# Patient Record
Sex: Male | Born: 1935 | Race: White | Hispanic: No | Marital: Married | Smoking: Never smoker
Health system: Southern US, Community
[De-identification: ages and names within clinical notes are randomized; demographics above are authoritative.]

## PROBLEM LIST (undated history)

## (undated) DIAGNOSIS — E785 Hyperlipidemia, unspecified: Secondary | ICD-10-CM

## (undated) DIAGNOSIS — G459 Transient cerebral ischemic attack, unspecified: Secondary | ICD-10-CM

## (undated) DIAGNOSIS — Z9081 Acquired absence of spleen: Secondary | ICD-10-CM

## (undated) DIAGNOSIS — F419 Anxiety disorder, unspecified: Secondary | ICD-10-CM

## (undated) DIAGNOSIS — I10 Essential (primary) hypertension: Secondary | ICD-10-CM

## (undated) DIAGNOSIS — D693 Immune thrombocytopenic purpura: Secondary | ICD-10-CM

## (undated) DIAGNOSIS — R1310 Dysphagia, unspecified: Secondary | ICD-10-CM

## (undated) DIAGNOSIS — I4821 Permanent atrial fibrillation: Secondary | ICD-10-CM

## (undated) DIAGNOSIS — C679 Malignant neoplasm of bladder, unspecified: Secondary | ICD-10-CM

## (undated) DIAGNOSIS — R310 Gross hematuria: Secondary | ICD-10-CM

## (undated) DIAGNOSIS — D649 Anemia, unspecified: Secondary | ICD-10-CM

## (undated) DIAGNOSIS — J4489 Other specified chronic obstructive pulmonary disease: Secondary | ICD-10-CM

## (undated) DIAGNOSIS — I639 Cerebral infarction, unspecified: Secondary | ICD-10-CM

## (undated) DIAGNOSIS — J90 Pleural effusion, not elsewhere classified: Secondary | ICD-10-CM

## (undated) DIAGNOSIS — J449 Chronic obstructive pulmonary disease, unspecified: Secondary | ICD-10-CM

## (undated) HISTORY — DX: Other specified chronic obstructive pulmonary disease: J44.89

## (undated) HISTORY — DX: Immune thrombocytopenic purpura: D69.3

## (undated) HISTORY — PX: BILATERAL VATS ABLATION: SHX1224

## (undated) HISTORY — DX: Malignant neoplasm of bladder, unspecified: C67.9

## (undated) HISTORY — PX: SPLENECTOMY: SUR1306

## (undated) HISTORY — DX: Pleural effusion, not elsewhere classified: J90

## (undated) HISTORY — DX: Acquired absence of spleen: Z90.81

## (undated) HISTORY — DX: Cerebral infarction, unspecified: I63.9

## (undated) HISTORY — DX: Chronic obstructive pulmonary disease, unspecified: J44.9

## (undated) HISTORY — PX: OTHER SURGICAL HISTORY: SHX169

---

## 1998-06-18 ENCOUNTER — Ambulatory Visit (HOSPITAL_BASED_OUTPATIENT_CLINIC_OR_DEPARTMENT_OTHER): Admission: RE | Admit: 1998-06-18 | Discharge: 1998-06-18 | Payer: Self-pay | Admitting: Plastic Surgery

## 1998-11-14 ENCOUNTER — Ambulatory Visit (HOSPITAL_BASED_OUTPATIENT_CLINIC_OR_DEPARTMENT_OTHER): Admission: RE | Admit: 1998-11-14 | Discharge: 1998-11-14 | Payer: Self-pay | Admitting: Urology

## 1998-12-23 ENCOUNTER — Ambulatory Visit (HOSPITAL_BASED_OUTPATIENT_CLINIC_OR_DEPARTMENT_OTHER): Admission: RE | Admit: 1998-12-23 | Discharge: 1998-12-23 | Payer: Self-pay | Admitting: Plastic Surgery

## 2000-08-04 ENCOUNTER — Encounter: Admission: RE | Admit: 2000-08-04 | Discharge: 2000-08-04 | Payer: Self-pay | Admitting: Urology

## 2000-08-04 ENCOUNTER — Encounter: Payer: Self-pay | Admitting: Urology

## 2000-08-05 ENCOUNTER — Ambulatory Visit (HOSPITAL_BASED_OUTPATIENT_CLINIC_OR_DEPARTMENT_OTHER): Admission: RE | Admit: 2000-08-05 | Discharge: 2000-08-05 | Payer: Self-pay | Admitting: Urology

## 2001-02-06 ENCOUNTER — Inpatient Hospital Stay (HOSPITAL_COMMUNITY): Admission: EM | Admit: 2001-02-06 | Discharge: 2001-02-08 | Payer: Self-pay | Admitting: Emergency Medicine

## 2001-02-13 ENCOUNTER — Ambulatory Visit (HOSPITAL_COMMUNITY): Admission: RE | Admit: 2001-02-13 | Discharge: 2001-02-13 | Payer: Self-pay | Admitting: Cardiology

## 2001-02-27 ENCOUNTER — Inpatient Hospital Stay (HOSPITAL_COMMUNITY): Admission: AD | Admit: 2001-02-27 | Discharge: 2001-03-02 | Payer: Self-pay | Admitting: Internal Medicine

## 2001-02-28 ENCOUNTER — Encounter: Payer: Self-pay | Admitting: Internal Medicine

## 2001-03-06 ENCOUNTER — Encounter (INDEPENDENT_AMBULATORY_CARE_PROVIDER_SITE_OTHER): Payer: Self-pay | Admitting: *Deleted

## 2001-03-06 ENCOUNTER — Encounter: Payer: Self-pay | Admitting: Pulmonary Disease

## 2001-03-06 ENCOUNTER — Inpatient Hospital Stay (HOSPITAL_COMMUNITY): Admission: AD | Admit: 2001-03-06 | Discharge: 2001-03-17 | Payer: Self-pay | Admitting: Critical Care Medicine

## 2001-03-07 ENCOUNTER — Encounter: Payer: Self-pay | Admitting: Pulmonary Disease

## 2001-03-08 ENCOUNTER — Encounter: Payer: Self-pay | Admitting: Pulmonary Disease

## 2001-03-09 ENCOUNTER — Encounter: Payer: Self-pay | Admitting: Pulmonary Disease

## 2001-03-10 ENCOUNTER — Encounter: Payer: Self-pay | Admitting: Critical Care Medicine

## 2001-03-10 ENCOUNTER — Encounter: Payer: Self-pay | Admitting: Thoracic Surgery

## 2001-03-11 ENCOUNTER — Encounter: Payer: Self-pay | Admitting: Thoracic Surgery

## 2001-03-12 ENCOUNTER — Encounter: Payer: Self-pay | Admitting: Pulmonary Disease

## 2001-03-13 ENCOUNTER — Encounter: Payer: Self-pay | Admitting: Pulmonary Disease

## 2001-03-14 ENCOUNTER — Encounter: Payer: Self-pay | Admitting: Thoracic Surgery

## 2001-03-15 ENCOUNTER — Encounter: Payer: Self-pay | Admitting: Critical Care Medicine

## 2001-03-16 ENCOUNTER — Encounter: Payer: Self-pay | Admitting: Critical Care Medicine

## 2001-03-16 ENCOUNTER — Encounter: Payer: Self-pay | Admitting: Pulmonary Disease

## 2001-03-17 ENCOUNTER — Encounter: Payer: Self-pay | Admitting: Thoracic Surgery

## 2001-03-22 ENCOUNTER — Encounter: Admission: RE | Admit: 2001-03-22 | Discharge: 2001-03-22 | Payer: Self-pay | Admitting: Thoracic Surgery

## 2001-03-22 ENCOUNTER — Encounter: Payer: Self-pay | Admitting: Thoracic Surgery

## 2001-04-05 ENCOUNTER — Encounter: Admission: RE | Admit: 2001-04-05 | Discharge: 2001-04-05 | Payer: Self-pay | Admitting: Thoracic Surgery

## 2001-04-05 ENCOUNTER — Encounter: Payer: Self-pay | Admitting: Thoracic Surgery

## 2001-05-03 ENCOUNTER — Encounter: Payer: Self-pay | Admitting: Thoracic Surgery

## 2001-05-03 ENCOUNTER — Encounter: Admission: RE | Admit: 2001-05-03 | Discharge: 2001-05-03 | Payer: Self-pay | Admitting: Thoracic Surgery

## 2001-08-01 ENCOUNTER — Encounter: Admission: RE | Admit: 2001-08-01 | Discharge: 2001-08-01 | Payer: Self-pay | Admitting: Thoracic Surgery

## 2001-08-01 ENCOUNTER — Encounter: Payer: Self-pay | Admitting: Thoracic Surgery

## 2001-08-04 ENCOUNTER — Ambulatory Visit (HOSPITAL_COMMUNITY): Admission: RE | Admit: 2001-08-04 | Discharge: 2001-08-04 | Payer: Self-pay | Admitting: Urology

## 2001-08-04 ENCOUNTER — Encounter (INDEPENDENT_AMBULATORY_CARE_PROVIDER_SITE_OTHER): Payer: Self-pay | Admitting: Specialist

## 2002-08-01 ENCOUNTER — Encounter: Admission: RE | Admit: 2002-08-01 | Discharge: 2002-08-01 | Payer: Self-pay | Admitting: Thoracic Surgery

## 2002-08-01 ENCOUNTER — Encounter: Payer: Self-pay | Admitting: Thoracic Surgery

## 2003-07-16 ENCOUNTER — Ambulatory Visit (HOSPITAL_BASED_OUTPATIENT_CLINIC_OR_DEPARTMENT_OTHER): Admission: RE | Admit: 2003-07-16 | Discharge: 2003-07-16 | Payer: Self-pay | Admitting: Urology

## 2003-07-16 ENCOUNTER — Encounter (INDEPENDENT_AMBULATORY_CARE_PROVIDER_SITE_OTHER): Payer: Self-pay

## 2004-10-27 ENCOUNTER — Ambulatory Visit: Payer: Self-pay

## 2004-11-25 ENCOUNTER — Ambulatory Visit: Payer: Self-pay | Admitting: Cardiology

## 2004-12-22 ENCOUNTER — Ambulatory Visit: Payer: Self-pay | Admitting: Cardiology

## 2005-01-19 ENCOUNTER — Ambulatory Visit: Payer: Self-pay | Admitting: Cardiology

## 2005-02-16 ENCOUNTER — Ambulatory Visit: Payer: Self-pay | Admitting: *Deleted

## 2005-03-16 ENCOUNTER — Ambulatory Visit: Payer: Self-pay | Admitting: Internal Medicine

## 2005-04-13 ENCOUNTER — Ambulatory Visit: Payer: Self-pay | Admitting: Cardiology

## 2005-05-10 ENCOUNTER — Ambulatory Visit: Payer: Self-pay | Admitting: Cardiology

## 2005-06-08 ENCOUNTER — Ambulatory Visit: Payer: Self-pay | Admitting: Cardiovascular Disease

## 2005-07-06 ENCOUNTER — Ambulatory Visit: Payer: Self-pay | Admitting: *Deleted

## 2005-08-03 ENCOUNTER — Ambulatory Visit: Payer: Self-pay | Admitting: Cardiology

## 2005-08-11 ENCOUNTER — Ambulatory Visit: Payer: Self-pay | Admitting: Cardiology

## 2005-08-11 ENCOUNTER — Ambulatory Visit: Payer: Self-pay | Admitting: Internal Medicine

## 2005-08-26 ENCOUNTER — Ambulatory Visit: Payer: Self-pay

## 2005-09-07 ENCOUNTER — Ambulatory Visit: Payer: Self-pay | Admitting: *Deleted

## 2005-09-13 ENCOUNTER — Ambulatory Visit: Payer: Self-pay | Admitting: Internal Medicine

## 2005-10-07 ENCOUNTER — Ambulatory Visit: Payer: Self-pay | Admitting: Cardiology

## 2005-11-02 ENCOUNTER — Ambulatory Visit: Payer: Self-pay | Admitting: Cardiology

## 2005-11-30 ENCOUNTER — Ambulatory Visit: Payer: Self-pay | Admitting: Cardiology

## 2005-12-28 ENCOUNTER — Ambulatory Visit: Payer: Self-pay | Admitting: *Deleted

## 2006-01-25 ENCOUNTER — Ambulatory Visit: Payer: Self-pay | Admitting: Cardiology

## 2006-02-22 ENCOUNTER — Ambulatory Visit: Payer: Self-pay | Admitting: Cardiovascular Disease

## 2006-03-15 ENCOUNTER — Ambulatory Visit: Payer: Self-pay | Admitting: Internal Medicine

## 2006-04-12 ENCOUNTER — Ambulatory Visit: Payer: Self-pay | Admitting: Cardiology

## 2006-05-06 ENCOUNTER — Encounter (INDEPENDENT_AMBULATORY_CARE_PROVIDER_SITE_OTHER): Payer: Self-pay | Admitting: Specialist

## 2006-05-06 ENCOUNTER — Ambulatory Visit (HOSPITAL_BASED_OUTPATIENT_CLINIC_OR_DEPARTMENT_OTHER): Admission: RE | Admit: 2006-05-06 | Discharge: 2006-05-06 | Payer: Self-pay | Admitting: Urology

## 2006-05-10 ENCOUNTER — Ambulatory Visit: Payer: Self-pay | Admitting: Internal Medicine

## 2006-05-10 ENCOUNTER — Inpatient Hospital Stay (HOSPITAL_COMMUNITY): Admission: EM | Admit: 2006-05-10 | Discharge: 2006-05-14 | Payer: Self-pay | Admitting: Emergency Medicine

## 2006-05-17 ENCOUNTER — Ambulatory Visit: Payer: Self-pay | Admitting: *Deleted

## 2006-05-31 ENCOUNTER — Ambulatory Visit: Payer: Self-pay | Admitting: Internal Medicine

## 2006-06-14 ENCOUNTER — Ambulatory Visit: Payer: Self-pay | Admitting: Cardiology

## 2006-06-18 ENCOUNTER — Emergency Department (HOSPITAL_COMMUNITY): Admission: EM | Admit: 2006-06-18 | Discharge: 2006-06-18 | Payer: Self-pay | Admitting: Family Medicine

## 2006-06-20 ENCOUNTER — Ambulatory Visit: Payer: Self-pay | Admitting: Internal Medicine

## 2006-06-22 ENCOUNTER — Encounter (HOSPITAL_COMMUNITY): Admission: RE | Admit: 2006-06-22 | Discharge: 2006-09-20 | Payer: Self-pay | Admitting: Urology

## 2006-07-12 ENCOUNTER — Ambulatory Visit: Payer: Self-pay | Admitting: Cardiovascular Disease

## 2006-08-02 ENCOUNTER — Ambulatory Visit: Payer: Self-pay | Admitting: Cardiology

## 2006-08-25 ENCOUNTER — Ambulatory Visit: Payer: Self-pay | Admitting: Cardiology

## 2006-09-20 ENCOUNTER — Ambulatory Visit: Payer: Self-pay | Admitting: Cardiovascular Disease

## 2006-10-18 ENCOUNTER — Ambulatory Visit: Payer: Self-pay | Admitting: Cardiology

## 2006-11-15 ENCOUNTER — Ambulatory Visit: Payer: Self-pay | Admitting: Internal Medicine

## 2006-12-13 ENCOUNTER — Ambulatory Visit: Payer: Self-pay | Admitting: Cardiology

## 2007-01-10 ENCOUNTER — Ambulatory Visit: Payer: Self-pay | Admitting: Cardiology

## 2007-02-07 ENCOUNTER — Ambulatory Visit: Payer: Self-pay | Admitting: Cardiology

## 2007-02-28 ENCOUNTER — Ambulatory Visit: Payer: Self-pay | Admitting: Cardiology

## 2007-03-28 ENCOUNTER — Ambulatory Visit: Payer: Self-pay | Admitting: Internal Medicine

## 2007-04-25 ENCOUNTER — Ambulatory Visit: Payer: Self-pay | Admitting: Cardiology

## 2007-05-16 ENCOUNTER — Ambulatory Visit: Payer: Self-pay | Admitting: Cardiology

## 2007-06-13 ENCOUNTER — Ambulatory Visit: Payer: Self-pay | Admitting: Internal Medicine

## 2007-07-11 ENCOUNTER — Ambulatory Visit: Payer: Self-pay | Admitting: Cardiology

## 2007-08-03 ENCOUNTER — Ambulatory Visit: Payer: Self-pay | Admitting: Internal Medicine

## 2007-08-29 ENCOUNTER — Ambulatory Visit: Payer: Self-pay | Admitting: Internal Medicine

## 2007-09-20 ENCOUNTER — Emergency Department (HOSPITAL_COMMUNITY): Admission: EM | Admit: 2007-09-20 | Discharge: 2007-09-20 | Payer: Self-pay | Admitting: Family Medicine

## 2007-09-26 ENCOUNTER — Ambulatory Visit: Payer: Self-pay | Admitting: Cardiology

## 2007-10-24 ENCOUNTER — Ambulatory Visit: Payer: Self-pay | Admitting: Cardiology

## 2007-11-21 ENCOUNTER — Ambulatory Visit: Payer: Self-pay | Admitting: Internal Medicine

## 2007-12-19 ENCOUNTER — Ambulatory Visit: Payer: Self-pay | Admitting: Cardiology

## 2008-01-18 ENCOUNTER — Ambulatory Visit: Payer: Self-pay | Admitting: Cardiology

## 2008-02-13 ENCOUNTER — Ambulatory Visit: Payer: Self-pay | Admitting: Cardiology

## 2008-02-23 ENCOUNTER — Encounter (INDEPENDENT_AMBULATORY_CARE_PROVIDER_SITE_OTHER): Payer: Self-pay | Admitting: Urology

## 2008-02-23 ENCOUNTER — Ambulatory Visit (HOSPITAL_BASED_OUTPATIENT_CLINIC_OR_DEPARTMENT_OTHER): Admission: RE | Admit: 2008-02-23 | Discharge: 2008-02-23 | Payer: Self-pay | Admitting: Urology

## 2008-03-06 ENCOUNTER — Ambulatory Visit: Payer: Self-pay | Admitting: Cardiology

## 2008-04-02 ENCOUNTER — Ambulatory Visit: Payer: Self-pay | Admitting: Cardiology

## 2008-04-30 ENCOUNTER — Ambulatory Visit: Payer: Self-pay | Admitting: Cardiovascular Disease

## 2008-05-28 ENCOUNTER — Ambulatory Visit: Payer: Self-pay | Admitting: Cardiovascular Disease

## 2008-06-13 ENCOUNTER — Ambulatory Visit: Payer: Self-pay | Admitting: Cardiology

## 2008-06-23 ENCOUNTER — Inpatient Hospital Stay (HOSPITAL_COMMUNITY): Admission: EM | Admit: 2008-06-23 | Discharge: 2008-06-24 | Payer: Self-pay | Admitting: Family Medicine

## 2008-06-23 ENCOUNTER — Ambulatory Visit: Payer: Self-pay | Admitting: Cardiology

## 2008-06-24 ENCOUNTER — Encounter: Payer: Self-pay | Admitting: Cardiology

## 2008-07-02 ENCOUNTER — Ambulatory Visit: Payer: Self-pay | Admitting: Cardiology

## 2008-07-25 ENCOUNTER — Ambulatory Visit: Payer: Self-pay | Admitting: Internal Medicine

## 2008-08-20 ENCOUNTER — Ambulatory Visit: Payer: Self-pay | Admitting: Cardiology

## 2009-02-24 ENCOUNTER — Encounter: Payer: Self-pay | Admitting: Internal Medicine

## 2009-03-31 ENCOUNTER — Ambulatory Visit: Payer: Self-pay | Admitting: Cardiovascular Disease

## 2009-03-31 ENCOUNTER — Inpatient Hospital Stay (HOSPITAL_COMMUNITY): Admission: EM | Admit: 2009-03-31 | Discharge: 2009-04-04 | Payer: Self-pay | Admitting: Emergency Medicine

## 2009-03-31 ENCOUNTER — Ambulatory Visit: Payer: Self-pay | Admitting: Internal Medicine

## 2009-04-01 ENCOUNTER — Encounter (INDEPENDENT_AMBULATORY_CARE_PROVIDER_SITE_OTHER): Payer: Self-pay | Admitting: Internal Medicine

## 2009-04-04 ENCOUNTER — Encounter: Payer: Self-pay | Admitting: Internal Medicine

## 2009-04-04 ENCOUNTER — Telehealth (INDEPENDENT_AMBULATORY_CARE_PROVIDER_SITE_OTHER): Payer: Self-pay | Admitting: *Deleted

## 2009-04-10 ENCOUNTER — Ambulatory Visit: Payer: Self-pay | Admitting: Internal Medicine

## 2009-04-10 DIAGNOSIS — C679 Malignant neoplasm of bladder, unspecified: Secondary | ICD-10-CM | POA: Insufficient documentation

## 2009-04-10 DIAGNOSIS — D693 Immune thrombocytopenic purpura: Secondary | ICD-10-CM | POA: Insufficient documentation

## 2009-04-10 DIAGNOSIS — J449 Chronic obstructive pulmonary disease, unspecified: Secondary | ICD-10-CM

## 2009-04-10 DIAGNOSIS — D649 Anemia, unspecified: Secondary | ICD-10-CM

## 2009-04-10 DIAGNOSIS — Z8679 Personal history of other diseases of the circulatory system: Secondary | ICD-10-CM | POA: Insufficient documentation

## 2009-04-10 DIAGNOSIS — I10 Essential (primary) hypertension: Secondary | ICD-10-CM | POA: Insufficient documentation

## 2009-04-10 DIAGNOSIS — F411 Generalized anxiety disorder: Secondary | ICD-10-CM | POA: Insufficient documentation

## 2009-04-10 DIAGNOSIS — J4489 Other specified chronic obstructive pulmonary disease: Secondary | ICD-10-CM | POA: Insufficient documentation

## 2009-04-10 DIAGNOSIS — E785 Hyperlipidemia, unspecified: Secondary | ICD-10-CM | POA: Insufficient documentation

## 2009-04-10 HISTORY — DX: Anemia, unspecified: D64.9

## 2009-11-10 ENCOUNTER — Encounter: Payer: Self-pay | Admitting: Internal Medicine

## 2011-02-09 ENCOUNTER — Ambulatory Visit (HOSPITAL_BASED_OUTPATIENT_CLINIC_OR_DEPARTMENT_OTHER)
Admission: RE | Admit: 2011-02-09 | Discharge: 2011-02-09 | Disposition: A | Discharge status: Home / Self Care | Payer: MEDICARE | Source: Ambulatory Visit | Attending: Orthopedic Surgery | Admitting: Orthopedic Surgery

## 2011-02-09 DIAGNOSIS — S61209A Unspecified open wound of unspecified finger without damage to nail, initial encounter: Secondary | ICD-10-CM | POA: Insufficient documentation

## 2011-02-09 DIAGNOSIS — Z1833 Retained wood fragments: Secondary | ICD-10-CM | POA: Insufficient documentation

## 2011-02-16 NOTE — Op Note (Signed)
Leonard, [name] NO.:  [id number]  MEDICAL RECORD NO.:  [id number]          PATIENT TYPE:  LOCATION:                                 FACILITY:  PHYSICIAN:  [name]. [name], M.D.      DATE OF BIRTH:  DATE OF PROCEDURE:  02/09/2011 DATE OF DISCHARGE:                              OPERATIVE REPORT   PREOPERATIVE DIAGNOSIS:  Abscess, left index finger pulp due to wood splinter 1 week prior.  POSTOPERATIVE DIAGNOSIS:  Abscess, left index finger pulp due to wood splinter 1 week prior.  OPERATION: 1. Incision and drainage of pulp abscess, left index finger. 2. Removal of treated wood splinter and placement of a Xeroflo drain,     left index finger pulp.  OPERATING SURGEON:  [name]. Leonard Magnussen, MD.  ASSISTANT:  [name], PAC.  ANESTHESIA:  Lidocaine 2% metacarpal head level block, left index finger with 5 mL of plain 2% lidocaine.  This is a minor operating room procedure.  INDICATIONS:  The patient is a well-known gentleman, who was building a bridge with pressure treated wood at home.  While passing his hand over a rough cut of lumber, he got a large splinter into his left index finger pulp.  He tried to remove this, but was unsuccessful.  He subsequently developed significant swelling and pain and saw Dr. Venancio [name] of [facility].  Dr. Nicholas [name] numbed the finger, tried to remove the splinter.  Unfortunately, the patient continued to have significant pain, swelling, and signs of a probable abscess.  Therefore, Dr. Nicholas [name] contacted orthopedic and hand specialist and requested a consult.  The patient was seen on February 08, 2011 and noted to have the pulp abscess.  He had been treated with Keflex 500 mg p.o. q.8 h; however, he was not tolerating generic Keflex due to GI upset.  We advised him to hold the Keflex, begin soaks, and present for incision and drainage at this time with anticipated removal of the  splinter.  After informed consent, he was brought to the operating room.  PROCEDURE IN DETAIL:  The patient was brought to room 8 of the [facility] and placed in supine position on the operating table.  Following informed consent and alcohol Betadine prep, 2% lidocaine was infiltrated at the metacarpal head level to obtain a digital block.  After 5 minutes, excellent anesthesia was achieved.  The patient's left arm and hand were prepped with Betadine soap and solution, sterilely draped.  The finger was elevated and a gauze wrap was used for exsanguination.  A 1/2-inch Penrose drain was placed to the proximal phalangeal segment as a digital tourniquet.  Once anesthesia was confirmed to be satisfactorily, we performed a 2-cm midline incision in the pulp.  Immediately, pus under pressure was encountered.  We gently spread the pulp, relieving a sizable abscess extending down to the flexor tendon insertion.  I then identified a 12 mm x 2 mm x 1.5 mm wood splinter that appear to be pressure treated. This was removed.  The patient identified this as the that wood he was using.  I asked him if he thought he had more than one splinter.  He was not certain.  I diligently searched the pulp and could not find a second one.  I palpated with a [name] and also could not feel a foreign body.  Experienced surgeons understand that  foreign body identification particularly in the setting of an abscess can be very challenging.  To the best of our abilities, we have explored the fingertip.  The abscess cavity was then packed with Xeroflo, followed by [name] dressing, sterile gauze, and a Coban finger dressing and secured with a wrist wrap.  For aftercare, the patient is provided prescriptions for tramadol 50 mg 1-2 tablets p.o. q.4-6 hours p.r.[name]. pain, 20 tablets with one refill. Also he has doxycycline 100 mg p.o. b.i.d. x7 days.     [name] [name], M.D.     RVS/MEDQ  D:   02/09/2011  T:  02/10/2011  Job:  [id number]  cc:   Leonard Poisson, MD  Electronically Signed by [name] M.D. on 02/16/2011 08:23:03 AM

## 2011-03-15 ENCOUNTER — Other Ambulatory Visit: Payer: Self-pay | Admitting: Dermatology

## 2011-03-30 ENCOUNTER — Inpatient Hospital Stay (HOSPITAL_COMMUNITY)
Admission: EM | Admit: 2011-03-30 | Discharge: 2011-03-31 | DRG: 309 | Disposition: A | Discharge status: Home / Self Care | Payer: Medicare Other | Attending: Family Medicine | Admitting: Family Medicine

## 2011-03-30 ENCOUNTER — Inpatient Hospital Stay (INDEPENDENT_AMBULATORY_CARE_PROVIDER_SITE_OTHER)
Admission: RE | Admit: 2011-03-30 | Discharge: 2011-03-30 | Disposition: A | Discharge status: Home / Self Care | Payer: MEDICARE | Source: Ambulatory Visit

## 2011-03-30 ENCOUNTER — Emergency Department (HOSPITAL_COMMUNITY): Payer: Medicare Other

## 2011-03-30 DIAGNOSIS — I4891 Unspecified atrial fibrillation: Secondary | ICD-10-CM

## 2011-03-30 DIAGNOSIS — G459 Transient cerebral ischemic attack, unspecified: Secondary | ICD-10-CM | POA: Diagnosis present

## 2011-03-30 DIAGNOSIS — Z7901 Long term (current) use of anticoagulants: Secondary | ICD-10-CM

## 2011-03-30 DIAGNOSIS — R42 Dizziness and giddiness: Secondary | ICD-10-CM | POA: Diagnosis present

## 2011-03-30 DIAGNOSIS — R279 Unspecified lack of coordination: Secondary | ICD-10-CM | POA: Diagnosis present

## 2011-03-30 DIAGNOSIS — R4789 Other speech disturbances: Secondary | ICD-10-CM | POA: Diagnosis present

## 2011-03-30 DIAGNOSIS — C679 Malignant neoplasm of bladder, unspecified: Secondary | ICD-10-CM | POA: Diagnosis present

## 2011-03-30 DIAGNOSIS — Z8673 Personal history of transient ischemic attack (TIA), and cerebral infarction without residual deficits: Secondary | ICD-10-CM

## 2011-03-30 LAB — CBC
Hemoglobin: 13.3 g/dL (ref 13.0–17.0)
MCH: 34.3 pg — ABNORMAL HIGH (ref 26.0–34.0)
MCV: 98.5 fL (ref 78.0–100.0)
RBC: 3.88 MIL/uL — ABNORMAL LOW (ref 4.22–5.81)
RDW: 13.9 % (ref 11.5–15.5)

## 2011-03-30 LAB — DIFFERENTIAL
Basophils Absolute: 0 10*3/uL (ref 0.0–0.1)
Eosinophils Relative: 1 % (ref 0–5)
Lymphs Abs: 1.4 10*3/uL (ref 0.7–4.0)
Neutrophils Relative %: 68 % (ref 43–77)

## 2011-03-30 LAB — BASIC METABOLIC PANEL
GFR calc Af Amer: >60 mL/min (ref >60)
GFR calc non Af Amer: >60 mL/min (ref >60)
Sodium: 137 mEq/L (ref 135–145)

## 2011-03-30 LAB — APTT: aPTT: 33 seconds (ref 24–37)

## 2011-03-30 LAB — POCT CARDIAC MARKERS
CKMB, poc: 1.5 ng/mL (ref 1.0–8.0)
Troponin i, poc: <0.05 ng/mL (ref 0.00–0.09)

## 2011-03-30 LAB — CARDIAC PANEL(CRET KIN+CKTOT+MB+TROPI)
Relative Index: INVALID (ref 0.0–2.5)
Total CK: 52 U/L (ref 7–232)

## 2011-03-30 LAB — PROTIME-INR: Prothrombin Time: 22.7 seconds — ABNORMAL HIGH (ref 11.6–15.2)

## 2011-03-31 ENCOUNTER — Inpatient Hospital Stay (HOSPITAL_COMMUNITY): Payer: Medicare Other

## 2011-03-31 ENCOUNTER — Other Ambulatory Visit (HOSPITAL_COMMUNITY): Payer: MEDICARE

## 2011-03-31 DIAGNOSIS — I059 Rheumatic mitral valve disease, unspecified: Secondary | ICD-10-CM

## 2011-03-31 LAB — CARDIAC PANEL(CRET KIN+CKTOT+MB+TROPI)
Relative Index: INVALID (ref 0.0–2.5)
Relative Index: INVALID (ref 0.0–2.5)
Total CK: 47 U/L (ref 7–232)
Total CK: 40 U/L (ref 7–232)
Troponin I: 0.01 ng/mL (ref 0.00–0.06)

## 2011-03-31 LAB — BASIC METABOLIC PANEL
BUN: 18 mg/dL (ref 6–23)
CO2: 27 mEq/L (ref 19–32)
Chloride: 102 mEq/L (ref 96–112)
Creatinine, Ser: 1.09 mg/dL (ref 0.4–1.5)
Glucose, Bld: 87 mg/dL (ref 70–99)
Potassium: 4.1 mEq/L (ref 3.5–5.1)
Sodium: 137 mEq/L (ref 135–145)

## 2011-04-04 NOTE — Discharge Summary (Signed)
[name], [name] NO.:  [id number]  MEDICAL RECORD NO.:  [id number]           PATIENT TYPE:  I  LOCATION:  2032                         FACILITY:  [facility]  PHYSICIAN:  Lonia Blood, M.D.       DATE OF BIRTH:  03-02-1927  DATE OF ADMISSION:  03/30/2011 DATE OF DISCHARGE:  03/31/2011                              DISCHARGE SUMMARY   PRIMARY CARE PHYSICIAN:  Corwin Levins, MD  DISCHARGE DIAGNOSES: 1. Vertigo/transient ischemic attack. 2. History of remote lacunar strokes at least 3 of them. 3. Atrial fibrillation with rapid ventricular response. 4. Hypertension. 5. History of pleural effusion status post video-assisted     thoracoscopic surgery in 2002. 6. History of idiopathic thrombocytopenic purpura status post     splenectomy. 7. Bladder cancer on ongoing local therapy by [name] at [facility].  DISCHARGE MEDICATIONS: 1. Diltiazem 240 mg daily. 2. Coumadin as before. 3. Vitamin C 1 tablet daily. 4. Vitamin D 1000 units twice a day.  CONDITION ON DISCHARGE:  [name] was discharged in good condition. At the time of discharge, temperature 97.6, pulse 88, respirations 18, blood pressure 124/72, saturation 99% on room air.  He was alert, oriented, and able to ambulate around the room without assistance.  He was instructed to follow up with his primary care physician, [name].  I have recommended to the patient that he also follows up with Cardiology and [name] is going to help facilitate that visit.  PROCEDURE DURING THIS ADMISSION: 1. The patient underwent an MRI of the brain without contrast which     was negative for acute strokes, global atrophy and scattered small     vessel disease type changes, remote bilateral caudate, head and     superior left ventricular infarcts. 2. MRA of the brain showing the major intracranial vascular structures     are patent. 3. Carotid ultrasounds which  were negative for significant     extracranial carotid artery stenosis. 4. March 31, 2011, echocardiogram ejection fraction found to be 45-50%,     systolic artery pressures 39 mmHg.  HISTORY AND PHYSICAL:  Refer to dictated H and P done by Dr. Lonia Blood.  HOSPITAL COURSE:  [name] is an 77 year old gentleman with known hypertension, old strokes and atrial fibrillation presented to the emergency room with complaints of ataxia and vertigo.  He was also found to be in atrial fibrillation with rapid ventricular response to 140s. The patient was admitted to hospital on the telemetry unit and started on intravenous Cardizem drip.  He had frequent neurological checks and had an MRI of the brain which demonstrated that he has not suffered a new stroke.  The patient's vertigo improved after his atrial fibrillation and ventricular transmission was controlled.  On a Cardizem drip, the patient's heart rate got into the 80s.  He was  transitioned to oral 240 mg of diltiazem which he tolerated well with good blood pressure and good heart rate.  He was recommended to continue this dose of Cardizem and continue  taking Coumadin.  I have also recommended the patient consider switching to Xarelto and he also follows up with Cardiology.  Otherwise, the patient should follow up with his primary care physician for the other risk factor modification including a check of a hemoglobin A1c and fasting lipid panel.  Reviewing the records, it looks like he is overdue on both of those.     Lonia Blood, M.D.     SL/MEDQ  D:  04/01/2011  T:  04/01/2011  Job:  [id number]  cc:   Corwin Levins, MD  Electronically Signed by Lonia Blood M.D. on 04/04/2011 10:25:29 AM

## 2011-04-07 ENCOUNTER — Ambulatory Visit (INDEPENDENT_AMBULATORY_CARE_PROVIDER_SITE_OTHER): Payer: MEDICARE | Admitting: Internal Medicine

## 2011-04-07 ENCOUNTER — Encounter: Payer: Self-pay | Admitting: Internal Medicine

## 2011-04-07 DIAGNOSIS — J90 Pleural effusion, not elsewhere classified: Secondary | ICD-10-CM | POA: Insufficient documentation

## 2011-04-07 DIAGNOSIS — Z8679 Personal history of other diseases of the circulatory system: Secondary | ICD-10-CM

## 2011-04-07 DIAGNOSIS — Z9081 Acquired absence of spleen: Secondary | ICD-10-CM | POA: Insufficient documentation

## 2011-04-07 DIAGNOSIS — I6381 Other cerebral infarction due to occlusion or stenosis of small artery: Secondary | ICD-10-CM | POA: Insufficient documentation

## 2011-04-07 DIAGNOSIS — C679 Malignant neoplasm of bladder, unspecified: Secondary | ICD-10-CM

## 2011-04-07 DIAGNOSIS — I10 Essential (primary) hypertension: Secondary | ICD-10-CM

## 2011-04-07 DIAGNOSIS — I4891 Unspecified atrial fibrillation: Secondary | ICD-10-CM

## 2011-04-07 DIAGNOSIS — Z7901 Long term (current) use of anticoagulants: Secondary | ICD-10-CM | POA: Insufficient documentation

## 2011-04-07 HISTORY — DX: Acquired absence of spleen: Z90.81

## 2011-04-07 LAB — SODIUM, URINE, RANDOM: Sodium, Ur: 28 mEq/L

## 2011-04-07 LAB — COMPREHENSIVE METABOLIC PANEL
AST: 22 U/L (ref 0–37)
CO2: 25 mEq/L (ref 19–32)
Calcium: 7.7 mg/dL — ABNORMAL LOW (ref 8.4–10.5)
Creatinine, Ser: 1.13 mg/dL (ref 0.4–1.5)
GFR calc Af Amer: >60 mL/min (ref >60)
GFR calc non Af Amer: >60 mL/min (ref >60)
Total Protein: 5.5 g/dL — ABNORMAL LOW (ref 6.0–8.3)

## 2011-04-07 LAB — BASIC METABOLIC PANEL
BUN: 19 mg/dL (ref 6–23)
Chloride: 103 mEq/L (ref 96–112)
Glucose, Bld: 96 mg/dL (ref 70–99)
Potassium: 4.4 mEq/L (ref 3.5–5.1)

## 2011-04-07 LAB — PROTIME-INR
INR: 2.3 — ABNORMAL HIGH (ref 0.00–1.49)
INR: 2.5 — ABNORMAL HIGH (ref 0.00–1.49)
INR: 2.6 — ABNORMAL HIGH (ref 0.00–1.49)
INR: 3.2 — ABNORMAL HIGH (ref 0.00–1.49)
INR: 3.5 — ABNORMAL HIGH (ref 0.00–1.49)
Prothrombin Time: 27.1 s — ABNORMAL HIGH (ref 11.6–15.2)
Prothrombin Time: 28.4 s — ABNORMAL HIGH (ref 11.6–15.2)
Prothrombin Time: 29.9 s — ABNORMAL HIGH (ref 11.6–15.2)
Prothrombin Time: 38.1 s — ABNORMAL HIGH (ref 11.6–15.2)

## 2011-04-07 LAB — CBC
HCT: 32.1 % — ABNORMAL LOW (ref 39.0–52.0)
HCT: 34 % — ABNORMAL LOW (ref 39.0–52.0)
Hemoglobin: 10.9 g/dL — ABNORMAL LOW (ref 13.0–17.0)
MCHC: 33.6 g/dL (ref 30.0–36.0)
MCV: 101.7 fL — ABNORMAL HIGH (ref 78.0–100.0)
MCV: 102.9 fL — ABNORMAL HIGH (ref 78.0–100.0)
Platelets: 194 10*3/uL (ref 150–400)
Platelets: 206 10*3/uL (ref 150–400)
RBC: 3.16 MIL/uL — ABNORMAL LOW (ref 4.22–5.81)
RBC: 4.17 MIL/uL — ABNORMAL LOW (ref 4.22–5.81)
RDW: 13.9 % (ref 11.5–15.5)
WBC: 13.8 10*3/uL — ABNORMAL HIGH (ref 4.0–10.5)

## 2011-04-07 LAB — DIFFERENTIAL
Basophils Absolute: 0 10*3/uL (ref 0.0–0.1)
Basophils Relative: 0 % (ref 0–1)
Eosinophils Absolute: 0 10*3/uL (ref 0.0–0.7)
Eosinophils Absolute: 0 10*3/uL (ref 0.0–0.7)
Lymphs Abs: 0.5 10*3/uL — ABNORMAL LOW (ref 0.7–4.0)
Lymphs Abs: 0.6 10*3/uL — ABNORMAL LOW (ref 0.7–4.0)
Neutro Abs: 12.5 10*3/uL — ABNORMAL HIGH (ref 1.7–7.7)
Neutro Abs: 9 10*3/uL — ABNORMAL HIGH (ref 1.7–7.7)
Neutrophils Relative %: 90 % — ABNORMAL HIGH (ref 43–77)

## 2011-04-07 LAB — POCT I-STAT, CHEM 8
Calcium, Ion: 0.87 mmol/L — ABNORMAL LOW (ref 1.12–1.32)
Creatinine, Ser: 1.4 mg/dL (ref 0.4–1.5)
Glucose, Bld: 103 mg/dL — ABNORMAL HIGH (ref 70–99)
Hemoglobin: 16 g/dL (ref 13.0–17.0)
Potassium: 4.9 mEq/L (ref 3.5–5.1)
TCO2: 26 mmol/L (ref 0–100)

## 2011-04-07 LAB — EXPECTORATED SPUTUM ASSESSMENT W GRAM STAIN, RFLX TO RESP C

## 2011-04-07 LAB — CULTURE, BLOOD (ROUTINE X 2): Culture: NO GROWTH

## 2011-04-07 LAB — TROPONIN I: Troponin I: <0.01 ng/mL (ref 0.00–0.06)

## 2011-04-07 LAB — LIPID PANEL
LDL Cholesterol: 52 mg/dL (ref 0–99)
Triglycerides: 34 mg/dL (ref <150)
VLDL: 7 mg/dL (ref 0–40)

## 2011-04-07 LAB — URINALYSIS, ROUTINE W REFLEX MICROSCOPIC
Leukocytes, UA: NEGATIVE
Nitrite: NEGATIVE
Protein, ur: 30 mg/dL — AB
Specific Gravity, Urine: 1.02 (ref 1.005–1.030)
Urobilinogen, UA: 1 mg/dL (ref 0.0–1.0)

## 2011-04-07 LAB — CULTURE, RESPIRATORY W GRAM STAIN: Culture: NORMAL

## 2011-04-07 LAB — CARDIAC PANEL(CRET KIN+CKTOT+MB+TROPI)
CK, MB: 0.8 ng/mL (ref 0.3–4.0)
CK, MB: 1 ng/mL (ref 0.3–4.0)
Relative Index: INVALID (ref 0.0–2.5)
Relative Index: INVALID (ref 0.0–2.5)
Total CK: 30 U/L (ref 7–232)
Total CK: 35 U/L (ref 7–232)

## 2011-04-07 LAB — CK TOTAL AND CKMB (NOT AT ARMC)
Relative Index: INVALID (ref 0.0–2.5)
Total CK: 34 U/L (ref 7–232)

## 2011-04-07 LAB — POCT CARDIAC MARKERS: CKMB, poc: 1.3 ng/mL (ref 1.0–8.0)

## 2011-04-07 LAB — LEGIONELLA ANTIGEN, URINE

## 2011-04-07 LAB — URINE MICROSCOPIC-ADD ON

## 2011-04-07 LAB — CREATININE, URINE, RANDOM: Creatinine, Urine: 113.5 mg/dL

## 2011-04-07 NOTE — Assessment & Plan Note (Signed)
Also for referral to coumadin clinic  [place]

## 2011-04-07 NOTE — Assessment & Plan Note (Signed)
stable overall by hx and exam, most recent lab reviewed with pt, and pt to continue medical treatment as before, though I think he may benefit from anti-plt in addition to coumadin, Continue all other medications as before for now

## 2011-04-07 NOTE — Patient Instructions (Signed)
Continue all other medications as before You will be contacted regarding the referral for: coumadin clinic, and Cardiology (Dr Mariah Milling) at [facility]

## 2011-04-07 NOTE — Assessment & Plan Note (Signed)
Currently stable HR and BP, tolerating med well, volume status stable, and will refer to card as reqeusted, Continue all other medications as before

## 2011-04-07 NOTE — Assessment & Plan Note (Signed)
stable overall by hx and exam, most recent lab reviewed with pt, and pt to continue medical treatment as before  BP Readings from Last 3 Encounters:  04/07/11 100/70  04/10/09 124/62

## 2011-04-07 NOTE — Progress Notes (Signed)
Subjective:    Patient ID: [name], male    DOB: August 19, 1927, 77 y.o.   MRN: [id number]  HPI 77 yo WM last seen approx 18 mo ago, here with wife to f/u post hospn for afib/rvr with diltiazem increased from 120 to 240 mg;  Vertigo/TIA and MIR c/w several remote lacunar infarcts, overall doing well today;  He is very wary of continued use of the dilt at 240 mg, b/c at one point he had 180 reduced per Dr Graciela Husbands to 120 mg for ? Side effect, but seems to be tolerating the 240 mg well, in fact HR and BP at home daily per wife have been normal;  Pt without further dizziness/vertigo and back to baseline activity level and speaks of building a wooden bridge across a stream on his property;  Pt denies chest pain, increased sob or doe, wheezing, orthopnea, PND, increased LE swelling, palpitations, dizziness or syncope.  Pt denies new neurological symptoms such as new headache, or facial or extremity weakness or numbness   Pt denies polydipsia, polyuria.  Denies worsening depressive symptoms, suicidal ideation, or panic.  Overall good compliance with treatment, and good medicine tolerability.  Pt denies fever, wt loss, night sweats, loss of appetite, or other constitutional symptoms  Wife mentions he needs assist with referral to new cardiologist and coumadin clinic, preferably in [place] as they live in [place].  Also mentions he plans to cont to f/u with Dr Darvin Neighbours [facility] urology (has seen for over 20 yrs);  Had been planned for recurrent bladder cancer surgury, but wife called to put this off for now, until can be cleared by cardiology.  He has several bladder cysto/surgury and has always come off coumadin 3 days ahead, then resumed usual coumadin post-procedure the same day.  Has had no clinical cva symptoms to account for the lacunar infarcts found on MRI, timing of infarcts unknown if occurred while off the coumadin. Past Medical History  Diagnosis Date  . NEOPLASM, MALIGNANT, BLADDER 04/10/2009  .  HYPERLIPIDEMIA 04/10/2009  . ANEMIA-NOS 04/10/2009  . 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 ANXIETY 04/10/2009  . HYPERTENSION 04/10/2009  . Atrial fibrillation 04/10/2009  . COPD 04/10/2009  . TRANSIENT ISCHEMIC ATTACK, HX OF 04/10/2009  . Bladder cancer     Dr. Darvin Neighbours  . Lacunar stroke 04/07/2011  . Pleural effusion 04/07/2011  . 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 Splenectomy   . Bilateral vats ablation   . Facial cancer     facial skin cancer    reports that he has never smoked. He does not have any smokeless tobacco history on file. He reports that he does not drink alcohol. His drug history not on file. family history includes Arthritis in his father and Heart disease in his mother. Allergies  Allergen Reactions  . Diazepam     REACTION: agitation  . Ezetimibe-Simvastatin   . Morphine   . Sulfonamide Derivatives     REACTION: rash   Current Outpatient Prescriptions on File Prior to Visit  Medication Sig Dispense Refill  . Alum & Mag Hydroxide-Simeth (MAGIC MOUTHWASH) SOLN Take by mouth. Swish and spit 5 cc by mouth four times daily       . warfarin (COUMADIN) 2.5 MG tablet Take 2.5 mg by mouth. Use as directed       . DISCONTD: diltiazem (DILACOR XR) 120 MG 24 hr capsule Take 120 mg by mouth daily.  Review of Systems Review of Systems  Constitutional: Negative for diaphoresis and unexpected weight change.  HENT: Negative for drooling and tinnitus.   Eyes: Negative for photophobia and visual disturbance.  Respiratory: Negative for choking and stridor.   Gastrointestinal: Negative for vomiting and blood in stool.  Genitourinary: Negative for hematuria and decreased urine volume.  Musculoskeletal: Negative for gait problem.  Skin: Negative for color change and wound.  Neurological: Negative for tremors and numbness.  Psychiatric/Behavioral: Negative for decreased concentration. The patient is not hyperactive.         Objective:   Physical ExamBP 100/70  Pulse 75  Temp(Src) 97.5 F (36.4 C) (Oral)  Ht 5\' 8"  (1.727 m)  Wt 134 lb 4 oz (60.895 kg)  BMI 20.41 kg/m2  SpO2 98% Physical Exam  VS noted Constitutional: Pt appears well-developed and well-nourished.  HENT: Head: Normocephalic.  Right Ear: External ear normal.  Left Ear: External ear normal.  Eyes: Conjunctivae and EOM are normal. Pupils are equal, round, and reactive to light.  Neck: Normal range of motion. Neck supple.  Cardiovascular: Normal rate and regular rhythm.   Pulmonary/Chest: Effort normal and breath sounds normal.  Abd:  Soft, NT, non-distended, + BS Neurological: Pt is alert. No cranial nerve deficit.  Skin: Skin is warm. No erythema.  Psychiatric: Pt behavior is normal. Thought content normal.  1+ nervous         Assessment & Plan:

## 2011-04-07 NOTE — Assessment & Plan Note (Signed)
Agree pt should hold for a few wks anyway further attempt at bladder surgury, and with next procedure I suggested he consider bridge therapy with lovenox, though he is not wanting to consider this for now

## 2011-04-12 NOTE — H&P (Addendum)
NAMEIGNATZ, DEIS NO.:  [id number]  MEDICAL RECORD NO.:  [id number]           PATIENT TYPE:  LOCATION:                                 FACILITY:  PHYSICIAN:  Lonia Blood, M.D.       DATE OF BIRTH:  [birth year]  DATE OF ADMISSION:  03/30/2011 DATE OF DISCHARGE:                             HISTORY & PHYSICAL   PRIMARY CARE PROVIDER:  Dr. Oliver Barre of [place].  CARDIOLOGIST:  Duke Salvia, MD, FACC  NEUROLOGIST:  Pramod P. Pearlean Brownie, MD  UROLOGIST:  Lucrezia Starch. Earlene Plater, MD  The patient is being admitted to [facility] Team #5.  CHIEF COMPLAINT:  Vertigo.  HISTORY OF PRESENT ILLNESS:  [name] is a very pleasant 77 year old male with a history of AFib, hypertension, bladder cancer, TIA, who presents to the [facility] ED with chief complaint of vertigo.  Information is obtained from the patient.  He states that last 7 days or so, he developed intermittent vertigo.  He reports that typically he would awaken and feel fine and as the day progressed he developed intermittent vertigo that was worse at night.  He states that he would lie down and the room would be spinning.  He would lie still until it stopped.  He did not have trouble sleeping and when he awakened he felt fine.  This has been going on for the last week or so until yesterday, the vertigo worsened to the point that he was unable to ambulate.  He states that he saw his cardiologist yesterday for a routine followup at which point assessment of his ears was done and the ear exam was benign.  He denies headache, visual disturbances, numbness, tingling of extremities.  He denies chest pain, palpitation, change in mentation, difficulty swallowing, or slurred speech.  He denies weakness in his lower extremities.  He indicates that he was just unable to ambulate due to the vertigo.  This morning, he called his PCP who recommended that he come to the emergency room.  Workup in the emergency room  yields the patient in AFib with rapid ventricular response at a rate of 148.  A Cardizem drip was started in the emergency room.  Symptoms came on gradually, have persisted and worsened.  Movement makes symptoms worse. Lying still makes them better.  Symptoms are characterized as moderate. We are asked to admit for further evaluation and treatment.  ALLERGIES:  The patient indicates an allergy to: 1. SULFA, causes hives. 2. VALIUM causes the patient to climb the walls. 3. MORPHINE causes hallucinations. 4. VYTORIN intolerance in the past.  PAST MEDICAL HISTORY: 1. History of chronic AFib/flutter status post TEE with cardioversion     at least 4 times in the past. 2. History of pleural effusion status post thoracentesis in 2002     complicated by pneumothorax, resolved with the chest tube. 3. 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 Edwyna Shell. 4. History of TIA in 2007. 5. Hypertension. 6. History of idiopathic thrombocytopenic purpura status post     splenectomy. 7. History of bladder cancer with multiple procedures.  The patient     also reports being treated with interferon.  Has an appointment     with Dr. Earlene Plater next week.  MEDICATIONS: 1. Vitamin C over-the-counter p.o. 1 tablet daily. 2. Vitamin D3 2000 units 0.5 tablets p.o. b.i.d. 3. Coumadin 5 mg p.o. 0.5-1 tablet daily, 1 tablet on Monday and 0.5     tablets all the other days. 4. Diltiazem CD/XT 120 mg p.o. daily.  FAMILY HISTORY:  Reviewed with the patient.  Noncontributory to the admission of this elderly gentleman.  SOCIAL HISTORY:  The patient is married and lives with his wife and has done so for the last 52 years.  He is retired from [employer]. He denies tobacco use.  Denies EtOH.  Denies drug use.  REVIEW OF SYSTEMS:  GENERAL:  Negative for fever, chills, anorexia, unintentional weight loss.  ENT:  Negative for ear pain, nasal congestion, sore throat.  CV:  Negative for chest pain,  palpitation, lower extremity edema.  RESPIRATORY:  Negative for increased work of breathing or cough.  MUSCULOSKELETAL:  Negative for joint pain, muscle weakness.  NEURO:  See HPI.  GI:  Negative for nausea, vomiting, abdominal pain, constipation, diarrhea, melena.  GU:  Positive for frequency.  Negative for dysuria, hematuria.  PSYCH:  Negative for depression, anxiety.  HEME:  Negative for unusual bruising or bleeding.  LABORATORY DATA:  WBC is 6.5, hemoglobin 13.3, hematocrit 38.2, platelets 139.  Sodium 137, potassium 4.2, chloride 100, CO2 of 28, BUN 25, creatinine 1.07, glucose 92.  PTT 33, PT 22.7, INR 1.98.  Total CK 53, CK-MB 2.0, troponin-I 0.01, second set is CK-MB 1.5, troponin-I less than 0.05, myoglobin 59.3. Radiology, chest x-ray yields COPD.  No active lung disease. CT of the head, no acute abnormality.  Atrophy, chronic small vessel white matter ischemic changes and old lacunar infarcts. EKG yields AFib.  PHYSICAL EXAMINATION:  VITAL SIGNS:  Temperature is 98.0, blood pressure 124/77, heart rate 90, respiration 18, sats 95% on room air. GENERAL:  Awake, alert, well-nourished, well-hydrated, no acute distress. HEENT:  Head normocephalic, atraumatic.  Pupils equal, round, reactive to light.  EOMI.  Mucous membranes of his mouth are moist and pink.  No obvious lesion or exudate in his nose or ears. NECK:  Supple.  No JVD.  Full range of motion.  No lymphadenopathy. CV:  Irregularly irregular rhythm.  No murmur, gallop, or rub.  No lower extremity edema.  Pedal pulses present and palpable. RESPIRATORY:  No increased work of breathing.  Breath sounds clear to auscultation bilaterally.  No rhonchi, wheezes, or rales. ABDOMEN:  Flat, soft, positive bowel sounds throughout, nontender to palpation.  No mass or organomegaly noted. NEURO:  Alert and oriented x3.  Speech clear.  Facial symmetry.  Cranial nerves II-XII grossly intact. MUSCULOSKELETAL:  Moves all extremities.  No  joint swelling/erythema. EXTREMITIES:  Without clubbing or cyanosis.  ASSESSMENT AND PLAN: 1. Vertigo, etiology unclear.  Nonspecific peripheral versus central.     We will admit to tele.  We will get MRI of brain.  We will request     PT consult.  If above ruled out, may need to consider vestibular     neuritis and to give prednisone. 2. Atrial fibrillation with rapid ventricular response, likely     exacerbated by vertigo.  Has been on Cardizem for 10 years and     chronic Coumadin.  We will continue Cardizem drip started in the ED     once rate  controlled.  We will convert to p.o.  We will add a beta-     blocker and gently fluid resuscitate.  We will ask pharmacy to dose     Coumadin. 3. Hypertension.  Blood pressure is currently 142/94.  We will add a     beta-blocker and monitor closely. 4. History of transient ischemic attack.  We will get an MRI of the     brain and 2-D echo and give aspirin. 5. History of bladder cancer.  The patient has a followup appointment     with Dr. Earlene Plater in 2 weeks. 6. History of idiopathic thrombocytic purpura.  Currently at baseline. 7. Deep vein thrombosis prophylaxis.  The patient is on Coumadin. 8. Code status.  The patient is a full code.  This assessment and plan was discussed with Dr. Lavera Guise.  It was truly a pleasure taking care of [name].     Gwenyth Bender, NP   ______________________________ Lonia Blood, M.D.    KMB/MEDQ  D:  03/30/2011  T:  03/30/2011  Job:  [id number]  Electronically Signed by Lonia Blood M.D. on 04/11/2011 09:51:59 AM Electronically Signed by Toya Smothers  on 04/13/2011 10:26:51 AM

## 2011-04-21 ENCOUNTER — Encounter: Payer: MEDICARE | Admitting: Emergency Medicine

## 2011-05-11 NOTE — Discharge Summary (Signed)
NAMERIGGIN, CUTTINO NO.:  [id number]   MEDICAL RECORD NO.:  [id number]          PATIENT TYPE:  INP   LOCATION:  2029                         FACILITY:  [facility]   PHYSICIAN:  Bruce Rexene Edison. Swords, MD    DATE OF BIRTH:  Oct 06, 1927   DATE OF ADMISSION:  03/31/2009  DATE OF DISCHARGE:  04/04/2009                               DISCHARGE SUMMARY   DISCHARGE DIAGNOSES:  1. Right lower lobe pneumonia (positive strep urinary antigen),      improving.  2. Atrial fibrillation with rapid ventricular rate (increased      diltiazem).  3. History of transient ischemic attack.  4. History of idiopathic thrombocytopenic purpura.  5. History of bladder cancer followed by Dr. Earlene Plater.  6. Dyslipidemia with intolerance to STATINS.  7. History of anemia.   DISCHARGE MEDICATIONS:  See medication reconciliation sheet.   HOSPITAL PROCEDURES:  Chest x-ray on March 31, 2009, demonstrated COPD  with suspicion of right lower lobe pneumonia.   HOSPITAL LABORATORIES:  Protime on February 04, 2009, demonstrated INR  2.5.  Respiratory culture unremarkable.  Blood cultures negative to  date.   CBC on April 02, 2009, with hemoglobin of 10.9, white count 7.7, and  platelet count 227,000.  Cardiac panels unremarkable.  Legionella  antigen negative.  TSH 1.317.  Lipid panel with cholesterol 94 and HDL  of 35.  Strep pneumo urinary antigen was positive.   CONDITION ON DISCHARGE:  Improved, ambulating in the halls without  difficulty, tolerating a diet.   FOLLOWUP PLANS:  Dr. Shelva Majestic next week.  He is to have his protime  checked on Monday, April 07, 2009, and tell Dr. Shelva Majestic that he has been  taking Avelox.   Follow up with Dr. [name] 1 week.   HOSPITAL COURSE:  Pneumonia.  The patient presented with heart disease,  generalized weakness, shortness of breath, and fever.  He was treated as  an outpatient by his cardiologist with a Z-Pak.  The patient had really  no significant improvement.  The  patient came to the hospital.   Right lower lobe pneumonia.  Positive urinary strep antigen.  The  patient treated aggressively with antibiotics and is tolerating Avelox.  He will complete another 3 days.   Atrial fibrillation with rapid ventricular response.  Likely exacerbated  by current symptoms.  Cardizem will be increased.  The patient will  follow up with Dr. Shelva Majestic.   Other medical problems include history of ITP.  Platelets are stable.   History of bladder cancer followed by Dr. Earlene Plater.   Anemia has been mild.   Leukocytosis, chronic likely exacerbated by recent prednisone use.   All the patient's questions were answered at the time of discharge.  Greater than 30 minutes spent in discharge planning.      Bruce Rexene Edison Swords, MD  Electronically Signed     BHS/MEDQ  D:  04/04/2009  T:  04/04/2009  Job:  [id number]

## 2011-05-11 NOTE — Discharge Summary (Signed)
NAMEJAMERE, [name] NO.:  [id number]   MEDICAL RECORD NO.:  [id number]          PATIENT TYPE:  INP   LOCATION:  3703                         FACILITY:  [facility]   PHYSICIAN:  Duke Salvia, MD, FACCDATE OF BIRTH:  1927/05/08   DATE OF ADMISSION:  06/23/2008  DATE OF DISCHARGE:  06/24/2008                               DISCHARGE SUMMARY   PRIMARY FINAL DISCHARGE DIAGNOSIS:  Atrial fibrillation with rapid  ventricular response.   SECONDARY DIAGNOSES:  1. History of transient ischemic attack in 2007.  2. Chronic anticoagulation with Coumadin.  3. History of atrial flutter.  4. History of pleural effusions, treated with thoracentesis, resulted      in pneumothorax and chest tube.  5. Hypertension.  6. History of idiopathic thrombocytopenic purpura, status post      omentectomy.  7. Bladder cancer with multiple procedures.   ALLERGY OR INTOLERANCE:  VYTORIN, SULFA, PREDNISONE, VALIUM, and  MORPHINE.   TIME AT DISCHARGE:  41 minutes.   HOSPITAL COURSE:  [name] is a 77 year old male with a history of  atrial fibrillation.  He had some dizziness and came to the hospital  where his blood pressure was 140/90 and his heart rate was greater than  110.  He was admitted for further evaluation.   [name] had a chest x-ray that showed new air space disease on the  right lower lobe suggestive of early pneumonia.  However, he was  afebrile and his white count was within normal limit.  He was not  coughing.  He did not feel that he needed antibiotics.   Cardiac enzymes were negative for MI.  His INR was slightly  subtherapeutic at 1.8 and he is encouraged to increase his Coumadin  slightly, but he doubts he will do so.  Dr. Graciela Husbands discussed the  situation with him at length on June, 29, 2009.  He has a reported  history of mild cardiomyopathy, as his EF was reported to be about 40%.  [name] prefers to have further evaluation done as an outpatient.  Dr.  Graciela Husbands  therefore considered him stable for discharge, as his  symptoms had resolved and he agrees to follow up with Dr. Shelva Majestic with  an echocardiogram.  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 says  he would like to make no med changes at this time.  He feels he is  getting significant side effects with both the Cardizem and the Coumadin  and is therefore reluctant to change the doses.   DISCHARGE INSTRUCTIONS:  1. His activity level is to be as tolerated.  2. He is encouraged to stick to a low fat and salt diet.  3. He is to follow up with Dr. Shelva Majestic and get an echocardiogram on      July 23, 2008, at 10:00.  4. He is to get a Coumadin checked [facility] on July 02, 2008, at 09:15.  5. He is to follow up with Dr. [name], Dr. Graciela Husbands,  Dr. Pearlean Brownie, and Dr.      Earlene Plater as needed.   DISCHARGE MEDICATIONS:  1. Cardizem CD 120 mg daily.  2. Coumadin 2.5 mg daily, except for 5 mg is recommended on 2 days a      week instead of the 1 day a week, he was taking prior to admission.   LABORATORY DATA:  Hemoglobin 13.6, hematocrit 40.1, and WBCs 3.9.      Theodore Demark, PA-C      Duke Salvia, MD, [facility]  Electronically Signed    RB/MEDQ  D:  06/24/2008  T:  06/24/2008  Job:  [id number]

## 2011-05-11 NOTE — Assessment & Plan Note (Signed)
[facility]                            CARDIOLOGY OFFICE NOTE   NAME:[name], [name]                         MRN:          [id number]  DATE:03/06/2008                            DOB:          [birth year]    PRIMARY CARDIOLOGIST:  Duke Salvia, MD, F.A.C.C.   This is a 77 year old white male patient of Dr. Graciela Husbands, who has a history  of chronic atrial fibrillation with rapid ventricular rate.  He has been  reluctant to increase his medications in the past and accepts that his  heart rate runs between 80 and 150.  He has not been seen here since  2006, although he was seen in the hospital in 2007 with a questionable  TIA and had his Coumadin adjusted.   He is here today only because he was told he needed to come in for  followup.  He says he feels better than he has in a long time.  He  denies any chest pain, palpitations, dyspnea, dyspnea on exertion,  dizziness or presyncope.  His last 2D echo he had here in 2006 showed an  ejection fraction of 40%, but he says he had a friend perform one last  year in [facility] and his ejection fraction was between 45 and 50%.  He  also sought someone in [place] who does alternative medicine.  He  talks about taking CoQ10 to stimulate his heart and prevent any  cardiomyopathy, as well as wanting to take creatine to increase his  muscle mass.  He is reluctant to adjust his Diltiazem or take a beta  blocker for rate control.  He says, when he is at complete rest, his  heart rate goes down to 80, but generally it stays in the 110-120 range  and gets up as high as 150.   CURRENT MEDICATIONS:  1. Diltiazem 120 mg daily.  2. Coumadin as directed.  3. D3 1000 units a half a day.   PHYSICAL EXAM:  This is a thin, 77 year old white male, in no acute  distress.  Blood pressure 138/74, pulse 128, weight 136.  NECK:  Without JVD, HJR, bruit or thyroid enlargement.  LUNGS:  Clear, anterior, posterior and lateral.  HEART:   Irregularly irregular at 130 beats per minute.  Normal S1 and S2  with a 1/6 systolic ejection murmur at the left sternal border.  ABDOMEN:  Soft without organomegaly, masses, lesions or abnormal  tenderness.  EXTREMITIES:  Without cyanosis, clubbing or edema.  He has good distal  pulses.   EKG:  Atrial fibrillation with rapid ventricular rate at 128 beats per  minute.  Nonspecific STT-wave changes.   IMPRESSION:  1. Chronic atrial fibrillation with rapid ventricular rate.  2. Left ventricular dysfunction.  Ejection fraction 40% on echo in      2006.  He claims an echo by a friend was done last year, EF 45-50%.  3. Hypertension.  4. History of idiopathic thrombocytopenia, status post splenectomy.  5. Questionable TIA in 2007.  6. History of hydrothorax, status post pleurodesis.  7. History of bladder tumors.  8. Coumadin therapy.   PLAN AT THIS TIME:  I have asked the patient to double up on his  Diltiazem for better rate control.  I do not believe he will do this.  I  explained the risk of cardiomyopathy with permanent tachycardia, but he  seems willing to take this risk and thinks he can prevent it with  alternative medicine.  He has an appointment with the Coumadin clinic  today and will see Dr. Graciela Husbands back in six months.      Jacolyn Reedy, PA-C  Electronically Signed      Arturo Morton. Riley Kill, MD, [facility]  Electronically Signed   ML/MedQ  DD: 03/06/2008  DT: 03/06/2008  Job #: [id number]

## 2011-05-11 NOTE — H&P (Signed)
NAMENORMA, IGNASIAK NO.:  [id number]   MEDICAL RECORD NO.:  [id number]          PATIENT TYPE:  INP   LOCATION:  3703                         FACILITY:  [facility]   PHYSICIAN:  Luis Abed, MD, FACCDATE OF BIRTH:  08-Apr-1927   DATE OF ADMISSION:  06/23/2008  DATE OF DISCHARGE:                              HISTORY & PHYSICAL   PRIMARY CARDIOLOGIST:  Duke Salvia, MD, [facility]   PRIMARY CARE PHYSICIAN:  Corwin Levins, MD   NEUROLOGIST:  Pramod P. Pearlean Brownie, MD   UROLOGIST:  Lucrezia Starch. Earlene Plater, MD   The patient is a friend of Dr. Tia Masker.   HISTORY OF PRESENT ILLNESS:  Mr. Edmondson is a 77 year old Caucasian  gentleman who presented to [facility] today at the  assistance of his family after he experienced a dizzy episode.  Mr.  Chilton Si has had atrial fib for many years, status post multiple attempted  cardioversions with temporary return to normal sinus rhythm.  Also, he  has a history of reluctance to adjust his diltiazem or to take a beta-  blocker for rate control.  This apparently has been a big issue.  Please  refer to previous office notes for further information on this.  Mr.  Chilton Si was in his usual state of health this morning, in the car with his  wife on the way to visit family when he became suddenly dizzy.  He  denied any associated symptoms.  His wife was driving, she pulled off  the road for about 5 minutes, he said there the dizziness went away, but  he did not feel like traveling at that point, so they returned home.  At  home, he checked his blood pressure, 140/90.  His heart rate anywhere  from 114-140.  Note, this patient states his heart rate usually runs  above 100 and that he is comfortable with that.  Today, however, because  of the dizziness, his wife prompted him to go to urgent care and get  evaluated.  There, he had a 12-lead EKG done that showed atrial fib in  the 140s.  The patient was sent to [facility] for further evaluation.  Here in the ER, he was given 20 mg IV Cardizem bolus and a drip was  started at 5.  Mr. Thone is still in atrial fib, rate around 100-110.  He states he feels fine.  He is not sure why his blood pressure is  elevated.  He states his blood pressure at home runs 130/80s.  His heart  rate, he states, is generally less than 115.  He states he very actively  plays golf 3 times a week.  He works in his garden.  He states he has  never had a cardiac catheterization.  He states he will never have one.  He states that his friend, Dr. Tia Masker, did an echo on him in 2006 and  his EF was 40% and that he had a stress test about 2 years ago per  patient's report and he was told it was okay.  Note, this was not  done  through our office either.   PAST MEDICAL HISTORY:  1. Chronic atrial fib, status post TEE with cardioversions at least 4      times with temporary return to normal sinus rhythm with the patient      reluctant to adjust medications for improved heart rate control.  2. Pleural effusions, status post thoracentesis in 2002, complicated      by a pneumothorax resolved with resultant chest tube.  3. History of TIA in 2007.  4. Coumadin therapy.  5. History of atrial flutter.  6. Hypertension.  7. History of idiopathic thrombocytopenic purpura, status post      splenectomy.  8. History of bladder cancer with multiple procedures.  The patient      states he has had interferon treatments.   SOCIAL HISTORY:  [place]  wife.  He is retired from [employer].  He has 1 adult son.  He  quit using tobacco years ago.  For exercise, he does golf and gardening.  He denies any EtOH use, drugs, or herbal medication.   FAMILY HISTORY:  Noncontributory for CAD.   REVIEW OF SYSTEMS:  Positive for shortness of breath, dizziness, and  anxiety associated with today's episode.   ALLERGIES:  VYTORIN, SULFA, and PREDNISONE.   MEDICATIONS:  1. Cardizem  120 mg.  2. Coumadin.  The patient states he takes 5 mg on Monday and 2.5 on      Tuesday through Sunday.   PHYSICAL EXAMINATIONS:  VITAL SIGNS:  Temp 97.9, heart rate 132,  respirations 18, blood pressure 136/104 initially, and sat 98% on 2  liters.  GENERAL:  In no acute distress.  Thin elderly male.  HEENT:  Unremarkable.  NECK:  Supple without lymphadenopathy, bruits, or JVD.  CARDIOVASCULAR:  S1 and S2.  Irregular, tachycardic.  LUNGS:  Clear to auscultation bilaterally.  SKIN: Warm and dry.  ABDOMEN:  Soft, nontender, and positive bowel sounds.  LOWER EXTREMITIES:  Without clubbing, cyanosis, or edema.  NEUROLOGIC:  Alert and oriented x3.   Chest x-ray showed new air space, right lower lobe questionable early  pneumonia, and COPD.  EKG, atrial fib flutter, rate of 144.   Lab work, H and H 15 and 44, WBCs 3.9, and platelets 124,000.  Sodium  138, potassium 4.3, BUN 30, creatinine 1.1, and glucose 93.  Point-of-  cares, first set troponin 0.11.  Second and third sets of point-of-cares  were negative with a first set of cardiac enzymes negative.  PT 21.8 and  INR subtherapeutic at 1.8.   IMPRESSION:  1. Atrial fibrillation with rapid ventricular response.  2. Questionable medical compliance with the patient reluctant to      adjust or change medicines.  3. Subtherapeutic INR.  4. Hypertension.  5. Mild bump in initial troponin with three followup enzymes negative.   PLAN:  To admit the patient for observation.  He initially was reluctant  to do this, but since that time he has agreed to be admitted and is  willing to try Cardizem 60 mg p.o. q.6 h.  Coumadin will have pharmacy  adjust dose and check a TSH on the patient.  Hopefully, he could be  discharged tomorrow morning.  Dr. Willa Rough has been into examine and  assess.  The patient agrees with plan of care.      Dorian Pod, ACNP      Luis Abed, MD, [facility]  Electronically Signed  MB/MEDQ  D:   06/23/2008  T:  06/24/2008  Job:  [id number]

## 2011-05-11 NOTE — Op Note (Signed)
[name], BIFULCO NO.:  [id number]   MEDICAL RECORD NO.:  [id number]          PATIENT TYPE:  AMB   LOCATION:  NESC                         [facility]   PHYSICIAN:  Ronald L. Earlene Plater, M.D.  DATE OF BIRTH:  [birth year]   DATE OF PROCEDURE:  02/23/2008  DATE OF DISCHARGE:                               OPERATIVE REPORT   PREOPERATIVE DIAGNOSES:  1. 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  2. Bladder lesion.   POSTOPERATIVE DIAGNOSES:  1. 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  2. Bladder lesion.   OPERATIVE PROCEDURE:  Cystourethroscopy, bladder biopsy.   SURGEON:  Gaynelle Arabian, MD.   ANESTHESIA:  LMA.   ESTIMATED BLOOD LOSS:  Negligible.   TUBES:  None.   COMPLICATIONS:  None.   SPECIMEN:  To pathology.   INDICATIONS FOR PROCEDURE:  [name] is a very nice 77 year old white  male whose had chronic atrial fibrillation and had some problems with  that. 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 On surveillance cystourethroscopy, he was found to have a  raised, reddish lesion in the right lateral bladder wall towards the  bladder neck and after understanding the risks, benefits and  alternatives, being cleared by Dr. Shelva Majestic and stopping his Coumadin, he  as elected to proceed with cysto bladder biopsy.   DESCRIPTION OF PROCEDURE:  The patient was placed in the supine position  after proper LMA anesthesia, was placed in the dorsal lithotomy position  and prepped and draped with Betadine in a sterile fashion.  Cystourethroscopy was performed with a 22.5 [place] Olympus panendoscope.  Utilizing the 12 and 70 degrees, the bladder was carefully inspected and  efflux of clear urine was noted from the normally placed ureteral  orifices bilaterally.  There was mild trilobar hypertrophy and grade  1  trabeculation of the bladder. Approximately a centimeter lateral and  just distal to the right ureteral orifice was a raised, reddish lesion  approximately 4 mm in diameter.  It was removed with a cold cup biopsy  forceps and the area was cauterized with Bugbee coagulation  cautery.  The specimen was submitted to pathology.  No other lesions  were noted be present.  Good hemostasis was noted to be present.  The  bladder was drained.  The panendoscope was removed.  The patient was  taken to the recovery room stable.      Ronald L. Earlene Plater, M.D.  Electronically Signed     RLD/MEDQ  D:  02/23/2008  T:  02/24/2008  Job:  [id number]

## 2011-05-11 NOTE — H&P (Signed)
[name], [name] NO.:  [id number]   MEDICAL RECORD NO.:  [id number]          PATIENT TYPE:  EMS   LOCATION:  MAJO                         FACILITY:  [facility]   PHYSICIAN:  Ramiro Harvest, MD    DATE OF BIRTH:  Oct 11, 1927   DATE OF ADMISSION:  03/31/2009  DATE OF DISCHARGE:                              HISTORY & PHYSICAL   PRIMARY CARE PHYSICIAN:  Dr. Oliver Barre of [facility].   CARDIOLOGIST:  Dr. Shelva Majestic of [facility] who is no longer in practice.  The patient has also been seen by cardiologist, Dr. Graciela Husbands of [facility]  cardiology.   NEUROLOGIST:  Dr. Pearlean Brownie.   UROLOGIST:  Dr. Earlene Plater.   HISTORY OF PRESENT ILLNESS:  [name] is a 77 year old white  gentleman with history of atrial fibrillation status post multiple  cardioversions on chronic Coumadin therapy, history of LV dysfunction,  EF of 40% per echo of 2006.  However, the patient states that had a  recent echo done a year ago with an EF in the 2s.  Also, a history of  ITP status post splenectomy, history of hypertension, history of bladder  cancer status post multiple procedures, presented to the ED with 11-day  history of worsening productive cough, pleuritic chest pain, shortness  of breath, fever, chills, headache and generalized weakness.  The  patient presented to his cardiologist's office one week prior to  admission, was placed on a Z-Pak and prednisone.  Five days prior to  admission secondary to no improvement in symptoms, the patient presented  back to the cardiologist's office.  A chest x-ray was done at that time,  and blood work was also done.  The patient and his wife called the  cardiologist's office on the day of admission after the patient had  finished his course of antibiotics with no improvement in his symptoms.  He was told he had a pneumonia and a white count of 58,000 and sent to  the ED.  The patient denies any chest pain.  No nausea or vomiting.  No  abdominal pain.   No melena or hematemesis.  No hematochezia.  No  palpitations.  No focal neurological symptoms.  No change in chronic  lower extremity edema.  No other associated symptoms.  The patient was  seen in the ED.  Chest x-ray done was consistent with a right lower lobe  pneumonia.  The patient was also found in atrial fibrillation with RVR  and a heart rate up to 160s.  Was given a total of 35 mg of Cardizem IV  push and placed on a Cardizem drip.  CBC I-stat-8 done in the ED at a  sodium of 130, a glucose of 103, BUN of 29, otherwise was within normal  limits.  Point of care cardiac markers were negative.  CBC had a white  count of 13.8 and an ANC of 12.5.  Otherwise, was within normal limits.  A urinalysis done was bland.  PT with a 35.5 and INR of 3.2.  We are  called to admit the patient for further evaluation and management.  ALLERGIES:  SULFA CAUSES HIVES, VALIUM CAUSES THE PATIENT TO CLIMB THE  WALLS, MORPHINE CAUSES HALLUCINATIONS.  VYTORIN:  THE PATIENT IS NOT  SURE WHETHER HE IS ALLERGIC TO IT, BUT HAS BEEN ON PREVIOUS H&P.   PAST MEDICAL HISTORY:  1. History of chronic atrial fibrillation/atrial flutter status post      TEE with cardioversion at least four times, will attempt to return      to normal sinus rhythm.  2. History of pleural effusion status post thoracentesis in 2002      complicated by pneumothorax, resolved with resultant chest tube.  3. 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 Edwyna Shell.  4. History of TIA in 2007.  5. Hypertension.  6. History of idiopathic thrombocytopenic purpura status post      splenectomy.  7. History of bladder cancer with multiple procedures.  The patient      also states has been treated with interferon treatments, was      supposed to see Dr. Earlene Plater 5 days prior to admission.   HOME MEDICATIONS:  1. Diltiazem 120 mg p.o. daily.  2. Coumadin 5 mg on Mondays, 2.5 mg Tuesday's through Sunday's.  3. Vitamin D three 2000 I units  daily.   SOCIAL HISTORY:  The patient is married, retired from [employer].  Has one adult son.  No tobacco use.  No alcohol use.  No IV  drug use.   FAMILY HISTORY:  Noncontributory.   REVIEW OF SYSTEMS:  As per HPI, otherwise negative.   PHYSICAL EXAMINATION:  VITAL SIGNS:  Temperature 99.3, blood pressure  117/76, heart rate 160s-120s, respiratory rate 16, satting 94% on room  air.  GENERAL:  Patient lying on bed in no apparent distress.  HEENT:  Normocephalic, atraumatic.  Pupils equal, round and reactive to  light and accommodation.  Extraocular movements intact.  Oropharynx is  clear.  No lesions, no exudates.  NECK:  Supple.  No lymphadenopathy.  RESPIRATORY:  Coarse breath sounds in the bases, left greater than  right.  No wheezes.  CARDIOVASCULAR:  Irregularly irregular.  ABDOMEN:  Soft, nontender, nondistended.  Positive bowel sounds.  EXTREMITIES:  No clubbing, cyanosis or edema.  NEUROLOGICAL:  The patient is alert and oriented x3.  Cranial nerves II-  XII are grossly intact.  No focal deficits.   ADMISSION LABORATORY DATA:  CBC white count 13.8, hemoglobin 14.3,  hematocrit 42.4, platelet count 206, ANC of 12.5.  I-state-8:  Sodium of  130, potassium 4.9, chloride 100, glucose 103, BUN 29, creatinine 1.4.  Point of care cardiac markers:  CK-MB 1.3, troponin I less than 0.05,  myoglobin of 139.  UA was yellow, clear, specific gravity 1.020, pH of  6.5, glucose negative, bilirubin negative, ketones 15, blood negative,  protein 30, urobilinogen 1.0, nitrite negative, leukocytes negative.  Urine microscopy:  WBC 0-2, bacteria rare and amorphous urates.  PT of  35.5, INR of 3.2.  Chest x-ray shows COPD with suspicion of right lower  lobe pneumonia.  EKG is atrial fibrillation with RVR and the right axis  deviation.   ASSESSMENT AND PLAN:  [name] is a 77 year old gentleman history of  chronic atrial fibrillation/atrial flutter on chronic Coumadin  therapy,  history of TIAs, hypertension, ITP presenting to the ED with worsening  symptoms of cough, fever, chills, headache and generalized weakness  found to have a pneumonia and atrial fibrillation with RVR.   PROBLEMS:  1. Right lower lobe pneumonia.  Likely a  community-acquired pneumonia.      Will check a sputum Gram stain and culture.  Blood cultures are      pending.  Check a urine Legionella and pneumococcus antigen.  Check      a comprehensive metabolic profile.  Will place on empiric IV      antibiotics of Avelox and monitor.  2. Atrial fibrillation with rapid ventricular response, likely      exacerbated by problem #1 versus secondary to acute coronary      syndrome versus secondary to endocrine as well.  Check cardiac      enzymes q.8 h x3.  Check a TSH.  Check a comprehensive metabolic      profile.  Check a 2-D echo to rule out LV dysfunction.  The patient      status post 35 mg of IV Cardizem pushes, will continue the Cardizem      drip for now for rate control.  The patient on Coumadin for      anticoagulation and heart rate is still uncontrolled, may consider      the addition of a beta blocker versus starting amiodarone.      Cardiology has been called by the ED and will consult on the      patient for further evaluation and management.  3. Hyponatremia, likely secondary to hypovolemic hyponatremia versus      euvolemic hyponatremia versus hypervolemic hyponatremia.  Will      check orthostatics.  Check a fractional excretion of sodium.  Check      a TSH.  Gentle IV fluid hydration secondary to the patient's      history of LV dysfunction.  Check a comprehensive metabolic      profile.  Will monitor and follow.  History of TIA on Coumadin      therapy.  Supratherapeutic INR, will hold Coumadin for now and      monitor closely as the patient will be placed on IV Avelox.  4. History of ITP, stable.  5. History of bladder cancer status post multiple procedures.  6.  Prophylaxis.  Protonix for GI prophylaxis.  Coumadin for DVT      prophylaxis.   It has been a pleasure taking care of [name].      Ramiro Harvest, MD  Electronically Signed     DT/MEDQ  D:  03/31/2009  T:  04/01/2009  Job:  [id number]   cc:   Corwin Levins, MD  Duke Salvia, MD, [facility]  Pramod P. Pearlean Brownie, MD  Lucrezia Starch Earlene Plater, M.D.

## 2011-05-14 NOTE — H&P (Signed)
[place]. [facility]  Patient:    [name], [name]                         MRN: [id number] Adm. Date:  [id number] Attending:  Caleb Popp CC:         Charlcie Cradle. Delford Field, M.D. [facility]  Nathen May, M.D., [facility]   History and Physical  DATE OF BIRTH: 1927-04-06  ADMISSION DIAGNOSIS: Left pneumothorax, status post thoracentesis.  HISTORY OF PRESENT ILLNESS: [name] is a 77 year old gentleman recently admitted to the hospital for recurrent atrial fibrillation.  He underwent TEE guided cardioversion.  He was also found to have a left pleural effusion.  He was anticoagulated.  He was seen by Dr. Delford Field in consultation, who recommended holding Coumadin and outpatient follow-up for thoracentesis. Thoracentesis was performed in the office today and this yielded approximately one liter of serosanguineous fluid.  Post procedure x-ray demonstrated pneumothorax.  The patient was kept in the office for a follow-up film two hours later and this demonstrated increased size in pneumothorax, and consequently he required admission for chest tube placement.  At the present time he denies chest pain or shortness of breath.  He does report a sense of palpitations.  PAST MEDICAL/SURGICAL HISTORY:  1. 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  2. Previous History and Physicals documented that he has had bladder cancer     with removal.  3. Cystoscopy procedure performed in August 2001 where a bladder lesion was     removed but proved to be nonmalignant.  FAMILY HISTORY: As previously documented.  REVIEW OF SYSTEMS: Otherwise noncontributory.  CURRENT MEDICATIONS:  1. Cardizem CD 240 mg p.o. q.d.  2. Lasix 40 mg b.i.d.  3. K-Dur 20 mEq p.o. q.d.  4. Magnesium oxide q.d.  PHYSICAL EXAMINATION:  GENERAL: No acute cardiac or cardiorespiratory distress.  HEENT: Without acute abnormalities.  NECK: No jugular venous distention noted.  CHEST:  Hyperresonance noted on the left, with diminished breath sounds on the left compared to the right.  No other adventitious sounds noted.  CARDIAC: Tachycardia with irregular rhythm.  No murmurs.  ABDOMEN: Soft, with normal bowel sounds.  EXTREMITIES: Without clubbing, cyanosis, or edema.  NEUROLOGIC: Grossly intact.  LABORATORY DATA: Chest x-rays have been reviewed and demonstrate a 40-50% pneumothorax on the left.  IMPRESSION/PLAN:  1. Left pneumothorax, status post thoracentesis.  He will require chest tube     placement.  This will be done at the bedside.  The chest tube will remain     as long as there is an air leak.  If substantial fluid production is noted     he may warrant talc pleurodesis to prevent reaccumulation.  We need to     follow up on the pleural fluid studies sent from the office by Dr. Delford Field.  2. Rapid atrial fibrillation.  He will be continued on Cardizem and monitored     on telemetry.  Coumadin will be held for now until we know what the     management of the pleural effusion will entail.  Dr. Graciela Husbands will be     notified of the patients hospitalization the morning following this     admission. DD:  03/06/01 TD:  03/07/01 Job: [id number]

## 2011-05-14 NOTE — Consult Note (Signed)
[place]. [facility]  Patient:    [name], [name]                         MRN: [id number] Proc. Date: 03/15/01 Adm. Date:  [id number] Attending:  Caleb Popp CC:         Charlcie Cradle. Delford Field, M.D. [facility]  D. Karle Plumber, M.D.   Consultation Report  HISTORY:  I was asked to see this 77 year old gentleman who noted passing some air at the end of voiding on a couple of occasions.  This happened a couple days ago after his Foley catheter was removed.  He is having no particular dysuria or straining and no fecaluria.  He is having no signs or symptoms of diverticulitis or GI problems.  He has had normal cystoscopy in January of this year by Dr. Earlene Plater in the office.  Dr. Darvin Neighbours also has removed bladder carcinoma in the past and did a biopsy in August 2001 and showed a benign bladder mucosa.  He did have inflammatory lesions back then.  He is hospitalized now for therapy of a left pneumothorax and his other problems include rapid atrial fibrillation.  MEDICATIONS: Cardizem CD, Lasix, K-Dur, and magnesium oxide.  PHYSICAL EXAMINATION:  GENERAL:  He is alert and oriented.  He is in no acute distress.  SKIN:  Warm and dry.  ABDOMEN:  Soft and nontender.  No CVA or bladder tenderness.  GENITALIA:  Penis, urethra, meatus, scrotum, testicles, and epididymides without lesions.  IMPRESSION:  My suspicion is that the air that he passed per urethra was just air that had been introduced at the time of his Foley catheter placement.  The other obvious concerns with pneumaturia would be an infection or an intestinal fistula, but my suspicion for those is low.  I will obtain a urinalysis and make sure that is normal.  Also, he has not passed any of this air in the last day or two, so hopefully the problem has resolved but we will follow up on him after urinalysis is complete. DD:  03/15/01 TD:  03/15/01 Job: [id number]

## 2011-05-14 NOTE — Op Note (Signed)
[place]. [facility]  Patient:    [name], [name]                         MRN: [id number] Proc. Date: 08/05/00 Attending:  Lucrezia Starch. Ovidio Hanger, M.D.                           Operative Report  OPERATIVE PROCEDURE:  Cystourethroscopy, cold-cup removal of bladder lesion.  SURGEON:  Lucrezia Starch. Ovidio Hanger, M.D.  ANESTHESIA:  MAC.  ESTIMATED BLOOD LOSS:  Negligible.  TUBES:  None.  COMPLICATIONS:   None.  INDICATIONS:  Mr. Chilton Si is a very nice 77 year old white male, who has a history of transitional cell carcinoma of the bladder.  In addition, has ITT and chronic atrial fibrillation and has been maintained on Coumadin.  The Coumadin was stopped.  He has been cleared for surgery by Dr. Jens Som, cardiologist.  On office cystoscopy, he was found to have a raised lesion just lateral and posterior to the right ureteral orifice.  After understanding risks, benefits and alternatives he elected to proceed with cystoscopy and cold-cup removal of the bladder lesion.  PROCEDURE IN DETAIL:  Patient was placed in supine position.  Proper MAC anesthesia, was placed in the dorsal lithotomy position.  Prepped and draped with Betadine in a sterile fashion.  A cystourethroscopy was performed with a 22.5-French Olympus panendoscope utilizing the 12 and 70 degree lens.  The bladder was carefully inspected.  Efflux of clear urine was noted from the normally placed ureteral orifices bilaterally.  There was mild trilobar hypertrophy of the prostate and grade 1 trabeculation was noted.  Just proximal and lateral to the right ureteral orifice, there was a raised, patchy lesion, which was removed in its entirety with cold-cup biopsy forceps and the base was cauterized with Bugbee coagulation cautery.  No other lesions were noted.  The specimen was submitted to pathology.  Good hemostasis was noted to be present.  The bladder was drained, the panendoscope was visually removed and  the patient was taken to recovery room stable. DD:  08/05/00 TD:  08/05/00 Job: [id number]

## 2011-05-14 NOTE — Op Note (Signed)
[place]. [facility]  Patient:    [name], [name]                         MRN: [id number] Proc. Date: 03/10/01 Adm. Date:  [id number] Disc. Date: [id number] Attending:  Caleb Popp CC:         Charlcie Cradle Delford Field, M.D. [facility]   Operative Report  PREOPERATIVE DIAGNOSIS:  Possible left chest empyema and loculated left pleural effusion.  POSTOPERATIVE DIAGNOSIS:  Left fibrothorax with empyema.  OPERATION PERFORMED:  Left VATS, left thoracotomy with decortication, left upper lobe and left lower lobe pleurectomy.  INDICATIONS:  This patient had a loculated recurrent left effusion with no fever but it was thought that he may have an early empyema.  PROCEDURE:  He underwent general anesthesia, turned to the left lateral thoracotomy position, was prepped and draped in the usual sterile manner.  Two trocar sites were made in the anterior and posterior axillary line at the seventh intercostal space.  Two trocars were inserted.  There was evidence of loculations in the pleura and these lobes had to be taken down between the anterior and posterior trocar sites in order to get the scope in.  Multiple cultures were taken of the fluid but it primarily was fibrous tissue and scarring and no evidence of a lot of exudate - it was many more of a fibrothorax than an empyema.  A posterolateral thoracotomy was made over the fifth intercostal space and the latissimus dorsi was divided and the fifth intercostal space was entered.  The serratus anterior was reflected anteriorly.  The parietal pleura was taken down and there was a thick parietal pleura of 2-3 mm in size, then an excellent pleural resection of this was done of the parietal pleura reflecting it laterally up to the apex and then medially, and then laterally down to the diaphragm, then medially inferiorly to the aortic arch, and medially superiorly to the mediastinum.  This was removed.  The lung - which was stuck  to the parietal pleura - was taken off by sharp dissection until the parietal pleura was removed superiorly and then it was removed inferiorly off the diaphragm and then medially off the heart.  The lung was then taken off all the parietal pleura but there was still a thick visceral pleura; left lower lobe was almost completely trapped and there was trapping of the lingula and some of the anterior segment of the left upper lobe.  This thick pleura was then taken off by sharp and blunt dissection using Kitners until the left lingula and left upper lobe were freed up.  The fissure was then freed also.  Then attention was turned to the left lower lobe and this was also freed up by sharp and blunt dissection, freeing off this thick fibrous field off the left lower lobe.  This was very tedious and required a long time to do it.  A wedge biopsy of the left lower lobe was done as well as a wedge biopsy of the left upper lobe was done where it was very thickened.  After this had been removed, all had been removed, there were several air leaks in the left lower lobe and this was sutured with 3-0 Vicryl in interrupted fashion and one or two in the upper lobe were also sutured with 3-0 Vicryl.  Two chest tubes were placed through the trocar sites and a third chest tube was inserted.  It was  a right angle that went to the base when the two anterior and posterior trocar sites had straight 36 chest tubes.  Areas were irrigated copiously.  The chest was closed with three pericostals of 2 chromic, #1 Vicryl in the muscle layer, 2-0 Vicryl in the subcutaneous tissue, and Ethicon skin clips.  Patient returned to the recovery room in stable condition. DD:  04/17/01 TD:  04/18/01 Job: [id number]

## 2011-05-14 NOTE — Discharge Summary (Signed)
[place]. [facility]  Patient:    [name], [name]                         MRN: [id number] Adm. Date:  [id number] Disc. Date: 03/02/01 Attending:  Nathen May Dictator:   Chinita Pester, CRNP CC:         Charlcie Cradle. Delford Field, M.D. [facility]  Maricela Bo, M.D.   Discharge Summary  DISCHARGE DIAGNOSES: 1. Atrial flutter/atrial fibrillation. 2. Pleural effusion.  HISTORY OF PRESENT ILLNESS:  This is a 77 year old gentleman with recurrent atrial fibrillation who had a TEE cardioversion several weeks ago to sinus rhythm who presents back to follow up.  He had been in atrial fibrillation at a rate of 200 per minute.  He tends to deny symptoms.  He is not sure why he developed a recurrent atrial fibrillation, but last evening he does remember that he had more trouble with shortness of breath and weakness.  He has has no chest pain.  He was admitted for cardioversion and addition of antiarrhythmic agent.  MEDICATION:  Coumadin.  HOSPITAL COURSE:  The patient was admitted and placed on a Cardizem drip as well as continuing on his Coumadin therapy.  The patient had a consult with Dr. Delford Field for his effusions.  The patient underwent TEE cardioversion.  The patient was cardioverted with 200 joules.  He remained in atrial fibrillation with 300 joules x 2 and remained in atrial fibrillation.  The patient was to be placed on antiarrhythmic therapy, but needed a thoracentesis before hand. The patient was discharged to home on Cardizem CD 240 to keep his rhythm under control.  DISCHARGE MEDICATIONS: 1. Cardizem CD 240 mg a day. 2. Lasix 40 mg twice a day. 3. Potassium 20 mEq twice a day. 4. Maxzide 400 mg twice a day 1/2 tablet. 5. No Coumadin.  ACTIVITY:  No strenuous activity.  DIET:  Low fat, low cholesterol, low salt diet.  FOLLOWUP:  Follow up with Dr. Delford Field at 10 a.m. on Monday for a thoracentesis. He was also to have a 2D echocardiogram at 12:15 p.m.,  on the second floor, on Monday as well. DD:  03/02/01 TD:  03/02/01 Job: [id number]

## 2011-05-14 NOTE — Consult Note (Signed)
NAMETHERON, CUMBIE NO.:  [id number]   MEDICAL RECORD NO.:  [id number]          PATIENT TYPE:  INP   LOCATION:  2009                         FACILITY:  [facility]   PHYSICIAN:  Pramod P. Pearlean Brownie, MD    DATE OF BIRTH:  1927/05/23   DATE OF CONSULTATION:  05/10/2006  DATE OF DISCHARGE:                                   CONSULTATION   REFERRING PHYSICIAN:  Duke Salvia, M.D.   REASON FOR REFERRAL:  TIA.   HISTORY OF PRESENT ILLNESS:  Mr. Eveland is a 77 year old pleasant Caucasian  male who developed a transient episode of left eye visual loss yesterday  evening.  The patient was at home and got up to get a glass of water and he  was in the kitchen and noticed that his vision is cloudy in the left eye  only in the upper portion, he was able to see the lower portion quite well.  This lasted about 10 minutes and cleared completely.  He denies any headache  or any other focal neurological symptom accompanying this.  The patient  previously has had no known TIAs, strokes or focal neurological problems.  He has never seen a neurologist.   PAST MEDICAL HISTORY:  1.  Atrial flutter/atrial fibrillation, is on Coumadin for last 4 years.  2.  He recently underwent a prostate biopsy for which he had stopped the      Coumadin a week ago and had just restarted the Coumadin yesterday two      nights ago.  3.  The patient has been complaining of passing some clots through his      urethra, in fact he passed it twice today.  The client did speak with      his urologist, Dr. Darvin Neighbours, who advised to pass a Foley catheter and      irrigate it and the patient may be anticoagulated as necessary.  The      patient's INR today in Dr. Odessa Fleming office was 1.4.   MEDICATION ALLERGIES:  VYTORIN, SULFA and PREDNISONE.   HOME MEDICATIONS:  Coumadin and Cardizem.   SOCIAL HISTORY:  The patient is retired and married, lives with his wife.   PAST MEDICAL HISTORY:  1.  Bladder tumor.  2.   Idiopathic thrombocytopenia.  3.  Hypertension.  4.  Atrial fibrillation.   PAST SURGICAL HISTORY:  1.  Splenectomy.  2.  Pleurodesis for hydrothorax.   REVIEW OF SYSTEMS:  Not significant for any fever, cough, chest pain,  diarrhea or other illness.   PHYSICAL EXAMINATION:  GENERAL:  A pleasant elderly Caucasian male who is  not in distress.  VITAL SIGNS:  Afebrile, pulse rate is 110-130 per minute, atrial  fibrillation, irregular, respiratory rate is 16 per minute, blood pressure  150/90, respiratory rate 20 per minute.  Distal pulses well felt.  HEENT:  Head is nontraumatic.  ENT exam is unremarkable.  NECK:  Supple without bruit.  CARDIOVASCULAR:  No murmur or gallop.  Irregular heart sounds.  LUNGS:  Clear to auscultation.  ABDOMEN:  Soft, nontender.  NEUROLOGICAL:  The patient was pleasant, awake, alert, cooperative.  There  was no aphasia, apraxia or dysarthria.  Pupils 3 mm, equally, regular,  reactive.  Eye movements are full range without nystagmus.  Face is  symmetric.  Palatal movements are normal.  Tongue is midline.  Motor system  exam reveals symmetric upper and lower extremity strength, tone, reflexes,  coordination, sensation.  He walks with a slow steady gait.   LABORATORY DATA:  Today INR in Dr. Odessa Fleming office is 1.4.   Carotid ultrasound has been done, results are pending.  MRI scan of the  brain done today reveals no acute infarct.  Old small lacunar infarct is  noted in the left corona radiata.  A small incidental venous angioma is  noted in the left cerebellum.   IMPRESSION:  A 77 years gentleman with transient episode of partial left eye  visual loss, likely secondary to embolization to a branch of central retinal  artery from atrial fibrillation.   PLAN:  The patient clearly will benefit with anticoagulation with heparin  and Coumadin for stroke prevention.  However, he is having some urethral  bleeding.  I would recommend passing a Foley catheter and  irrigating, as  suggested by his neurologist and if he is not actively bleeding per urethra,  he may be started on IV heparin.  Resume Coumadin.  Check carotid ultrasound  results.  Check fasting lipid profile, hemoglobin A1c and homocysteine.  Thank you for the referral.  The patient may follow up in the future  electively in the office if necessary.           ______________________________  Sunny Schlein. Pearlean Brownie, MD     PPS/MEDQ  D:  05/10/2006  T:  05/11/2006  Job:  [id number]

## 2011-05-14 NOTE — Op Note (Signed)
   NAMESERGIO, ZAWISLAK NO.:  [id number]   MEDICAL RECORD NO.:  [id number]                   PATIENT TYPE:  AMB   LOCATION:  [facility]                                 FACILITY:  [facility]   PHYSICIAN:  Ronald L. Ovidio Hanger, M.D.           DATE OF BIRTH:  04/22/1927   DATE OF PROCEDURE:  07/16/2003  DATE OF DISCHARGE:                                 OPERATIVE REPORT   PREOPERATIVE DIAGNOSES:  Bladder lesion, history of transitional cell  carcinoma of the bladder.   POSTOPERATIVE DIAGNOSES:  Bladder lesion, history of transitional cell  carcinoma of the bladder.   OPERATION:  Cystourethroscopy, cold cup removal of bladder lesion.   SURGEON:  Lucrezia Starch. Earlene Plater, M.D.   ANESTHESIA:  General laryngeal airways.   ESTIMATED BLOOD LOSS:  Negligible.   TUBES:  None.   COMPLICATIONS:  None.   INDICATIONS FOR PROCEDURE:  Mr. Chilton Si is a very nice 77 year old white male  who has a history of transitional cell carcinoma of the bladder which has  been superficial. On cystourethroscopy in the office, he was found to have a  raised lesion to the right trigonal area and after understanding the risks,  benefits, and alternatives, and being cleared for surgery by Dr. Berton Mount, his cardiologist, it was elected to proceed.   DESCRIPTION OF PROCEDURE:  The patient was placed in supine position and  after proper general laryngeal airway anesthesia was placed in the dorsal  lithotomy position, prepped and draped with Betadine in a sterile fashion.  Cystourethroscopy was performed with a 22.5 French Olympus panendoscope  utilizing the 12 and 70 degree lenses. The bladder was carefully inspected,  efflux of clear urine was noted from the normally placed ureteral orifices  bilaterally. Just lateral and positive to the right ureteral orifice was a  raised lesion which was removed in its entirety with cold cup biopsy  forceps. There was some inflammation at reddened areas  extending to the  bladder neck and these were cauterized with Bugbee coagulation cautery along  with the biopsy site. No other lesions were noted, there was mild trilobar  hypertrophy. Efflux was noted from the normally placed left ureteral orifice  and there were no lesions around it so it was felt that no further biopsies  were needed. Good hemostasis was noted to be present, the bladder was  drained, the panendoscope was removed and the patient was taken to the  recovery room stable.                                               Ronald L. Ovidio Hanger, M.D.    RLD/MEDQ  D:  07/16/2003  T:  07/16/2003  Job:  [id number]

## 2011-05-14 NOTE — Discharge Summary (Signed)
NAMEFARAH, BENISH NO.:  [id number]   MEDICAL RECORD NO.:  [id number]          PATIENT TYPE:  INP   LOCATION:  2009                         FACILITY:  [facility]   PHYSICIAN:  Charlton Haws, M.D.     DATE OF BIRTH:  1927-10-01   DATE OF ADMISSION:  05/10/2006  DATE OF DISCHARGE:  05/14/2006                                 DISCHARGE SUMMARY   PRIMARY CARDIOLOGIST:  Dr. Sherryl Manges   DISCHARGING DIAGNOSIS:  Atrial fibrillation on chronic Coumadin therapy.   SECONDARY DIAGNOSES:  1.  Hypertension.  2.  IPP status post splenectomy.  3.  History of bladder carcinoma.  4.  Questionable transient ischemia attack.   ALLERGIES:  VALIUM, PREDNISONE AND SULFA MEDICATIONS.   HISTORY OF PRESENT ILLNESS AND HOSPITAL COURSE:  The patient is a 77-year-  old male who is a patient of Dr. Sherryl Manges who had been seen in the  anticoagulant clinic at [facility].  The patient had a prostate biopsy done by  Dr. Earlene Plater in which he had discontinued his Coumadin therapy on Sunday, May 01, 2006.  On the morning of Sunday, May 08, 2006, the patient had  experienced a 20-30 minute loss of vision in his right eye with a complete  resolution within an hour.  He was admitted for evaluation.  Dr. Graciela Husbands had  recommended to do some carotid Dopplers which had showed no ICA stenosis.  The patient remained in the hospital until he became therapeutic on his  Coumadin.  He was seen by Dr. Eden Emms on May 14, 2006, his atrial  fibrillation rate was okay, his vision had improved, he had some minor clots  in his urine at this point, he will be discharged home and follow his INR in  the Coumadin clinic on Tuesday at [place].  He will take 2.5 mg of Coumadin  daily.  We will also consider continuing his Cardizem 120 mg daily.  He also  has a followup appointment with Dr. Graciela Husbands in 2 months and the office will  call for that appointment.  His INR on May 13, 2006 was 2.0.  Total time was  35 minutes.     ______________________________  April Humphrey, NP    ______________________________  Charlton Haws, M.D.    AH/MEDQ  D:  05/14/2006  T:  05/14/2006  Job:  [id number]

## 2011-05-14 NOTE — Op Note (Signed)
[facility]  Patient:    [name], [name]                         MRN: [id number] Proc. Date: 08/04/01 Adm. Date:  [id number] Attending:  Nelma Rothman Iii                           Operative Report  PREOPERATIVE DIAGNOSES:  Bladder lesion, history of transitional cell carcinoma of the bladder.  POSTOPERATIVE DIAGNOSES:  Bladder lesion, history of transitional cell carcinoma of the bladder.  OPERATION PERFORMED:  Cystourethroscopy, cold cup removal and fulguration of bladder neck lesion.  SURGEON:  Lucrezia Starch. Ovidio Hanger, M.D.  ANESTHESIA:  MAC.  TUBES:  None.  COMPLICATIONS:  None.  INDICATIONS FOR PROCEDURE:  [name] is a very nice 77 year old white male who has a history of superficial transitional cell carcinoma of the bladder. He underwent surveillance cystourethroscopy and was found to have at the 7 oclock position a frondular lesion at the bladder neck suspicious for transitional cell carcinoma. The remainder of the bladder was free of tumor. After understanding the risks, benefits, and alternatives, he elected to proceed with the above procedure.  DESCRIPTION OF PROCEDURE:  The patient was placed in supine position after proper MAC anesthesia was placed in the dorsal lithotomy position and prepped and draped with Betadine in a sterile fashion. Cystourethroscopy was performed with a 22.5 [place] ACMI panendoscope utilizing 12 and 70 degree lenses. The bladder was carefully inspected. Efflux of clear urine was noted from the normally placed ureteral orifices bilaterally. Throughout the bladder, there were no lesions; however, at the bladder neck in the 7 oclock position there was a frondular area approximately 3-4 mm in diameter which was suspicious for transitional cell carcinoma. Utilizing the cold cut biopsy forceps, it was removed in its entirety and submitted for pathology. The base was cauterized thoroughly with Bugbee coagulation  cautery. No other lesions were noted at the present, the bladder was drained. The panendoscope was visually removed. The specimen was submitted to pathology and the patient was taken to the recovery room stable. DD:  08/04/01 TD:  08/04/01 Job: [id number]

## 2011-05-14 NOTE — Discharge Summary (Signed)
. [facility]  Patient:    BRACKEN, MOFFA                         MRN: [id number] Adm. Date:  [id number] Disc. Date: [id number] Attending:  Nathen May Dictator:   Chinita Pester, N.P. CC:          Health Care, Attn:  Samule Ohm. Mariel Sleet, M.D.   Discharge Summary  HISTORY OF PRESENT ILLNESS:  This is a 77 year old gentleman with recurrent atrial fibrillation.  Rate control has always been a problem.  He has always preferred to use as little medication as possible.  He has had problems tolerating Lopressor, Lanoxin, and Cardizem in the past, but he cannot remember the exact side effects.  Over the last couple of weeks he has been having spells of shortness of breath.  He feels his carotid pulse and notes a rapid rate.  He also has felt shortness of breath.  Some days are good, and he can play golf with no symptoms, but lately he has been having multiple bad days.  He came to the office on February 03, 2001, was in rapid atrial fibrillation with a rate of 188.  He declined hospitalization at that time and was given Toprol daily.  He came back to the office, and unfortunately, he was in atrial fibrillation of 180 per minute, and he only took 25 mg of the Toprol.  PAST MEDICAL HISTORY:  A cystourethroscopy and cold cup removal of the bladder, a splenectomy in 1987, history of ITP.  FAMILY HISTORY:  Noncontributory.  HOSPITAL COURSE:  The patient was admitted with rapid atrial fibrillation.  He was placed on a Cardizem drip with adequate rate control.  He was switched to p.o. Cardizem.  The patient was to have a TEE cardioversion, but this was unable to be scheduled during this hospitalization.  He was given an initial dose of 40 mg of Lasix in the emergency room without diuresis and again given Lasix 40 mg p.o. on February 13.  He was discharged to home to be readmitted for a TEE cardioversion.  DISCHARGE MEDICATIONS: 1. Coumadin  2.5 mg nightly. 2. Cardizem CD 180 mg b.i.d. 3. Lasix 20 mg q.d. x 1 week. 4. K-Dur 10 mEq q.d. x 1 week.  DISCHARGE INSTRUCTIONS:  Activity:  As tolerated.  Low-fat, low-cholesterol, no-added-salt diet.  DISCHARGE FOLLOWUP:  He is to have a PT/INR Friday at 2:15 p.m. at the [facility] in [place] office as well as a BMET.  He is to call Annice Pih at [phone] to let her know when it would be best for him to have his TEE cardioversion scheduled.  The patient needed to check his "sign" to see when it would be a good time to have this done.  CONDITION ON DISCHARGE:  The patient was discharged to home in stable condition. DD:  02/08/01 TD:  02/09/01 Job: [id number]

## 2011-05-14 NOTE — Consult Note (Signed)
[place]. [facility]  Patient:    [name], [name]                         MRN: [id number] Proc. Date: 03/01/01 Adm. Date:  [id number] Attending:  Nathen May CC:         Nathen May, M.D., [facility]   Consultation Report  CHIEF COMPLAINT:  Dyspnea and left pleural effusion.  HISTORY OF PRESENT ILLNESS:  This 77 year old, white male admitted for recurrent atrial fibrillation.  He was TEE cardioverted several weeks ago in February to sinus rhythm, now back in atrial fibrillation on February 27, 2001, thus readmitted to the hospital for a repeat DC cardioversion.  The patient now is found to have a left pleural effusion on chest x-ray here in the hospital.  We are asked to evaluate this.  The patient has dyspnea with some exertion.  Has a dry cough.  Denies any chest pain.  No previous known history of lung disease.  He is an ex-smoker.  PAST HISTORY:  Significant for a bladder cancer with removal.  Splenectomy in 1987 for history of ITP.  FAMILY HISTORY:  Noncontributory.  REVIEW OF SYSTEMS:  Noncontributory.  MEDICATIONS:  Coumadin only on admission.  Currently also on Cardizem.  PHYSICAL EXAMINATION:  VITAL SIGNS:  Temperature 96, blood pressure 130/80, pulse 80, respirations 20.  Saturation 96% room air.  CHEST:  Decreased breath sounds.  Dull to percussion one-quarter of the way up on the left, clear on the right.  CARDIAC:  Irregularly irregular rhythm.  Normal S1 and S2.  No S3 or S4.  ABDOMEN:  Soft and nontender.  Bowel sounds active.  EXTREMITIES:  No edema or clubbing.  LABORATORY DATA:  INR is 2.1, sodium 137, potassium 4.8, chloride 100, CO2 32, BUN 18, creatinine 1.2, calcium 9.1, magnesium 2.2.  White count 7.3, hemoglobin 14.1.  Chest x-ray shows a moderate left pleural effusion.  There is also mild cardiomegaly.  IMPRESSION:  Left pleural effusion to a moderate degree, likely transudative in nature due to  patients underlying cardiac disease, however, cannot rule this out.  RECOMMENDATIONS:  Will need to pursue thoracentesis at this time, however, thoracentesis is not possible because of the anticoagulation.  PLAN:  Plan is for the patient to hold Coumadin and begin Lovenox therapy. Once the INR drifts down to 1.5 or less, thoracentesis can be considered either as an inpatient or outpatient.  Will discuss with Dr. Graciela Husbands. DD:  03/01/01 TD:  03/02/01 Job: [id number]

## 2011-05-14 NOTE — Discharge Summary (Signed)
[place]. [facility]  Patient:    TYRI, ELMORE                         MRN: [id number] Adm. Date:  [id number] Disc. Date: 03/17/01 Attending:  Caleb Popp Dictator:   Earley Favor, RN, MSN, ACNP CC:         Norton Blizzard, M.D.  Nathen May, M.D., [facility]  Ronald L. Ovidio Hanger, M.D.  Maretta Bees. Vonita Moss, M.D.  Dr. Isaiah Blakes, [place]   Discharge Summary  DATE OF BIRTH:  02/11/1927  DISCHARGE DIAGNOSES: 1. Fibrothorax, status post video-assisted thoracic surgery. 2. Atrial flutter. 3. Pneumaturia.  HISTORY OF PRESENT ILLNESS:  Mr. Vanvranken is a 77 year old gentleman recently admitted to the hospital for recurrent atrial fibrillation.  At that time, he underwent a transesophageal cardioversion.  He was found to have a left pleural effusion.  He was anticoagulated at that time.  He was seen by Dr. Delford Field in consultation, who recommended holding Coumadin as an outpatient and performing a thoracentesis.  A thoracentesis was performed in the office on March 06, 2001, which yielded approximately 1 L of serosanguineous fluid. Postprocedure x-ray demonstrated a pneumothorax.  The patient was kept in the office for approximately two hours, at which time a chest x-ray revealed increased size in pneumothorax.  At that time, he was sent to [facility] for admission and insertion of a left chest tube.  PROCEDURES: 1. On March 06, 2001, a left 28-French chest tube insertion per Dr. Sung Amabile. 2. Video-assisted thoracoscopic surgery on March 10, 2001 performed by    Dr. Karle Plumber with a left thoracotomy, a left pleurectomy, and    decortication of left upper lobe and left lingula and a left lower lobe    biopsy.  LABORATORY DATA:  INR was 1.4.  Arterial blood gases on 2 L nasal cannula:  pH 7.40, PCO2 45, PO2 98.  WBC is 5.8, hemoglobin 11.4, hematocrit 35.0, platelets 254.  ESR is 62.  Sodium is 137, potassium 4.6,  chloride 101, CO2 27, glucose 90, BUN 19, creatinine 1.3, calcium 8.9.  ALT is 42, total bilirubin 0.6, ALP 111.  Pleural fluid demonstrates orange color.  Clear appearance.  WBC is 31, lymph is rare, monocytes are rare.  LDH is 442, amylase is 96.  Specific gravity 1.024.  No growth is demonstrated.  RADIOGRAPHIC DATA:  Chest x-ray March 16, 2001 shows stable left apical pneumothorax, stable left bibasilar atelectasis, stable changes of chronic obstructive pulmonary disease.  March 10, 2001 demonstrated a left-sided chest tube remains in place, left-sided pleural thickening and effusion, consolidation at the left base.  Chest x-ray on March 06, 2001 demonstrated approximately a 5% left apical pneumothorax.  Following left chest tube placement, persistent left lower atelectasis, COPD, and mild cardiomeglia. Surgical pathology demonstrates left pleural peel benign.  Pleura with mild chronic inflammatory and fibrosis with adherent fibrogenic blood in association with the hemosiderin deposits.  Cytopathology shows reactive mesothelial cells present, chronic inflammatory changes.  No definite malignant cells are identified.  A 12-lead EKG shows atrial flutter with a rapid ventricular response.  HOSPITAL COURSE: #1 - LEFT FIBROTHORAX OF QUESTIONABLE ETIOLOGY:  The patient was admitted to [facility] with a left chest tube inserted per Dr. Sung Amabile on March 06, 2001.  Chest tube remained in place until Dr. Edwyna Shell performed a left video-assisted thoracoscopic surgery with decortication.  The patient responded well to  treatment and the chest tube was removed.  One-half staples have been removed prior to discharge from the hospital.  Pneumothorax has been resolved.  He will be following up with Dr. Edwyna Shell on an outpatient basis for further evaluation and treatment.  He did not require any steroids for treatment.  No antimicrobial therapy was necessary.  He will follow up with the nurse  practitioner at the pulmonary critical care division in approximately two weeks and with Dr. Delford Field in approximately three weeks.  #2 - ATRIAL FLUTTER:  He is noted to have a history of atrial fibrillation/atrial flutter.  He had undergone a transesophageal cardioversion in the recent past.  He was placed back on Coumadin.  His INR was 1.4 on day of discharge.  He will be continued on his Coumadin on an outpatient basis and to follow up at the Coumadin clinic two days post discharge for reevaluation of his Coumadin dose and to monitor his INR.  He will be following up with Dr. Sherryl Manges at a later date to reevaluate his atrial fibrillation/flutter and for any interventions necessary at that time.  Of note, he had pneumohematuria, most likely secondary to a Foley insertion.  This was evaluated by Dr. Larey Dresser while in the hospital.  He does have a history of having bladder carcinoma by Dr. Darvin Neighbours removed in August 2001.  He will be followed up on an outpatient basis by Dr. Darvin Neighbours.  DISCHARGE MEDICATIONS: 1. Cardizem 180 mg 1 q.d. 2. Lasix 40 mg 1 q.d. 3. K-Dur 20 mEq 1 b.i.d. 4. Digoxin 0.25 mg 1 q.d. 5. Coumadin 2.5 mg 1 q.d. at 4 oclock. 6. Lopressor 50 mg 1/2 tablet b.i.d. 7. Magnesium oxide 1 q.d. 8. Percocet 1-2 tablets every 4-6 hours p.r.n. for pain.  DIET:  Low-salt diet.  WOUND CARE:  As described by CVTS, keep his incision clean and dry.  FOLLOW-UP:  He has a followup visit with Dr. Edwyna Shell, nurse practitioner [place], and with Dr. Delford Field in the near future.  He has an appointment with the Coumadin clinic on March 20, 2001 at 2:45 p.m.  DISPOSITION/CONDITION ON DISCHARGE:  His fibropneumothorax has resolved.  His  atrial fibrillation/flutter has been controlled with Cardizem, as far as rate. He will be required cardiology follow-up on an outpatient basis.  His Coumadin dose is being monitored and will be followed up by the Coumadin clinic within two days  post discharge.  All in all, he is being discharged home in improved condition.DD:  03/17/01 TD:  03/17/01 Job: [id number]

## 2011-05-14 NOTE — Op Note (Signed)
NAMEGRANTHAM, [name] NO.:  [id number]   MEDICAL RECORD NO.:  [id number]          PATIENT TYPE:  AMB   LOCATION:  [facility]                         FACILITY:  [facility]   PHYSICIAN:  Ronald L. Earlene Plater, M.D.  DATE OF BIRTH:  01-30-1927   DATE OF PROCEDURE:  05/06/2006  DATE OF DISCHARGE:                                 OPERATIVE REPORT   DIAGNOSIS:  History of bladder tumor, status post BCG in 2004 and bladder  lesion noted.   OPERATIVE PROCEDURE:  1.  Cystourethroscopy.  2.  Bladder biopsy.   SURGEON:  Gaynelle Arabian, MD   ANESTHESIA:  LMA.   ESTIMATED BLOOD LOSS:  Negligible.   TUBES:  None.   COMPLICATIONS:  None.   INDICATIONS FOR PROCEDURE:  [name] is a very nice 77 year old white  male, who has a history of transitional cell carcinoma of the bladder.  He  underwent BCG in 2004.  He is maintained on Coumadin for chronic atrial  fibrillation.  He, on cystourethroscopy, was found to have lesions in his  bladder, particularly in the right trigonal area.  There was a patchy,  slightly raised 3-4 mm, felt it could possibly be inflammation, but biopsy  was certainly indicated.  After understanding risks. benefits, and  alternatives, he has elected to proceed.  He has been seen by Dr. Shelva Majestic  and cleared for surgery.  He stopped his Coumadin, and his coagulations are  normal.   PROCEDURE IN DETAIL:  The patient was placed in supine position.  After  proper LMA anesthesia, was placed in the dorsal lithotomy position and  prepped and draped with Betadine in a sterile fashion.  Cystourethroscopy  was performed with a 22.5 [place] Olympus panendoscope.  He had mild trilobar  hypertrophy.  The bladder was smooth walled, and efflux of clear urine was  noted from the normally-placed ureteral orifices bilaterally.  He had an  approximately 3-4 mm patchy area just proximal and lateral to the right  ureteral orifice and another in the posterior midline.  These were  biopsied  with cold cup biopsy forceps and submitted separately to pathology.  The  base was cauterized with Bovie coagulation cautery.  Reinspection revealed  no further lesions.  The bladder was drained.  Good hemostasis was present;  the bladder was drained; the panendoscope was removed, and the patient was  taken to the recovery room stable.     Ronald L. Earlene Plater, M.D.  Electronically Signed    RLD/MEDQ  D:  05/06/2006  T:  05/06/2006  Job:  [id number]

## 2011-05-14 NOTE — H&P (Signed)
NAMEKYSHON, TOLLIVER NO.:  [id number]   MEDICAL RECORD NO.:  [id number]           PATIENT TYPE:   LOCATION:                                 FACILITY:   PHYSICIAN:  Duke Salvia, M.D.  DATE OF BIRTH:  01/30/1927   DATE OF ADMISSION:  05/10/2006  DATE OF DISCHARGE:                                HISTORY & PHYSICAL   PRIMARY CARDIOLOGIST:  Duke Salvia, M.D.   PRINCIPAL DIAGNOSIS:  Rule out transient ischemic attack versus amaurosis  fugax.   SECONDARY DIAGNOSIS:  Atrial fibrillation, 427.31.   ALLERGIES:  VALIUM, PREDNISONE and SULFA MEDICATIONS.   HISTORY OF PRESENT ILLNESS:  Mr. Zaccone is a pleasant 77 year old gentleman  patient of Dr. Clayborne Artist, who was seen in the anticoagulation clinic at  [facility] today.  He has had a recent prostate biopsy with Dr.  Gaynelle Arabian, for which he discontinued his warfarin anticoagulation therapy  on Sunday, May 6.  The patient underwent biopsy on Friday, May 11, and noted  no increase in hematuria until this morning.  This morning of admission the  patient has restarted his warfarin on the evening of Sunday, the 13th.  The  patient experienced a 20-30 minute episode of loss of vision in his right  eye last evening (May 09, 2006), with complete resolution within an hour of  occurrence.  The patient states that he has had no dizziness, presyncopal or  syncopal episodes, no palpitations or swelling.   PAST MEDICAL HISTORY:  1.  Atrial fibrillation/atrial flutter.      1.  Maintained on anticoagulation therapy with warfarin, followed at          [facility] anticoagulation clinic.  2.  History of bladder carcinoma.      1.  Followed by Dr. Gaynelle Arabian.  3.  Past history of splenectomy in 1987 for ITP.  4.  History of hydrothorax, status post pleurodesis.  5.  Hypertension.   CURRENT MEDICATIONS:  1.  Cardizem 120 mg p.o. daily.  2.  Warfarin as directed by the anticoagulation clinic at [facility].  The      patient's most recent noted dose includes 5 mg each Monday orally and      2.5 mg on the other days of the week.   REVIEW OF SYSTEMS:  The patient denies dizziness, presyncopal or syncopal  episodes.  He cannot tell if he has had any palpitations, though he states  that when he monitors his heart rate at the monitors, he finds fast heart  rates periodically.  The patient denies lower extremity swelling.   PHYSICAL EXAMINATION:  VITAL SIGNS:  Blood pressure today in the office is  130/76 in the right arm, heart rate is variable in the 90s, respirations are  16.  CONSTITUTIONAL:  The patient appears comfortable.  HEENT:  Pupils are round, reactive.  They react to light.  Oral and nasal  mucosa are moist without demarcation.  CHEST:  Unremarkable.  Lungs are clear to auscultation without wheeze in all  lung fields bilaterally.  CARDIOVASCULAR:  S1, S2  within normal limits.  Rate is variably variable.  ABDOMEN:  Soft, nontender, without rebound or guarding.  EXTREMITIES:  No edema, no cyanosis, 2+ pulses.  SKIN:  No rashes, no nodules.  NEUROLOGIC:  The patient is alert and oriented to person, place and time.  Demonstrates no focal deficits.  BACK:  No CVA tenderness, no kyphosis or scoliosis.  NECK:  No JVD at 90 degrees.  Wave form is within normal limits.  No  thyromegaly is appreciated.  Trachea is midline.   ASSESSMENT:  The patient has had symptomatology in the past 12-24 hours  concerning for amaurosis fugax versus transient ischemic attack.  The  patient's INR is subtherapeutic today status post recent discontinuation of  therapy for bladder biopsy.  PT is 14.1 seconds, INR is 1.4.  Additionally,  the patient continues to have bleeding associated with urination, which  complicates the patient's need for anticoagulation and appropriate  coagulation.   PLAN:  1.  After discussion, from the office the patient will be admitted for      unfractionated heparin after he has an  MRI to rule out bleed.  2.  The patient will for his hematuria have a 22 French Foley catheter      placed and will begin bladder irrigations with saline under the      direction of Dr. Gaynelle Arabian.  3.  The patient will have carotid Dopplers completed as well as an MRI to      rule out active process.  Dr. Pearlean Brownie has been consulted and will see the      patient on arrival for evaluation and further direction.  The patient      and his wife have been apprised of plans and are amenable to proceed.           ______________________________  Duke Salvia, M.D.     SCK/MEDQ  D:  05/10/2006  T:  05/10/2006  Job:  [id number]

## 2011-05-17 ENCOUNTER — Telehealth: Payer: Self-pay | Admitting: Cardiovascular Disease

## 2011-05-17 NOTE — Telephone Encounter (Signed)
Called pt so schedule Coumadin appt and new patient appt per office note from Dr. [name].  Pt was d/c from the hospital yesterday for bladder surgery.  Pt is uncertain as to whether they want to transfer care to [facility] and will call back.

## 2011-05-19 NOTE — Telephone Encounter (Signed)
Dahlia to call pt - we need to get him referred to coumadin clinic either [place] or [place];  Please ask pt to decide and let the PCC's know

## 2011-05-19 NOTE — Telephone Encounter (Signed)
FYI for Dr [name] attempted to contact pt to schedule Coumadin Clinic appt, pt is unsure if he is going to transfer care to [place]/ Coumadin Clinic. Pt states he will call back to schedule appt. At this time we are not monitoring or managing pt's Coumadin.

## 2011-05-19 NOTE — Telephone Encounter (Signed)
Left message on machine for pt to return my call  

## 2011-05-20 NOTE — Telephone Encounter (Signed)
Left message on machine for pt to return my call  

## 2011-05-21 NOTE — Telephone Encounter (Signed)
Left message on machine for pt to return my call  

## 2011-05-21 NOTE — Telephone Encounter (Signed)
Pt advised and states that he is having his coumadin check at [facility] and is satisfied there. Pt will call back if he decides to go to a different coumadin clinic.

## 2011-05-25 ENCOUNTER — Other Ambulatory Visit: Payer: Self-pay | Admitting: Dermatology

## 2011-07-15 ENCOUNTER — Other Ambulatory Visit: Payer: Self-pay | Admitting: Dermatology

## 2011-09-17 LAB — COMPREHENSIVE METABOLIC PANEL
AST: 24
CO2: 29
Calcium: 8.9
Creatinine, Ser: 1.17
GFR calc Af Amer: >60
GFR calc non Af Amer: >60
Total Protein: 7

## 2011-09-17 LAB — PROTIME-INR: Prothrombin Time: 15.4 — ABNORMAL HIGH

## 2011-09-17 LAB — CBC
MCHC: 33.3
MCV: 101 — ABNORMAL HIGH
RBC: 3.8 — ABNORMAL LOW
RDW: 14

## 2011-09-17 LAB — URINALYSIS, ROUTINE W REFLEX MICROSCOPIC
Ketones, ur: NEGATIVE
Leukocytes, UA: NEGATIVE
Nitrite: NEGATIVE
Protein, ur: NEGATIVE
pH: 6

## 2011-09-17 LAB — APTT: aPTT: 28

## 2011-09-23 LAB — POCT I-STAT, CHEM 8
BUN: 30 — ABNORMAL HIGH
Chloride: 104
Sodium: 138
TCO2: 28

## 2011-09-23 LAB — DIFFERENTIAL
Basophils Relative: 1
Eosinophils Absolute: 0
Eosinophils Relative: 1
Lymphocytes Relative: 18
Neutrophils Relative %: 68

## 2011-09-23 LAB — TROPONIN I: Troponin I: 0.01

## 2011-09-23 LAB — POCT CARDIAC MARKERS
Myoglobin, poc: 68.1
Operator id: 257131
Operator id: 257131
Operator id: 272551
Troponin i, poc: <0.05

## 2011-09-23 LAB — CBC
Hemoglobin: 13.6
RBC: 3.89 — ABNORMAL LOW

## 2011-09-23 LAB — CARDIAC PANEL(CRET KIN+CKTOT+MB+TROPI): CK, MB: 1.8

## 2011-09-23 LAB — CK TOTAL AND CKMB (NOT AT ARMC): Relative Index: INVALID

## 2011-09-23 LAB — PROTIME-INR: INR: 1.8 — ABNORMAL HIGH

## 2011-10-21 ENCOUNTER — Ambulatory Visit: Payer: Self-pay | Admitting: Pharmacist

## 2011-10-21 DIAGNOSIS — I4891 Unspecified atrial fibrillation: Secondary | ICD-10-CM

## 2011-12-07 ENCOUNTER — Other Ambulatory Visit: Payer: Self-pay | Admitting: Dermatology

## 2012-04-06 ENCOUNTER — Other Ambulatory Visit: Payer: Self-pay | Admitting: Dermatology

## 2012-06-22 ENCOUNTER — Other Ambulatory Visit: Payer: Self-pay | Admitting: Dermatology

## 2012-08-17 ENCOUNTER — Other Ambulatory Visit: Payer: Self-pay

## 2012-08-17 ENCOUNTER — Inpatient Hospital Stay (HOSPITAL_COMMUNITY): Payer: Medicare Other

## 2012-08-17 ENCOUNTER — Inpatient Hospital Stay (HOSPITAL_COMMUNITY)
Admission: EM | Admit: 2012-08-17 | Discharge: 2012-08-25 | DRG: 064 | Disposition: A | Discharge status: Rehabilitation Facility | Payer: Medicare Other | Attending: Internal Medicine | Admitting: Internal Medicine

## 2012-08-17 ENCOUNTER — Emergency Department (HOSPITAL_COMMUNITY): Payer: Medicare Other

## 2012-08-17 ENCOUNTER — Encounter (HOSPITAL_COMMUNITY): Payer: Self-pay | Admitting: Emergency Medicine

## 2012-08-17 DIAGNOSIS — D649 Anemia, unspecified: Secondary | ICD-10-CM | POA: Diagnosis present

## 2012-08-17 DIAGNOSIS — Z8673 Personal history of transient ischemic attack (TIA), and cerebral infarction without residual deficits: Secondary | ICD-10-CM

## 2012-08-17 DIAGNOSIS — Z8551 Personal history of malignant neoplasm of bladder: Secondary | ICD-10-CM

## 2012-08-17 DIAGNOSIS — I6340 Cerebral infarction due to embolism of unspecified cerebral artery: Principal | ICD-10-CM | POA: Diagnosis present

## 2012-08-17 DIAGNOSIS — M25562 Pain in left knee: Secondary | ICD-10-CM

## 2012-08-17 DIAGNOSIS — Z9081 Acquired absence of spleen: Secondary | ICD-10-CM

## 2012-08-17 DIAGNOSIS — I6381 Other cerebral infarction due to occlusion or stenosis of small artery: Secondary | ICD-10-CM

## 2012-08-17 DIAGNOSIS — J90 Pleural effusion, not elsewhere classified: Secondary | ICD-10-CM

## 2012-08-17 DIAGNOSIS — Z79899 Other long term (current) drug therapy: Secondary | ICD-10-CM

## 2012-08-17 DIAGNOSIS — I639 Cerebral infarction, unspecified: Secondary | ICD-10-CM | POA: Diagnosis present

## 2012-08-17 DIAGNOSIS — G9340 Encephalopathy, unspecified: Secondary | ICD-10-CM

## 2012-08-17 DIAGNOSIS — J4489 Other specified chronic obstructive pulmonary disease: Secondary | ICD-10-CM | POA: Diagnosis present

## 2012-08-17 DIAGNOSIS — R7989 Other specified abnormal findings of blood chemistry: Secondary | ICD-10-CM

## 2012-08-17 DIAGNOSIS — D693 Immune thrombocytopenic purpura: Secondary | ICD-10-CM

## 2012-08-17 DIAGNOSIS — E785 Hyperlipidemia, unspecified: Secondary | ICD-10-CM

## 2012-08-17 DIAGNOSIS — D72829 Elevated white blood cell count, unspecified: Secondary | ICD-10-CM | POA: Diagnosis present

## 2012-08-17 DIAGNOSIS — M1120 Other chondrocalcinosis, unspecified site: Secondary | ICD-10-CM | POA: Diagnosis present

## 2012-08-17 DIAGNOSIS — F411 Generalized anxiety disorder: Secondary | ICD-10-CM

## 2012-08-17 DIAGNOSIS — C679 Malignant neoplasm of bladder, unspecified: Secondary | ICD-10-CM

## 2012-08-17 DIAGNOSIS — N19 Unspecified kidney failure: Secondary | ICD-10-CM

## 2012-08-17 DIAGNOSIS — Z8679 Personal history of other diseases of the circulatory system: Secondary | ICD-10-CM

## 2012-08-17 DIAGNOSIS — Z7901 Long term (current) use of anticoagulants: Secondary | ICD-10-CM

## 2012-08-17 DIAGNOSIS — Z8249 Family history of ischemic heart disease and other diseases of the circulatory system: Secondary | ICD-10-CM

## 2012-08-17 DIAGNOSIS — Z888 Allergy status to other drugs, medicaments and biological substances status: Secondary | ICD-10-CM

## 2012-08-17 DIAGNOSIS — I10 Essential (primary) hypertension: Secondary | ICD-10-CM | POA: Diagnosis present

## 2012-08-17 DIAGNOSIS — R4701 Aphasia: Secondary | ICD-10-CM | POA: Diagnosis present

## 2012-08-17 DIAGNOSIS — K5900 Constipation, unspecified: Secondary | ICD-10-CM | POA: Diagnosis present

## 2012-08-17 DIAGNOSIS — J449 Chronic obstructive pulmonary disease, unspecified: Secondary | ICD-10-CM | POA: Diagnosis present

## 2012-08-17 DIAGNOSIS — Z882 Allergy status to sulfonamides status: Secondary | ICD-10-CM

## 2012-08-17 DIAGNOSIS — Z8261 Family history of arthritis: Secondary | ICD-10-CM

## 2012-08-17 DIAGNOSIS — R1310 Dysphagia, unspecified: Secondary | ICD-10-CM | POA: Diagnosis present

## 2012-08-17 DIAGNOSIS — N390 Urinary tract infection, site not specified: Secondary | ICD-10-CM | POA: Diagnosis present

## 2012-08-17 DIAGNOSIS — I6350 Cerebral infarction due to unspecified occlusion or stenosis of unspecified cerebral artery: Secondary | ICD-10-CM

## 2012-08-17 DIAGNOSIS — M109 Gout, unspecified: Secondary | ICD-10-CM | POA: Diagnosis present

## 2012-08-17 DIAGNOSIS — I4891 Unspecified atrial fibrillation: Secondary | ICD-10-CM

## 2012-08-17 DIAGNOSIS — G8190 Hemiplegia, unspecified affecting unspecified side: Secondary | ICD-10-CM | POA: Diagnosis present

## 2012-08-17 LAB — URINALYSIS, ROUTINE W REFLEX MICROSCOPIC
Bilirubin Urine: NEGATIVE
Glucose, UA: NEGATIVE mg/dL
Ketones, ur: NEGATIVE mg/dL
Protein, ur: NEGATIVE mg/dL
pH: 7 (ref 5.0–8.0)

## 2012-08-17 LAB — CBC WITH DIFFERENTIAL/PLATELET
Basophils Absolute: 0 K/uL (ref 0.0–0.1)
Basophils Relative: 1 % (ref 0–1)
Eosinophils Absolute: 0 K/uL (ref 0.0–0.7)
Eosinophils Relative: 0 % (ref 0–5)
HCT: 38.5 % — ABNORMAL LOW (ref 39.0–52.0)
Hemoglobin: 13 g/dL (ref 13.0–17.0)
Lymphocytes Relative: 15 % (ref 12–46)
Lymphs Abs: 0.8 10*3/uL (ref 0.7–4.0)
MCH: 33.7 pg (ref 26.0–34.0)
MCHC: 33.8 g/dL (ref 30.0–36.0)
MCV: 99.7 fL (ref 78.0–100.0)
Monocytes Absolute: 0.7 10*3/uL (ref 0.1–1.0)
Monocytes Relative: 12 % (ref 3–12)
Neutro Abs: 4 10*3/uL (ref 1.7–7.7)
Neutrophils Relative %: 72 % (ref 43–77)
Platelets: 149 K/uL — ABNORMAL LOW (ref 150–400)
RBC: 3.86 MIL/uL — ABNORMAL LOW (ref 4.22–5.81)
RDW: 14.2 % (ref 11.5–15.5)
WBC: 5.6 10*3/uL (ref 4.0–10.5)

## 2012-08-17 LAB — BASIC METABOLIC PANEL WITH GFR
Calcium: 9.6 mg/dL (ref 8.4–10.5)
GFR calc Af Amer: 72 mL/min — ABNORMAL LOW (ref >90)
GFR calc non Af Amer: 62 mL/min — ABNORMAL LOW (ref >90)
Potassium: 4.7 meq/L (ref 3.5–5.1)
Sodium: 139 meq/L (ref 135–145)

## 2012-08-17 LAB — PROTIME-INR
INR: 1.7 — ABNORMAL HIGH (ref 0.00–1.49)
Prothrombin Time: 20.3 s — ABNORMAL HIGH (ref 11.6–15.2)

## 2012-08-17 LAB — BASIC METABOLIC PANEL
BUN: 32 mg/dL — ABNORMAL HIGH (ref 6–23)
CO2: 32 mEq/L (ref 19–32)
Chloride: 100 mEq/L (ref 96–112)
Creatinine, Ser: 1.08 mg/dL (ref 0.50–1.35)
Glucose, Bld: 84 mg/dL (ref 70–99)

## 2012-08-17 LAB — URINE MICROSCOPIC-ADD ON

## 2012-08-17 LAB — APTT: aPTT: 31 seconds (ref 24–37)

## 2012-08-17 MED ORDER — DILTIAZEM HCL 25 MG/5ML IV SOLN
10.0000 mg | Freq: Once | INTRAVENOUS | Status: AC
  Administered 2012-08-17: 10 mg via INTRAVENOUS

## 2012-08-17 MED ORDER — WARFARIN SODIUM 7.5 MG PO TABS
7.5000 mg | ORAL_TABLET | Freq: Once | ORAL | Status: AC
  Administered 2012-08-17: 7.5 mg via ORAL
  Filled 2012-08-17: qty 1

## 2012-08-17 MED ORDER — DILTIAZEM HCL 100 MG IV SOLR
5.0000 mg/h | Freq: Once | INTRAVENOUS | Status: AC
  Administered 2012-08-17: 5 mg/h via INTRAVENOUS
  Filled 2012-08-17: qty 100

## 2012-08-17 MED ORDER — DILTIAZEM HCL 100 MG IV SOLR
5.0000 mg/h | INTRAVENOUS | Status: DC
  Administered 2012-08-18 (×2): 5 mg/h via INTRAVENOUS
  Filled 2012-08-17 (×2): qty 100

## 2012-08-17 MED ORDER — SODIUM CHLORIDE 0.9 % IV BOLUS (SEPSIS)
500.0000 mL | Freq: Once | INTRAVENOUS | Status: AC
  Administered 2012-08-17: 500 mL via INTRAVENOUS

## 2012-08-17 MED ORDER — WARFARIN - PHARMACIST DOSING INPATIENT
Freq: Every day | Status: DC
  Administered 2012-08-17: 19:00:00

## 2012-08-17 MED ORDER — DOCUSATE SODIUM 100 MG PO CAPS
100.0000 mg | ORAL_CAPSULE | Freq: Two times a day (BID) | ORAL | Status: DC
  Administered 2012-08-18 – 2012-08-25 (×14): 100 mg via ORAL
  Filled 2012-08-17 (×17): qty 1

## 2012-08-17 MED ORDER — SODIUM CHLORIDE 0.9 % IV SOLN: 250.0000 mL | INTRAVENOUS | Status: DC | PRN

## 2012-08-17 MED ORDER — SODIUM CHLORIDE 0.9 % IJ SOLN: 3.0000 mL | INTRAMUSCULAR | Status: DC | PRN

## 2012-08-17 MED ORDER — DILTIAZEM HCL 25 MG/5ML IV SOLN: 15.0000 mg | Freq: Once | INTRAVENOUS | Status: DC

## 2012-08-17 MED ORDER — SODIUM CHLORIDE 0.9 % IJ SOLN
3.0000 mL | Freq: Two times a day (BID) | INTRAMUSCULAR | Status: DC
  Administered 2012-08-19 – 2012-08-24 (×8): 3 mL via INTRAVENOUS

## 2012-08-17 MED ORDER — SENNOSIDES-DOCUSATE SODIUM 8.6-50 MG PO TABS
1.0000 | ORAL_TABLET | Freq: Every evening | ORAL | Status: DC | PRN
  Administered 2012-08-23: 1 via ORAL
  Filled 2012-08-17: qty 1

## 2012-08-17 NOTE — Consult Note (Signed)
Admission H&P    Chief Complaint: slurred speech HPI: [name] is an 78 y.o. male who developed slurred speech during breakfast with his wife around 7am today. He went to bed feeling "normal", then woke up to urinate "just after midnight". He says that he awoke fine, but developed above problem. Denies any weakness in extremities or with thought processes. Wife is present.  He does state that he has been in afib chronically and is on coumadin managed by the [facility] in [facility]. He see's Arnette Felts, PA there. The wife states that he had an echo last month, but he does not know results. In the ER he was not rate controlled and was placed on Cardizem.  Wife states that he has some bleeding around his penis. She thinks it is with urination. She notes blood on his underwear.  LSN: [id number] tPA Given: No: Out of window. INR 1.7   Past Medical History  Diagnosis Date  . NEOPLASM, MALIGNANT, BLADDER 04/10/2009  . HYPERLIPIDEMIA 04/10/2009  . ANEMIA-NOS 04/10/2009  . 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 ANXIETY 04/10/2009  . HYPERTENSION 04/10/2009  . Atrial fibrillation 04/10/2009  . COPD 04/10/2009  . TRANSIENT ISCHEMIC ATTACK, HX OF 04/10/2009  . Bladder cancer     Dr. Darvin Neighbours  . Lacunar stroke 04/07/2011  . Pleural effusion 04/07/2011  . 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 Splenectomy   . Bilateral vats ablation   . Facial cancer     facial skin cancer    Family History  Problem Relation Age of Onset  . Heart disease Mother   . Arthritis Father    Social History:  reports that he has never smoked. He does not have any smokeless tobacco history on file. He reports that he does not drink alcohol. His drug history not on file.  Allergies:  Allergies  Allergen Reactions  . Diazepam     REACTION: agitation  . Ezetimibe-Simvastatin   . Morphine Other (See Comments)    Hallucinations.  . Sulfonamide Derivatives Hives   Scheduled  Meds:   . diltiazem (CARDIZEM) infusion  5-15 mg/hr Intravenous Once  . diltiazem  10 mg Intravenous Once  . sodium chloride  500 mL Intravenous Once  . DISCONTD: diltiazem  15 mg Intravenous Once   Home Meds:  -Vitamin D 2000units daily  -Dilacor XR 120mg  daily  -Warfarin 5mg  daily except 2.5mg  Mondays only.  ROS: History obtained from chart review, the patient. Wife not in room currently.  General ROS: negative for - chills, fatigue, fever, night sweats, weight gain or weight loss Psychological ROS: negative for - behavioral disorder, hallucinations, memory difficulties, mood swings or suicidal ideation Ophthalmic ROS: negative for - blurry vision, double vision, eye pain or loss of vision ENT ROS: negative for - epistaxis, nasal discharge, oral lesions, sore throat, tinnitus or vertigo Allergy and Immunology ROS: negative for - hives or itchy/watery eyes Hematological and Lymphatic ROS: negative for - bleeding problems, bruising or swollen lymph nodes Endocrine ROS: negative for - galactorrhea, hair pattern changes, polydipsia/polyuria or temperature intolerance Respiratory ROS: negative for - cough, hemoptysis, shortness of breath or wheezing Cardiovascular ROS: negative for - chest pain, dyspnea on exertion, edema or irregular heartbeat Gastrointestinal ROS: negative for - abdominal pain, diarrhea, hematemesis, nausea/vomiting or stool incontinence Genito-Urinary ROS: negative for - dysuria, hematuria, incontinence or urinary frequency/urgency Musculoskeletal ROS: negative for - joint swelling or muscular weakness Neurological ROS: as noted in HPI  Dermatological ROS: negative for rash and skin lesion changes    Physical Examination: Blood pressure 112/84, pulse 95, temperature 98 F (36.7 C), temperature source Oral, resp. rate 25, SpO2 99.00%.  HEENT-  Normocephalic, no lesions, without obvious abnormality.  Normal external eye and conjunctiva.  Normal TM's bilaterally.   Normal auditory canals and external ears. Normal external nose, mucus membranes and septum.  Normal pharynx. Neck supple with no masses, nodes, nodules or enlargement. Cardiovascular - irregularly irregular rhythm Lungs - chest clear, no wheezing, rales, normal symmetric air entry, Heart exam - S1, S2 normal, no murmur, no gallop, rate regular Abdomen - soft, non-tender; bowel sounds normal; no masses,  no organomegaly Extremities - no cyanosis  Neurologic Examination: Mental Status: Alert, oriented, thought content appropriate. Evidence of mild aphasia.  Able to follow 3 step commands without difficulty. Cranial Nerves: II: visual fields grossly normal, pupils equal, round, reactive to light and accommodation III,IV, VI: ptosis not present, extra-ocular motions intact bilaterally V,VII: smile asymmetric VIII: hearing normal bilaterally IX,X: gag reflex present XI: trapezius strength/neck flexion strength normal bilaterally XII: tongue strength normal  Motor: Right : Upper extremity   5/5    Left:     Upper extremity   4+/5  Lower extremity   5/5     Lower extremity   4+/5 Tone and bulk:normal tone throughout; atrophy noted UE/LE bilaterally Sensory: Pinprick and light touch intact throughout, bilaterally Deep Tendon Reflexes: 2+ and symmetric throughout Plantars: Right: downgoing   Left: downgoing Cerebellar: normal finger-to-nose, normal rapid alternating movements and normal heel-to-shin test normal gait and station    Results for orders placed during the hospital encounter of 08/17/12 (from the past 48 hour(s))  CBC WITH DIFFERENTIAL     Status: Abnormal   Collection Time   08/17/12 11:21 AM      Component Value Range Comment   WBC 5.6  4.0 - 10.5 K/uL    RBC 3.86 (*) 4.22 - 5.81 MIL/uL    Hemoglobin 13.0  13.0 - 17.0 g/dL    HCT 16.1 (*) 09.6 - 52.0 %    MCV 99.7  78.0 - 100.0 fL    MCH 33.7  26.0 - 34.0 pg    MCHC 33.8  30.0 - 36.0 g/dL    RDW 04.5  40.9 - 81.1 %     Platelets 149 (*) 150 - 400 K/uL    Neutrophils Relative 72  43 - 77 %    Neutro Abs 4.0  1.7 - 7.7 K/uL    Lymphocytes Relative 15  12 - 46 %    Lymphs Abs 0.8  0.7 - 4.0 K/uL    Monocytes Relative 12  3 - 12 %    Monocytes Absolute 0.7  0.1 - 1.0 K/uL    Eosinophils Relative 0  0 - 5 %    Eosinophils Absolute 0.0  0.0 - 0.7 K/uL    Basophils Relative 1  0 - 1 %    Basophils Absolute 0.0  0.0 - 0.1 K/uL   BASIC METABOLIC PANEL     Status: Abnormal   Collection Time   08/17/12 11:21 AM      Component Value Range Comment   Sodium 139  135 - 145 mEq/L    Potassium 4.7  3.5 - 5.1 mEq/L    Chloride 100  96 - 112 mEq/L    CO2 32  19 - 32 mEq/L    Glucose, Bld 84  70 - 99 mg/dL  BUN 32 (*) 6 - 23 mg/dL    Creatinine, Ser 2.13  0.50 - 1.35 mg/dL    Calcium 9.6  8.4 - 08.6 mg/dL    GFR calc non Af Amer 62 (*) >90 mL/min    GFR calc Af Amer 72 (*) >90 mL/min   PROTIME-INR     Status: Abnormal   Collection Time   08/17/12 11:21 AM      Component Value Range Comment   Prothrombin Time 20.3 (*) 11.6 - 15.2 seconds    INR 1.70 (*) 0.00 - 1.49   APTT     Status: Normal   Collection Time   08/17/12 11:21 AM      Component Value Range Comment   aPTT 31  24 - 37 seconds    Ct Head Wo Contrast 08/17/2012   1. No acute intracranial abnormality. 2.  Chronic small vessel ischemia with progression since 2012.   Original Report Authenticated By: Harley Hallmark, M.D.    Mr [name] Wo Contrast 08/17/2012    Small non hemorrhagic acute infarct extends from the posterior superior right lenticular nucleus into the posterior right corona radiata.  Remote small left corona radiata infarct.  Remote tiny right thalamic infarct.  Remote small caudate head infarcts.  Remote small right globus pallidus infarct.  Small vessel disease type changes.  No intracranial hemorrhage.  Probable developmental venous anomaly left cerebellum.  Otherwise no evidence of intracranial mass lesion noted on this unenhanced exam.   Global atrophy without hydrocephalus.  IMPRESSION:  Small non hemorrhagic acute infarct extends from the posterior superior right lenticular nucleus into the posterior right corona radiata.  Remote infarcts and small vessel disease type changes as noted above.  Probable small developmental venous anomaly left cerebellum.  Global atrophy.  Minimal mucosal thickening paranasal sinuses and partial opacification right mastoid air cells.  MRA HEAD  Findings: Fusiform ectasia of the distal vertical segment of the left internal carotid artery.  The internal carotid arteries are ectatic bilaterally.  Prominence of the right posterior communicating artery without discrete saccular aneurysm.  Anterior circulation without medium or large size vessel significant stenosis or occlusion.  Middle cerebral artery moderate to marked branch vessel irregularity bilaterally.  Ectatic vertebral arteries and basilar artery.  Left vertebral artery is dominant in size.  Mild narrowing and irregularity of the vertebral arteries more prominent on the right.  Moderate narrowing and irregularity of the PICA bilaterally.  Ectatic basilar artery with mild irregularity and narrowing without high-grade stenosis.  Non visualized AICAs.  Prominent superior cerebellar arteries.  Posterior cerebral artery moderate to marked distal branch vessel irregularity.  IMPRESSION: Intracranial atherosclerotic type changes as detailed above.  This is most notable involving branch vessels.  Critical Value/emergent results were called by telephone at the time of interpretation on 08/17/2012 at 2:00 p.m. to Dr. Oletta Lamas, who verbally acknowledged these results.   Original Report Authenticated By: Fuller Canada, M.D.    Mr Brain Wo Contrast  08/17/2012  .  MRI HEAD  Findings:  Small non hemorrhagic acute infarct extends from the posterior superior right lenticular nucleus into the posterior right corona radiata.  Remote small left corona radiata infarct.  Remote  tiny right thalamic infarct.  Remote small caudate head infarcts.  Remote small right globus pallidus infarct.  Small vessel disease type changes.  No intracranial hemorrhage.  Probable developmental venous anomaly left cerebellum.  Otherwise no evidence of intracranial mass lesion noted on this unenhanced exam.  Global atrophy  without hydrocephalus.  IMPRESSION:  Small non hemorrhagic acute infarct extends from the posterior superior right lenticular nucleus into the posterior right corona radiata.  Remote infarcts and small vessel disease type changes as noted above.  Probable small developmental venous anomaly left cerebellum.  Global atrophy.  Minimal mucosal thickening paranasal sinuses and partial opacification right mastoid air cells.  MRA HEAD  Findings: Fusiform ectasia of the distal vertical segment of the left internal carotid artery.  The internal carotid arteries are ectatic bilaterally.  Prominence of the right posterior communicating artery without discrete saccular aneurysm.  Anterior circulation without medium or large size vessel significant stenosis or occlusion.  Middle cerebral artery moderate to marked branch vessel irregularity bilaterally.  Ectatic vertebral arteries and basilar artery.  Left vertebral artery is dominant in size.  Mild narrowing and irregularity of the vertebral arteries more prominent on the right.  Moderate narrowing and irregularity of the PICA bilaterally.  Ectatic basilar artery with mild irregularity and narrowing without high-grade stenosis.  Non visualized AICAs.  Prominent superior cerebellar arteries.  Posterior cerebral artery moderate to marked distal branch vessel irregularity.  IMPRESSION: Intracranial atherosclerotic type changes as detailed above.  This is most notable involving branch vessels.  Critical Value/emergent results were called by telephone at the time of interpretation on 08/17/2012 at 2:00 p.m. to Dr. Oletta Lamas, who verbally acknowledged these results.    Original Report Authenticated By: Fuller Canada, M.D.     Assessment: 78 y.o. male developed slurred speech at 0700. MRI confirms sll non hemorrhagic acute infarct extends from the posterior superior right lenticular nucleus into the posterior right corona radiata. The study does show that it appears that he has had a remote small left corona radiata infarct.  He is in afib with RVR and has been placed on Cardizem gtt.  His INR is sub-therapeutic on coumadin.    Stroke Risk Factors - atrial fibrillation  Plan: 1. HgbA1c, fasting lipid panel 2. MRI, MRA  of the brain without contrast 3. PT consult, OT consult, Speech consult 4. Echocardiogram-obtain from [place] 5. Carotid dopplers 6. Prophylactic therapy-Anticoagulation: Coumadin. Rec to continue and bridge with heparin. Would need to check u/a for RBC and workup for this. No signs of active bleeding in the groin.  7. Risk factor modification 7. Telemetry monitoring   Guy Franco, [facility],  MBA, [facility] Pager: [phone] 08/17/2012 4:18 PM

## 2012-08-17 NOTE — Progress Notes (Addendum)
Disposition Note  [name], is a 78 y.o. male,   MRN: [id number]  -  DOB - August 14, 1927  Outpatient Primary MD for the patient is Oliver Barre, MD   Blood pressure 112/84, pulse 95, temperature 97.7 F (36.5 C), temperature source Oral, resp. rate 25, SpO2 99.00%.  Active Problems:  HYPERLIPIDEMIA  ANEMIA-NOS  HYPERTENSION  Atrial fibrillation with RVR  COPD  TRANSIENT ISCHEMIC ATTACK, HX OF  Post-splenectomy  Long term current use of anticoagulant  CVA (cerebral vascular accident)-Acute right small nonhemorrhagic infarct seen on MRI   Elderly gentleman brought to the hospital today because of confusion dysphonia and aphasia. Last seen normal around 1 AM. Issues overnight with incontinence and garbled speech. Has a history of remote CVA. Patient is on chronic Coumadin anticoagulation for atrial fibrillation. Of note at presentation patient's INR was sub-therapeutic. In addition to the neurological symptoms patient also developed rapid ventricular response while here that prompted the emergency room physician to begin a low-dose Cardizem IV infusion. An MRI of the brain was completed that showed a small right nonhemorrhagic acute infarct. The emergency room physician Dr. Oletta Lamas has notified neurology and Dr. Roseanne Reno will be seeing this patient today.Because of acute stroke with nature fibrillation RVR requiring IV Cardizem I have asked emergency room physician to place the patient in a step down bed. I've also asked him to institute temporary admission orders. I have notified the flow manager of this admission.  I have interviewed and examined the patient and also spoken to his wife. She notices maybe slight improvement in the previously mentioned neurological symptoms. She endorses that he has apparently failed a bedside swallowing evaluation. In addition there appears to be some issues with effective maintenance of therapeutic INR and I did mention to the patient and his wife that there may be  other drug options but will defer this to the admitting physician and/or the neurologist. On clinical exam he is stable and in no acute distress.  Junious Silk, ANP

## 2012-08-17 NOTE — Progress Notes (Signed)
ANTICOAGULATION CONSULT NOTE - Initial Consult  Pharmacy Consult for Coumadin Indication: atrial fibrillation and stroke  Allergies  Allergen Reactions  . Diazepam     REACTION: agitation  . Ezetimibe-Simvastatin   . Morphine Other (See Comments)    Hallucinations.  . Sulfonamide Derivatives Hives    Vital Signs: Temp: 97.7 F (36.5 C) (08/22 1549) Temp src: Oral (08/22 0951) BP: 125/84 mmHg (08/22 1645) Pulse Rate: 78  (08/22 1630)  Labs:  Basename 08/17/12 1121  HGB 13.0  HCT 38.5*  PLT 149*  APTT 31  LABPROT 20.3*  INR 1.70*  HEPARINUNFRC --  CREATININE 1.08  CKTOTAL --  CKMB --  TROPONINI --    The CrCl is unknown because both a height and weight (above a minimum accepted value) are required for this calculation.   Medical History: Past Medical History  Diagnosis Date  . NEOPLASM, MALIGNANT, BLADDER 04/10/2009  . HYPERLIPIDEMIA 04/10/2009  . ANEMIA-NOS 04/10/2009  . 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 ANXIETY 04/10/2009  . HYPERTENSION 04/10/2009  . Atrial fibrillation 04/10/2009  . COPD 04/10/2009  . TRANSIENT ISCHEMIC ATTACK, HX OF 04/10/2009  . Bladder cancer     Dr. Darvin Neighbours  . Lacunar stroke 04/07/2011  . Pleural effusion 04/07/2011  . 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 Cholecalciferol (VITAMIN D) 2000 UNITS tablet Take 2,000-3,000 Units by mouth daily as needed. Patient takes 2000 units daily, except for taking 3000 units (1.5 tablets) on "winter days" and days when "not feeling well."      . diltiazem (DILACOR XR) 120 MG 24 hr capsule Take 120 mg by mouth daily.      Marland Kitchen warfarin (COUMADIN) 5 MG tablet Take 2.5-5 mg by mouth daily. Take 5mg  every day, except take 2.5mg  on Mondays.        Assessment: 78 y/o male patient admitted with acute non-hemorrhagic stroke, on chronic coumadin for h/o stroke/afib. INR subtherapeutic, has not taken dose today. Plan to continue  coumadin. Will give higher dose today to get therapeutic.  Goal of Therapy:  INR 2-3 Monitor platelets by anticoagulation protocol: Yes   Plan:  Coumadin 7.5mg  today and f/u daily protime.  Verlene Mayer, PharmD, BCPS Pager [phone] 08/17/2012,5:18 PM

## 2012-08-17 NOTE — H&P (Signed)
[facility] History and Physical  [name] ZOX:[id number] DOB: 1927-10-24 DOA: 08/17/2012   PCP: Oliver Barre, MD   Chief Complaint: slurred speech, drooling.   HPI:  78 year old with PMH significant for A fib on chronic coumadin, prior history of stroke, ho bladder cancer, who presents to ED accompanied by wife due to slurred speech, drooling and episode of incontinence. Last time patient was seen normal was day prior to admission around at 10 pm. Wife relates that he wake up at 12 am to go to bathroom , but he had episode of incontinence, his speech was slurred and he was some quite confused. He went to bed and wake up in the morning with drooling, and speech was still slurred. Patient speech is more clear now, he is oriented to place, person and time. He denies focal weakness, he does relates some blurry vision day prior to admission. He has occasional blurry vision he is suppose to follow with opthalmology. He has been having some problem with constipation over last week, but had BM yesterday. He also relates post nasal drip, no cough.   His cardiologist Dr Arnette Felts at [facility].   Review of Systems:  Constitutional:  No weight loss, night sweats, Fevers, chills, fatigue.  HEENT:  No headaches, Difficulty swallowing,Tooth/dental problems,Sore throat,  No sneezing, itching, ear ache,  Cardio-vascular:  No chest pain, Orthopnea, PND, swelling in lower extremities, anasarca, dizziness, palpitations  GI:  No heartburn, indigestion, abdominal pain, nausea, vomiting, diarrhea, change in bowel habits, loss of appetite  Resp:  No shortness of breath with exertion or at rest. No excess mucus, no productive cough, No non-productive cough, No coughing up of blood.No change in color of mucus.No wheezing.No chest wall deformity  Skin:  no rash or lesions.  GU:  no dysuria, change in color of urine, no urgency or frequency. No flank pain.  Musculoskeletal:  No joint pain or  swelling. No decreased range of motion. No back pain.  Psych:  No change in mood or affect. No depression or anxiety. No memory loss.    Past Medical History  Diagnosis Date  . NEOPLASM, MALIGNANT, BLADDER 04/10/2009  . HYPERLIPIDEMIA 04/10/2009  . ANEMIA-NOS 04/10/2009  . 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 ANXIETY 04/10/2009  . HYPERTENSION 04/10/2009  . Atrial fibrillation 04/10/2009  . COPD 04/10/2009  . TRANSIENT ISCHEMIC ATTACK, HX OF 04/10/2009  . Bladder cancer     Dr. Darvin Neighbours  . Lacunar stroke 04/07/2011  . Pleural effusion 04/07/2011  . 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 Splenectomy   . Bilateral vats ablation   . Facial cancer     facial skin cancer   Social History:  reports that he has never smoked. He does not have any smokeless tobacco history on file. He reports that he does not drink alcohol. His drug history not on file.  Allergies  Allergen Reactions  . Diazepam     REACTION: agitation  . Ezetimibe-Simvastatin   . Morphine Other (See Comments)    Hallucinations.  . Sulfonamide Derivatives Hives    Family History  Problem Relation Age of Onset  . Heart disease Mother   . Arthritis Father     Prior to Admission medications   Medication Sig Start Date End Date Taking? Authorizing Provider  Cholecalciferol (VITAMIN D) 2000 UNITS tablet Take 2,000-3,000 Units by mouth daily as needed. Patient takes 2000 units daily, except for taking 3000 units (1.5 tablets)  on "winter days" and days when "not feeling well."   Yes Historical Provider, MD  diltiazem (DILACOR XR) 120 MG 24 hr capsule Take 120 mg by mouth daily.   Yes Historical Provider, MD  warfarin (COUMADIN) 5 MG tablet Take 2.5-5 mg by mouth daily. Take 5mg  every day, except take 2.5mg  on Mondays.   Yes Historical Provider, MD   Physical Exam: Filed Vitals:   08/17/12 1600 08/17/12 1615 08/17/12 1630 08/17/12 1645  BP: 130/88 119/73 123/69 125/84  Pulse:   78    Temp:      TempSrc:      Resp: 15 20 14 19   SpO2:   97%    BP 125/84  Pulse 78  Temp 97.7 F (36.5 C) (Oral)  Resp 19  SpO2 97%  General Appearance:    Alert, cooperative, no distress, appears stated age  Head:    Normocephalic, without obvious abnormality, atraumatic  Eyes:    PERRL, conjunctiva/corneas clear, EOM's intact,       Ears:    Normal TM's and external ear canals, both ears  Nose:   Nares normal, septum midline, mucosa normal, no drainage    or sinus tenderness  Throat:   Lips, mucosa, and tongue normal; teeth and gums normal  Neck:   Supple, symmetrical, trachea midline, no adenopathy;       thyroid:  No enlargement/tenderness/nodules; no carotid   bruit or JVD  Back:     Symmetric, no curvature, ROM normal, no CVA tenderness  Lungs:     Clear to auscultation bilaterally, respirations unlabored  Chest wall:    No tenderness or deformity  Heart:    IRRegular rate and rhythm, S1 and S2 normal, no murmur, rub   or gallop  Abdomen:     Soft, non-tender, bowel sounds active all four quadrants,    no masses, no organomegaly        Extremities:   Extremities normal, atraumatic, no cyanosis or edema  Pulses:   2+ and symmetric all extremities  Skin:   Skin color, texture, turgor normal, no rashes or lesions  Lymph nodes:   Cervical, supraclavicular, and axillary nodes normal  Neurologic:   CNII-XII intact. Normal strength, sensation and reflexes      Throughout, speech more clear, mild flattening of left  naso-labial fold.     Labs on Admission:  Basic Metabolic Panel:  Lab 08/17/12 1610  NA 139  K 4.7  CL 100  CO2 32  GLUCOSE 84  BUN 32*  CREATININE 1.08  CALCIUM 9.6  MG --  PHOS --   CBC:  Lab 08/17/12 1121  WBC 5.6  NEUTROABS 4.0  HGB 13.0  HCT 38.5*  MCV 99.7  PLT 149*    Radiological Exams on Admission: Ct Head Wo Contrast  08/17/2012  *RADIOLOGY REPORT*  Clinical Data: 78 year old male with slurred speech.  CT HEAD WITHOUT CONTRAST   Technique:  Contiguous axial images were obtained from the base of the skull through the vertex without contrast.  Comparison: Brain MRI 03/31/2011 and earlier.  CT 03/30/2011.  Findings: Visualized paranasal sinuses and mastoids are clear. Visualized orbits and scalp soft tissues are within normal limits.  Stable calvarium with chronic venous vasculature greater on the right. No acute osseous abnormality identified.  Calcified atherosclerosis at the skull base.  Stable chronic linear left corona radiata lacunar infarct. Stable chronic bilateral caudate lacunar infarcts.  New hypodensity at the genu of the right internal capsule, appears chronic.  Patchy  additional bilateral cerebral white matter hypodensity. No evidence of cortically based acute infarction identified.  No acute intracranial hemorrhage identified.  No midline shift, mass effect, or evidence of mass lesion.  No ventriculomegaly. No suspicious intracranial vascular hyperdensity.  IMPRESSION: 1. No acute intracranial abnormality. 2.  Chronic small vessel ischemia with progression since 2012.   Original Report Authenticated By: Harley Hallmark, M.D.    Mr [name] Wo Contrast  08/17/2012  *RADIOLOGY REPORT*  Clinical Data:  Slurred speech.  MRI BRAIN WITHOUT CONTRAST MRA HEAD WITHOUT CONTRAST  Technique: Multiplanar, multiecho pulse sequences of the brain and surrounding structures were obtained according to standard protocol without intravenous contrast.  Angiographic images of the head were obtained using MRA technique without contrast.  Comparison: 08/17/2012 CT.  MRI HEAD  Findings:  Small non hemorrhagic acute infarct extends from the posterior superior right lenticular nucleus into the posterior right corona radiata.  Remote small left corona radiata infarct.  Remote tiny right thalamic infarct.  Remote small caudate head infarcts.  Remote small right globus pallidus infarct.  Small vessel disease type changes.  No intracranial hemorrhage.   Probable developmental venous anomaly left cerebellum.  Otherwise no evidence of intracranial mass lesion noted on this unenhanced exam.  Global atrophy without hydrocephalus.  IMPRESSION:  Small non hemorrhagic acute infarct extends from the posterior superior right lenticular nucleus into the posterior right corona radiata.  Remote infarcts and small vessel disease type changes as noted above.  Probable small developmental venous anomaly left cerebellum.  Global atrophy.  Minimal mucosal thickening paranasal sinuses and partial opacification right mastoid air cells.  MRA HEAD  Findings: Fusiform ectasia of the distal vertical segment of the left internal carotid artery.  The internal carotid arteries are ectatic bilaterally.  Prominence of the right posterior communicating artery without discrete saccular aneurysm.  Anterior circulation without medium or large size vessel significant stenosis or occlusion.  Middle cerebral artery moderate to marked branch vessel irregularity bilaterally.  Ectatic vertebral arteries and basilar artery.  Left vertebral artery is dominant in size.  Mild narrowing and irregularity of the vertebral arteries more prominent on the right.  Moderate narrowing and irregularity of the PICA bilaterally.  Ectatic basilar artery with mild irregularity and narrowing without high-grade stenosis.  Non visualized AICAs.  Prominent superior cerebellar arteries.  Posterior cerebral artery moderate to marked distal branch vessel irregularity.  IMPRESSION: Intracranial atherosclerotic type changes as detailed above.  This is most notable involving branch vessels.  Critical Value/emergent results were called by telephone at the time of interpretation on 08/17/2012 at 2:00 p.m. to Dr. Oletta Lamas, who verbally acknowledged these results.   Original Report Authenticated By: Fuller Canada, M.D.    Mr Brain Wo Contrast  08/17/2012  *RADIOLOGY REPORT*  Clinical Data:  Slurred speech.  MRI BRAIN WITHOUT  CONTRAST MRA HEAD WITHOUT CONTRAST  Technique: Multiplanar, multiecho pulse sequences of the brain and surrounding structures were obtained according to standard protocol without intravenous contrast.  Angiographic images of the head were obtained using MRA technique without contrast.  Comparison: 08/17/2012 CT.  MRI HEAD  Findings:  Small non hemorrhagic acute infarct extends from the posterior superior right lenticular nucleus into the posterior right corona radiata.  Remote small left corona radiata infarct.  Remote tiny right thalamic infarct.  Remote small caudate head infarcts.  Remote small right globus pallidus infarct.  Small vessel disease type changes.  No intracranial hemorrhage.  Probable developmental venous anomaly left cerebellum.  Otherwise no evidence  of intracranial mass lesion noted on this unenhanced exam.  Global atrophy without hydrocephalus.  IMPRESSION:  Small non hemorrhagic acute infarct extends from the posterior superior right lenticular nucleus into the posterior right corona radiata.  Remote infarcts and small vessel disease type changes as noted above.  Probable small developmental venous anomaly left cerebellum.  Global atrophy.  Minimal mucosal thickening paranasal sinuses and partial opacification right mastoid air cells.  MRA HEAD  Findings: Fusiform ectasia of the distal vertical segment of the left internal carotid artery.  The internal carotid arteries are ectatic bilaterally.  Prominence of the right posterior communicating artery without discrete saccular aneurysm.  Anterior circulation without medium or large size vessel significant stenosis or occlusion.  Middle cerebral artery moderate to marked branch vessel irregularity bilaterally.  Ectatic vertebral arteries and basilar artery.  Left vertebral artery is dominant in size.  Mild narrowing and irregularity of the vertebral arteries more prominent on the right.  Moderate narrowing and irregularity of the PICA bilaterally.   Ectatic basilar artery with mild irregularity and narrowing without high-grade stenosis.  Non visualized AICAs.  Prominent superior cerebellar arteries.  Posterior cerebral artery moderate to marked distal branch vessel irregularity.  IMPRESSION: Intracranial atherosclerotic type changes as detailed above.  This is most notable involving branch vessels.  Critical Value/emergent results were called by telephone at the time of interpretation on 08/17/2012 at 2:00 p.m. to Dr. Oletta Lamas, who verbally acknowledged these results.   Original Report Authenticated By: Fuller Canada, M.D.     EKG: A fib RVR.   Assessment/Plan:  1- CVA (cerebral vascular accident)-Acute right small nonhemorrhagic infarct seen on MRI:  Admit to step down unit due to A fib RVR. Speech therapy, PT, OT consult. Will order coumadin per pharmacy. I will defer neurology attending to start heparin. His recurrent stroke is likely related to A fib, I will defer to neuro ordering carotid doppler and ECHO. I will order lipid pane, and HB-A1c.    2-Atrial fibrillation with RVR:  I will continue with Cardizem at 5 mg, if BP decrease, will need to consider amiodarone or cardiology consult. Would try to avoid hypotension in the setting acute stroke. Holder parameter for SBP less than 100. Patient BP at this time in the 120 range.   3-HYPERLIPIDEMIA: fasting lipid panel.   ANEMIA-NOS  HYPERTENSION  COPD  Post-splenectomy  Long term current use of anticoagulant    Time spend: more than 60 minutes. Code Status: Full Code.  Family Communication: Wife at bedside, at this time patient will be full code, patient and wife want to talk about it.    Hartley Barefoot, MD  [facility] Pager [phone]  If 7PM-7AM, please contact night-coverage [email or web address] Password [facility] 08/17/2012, 5:07 PM

## 2012-08-17 NOTE — Progress Notes (Signed)
Pt refused chest x-ray

## 2012-08-17 NOTE — ED Notes (Signed)
Pt sts woke up from sleep last night with slurred speech and urinated on his self which is not per norm; pt denies SOB or CP at present; per wife pt with some dizziness; LSN normal last night before bed

## 2012-08-17 NOTE — ED Provider Notes (Signed)
History     CSN: [id number]  Arrival date & time 08/17/12  4132   First MD Initiated Contact with Patient 08/17/12 1007      Chief Complaint  Patient presents with  . Aphasia    (Consider location/radiation/quality/duration/timing/severity/associated sxs/prior treatment) HPI Comments: Patient with history of atrial fibrillation on Coumadin and diltiazem with possible history of remote stroke based on the patient and spouse is description had gone to bed as usual last night and had gotten up around midnight or 1 AM to try to go the bathroom. He seems slower than usual apparently did not make it all the way to the toilet. While on the toilet he probably urinated on himself on the floor which was unusual. The patient also was having garbled speech with possibly some slurring and was talking very quietly and the spouse could not understand his speech. By this morning, his speech is improved but not completely back to baseline. He denies a headache. The patient reports that he feels like he is thinking pretty clearly. Patient normally is ambulatory, will go and play golf and is still quite active. By old records, he had a brain MRI earlier this spring which showed a small remote infarct. Patient's spouse did take his blood pressure and heart rate earlier this morning and reports it was around 140/100 with a heart rate of 1:30. Patient denies any numbness or weakness to his arms or legs. No loss of balance occurred last night. Pt also with h/o bladder cancer, ITP s/p splenectomy.    The history is provided by the patient and the spouse.    Past Medical History  Diagnosis Date  . NEOPLASM, MALIGNANT, BLADDER 04/10/2009  . HYPERLIPIDEMIA 04/10/2009  . ANEMIA-NOS 04/10/2009  . 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 ANXIETY 04/10/2009  . HYPERTENSION 04/10/2009  . Atrial fibrillation 04/10/2009  . COPD 04/10/2009  . TRANSIENT ISCHEMIC ATTACK, HX OF 04/10/2009  . Bladder cancer     Dr. Darvin Neighbours    . Lacunar stroke 04/07/2011  . Pleural effusion 04/07/2011  . 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 Splenectomy   . Bilateral vats ablation   . Facial cancer     facial skin cancer    Family History  Problem Relation Age of Onset  . Heart disease Mother   . Arthritis Father     History  Substance Use Topics  . Smoking status: Never Smoker   . Smokeless tobacco: Not on file  . Alcohol Use: No      Review of Systems  Respiratory: Negative for shortness of breath.   Cardiovascular: Negative for chest pain.  Gastrointestinal: Negative for nausea and vomiting.  Genitourinary: Positive for urgency. Negative for frequency and flank pain.  Musculoskeletal: Negative for back pain.  Neurological: Positive for facial asymmetry and weakness. Negative for dizziness, numbness and headaches.  All other systems reviewed and are negative.    Allergies  Diazepam; Ezetimibe-simvastatin; Morphine; and Sulfonamide derivatives  Home Medications   Current Outpatient Rx  Name Route Sig Dispense Refill  . VITAMIN D 2000 UNITS PO TABS Oral Take 2,000-3,000 Units by mouth daily as needed. Patient takes 2000 units daily, except for taking 3000 units (1.5 tablets) on "winter days" and days when "not feeling well."    . DILTIAZEM HCL ER 120 MG PO CP24 Oral Take 120 mg by mouth daily.    . WARFARIN SODIUM 5 MG PO TABS Oral Take 2.5-5 mg by mouth  daily. Take 5mg  every day, except take 2.5mg  on Mondays.      BP 109/72  Pulse 138  Temp 98 F (36.7 C) (Oral)  Resp 16  SpO2 99%  Physical Exam  Nursing note and vitals reviewed. Constitutional: He is oriented to person, place, and time. He appears well-developed and well-nourished. No distress.  HENT:  Head: Normocephalic and atraumatic.  Eyes: Pupils are equal, round, and reactive to light. No scleral icterus.  Neck: Normal range of motion. Neck supple.  Pulmonary/Chest: Effort normal. No respiratory  distress. He has no wheezes.  Abdominal: Soft. He exhibits no distension. There is no tenderness.  Musculoskeletal: He exhibits no edema.  Neurological: He is alert and oriented to person, place, and time. He has normal strength.  Reflex Scores:      Patellar reflexes are 1+ on the right side and 1+ on the left side.      LUE slower on finger to nose than on right, gait is minimally ataxic, but able to ambulate well without assistance  Skin: Skin is warm and dry.    ED Course  Procedures (including critical care time)   CRITICAL CARE Performed by: Lear Ng.   Total critical care time: 30 min  Critical care time was exclusive of separately billable procedures and treating other patients.  Critical care was necessary to treat or prevent imminent or life-threatening deterioration.  Critical care was time spent personally by me on the following activities: development of treatment plan with patient and/or surrogate as well as nursing, discussions with consultants, evaluation of patient's response to treatment, examination of patient, obtaining history from patient or surrogate, ordering and performing treatments and interventions, ordering and review of laboratory studies, ordering and review of radiographic studies, pulse oximetry and re-evaluation of patient's condition.    Labs Reviewed  CBC WITH DIFFERENTIAL  BASIC METABOLIC PANEL  PROTIME-INR  APTT   No results found.   No diagnosis found.  RA sat is 99% and I interpret to be normal.  12:03 PM Head CT shows no acute abn, no hemorrhage, tumor, mass effect.  Will proceed with MRI of brain.  Will give diltiazem drip in the meantime for better rate control.     12:46 PM Pt's BUN is rather elevated with normal Cr, suggesting some dehydration which may be contributing to tachycardia and some of his symptoms.  Will give IVF bolus.       ECG at time 11:42 shows atrial fibrillation with RVR at rate 130, normal axis,  artifact noted, no overt ST or T wave abn's.  Similar in appearance to ECG from 03/30/11.       2:02 PM Discussed with radiologist who informed me that based on brain MRI if he has any acute right-sided stroke. My plan is to discuss with the neurologist as well as the hospitalist for admission.  MDM  Pt with symptoms concerning for mild CVA versus TIA that continues to resolve.  Pt last seen normal at bedtime yesterday.  Pt's speech is still slightly irregular with perception of slight facial droop per spouse.  I am unable to appreciate a signifcant abn.  Will get head CT and then MRI if unrevealing for a new stroke.  Basic labs, ECG, and PT also pending.  Likely admission.          Gavin Pound. Damarian Priola, MD 08/17/12 1402

## 2012-08-17 NOTE — ED Notes (Signed)
Pt transported to MRI 

## 2012-08-17 NOTE — Progress Notes (Signed)
Pt refusing chest xray. MD, Regalado aware. [name] L

## 2012-08-18 ENCOUNTER — Inpatient Hospital Stay (HOSPITAL_COMMUNITY): Payer: Medicare Other

## 2012-08-18 DIAGNOSIS — Z7901 Long term (current) use of anticoagulants: Secondary | ICD-10-CM

## 2012-08-18 DIAGNOSIS — I10 Essential (primary) hypertension: Secondary | ICD-10-CM

## 2012-08-18 LAB — LIPID PANEL
Cholesterol: 165 mg/dL (ref 0–200)
HDL: 58 mg/dL (ref >39)
Total CHOL/HDL Ratio: 2.8 RATIO
VLDL: 11 mg/dL (ref 0–40)

## 2012-08-18 LAB — PROTIME-INR: Prothrombin Time: 19.9 seconds — ABNORMAL HIGH (ref 11.6–15.2)

## 2012-08-18 LAB — HEMOGLOBIN A1C: Mean Plasma Glucose: 126 mg/dL — ABNORMAL HIGH (ref <117)

## 2012-08-18 MED ORDER — RESOURCE THICKENUP CLEAR PO POWD
ORAL | Status: DC | PRN
  Filled 2012-08-18: qty 125

## 2012-08-18 MED ORDER — WARFARIN SODIUM 7.5 MG PO TABS
7.5000 mg | ORAL_TABLET | Freq: Once | ORAL | Status: AC
  Administered 2012-08-18: 7.5 mg via ORAL
  Filled 2012-08-18: qty 1

## 2012-08-18 MED ORDER — ENOXAPARIN SODIUM 40 MG/0.4ML ~~LOC~~ SOLN
40.0000 mg | SUBCUTANEOUS | Status: DC
  Administered 2012-08-18: 40 mg via SUBCUTANEOUS
  Filled 2012-08-18 (×2): qty 0.4

## 2012-08-18 NOTE — Evaluation (Signed)
Physical Therapy Evaluation Patient Details Name: [name] MRN: [id number] DOB: 1927-02-09 Today's Date: 08/18/2012 Time: 4098-1191 PT Time Calculation (min): 41 min  PT Assessment / Plan / Recommendation Clinical Impression  Pt admitted with slurred speech. MRI show: Small non hemorrhagic acute infarct extends from the posterior superior right lenticular nucleus into the posterior right corona radiata. Pt with decreased balance, functional mobility and safety. Pt will benefit from skilled PT in the acute care setting in order to maximize functional mobility and safety prior to d/c with wife. Pt would make an excellent rehab candidate for extended therapy.    PT Assessment  Patient needs continued PT services    Follow Up Recommendations  Inpatient Rehab;Supervision for mobility/OOB    Barriers to Discharge        Equipment Recommendations  Defer to next venue    Recommendations for Other Services Rehab consult   Frequency Min 4X/week    Precautions / Restrictions Precautions Precautions: Fall Restrictions Weight Bearing Restrictions: No         Mobility  Bed Mobility Bed Mobility: Sit to Supine;Supine to Sit;Sitting - Scoot to Edge of Bed Supine to Sit: 3: Mod assist;HOB flat Sitting - Scoot to Edge of Bed: 3: Mod assist Sit to Supine: 3: Mod assist;HOB flat Details for Bed Mobility Assistance: assist to support trunk in/out of bed.  Pt required verbal cueing to sequence task. Transfers Transfers: Sit to Stand;Stand to Sit Sit to Stand: 3: Mod assist;With upper extremity assist;From bed;From toilet Stand to Sit: 4: Min assist;To bed;To toilet;With armrests;With upper extremity assist Details for Transfer Assistance: Assist to maintain upright posture and for steadying due to initial posterior lean. Ambulation/Gait Ambulation/Gait Assistance: 1: +2 Total assist Ambulation/Gait: Patient Percentage: 70% Ambulation Distance (Feet): 25 Feet Assistive device: 2 person  hand held assist Ambulation/Gait Assistance Details: Pt with slow shuffling gait although with increased ambulation(second trial), pt with bigger steps. Assist for stability as pt slightly unsteady during gait with narrow base of support. Pt with posterior lean as well in standing, cues for posterior pelvic tilt.  Gait Pattern: Step-to pattern;Shuffle;Narrow base of support;Trunk flexed;Decreased trunk rotation Gait velocity: decreased gait speed Modified Rankin (Stroke Patients Only) Pre-Morbid Rankin Score: No symptoms Modified Rankin: Moderately severe disability    Exercises     PT Diagnosis: Difficulty walking;Abnormality of gait  PT Problem List: Decreased strength;Decreased activity tolerance;Decreased balance;Decreased mobility;Decreased knowledge of use of DME;Decreased safety awareness;Decreased knowledge of precautions PT Treatment Interventions: DME instruction;Gait training;Stair training;Functional mobility training;Therapeutic activities;Balance training;Neuromuscular re-education;Patient/family education   PT Goals Acute Rehab PT Goals PT Goal Formulation: With patient/family Time For Goal Achievement: 08/25/12 Potential to Achieve Goals: Fair Pt will go Supine/Side to Sit: with modified independence PT Goal: Supine/Side to Sit - Progress: Goal set today Pt will go Sit to Supine/Side: with modified independence PT Goal: Sit to Supine/Side - Progress: Goal set today Pt will go Sit to Stand: with supervision PT Goal: Sit to Stand - Progress: Goal set today Pt will go Stand to Sit: with supervision PT Goal: Stand to Sit - Progress: Goal set today Pt will Transfer Bed to Chair/Chair to Bed: with supervision PT Transfer Goal: Bed to Chair/Chair to Bed - Progress: Goal set today Pt will Ambulate: >150 feet;with supervision;with least restrictive assistive device PT Goal: Ambulate - Progress: Goal set today Pt will Go Up / Down Stairs: 3-5 stairs;with supervision;with  rail(s) PT Goal: Up/Down Stairs - Progress: Goal set today  Visit Information  Last PT Received On:  08/18/12 Assistance Needed: +2    Subjective Data  Patient Stated Goal: to go home s   Prior Functioning  Home Living Lives With: Spouse Available Help at Discharge: Family;Available 24 hours/day Type of Home: House Home Access: Stairs to enter [employer] of Steps: 4 Entrance Stairs-Rails: None Home Layout: One level Bathroom Shower/Tub: Engineer, manufacturing systems: Standard Bathroom Accessibility: Yes How Accessible: Accessible via walker Home Adaptive Equipment: None Prior Function Level of Independence: Independent Able to Take Stairs?: Yes Driving: Yes Vocation: Retired Musician: No difficulties Dominant Hand: Right    Cognition  Overall Cognitive Status: Impaired Area of Impairment: Other (comment);Problem solving (increased time for processing information) Arousal/Alertness: Awake/alert Orientation Level: Appears intact for tasks assessed Behavior During Session: [facility] for tasks performed Problem Solving: Required significant verbal cueing to sequence bed mobility tasks. Cognition - Other Comments: Pt required significantly increased time for processing questions/commands, often requiring therapist to repeat or reword question    Extremity/Trunk Assessment Right Upper Extremity Assessment RUE ROM/Strength/Tone: Within functional levels Left Upper Extremity Assessment LUE ROM/Strength/Tone: Within functional levels Right Lower Extremity Assessment RLE ROM/Strength/Tone: Within functional levels RLE Sensation: WFL - Light Touch Left Lower Extremity Assessment LLE ROM/Strength/Tone: Within functional levels LLE Sensation: WFL - Light Touch   Balance Balance Balance Assessed: Yes Static Sitting Balance Static Sitting - Balance Support: No upper extremity supported;Feet supported Static Sitting - Level of Assistance: 5: Stand by  assistance Static Sitting - Comment/# of Minutes: ~ 5 min Dynamic Sitting Balance Dynamic Sitting - Balance Support: During functional activity;No upper extremity supported;Feet supported Dynamic Sitting - Level of Assistance: 5: Stand by assistance Dynamic Sitting Balance - Compensations: donned socks sitting EOB Static Standing Balance Static Standing - Balance Support: Bilateral upper extremity supported Static Standing - Level of Assistance: 3: Mod assist Static Standing - Comment/# of Minutes: 2 min  End of Session PT - End of Session Equipment Utilized During Treatment: Gait belt Activity Tolerance: Patient tolerated treatment well Patient left: in bed;with call bell/phone within reach;with family/visitor present Nurse Communication: Mobility status  GP     Milana Kidney 08/18/2012, 6:34 PM  08/18/2012 Milana Kidney DPT PAGER: [phone] OFFICE: [phone]

## 2012-08-18 NOTE — Progress Notes (Signed)
Stroke Team Progress Note  HISTORY [name] is an 78 y.o. male who developed slurred speech during breakfast with his wife around 7am 08/17/2012. He went to bed feeling "normal", then woke up to urinate "just after midnight". He says that he awoke fine, but developed above problem. Denies any weakness in extremities or with thought processes. Wife is present.  He does state that he has been in afib chronically and is on coumadin managed by the [facility] in [facility]. He sees Arnette Felts, [place] there. The wife states that he had an echo last month, but he does not know results. In the ER he was not rate controlled and was placed on Cardizem.  Wife states that he has some bleeding around his penis. She thinks it is with urination. She notes blood on his underwear.  Patient was not a TPA candidate secondary to unknown time of symptom onset. He was admitted to the step down unit for further evaluation and treatment.  SUBJECTIVE His wife is at the bedside.  Overall he feels his condition is unchanged. ST at bedside recommends MBSS. Patient with recurrent bladder cancer. Dr. Pearlean Brownie discussed alternative anticoagulants with wife; he defers decision to OP cardiologist.  OBJECTIVE Most recent Vital Signs: Filed Vitals:   08/17/12 1939 08/17/12 1956 08/18/12 0000 08/18/12 0354  BP:  131/76 106/65 118/91  Pulse:  112 95 102  Temp: 98.1 F (36.7 C) 98.1 F (36.7 C) 98 F (36.7 C) 97.8 F (36.6 C)  TempSrc: Axillary Axillary Oral Oral  Resp:  18 16 16   Height:      Weight:      SpO2:  99% 96% 96%   Intake/Output from previous day: 08/22 0701 - 08/23 0700 In: 205 [I.V.:205] Out: 950 [Urine:950]  IV Fluid Intake:     . diltiazem (CARDIZEM) infusion 5 mg/hr (08/18/12 0606)    MEDICATIONS    . diltiazem (CARDIZEM) infusion  5-15 mg/hr Intravenous Once  . diltiazem  10 mg Intravenous Once  . docusate sodium  100 mg Oral BID  . sodium chloride  500 mL Intravenous Once  . sodium chloride   3 mL Intravenous Q12H  . warfarin  7.5 mg Oral ONCE-1800  . warfarin  7.5 mg Oral ONCE-1800  . Warfarin - Pharmacist Dosing Inpatient   Does not apply q1800  . DISCONTD: diltiazem  15 mg Intravenous Once   PRN:  sodium chloride, senna-docusate, sodium chloride  Diet:  NPO  Activity:  Bedrest DVT Prophylaxis:  warfarin  CLINICALLY SIGNIFICANT STUDIES Basic Metabolic Panel:  Lab 08/17/12 1610  NA 139  K 4.7  CL 100  CO2 32  GLUCOSE 84  BUN 32*  CREATININE 1.08  CALCIUM 9.6  MG --  PHOS --   CBC:  Lab 08/17/12 1121  WBC 5.6  NEUTROABS 4.0  HGB 13.0  HCT 38.5*  MCV 99.7  PLT 149*   Coagulation:  Lab 08/18/12 0350 08/17/12 1121  LABPROT 19.9* 20.3*  INR 1.66* 1.70*   Urinalysis:  Lab 08/17/12 1630  COLORURINE YELLOW  LABSPEC 1.011  PHURINE 7.0  GLUCOSEU NEGATIVE  HGBUR TRACE*  BILIRUBINUR NEGATIVE  KETONESUR NEGATIVE  PROTEINUR NEGATIVE  UROBILINOGEN 0.2  NITRITE NEGATIVE  LEUKOCYTESUR TRACE*   Lipid Panel    Component Value Date/Time   CHOL 165 08/18/2012 0350   TRIG 57 08/18/2012 0350   HDL 58 08/18/2012 0350   CHOLHDL 2.8 08/18/2012 0350   VLDL 11 08/18/2012 0350   LDLCALC 96 08/18/2012 0350  HgbA1C  No results found for this basename: HGBA1C   Urine Drug Screen:   No results found for this basename: labopia, cocainscrnur, labbenz, amphetmu, thcu, labbarb    Alcohol Level: No results found for this basename: ETH:2 in the last 168 hours  CT of the brain  08/17/2012   1. No acute intracranial abnormality. 2.  Chronic small vessel ischemia with progression since 2012.     MRI of the brain  08/17/2012 Small non hemorrhagic acute infarct extends from the posterior superior right lenticular nucleus into the posterior right corona radiata. Remote infarcts and small vessel disease type changes  MRA of the brain  08/17/2012  Intracranial atherosclerotic type changes.  This is most notable involving branch vessels.    2D Echocardiogram    Carotid Doppler      EKG  atrial fibrillation, rate 130.   Therapy Recommendations PT - ; OT - ; ST - MBSS  Physical Exam   Frail cachectic elderly caucasian male not in distress.Awake alert. Afebrile. Head is nontraumatic. Neck is supple without bruit. Hearing is diminished bilaterally.l. Cardiac exam no murmur or gallop. Lungs are clear to auscultation. Distal pulses are well felt.  Neurological Exam ; Awake alert oriented x 3 normal speech and language.fundi not visualized. Visual fields appear normal. Vision is adequate. Mild left lower face asymmetry. Tongue midline. No drift. Mild diminished fine finger movements on left. Orbits right over left upper extremity. Mild left grip weak.. Normal sensation . Normal coordination. Gait not tested ASSESSMENT [name] is a 78 y.o. male presenting with slurred speech. Imaging confirms a small non hemorrhagic acute infarct extends from the posterior superior right lenticular nucleus into the posterior right corona radiata.  Infarct felt to be embolic secondary to atrial fibrillation.  On warfarin prior to admission. INR 1.70 on arrival. Now on warfarin for secondary stroke prevention. IV heparin bridging recommended by neuro consult yesterday. Patient with resultant slurred speech, aphasia, no notable weakness.  -recurrent bladder cancer -hyperlipidemia, LDL 96 -hypertension -atrial fibrillation    Hospital day # 1  TREATMENT/PLAN -Continue warfarin for secondary stroke prevention. No indication for IV heparin bridging. Do recommend lovenox for VTE prophylaxis until INR therapeutic.I did discuss alternatives to warfarin with patient and wife but they appear reluctant to change. -OOB -MBSS -PT OT consults -carotid Doppler not indicated as patient not a surgical candidate -2-D echocardiogram not indicated as patient already on warfarin   Dr. Pearlean Brownie discussed with Dr. Lemmie Evens, MSN, RN, ANVP-BC, ANP-BC, GNP-BC [facility] Pager:  [phone] 08/18/2012 8:33 AM  Scribe for Dr. Delia Heady, Stroke Center Medical Director, who has personally reviewed chart, pertinent data, examined the patient and developed the plan of care. Pager:  [phone]

## 2012-08-18 NOTE — Evaluation (Signed)
Speech Language Pathology Evaluation Patient Details Name: [name] MRN: [id number] DOB: 1927-09-07 Today's Date: 08/18/2012 Time: 1478-2956 SLP Time Calculation (min): 45 min  Problem List:  Patient Active Problem List  Diagnosis  . NEOPLASM, MALIGNANT, BLADDER  . HYPERLIPIDEMIA  . ANEMIA-NOS  . Immune thrombocytopenic purpura  . ANXIETY  . HYPERTENSION  . Atrial fibrillation with RVR  . COPD  . TRANSIENT ISCHEMIC ATTACK, HX OF  . Lacunar stroke  . Pleural effusion  . Post-splenectomy  . Long term current use of anticoagulant  . CVA (cerebral vascular accident)-Acute right small nonhemorrhagic infarct seen on MRI   Past Medical History:  Past Medical History  Diagnosis Date  . NEOPLASM, MALIGNANT, BLADDER 04/10/2009  . HYPERLIPIDEMIA 04/10/2009  . ANEMIA-NOS 04/10/2009  . 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 ANXIETY 04/10/2009  . HYPERTENSION 04/10/2009  . Atrial fibrillation 04/10/2009  . COPD 04/10/2009  . TRANSIENT ISCHEMIC ATTACK, HX OF 04/10/2009  . Bladder cancer     Dr. Darvin Neighbours  . Lacunar stroke 04/07/2011  . Pleural effusion 04/07/2011  . 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 Splenectomy   . Bilateral vats ablation   . Facial cancer     facial skin cancer   HPI:  78 year old with PMH significant for A fib on chronic coumadin, prior history of stroke, ho bladder cancer, who presents to ED accompanied by wife due to slurred speech, drooling and episode of incontinence. MRI shows small right CVA. Pt failed RN stroke swallow screen due to coughing while drinking water.    Assessment / Plan / Recommendation Clinical Impression  Pt presents with mild dysarthria and mild to moderte cognitive deficits resulting from right CVA. Pt with poor emergent and intellectual awareness of deficits impariing safety with basic functional tasks. Problem solving and reasoning deficits require Mod assist for basic  functional tasks. Wife acknowledges some baseline cognitive deficits requiring assist prior to admit but suspect increased confusion and decreased awareness since this new CVA. Pt will benefit from SLP f/u in acute setting for improved safety awareness and independence with ADLs for d/c to next level of care. Pt would beneit from CIR from SLP standpoint but physical defcits are unclear. Await PT/OT assessment.     SLP Assessment  Patient needs continued Speech Lanaguage Pathology Services    Follow Up Recommendations  Other (comment);Inpatient Rehab (TBD after MBS)    Frequency and Duration min 2x/week  2 weeks   Pertinent Vitals/Pain NA   SLP Goals  SLP Goals Potential to Achieve Goals: Good Progress/Goals/Alternative treatment plan discussed with pt/caregiver and they: Patient unable to parrticipate in goal setting SLP Goal #1: Pt will complete basic functional problem solving task with min contextual cues  SLP Goal #2: Pt will demonstrate emergent awareness of deficits with min contextual cues during functional ADL.  SLP Goal #3: Pt will utilize overarticulated, loud slow speech to increase intelligibility at conversation level for 1 minute with moderate verbal cues.   SLP Evaluation Prior Functioning  Cognitive/Linguistic Baseline: Baseline deficits Baseline deficit details: short term memory per pt and wife. Possible problem solving deficits. Wife provides assist.  Lives With: Spouse Available Help at Discharge: Family Vocation: Retired   IT consultant  Overall Cognitive Status: Impaired Arousal/Alertness: Awake/alert Orientation Level: Oriented X4 Attention: Focused;Sustained;Selective Focused Attention: Appears intact Sustained Attention: Appears intact Selective Attention: Impaired Selective Attention Impairment: Verbal complex;Functional complex Memory: Impaired Memory Impairment: Decreased short term memory Decreased Short  Term Memory: Verbal complex Awareness:  Impaired Awareness Impairment: Intellectual impairment;Emergent impairment;Anticipatory impairment Problem Solving: Impaired Problem Solving Impairment: Functional basic;Verbal basic Executive Function: Reasoning;Sequencing;Self Monitoring;Self Correcting Reasoning: Impaired Reasoning Impairment: Verbal complex;Functional basic Sequencing: Impaired Sequencing Impairment: Verbal complex Self Monitoring: Impaired Self Monitoring Impairment: Verbal complex;Functional basic Self Correcting: Impaired Self Correcting Impairment: Verbal complex;Functional basic Safety/Judgment: Impaired    Comprehension  Auditory Comprehension Overall Auditory Comprehension: Impaired Yes/No Questions: Within Functional Limits Commands: Impaired Multistep Basic Commands: 50-74% accurate Complex Commands: 50-74% accurate Conversation: Complex    Expression Verbal Expression Overall Verbal Expression: Appears within functional limits for tasks assessed   Oral / Motor Oral Motor/Sensory Function Overall Oral Motor/Sensory Function: Impaired Labial ROM: Reduced left Labial Symmetry: Abnormal symmetry left Labial Strength: Reduced Lingual ROM: Reduced left Lingual Symmetry: Abnormal symmetry left Lingual Strength: Reduced Facial Symmetry: Left droop   GO     [name], [name] 08/18/2012, 9:37 AM

## 2012-08-18 NOTE — Progress Notes (Signed)
Occupational Therapy Evaluation Patient Details Name: [name] MRN: [id number] DOB: Jul 17, 1927 Today's Date: 08/18/2012 Time: 4098-1191 OT Time Calculation (min): 43 min  OT Assessment / Plan / Recommendation Clinical Impression  Pt admitted with slurred speech. MRI show: Small non hemorrhagic acute infarct extends from the posterior superior right lenticular nucleus into the posterior right corona radiata. Will benefit from acute OT services to address below problem list in prep for d/c. Recommending CIR to further maximize independence and safety with ADLs before d/c home.    OT Assessment  Patient needs continued OT Services    Follow Up Recommendations  Inpatient Rehab    Barriers to Discharge      Equipment Recommendations  Defer to next venue    Recommendations for Other Services Rehab consult  Frequency  Min 3X/week    Precautions / Restrictions Precautions Precautions: Fall   Pertinent Vitals/Pain See vitals    ADL  Lower Body Dressing: Performed;Supervision/safety Where Assessed - Lower Body Dressing: Unsupported sitting Toilet Transfer: Performed;+2 Total assistance Toilet Transfer: Patient Percentage: 70% Toilet Transfer Method: Sit to stand Toilet Transfer Equipment: Comfort height toilet;Grab bars Toileting - Clothing Manipulation and Hygiene: Performed;Moderate assistance Where Assessed - Toileting Clothing Manipulation and Hygiene: Sit to stand from 3-in-1 or toilet Equipment Used: Gait belt Transfers/Ambulation Related to ADLs: +2 total pt 70% for ambulation to bathroom. Assist for steadying and balance.  Pt initially with slow shuffled steps but began taking larger steps toward end. ADL Comments: Pt donned bil socks sitting EOB.  Pt requesting to walk to bathroom to attempt having BM on commode.      OT Diagnosis: Generalized weakness;Cognitive deficits  OT Problem List: Decreased strength;Decreased activity tolerance;Impaired balance (sitting and/or  standing);Decreased cognition;Decreased knowledge of use of DME or AE OT Treatment Interventions: Self-care/ADL training;DME and/or AE instruction;Neuromuscular education;Therapeutic activities;Cognitive remediation/compensation;Patient/family education;Balance training   OT Goals Acute Rehab OT Goals OT Goal Formulation: With patient/family Time For Goal Achievement: 09/01/12 Potential to Achieve Goals: Good ADL Goals Pt Will Perform Grooming: with supervision;Standing at sink (3 tasks) ADL Goal: Grooming - Progress: Goal set today Pt Will Transfer to Toilet: with supervision;Ambulation;Comfort height toilet ADL Goal: Toilet Transfer - Progress: Goal set today Pt Will Perform Toileting - Clothing Manipulation: with supervision;Standing;Sitting on 3-in-1 or toilet ADL Goal: Toileting - Clothing Manipulation - Progress: Goal set today Pt Will Perform Toileting - Hygiene: with supervision;Standing at 3-in-1/toilet ADL Goal: Toileting - Hygiene - Progress: Goal set today Miscellaneous OT Goals Miscellaneous OT Goal #1: Pt will perform dynamic standing balance task >5 min with supervision in prep for ADLs. OT Goal: Miscellaneous Goal #1 - Progress: Goal set today  Visit Information  Last OT Received On: 08/18/12 Assistance Needed: +2 PT/OT Co-Evaluation/Treatment: Yes    Subjective Data      Prior Functioning  Vision/Perception         Cognition  Overall Cognitive Status: Impaired Area of Impairment: Other (comment);Problem solving (increased time for processing information) Arousal/Alertness: Awake/alert Orientation Level: Appears intact for tasks assessed Behavior During Session: [facility] for tasks performed Problem Solving: Required significant verbal cueing to sequence bed mobility tasks. Cognition - Other Comments: Pt required significantly increased time for processing questions/commands, often requiring therapist to repeat or reword question    Extremity/Trunk Assessment  Right Upper Extremity Assessment RUE ROM/Strength/Tone: Within functional levels Left Upper Extremity Assessment LUE ROM/Strength/Tone: Within functional levels   Mobility Bed Mobility Bed Mobility: Sit to Supine;Supine to Sit;Sitting - Scoot to Edge of Bed Supine to Sit:  3: Mod assist;HOB flat Sitting - Scoot to Edge of Bed: 3: Mod assist Sit to Supine: 3: Mod assist;HOB flat Details for Bed Mobility Assistance: assist to support trunk in/out of bed.  Pt required verbal cueing to sequence task. Transfers Transfers: Sit to Stand;Stand to Sit Sit to Stand: 3: Mod assist;With upper extremity assist;From bed;From toilet Stand to Sit: 4: Min assist;To bed;To toilet;With armrests;With upper extremity assist Details for Transfer Assistance: Assist to maintain upright posture and for steadying due to initial posterior lean.   Exercise    Balance Balance Balance Assessed: Yes Static Sitting Balance Static Sitting - Balance Support: No upper extremity supported;Feet supported Static Sitting - Level of Assistance: 5: Stand by assistance Static Sitting - Comment/# of Minutes: ~ 5 min Dynamic Sitting Balance Dynamic Sitting - Balance Support: During functional activity;No upper extremity supported;Feet supported Dynamic Sitting - Level of Assistance: 5: Stand by assistance Dynamic Sitting Balance - Compensations: donned socks sitting EOB Static Standing Balance Static Standing - Balance Support: Bilateral upper extremity supported Static Standing - Level of Assistance: 3: Mod assist Static Standing - Comment/# of Minutes: 2 min  End of Session OT - End of Session Equipment Utilized During Treatment: Gait belt Activity Tolerance: Patient tolerated treatment well Patient left: in bed;with call bell/phone within reach;with family/visitor present;with bed alarm set Nurse Communication: Mobility status;Other (comment) (condom cath about to fall off)  GO    08/18/2012 Cipriano Mile  OTR/L Pager [phone] Office [phone]  Cipriano Mile 08/18/2012, 6:00 PM

## 2012-08-18 NOTE — Progress Notes (Signed)
Utilization Review Completed.  Didier Brandenburg T  08/18/2012  

## 2012-08-18 NOTE — Evaluation (Signed)
Clinical/Bedside Swallow Evaluation Patient Details  Name: [name] MRN: [id number] Date of Birth: Jul 27, 1927  Today's Date: 08/18/2012 Time: 5284-1324 SLP Time Calculation (min): 15 min  Past Medical History:  Past Medical History  Diagnosis Date  . NEOPLASM, MALIGNANT, BLADDER 04/10/2009  . HYPERLIPIDEMIA 04/10/2009  . ANEMIA-NOS 04/10/2009  . 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 ANXIETY 04/10/2009  . HYPERTENSION 04/10/2009  . Atrial fibrillation 04/10/2009  . COPD 04/10/2009  . TRANSIENT ISCHEMIC ATTACK, HX OF 04/10/2009  . Bladder cancer     Dr. Darvin Neighbours  . Lacunar stroke 04/07/2011  . Pleural effusion 04/07/2011  . 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 Splenectomy   . Bilateral vats ablation   . Facial cancer     facial skin cancer   HPI:  78 year old with PMH significant for A fib on chronic coumadin, prior history of stroke, ho bladder cancer, who presents to ED accompanied by wife due to slurred speech, drooling and episode of incontinence. MRI shows small right CVA. Pt failed RN stroke swallow screen due to coughing while drinking water.    Assessment / Plan / Recommendation Clinical Impression  Pt presents with a mild oral dyspahgia with left lingual/labial weakness. Overt signs of aspiration present with thin liquids. MBS warranted to objectively evaluate swallow functiona nd determine if diet modification of compensatory strategies are necessary. WIll comlete today at 10 am. Pt may take meds whole in puree until that time.     Aspiration Risk  Moderate    Diet Recommendation NPO except meds   Medication Administration: Whole meds with puree    Other  Recommendations Recommended Consults: MBS   Follow Up Recommendations  Other (comment) (TBD after MBS)    Frequency and Duration        Pertinent Vitals/Pain NA    SLP Swallow Goals     Swallow Study Prior Functional Status         General HPI: 78 year old with PMH significant for A fib on chronic coumadin, prior history of stroke, ho bladder cancer, who presents to ED accompanied by wife due to slurred speech, drooling and episode of incontinence. MRI shows small right CVA. Pt failed RN stroke swallow screen due to coughing while drinking water.  Type of Study: Bedside swallow evaluation Diet Prior to this Study: NPO Behavior/Cognition: Alert;Cooperative;Pleasant mood Oral Cavity - Dentition: Adequate natural dentition Self-Feeding Abilities: Able to feed self Patient Positioning: Upright in bed Baseline Vocal Quality: Clear;Low vocal intensity Volitional Cough: Strong Volitional Swallow: Able to elicit    Oral/Motor/Sensory Function Overall Oral Motor/Sensory Function: Impaired Labial ROM: Reduced left Labial Symmetry: Abnormal symmetry left Labial Strength: Reduced Lingual ROM: Reduced left Lingual Symmetry: Abnormal symmetry left Lingual Strength: Reduced Facial Symmetry: Left droop   Ice Chips Ice chips: Not tested   Thin Liquid Thin Liquid: Impaired Presentation: Cup;Self Fed Oral Phase Functional Implications: Prolonged oral transit Pharyngeal  Phase Impairments: Suspected delayed Swallow;Cough - Immediate;Throat Clearing - Immediate    Nectar Thick Nectar Thick Liquid: Not tested   Honey Thick Honey Thick Liquid: Not tested   Puree Puree: Impaired Presentation: Self Fed;Spoon Oral Phase Impairments: Reduced lingual movement/coordination Oral Phase Functional Implications: Left anterior spillage;Prolonged oral transit   Solid   GO    Solid: Impaired Presentation: Self Fed Oral Phase Impairments: Reduced lingual movement/coordination Oral Phase Functional Implications: Left lateral sulci pocketing;Oral residue       [name], [name]  08/18/2012,9:21 AM

## 2012-08-18 NOTE — Progress Notes (Signed)
INITIAL ADULT NUTRITION ASSESSMENT Date: 08/18/2012   Time: 10:10 AM  Reason for Assessment: Health History  ASSESSMENT: Male 78 y.o.  Dx: CVA  Hx:  Past Medical History  Diagnosis Date  . NEOPLASM, MALIGNANT, BLADDER 04/10/2009  . HYPERLIPIDEMIA 04/10/2009  . ANEMIA-NOS 04/10/2009  . 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 ANXIETY 04/10/2009  . HYPERTENSION 04/10/2009  . Atrial fibrillation 04/10/2009  . COPD 04/10/2009  . TRANSIENT ISCHEMIC ATTACK, HX OF 04/10/2009  . Bladder cancer     Dr. Darvin Neighbours  . Lacunar stroke 04/07/2011  . Pleural effusion 04/07/2011  . 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 Splenectomy   . Bilateral vats ablation   . Facial cancer     facial skin cancer   Related Meds:     . diltiazem (CARDIZEM) infusion  5-15 mg/hr Intravenous Once  . diltiazem  10 mg Intravenous Once  . docusate sodium  100 mg Oral BID  . sodium chloride  500 mL Intravenous Once  . sodium chloride  3 mL Intravenous Q12H  . warfarin  7.5 mg Oral ONCE-1800  . warfarin  7.5 mg Oral ONCE-1800  . Warfarin - Pharmacist Dosing Inpatient   Does not apply q1800  . DISCONTD: diltiazem  15 mg Intravenous Once   Ht: 5\' 8"  (172.7 cm)  Wt: 115 lb 11.9 oz (52.5 kg)  Ideal Wt: 70 kg % Ideal Wt: 75%  Wt Readings from Last 15 Encounters:  08/17/12 115 lb 11.9 oz (52.5 kg)  04/07/11 134 lb 4 oz (60.895 kg)  04/10/09 132 lb (59.875 kg)  Usual Wt: 127 - 130 lb % Usual Wt: 90%  Body mass index is 17.60 kg/(m^2). Pt is underweight.  Food/Nutrition Related Hx: great appetite PTA; consumes healthy diet  Labs:  CMP     Component Value Date/Time   NA 139 08/17/2012 1121   K 4.7 08/17/2012 1121   CL 100 08/17/2012 1121   CO2 32 08/17/2012 1121   GLUCOSE 84 08/17/2012 1121   BUN 32* 08/17/2012 1121   CREATININE 1.08 08/17/2012 1121   CALCIUM 9.6 08/17/2012 1121   PROT 5.5* 03/31/2009 2230   ALBUMIN 2.2* 03/31/2009 2230   AST 22 03/31/2009 2230   ALT 18  03/31/2009 2230   ALKPHOS 68 03/31/2009 2230   BILITOT 0.8 03/31/2009 2230   GFRNONAA 62* 08/17/2012 1121   GFRAA 72* 08/17/2012 1121    Intake/Output Summary (Last 24 hours) at 08/18/12 1012 Last data filed at 08/18/12 1000  Gross per 24 hour  Intake    265 ml  Output   1150 ml  Net   -885 ml   Lipid Panel     Component Value Date/Time   CHOL 165 08/18/2012 0350   TRIG 57 08/18/2012 0350   HDL 58 08/18/2012 0350   CHOLHDL 2.8 08/18/2012 0350   VLDL 11 08/18/2012 0350   LDLCALC 96 08/18/2012 0350    Intake/Output Summary (Last 24 hours) at 08/18/12 1013 Last data filed at 08/18/12 1000  Gross per 24 hour  Intake    265 ml  Output   1150 ml  Net   -885 ml   Diet Order: NPO  Supplements/Tube Feeding: none  IVF:    diltiazem (CARDIZEM) infusion Last Rate: 5 mg/hr (08/18/12 0606)    Estimated Nutritional Needs:   Kcal: 1400 - 1600 kcal Protein:  64 - 75 grams Fluid:  at least 1.5 liters daily  Pt admitted s/p CVA. RD drawn  to chart 2/2 underweight BMI. Nursing obtained weight via bedscale of 115 lb.  RD re-weighed patient in bed - pt weighed 118 lb. Wife reports that this weight is likely not accurate as pt weighed 127 - 130 lb just 2 days ago. Wife states that patient has a great appetite, eats a lot of fresh vegetables and lean meats. Pt avoids sweets. Wife notes that patient's pants fit normally and doesn't suspect any weight loss.  She does note that patient has bladder cancer and recently received treatment for his cancer. She states he has completed his course of treatment.  Wife states pt is fairly active - plays golf, does yard work, Catering manager.  Pt is currently awaiting formal MBSS to assess swallow evaluation. Pt remains NPO 2/2 overt signs of aspiration present with thin liquids.  Wife is interested in high-calorie and high-protein information for patient. RD provided appropriate handouts - wife appreciative of information.  Pt is at nutrition risk given recent catabolic  illness and possible recent weight loss.  NUTRITION DIAGNOSIS: -Swallowing difficulty (NI-1.1).  Status: Ongoing  RELATED TO: recent CVA  AS EVIDENCE BY: dysphagia and need for ST  MONITORING/EVALUATION(Goals): Goal: Advance to appropriate diet per SLP/MD. Pt to meet >/= 90% of their estimated nutrition needs. Monitor: weights, labs, PO intake, I/O's  EDUCATION NEEDS: -Education needs addressed  INTERVENTION: 1. Provided "High Calorie, High Protein Nutrition Therapy" and "Nutrition Therapy Before/After Cancer Treatment" handouts from [employer]. 2. Advance diet as medically appropriate per MD/SLP. 3. RD to continue to follow   DOCUMENTATION CODES Per approved criteria  -Underweight   Jarold Motto MS, RD, LDN Pager: [phone] After-hours pager: [phone]

## 2012-08-18 NOTE — Procedures (Signed)
Objective Swallowing Evaluation: Modified Barium Swallowing Study  Patient Details  Name: [name] MRN: [id number] Date of Birth: 07/03/1927  Today's Date: 08/18/2012 Time: 1030-1054 SLP Time Calculation (min): 24 min  Past Medical History:  Past Medical History  Diagnosis Date  . NEOPLASM, MALIGNANT, BLADDER 04/10/2009  . HYPERLIPIDEMIA 04/10/2009  . ANEMIA-NOS 04/10/2009  . 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 ANXIETY 04/10/2009  . HYPERTENSION 04/10/2009  . Atrial fibrillation 04/10/2009  . COPD 04/10/2009  . TRANSIENT ISCHEMIC ATTACK, HX OF 04/10/2009  . Bladder cancer     Dr. Darvin Neighbours  . Lacunar stroke 04/07/2011  . Pleural effusion 04/07/2011  . 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 Splenectomy   . Bilateral vats ablation   . Facial cancer     facial skin cancer   HPI:  78 year old with PMH significant for A fib on chronic coumadin, prior history of stroke, ho bladder cancer, who presents to ED accompanied by wife due to slurred speech, drooling and episode of incontinence. MRI shows small right CVA. Overt signs of aspiration present during bedside swallow eval. MBS to determine best diet/strategy.      Assessment / Plan / Recommendation Clinical Impression  Dysphagia Diagnosis: Mild oral phase dysphagia;Moderate pharyngeal phase dysphagia;Mild cervical esophageal phase dysphagia Clinical impression: [name] presents with a mild oral dyspahgia with mild oral residual due to left labial and lingual weakness. More signficant oropharyngeal dysphagia with sensory/motor deficits leading to a delay in swallow initiaton and silent aspriaiton of thin liquids before the swallow. Furthermore weakness of base of tongue and hyolaryngeal complex results in mild to moderate residual that necesitates verbal cues for a second swallow to clear. Pt is a moderate aspiration risk, reduced with nectar thick liquids and a  mechanical soft diet with a second swallow and intermittent throat clear. Pt will need full supervision to follow given mild cognitive deficits.     Treatment Recommendation  Therapy as outlined in treatment plan below    Diet Recommendation Dysphagia 3 (Mechanical Soft);Nectar-thick liquid   Liquid Administration via: Cup Medication Administration: Whole meds with puree Supervision: Patient able to self feed;Full supervision/cueing for compensatory strategies Compensations: Slow rate;Small sips/bites;Multiple dry swallows after each bite/sip;Follow solids with liquid;Clear throat intermittently Postural Changes and/or Swallow Maneuvers: Seated upright 90 degrees    Other  Recommendations Recommended Consults: MBS Oral Care Recommendations: Oral care BID Other Recommendations: Order thickener from pharmacy   Follow Up Recommendations  Inpatient Rehab (if physical defictis warrant CIR as well. )    Frequency and Duration min 2x/week  2 weeks   Pertinent Vitals/Pain NA    SLP Swallow Goals Patient will consume recommended diet without observed clinical signs of aspiration with: Modified independent assistance Patient will utilize recommended strategies during swallow to increase swallowing safety with: Modified independent assistance   General HPI: 78 year old with PMH significant for A fib on chronic coumadin, prior history of stroke, ho bladder cancer, who presents to ED accompanied by wife due to slurred speech, drooling and episode of incontinence. MRI shows small right CVA. Overt signs of aspiration present during bedside swallow eval. MBS to determine best diet/strategy.  Type of Study: Modified Barium Swallowing Study Reason for Referral: Objectively evaluate swallowing function Diet Prior to this Study: NPO Respiratory Status: Room air History of Recent Intubation: No Behavior/Cognition: Alert;Cooperative;Pleasant mood Oral Cavity - Dentition: Adequate natural  dentition Oral Motor / Sensory Function: Impaired - see  Bedside swallow eval Self-Feeding Abilities: Able to feed self Patient Positioning: Upright in chair Baseline Vocal Quality: Clear;Low vocal intensity Volitional Cough: Strong Volitional Swallow: Able to elicit Anatomy: Within functional limits Pharyngeal Secretions: Not observed secondary MBS    Reason for Referral Objectively evaluate swallowing function   Oral Phase     Pharyngeal Phase Pharyngeal Phase: Impaired   Cervical Esophageal Phase    GO    Cervical Esophageal Phase: Impaired   Harlon Ditty, MA CCC-SLP [phone]   Claudine Mouton 08/18/2012, 11:10 AM

## 2012-08-18 NOTE — Progress Notes (Signed)
ANTICOAGULATION CONSULT NOTE - Initial Consult  Pharmacy Consult for Coumadin Indication: atrial fibrillation and stroke  Vital Signs: Temp: 97.8 F (36.6 C) (08/23 0354) Temp src: Oral (08/23 0354) BP: 118/91 mmHg (08/23 0354) Pulse Rate: 102  (08/23 0354)  Labs:  Basename 08/18/12 0350 08/17/12 1121  HGB -- 13.0  HCT -- 38.5*  PLT -- 149*  APTT -- 31  LABPROT 19.9* 20.3*  INR 1.66* 1.70*  HEPARINUNFRC -- --  CREATININE -- 1.08  CKTOTAL -- --  CKMB -- --  TROPONINI -- --    Assessment: 78 y/o male patient admitted with acute non-hemorrhagic stroke, on chronic coumadin for h/o stroke/afib. INR remains subtherapeutic at 1.66. Plan to continue coumadin.   Goal of Therapy:  INR 2-3 Monitor platelets by anticoagulation protocol: Yes   Plan:  Coumadin 7.5mg  today and f/u daily protime.  Celedonio Miyamoto, PharmD, BCPS Clinical Pharmacist Pager [phone]  08/18/2012,8:08 AM

## 2012-08-18 NOTE — Progress Notes (Signed)
Subjective: Feeling well speech clear, no complaints. Was not able to est very well last night.  Objective: Filed Vitals:   08/17/12 1939 08/17/12 1956 08/18/12 0000 08/18/12 0354  BP:  131/76 106/65 118/91  Pulse:  112 95 102  Temp: 98.1 F (36.7 C) 98.1 F (36.7 C) 98 F (36.7 C) 97.8 F (36.6 C)  TempSrc: Axillary Axillary Oral Oral  Resp:  18 16 16   Height:      Weight:      SpO2:  99% 96% 96%   Weight change:    General: Alert, awake, oriented x3, in no acute distress.  HEENT: No bruits, no goiter.  Heart: Regular rate and rhythm, without murmurs, rubs, gallops.  Lungs: CTA, bilateral air movement.  Abdomen: Soft, nontender, nondistended, positive bowel sounds.  Neuro: Grossly intact, nonfocal. Speech clear. mild flattening of left naso-labial fold.   Extremities; no edema.    Lab Results:  [facility] 08/17/12 1121  NA 139  K 4.7  CL 100  CO2 32  GLUCOSE 84  BUN 32*  CREATININE 1.08  CALCIUM 9.6  MG --  PHOS --   Basename 08/17/12 1121  WBC 5.6  NEUTROABS 4.0  HGB 13.0  HCT 38.5*  MCV 99.7  PLT 149*    Basename 08/18/12 0350  CHOL 165  HDL 58  LDLCALC 96  TRIG 57  CHOLHDL 2.8  LDLDIRECT --   Micro Results: Recent Results (from the past 240 hour(s))  MRSA PCR SCREENING     Status: Normal   Collection Time   08/17/12  5:30 PM      Component Value Range Status Comment   MRSA by PCR NEGATIVE  NEGATIVE Final     Studies/Results: Ct Head Wo Contrast  08/17/2012  *RADIOLOGY REPORT*  Clinical Data: 78 year old male with slurred speech.  CT HEAD WITHOUT CONTRAST  Technique:  Contiguous axial images were obtained from the base of the skull through the vertex without contrast.  Comparison: Brain MRI 03/31/2011 and earlier.  CT 03/30/2011.  Findings: Visualized paranasal sinuses and mastoids are clear. Visualized orbits and scalp soft tissues are within normal limits.  Stable calvarium with chronic venous vasculature greater on the right. No acute  osseous abnormality identified.  Calcified atherosclerosis at the skull base.  Stable chronic linear left corona radiata lacunar infarct. Stable chronic bilateral caudate lacunar infarcts.  New hypodensity at the genu of the right internal capsule, appears chronic.  Patchy additional bilateral cerebral white matter hypodensity. No evidence of cortically based acute infarction identified.  No acute intracranial hemorrhage identified.  No midline shift, mass effect, or evidence of mass lesion.  No ventriculomegaly. No suspicious intracranial vascular hyperdensity.  IMPRESSION: 1. No acute intracranial abnormality. 2.  Chronic small vessel ischemia with progression since 2012.   Original Report Authenticated By: Harley Hallmark, M.D.    Mr [name] Wo Contrast  08/17/2012  *RADIOLOGY REPORT*  Clinical Data:  Slurred speech.  MRI BRAIN WITHOUT CONTRAST MRA HEAD WITHOUT CONTRAST  Technique: Multiplanar, multiecho pulse sequences of the brain and surrounding structures were obtained according to standard protocol without intravenous contrast.  Angiographic images of the head were obtained using MRA technique without contrast.  Comparison: 08/17/2012 CT.  MRI HEAD  Findings:  Small non hemorrhagic acute infarct extends from the posterior superior right lenticular nucleus into the posterior right corona radiata.  Remote small left corona radiata infarct.  Remote tiny right thalamic infarct.  Remote small caudate head infarcts.  Remote small right globus  pallidus infarct.  Small vessel disease type changes.  No intracranial hemorrhage.  Probable developmental venous anomaly left cerebellum.  Otherwise no evidence of intracranial mass lesion noted on this unenhanced exam.  Global atrophy without hydrocephalus.  IMPRESSION:  Small non hemorrhagic acute infarct extends from the posterior superior right lenticular nucleus into the posterior right corona radiata.  Remote infarcts and small vessel disease type changes as noted  above.  Probable small developmental venous anomaly left cerebellum.  Global atrophy.  Minimal mucosal thickening paranasal sinuses and partial opacification right mastoid air cells.  MRA HEAD  Findings: Fusiform ectasia of the distal vertical segment of the left internal carotid artery.  The internal carotid arteries are ectatic bilaterally.  Prominence of the right posterior communicating artery without discrete saccular aneurysm.  Anterior circulation without medium or large size vessel significant stenosis or occlusion.  Middle cerebral artery moderate to marked branch vessel irregularity bilaterally.  Ectatic vertebral arteries and basilar artery.  Left vertebral artery is dominant in size.  Mild narrowing and irregularity of the vertebral arteries more prominent on the right.  Moderate narrowing and irregularity of the PICA bilaterally.  Ectatic basilar artery with mild irregularity and narrowing without high-grade stenosis.  Non visualized AICAs.  Prominent superior cerebellar arteries.  Posterior cerebral artery moderate to marked distal branch vessel irregularity.  IMPRESSION: Intracranial atherosclerotic type changes as detailed above.  This is most notable involving branch vessels.  Critical Value/emergent results were called by telephone at the time of interpretation on 08/17/2012 at 2:00 p.m. to Dr. Oletta Lamas, who verbally acknowledged these results.   Original Report Authenticated By: Fuller Canada, M.D.    Mr Brain Wo Contrast  08/17/2012  *RADIOLOGY REPORT*  Clinical Data:  Slurred speech.  MRI BRAIN WITHOUT CONTRAST MRA HEAD WITHOUT CONTRAST  Technique: Multiplanar, multiecho pulse sequences of the brain and surrounding structures were obtained according to standard protocol without intravenous contrast.  Angiographic images of the head were obtained using MRA technique without contrast.  Comparison: 08/17/2012 CT.  MRI HEAD  Findings:  Small non hemorrhagic acute infarct extends from the posterior  superior right lenticular nucleus into the posterior right corona radiata.  Remote small left corona radiata infarct.  Remote tiny right thalamic infarct.  Remote small caudate head infarcts.  Remote small right globus pallidus infarct.  Small vessel disease type changes.  No intracranial hemorrhage.  Probable developmental venous anomaly left cerebellum.  Otherwise no evidence of intracranial mass lesion noted on this unenhanced exam.  Global atrophy without hydrocephalus.  IMPRESSION:  Small non hemorrhagic acute infarct extends from the posterior superior right lenticular nucleus into the posterior right corona radiata.  Remote infarcts and small vessel disease type changes as noted above.  Probable small developmental venous anomaly left cerebellum.  Global atrophy.  Minimal mucosal thickening paranasal sinuses and partial opacification right mastoid air cells.  MRA HEAD  Findings: Fusiform ectasia of the distal vertical segment of the left internal carotid artery.  The internal carotid arteries are ectatic bilaterally.  Prominence of the right posterior communicating artery without discrete saccular aneurysm.  Anterior circulation without medium or large size vessel significant stenosis or occlusion.  Middle cerebral artery moderate to marked branch vessel irregularity bilaterally.  Ectatic vertebral arteries and basilar artery.  Left vertebral artery is dominant in size.  Mild narrowing and irregularity of the vertebral arteries more prominent on the right.  Moderate narrowing and irregularity of the PICA bilaterally.  Ectatic basilar artery with mild irregularity and narrowing  without high-grade stenosis.  Non visualized AICAs.  Prominent superior cerebellar arteries.  Posterior cerebral artery moderate to marked distal branch vessel irregularity.  IMPRESSION: Intracranial atherosclerotic type changes as detailed above.  This is most notable involving branch vessels.  Critical Value/emergent results were  called by telephone at the time of interpretation on 08/17/2012 at 2:00 p.m. to Dr. Oletta Lamas, who verbally acknowledged these results.   Original Report Authenticated By: Fuller Canada, M.D.     Medications: I have reviewed the patient's current medications.  1- CVA (cerebral vascular accident)-Acute right small nonhemorrhagic infarct seen on MRI:  -MRI show: Small non hemorrhagic acute infarct extends from the posterior superior right lenticular nucleus into the posterior right corona radiata. -Patient was admitted  to step down unit due to A fib RVR. Speech therapy, PT, OT consulted. -Continue with Coumadin per pharmacy.  -I will defer neurology attending to start heparin.  -His recurrent stroke is likely related to A fib, I will defer to neuro ordering carotid doppler and ECHO.  -LDL 96, and HB-A 1c pending.   2-Atrial fibrillation with RVR:  I will continue with Cardizem at 5 mg, if BP decrease, will need to consider amiodarone or cardiology consult. Would try to avoid hypotension in the setting acute stroke. Holder parameter for SBP less than 100. Continue with Coumadin.  INR at 1.6  3-HYPERLIPIDEMIA: LDL 96. ANEMIA-NOS  HYPERTENSION  COPD  Post-splenectomy  Long term current use of anticoagulant     LOS: 1 day   REGALADO,BELKYS M.D.  [facility] 08/18/2012, 8:04 AM

## 2012-08-18 NOTE — Progress Notes (Signed)
PT Cancellation Note  Treatment cancelled today due to patient receiving procedure or test.  Patient in X-ray.  [name],Leonard Winzer 08/18/2012, 11:55 AM  [facility] [phone] (pager)

## 2012-08-19 DIAGNOSIS — D649 Anemia, unspecified: Secondary | ICD-10-CM

## 2012-08-19 DIAGNOSIS — J449 Chronic obstructive pulmonary disease, unspecified: Secondary | ICD-10-CM

## 2012-08-19 DIAGNOSIS — F411 Generalized anxiety disorder: Secondary | ICD-10-CM

## 2012-08-19 LAB — PROTIME-INR
INR: 3.01 — ABNORMAL HIGH (ref 0.00–1.49)
INR: 3.31 — ABNORMAL HIGH (ref 0.00–1.49)
Prothrombin Time: 31.7 seconds — ABNORMAL HIGH (ref 11.6–15.2)
Prothrombin Time: 34.1 seconds — ABNORMAL HIGH (ref 11.6–15.2)

## 2012-08-19 LAB — BASIC METABOLIC PANEL
CO2: 29 mEq/L (ref 19–32)
Calcium: 9.7 mg/dL (ref 8.4–10.5)
GFR calc non Af Amer: 57 mL/min — ABNORMAL LOW (ref >90)
Potassium: 4.3 mEq/L (ref 3.5–5.1)
Sodium: 138 mEq/L (ref 135–145)

## 2012-08-19 LAB — CBC
Hemoglobin: 14.1 g/dL (ref 13.0–17.0)
MCH: 34.4 pg — ABNORMAL HIGH (ref 26.0–34.0)
Platelets: 150 10*3/uL (ref 150–400)
RBC: 4.1 MIL/uL — ABNORMAL LOW (ref 4.22–5.81)

## 2012-08-19 MED ORDER — METOPROLOL TARTRATE 1 MG/ML IV SOLN
5.0000 mg | Freq: Once | INTRAVENOUS | Status: AC
  Administered 2012-08-19: 5 mg via INTRAVENOUS
  Filled 2012-08-19: qty 5

## 2012-08-19 MED ORDER — DILTIAZEM HCL ER COATED BEADS 120 MG PO CP24
120.0000 mg | ORAL_CAPSULE | Freq: Every day | ORAL | Status: DC
  Administered 2012-08-19: 120 mg via ORAL
  Filled 2012-08-19 (×2): qty 1

## 2012-08-19 MED ORDER — CARVEDILOL 6.25 MG PO TABS
6.2500 mg | ORAL_TABLET | Freq: Two times a day (BID) | ORAL | Status: DC
  Administered 2012-08-19 – 2012-08-21 (×4): 6.25 mg via ORAL
  Filled 2012-08-19 (×6): qty 1

## 2012-08-19 NOTE — Progress Notes (Signed)
Subjective: Feeling well, eating breakfast, wife at bedside.  Swallowing better, has some pain after he took colace. Feels weak from been in bed.  Speech clear.  Objective: Filed Vitals:   08/18/12 2009 08/19/12 0000 08/19/12 0345 08/19/12 0737  BP: 110/66 121/82 113/85 121/75  Pulse: 135 103 89   Temp: 97.6 F (36.4 C) 97.8 F (36.6 C) 97.6 F (36.4 C) 98.2 F (36.8 C)  TempSrc: Oral Oral Axillary Oral  Resp: 15 18 12    Height:      Weight:   53.1 kg (117 lb 1 oz)   SpO2: 100% 97% 97%    Weight change: 0.6 kg (1 lb 5.2 oz)   General: Alert, awake, oriented x3, in no acute distress.  HEENT: No bruits, no goiter.  Heart: IRRegular rate and rhythm, without murmurs, rubs, gallops.  Lungs: CTA, bilateral air movement.  Abdomen: Soft, nontender, nondistended, positive bowel sounds.  Neuro: Grossly intact, nonfocal.    Lab Results:  Basename 08/19/12 0500 08/17/12 1121  NA 138 139  K 4.3 4.7  CL 100 100  CO2 29 32  GLUCOSE 94 84  BUN 26* 32*  CREATININE 1.15 1.08  CALCIUM 9.7 9.6  MG -- --  PHOS -- --   Basename 08/19/12 0500 08/17/12 1121  WBC 6.6 5.6  NEUTROABS -- 4.0  HGB 14.1 13.0  HCT 40.2 38.5*  MCV 98.0 99.7  PLT 150 149*   Basename 08/18/12 0350  HGBA1C 6.0*    Basename 08/18/12 0350  CHOL 165  HDL 58  LDLCALC 96  TRIG 57  CHOLHDL 2.8  LDLDIRECT --    Micro Results: Recent Results (from the past 240 hour(s))  MRSA PCR SCREENING     Status: Normal   Collection Time   08/17/12  5:30 PM      Component Value Range Status Comment   MRSA by PCR NEGATIVE  NEGATIVE Final     Studies/Results: Ct Head Wo Contrast  08/17/2012  *RADIOLOGY REPORT*  Clinical Data: 78 year old male with slurred speech.  CT HEAD WITHOUT CONTRAST  Technique:  Contiguous axial images were obtained from the base of the skull through the vertex without contrast.  Comparison: Brain MRI 03/31/2011 and earlier.  CT 03/30/2011.  Findings: Visualized paranasal sinuses and  mastoids are clear. Visualized orbits and scalp soft tissues are within normal limits.  Stable calvarium with chronic venous vasculature greater on the right. No acute osseous abnormality identified.  Calcified atherosclerosis at the skull base.  Stable chronic linear left corona radiata lacunar infarct. Stable chronic bilateral caudate lacunar infarcts.  New hypodensity at the genu of the right internal capsule, appears chronic.  Patchy additional bilateral cerebral white matter hypodensity. No evidence of cortically based acute infarction identified.  No acute intracranial hemorrhage identified.  No midline shift, mass effect, or evidence of mass lesion.  No ventriculomegaly. No suspicious intracranial vascular hyperdensity.  IMPRESSION: 1. No acute intracranial abnormality. 2.  Chronic small vessel ischemia with progression since 2012.   Original Report Authenticated By: Harley Hallmark, M.D.    Mr [name] Wo Contrast  08/17/2012  *RADIOLOGY REPORT*  Clinical Data:  Slurred speech.  MRI BRAIN WITHOUT CONTRAST MRA HEAD WITHOUT CONTRAST  Technique: Multiplanar, multiecho pulse sequences of the brain and surrounding structures were obtained according to standard protocol without intravenous contrast.  Angiographic images of the head were obtained using MRA technique without contrast.  Comparison: 08/17/2012 CT.  MRI HEAD  Findings:  Small non hemorrhagic acute infarct extends  from the posterior superior right lenticular nucleus into the posterior right corona radiata.  Remote small left corona radiata infarct.  Remote tiny right thalamic infarct.  Remote small caudate head infarcts.  Remote small right globus pallidus infarct.  Small vessel disease type changes.  No intracranial hemorrhage.  Probable developmental venous anomaly left cerebellum.  Otherwise no evidence of intracranial mass lesion noted on this unenhanced exam.  Global atrophy without hydrocephalus.  IMPRESSION:  Small non hemorrhagic acute infarct  extends from the posterior superior right lenticular nucleus into the posterior right corona radiata.  Remote infarcts and small vessel disease type changes as noted above.  Probable small developmental venous anomaly left cerebellum.  Global atrophy.  Minimal mucosal thickening paranasal sinuses and partial opacification right mastoid air cells.  MRA HEAD  Findings: Fusiform ectasia of the distal vertical segment of the left internal carotid artery.  The internal carotid arteries are ectatic bilaterally.  Prominence of the right posterior communicating artery without discrete saccular aneurysm.  Anterior circulation without medium or large size vessel significant stenosis or occlusion.  Middle cerebral artery moderate to marked branch vessel irregularity bilaterally.  Ectatic vertebral arteries and basilar artery.  Left vertebral artery is dominant in size.  Mild narrowing and irregularity of the vertebral arteries more prominent on the right.  Moderate narrowing and irregularity of the PICA bilaterally.  Ectatic basilar artery with mild irregularity and narrowing without high-grade stenosis.  Non visualized AICAs.  Prominent superior cerebellar arteries.  Posterior cerebral artery moderate to marked distal branch vessel irregularity.  IMPRESSION: Intracranial atherosclerotic type changes as detailed above.  This is most notable involving branch vessels.  Critical Value/emergent results were called by telephone at the time of interpretation on 08/17/2012 at 2:00 p.m. to Dr. Oletta Lamas, who verbally acknowledged these results.   Original Report Authenticated By: Fuller Canada, M.D.    Mr Brain Wo Contrast  08/17/2012  *RADIOLOGY REPORT*  Clinical Data:  Slurred speech.  MRI BRAIN WITHOUT CONTRAST MRA HEAD WITHOUT CONTRAST  Technique: Multiplanar, multiecho pulse sequences of the brain and surrounding structures were obtained according to standard protocol without intravenous contrast.  Angiographic images of the head  were obtained using MRA technique without contrast.  Comparison: 08/17/2012 CT.  MRI HEAD  Findings:  Small non hemorrhagic acute infarct extends from the posterior superior right lenticular nucleus into the posterior right corona radiata.  Remote small left corona radiata infarct.  Remote tiny right thalamic infarct.  Remote small caudate head infarcts.  Remote small right globus pallidus infarct.  Small vessel disease type changes.  No intracranial hemorrhage.  Probable developmental venous anomaly left cerebellum.  Otherwise no evidence of intracranial mass lesion noted on this unenhanced exam.  Global atrophy without hydrocephalus.  IMPRESSION:  Small non hemorrhagic acute infarct extends from the posterior superior right lenticular nucleus into the posterior right corona radiata.  Remote infarcts and small vessel disease type changes as noted above.  Probable small developmental venous anomaly left cerebellum.  Global atrophy.  Minimal mucosal thickening paranasal sinuses and partial opacification right mastoid air cells.  MRA HEAD  Findings: Fusiform ectasia of the distal vertical segment of the left internal carotid artery.  The internal carotid arteries are ectatic bilaterally.  Prominence of the right posterior communicating artery without discrete saccular aneurysm.  Anterior circulation without medium or large size vessel significant stenosis or occlusion.  Middle cerebral artery moderate to marked branch vessel irregularity bilaterally.  Ectatic vertebral arteries and basilar artery.  Left vertebral  artery is dominant in size.  Mild narrowing and irregularity of the vertebral arteries more prominent on the right.  Moderate narrowing and irregularity of the PICA bilaterally.  Ectatic basilar artery with mild irregularity and narrowing without high-grade stenosis.  Non visualized AICAs.  Prominent superior cerebellar arteries.  Posterior cerebral artery moderate to marked distal branch vessel irregularity.   IMPRESSION: Intracranial atherosclerotic type changes as detailed above.  This is most notable involving branch vessels.  Critical Value/emergent results were called by telephone at the time of interpretation on 08/17/2012 at 2:00 p.m. to Dr. Oletta Lamas, who verbally acknowledged these results.   Original Report Authenticated By: Fuller Canada, M.D.    Dg Swallowing Func-no Report  08/18/2012  CLINICAL DATA: stroke   FLUOROSCOPY FOR SWALLOWING FUNCTION STUDY:  Fluoroscopy was provided for swallowing function study, which was  administered by a speech pathologist.  Final results and recommendations  from this study are contained within the speech pathology report.      Medications: I have reviewed the patient's current medications.  1- CVA (cerebral vascular accident)-Acute right small nonhemorrhagic infarct seen on MRI:  -MRI show: Small non hemorrhagic acute infarct extends from the posterior superior right lenticular nucleus into the posterior right corona radiata.  -Patient was admitted to step down unit due to A fib RVR. Speech therapy, PT, OT consulted.  -Continue with Coumadin per pharmacy.  -His recurrent stroke is likely related to A fib, no need for ECHO or Carotid doppler.  -LDL 96, and HB-A 1c 6.0.   2-Atrial fibrillation with RVR:  Will transition Cardizem IV to oral. If HR still elevated will consider low dose coreg.  Continue with Coumadin.  INR at 3.0  3-HYPERLIPIDEMIA: LDL 96.  ANEMIA-NOS  HYPERTENSION  COPD  Post-splenectomy  Long term current use of anticoagulant   Disposititon: Continue step Down. If HR stable tomorrow will transfer to telemetry. Will consult inpatient rehab.      LOS: 2 days   Nashae Maudlin M.D.  [facility] 08/19/2012, 8:55 AM

## 2012-08-19 NOTE — Progress Notes (Signed)
Patient HR 130-140 afib. Notified M. Burnadette Peter, NP. New orders obtained and initiated.  Will continue to monitor pt and evaluate effectiveness of treatment

## 2012-08-19 NOTE — Progress Notes (Signed)
ANTICOAGULATION CONSULT NOTE - Initial Consult  Pharmacy Consult for Coumadin Indication: atrial fibrillation and stroke  Vital Signs: Temp: 97.2 F (36.2 C) (08/24 1146) Temp src: Axillary (08/24 1146) BP: 108/91 mmHg (08/24 1146) Pulse Rate: 115  (08/24 1401)  Labs:  Basename 08/19/12 0935 08/19/12 0500 08/18/12 0350 08/17/12 1121  HGB -- 14.1 -- 13.0  HCT -- 40.2 -- 38.5*  PLT -- 150 -- 149*  APTT -- -- -- 31  LABPROT 34.1* 31.7* 19.9* --  INR 3.31* 3.01* 1.66* --  HEPARINUNFRC -- -- -- --  CREATININE -- 1.15 -- 1.08  CKTOTAL -- -- -- --  CKMB -- -- -- --  TROPONINI -- -- -- --    Assessment: 78 yo male patient admitted with acute non-hemorrhagic stroke, on chronic coumadin for h/o stroke/afib. INR was subtherapeutic on admission but is now supratherapeutic after two doses of 7.5 mg. Initial INR this AM of 3.01. Due to large increase in INR (of ~1.4 increase), obtained a repeat INR which was still supratherapeutic at 3.31. Will hold Coumadin today and reassess in AM. Per pt takes 2.5 mg every day except 5 mg on Mondays (will update med rec). CBC is stable; no bleeding reported per RN.   Goal of Therapy:  INR 2-3 Monitor platelets by anticoagulation protocol: Yes   Plan:  No Coumadin today and f/u daily protime.   Nicolasa Ducking, PharmD Clinical Pharmacist Pgr [phone] 08/19/2012,2:50 PM

## 2012-08-19 NOTE — Progress Notes (Signed)
Stroke Team Progress Note  HISTORY [name] is an 78 y.o. male who developed slurred speech during breakfast with his wife around 7am 08/17/2012. He went to bed feeling "normal", then woke up to urinate "just after midnight". He says that he awoke fine, but developed above problem. Denies any weakness in extremities or with thought processes. Wife is present.  He does state that he has been in afib chronically and is on coumadin managed by the [facility] in [facility]. In the ER he was not rate controlled and was placed on Cardizem.  Patient was not a TPA candidate secondary to unknown time of symptom onset. He was admitted to the step down unit for further evaluation and treatment.  SUBJECTIVE I am ok.  Wants to get back to gardening.  Wife at bedside.  Indicates his speech is much improved and no confusion.   She is supportive of CIR before going home.  OBJECTIVE Most recent Vital Signs: Filed Vitals:   08/18/12 2009 08/19/12 0000 08/19/12 0345 08/19/12 0737  BP: 110/66 121/82 113/85 121/75  Pulse: 135 103 89   Temp: 97.6 F (36.4 C) 97.8 F (36.6 C) 97.6 F (36.4 C) 98.2 F (36.8 C)  TempSrc: Oral Oral Axillary Oral  Resp: 15 18 12    Height:      Weight:   53.1 kg (117 lb 1 oz)   SpO2: 100% 97% 97%    Telemetry:  Afib rate 110-120  Intake/Output from previous day: 08/23 0701 - 08/24 0700 In: 705 [P.O.:360; I.V.:345] Out: 1025 [Urine:1025]  IV Fluid Intake:      . diltiazem (CARDIZEM) infusion 5 mg/hr (08/18/12 2055)    MEDICATIONS     . diltiazem  120 mg Oral Daily  . docusate sodium  100 mg Oral BID  . sodium chloride  3 mL Intravenous Q12H  . warfarin  7.5 mg Oral ONCE-1800  . Warfarin - Pharmacist Dosing Inpatient   Does not apply q1800  . DISCONTD: enoxaparin (LOVENOX) injection  40 mg Subcutaneous Q24H   PRN:  sodium chloride, RESOURCE THICKENUP CLEAR, senna-docusate, sodium chloride  Diet:  Dysphagia  Activity:  Bedrest DVT Prophylaxis:   warfarin  CLINICALLY SIGNIFICANT STUDIES Basic Metabolic Panel:   Lab 08/19/12 0500 08/17/12 1121  NA 138 139  K 4.3 4.7  CL 100 100  CO2 29 32  GLUCOSE 94 84  BUN 26* 32*  CREATININE 1.15 1.08  CALCIUM 9.7 9.6  MG -- --  PHOS -- --   CBC:   Lab 08/19/12 0500 08/17/12 1121  WBC 6.6 5.6  NEUTROABS -- 4.0  HGB 14.1 13.0  HCT 40.2 38.5*  MCV 98.0 99.7  PLT 150 149*   Coagulation:   Lab 08/19/12 0500 08/18/12 0350 08/17/12 1121  LABPROT 31.7* 19.9* 20.3*  INR 3.01* 1.66* 1.70*   Urinalysis:   Lab 08/17/12 1630  COLORURINE YELLOW  LABSPEC 1.011  PHURINE 7.0  GLUCOSEU NEGATIVE  HGBUR TRACE*  BILIRUBINUR NEGATIVE  KETONESUR NEGATIVE  PROTEINUR NEGATIVE  UROBILINOGEN 0.2  NITRITE NEGATIVE  LEUKOCYTESUR TRACE*   Lipid Panel    Component Value Date/Time   CHOL 165 08/18/2012 0350   TRIG 57 08/18/2012 0350   HDL 58 08/18/2012 0350   CHOLHDL 2.8 08/18/2012 0350   VLDL 11 08/18/2012 0350   LDLCALC 96 08/18/2012 0350   HgbA1C  Lab Results  Component Value Date   HGBA1C 6.0* 08/18/2012    CT of the brain  08/17/2012   1. No acute  intracranial abnormality. 2.  Chronic small vessel ischemia with progression since 2012.     MRI of the brain  08/17/2012 Small non hemorrhagic acute infarct extends from the posterior superior right lenticular nucleus into the posterior right corona radiata. Remote infarcts and small vessel disease type changes  MRA of the brain  08/17/2012  Intracranial atherosclerotic type changes.  This is most notable involving branch vessels.    EKG  atrial fibrillation, rate 130.   Therapy Recommendations : PT: Pt with decreased balance, functional mobility and safety. Inpatient Rehab;Supervision for mobility/OOB  OT: Recommending CIR to further maximize independence and safety with ADLs before d/c home ST: mild oral dyspahgia with mild oral residual due to left labial and lingual weakness. More signficant oropharyngeal dysphagia with sensory/motor  deficits leading to a delay in swallow initiaton and silent aspriaiton of thin liquids before the swallow. Furthermore weakness of base of tongue and hyolaryngeal complex results in mild to moderate residual that necesitates verbal cues for a second swallow to clear. Pt is a moderate aspiration risk, reduced with nectar thick liquids and a mechanical soft diet with a second swallow and intermittent throat clear. Pt will need full supervision to follow given mild cognitive deficits.   Physical Exam   Sitting on commode.   NAD.  Kyphotic  Neurological Exam  Mental Status: Alert, oriented, thought content appropriate.  Speech fluent without evidence of aphasia. Able to follow 3 step commands without difficulty. Cranial Nerves: II-Visual fields grossly intact. III/IV/VI-Extraocular movements intact.  Pupils reactive bilaterally. V/VII-Smile symmetric VIII-grossly intact IX/X-not asssessed XI-bilateral shoulder shrug intact XII-midline tongue extension Motor:   Left grip slightly weaker than right. Fixation of left arm on rapid orbiting, mild left leg drift. Sensory: light touch intact throughout, bilaterally Deep Tendon Reflexes: 2+ and symmetric throughout Plantars: Downgoing bilaterally Cerebellar: Normal finger-to-nose.  Orbits Right around left hand. Gait: need assistance to get up from seated position, cautious, mild unsteady,  ASSESSMENT [name] is a 78 y.o. male presenting with small non hemorrhagic acute infarct extending from the posterior superior right lenticular nucleus into the posterior right corona radiata. Patient with resultant slurred speech,dysphagia, no notable weakness. Infarct felt to be embolic secondary to atrial fibrillation.  On warfarin prior to admission. INR 1.70 on arrival. Continue warfarin for secondary stroke prevention. IV heparin bridging recommended by neuro consult yesterday. INR now therapeutic.   -recurrent bladder cancer -hyperlipidemia, LDL  96 -hypertension -atrial fibrillation  A1C 6.0  Hospital day # 2  TREATMENT/PLAN  warfarin for secondary stroke prevention, per pharmacy D/C VTE prophylaxis now that INR therapeutic.   -OOB -MBSS -carotid Doppler not indicated as patient not a surgical candidate -2-D echocardiogram not indicated as patient already on warfarin -Recommend changing IV cardizem to PO.  Consider increasing dose to improve rate control. CIR consult- pt would be ideal candidate.  Great rehab potential, great home support.  Neurologic assessment is complete, INR is therapeutic.  Agree with ST/PT/OT recommendations.  No further neurologic intervention is recommended at this time.  If further questions arise, please call or page at that time. Patient will need 2 month OP follow up with Dr. Pearlean Brownie after discharge.  Thank you for allowing neurology to participate in the care of this patient.  Marya Fossa PA-C [facility] [phone] 08/19/2012 9:45 AM

## 2012-08-20 ENCOUNTER — Inpatient Hospital Stay (HOSPITAL_COMMUNITY): Payer: Medicare Other

## 2012-08-20 DIAGNOSIS — I4891 Unspecified atrial fibrillation: Secondary | ICD-10-CM

## 2012-08-20 LAB — PROTIME-INR
INR: 3.81 — ABNORMAL HIGH (ref 0.00–1.49)
Prothrombin Time: 38.1 seconds — ABNORMAL HIGH (ref 11.6–15.2)

## 2012-08-20 MED ORDER — DIGOXIN 250 MCG PO TABS
0.2500 mg | ORAL_TABLET | Freq: Every day | ORAL | Status: DC
  Administered 2012-08-20: 0.25 mg via ORAL
  Filled 2012-08-20 (×2): qty 1

## 2012-08-20 MED ORDER — DILTIAZEM HCL ER COATED BEADS 120 MG PO CP24
120.0000 mg | ORAL_CAPSULE | Freq: Every day | ORAL | Status: DC
  Administered 2012-08-20 – 2012-08-22 (×3): 120 mg via ORAL
  Filled 2012-08-20 (×4): qty 1

## 2012-08-20 MED ORDER — METOPROLOL TARTRATE 1 MG/ML IV SOLN
2.5000 mg | Freq: Once | INTRAVENOUS | Status: AC
  Administered 2012-08-20: 2.5 mg via INTRAVENOUS
  Filled 2012-08-20: qty 5

## 2012-08-20 NOTE — Progress Notes (Signed)
Came to evaluate patient, awake oriented, was able to tell me his son name. He was more sleepy earlier. Moving all 4 extremities. Pain right knee. Refuse pain medications. HR increase 130. Will check CT head rule out bleed, INR at 3. Will give 1 dose of IV metoprolol.

## 2012-08-20 NOTE — Progress Notes (Signed)
Patient alert and able to answer orientation questions appropriate x4. Neuro Assessment WNL in comparison to baseline. Grips and strength present, moderate and equal in all extremities. No facial droop noted. However, patient unable to identify visitors in room (Son, daughter-in-law); pt also appears drowsy in comparison to yesterday. He states that he is just sleepy and tired. Wife appears concerned about change. Dr. Sunnie Nielsen notified. Also notified Dr. Terrace Arabia, Neurologist rounding on pt.

## 2012-08-20 NOTE — Consult Note (Signed)
Reason for Consult: Atrial fibrillation Referring Physician: Dr. [name] is an 78 y.o. male.  HPI: This 78 year old gentleman was admitted on 08/17/12 with a small nonhemorrhagic acute cerebral infarct to be embolic secondary to atrial fibrillation.  [name] has a history of chronic atrial fibrillation and has been on long-term warfarin.  He is followed by Leonard Felts, PA at [facility] clinic in [place].  His family indicates that his prothrombin times are checked monthly and are normally within range.  At Leonard time of this admission however his INR was subtherapeutic at 1.7.  [name] also reports that approximately 2 years ago [name] had a small TIA and as they recall his INR may have been slightly subtherapeutic at that time.  Normally [name] is very active at home.  He still plays golf once a week.  He does not have any history of ischemic heart disease.  He does have a history of recurrent bladder cancer, hyperlipidemia, and essential hypertension.  At home his only regular prescription medications are Coumadin and diltiazem CD 120 mg daily.  Past Medical History  Diagnosis Date  . NEOPLASM, MALIGNANT, BLADDER 04/10/2009  . HYPERLIPIDEMIA 04/10/2009  . ANEMIA-NOS 04/10/2009  . 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 ANXIETY 04/10/2009  . HYPERTENSION 04/10/2009  . Atrial fibrillation 04/10/2009  . COPD 04/10/2009  . TRANSIENT ISCHEMIC ATTACK, HX OF 04/10/2009  . Bladder cancer     Dr. Darvin Neighbours  . Lacunar stroke 04/07/2011  . Pleural effusion 04/07/2011  . 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 Splenectomy   . Bilateral vats ablation   . Facial cancer     facial skin cancer    Family History  Problem Relation Age of Onset  . Heart disease Mother   . Arthritis Father     Social History:  reports that he has never smoked. He does not have any smokeless tobacco history on file. He reports that he does not drink alcohol.  His drug history not on file.  Allergies:  Allergies  Allergen Reactions  . Diazepam     REACTION: agitation  . Ezetimibe-Simvastatin   . Morphine Other (See Comments)    Hallucinations.  . Sulfonamide Derivatives Hives    Medications:  Scheduled:   . carvedilol  6.25 mg Oral BID WC  . diltiazem  120 mg Oral Daily  . docusate sodium  100 mg Oral BID  . metoprolol  2.5 mg Intravenous Once  . metoprolol  5 mg Intravenous Once  . sodium chloride  3 mL Intravenous Q12H  . Warfarin - Pharmacist Dosing Inpatient   Does not apply q1800  . DISCONTD: diltiazem  120 mg Oral Daily    Results for orders placed during Leonard hospital encounter of 08/17/12 (from Leonard past 48 hour(s))  PROTIME-INR     Status: Abnormal   Collection Time   08/19/12  5:00 AM      Component Value Range Comment   Prothrombin Time 31.7 (*) 11.6 - 15.2 seconds    INR 3.01 (*) 0.00 - 1.49   CBC     Status: Abnormal   Collection Time   08/19/12  5:00 AM      Component Value Range Comment   WBC 6.6  4.0 - 10.5 K/uL    RBC 4.10 (*) 4.22 - 5.81 MIL/uL    Hemoglobin 14.1  13.0 - 17.0 g/dL    HCT 16.1  09.6 - 04.5 %  MCV 98.0  78.0 - 100.0 fL    MCH 34.4 (*) 26.0 - 34.0 pg    MCHC 35.1  30.0 - 36.0 g/dL    RDW 45.4  09.8 - 11.9 %    Platelets 150  150 - 400 K/uL   BASIC METABOLIC PANEL     Status: Abnormal   Collection Time   08/19/12  5:00 AM      Component Value Range Comment   Sodium 138  135 - 145 mEq/L    Potassium 4.3  3.5 - 5.1 mEq/L    Chloride 100  96 - 112 mEq/L    CO2 29  19 - 32 mEq/L    Glucose, Bld 94  70 - 99 mg/dL    BUN 26 (*) 6 - 23 mg/dL    Creatinine, Ser 1.47  0.50 - 1.35 mg/dL    Calcium 9.7  8.4 - 82.9 mg/dL    GFR calc non Af Amer 57 (*) >90 mL/min    GFR calc Af Amer 66 (*) >90 mL/min   PROTIME-INR     Status: Abnormal   Collection Time   08/19/12  9:35 AM      Component Value Range Comment   Prothrombin Time 34.1 (*) 11.6 - 15.2 seconds    INR 3.31 (*) 0.00 - 1.49     PROTIME-INR     Status: Abnormal   Collection Time   08/20/12  8:40 AM      Component Value Range Comment   Prothrombin Time 38.1 (*) 11.6 - 15.2 seconds    INR 3.81 (*) 0.00 - 1.49   GLUCOSE, CAPILLARY     Status: Abnormal   Collection Time   08/20/12 12:30 PM      Component Value Range Comment   Glucose-Capillary 125 (*) 70 - 99 mg/dL     No results found.  Review of systems reveals that [name] reports a small amount of bleeding from Leonard tip of Leonard penis recently.  No history of any chest pain or shortness of breath Blood pressure 106/79, pulse 135, temperature 97.4 F (36.3 C), temperature source Oral, resp. rate 20, height 5\' 8"  (1.727 m), weight 115 lb 8.3 oz (52.4 kg), SpO2 95.00%. [name] appears to be in no distress.  Head and neck exam reveals that Leonard pupils are equal and reactive.  Leonard extraocular movements are full.  There is no scleral icterus.  Mouth and pharynx are benign.  No lymphadenopathy.  No carotid bruits.  Leonard jugular venous pressure is normal.  Thyroid is not enlarged or tender.  Chest is clear to percussion and auscultation.  No rales or rhonchi.  Expansion of Leonard chest is symmetrical.  Heart reveals no abnormal lift or heave.  First and second heart sounds are normal.  There is no murmur gallop rub or click.  Leonard pulse is rapid and irregular at 130 per minute as he is sitting up eating lunch.  Leonard abdomen is soft and nontender.  Bowel sounds are normoactive.  There is no hepatosplenomegaly or mass.  There are no abdominal bruits.  Extremities reveal no phlebitis or edema.  Pedal pulses are good.  There is no cyanosis or clubbing.  Neurologic exam reveals [name] is alert and cooperative.  Integument reveals no rash  EKG dated 08/17/12 atrial fibrillation with a rapid ventricular response.  No ischemic changes are seen.   Echocardiogram was last done on 04/01/09 and showed normal left ventricular systolic function with an ejection fraction  of  50-55%.  Assessment/Plan: 1. chronic established atrial fibrillation on long-term Coumadin, now with rapid ventricular response. 2. right brain embolic stroke secondary to atrial fibrillation 3. past history of hypertension but presently blood pressure has been soft.  Recommendation: Continue low-dose diltiazem CD 120 mg daily and continue low-dose carvedilol.  Because his blood pressure is soft and we do not want to induce hypotension in Leonard face of his recent stroke we will add low-dose digoxin for additional rate control. [name] had a recent echocardiogram at [facility] about 3 weeks ago but Leonard family has not heard from Leonard results yet.  Leonard family will bring a copy of Leonard report to Leonard hospital Monday to be entered into his shadow chart. Since [name] has had two neurologic events while on Coumadin he may be a candidate for one of Leonard newer alternatives to warfarin such as Xarelto.  Leonard family will be discussing this possibility with his primary cardiologist team after discharge. Will follow with you.  Cassell Clement 08/20/2012, 1:43 PM

## 2012-08-20 NOTE — Progress Notes (Signed)
Subjective: Had an episode of confusion last night. He is awake, able to tell me he is at [facility], recognized his wife.   Objective: Filed Vitals:   08/20/12 0025 08/20/12 0425 08/20/12 0634 08/20/12 0728  BP: 121/90 108/73 119/93 109/75  Pulse: 121 126 128   Temp: 97.9 F (36.6 C) 97.4 F (36.3 C)  97.9 F (36.6 C)  TempSrc: Axillary Oral  Oral  Resp: 18 12    Height:      Weight:  52.4 kg (115 lb 8.3 oz)    SpO2: 96% 97%     Weight change: -0.7 kg (-1 lb 8.7 oz)   General: Alert, awake, oriented x3, in no acute distress.  HEENT: No bruits, no goiter.  Heart: IRRegular rate and rhythm, without murmurs, rubs, gallops.  Lungs: CTA, bilateral air movement.  Abdomen: Soft, nontender, nondistended, positive bowel sounds.  Neuro: left grip weaker than right, strength left weaker than right. Painful to move left leg. Right leg good strength.    Lab Results:  Basename 08/19/12 0500 08/17/12 1121  NA 138 139  K 4.3 4.7  CL 100 100  CO2 29 32  GLUCOSE 94 84  BUN 26* 32*  CREATININE 1.15 1.08  CALCIUM 9.7 9.6  MG -- --  PHOS -- --    Basename 08/19/12 0500 08/17/12 1121  WBC 6.6 5.6  NEUTROABS -- 4.0  HGB 14.1 13.0  HCT 40.2 38.5*  MCV 98.0 99.7  PLT 150 149*    Basename 08/18/12 0350  HGBA1C 6.0*    Basename 08/18/12 0350  CHOL 165  HDL 58  LDLCALC 96  TRIG 57  CHOLHDL 2.8  LDLDIRECT --    Micro Results: Recent Results (from the past 240 hour(s))  MRSA PCR SCREENING     Status: Normal   Collection Time   08/17/12  5:30 PM      Component Value Range Status Comment   MRSA by PCR NEGATIVE  NEGATIVE Final     Studies/Results: Dg Swallowing Func-no Report  08/18/2012  CLINICAL DATA: stroke   FLUOROSCOPY FOR SWALLOWING FUNCTION STUDY:  Fluoroscopy was provided for swallowing function study, which was  administered by a speech pathologist.  Final results and recommendations  from this study are contained within the speech pathology report.       Medications: I have reviewed the patient's current medications.  1- CVA (cerebral vascular accident)-Acute right small nonhemorrhagic infarct seen on MRI:  -MRI show: Small non hemorrhagic acute infarct extends from the posterior superior right lenticular nucleus into the posterior right corona radiata.  -Patient was admitted to step down unit due to A fib RVR.  -Continue with Coumadin per pharmacy.  -His recurrent stroke is likely related to A fib, no need for ECHO or Carotid doppler.  -LDL 96, and HB-A 1c 6.0.  -Episode of confusion probably related to delirium, monitor neuro exam.   2-Atrial fibrillation with RVR:  Transition Cardizem IV to oral 8-24 . Patient started on Coreg 6.25 BID 8-24.  Continue with Coreg and oral Cardizem.  HR overnight increase to 130 to 140. Received dose metoprolol IV.  SBP 100 to 109, HR in the 120 range. I will consult cardio to help with rate control. I would like to avoid hypotension in setting of acute stroke.  Continue with Coumadin.  INR pending.   3-HYPERLIPIDEMIA: LDL 96.  ANEMIA-NOS  HYPERTENSION  COPD  Post-splenectomy  Long term current use of anticoagulant      LOS:  3 days   Hermilo Dutter M.D.  [facility] 08/20/2012, 8:15 AM

## 2012-08-20 NOTE — Progress Notes (Signed)
ANTICOAGULATION CONSULT NOTE - Initial Consult  Pharmacy Consult for Coumadin Indication: atrial fibrillation and stroke  Vital Signs: Temp: 97.4 F (36.3 C) (08/25 1137) Temp src: Oral (08/25 1137) BP: 106/79 mmHg (08/25 1137) Pulse Rate: 135  (08/25 1300)  Labs:  Basename 08/20/12 0840 08/19/12 0935 08/19/12 0500  HGB -- -- 14.1  HCT -- -- 40.2  PLT -- -- 150  APTT -- -- --  LABPROT 38.1* 34.1* 31.7*  INR 3.81* 3.31* 3.01*  HEPARINUNFRC -- -- --  CREATININE -- -- 1.15  CKTOTAL -- -- --  CKMB -- -- --  TROPONINI -- -- --    Assessment: 78 yo male patient admitted with acute non-hemorrhagic stroke, on chronic coumadin for h/o stroke/afib. INR was subtherapeutic on admission but now remains supratherapeutic after two doses of 7.5 mg. INR has increased to 3.81 from 3.31. Will hold Coumadin today and reassess in AM. Per pt takes 2.5 mg every day except 5 mg on Mondays (med rec updated). CBC has been stable; no bleeding reported per RN.   Goal of Therapy:  INR 2-3 Monitor platelets by anticoagulation protocol: Yes   Plan:  No Coumadin today and f/u daily INR in AM.   Nicolasa Ducking, PharmD Clinical Pharmacist Pgr [phone] 08/20/2012,2:02 PM

## 2012-08-21 ENCOUNTER — Inpatient Hospital Stay (HOSPITAL_COMMUNITY): Payer: Medicare Other

## 2012-08-21 DIAGNOSIS — M25562 Pain in left knee: Secondary | ICD-10-CM | POA: Diagnosis present

## 2012-08-21 DIAGNOSIS — I6330 Cerebral infarction due to thrombosis of unspecified cerebral artery: Secondary | ICD-10-CM

## 2012-08-21 DIAGNOSIS — R7989 Other specified abnormal findings of blood chemistry: Secondary | ICD-10-CM | POA: Diagnosis present

## 2012-08-21 DIAGNOSIS — G9340 Encephalopathy, unspecified: Secondary | ICD-10-CM

## 2012-08-21 DIAGNOSIS — N19 Unspecified kidney failure: Secondary | ICD-10-CM

## 2012-08-21 DIAGNOSIS — M25569 Pain in unspecified knee: Secondary | ICD-10-CM

## 2012-08-21 DIAGNOSIS — M109 Gout, unspecified: Secondary | ICD-10-CM

## 2012-08-21 LAB — URINALYSIS, ROUTINE W REFLEX MICROSCOPIC
Glucose, UA: NEGATIVE mg/dL
Ketones, ur: NEGATIVE mg/dL
pH: 6 (ref 5.0–8.0)

## 2012-08-21 LAB — URINE MICROSCOPIC-ADD ON

## 2012-08-21 LAB — URIC ACID: Uric Acid, Serum: 4.2 mg/dL (ref 4.0–7.8)

## 2012-08-21 LAB — BASIC METABOLIC PANEL
CO2: 30 mEq/L (ref 19–32)
Chloride: 98 mEq/L (ref 96–112)
Creatinine, Ser: 1.15 mg/dL (ref 0.50–1.35)

## 2012-08-21 LAB — PROTIME-INR: INR: 3.32 — ABNORMAL HIGH (ref 0.00–1.49)

## 2012-08-21 LAB — CBC
Hemoglobin: 12.6 g/dL — ABNORMAL LOW (ref 13.0–17.0)
RBC: 3.77 MIL/uL — ABNORMAL LOW (ref 4.22–5.81)

## 2012-08-21 MED ORDER — PREDNISONE 20 MG PO TABS
40.0000 mg | ORAL_TABLET | Freq: Every day | ORAL | Status: DC
  Administered 2012-08-21: 40 mg via ORAL
  Filled 2012-08-21 (×2): qty 2

## 2012-08-21 MED ORDER — CARVEDILOL 3.125 MG PO TABS
3.1250 mg | ORAL_TABLET | Freq: Two times a day (BID) | ORAL | Status: DC
  Administered 2012-08-21 – 2012-08-25 (×8): 3.125 mg via ORAL
  Filled 2012-08-21 (×10): qty 1

## 2012-08-21 MED ORDER — ACETAMINOPHEN 325 MG PO TABS
650.0000 mg | ORAL_TABLET | Freq: Four times a day (QID) | ORAL | Status: DC | PRN
  Administered 2012-08-21: 650 mg via ORAL
  Filled 2012-08-21: qty 2

## 2012-08-21 MED ORDER — DEXTROSE 5 % IV SOLN
1.0000 g | INTRAVENOUS | Status: DC
  Administered 2012-08-21 – 2012-08-24 (×4): 1 g via INTRAVENOUS
  Filled 2012-08-21 (×5): qty 10

## 2012-08-21 MED ORDER — WARFARIN SODIUM 2.5 MG PO TABS
2.5000 mg | ORAL_TABLET | Freq: Once | ORAL | Status: AC
  Administered 2012-08-21: 2.5 mg via ORAL
  Filled 2012-08-21: qty 1

## 2012-08-21 MED ORDER — POLYETHYLENE GLYCOL 3350 17 G PO PACK
17.0000 g | PACK | Freq: Two times a day (BID) | ORAL | Status: DC
  Administered 2012-08-21 – 2012-08-24 (×8): 17 g via ORAL
  Filled 2012-08-21 (×10): qty 1

## 2012-08-21 MED ORDER — DIGOXIN 250 MCG PO TABS
0.2500 mg | ORAL_TABLET | Freq: Two times a day (BID) | ORAL | Status: AC
  Administered 2012-08-21 (×2): 0.25 mg via ORAL
  Filled 2012-08-21 (×2): qty 1

## 2012-08-21 MED ORDER — SODIUM CHLORIDE 0.9 % IV SOLN
INTRAVENOUS | Status: DC
  Administered 2012-08-21 – 2012-08-22 (×2): via INTRAVENOUS

## 2012-08-21 MED ORDER — COLCHICINE 0.6 MG PO TABS
0.6000 mg | ORAL_TABLET | Freq: Every day | ORAL | Status: DC
  Administered 2012-08-21 – 2012-08-25 (×5): 0.6 mg via ORAL
  Filled 2012-08-21 (×6): qty 1

## 2012-08-21 MED ORDER — SODIUM CHLORIDE 0.9 % IV BOLUS (SEPSIS)
500.0000 mL | Freq: Once | INTRAVENOUS | Status: AC
  Administered 2012-08-21: 500 mL via INTRAVENOUS

## 2012-08-21 NOTE — Progress Notes (Signed)
Speech Language Pathology Dysphagia Treatment Patient Details Name: [name] MRN: [id number] DOB: [birth year] Today's Date: 08/21/2012 Time: 4098-1191 SLP Time Calculation (min): 15 min  Assessment / Plan / Recommendation Clinical Impression  Pt more lethargic during today's visit though still attending to PO with max/total assist from wife. SLP observed PO trials provided by wife with signs of mild penetration with thin liquids. SLP provided modertae verbal/tactile cues for pt to clear his throat and swallow twice consistently. Provided education to wife regarding thickening liquids, aspiration precautions. Despite decreased LOA following UTI, pt is safe to continue current diet with full supervision.     Diet Recommendation  Continue with Current Diet: Dysphagia 3 (mechanical soft);Nectar-thick liquid    SLP Plan Continue with current plan of care   Pertinent Vitals/Pain NA   Swallowing Goals  SLP Swallowing Goals Patient will consume recommended diet without observed clinical signs of aspiration with: Modified independent assistance Swallow Study Goal #1 - Progress: Progressing toward goal Patient will utilize recommended strategies during swallow to increase swallowing safety with: Modified independent assistance Swallow Study Goal #2 - Progress: Progressing toward goal  General Temperature Spikes Noted: No Respiratory Status: Room air Behavior/Cognition: Cooperative;Lethargic;Distractible Oral Cavity - Dentition: Adequate natural dentition Patient Positioning: Upright in bed  Oral Cavity - Oral Hygiene Does patient have any of the following "at risk" factors?: Diet - patient on thickened liquids Patient is AT RISK - Oral Care Protocol followed (see row info): Yes   Dysphagia Treatment Treatment focused on: Skilled observation of diet tolerance;Patient/family/caregiver education;Utilization of compensatory strategies Family/Caregiver Educated: wife Treatment  Methods/Modalities: Skilled observation Patient observed directly with PO's: Yes Type of PO's observed: Dysphagia 3 (soft);Nectar-thick liquids Feeding: Able to feed self;Needs assist Liquids provided via: Cup Oral Phase Signs & Symptoms: Prolonged mastication;Prolonged bolus formation;Prolonged oral phase Pharyngeal Phase Signs & Symptoms: Suspected delayed swallow initiation;Wet vocal quality;Delayed throat clear Type of cueing: Verbal;Tactile Amount of cueing: Moderate   GO    Harlon Ditty, [place] CCC-SLP [phone]  Claudine Mouton 08/21/2012, 2:28 PM

## 2012-08-21 NOTE — Consult Note (Signed)
Physical Medicine and Rehabilitation Consult Reason for Consult: CVA Referring Physician: [facility]   HPI: [name] is a 78 y.o. right-handed male with history of atrophic fibrillation on chronic Coumadin therapy as well as history of TIA. Admitted a 2213 with slurred speech and altered mental status. MRI of the brain showed small nonhemorrhagic acute infarct extends from the posterior superior right lenticular nucleus and to the posterior right corona radiata. Remote infarcts and small vessel disease type changes left corona radiata and right thalamus infarct. MRA of the head with intracranial atherosclerotic type changes. INR on admission of 1.7. Followup cardiology services for history of atrial fibrillation with rapid ventricular response. Patient currently maintained on low dose Cardizem as well as coreg. Echocardiogram recently completed as outpatient with his cardiologist with results pending. 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  Speech therapy followup for dysphagia and maintained on mechanical soft nectar thick liquid diet. Physical occupational therapy ongoing with recommendations of physical medicine rehabilitation consult to consider inpatient rehabilitation services   Review of Systems  Eyes: Positive for blurred vision.  Cardiovascular: Positive for palpitations.  Musculoskeletal: Positive for myalgias.  Neurological: Positive for dizziness and weakness.  All other systems reviewed and are negative.   Past Medical History  Diagnosis Date  . NEOPLASM, MALIGNANT, BLADDER 04/10/2009  . HYPERLIPIDEMIA 04/10/2009  . ANEMIA-NOS 04/10/2009  . 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 ANXIETY 04/10/2009  . HYPERTENSION 04/10/2009  . Atrial fibrillation 04/10/2009  . COPD 04/10/2009  . TRANSIENT ISCHEMIC ATTACK, HX OF 04/10/2009  . Bladder cancer     Dr. Darvin Neighbours  . Lacunar stroke 04/07/2011  . Pleural effusion 04/07/2011  .  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 Splenectomy   . Bilateral vats ablation   . Facial cancer     facial skin cancer   Family History  Problem Relation Age of Onset  . Heart disease Mother   . Arthritis Father    Social History:  reports that he has never smoked. He does not have any smokeless tobacco history on file. He reports that he does not drink alcohol. His drug history not on file. Allergies:  Allergies  Allergen Reactions  . Diazepam     REACTION: agitation  . Ezetimibe-Simvastatin   . Morphine Other (See Comments)    Hallucinations.  . Sulfonamide Derivatives Hives   Medications Prior to Admission  Medication Sig Dispense Refill  . Cholecalciferol (VITAMIN D) 2000 UNITS tablet Take 2,000-3,000 Units by mouth daily as needed. Patient takes 2000 units daily, except for taking 3000 units (1.5 tablets) on "winter days" and days when "not feeling well."      . diltiazem (DILACOR XR) 120 MG 24 hr capsule Take 120 mg by mouth daily.      Marland Kitchen warfarin (COUMADIN) 5 MG tablet Take 2.5-5 mg by mouth daily. Take 2.5 mg every day except take 5 mg on Mondays        Home: Home Living Lives With: Spouse Available Help at Discharge: Family;Available 24 hours/day Type of Home: House Home Access: Stairs to enter [employer] of Steps: 4 Entrance Stairs-Rails: None Home Layout: One level Bathroom Shower/Tub: Engineer, manufacturing systems: Standard Bathroom Accessibility: Yes How Accessible: Accessible via walker Home Adaptive Equipment: None  Functional History: Prior Function Able to Take Stairs?: Yes Driving: Yes Vocation: Retired Functional Status:  Mobility: Bed Mobility Bed Mobility: Sit to Supine;Supine to Sit;Sitting - Scoot to [place] of  Bed Supine to Sit: 3: Mod assist;HOB flat Sitting - Scoot to Edge of Bed: 3: Mod assist Sit to Supine: 3: Mod assist;HOB flat Transfers Transfers: Sit to Stand;Stand to Sit Sit to  Stand: 3: Mod assist;With upper extremity assist;From bed;From toilet Stand to Sit: 4: Min assist;To bed;To toilet;With armrests;With upper extremity assist Ambulation/Gait Ambulation/Gait Assistance: 1: +2 Total assist Ambulation/Gait: Patient Percentage: 70% Ambulation Distance (Feet): 25 Feet Assistive device: 2 person hand held assist Ambulation/Gait Assistance Details: Pt with slow shuffling gait although with increased ambulation(second trial), pt with bigger steps. Assist for stability as pt slightly unsteady during gait with narrow base of support. Pt with posterior lean as well in standing, cues for posterior pelvic tilt.  Gait Pattern: Step-to pattern;Shuffle;Narrow base of support;Trunk flexed;Decreased trunk rotation Gait velocity: decreased gait speed    ADL: ADL Lower Body Dressing: Performed;Supervision/safety Where Assessed - Lower Body Dressing: Unsupported sitting Toilet Transfer: Performed;+2 Total assistance Toilet Transfer Method: Sit to stand Toilet Transfer Equipment: Comfort height toilet;Grab bars Equipment Used: Gait belt Transfers/Ambulation Related to ADLs: +2 total pt 70% for ambulation to bathroom. Assist for steadying and balance.  Pt initially with slow shuffled steps but began taking larger steps toward end. ADL Comments: Pt donned bil socks sitting EOB.  Pt requesting to walk to bathroom to attempt having BM on commode.    Cognition: Cognition Overall Cognitive Status: Impaired Arousal/Alertness: Awake/alert Orientation Level: Oriented X4 Attention: Focused;Sustained;Selective Focused Attention: Appears intact Sustained Attention: Appears intact Selective Attention: Impaired Selective Attention Impairment: Verbal complex;Functional complex Memory: Impaired Memory Impairment: Decreased short term memory Decreased Short Term Memory: Verbal complex Awareness: Impaired Awareness Impairment: Intellectual impairment;Emergent impairment;Anticipatory  impairment Problem Solving: Impaired Problem Solving Impairment: Functional basic;Verbal basic Executive Function: Reasoning;Sequencing;Self Monitoring;Self Correcting Reasoning: Impaired Reasoning Impairment: Verbal complex;Functional basic Sequencing: Impaired Sequencing Impairment: Verbal complex Self Monitoring: Impaired Self Monitoring Impairment: Verbal complex;Functional basic Self Correcting: Impaired Self Correcting Impairment: Verbal complex;Functional basic Safety/Judgment: Impaired Cognition Overall Cognitive Status: Impaired Area of Impairment: Other (comment);Problem solving (increased time for processing information) Arousal/Alertness: Awake/alert Orientation Level: Appears intact for tasks assessed Behavior During Session: [facility] for tasks performed Problem Solving: Required significant verbal cueing to sequence bed mobility tasks. Cognition - Other Comments: Pt required significantly increased time for processing questions/commands, often requiring therapist to repeat or reword question  Blood pressure 102/65, pulse 112, temperature 98.4 F (36.9 C), temperature source Oral, resp. rate 13, height 5\' 8"  (1.727 m), weight 52.1 kg (114 lb 13.8 oz), SpO2 97.00%. Physical Exam  Constitutional: He is oriented to person, place, and time. He appears well-developed.  HENT:  Head: Normocephalic.  Eyes:       Pupils round and reactive to light  Neck: Neck supple. No thyromegaly present.  Cardiovascular:       Irregular irregular  Pulmonary/Chest: Breath sounds normal. He has no wheezes.  Abdominal: Bowel sounds are normal. He exhibits no distension. There is no tenderness.  Musculoskeletal: He exhibits no edema.       Left knee tender, especially along the medial joint line. No swelling and only minimal warmth.   Neurological: He is alert and oriented to person, place, and time.       Patient was appropriate for age and date of birth and place. He has limited awareness of his  deficits and safety. He followed two-step commands. He was slow to initiate with all 4, but left side more affected. LUE grossly 2-3/5. LLE 1-2 (pain). Right side grossly 3/5. Withdraws to pain on  all 4's. Perhaps a mild left central 7 deficit. Speech slightyl slurred and hypophonic. Affect flat  Skin: Skin is warm and dry.    Results for orders placed during the hospital encounter of 08/17/12 (from the past 24 hour(s))  PROTIME-INR     Status: Abnormal   Collection Time   08/20/12  8:40 AM      Component Value Range   Prothrombin Time 38.1 (*) 11.6 - 15.2 seconds   INR 3.81 (*) 0.00 - 1.49  GLUCOSE, CAPILLARY     Status: Abnormal   Collection Time   08/20/12 12:30 PM      Component Value Range   Glucose-Capillary 125 (*) 70 - 99 mg/dL  PROTIME-INR     Status: Abnormal   Collection Time   08/21/12  4:20 AM      Component Value Range   Prothrombin Time 34.2 (*) 11.6 - 15.2 seconds   INR 3.32 (*) 0.00 - 1.49  BASIC METABOLIC PANEL     Status: Abnormal   Collection Time   08/21/12  4:20 AM      Component Value Range   Sodium 134 (*) 135 - 145 mEq/L   Potassium 4.3  3.5 - 5.1 mEq/L   Chloride 98  96 - 112 mEq/L   CO2 30  19 - 32 mEq/L   Glucose, Bld 129 (*) 70 - 99 mg/dL   BUN 34 (*) 6 - 23 mg/dL   Creatinine, Ser 5.78  0.50 - 1.35 mg/dL   Calcium 9.4  8.4 - 46.9 mg/dL   GFR calc non Af Amer 57 (*) >90 mL/min   GFR calc Af Amer 66 (*) >90 mL/min   Ct Head Wo Contrast  08/20/2012  *RADIOLOGY REPORT*  Clinical Data: Mental status change.  CT HEAD WITHOUT CONTRAST  Technique:  Contiguous axial images were obtained from the base of the skull through the vertex without contrast.  Comparison: Head CT 08/17/2012 and brain MRI 08/17/2012.  Findings: Stable age related cerebral atrophy and periventricular white matter disease.  Evolutionary change in the right basal ganglia infarct.  No new/acute hemispheric infarction and/or intracranial hemorrhage.  No extra-axial fluid collections.  The  brainstem and cerebellum are grossly normal and stable.  The calvarium is intact and appears stable.  The paranasal sinuses and mastoid air cells are clear.  IMPRESSION:  1.  Evolutionary change in the lacunar type right basal ganglia infarct. 2.  No findings for hemispheric infarction or intracranial hemorrhage. 3.  Stable atrophy and periventricular white matter disease.   Original Report Authenticated By: P. Loralie Champagne, M.D.     Assessment/Plan: Diagnosis: thrombotic right cva 1. Does the need for close, 24 hr/day medical supervision in concert with the patient's rehab needs make it unreasonable for this patient to be served in a less intensive setting? Yes 2. Co-Morbidities requiring supervision/potential complications: htn, copd, ?gout flare 3. Due to bladder management, bowel management, safety, skin/wound care, disease management, medication administration, pain management and patient education, does the patient require 24 hr/day rehab nursing? Yes 4. Does the patient require coordinated care of a physician, rehab nurse, PT (1-2 hrs/day, 5 days/week), OT (1-2 hrs/day, 5 days/week) and SLP (1-2 hrs/day, 5 days/week) to address physical and functional deficits in the context of the above medical diagnosis(es)? Yes Addressing deficits in the following areas: balance, endurance, locomotion, strength, transferring, bowel/bladder control, bathing, dressing, feeding, grooming, toileting, cognition, speech, language, swallowing and psychosocial support 5. Can the patient actively participate in an intensive therapy  program of at least 3 hrs of therapy per day at least 5 days per week? Yes and Potentially 6. The potential for patient to make measurable gains while on inpatient rehab is excellent 7. Anticipated functional outcomes upon discharge from inpatient rehab are supervision with PT, supervision to minimal assist with OT, supervision to mod ind with SLP. 8. Estimated rehab length of stay to  reach the above functional goals is: 2-3 weeks 9. Does the patient have adequate social supports to accommodate these discharge functional goals? Yes 10. Anticipated D/C setting: Home 11. Anticipated post D/C treatments: HH therapy 12. Overall Rehab/Functional Prognosis: excellent  RECOMMENDATIONS: This patient's condition is appropriate for continued rehabilitative care in the following setting: CIR Patient has agreed to participate in recommended program. Yes Note that insurance prior authorization may be required for reimbursement for recommended care.  Comment: Rehab RN to follow up.   Ivory Broad, MD     08/21/2012

## 2012-08-21 NOTE — Progress Notes (Signed)
Stroke Team Progress Note  HISTORY [name] is an 78 y.o. male who developed slurred speech during breakfast with his wife around 7am 08/17/2012. He went to bed feeling "normal", then woke up to urinate "just after midnight". He says that he awoke fine, but developed above problem. Denies any weakness in extremities or with thought processes. Wife is present.  He does state that he has been in afib chronically and is on coumadin managed by the [facility] in [facility]. In the ER he was not rate controlled and was placed on Cardizem.  Patient was not a TPA candidate secondary to unknown time of symptom onset. He was admitted to the step down unit for further evaluation and treatment.  SUBJECTIVE Wife at bedside. Attending concerned with lethargy worsening yesterday. More alert this am, but not back to how he was on Fri.  OBJECTIVE Most recent Vital Signs: Filed Vitals:   08/21/12 0113 08/21/12 0419 08/21/12 0731 08/21/12 0931  BP: 95/58 102/65 95/62   Pulse: 118 112 108 118  Temp: 99.3 F (37.4 C) 98.4 F (36.9 C) 98.6 F (37 C)   TempSrc: Axillary Oral Oral   Resp: 13 13 17    Height:      Weight:  52.1 kg (114 lb 13.8 oz)    SpO2: 95% 97% 95%    Telemetry:  Afib rate 130s  Intake/Output from previous day: 08/25 0701 - 08/26 0700 In: 960 [P.O.:960] Out: 1250 [Urine:1250]  IV Fluid Intake:     . sodium chloride 75 mL/hr at 08/21/12 0932   MEDICATIONS    . carvedilol  3.125 mg Oral BID WC  . digoxin  0.25 mg Oral BID  . diltiazem  120 mg Oral Daily  . docusate sodium  100 mg Oral BID  . metoprolol  2.5 mg Intravenous Once  . polyethylene glycol  17 g Oral BID  . predniSONE  40 mg Oral Q breakfast  . sodium chloride  3 mL Intravenous Q12H  . warfarin  2.5 mg Oral ONCE-1800  . Warfarin - Pharmacist Dosing Inpatient   Does not apply q1800  . DISCONTD: carvedilol  6.25 mg Oral BID WC  . DISCONTD: digoxin  0.25 mg Oral Daily   PRN:  sodium chloride, acetaminophen,  RESOURCE THICKENUP CLEAR, senna-docusate, sodium chloride  Diet:  Dysphagia 3 nectar thick liquids Activity:  Up with assistance DVT Prophylaxis:  warfarin  CLINICALLY SIGNIFICANT STUDIES Basic Metabolic Panel:  Lab 08/21/12 1610 08/19/12 0500  NA 134* 138  K 4.3 4.3  CL 98 100  CO2 30 29  GLUCOSE 129* 94  BUN 34* 26*  CREATININE 1.15 1.15  CALCIUM 9.4 9.7  MG -- --  PHOS -- --   CBC:  Lab 08/19/12 0500 08/17/12 1121  WBC 6.6 5.6  NEUTROABS -- 4.0  HGB 14.1 13.0  HCT 40.2 38.5*  MCV 98.0 99.7  PLT 150 149*   Coagulation:   Lab 08/21/12 0420 08/20/12 0840 08/19/12 0935 08/19/12 0500  LABPROT 34.2* 38.1* 34.1* 31.7*  INR 3.32* 3.81* 3.31* 3.01*   Urinalysis:  Lab 08/17/12 1630  COLORURINE YELLOW  LABSPEC 1.011  PHURINE 7.0  GLUCOSEU NEGATIVE  HGBUR TRACE*  BILIRUBINUR NEGATIVE  KETONESUR NEGATIVE  PROTEINUR NEGATIVE  UROBILINOGEN 0.2  NITRITE NEGATIVE  LEUKOCYTESUR TRACE*   Lipid Panel    Component Value Date/Time   CHOL 165 08/18/2012 0350   TRIG 57 08/18/2012 0350   HDL 58 08/18/2012 0350   CHOLHDL 2.8 08/18/2012 0350  VLDL 11 08/18/2012 0350   LDLCALC 96 08/18/2012 0350   HgbA1C  Lab Results  Component Value Date   HGBA1C 6.0* 08/18/2012    CT of the brain 08/20/2012 1. Evolutionary change in the lacunar type right basal ganglia infarct. 2. No findings for hemispheric infarction or intracranial hemorrhage. 3. Stable atrophy and periventricular white matter disease.  08/17/2012   1. No acute intracranial abnormality. 2.  Chronic small vessel ischemia with progression since 2012.     MRI of the brain  08/17/2012 Small non hemorrhagic acute infarct extends from the posterior superior right lenticular nucleus into the posterior right corona radiata. Remote infarcts and small vessel disease type changes  MRA of the brain  08/17/2012  Intracranial atherosclerotic type changes.  This is most notable involving branch vessels.    L knee xray negative for fx,  other   CXR No active disease.  EKG  atrial fibrillation, rate 130.   Therapy Recommendations : PT: Pt with decreased balance, functional mobility and safety. Inpatient Rehab;Supervision for mobility/OOB  OT: Recommending CIR to further maximize independence and safety with ADLs before d/c home ST: mild oral dyspahgia with mild oral residual due to left labial and lingual weakness. More signficant oropharyngeal dysphagia with sensory/motor deficits leading to a delay in swallow initiaton and silent aspriaiton of thin liquids before the swallow. Furthermore weakness of base of tongue and hyolaryngeal complex results in mild to moderate residual that necesitates verbal cues for a second swallow to clear. Pt is a moderate aspiration risk, reduced with nectar thick liquids and a mechanical soft diet with a second swallow and intermittent throat clear. Pt will need full supervision to follow given mild cognitive deficits.   Physical Exam   Sitting on commode.   NAD.  Kyphotic Neurological Exam  Mental Status: Alert, oriented, thought content appropriate.  Speech fluent without evidence of aphasia. Able to follow 3 step commands without difficulty. Cranial Nerves: II-Visual fields grossly intact. III/IV/VI-Extraocular movements intact.  Pupils reactive bilaterally. V/VII-Smile symmetric VIII-grossly intact IX/X-not asssessed XI-bilateral shoulder shrug intact XII-midline tongue extension Motor:   Left grip slightly weaker than right. Fixation of left arm on rapid orbiting, mild left leg drift. Sensory: light touch intact throughout, bilaterally Deep Tendon Reflexes: 2+ and symmetric throughout Plantars: Downgoing bilaterally Cerebellar: Normal finger-to-nose.  Orbits Right around left hand. Gait: need assistance to get up from seated position, cautious, mild unsteady,  ASSESSMENT Mr. Emelio Schneller is a 78 y.o. male presenting with small non hemorrhagic acute infarct extending from the  posterior superior right lenticular nucleus into the posterior right corona radiata. Patient with resultant slurred speech,dysphagia, no notable weakness. Infarct felt to be embolic secondary to atrial fibrillation.  On warfarin prior to admission. INR 1.70 on arrival. Continue warfarin for secondary stroke prevention. INR now therapeutic.   New lethargy not stroke related. CT from yesterday unremarkable. Symptoms likely metabolic.  -atrial fibrillation. Current HR 130. On po cardizem. RN has not given this dose this am as she is concerned about hypotension. Instructed RN to resume; will give NS bolus.  -recurrent bladder cancer -hyperlipidemia, LDL 96 -hypertension  A1C 6.0  Hospital day # 4  TREATMENT/PLAN -continue warfarin for secondary stroke prevention -Recommend improved rate control. Continue cardizem in setting of low BP -500cc normal saline bolus due to low BP concerns  -carotid Doppler not indicated as patient not a surgical candidate -2-D echocardiogram not indicated as patient already on warfarin  SHARON BIBY, MSN, RN, ANVP-BC, ANP-BC, GNP-BC [facility] Pager: [phone] 08/21/2012 9:47 AM  Scribe for Dr. Delia Heady, Stroke Center Medical Director, who has personally reviewed chart, pertinent data, examined the patient and developed the plan of care. Pager:  [phone]

## 2012-08-21 NOTE — Progress Notes (Signed)
Subjective: Patient sleepy, open eyes, follow some command, was able to raise both arm.  He is complaining of left knee pain. He said he was not able to sleep well. He is complaining of left knee pain.  Objective: Filed Vitals:   08/20/12 1950 08/21/12 0113 08/21/12 0419 08/21/12 0731  BP: 107/54 95/58 102/65 95/62  Pulse: 109 118 112 108  Temp: 99.9 F (37.7 C) 99.3 F (37.4 C) 98.4 F (36.9 C) 98.6 F (37 C)  TempSrc: Axillary Axillary Oral Oral  Resp: 16 13 13 17   Height:      Weight:   52.1 kg (114 lb 13.8 oz)   SpO2: 95% 95% 97% 95%   Weight change: -0.3 kg (-10.6 oz)   General: Alert, awake, oriented x3, in no acute distress.  HEENT: No bruits, no goiter.  Heart: Regular rate and rhythm, without murmurs, rubs, gallops.  Lungs: CTA, bilateral air movement.  Abdomen: Soft, nontender, nondistended, positive bowel sounds.  Neuro: open eyes, fall sleep, move all 4 extremities, unable to move left leg due to knee pain.  Extremities; no edema. Left knee warm to touch. No redness, no joint effusion.    Lab Results:  Basename 08/21/12 0420 08/19/12 0500  NA 134* 138  K 4.3 4.3  CL 98 100  CO2 30 29  GLUCOSE 129* 94  BUN 34* 26*  CREATININE 1.15 1.15  CALCIUM 9.4 9.7  MG -- --  PHOS -- --   Basename 08/19/12 0500  WBC 6.6  NEUTROABS --  HGB 14.1  HCT 40.2  MCV 98.0  PLT 150    Micro Results: Recent Results (from the past 240 hour(s))  MRSA PCR SCREENING     Status: Normal   Collection Time   08/17/12  5:30 PM      Component Value Range Status Comment   MRSA by PCR NEGATIVE  NEGATIVE Final     Studies/Results: Ct Head Wo Contrast  08/20/2012  *RADIOLOGY REPORT*  Clinical Data: Mental status change.  CT HEAD WITHOUT CONTRAST  Technique:  Contiguous axial images were obtained from the base of the skull through the vertex without contrast.  Comparison: Head CT 08/17/2012 and brain MRI 08/17/2012.  Findings: Stable age related cerebral atrophy and  periventricular white matter disease.  Evolutionary change in the right basal ganglia infarct.  No new/acute hemispheric infarction and/or intracranial hemorrhage.  No extra-axial fluid collections.  The brainstem and cerebellum are grossly normal and stable.  The calvarium is intact and appears stable.  The paranasal sinuses and mastoid air cells are clear.  IMPRESSION:  1.  Evolutionary change in the lacunar type right basal ganglia infarct. 2.  No findings for hemispheric infarction or intracranial hemorrhage. 3.  Stable atrophy and periventricular white matter disease.   Original Report Authenticated By: P. Loralie Champagne, M.D.     Medications: I have reviewed the patient's current medications.  1- CVA (cerebral vascular accident)-Acute right small nonhemorrhagic infarct seen on MRI:  -MRI show: Small non hemorrhagic acute infarct extends from the posterior superior right lenticular nucleus into the posterior right corona radiata.  -Patient was admitted to step down unit due to A fib RVR.  -Continue with Coumadin per pharmacy.  -His recurrent stroke is likely related to A fib, no need for ECHO or Carotid doppler.  -LDL 96, and HB-A 1c 6.0.  -Episode of confusion probably related to delirium, vs another stroke. Patient mentation fluctuates. I will check Chest x ray to check for infection, aspiration, UA.  I will start IV fluids now that patient is not eating a lot. Fluids will help with BP also. CT head 8-25 no acute bleed.  INR therapeutic at 3.  - I inform neuro team of change in status. They will round on patient today.   2-Atrial fibrillation with RVR:  Transition Cardizem IV to oral 8-24 . Patient started on Coreg 6.25 BID 8-24. Coreg decrease to 3.125 today. Started on digoxin 8-25 increase dose today.  Continue with Cardizem.  Cardiology helping with management.  Continue with Coumadin.  INR at 3.   3-HYPERLIPIDEMIA: LDL 96.   4-Encephalopathy/AMS: Probably delirium in setting of  acute stroke, hospitalization, vs another stroke. Will due work up for infection although no fevers. Check Chest x ary, UA. CT head no acute bleed.   5-Left knee pain: differential arthritis, gout. Per wife no history of fall. Check x ray and uric acid. I will give 1 dose prednisone, tylenol.  6-Mild uremia: IV fluids, will monitor for volume overload.   ANEMIA-NOS  HYPERTENSION  COPD  Post-splenectomy  Long term current use of anticoagulant        LOS: 4 days   Kadience Macchi M.D.  [facility] 08/21/2012, 8:38 AM

## 2012-08-21 NOTE — Progress Notes (Signed)
Seeing pt for possible CIR admit.  Note current medical issues.  Will [phone].

## 2012-08-21 NOTE — Progress Notes (Signed)
ANTICOAGULATION CONSULT NOTE - Initial Consult  Pharmacy Consult for Coumadin Indication: atrial fibrillation and stroke  Vital Signs: Temp: 98.6 F (37 C) (08/26 0731) Temp src: Oral (08/26 0731) BP: 95/62 mmHg (08/26 0731) Pulse Rate: 108  (08/26 0731)  Labs:  [name] 08/21/12 0420 08/20/12 0840 08/19/12 0935 08/19/12 0500  HGB -- -- -- 14.1  HCT -- -- -- 40.2  PLT -- -- -- 150  APTT -- -- -- --  LABPROT 34.2* 38.1* 34.1* --  INR 3.32* 3.81* 3.31* --  HEPARINUNFRC -- -- -- --  CREATININE 1.15 -- -- 1.15  CKTOTAL -- -- -- --  CKMB -- -- -- --  TROPONINI -- -- -- --    Assessment: 78 yo male patient admitted with acute non-hemorrhagic stroke, on chronic coumadin for h/o stroke/afib. INR trending down to 3.32 today.   Will resume Coumadin today and reassess in AM. Per pt takes 2.5 mg every day except 5 mg on Mondays (med rec updated). CBC has been stable; no bleeding reported per RN.   Goal of Therapy:  INR 2-3 Monitor platelets by anticoagulation protocol: Yes   Plan:  Coumadin 2.5mg  x 1 dose today.  Continue daily INR checks.  Celedonio Miyamoto, PharmD, BCPS Clinical Pharmacist Pager [phone]  08/21/2012,8:36 AM

## 2012-08-21 NOTE — Progress Notes (Signed)
Subjective:  Patient had a good night.  No chest pain or dyspnea. Heart rate slightly slower but still in 120s atrial fib. BP remains soft which he states is not unusual for him.  Objective:  Vital Signs in the last 24 hours: Temp:  [97.4 F (36.3 C)-99.9 F (37.7 C)] 98.6 F (37 C) (08/26 0731) Pulse Rate:  [80-135] 108  (08/26 0731) Resp:  [13-22] 17  (08/26 0731) BP: (95-111)/(54-79) 95/62 mmHg (08/26 0731) SpO2:  [95 %-98 %] 95 % (08/26 0731) Weight:  [114 lb 13.8 oz (52.1 kg)] 114 lb 13.8 oz (52.1 kg) (08/26 0419)  Intake/Output from previous day: 08/25 0701 - 08/26 0700 In: 960 [P.O.:960] Out: 1250 [Urine:1250] Intake/Output from this shift:       . carvedilol  6.25 mg Oral BID WC  . digoxin  0.25 mg Oral Daily  . diltiazem  120 mg Oral Daily  . docusate sodium  100 mg Oral BID  . metoprolol  2.5 mg Intravenous Once  . sodium chloride  3 mL Intravenous Q12H  . Warfarin - Pharmacist Dosing Inpatient   Does not apply q1800      . DISCONTD: diltiazem (CARDIZEM) infusion 5 mg/hr (08/18/12 2055)    Physical Exam: The patient appears to be in no distress.  Head and neck exam reveals that the pupils are equal and reactive. The extraocular movements are full. There is no scleral icterus. Mouth and pharynx are benign. No lymphadenopathy. No carotid bruits. The jugular venous pressure is normal. Thyroid is not enlarged or tender.  Chest is clear to percussion and auscultation. No rales or rhonchi. Expansion of the chest is symmetrical.  Heart reveals no abnormal lift or heave. First and second heart sounds are normal. There is no murmur gallop rub or click. The pulse is rapid and irregular at 120 per minute at rest. The abdomen is soft and nontender. Bowel sounds are normoactive. There is no hepatosplenomegaly or mass. There are no abdominal bruits.  Extremities reveal no phlebitis or edema. Pedal pulses are good. There is no cyanosis or clubbing.  Neurologic exam  reveals patient is alert and cooperative.   Lab Results:  Basename 08/19/12 0500  WBC 6.6  HGB 14.1  PLT 150    Basename 08/21/12 0420 08/19/12 0500  NA 134* 138  K 4.3 4.3  CL 98 100  CO2 30 29  GLUCOSE 129* 94  BUN 34* 26*  CREATININE 1.15 1.15   No results found for this basename: TROPONINI:2,CK,MB:2 in the last 72 hours Hepatic Function Panel No results found for this basename: PROT,ALBUMIN,AST,ALT,ALKPHOS,BILITOT,BILIDIR,IBILI in the last 72 hours No results found for this basename: CHOL in the last 72 hours No results found for this basename: PROTIME in the last 72 hours  Imaging: Ct Head Wo Contrast  08/20/2012  *RADIOLOGY REPORT*  Clinical Data: Mental status change.  CT HEAD WITHOUT CONTRAST  Technique:  Contiguous axial images were obtained from the base of the skull through the vertex without contrast.  Comparison: Head CT 08/17/2012 and brain MRI 08/17/2012.  Findings: Stable age related cerebral atrophy and periventricular white matter disease.  Evolutionary change in the right basal ganglia infarct.  No new/acute hemispheric infarction and/or intracranial hemorrhage.  No extra-axial fluid collections.  The brainstem and cerebellum are grossly normal and stable.  The calvarium is intact and appears stable.  The paranasal sinuses and mastoid air cells are clear.  IMPRESSION:  1.  Evolutionary change in the lacunar type right basal ganglia  infarct. 2.  No findings for hemispheric infarction or intracranial hemorrhage. 3.  Stable atrophy and periventricular white matter disease.   Original Report Authenticated By: P. Loralie Champagne, M.D.     Cardiac Studies: Family to bring in copy of his recent echo done in [facility]. Assessment/Plan:  Patient Active Hospital Problem List: 1. chronic established atrial fibrillation on long-term Coumadin,  with rapid ventricular response.  2. right brain embolic stroke secondary to atrial fibrillation  3. past history of hypertension  but  blood pressure has been soft.  Plan: Decrease carvedilol to 3.125 mg BID          Give Lanoxin 0.25 po BID today          Continue Diltiazem CD 120 which he was on at home.  LOS: 4 days    Cassell Clement 08/21/2012, 8:08 AM

## 2012-08-21 NOTE — Progress Notes (Signed)
PT Cancellation Note  Treatment cancelled today due to medical issues with patient which prohibited therapy.  INGOLD,Hulon Ferron 08/21/2012, 1:10 PM  [facility] [phone] (pager)

## 2012-08-22 DIAGNOSIS — M1120 Other chondrocalcinosis, unspecified site: Secondary | ICD-10-CM | POA: Diagnosis present

## 2012-08-22 DIAGNOSIS — D72829 Elevated white blood cell count, unspecified: Secondary | ICD-10-CM

## 2012-08-22 DIAGNOSIS — N390 Urinary tract infection, site not specified: Secondary | ICD-10-CM | POA: Diagnosis present

## 2012-08-22 LAB — BASIC METABOLIC PANEL
Calcium: 9.2 mg/dL (ref 8.4–10.5)
GFR calc non Af Amer: 77 mL/min — ABNORMAL LOW (ref >90)
Sodium: 141 mEq/L (ref 135–145)

## 2012-08-22 LAB — URINE CULTURE: Colony Count: NO GROWTH

## 2012-08-22 LAB — CBC
MCH: 33.3 pg (ref 26.0–34.0)
MCHC: 33.8 g/dL (ref 30.0–36.0)
Platelets: 139 10*3/uL — ABNORMAL LOW (ref 150–400)
RDW: 14.3 % (ref 11.5–15.5)

## 2012-08-22 LAB — PROTIME-INR: Prothrombin Time: 36.9 seconds — ABNORMAL HIGH (ref 11.6–15.2)

## 2012-08-22 MED ORDER — DEXTROSE-NACL 5-0.45 % IV SOLN
INTRAVENOUS | Status: DC
  Administered 2012-08-22: 10:00:00 via INTRAVENOUS
  Administered 2012-08-23: 40 mL/h via INTRAVENOUS

## 2012-08-22 MED ORDER — BISACODYL 10 MG RE SUPP
10.0000 mg | Freq: Once | RECTAL | Status: DC
  Filled 2012-08-22: qty 1

## 2012-08-22 MED ORDER — DIGOXIN 125 MCG PO TABS
0.1250 mg | ORAL_TABLET | Freq: Every day | ORAL | Status: DC
  Administered 2012-08-22 – 2012-08-25 (×4): 0.125 mg via ORAL
  Filled 2012-08-22 (×4): qty 1

## 2012-08-22 NOTE — Progress Notes (Signed)
Nutrition Follow-up  Intervention:  Continue to encourage Magic Cups with trays.  Assessment:   MBSS on 8/23 recommending Dysphagia 3 with Nectar Thickened Liquids. Evaluated by CIR.  Pt is eating 50 - 75% of meals per patient and wife. Consuming Magic Cups on trays. Pt continues to decline Ensure Pudding and Ensure Complete, states that he doesn't "believe" in those types of supplements. Encouraged pt to continue to consume [employer] with all meals.  Diet Order:  Dysphagia 3 with Nectar Thickened Liquids  Meds: Scheduled Meds:   . carvedilol  3.125 mg Oral BID WC  . cefTRIAXone (ROCEPHIN)  IV  1 g Intravenous Q24H  . colchicine  0.6 mg Oral Daily  . digoxin  0.125 mg Oral Daily  . digoxin  0.25 mg Oral BID  . diltiazem  120 mg Oral Daily  . docusate sodium  100 mg Oral BID  . polyethylene glycol  17 g Oral BID  . sodium chloride  500 mL Intravenous Once  . sodium chloride  3 mL Intravenous Q12H  . warfarin  2.5 mg Oral ONCE-1800  . Warfarin - Pharmacist Dosing Inpatient   Does not apply q1800  . DISCONTD: predniSONE  40 mg Oral Q breakfast   Continuous Infusions:   . sodium chloride 75 mL/hr at 08/22/12 0300   PRN Meds:.sodium chloride, acetaminophen, RESOURCE THICKENUP CLEAR, senna-docusate, sodium chloride  Labs:  CMP     Component Value Date/Time   NA 141 08/22/2012 0645   K 4.8 08/22/2012 0645   CL 107 08/22/2012 0645   CO2 28 08/22/2012 0645   GLUCOSE 114* 08/22/2012 0645   BUN 34* 08/22/2012 0645   CREATININE 0.89 08/22/2012 0645   CALCIUM 9.2 08/22/2012 0645   PROT 5.5* 03/31/2009 2230   ALBUMIN 2.2* 03/31/2009 2230   AST 22 03/31/2009 2230   ALT 18 03/31/2009 2230   ALKPHOS 68 03/31/2009 2230   BILITOT 0.8 03/31/2009 2230   GFRNONAA 77* 08/22/2012 0645   GFRAA 90* 08/22/2012 0645     Intake/Output Summary (Last 24 hours) at 08/22/12 0915 Last data filed at 08/22/12 0735  Gross per 24 hour  Intake   2368 ml  Output   1150 ml  Net   1218 ml  BM 8/21  Weight Status:   116 lb - stable  Body mass index is 17.77 kg/(m^2). Underweight.  Re-estimated needs:  1400 - 1600 kcal, 65 - 75 grams protein  Nutrition Dx:  Swallowing difficulty r/t recent CVA AEB dysphagia and need for ST. Ongoing.  Goal:  Advance to appropriate diet per SLP/MD. Met. Pt to meet >/= 90% of their estimated nutrition needs. Met.  Monitor:  Weights, labs, PO intake, I/O's  Leonard Motto MS, RD, LDN Pager: [phone] After-hours pager: [phone]

## 2012-08-22 NOTE — Progress Notes (Signed)
Physical Therapy Treatment Patient Details Name: [name] MRN: [id number] DOB: 1927/11/11 Today's Date: 08/22/2012 Time: 4098-1191 PT Time Calculation (min): 27 min  PT Assessment / Plan / Recommendation Comments on Treatment Session  Pt's wife present & states pt much better today compared to last couple of days.  Pt communicating & feed himself breakfast this AM.  Pt very eager to participate.  Progressing with PT goals but cont's to require assistance for balance & safety.  Would cont to benefit from CIR to maximize strength, balance, independence with functional mobility.      Follow Up Recommendations  Inpatient Rehab;Supervision for mobility/OOB    Barriers to Discharge        Equipment Recommendations  Defer to next venue    Recommendations for Other Services Rehab consult  Frequency Min 4X/week   Plan Discharge plan remains appropriate    Precautions / Restrictions Precautions Precautions: Fall Restrictions Weight Bearing Restrictions: No       Mobility  Bed Mobility Bed Mobility: Supine to Sit;Sitting - Scoot to Edge of Bed Supine to Sit: 3: Mod assist;With rails;HOB flat Sitting - Scoot to Edge of Bed: 3: Mod assist Details for Bed Mobility Assistance: Assist to initiate movement of LE's, assist to bring shoulders/trunk to sitting upright, & use of draw pad to pivot & bring hips closer to EOB.  directional cues for use of UE's & technique to increase ease of transition.   Transfers Transfers: Sit to Stand;Stand to Sit Sit to Stand: 3: Mod assist;With upper extremity assist;From bed Stand to Sit: 4: Min assist;With upper extremity assist;With armrests;To chair/3-in-1 Details for Transfer Assistance: Cues for hand placement & technique.  Assist to achieve standing, anterior weight shifting of trunk over BOS, balance, & controlled descent.   Ambulation/Gait Ambulation/Gait Assistance: 4: Min assist (+2 for safety & equipment) Ambulation Distance (Feet): 80  Feet Assistive device: Rolling walker Ambulation/Gait Assistance Details: Assist for balance, RW management, & safety.  Cues for technique, safety with turns, tall posture, increased step/stride length, & sequencing.  Pt cont's to have slow shuffling gait but improves with distance.   Gait Pattern: Step-through pattern;Decreased stride length;Narrow base of support;Trunk flexed;Shuffle Gait velocity: decreased gait speed (increases with distance ) Stairs: No Wheelchair Mobility Wheelchair Mobility: No Modified Rankin (Stroke Patients Only) Pre-Morbid Rankin Score: No symptoms Modified Rankin: Moderately severe disability      PT Goals Acute Rehab PT Goals Time For Goal Achievement: 08/25/12 Potential to Achieve Goals: Fair PT Goal: Supine/Side to Sit - Progress: Progressing toward goal PT Goal: Sit to Stand - Progress: Progressing toward goal PT Goal: Stand to Sit - Progress: Progressing toward goal PT Goal: Ambulate - Progress: Progressing toward goal  Visit Information  Last PT Received On: 08/22/12 Assistance Needed: +2    Subjective Data  Patient Stated Goal: to go home s   Cognition  Overall Cognitive Status: Impaired Area of Impairment: Other (comment);Problem solving (increased time for processing information) Arousal/Alertness: Awake/alert Orientation Level: Appears intact for tasks assessed Behavior During Session: [facility] for tasks performed Problem Solving: Required significant cues for sequencing with bed mobility & ambulation.      Balance     End of Session PT - End of Session Equipment Utilized During Treatment: Gait belt Activity Tolerance: Patient tolerated treatment well Patient left: in chair;with call bell/phone within reach;with family/visitor present Nurse Communication: Mobility status    Verdell Face, [place] [phone] 08/22/2012

## 2012-08-22 NOTE — Progress Notes (Signed)
Occupational Therapy Treatment Patient Details Name: [name] MRN: [id number] DOB: 1927/08/16 Today's Date: 08/22/2012 Time: 7829-5621 OT Time Calculation (min): 19 min  OT Assessment / Plan / Recommendation Comments on Treatment Session Pt very motivated.  Knee pain limits him (5/10) but he did not want medication    Follow Up Recommendations  Inpatient Rehab    Barriers to Discharge       Equipment Recommendations  Defer to next venue    Recommendations for Other Services    Frequency Min 3X/week   Plan      Precautions / Restrictions Precautions Precautions: Fall Restrictions Weight Bearing Restrictions: No   Pertinent Vitals/Pain 5/10 bil knees when standing    ADL  Grooming: Performed;Wash/dry hands;Min guard Where Assessed - Grooming: Supported Copywriter, advertising: Simulated;Minimal assistance (chair to sink to chair) Equipment Used: Gait belt Transfers/Ambulation Related to ADLs: Min A to ambulate to sink.  Pt did lose balance once when standing at sink, attempting to throw paper towel in trash--needed assist to recover.    OT Diagnosis:    OT Problem List:   OT Treatment Interventions:     OT Goals ADL Goals Pt Will Perform Grooming: with supervision;Standing at sink ADL Goal: Grooming - Progress: Progressing toward goals Pt Will Transfer to Toilet: with supervision;Ambulation;Comfort height toilet ADL Goal: Toilet Transfer - Progress: Progressing toward goals  Visit Information  Last OT Received On: 08/22/12 Assistance Needed: +1    Subjective Data      Prior Functioning       Cognition  Overall Cognitive Status: Impaired Area of Impairment: Safety/judgement Arousal/Alertness: Awake/alert Orientation Level: Appears intact for tasks assessed Behavior During Session: [facility] for tasks performed Problem Solving: Required significant cues for sequencing with bed mobility & ambulation.      Mobility Bed Mobility Bed Mobility: Supine to  Sit;Sitting - Scoot to Edge of Bed Supine to Sit: 3: Mod assist;With rails;HOB flat Sitting - Scoot to Edge of Bed: 3: Mod assist Details for Bed Mobility Assistance: Assist to initiate movement of LE's, assist to bring shoulders/trunk to sitting upright, & use of draw pad to pivot & bring hips closer to EOB.  directional cues for use of UE's & technique to increase ease of transition.   Transfers Sit to Stand: 4: Min assist;With upper extremity assist;From chair/3-in-1 Stand to Sit: 4: Min assist;With upper extremity assist;With armrests;To chair/3-in-1 Details for Transfer Assistance: cues for posture.  Pt tends to flex trunk   Exercises    Balance Static Standing Balance Static Standing - Balance Support: Bilateral upper extremity supported Static Standing - Level of Assistance: 5: Stand by assistance  End of Session OT - End of Session Equipment Utilized During Treatment: Gait belt Activity Tolerance: Patient tolerated treatment well Patient left: in chair;with call bell/phone within reach  GO     Reagen Haberman 08/22/2012, 12:36 PM Marica Otter, OTR/L [phone] 08/22/2012

## 2012-08-22 NOTE — Progress Notes (Signed)
Clinical Social Worker staffed case with [employer].  CSW reviewed chart and met with pt and family at bedside.  CSW and intern introduced self and explained role.  Pt and family agreeable to intern's presence.  CSW reviewed dc planning.  Pt and family strongly expressed desire for CIR at dc but are agreeable to dc if needed. CSW spoke with CIR who feel positive about insurance approval for CIR.  CSW to continue to follow and assist as needed.     Angelia Mould, MSW, [place] [phone]

## 2012-08-22 NOTE — Progress Notes (Signed)
Seeking insurance approval for CIR. If approved, and, if ok medically for d/c to CIR, could admit CIR tomorrow-08/23/12. [phone]

## 2012-08-22 NOTE — PMR Pre-admission (Signed)
PMR Admission Coordinator Pre-Admission Assessment  Patient: [name] is an 78 y.o., male MRN: [id number] DOB: Jan 24, 1927 Height: 5\' 8"  (172.7 cm) Weight: 55.7 kg (122 lb 12.7 oz)  Insurance Information HMO: yes    PPO:       PCP:       IPA:       80/20:       OTHER:   PRIMARY: UHC [A[employer]] Medicare      Policy#: [id number]      Subscriber: pt CM Name:     Judeth Cornfield                       Phone#: [phone]                         Fax#:  [phone] Pre-Cert#: [id number] onsite CM will follow [name] at [phone]                                       Employer:  retired Benefits:  Phone #: [phone]     Name:  Jola Babinski 08/22/12 Eff. Date: 12/27/06     Deduct: none      Out of Pocket Max: $3900.00[met:$466.38]      Life Max: none CIR: $220.00/day2 days 1-7 then 100%      SNF: $50/day 1-20; $150/day 21-40; 0 41-100. Outpatient:[need preauth] each tx has copay even if same day     Co-Pay: $40/therapy Home Health:80%        Co-Pay: 20% DME: 80%     Co-Pay: 20% [over $1000.00 must have preauth] Providers: [facility] network       Emergency Contact Information Contact Information    Name Relation Home Work Mobile   [place] A Spouse [phone]  [phone]   [name], [name] [phone]  [phone]     Current Medical History  Patient Admitting Diagnosis: thrombotic right cva  History of Present Illness: [name] is a 78 y.o. right-handed male with history of atrophic fibrillation on chronic Coumadin therapy as well as history of TIA. Admitted a 2213 with slurred speech and altered mental status. MRI of the brain showed small nonhemorrhagic acute infarct extends from the posterior superior right lenticular nucleus and to the posterior right corona radiata. Remote infarcts and small vessel disease type changes left corona radiata and right thalamus infarct. MRA of the head with intracranial atherosclerotic type changes. INR on admission of 1.7. Followup cardiology services for  history of atrial fibrillation with rapid ventricular response. Patient currently maintained on low dose Cardizem as well as coreg. Echocardiogram recently completed as outpatient with his cardiologist with results pending. 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 Speech therapy followup for dysphagia and maintained on mechanical soft nectar thick liquid diet. Physical occupational therapy ongoing. 8/28 Atrial Fib with RVR, continue carvedilol and digoxin, cardizem increased today.  8/30 atrial fib rates mostly 90s this AM with occasional transient slowing of pulse with pauses  Chronic atrial fib - HR in the 90s but tele also shows transient bradycardia as well, so doubt we can continue to push rate control. (Has not yet had morning meds.) Would continue current regimen. INR subtherapeutic today, management per pharmacy. Pt was not bridged with heparin this admission in setting of stroke.Will not increase AV blocking drugs any further because of occasional slowing. Generally his heart rate is staying  above 80 and while eating this am heart rate 100. Okay to transfer him to CIR today from cardiac standpoint.  Total: 0     Past Medical History  Past Medical History  Diagnosis Date  . NEOPLASM, MALIGNANT, BLADDER 04/10/2009  . HYPERLIPIDEMIA 04/10/2009  . ANEMIA-NOS 04/10/2009  . 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 ANXIETY 04/10/2009  . HYPERTENSION 04/10/2009  . Atrial fibrillation 04/10/2009  . COPD 04/10/2009  . TRANSIENT ISCHEMIC ATTACK, HX OF 04/10/2009  . Bladder cancer     Dr. Darvin Neighbours  . Lacunar stroke 04/07/2011  . Pleural effusion 04/07/2011  . 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  Prior Rehab/Hospitalizations: none  Current Medications  Current facility-administered medications:0.9 %  sodium chloride infusion, 250 mL, Intravenous, PRN,  Belkys A Regalado, MD;  0.9 %  sodium chloride infusion, , Intravenous, Continuous, Lonia Blood, MD, Last Rate: 75 mL/hr at 08/25/12 0853, 1,000 mL at 08/25/12 0853;  acetaminophen (TYLENOL) tablet 650 mg, 650 mg, Oral, Q6H PRN, Belkys A Regalado, MD, 650 mg at 08/21/12 0931 bisacodyl (DULCOLAX) suppository 10 mg, 10 mg, Rectal, Once, Belkys A Regalado, MD;  carvedilol (COREG) tablet 3.125 mg, 3.125 mg, Oral, BID WC, Cassell Clement, MD, 3.125 mg at 08/25/12 9604;  colchicine tablet 0.6 mg, 0.6 mg, Oral, Daily, Belkys A Regalado, MD, 0.6 mg at 08/25/12 5409;  digoxin (LANOXIN) tablet 0.125 mg, 0.125 mg, Oral, Daily, Cassell Clement, MD, 0.125 mg at 08/25/12 8119 diltiazem (CARDIZEM CD) 24 hr capsule 240 mg, 240 mg, Oral, Daily, Cassell Clement, MD, 240 mg at 08/25/12 1478;  docusate sodium (COLACE) capsule 100 mg, 100 mg, Oral, BID, Belkys A Regalado, MD, 100 mg at 08/25/12 0958;  polyethylene glycol (MIRALAX / GLYCOLAX) packet 17 g, 17 g, Oral, BID, Belkys A Regalado, MD, 17 g at 08/24/12 2110;  RESOURCE THICKENUP CLEAR, , Oral, PRN, Belkys A Regalado, MD Rivaroxaban (XARELTO) tablet TABS 15 mg, 15 mg, Oral, Daily, Calvert Cantor, MD;  senna-docusate (Senokot-S) tablet 1 tablet, 1 tablet, Oral, QHS PRN, Belkys A Regalado, MD, 1 tablet at 08/23/12 1026;  sodium chloride 0.9 % injection 3 mL, 3 mL, Intravenous, Q12H, Belkys A Regalado, MD, 3 mL at 08/24/12 2112;  sodium chloride 0.9 % injection 3 mL, 3 mL, Intravenous, PRN, Belkys A Regalado, MD warfarin (COUMADIN) tablet 2.5 mg, 2.5 mg, Oral, ONCE-1800, Lonia Blood, MD, 2.5 mg at 08/24/12 1754;  DISCONTD: cefTRIAXone (ROCEPHIN) 1 g in dextrose 5 % 50 mL IVPB, 1 g, Intravenous, Q24H, Belkys A Regalado, MD, 1 g at 08/24/12 1218;  DISCONTD: warfarin (COUMADIN) tablet 5 mg, 5 mg, Oral, ONCE-1800, Calvert Cantor, MD;  DISCONTD: Warfarin - Pharmacist Dosing Inpatient, , Does not apply, q1800, Hollice Espy, MD  Patients Current Diet: Dysphagia nectar  thick liquids Patient with very specific food preferences pta. Due to his past medical history of bladder cancer, he eats specific foods. Peppers, few meats, vegetables, etc. Wife may ask to bring in foods from home. P.A. Is aware  Precautions / Restrictions Precautions Precautions: Fall Restrictions Weight Bearing Restrictions: No   Prior Activity Level Community (5-7x/wk):  (very independent & active PTA) Patient very active pta. 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  Home Assistive Devices / Equipment Home Assistive Devices/Equipment: None Home Adaptive Equipment: None  Prior Functional Level Prior Function Level of Independence: Independent Able to  Take Stairs?: Yes Driving: Yes Vocation: Retired  Current Functional Level Cognition  Arousal/Alertness: Awake/alert Overall Cognitive Status: Impaired Overall Cognitive Status: Impaired Current Attention Level: Sustained Attention - Other Comments: pt tends to use humor to cover up cognitive deficits Memory: Decreased recall of precautions Memory Deficits: pt demonstrates poor recall of events Orientation Level: Oriented X4 Safety/Judgement: Decreased awareness of safety precautions;Decreased safety judgement for tasks assessed Cognition - Other Comments: pt requiring increased time to follow commands and is often slow to respond to verbal comments and questions Attention: Focused;Sustained;Selective Focused Attention: Appears intact Sustained Attention: Appears intact Selective Attention: Impaired Selective Attention Impairment: Verbal complex;Functional complex Memory: Impaired Memory Impairment: Decreased short term memory Decreased Short Term Memory: Verbal complex Awareness: Impaired Awareness Impairment: Intellectual impairment;Emergent impairment;Anticipatory impairment Problem Solving: Impaired Problem Solving Impairment: Functional basic;Verbal  basic Executive Function: Reasoning;Sequencing;Self Monitoring;Self Correcting Reasoning: Impaired Reasoning Impairment: Verbal complex;Functional basic Sequencing: Impaired Sequencing Impairment: Verbal complex Self Monitoring: Impaired Self Monitoring Impairment: Verbal complex;Functional basic Self Correcting: Impaired Self Correcting Impairment: Verbal complex;Functional basic Safety/Judgment: Impaired    Extremity Assessment (includes Sensation/Coordination)  RUE ROM/Strength/Tone: Within functional levels  RLE ROM/Strength/Tone: Within functional levels RLE Sensation: WFL - Light Touch    ADLs  Grooming: Performed;Wash/dry hands;Min guard Where Assessed - Grooming: Supported standing Lower Body Dressing: Performed;Supervision/safety Where Assessed - Lower Body Dressing: Unsupported sitting Toilet Transfer: Simulated;Minimal assistance (chair to sink to chair) Toilet Transfer: Patient Percentage: 70% Toilet Transfer Method: Sit to Barista: Comfort height toilet;Grab bars Toileting - Clothing Manipulation and Hygiene: Performed;Moderate assistance Where Assessed - Toileting Clothing Manipulation and Hygiene: Sit to stand from 3-in-1 or toilet Equipment Used: Gait belt Transfers/Ambulation Related to ADLs: Min A to ambulate to sink.  Pt did lose balance once when standing at sink, attempting to throw paper towel in trash--needed assist to recover. ADL Comments: Pt donned bil socks sitting EOB.  Pt requesting to walk to bathroom to attempt having BM on commode.      Mobility  Bed Mobility: Not assessed Supine to Sit: 5: Supervision;With rails Sitting - Scoot to Edge of Bed: 5: Supervision;With rail Sit to Supine: 3: Mod assist;HOB flat    Transfers  Transfers: Sit to Stand;Stand to Sit Sit to Stand: 4: Min guard;With upper extremity assist;With armrests;From chair/3-in-1 Stand to Sit: 4: Min guard;With upper extremity assist;With armrests;To  chair/3-in-1    Ambulation / Gait / Stairs / Psychologist, prison and probation services  Ambulation/Gait Ambulation/Gait Assistance: 4: Min guard Ambulation/Gait: Patient Percentage: 70% Ambulation Distance (Feet): 200 Feet Assistive device: Rolling walker Ambulation/Gait Assistance Details: Cont's to require cues for tall posture, looking up, increased step/stride length ~75% of the time.  Corrects with cues but only maintains for ~1-2 seconds.   Gait Pattern: Decreased stride length;Step-through pattern;Trunk flexed Gait velocity: decreased gait speed Stairs: No Wheelchair Mobility Wheelchair Mobility: No    Posture / Balance Static Sitting Balance Static Sitting - Balance Support: No upper extremity supported;Feet supported Static Sitting - Level of Assistance: 5: Stand by assistance Static Sitting - Comment/# of Minutes: ~ 5 min Dynamic Sitting Balance Dynamic Sitting - Balance Support: During functional activity;No upper extremity supported;Feet supported Dynamic Sitting - Level of Assistance: 5: Stand by assistance Dynamic Sitting Balance - Compensations: donned socks sitting EOB Static Standing Balance Static Standing - Balance Support: No upper extremity supported Static Standing - Level of Assistance: 5: Stand by assistance Static Standing - Comment/# of Minutes: able to stand ~2 minutes with eyes open/closed, feet shoulders width apart.  Min  Assist for balance with external perturbations at hips in all directions.       Previous Home Environment Living Arrangements: Spouse/significant other Lives With: Spouse Available Help at Discharge: Family;Available 24 hours/day Type of Home: House Home Layout: One level Home Access: Stairs to enter Entrance Stairs-Rails:  (wife says she can have rail placed) Entrance Stairs-Number of Steps: 4 Bathroom Shower/Tub: Engineer, manufacturing systems: Standard Bathroom Accessibility: Yes How Accessible: Accessible via walker Home Care Services: Other  (Comment)  Discharge Living Setting Plans for Discharge Living Setting: Patient's home Type of Home at Discharge: House Discharge Home Layout: One level Discharge Home Access: Stairs to enter Entrance Stairs-Rails: None (wife says will have rail placed) Entrance Stairs-Number of Steps:  (4 STE from garage) Discharge Bathroom Shower/Tub: Tub/shower unit Discharge Bathroom Toilet: Standard Discharge Bathroom Accessibility: Yes How Accessible: Accessible via walker Do you have any problems obtaining your medications?: No  Social/Family/Support Systems Patient Roles: Spouse;Parent Contact Information:  ((h) [phone]) Anticipated Caregiver:  (wife) Anticipated Caregiver's Contact Information:  (cell [phone]) Ability/Limitations of Caregiver:  (supervision-light min assist) Caregiver Availability: 24/7 Discharge Plan Discussed with Primary Caregiver: Yes Is Caregiver In Agreement with Plan?: Yes Does Caregiver/Family have Issues with Lodging/Transportation while Pt is in Rehab?: No  Goals/Additional Needs Patient/Family Goal for Rehab:  (Modified I-S) Expected length of stay:  (2-3 weeks) Dietary Needs:  (D3  nectar liquids) Pt/Family Agrees to Admission and willing to participate: Yes Program Orientation Provided & Reviewed with Pt/Caregiver Including Roles  & Responsibilities: Yes  Patient Condition: Please see physician update to information in consult dated 08/21/12.  Preadmission Screen Completed By:  Clois Dupes, 08/25/2012 11:12 AM ______________________________________________________________________   Discussed status with Dr. Riley Kill on 08/25/12 at 0830 and received telephone approval for admission today.  Admission Coordinator:  Clois Dupes, time 2956 Date 08/25/12.

## 2012-08-22 NOTE — Progress Notes (Signed)
Stroke Team Progress Note  HISTORY Ishmail Mcmanamon is an 78 y.o. male who developed slurred speech during breakfast with his wife around 7am 08/17/2012. He went to bed feeling "normal", then woke up to urinate "just after midnight". He says that he awoke fine, but developed above problem. Denies any weakness in extremities or with thought processes. Wife is present.  He does state that he has been in afib chronically and is on coumadin managed by the [facility] in [facility]. In the ER he was not rate controlled and was placed on Cardizem.  Patient was not a TPA candidate secondary to unknown time of symptom onset. He was admitted to the step down unit for further evaluation and treatment.  SUBJECTIVE Wife at bedside. Attending concerned with lethargy worsening yesterday. More alert this am, but not back to how he was on Fri.  OBJECTIVE Most recent Vital Signs: Filed Vitals:   08/22/12 0500 08/22/12 0600 08/22/12 0700 08/22/12 0734  BP: 92/57 106/51 101/61 104/55  Pulse: 102 92 84   Temp:    98 F (36.7 C)  TempSrc:    Oral  Resp: 20 11 17    Height:      Weight:      SpO2: 93% 98% 98%    Telemetry:  Afib rate 130s  Intake/Output from previous day: 08/26 0701 - 08/27 0700 In: 2368 [P.O.:240; I.V.:1578; IV Piggyback:550] Out: 1150 [Urine:1150]  IV Fluid Intake:      . sodium chloride 75 mL/hr at 08/22/12 0300   MEDICATIONS     . carvedilol  3.125 mg Oral BID WC  . cefTRIAXone (ROCEPHIN)  IV  1 g Intravenous Q24H  . colchicine  0.6 mg Oral Daily  . digoxin  0.125 mg Oral Daily  . digoxin  0.25 mg Oral BID  . diltiazem  120 mg Oral Daily  . docusate sodium  100 mg Oral BID  . polyethylene glycol  17 g Oral BID  . sodium chloride  500 mL Intravenous Once  . sodium chloride  3 mL Intravenous Q12H  . warfarin  2.5 mg Oral ONCE-1800  . Warfarin - Pharmacist Dosing Inpatient   Does not apply q1800  . DISCONTD: predniSONE  40 mg Oral Q breakfast   PRN:  sodium chloride,  acetaminophen, RESOURCE THICKENUP CLEAR, senna-docusate, sodium chloride  Diet:  Dysphagia 3 nectar thick liquids Activity:  Up with assistance DVT Prophylaxis:  warfarin  CLINICALLY SIGNIFICANT STUDIES Basic Metabolic Panel:   Lab 08/22/12 0645 08/21/12 0420  NA 141 134*  K 4.8 4.3  CL 107 98  CO2 28 30  GLUCOSE 114* 129*  BUN 34* 34*  CREATININE 0.89 1.15  CALCIUM 9.2 9.4  MG -- --  PHOS -- --   CBC:   Lab 08/22/12 0645 08/21/12 1043 08/17/12 1121  WBC 13.1* 10.8* --  NEUTROABS -- -- 4.0  HGB 12.2* 12.6* --  HCT 36.1* 36.9* --  MCV 98.6 97.9 --  PLT 139* 146* --   Coagulation:   Lab 08/22/12 0645 08/21/12 0420 08/20/12 0840 08/19/12 0935  LABPROT 36.9* 34.2* 38.1* 34.1*  INR 3.66* 3.32* 3.81* 3.31*   Urinalysis:   Lab 08/21/12 1002 08/17/12 1630  COLORURINE YELLOW YELLOW  LABSPEC 1.019 1.011  PHURINE 6.0 7.0  GLUCOSEU NEGATIVE NEGATIVE  HGBUR LARGE* TRACE*  BILIRUBINUR NEGATIVE NEGATIVE  KETONESUR NEGATIVE NEGATIVE  PROTEINUR 100* NEGATIVE  UROBILINOGEN 1.0 0.2  NITRITE NEGATIVE NEGATIVE  LEUKOCYTESUR LARGE* TRACE*   Lipid Panel    Component  Value Date/Time   CHOL 165 08/18/2012 0350   TRIG 57 08/18/2012 0350   HDL 58 08/18/2012 0350   CHOLHDL 2.8 08/18/2012 0350   VLDL 11 08/18/2012 0350   LDLCALC 96 08/18/2012 0350   HgbA1C  Lab Results  Component Value Date   HGBA1C 6.0* 08/18/2012    CT of the brain 08/20/2012 1. Evolutionary change in the lacunar type right basal ganglia infarct. 2. No findings for hemispheric infarction or intracranial hemorrhage. 3. Stable atrophy and periventricular white matter disease.  08/17/2012   1. No acute intracranial abnormality. 2.  Chronic small vessel ischemia with progression since 2012.     MRI of the brain  08/17/2012 Small non hemorrhagic acute infarct extends from the posterior superior right lenticular nucleus into the posterior right corona radiata. Remote infarcts and small vessel disease type changes  MRA  of the brain  08/17/2012  Intracranial atherosclerotic type changes.  This is most notable involving branch vessels.    L knee xray negative for fx, other   CXR No active disease.  EKG  atrial fibrillation, rate 130.   Therapy Recommendations : PT: Pt with decreased balance, functional mobility and safety. Inpatient Rehab;Supervision for mobility/OOB  OT: Recommending CIR to further maximize independence and safety with ADLs before d/c home ST: mild oral dyspahgia with mild oral residual due to left labial and lingual weakness. More signficant oropharyngeal dysphagia with sensory/motor deficits leading to a delay in swallow initiaton and silent aspriaiton of thin liquids before the swallow. Furthermore weakness of base of tongue and hyolaryngeal complex results in mild to moderate residual that necesitates verbal cues for a second swallow to clear. Pt is a moderate aspiration risk, reduced with nectar thick liquids and a mechanical soft diet with a second swallow and intermittent throat clear. Pt will need full supervision to follow given mild cognitive deficits.   Physical Exam   Sitting on commode.   NAD.  Kyphotic Neurological Exam  Mental Status: Alert, oriented, thought content appropriate.  Speech fluent without evidence of aphasia. Able to follow 3 step commands without difficulty. Cranial Nerves: II-Visual fields grossly intact. III/IV/VI-Extraocular movements intact.  Pupils reactive bilaterally. V/VII-Smile symmetric VIII-grossly intact IX/X-not asssessed XI-bilateral shoulder shrug intact XII-midline tongue extension Motor:   Left grip slightly weaker than right. Fixation of left arm on rapid orbiting, mild left leg drift. Sensory: light touch intact throughout, bilaterally Deep Tendon Reflexes: 2+ and symmetric throughout Plantars: Downgoing bilaterally Cerebellar: Normal finger-to-nose.  Orbits Right around left hand. Gait: need assistance to get up from seated position,  cautious, mild unsteady,  ASSESSMENT Mr. Rosendo Couser is a 78 y.o. male presenting with small non hemorrhagic acute infarct extending from the posterior superior right lenticular nucleus into the posterior right corona radiata. Patient with resultant slurred speech,dysphagia, no notable weakness. Infarct felt to be embolic secondary to atrial fibrillation.  On warfarin prior to admission. INR 1.70 on arrival. Continue warfarin for secondary stroke prevention. INR now therapeutic.   -carotid Doppler not indicated as patient not a surgical candidate -2-D echocardiogram not indicated as patient already on warfarin  New lethargy 8/35 not stroke related. CT unremarkable. Symptoms likely metabolic.  -atrial fibrillation. On po cardizem.  -recurrent bladder cancer -hyperlipidemia, LDL 96 -hypertension  A1C 6.0  Hospital day # 5  TREATMENT/PLAN -continue warfarin for secondary stroke prevention -agree with plans for rehab -Stroke Service will sign off. Follow up with Dr. Pearlean Brownie, Stroke Clinic, in 2 months.   SHARON BIBY, MSN, RN, ANVP-BC,  ANP-BC, GNP-BC [facility] Pager: [phone] 08/22/2012 8:40 AM  Scribe for Dr. Delia Heady, Stroke Center Medical Director, who has personally reviewed chart, pertinent data, examined the patient and developed the plan of care. Pager:  [phone]

## 2012-08-22 NOTE — Progress Notes (Signed)
Subjective:  Patient had a good night.  No chest pain or dyspnea. Heart rate has improved down into the 90s this am BP remains soft which he states is not unusual for him. Recent echo is in shadow chart. Shows EF 45-50 %. Mild MR, moderate TR, RA is markedly enlarged.Moderate-to-severe PI. Objective:  Vital Signs in the last 24 hours: Temp:  [97.3 F (36.3 C)-98.1 F (36.7 C)] 98 F (36.7 C) (08/27 0734) Pulse Rate:  [49-134] 84  (08/27 0700) Resp:  [11-25] 17  (08/27 0700) BP: (86-114)/(49-76) 104/55 mmHg (08/27 0734) SpO2:  [92 %-100 %] 98 % (08/27 0700) Weight:  [116 lb 13.5 oz (53 kg)] 116 lb 13.5 oz (53 kg) (08/27 0430)  Intake/Output from previous day: 08/26 0701 - 08/27 0700 In: 2368 [P.O.:240; I.V.:1578; IV Piggyback:550] Out: 1150 [Urine:1150] Intake/Output from this shift: Total I/O In: -  Out: 200 [Urine:200]     . carvedilol  3.125 mg Oral BID WC  . cefTRIAXone (ROCEPHIN)  IV  1 g Intravenous Q24H  . colchicine  0.6 mg Oral Daily  . digoxin  0.25 mg Oral BID  . diltiazem  120 mg Oral Daily  . docusate sodium  100 mg Oral BID  . polyethylene glycol  17 g Oral BID  . sodium chloride  500 mL Intravenous Once  . sodium chloride  3 mL Intravenous Q12H  . warfarin  2.5 mg Oral ONCE-1800  . Warfarin - Pharmacist Dosing Inpatient   Does not apply q1800  . DISCONTD: carvedilol  6.25 mg Oral BID WC  . DISCONTD: digoxin  0.25 mg Oral Daily  . DISCONTD: predniSONE  40 mg Oral Q breakfast      . sodium chloride 75 mL/hr at 08/22/12 0300    Physical Exam: The patient appears to be in no distress.  Head and neck exam reveals that the pupils are equal and reactive. The extraocular movements are full. There is no scleral icterus. Mouth and pharynx are benign. No lymphadenopathy. No carotid bruits. The jugular venous pressure is normal. Thyroid is not enlarged or tender.  Chest is clear to percussion and auscultation. No rales or rhonchi. Expansion of the chest is  symmetrical.  Heart reveals no abnormal lift or heave. First and second heart sounds are normal. There is no murmur gallop rub or click. The pulse is 95 and irregularly irregular. The abdomen is soft and nontender. Bowel sounds are normoactive. There is no hepatosplenomegaly or mass. There are no abdominal bruits.  Extremities reveal no phlebitis or edema. Pedal pulses are good. There is no cyanosis or clubbing.  Neurologic exam reveals patient is alert and cooperative.   Lab Results:  Basename 08/22/12 0645 08/21/12 1043  WBC 13.1* 10.8*  HGB 12.2* 12.6*  PLT 139* 146*    Basename 08/22/12 0645 08/21/12 0420  NA 141 134*  K 4.8 4.3  CL 107 98  CO2 28 30  GLUCOSE 114* 129*  BUN 34* 34*  CREATININE 0.89 1.15   No results found for this basename: TROPONINI:2,CK,MB:2 in the last 72 hours Hepatic Function Panel No results found for this basename: PROT,ALBUMIN,AST,ALT,ALKPHOS,BILITOT,BILIDIR,IBILI in the last 72 hours No results found for this basename: CHOL in the last 72 hours No results found for this basename: PROTIME in the last 72 hours  Imaging: Ct Head Wo Contrast  08/20/2012  *RADIOLOGY REPORT*  Clinical Data: Mental status change.  CT HEAD WITHOUT CONTRAST  Technique:  Contiguous axial images were obtained from the base of  the skull through the vertex without contrast.  Comparison: Head CT 08/17/2012 and brain MRI 08/17/2012.  Findings: Stable age related cerebral atrophy and periventricular white matter disease.  Evolutionary change in the right basal ganglia infarct.  No new/acute hemispheric infarction and/or intracranial hemorrhage.  No extra-axial fluid collections.  The brainstem and cerebellum are grossly normal and stable.  The calvarium is intact and appears stable.  The paranasal sinuses and mastoid air cells are clear.  IMPRESSION:  1.  Evolutionary change in the lacunar type right basal ganglia infarct. 2.  No findings for hemispheric infarction or intracranial  hemorrhage. 3.  Stable atrophy and periventricular white matter disease.   Original Report Authenticated By: P. Loralie Champagne, M.D.    Dg Chest Port 1 View  08/21/2012  *RADIOLOGY REPORT*  Clinical Data: Stroke.  PORTABLE CHEST - 1 VIEW  Comparison: 03/30/2011  Findings: The heart size and mediastinal contours are within normal limits.  Both lungs are clear.  The visualized skeletal structures are unremarkable.  IMPRESSION: No active disease.   Original Report Authenticated By: Rosealee Albee, M.D.    Dg Knee Left Port  08/21/2012  *RADIOLOGY REPORT*  Clinical Data: Pain without trauma  PORTABLE LEFT KNEE - 1-2 VIEW  Comparison: None.  Findings: There is extensive chondrocalcinosis.  Small marginal spurs about all three compartments of the knee.  Negative for fracture, dislocation, or other acute bony abnormality.  Small effusion in the suprapatellar bursa.  Normal mineralization and alignment.  IMPRESSION:  1.  Negative for fracture or other acute abnormality. 2. 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   Original Report Authenticated By: Osa Craver, M.D.     Cardiac Studies: Echo results in shadow chart. Assessment/Plan:  Patient Active Hospital Problem List: 1. chronic established atrial fibrillation on long-term Coumadin,  with improved  ventricular response.  2. right brain embolic stroke secondary to atrial fibrillation  3. past history of hypertension but  blood pressure has been soft.  Plan: Continue carvedilol  3.125 mg BID         Continue low dose digoxin 0.125 mg daily for addition rate control.          Continue Diltiazem CD 120 which he was on at home.  Okay to increase activity from cardiac standpoint.  LOS: 5 days    Cassell Clement 08/22/2012, 8:15 AM

## 2012-08-22 NOTE — Progress Notes (Signed)
Subjective: Patient awake, almost back to baseline per wife. He was able to brush his teeth by himself, eat breakfast. He wants top go home. Able to move left knee without significant pain.   Objective: Filed Vitals:   08/22/12 0600 08/22/12 0700 08/22/12 0734 08/22/12 0858  BP: 106/51 101/61 104/55 98/61  Pulse: 92 84  95  Temp:   98 F (36.7 C)   TempSrc:   Oral   Resp: 11 17    Height:      Weight:      SpO2: 98% 98%     Weight change: 0.9 kg (1 lb 15.8 oz)   General: Alert, awake, oriented x3, in no acute distress.  HEENT: No bruits, no goiter.  Heart: Regular rate and rhythm, without murmurs, rubs, gallops.  Lungs: CTA, bilateral air movement.  Abdomen: Soft, nontender, nondistended, positive bowel sounds.  Neuro: awake following command,move all 4 extremities Extremities; no edema. Left knee no significant swelling no redness.   Lab Results:  [facility] 08/22/12 0645 08/21/12 0420  NA 141 134*  K 4.8 4.3  CL 107 98  CO2 28 30  GLUCOSE 114* 129*  BUN 34* 34*  CREATININE 0.89 1.15  CALCIUM 9.2 9.4  MG -- --  PHOS -- --    Basename 08/22/12 0645 08/21/12 1043  WBC 13.1* 10.8*  NEUTROABS -- --  HGB 12.2* 12.6*  HCT 36.1* 36.9*  MCV 98.6 97.9  PLT 139* 146*   Micro Results: Recent Results (from the past 240 hour(s))  MRSA PCR SCREENING     Status: Normal   Collection Time   08/17/12  5:30 PM      Component Value Range Status Comment   MRSA by PCR NEGATIVE  NEGATIVE Final     Studies/Results: Ct Head Wo Contrast  08/20/2012  *RADIOLOGY REPORT*  Clinical Data: Mental status change.  CT HEAD WITHOUT CONTRAST  Technique:  Contiguous axial images were obtained from the base of the skull through the vertex without contrast.  Comparison: Head CT 08/17/2012 and brain MRI 08/17/2012.  Findings: Stable age related cerebral atrophy and periventricular white matter disease.  Evolutionary change in the right basal ganglia infarct.  No new/acute hemispheric infarction  and/or intracranial hemorrhage.  No extra-axial fluid collections.  The brainstem and cerebellum are grossly normal and stable.  The calvarium is intact and appears stable.  The paranasal sinuses and mastoid air cells are clear.  IMPRESSION:  1.  Evolutionary change in the lacunar type right basal ganglia infarct. 2.  No findings for hemispheric infarction or intracranial hemorrhage. 3.  Stable atrophy and periventricular white matter disease.   Original Report Authenticated By: P. Loralie Champagne, M.D.    Dg Chest Port 1 View  08/21/2012  *RADIOLOGY REPORT*  Clinical Data: Stroke.  PORTABLE CHEST - 1 VIEW  Comparison: 03/30/2011  Findings: The heart size and mediastinal contours are within normal limits.  Both lungs are clear.  The visualized skeletal structures are unremarkable.  IMPRESSION: No active disease.   Original Report Authenticated By: Rosealee Albee, M.D.    Dg Knee Left Port  08/21/2012  *RADIOLOGY REPORT*  Clinical Data: Pain without trauma  PORTABLE LEFT KNEE - 1-2 VIEW  Comparison: None.  Findings: There is extensive chondrocalcinosis.  Small marginal spurs about all three compartments of the knee.  Negative for fracture, dislocation, or other acute bony abnormality.  Small effusion in the suprapatellar bursa.  Normal mineralization and alignment.  IMPRESSION:  1.  Negative for fracture  or other acute abnormality. 2. 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   Original Report Authenticated By: Osa Craver, M.D.     Medications: I have reviewed the patient's current medications.  78 year old with PMH significant for A fib on chronic coumadin, prior history of stroke, ho bladder cancer, who presents to ED accompanied by wife due to slurred speech, drooling and episode of incontinence. Found to have acute stroke, Afib RVR. Patient was initially on Cardizem Gtt this was transition to oral Cardizem. His HR was not controlled for this reason low  dose coreg was added. Hr rate was not controlled on Cardizem and coreg, and due to concern of decreasing further BP in setting of acute stroke cardiology was consulted. Patient was started on Digoxin. Hospital course complicated by delirium, metabolic encephalopathy likely related to UTI, hospital delirium.    1- CVA (cerebral vascular accident)-Acute right small nonhemorrhagic infarct seen on MRI:  -MRI show: Small non hemorrhagic acute infarct extends from the posterior superior right lenticular nucleus into the posterior right corona radiata.  -Patient was admitted to step down unit due to A fib RVR.  -Continue with Coumadin per pharmacy.  -His recurrent stroke is likely related to A fib, no need for ECHO or Carotid doppler.  -LDL 96, and HB-A 1c 6.0.  -Episode of confusion probably related to delirium, metabolic encephalopathy, UTI. -NR therapeutic at 3.  -Inpatient rehab evaluation.   2-Atrial fibrillation with RVR:  Transition Cardizem IV to oral 8-24 . Patient started on Coreg 6.25 BID 8-24. Started digoxin 8-25. Cardiology helping with management.  Continue with Coumadin.  INR at 3. 6  3-HYPERLIPIDEMIA: LDL 96.   4-Encephalopathy/AMS: Probably delirium in setting of acute stroke, hospitalization, UTI.  Repeated CT head 8-25 no acute stroke or bleeding. Chest x ray 8-26 negative for infection. UA with to numerous to count WBC. Patient started on ceftriaxone. Patient now awake, following command, improved mentation, almost back to baseline per wife.   5-Left knee pain: Pseudogout. Left knee x ray: Negative for fracture or other acute abnormality. 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 Patient received 1 dose prednisone 8-26. Started on colchicine 0.6 on 8-26. Pain improved.   6-Mild uremia: IV fluids, will monitor for volume overload. Change IV fluids to D-5 half NS, sodium increase 141.  7-Leukocytosis: In setting UTI. 1 dose of prednisone.  Follow trend.  8-UTI: UA with too numerous to count WBC. Continue with Ceftriaxone day 2. Follow up urine culture.  9-Constipation: Passing gas. Continue with docusate, miralax. Dulcolax suppository if no BM today.   ANEMIA-NOS  HYPERTENSION  COPD  Post-splenectomy  Long term current use of anticoagulant   Disposition : Observed step down unit. Follow urine culture. Work with PT today.      LOS: 5 days   Navi Ewton M.D.  [facility] 08/22/2012, 10:04 AM

## 2012-08-22 NOTE — Progress Notes (Signed)
ANTICOAGULATION CONSULT NOTE - Follow up Consult  Pharmacy Consult for Coumadin Indication: atrial fibrillation and stroke  Vital Signs: Temp: 98 F (36.7 C) (08/27 0734) Temp src: Oral (08/27 0734) BP: 104/55 mmHg (08/27 0734) Pulse Rate: 84  (08/27 0700)  Labs:  Basename 08/22/12 0645 08/21/12 1043 08/21/12 0420 08/20/12 0840  HGB 12.2* 12.6* -- --  HCT 36.1* 36.9* -- --  PLT 139* 146* -- --  APTT -- -- -- --  LABPROT 36.9* -- 34.2* 38.1*  INR 3.66* -- 3.32* 3.81*  HEPARINUNFRC -- -- -- --  CREATININE 0.89 -- 1.15 --  CKTOTAL -- -- -- --  CKMB -- -- -- --  TROPONINI -- -- -- --    Assessment: 78 yo male patient admitted with acute non-hemorrhagic stroke, on chronic coumadin for h/o stroke/afib. INR up to 3.66 today.   Will hold Coumadin today and reassess in AM. Per pt takes 2.5 mg every day except 5 mg on Mondays (med rec updated). CBC has been stable; no bleeding reported per RN.   Goal of Therapy:  INR 2-3 Monitor platelets by anticoagulation protocol: Yes   Plan:  No Coumadin today.  Continue daily INR checks.  Celedonio Miyamoto, PharmD, BCPS Clinical Pharmacist Pager [phone]  08/22/2012,8:02 AM

## 2012-08-23 LAB — PROTIME-INR
INR: 3.46 — ABNORMAL HIGH (ref 0.00–1.49)
Prothrombin Time: 35.3 seconds — ABNORMAL HIGH (ref 11.6–15.2)

## 2012-08-23 LAB — BASIC METABOLIC PANEL
BUN: 36 mg/dL — ABNORMAL HIGH (ref 6–23)
Calcium: 9.2 mg/dL (ref 8.4–10.5)
GFR calc Af Amer: 78 mL/min — ABNORMAL LOW (ref >90)
GFR calc non Af Amer: 67 mL/min — ABNORMAL LOW (ref >90)
Potassium: 4.3 mEq/L (ref 3.5–5.1)
Sodium: 135 mEq/L (ref 135–145)

## 2012-08-23 LAB — CBC
MCHC: 33.4 g/dL (ref 30.0–36.0)
RDW: 14.3 % (ref 11.5–15.5)

## 2012-08-23 MED ORDER — DILTIAZEM HCL ER COATED BEADS 180 MG PO CP24
180.0000 mg | ORAL_CAPSULE | Freq: Every day | ORAL | Status: DC
  Administered 2012-08-23: 180 mg via ORAL
  Filled 2012-08-23 (×2): qty 1

## 2012-08-23 MED ORDER — SODIUM CHLORIDE 0.9 % IV SOLN
INTRAVENOUS | Status: DC
  Administered 2012-08-23 – 2012-08-24 (×2): via INTRAVENOUS
  Administered 2012-08-25: 1000 mL via INTRAVENOUS

## 2012-08-23 NOTE — Progress Notes (Signed)
ANTICOAGULATION CONSULT NOTE - Follow up Consult  Pharmacy Consult for Coumadin Indication: atrial fibrillation and stroke  Vital Signs: Temp: 98.8 F (37.1 C) (08/28 0741) Temp src: Oral (08/28 0741) BP: 109/71 mmHg (08/28 0837) Pulse Rate: 109  (08/28 0837)  Labs:  Basename 08/23/12 0321 08/22/12 0645 08/21/12 1043 08/21/12 0420  HGB 12.0* 12.2* -- --  HCT 35.9* 36.1* 36.9* --  PLT 171 139* 146* --  APTT -- -- -- --  LABPROT 35.3* 36.9* -- 34.2*  INR 3.46* 3.66* -- 3.32*  HEPARINUNFRC -- -- -- --  CREATININE 1.01 0.89 -- 1.15  CKTOTAL -- -- -- --  CKMB -- -- -- --  TROPONINI -- -- -- --    Assessment: 78 yo male patient admitted with acute non-hemorrhagic stroke, on chronic coumadin for h/o stroke/afib. INR 3.46 today.   Will hold Coumadin today and reassess in AM. Per pt takes 2.5 mg every day except 5 mg on Mondays (med rec updated). CBC has been stable; no bleeding reported per RN.   Goal of Therapy:  INR 2-3 Monitor platelets by anticoagulation protocol: Yes   Plan:  No Coumadin today.  Continue daily INR checks.  Celedonio Miyamoto, PharmD, BCPS Clinical Pharmacist Pager [phone]  08/23/2012,8:46 AM

## 2012-08-23 NOTE — Progress Notes (Signed)
Subjective:  Patient had a good night.  No chest pain or dyspnea. Heart rate is still running a little high.  Patient walked in hall yesterday. Recent echo is in shadow chart. Shows EF 45-50 %. Mild MR, moderate TR, RA is markedly enlarged.Moderate-to-severe PI. Objective:  Vital Signs in the last 24 hours: Temp:  [97.8 F (36.6 C)-98.3 F (36.8 C)] 98.3 F (36.8 C) (08/28 0400) Pulse Rate:  [52-125] 122  (08/28 0700) Resp:  [5-25] 14  (08/28 0700) BP: (95-128)/(55-88) 126/88 mmHg (08/28 0700) SpO2:  [96 %-99 %] 98 % (08/28 0700) Weight:  [122 lb 2.2 oz (55.4 kg)] 122 lb 2.2 oz (55.4 kg) (08/28 0020)  Intake/Output from previous day: 08/27 0701 - 08/28 0700 In: 1395 [I.V.:1345; IV Piggyback:50] Out: 1300 [Urine:1300] Intake/Output from this shift:       . bisacodyl  10 mg Rectal Once  . carvedilol  3.125 mg Oral BID WC  . cefTRIAXone (ROCEPHIN)  IV  1 g Intravenous Q24H  . colchicine  0.6 mg Oral Daily  . digoxin  0.125 mg Oral Daily  . diltiazem  120 mg Oral Daily  . docusate sodium  100 mg Oral BID  . polyethylene glycol  17 g Oral BID  . sodium chloride  3 mL Intravenous Q12H  . Warfarin - Pharmacist Dosing Inpatient   Does not apply q1800      . dextrose 5 % and 0.45% NaCl 40 mL/hr at 08/22/12 1012  . DISCONTD: sodium chloride 75 mL/hr at 08/22/12 0300    Physical Exam: The patient appears to be in no distress.  Head and neck exam reveals that the pupils are equal and reactive. The extraocular movements are full. There is no scleral icterus. Mouth and pharynx are benign. No lymphadenopathy. No carotid bruits. The jugular venous pressure is normal. Thyroid is not enlarged or tender.  Chest is clear to percussion and auscultation. No rales or rhonchi. Expansion of the chest is symmetrical.  Heart reveals no abnormal lift or heave. First and second heart sounds are normal. There is no murmur gallop rub or click. The pulse is 106 and irregularly irregular. The  abdomen is soft and nontender. Bowel sounds are normoactive. There is no hepatosplenomegaly or mass. There are no abdominal bruits.  Extremities reveal no phlebitis or edema. Pedal pulses are good. There is no cyanosis or clubbing.  Neurologic exam reveals patient is alert and cooperative.   Lab Results:  Basename 08/23/12 0321 08/22/12 0645  WBC 7.6 13.1*  HGB 12.0* 12.2*  PLT 171 139*    Basename 08/23/12 0321 08/22/12 0645  NA 135 141  K 4.3 4.8  CL 102 107  CO2 28 28  GLUCOSE 98 114*  BUN 36* 34*  CREATININE 1.01 0.89   No results found for this basename: TROPONINI:2,CK,MB:2 in the last 72 hours Hepatic Function Panel No results found for this basename: PROT,ALBUMIN,AST,ALT,ALKPHOS,BILITOT,BILIDIR,IBILI in the last 72 hours No results found for this basename: CHOL in the last 72 hours No results found for this basename: PROTIME in the last 72 hours  Imaging: Dg Chest Port 1 View  08/21/2012  *RADIOLOGY REPORT*  Clinical Data: Stroke.  PORTABLE CHEST - 1 VIEW  Comparison: 03/30/2011  Findings: The heart size and mediastinal contours are within normal limits.  Both lungs are clear.  The visualized skeletal structures are unremarkable.  IMPRESSION: No active disease.   Original Report Authenticated By: Rosealee Albee, M.D.    Dg Knee Left Port  08/21/2012  *RADIOLOGY REPORT*  Clinical Data: Pain without trauma  PORTABLE LEFT KNEE - 1-2 VIEW  Comparison: None.  Findings: There is extensive chondrocalcinosis.  Small marginal spurs about all three compartments of the knee.  Negative for fracture, dislocation, or other acute bony abnormality.  Small effusion in the suprapatellar bursa.  Normal mineralization and alignment.  IMPRESSION:  1.  Negative for fracture or other acute abnormality. 2. 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   Original Report Authenticated By: Osa Craver, M.D.     Cardiac Studies: Echo results in shadow  chart. Assessment/Plan:  Patient Active Hospital Problem List: 1. chronic established atrial fibrillation on long-term Coumadin,  with improved  ventricular response.  2. right brain embolic stroke secondary to atrial fibrillation  3. past history of hypertension but  blood pressure has been soft.  Plan: Continue carvedilol  3.125 mg BID         Continue low dose digoxin 0.125 mg daily for addition rate control.         Increase diltiazem to 180 CD for better heart rate control.  Okay to increase activity from cardiac standpoint.  LOS: 6 days    Cassell Clement 08/23/2012, 7:33 AM

## 2012-08-23 NOTE — Progress Notes (Signed)
Physical Therapy Treatment Patient Details Name: [name] MRN: [id number] DOB: 1927/04/16 Today's Date: 08/23/2012 Time: 9562-1308 PT Time Calculation (min): 28 min  PT Assessment / Plan / Recommendation Comments on Treatment Session  Pt continues to progress with mobility and needs continued therapy for balance and safety and cognitive issues. CIR would still be best d/c plan for pt to further his safe mobility and independence.  PT will continue to follow    Follow Up Recommendations  Inpatient Rehab;Supervision for mobility/OOB    Barriers to Discharge        Equipment Recommendations  Defer to next venue    Recommendations for Other Services Rehab consult  Frequency Min 4X/week   Plan Discharge plan remains appropriate;Frequency remains appropriate    Precautions / Restrictions Precautions Precautions: Fall Restrictions Weight Bearing Restrictions: No   Pertinent Vitals/Pain 4/10 faces scale left knee pain, meds already taken    Mobility  Bed Mobility Bed Mobility: Supine to Sit;Sitting - Scoot to Edge of Bed Supine to Sit: 5: Supervision;With rails Sitting - Scoot to Edge of Bed: 5: Supervision;With rail Details for Bed Mobility Assistance: pt needed vc's for sequencing and required increased time to complete task but was able to perform without physical assist today Transfers Transfers: Sit to Stand;Stand to Sit Sit to Stand: 4: Min assist;With upper extremity assist;From bed Stand to Sit: 4: Min assist;To chair/3-in-1;With armrests Details for Transfer Assistance: vc's for hand placement as well as safety. Tactile cues for anterior translation with sit to stand but once up vc's for erect posture due to trunk flexion Ambulation/Gait Ambulation/Gait Assistance: 4: Min assist Ambulation Distance (Feet): 60 Feet Assistive device: Rolling walker Ambulation/Gait Assistance Details: pt with narrow BOS and very small shuffling steps.  When cued, pt took long step but  quickly returned to shuffle. Vc's for safe use of RW and to extend trunk. Gait Pattern: Step-through pattern;Decreased stride length;Narrow base of support;Trunk flexed;Shuffle Gait velocity: decreased gait speed Stairs: No Wheelchair Mobility Wheelchair Mobility: No Modified Rankin (Stroke Patients Only) Pre-Morbid Rankin Score: No symptoms Modified Rankin: Moderate disability    Exercises General Exercises - Lower Extremity Ankle Circles/Pumps: AROM;Both;20 reps;Seated Quad Sets: AROM;Left;10 reps;Seated   PT Diagnosis:    PT Problem List:   PT Treatment Interventions:     PT Goals Acute Rehab PT Goals PT Goal Formulation: With patient/family Time For Goal Achievement: 08/25/12 Potential to Achieve Goals: Good Pt will go Supine/Side to Sit: with modified independence PT Goal: Supine/Side to Sit - Progress: Progressing toward goal Pt will go Sit to Supine/Side: with modified independence PT Goal: Sit to Supine/Side - Progress: Progressing toward goal Pt will go Sit to Stand: with supervision PT Goal: Sit to Stand - Progress: Progressing toward goal Pt will go Stand to Sit: with supervision PT Goal: Stand to Sit - Progress: Progressing toward goal Pt will Transfer Bed to Chair/Chair to Bed: with supervision PT Transfer Goal: Bed to Chair/Chair to Bed - Progress: Progressing toward goal Pt will Ambulate: >150 feet;with supervision;with least restrictive assistive device PT Goal: Ambulate - Progress: Progressing toward goal Pt will Go Up / Down Stairs: 3-5 stairs;with supervision;with rail(s)  Visit Information  Last PT Received On: 08/23/12 Assistance Needed: +1    Subjective Data  Subjective: "i need to get my head right" Patient Stated Goal: return home   Cognition  Overall Cognitive Status: Impaired Area of Impairment: Memory;Safety/judgement;Awareness of deficits;Problem solving;Attention Arousal/Alertness: Awake/alert Orientation Level: Appears intact for tasks  assessed Behavior During Session: [facility]  for tasks performed Current Attention Level: Sustained Attention - Other Comments: pt tends to use humor to cover up cognitive deficits Memory: Decreased recall of precautions Memory Deficits: pt demonstrates poor recall of events Safety/Judgement: Decreased awareness of safety precautions;Decreased safety judgement for tasks assessed Problem Solving: Required significant cues for sequencing with bed mobility & ambulation.   Cognition - Other Comments: pt requiring increased time to follow commands and is often slow to respond to verbal comments and questions    Balance  Balance Balance Assessed: Yes Static Standing Balance Static Standing - Balance Support: Bilateral upper extremity supported;During functional activity Static Standing - Level of Assistance: 5: Stand by assistance  End of Session PT - End of Session Equipment Utilized During Treatment: Gait belt Activity Tolerance: Patient tolerated treatment well Patient left: in chair;with call bell/phone within reach;with family/visitor present Nurse Communication: Mobility status   GP   Lyanne Co, PT  Acute Rehab Services  [phone]   Lyanne Co 08/23/2012, 3:20 PM

## 2012-08-23 NOTE — Progress Notes (Signed)
I met with patient and his wife at bedside. Insurance has approved inpt rehab admission for today. I have discussed with Junious Silk NP for Dr. Sharon Seller and patient not felt medically ready to d/c to rehab today. We will follow up in the morning. [phone] .

## 2012-08-23 NOTE — Progress Notes (Signed)
[facility] Progress Note [place] TEAM 1 - Stepdown/ICU TEAM   [name]:[id number] DOB: January 10, 1927 DOA: 08/17/2012 PCP: Oliver Barre, MD  Brief narrative: 78 year old male patient with chronic atrial fibrillation on Coumadin. He also has a previous history of CVA. He presented to the emergency department because of new onset slurred speech associated with drooling and an episode of incontinence. Diagnostic workup revealed an acute stroke. He was also in atrial fibrillation with rapid ventricular response prompting initiation of Cardizem drip.  Assessment/Plan:  CVA (cerebral vascular accident)-Acute right small nonhemorrhagic infarct seen on MRI/left hemiplegia *Not a TPA candidate secondary to unknown time of symptom onset *Neurology following *Etiology of stroke felt to be embolic secondary to long-standing atrial fibrillation *Carotid Dopplers were not indicated since the patient not a surgical candidate *2-D echocardiogram not indicated since patient was on warfarin prior to admission *Will transition to [facility] once other medical problems have stabilized  Atrial fibrillation with RVR *Rates still not adequately controlled *Appreciate cardiology assistance *Continue carvedilol and digoxin *Diltiazem dose increased today by cardiology  Encephalopathy *Felt to be metabolic in nature secondary to current urinary tract infection  Prerenal azotemia *Likely due to poor oral intake especially in the setting of patient now requiring thickened liquids and likely not drinking enough fluids *BUN has increased over the past 48 hours *An attempt was made to decrease the IV fluids on 08/22/2012 - sent BUN continues to increase we'll return fluids to 75 cc per hour *Follow electrolytes  Mild systolic dysfunction *Last echocardiogram was in 2010 demonstrated EF between 45 and 50% - patient normally followed in [facility] and may have more recent  echocardiograms *Currently compensated to mildly dehydrated  UTI (lower urinary tract infection)/Leukocytosis *Urine culture is pending *Continue empiric Rocephin  HYPERLIPIDEMIA *LDL is 96 and was not on medications prior to admission  ANEMIA-NOS *Hemoglobin stable at 12  HYPERTENSION *Systolic blood pressure has been soft over the past 24 hours with a nadir of 95 *Watch for recurrent hypotension with adjustments in Cardizem for atrial fibrillation  COPD *Compensated  Post-splenectomy  Long term current use of anticoagulant *Warfarin dosage per pharmacy *INR currently therapeutic  Left knee pain/Pseudogout *Knee x-ray 08/21/2012 shows no fracture or acute abnormality but does demonstrate tricompartmental degenerative spurring with chondrocalcinosis suggestive of CPPD *Continue colchicine - could consider joint injection as outpt in future  DVT prophylaxis: INR therapeutic on current dose of warfarin Code Status: Full code Family Communication: Spoke directly with patient's wife at the bedside as well as the patient Disposition Plan: Remain in step down. When rate better controlled will be discharged to rehabilitation  Consultants: Dr. Sethi/neurology Dr. Brackbill/cardiology Dr. Swartz/physical medicine and rehabilitation  Procedures: None  Antibiotics: Rocephin 8/26 >>>  HPI/Subjective: Patient is alert but not very talkative. No specific complaints verbalized except for mild malaise. Wife is at the bedside assisting with history.   Objective: Blood pressure 109/71, pulse 99, temperature 98.8 F (37.1 C), temperature source Oral, resp. rate 19, height 5\' 8"  (1.727 m), weight 55.4 kg (122 lb 2.2 oz), SpO2 98.00%.  Intake/Output Summary (Last 24 hours) at 08/23/12 1248 Last data filed at 08/23/12 1054  Gross per 24 hour  Intake    970 ml  Output   1400 ml  Net   -430 ml     Exam: General: No acute respiratory distress Lungs: Clear to auscultation  bilaterally without wheezes or crackles Cardiovascular: Iregular rate and rhythm without murmur gallop or rub normal S1 and  S2-atrial fibrillation with ventricular rates between 95 and 110-with stimulation and exertion rates are as high as the 130s Abdomen: Nontender, nondistended, soft, bowel sounds positive, no rebound, no ascites, no appreciable mass Musculoskeletal: No significant cyanosis, clubbing of extremities Neurological: Patient is alert, face is symmetrical, does not appear to have any expressive or receptive aphasia although was slow to respond to questions, is noted to have left-sided weakness: Left arm strength 4-/5-left leg strength 4-/5; right side within normal limits  Data Reviewed: Basic Metabolic Panel:  Lab 08/23/12 4098 08/22/12 0645 08/21/12 0420 08/19/12 0500 08/17/12 1121  NA 135 141 134* 138 139  K 4.3 4.8 4.3 4.3 4.7  CL 102 107 98 100 100  CO2 28 28 30 29  32  GLUCOSE 98 114* 129* 94 84  BUN 36* 34* 34* 26* 32*  CREATININE 1.01 0.89 1.15 1.15 1.08  CALCIUM 9.2 9.2 9.4 9.7 9.6  MG -- -- -- -- --  PHOS -- -- -- -- --   CBC:  Lab 08/23/12 0321 08/22/12 0645 08/21/12 1043 08/19/12 0500 08/17/12 1121  WBC 7.6 13.1* 10.8* 6.6 5.6  NEUTROABS -- -- -- -- 4.0  HGB 12.0* 12.2* 12.6* 14.1 13.0  HCT 35.9* 36.1* 36.9* 40.2 38.5*  MCV 98.9 98.6 97.9 98.0 99.7  PLT 171 139* 146* 150 149*   CBG:  Lab 08/20/12 1230  GLUCAP 125*    Recent Results (from the past 240 hour(s))  MRSA PCR SCREENING     Status: Normal   Collection Time   08/17/12  5:30 PM      Component Value Range Status Comment   MRSA by PCR NEGATIVE  NEGATIVE Final   URINE CULTURE     Status: Normal   Collection Time   08/21/12 10:02 AM      Component Value Range Status Comment   Specimen Description URINE, CLEAN CATCH   Final    Special Requests [facility] ADD [id number]   Final    Culture  Setup Time 08/21/2012 12:42   Final    Colony Count NO GROWTH   Final    Culture NO GROWTH   Final    Report  Status 08/22/2012 FINAL   Final      Studies:  Recent x-ray studies have been reviewed in detail by the Attending Physician  Scheduled Meds:  Reviewed in detail by the Attending Physician   Junious Silk, ANP [facility] Office  [phone] Pager [phone]  On-Call/Text Page:      [email or web address]      password [email or web address]  If 7PM-7AM, please contact night-coverage [email or web address] Password [email or web address] 08/23/2012, 12:48 PM   LOS: 6 days   I have personally examined this patient and reviewed the entire database. I have reviewed the above note, made any necessary editorial changes, and agree with its content.  Lonia Blood, MD [facility]

## 2012-08-24 DIAGNOSIS — I6789 Other cerebrovascular disease: Secondary | ICD-10-CM

## 2012-08-24 LAB — BASIC METABOLIC PANEL
Calcium: 9.2 mg/dL (ref 8.4–10.5)
Creatinine, Ser: 0.97 mg/dL (ref 0.50–1.35)
GFR calc non Af Amer: 75 mL/min — ABNORMAL LOW (ref >90)
Glucose, Bld: 102 mg/dL — ABNORMAL HIGH (ref 70–99)
Sodium: 137 mEq/L (ref 135–145)

## 2012-08-24 LAB — PROTIME-INR: INR: 2.22 — ABNORMAL HIGH (ref 0.00–1.49)

## 2012-08-24 MED ORDER — DILTIAZEM HCL ER COATED BEADS 240 MG PO CP24
240.0000 mg | ORAL_CAPSULE | Freq: Every day | ORAL | Status: DC
Start: 2012-08-24 — End: 2012-08-25
  Administered 2012-08-24 – 2012-08-25 (×2): 240 mg via ORAL
  Filled 2012-08-24 (×2): qty 1

## 2012-08-24 MED ORDER — WARFARIN SODIUM 2.5 MG PO TABS
2.5000 mg | ORAL_TABLET | Freq: Once | ORAL | Status: AC
  Administered 2012-08-24: 2.5 mg via ORAL
  Filled 2012-08-24: qty 1

## 2012-08-24 NOTE — Progress Notes (Signed)
Physical Therapy Treatment Patient Details Name: [name] MRN: [id number] DOB: May 21, 1927 Today's Date: 08/24/2012 Time: 1191-4782 PT Time Calculation (min): 30 min  PT Assessment / Plan / Recommendation Comments on Treatment Session  Pt continues to progress with mobility and needs continued therapy for balance and safety and cognitive issues. CIR would still be best d/c plan for pt to further his safe mobility and independence.  PT will continue to follow    Follow Up Recommendations  Inpatient Rehab;Supervision for mobility/OOB    Barriers to Discharge        Equipment Recommendations  Defer to next venue    Recommendations for Other Services    Frequency Min 4X/week   Plan Discharge plan remains appropriate;Frequency remains appropriate    Precautions / Restrictions Precautions Precautions: Fall Restrictions Weight Bearing Restrictions: No    Pertinent Vitals/Pain No pain reported    Mobility  Bed Mobility Bed Mobility: Not assessed Transfers Transfers: Sit to Stand;Stand to Sit Sit to Stand: 4: Min guard;With upper extremity assist;With armrests;From chair/3-in-1 Stand to Sit: 4: Min guard;With upper extremity assist;With armrests;To chair/3-in-1 Details for Transfer Assistance: Reinforcement cues for safe technique & to ensure balance before initiating ambulation Ambulation/Gait Ambulation/Gait Assistance: 4: Min guard Ambulation Distance (Feet): 200 Feet Assistive device: Rolling walker Ambulation/Gait Assistance Details: Cont's to require cues for tall posture, looking up, increased step/stride length ~75% of the time.  Corrects with cues but only maintains for ~1-2 seconds.   Gait Pattern: Decreased stride length;Step-through pattern;Trunk flexed Stairs: No Wheelchair Mobility Wheelchair Mobility: No Modified Rankin (Stroke Patients Only) Pre-Morbid Rankin Score: No symptoms Modified Rankin: Moderately severe disability     PT Goals Acute Rehab PT  Goals Time For Goal Achievement: 08/25/12 Potential to Achieve Goals: Good PT Goal: Sit to Stand - Progress: Progressing toward goal PT Goal: Stand to Sit - Progress: Progressing toward goal PT Goal: Ambulate - Progress: Progressing toward goal  Visit Information  Last PT Received On: 08/24/12 Assistance Needed: +1    Subjective Data      Cognition  Overall Cognitive Status: Impaired Area of Impairment: Memory;Safety/judgement;Awareness of deficits;Problem solving;Attention Arousal/Alertness: Awake/alert Orientation Level: Appears intact for tasks assessed Behavior During Session: [facility] for tasks performed Current Attention Level: Sustained Attention - Other Comments: pt tends to use humor to cover up cognitive deficits Memory: Decreased recall of precautions Safety/Judgement: Decreased awareness of safety precautions;Decreased safety judgement for tasks assessed Problem Solving: Required significant cues for sequencing with ambulation.   Cognition - Other Comments: pt requiring increased time to follow commands and is often slow to respond to verbal comments and questions    Balance  Balance Balance Assessed: Yes Static Standing Balance Static Standing - Balance Support: No upper extremity supported Static Standing - Level of Assistance: 5: Stand by assistance Static Standing - Comment/# of Minutes: able to stand ~2 minutes with eyes open/closed, feet shoulders width apart.  Min Assist for balance with external perturbations at hips in all directions.    End of Session PT - End of Session Equipment Utilized During Treatment: Gait belt Activity Tolerance: Patient tolerated treatment well Patient left: in chair;with call bell/phone within reach;with family/visitor present Nurse Communication: Mobility status    Verdell Face, [place] [phone] 08/24/2012

## 2012-08-24 NOTE — Progress Notes (Signed)
Would like to admit pt to CIR today.  If agree medically ready, please write d/c to CIR order.  Thanks.  [phone]

## 2012-08-24 NOTE — Progress Notes (Signed)
Arrived for check of diet tolerance and cognitive therapy. Pt toileting with RN. Wife reports pt doint well with meals and to d/c to CIR today. Will defer treatment to CIR at this point. Thank you. Harlon Ditty, MA CCC-SLP [phone]

## 2012-08-24 NOTE — Progress Notes (Signed)
ANTICOAGULATION CONSULT NOTE - Follow up Consult  Pharmacy Consult for Coumadin Indication: atrial fibrillation and stroke  Vital Signs: Temp: 98.3 F (36.8 C) (08/29 0734) Temp src: Oral (08/29 0734) BP: 118/72 mmHg (08/29 0734) Pulse Rate: 96  (08/29 0734)  Labs:  Alvira Philips 08/24/12 0414 08/23/12 0321 08/22/12 0645 08/21/12 1043  HGB -- 12.0* 12.2* --  HCT -- 35.9* 36.1* 36.9*  PLT -- 171 139* 146*  APTT -- -- -- --  LABPROT 25.0* 35.3* 36.9* --  INR 2.22* 3.46* 3.66* --  HEPARINUNFRC -- -- -- --  CREATININE 0.97 1.01 0.89 --  CKTOTAL -- -- -- --  CKMB -- -- -- --  TROPONINI -- -- -- --    Assessment: 78 yo male patient admitted with acute non-hemorrhagic stroke, on chronic coumadin for h/o stroke/afib. INR 2.22 today.   Will resume Coumadin today and reassess in AM. Per pt takes 2.5 mg every day except 5 mg on Mondays (med rec updated). CBC has been stable; no bleeding reported per RN.   Goal of Therapy:  INR 2-3 Monitor platelets by anticoagulation protocol: Yes   Plan:  Coumadin 2.5mg  x 1 today.  Continue daily INR checks.  Celedonio Miyamoto, PharmD, BCPS Clinical Pharmacist Pager [phone]  08/24/2012,8:18 AM

## 2012-08-24 NOTE — Progress Notes (Signed)
[facility] Progress Note [place] TEAM 1 - Stepdown/ICU TEAM   [name] ZOX:[id number] DOB: 10-24-1927 DOA: 08/17/2012 PCP: Oliver Barre, MD  Brief narrative: 78 year old male patient with chronic atrial fibrillation on Coumadin. He also has a previous history of CVA. He presented to the emergency department because of new onset slurred speech associated with drooling and an episode of incontinence. Diagnostic workup revealed an acute stroke. He was also in atrial fibrillation with rapid ventricular response prompting initiation of Cardizem drip.  Assessment/Plan:  CVA (cerebral vascular accident)-Acute right small nonhemorrhagic infarct seen on MRI/left hemiplegia *Not a TPA candidate secondary to unknown time of symptom onset *Neurology following *Etiology of stroke felt to be embolic secondary to long-standing atrial fibrillation *Carotid Dopplers were not indicated since the patient not a surgical candidate *2-D echocardiogram not indicated since patient was on warfarin prior to admission *Will transition to [facility] once other medical problems have stabilized  Atrial fibrillation with RVR *Rates still not adequately controlled earlier this morning. Subsequently after Cardizem dose increased (see below) resting heart rates have been in the 80s although when we were speaking to the patient his heart rate increased to 100 *Appreciate cardiology assistance *Continue carvedilol and digoxin *Diltiazem dose further increased today by cardiology *I have reviewed his last office visit with Dr. [name] in Epic. This patient has had persistent issues with erratic rate control for many years and has had varying dosages of Cardizem. He has also been evaluated by Dr. Synthia Innocent as an outpatient in the past.  Encephalopathy *Felt to be metabolic in nature secondary to current urinary tract infection  Prerenal azotemia *Likely due to poor oral intake especially in the  setting of patient now requiring thickened liquids and likely not drinking enough fluids *BUN has decreased slightly to 30 over the past 24 hours but is still reflective of volume depletion *Continue IV fluids at 75 cc per hour-please note that after increase in Cardizem dosage today patient's systolic blood pressure has decreased to the 90s *Follow electrolytes  Mild systolic dysfunction *Last echocardiogram was in 2010 demonstrated EF between 45 and 50% - patient normally followed in [facility] and may have more recent echocardiograms *Currently compensated to mildly dehydrated  ? UTI (lower urinary tract infection)/Leukocytosis *Urine culture shows no growth so likely abnormal findings on urinalysis consistent with dehydration in setting of patient with prostatic abnormalities *Consider discontinuing empiric Rocephin-leukocytosis has resolved  HYPERLIPIDEMIA *LDL is 96 and was not on medications prior to admission  ANEMIA-NOS *Hemoglobin stable at 12  HYPERTENSION *Systolic blood pressure has been soft over the past 24 hours with a nadir of 92 since Cardizem dosage increased this morning *Watch for recurrent hypotension with adjustments in Cardizem for atrial fibrillation  COPD *Compensated  Post-splenectomy  Long term current use of anticoagulant *Warfarin dosage per pharmacy *INR currently therapeutic *This patient may be an appropriate candidate for Xarelto  Left knee pain/Pseudogout *Knee x-ray 08/21/2012 shows no fracture or acute abnormality but does demonstrate tricompartmental degenerative spurring with chondrocalcinosis suggestive of CPPD *Continue colchicine - could consider joint injection as outpt in future  DVT prophylaxis: INR therapeutic on current dose of warfarin Code Status: Full code Family Communication: Spoke directly with patient's wife at the bedside as well as the patient Disposition Plan: Remain in step down. When rate better controlled will be  discharged to rehabilitation  Consultants: Dr. Sethi/neurology Dr. Brackbill/cardiology Dr. Swartz/physical medicine and rehabilitation  Procedures: None  Antibiotics: Rocephin 8/26 >>>  HPI/Subjective: Very alert.  Sitting up in chair reading the paper. Noted to be using left hand. Wife is not at bedside. No specific complaints. Confirms history of erratic ventricular rate control in the past.   Objective: Blood pressure 92/55, pulse 95, temperature 97.5 F (36.4 C), temperature source Oral, resp. rate 15, height 5\' 8"  (1.727 m), weight 55.4 kg (122 lb 2.2 oz), SpO2 97.00%.  Intake/Output Summary (Last 24 hours) at 08/24/12 1332 Last data filed at 08/24/12 1000  Gross per 24 hour  Intake   2070 ml  Output   2850 ml  Net   -780 ml     Exam: General: No acute respiratory distress Lungs: Clear to auscultation bilaterally without wheezes or crackles Cardiovascular: Iregular rate and rhythm without murmur gallop or rub normal S1 and S2-atrial fibrillation with ventricular rates between 88 at rest-with exertion/talking rates up into the low 110's Abdomen: Nontender, nondistended, soft, bowel sounds positive, no rebound, no ascites, no appreciable mass Musculoskeletal: No significant cyanosis, clubbing of extremities Neurological: Patient is alert, face is symmetrical, does not appear to have any expressive or receptive aphasia although was slow to respond to questions, is noted to have left-sided weakness: Left arm strength 4/5-left leg strength 4/5; right side within normal limits and seems improved as compared to yesterday  Data Reviewed: Basic Metabolic Panel:  Lab 08/24/12 8119 08/23/12 0321 08/22/12 0645 08/21/12 0420 08/19/12 0500  NA 137 135 141 134* 138  K 4.5 4.3 4.8 4.3 4.3  CL 101 102 107 98 100  CO2 29 28 28 30 29   GLUCOSE 102* 98 114* 129* 94  BUN 30* 36* 34* 34* 26*  CREATININE 0.97 1.01 0.89 1.15 1.15  CALCIUM 9.2 9.2 9.2 9.4 9.7  MG -- -- -- -- --  PHOS --  -- -- -- --   CBC:  Lab 08/23/12 0321 08/22/12 0645 08/21/12 1043 08/19/12 0500  WBC 7.6 13.1* 10.8* 6.6  NEUTROABS -- -- -- --  HGB 12.0* 12.2* 12.6* 14.1  HCT 35.9* 36.1* 36.9* 40.2  MCV 98.9 98.6 97.9 98.0  PLT 171 139* 146* 150   CBG:  Lab 08/20/12 1230  GLUCAP 125*    Recent Results (from the past 240 hour(s))  MRSA PCR SCREENING     Status: Normal   Collection Time   08/17/12  5:30 PM      Component Value Range Status Comment   MRSA by PCR NEGATIVE  NEGATIVE Final   URINE CULTURE     Status: Normal   Collection Time   08/21/12 10:02 AM      Component Value Range Status Comment   Specimen Description URINE, CLEAN CATCH   Final    Special Requests [facility] ADD [id number]   Final    Culture  Setup Time 08/21/2012 12:42   Final    Colony Count NO GROWTH   Final    Culture NO GROWTH   Final    Report Status 08/22/2012 FINAL   Final      Studies:  Recent x-ray studies have been reviewed in detail by the Attending Physician  Scheduled Meds:  Reviewed in detail by the Attending Physician   Junious Silk, ANP [facility] Office  [phone] Pager [phone]  On-Call/Text Page:      [email or web address]      password [email or web address]  If 7PM-7AM, please contact night-coverage [email or web address] Password [email or web address] 08/24/2012, 1:32 PM   LOS: 7 days    I have examined the patient, reviewed the chart and modified the above note  which I agree with.   Calvert Cantor, MD [phone]

## 2012-08-24 NOTE — Progress Notes (Signed)
Subjective:  Patient had a good night.  No chest pain or dyspnea. Heart rate is improved but still around 100.  Patient walked in hall yesterday. Recent echo is in shadow chart. Shows EF 45-50 %. Mild MR, moderate TR, RA is markedly enlarged.Moderate-to-severe PI. Objective:  Vital Signs in the last 24 hours: Temp:  [97.3 F (36.3 C)-98.8 F (37.1 C)] 98 F (36.7 C) (08/29 0352) Pulse Rate:  [66-112] 112  (08/29 0400) Resp:  [12-20] 15  (08/29 0400) BP: (100-116)/(61-76) 112/72 mmHg (08/29 0400) SpO2:  [97 %-98 %] 98 % (08/29 0400)  Intake/Output from previous day: 08/28 0701 - 08/29 0700 In: 2215 [P.O.:840; I.V.:1325; IV Piggyback:50] Out: 2225 [Urine:2225] Intake/Output from this shift:       . bisacodyl  10 mg Rectal Once  . carvedilol  3.125 mg Oral BID WC  . cefTRIAXone (ROCEPHIN)  IV  1 g Intravenous Q24H  . colchicine  0.6 mg Oral Daily  . digoxin  0.125 mg Oral Daily  . diltiazem  240 mg Oral Daily  . docusate sodium  100 mg Oral BID  . polyethylene glycol  17 g Oral BID  . sodium chloride  3 mL Intravenous Q12H  . Warfarin - Pharmacist Dosing Inpatient   Does not apply q1800  . DISCONTD: diltiazem  120 mg Oral Daily  . DISCONTD: diltiazem  180 mg Oral Daily      . sodium chloride 75 mL/hr at 08/24/12 0607  . DISCONTD: dextrose 5 % and 0.45% NaCl 75 mL/hr (08/23/12 1445)    Physical Exam: The patient appears to be in no distress.  Head and neck exam reveals that the pupils are equal and reactive. The extraocular movements are full. There is no scleral icterus. Mouth and pharynx are benign. No lymphadenopathy. No carotid bruits. The jugular venous pressure is normal. Thyroid is not enlarged or tender.  Chest is clear to percussion and auscultation. No rales or rhonchi. Expansion of the chest is symmetrical.  Heart reveals no abnormal lift or heave. First and second heart sounds are normal. There is no murmur gallop rub or click. The pulse is 98 and  irregularly irregular. The abdomen is soft and nontender. Bowel sounds are normoactive. There is no hepatosplenomegaly or mass. There are no abdominal bruits.  Extremities reveal no phlebitis or edema. Pedal pulses are good. There is no cyanosis or clubbing.  Neurologic exam reveals patient is alert and cooperative.   Lab Results:  Basename 08/23/12 0321 08/22/12 0645  WBC 7.6 13.1*  HGB 12.0* 12.2*  PLT 171 139*    Basename 08/24/12 0414 08/23/12 0321  NA 137 135  K 4.5 4.3  CL 101 102  CO2 29 28  GLUCOSE 102* 98  BUN 30* 36*  CREATININE 0.97 1.01   No results found for this basename: TROPONINI:2,CK,MB:2 in the last 72 hours Hepatic Function Panel No results found for this basename: PROT,ALBUMIN,AST,ALT,ALKPHOS,BILITOT,BILIDIR,IBILI in the last 72 hours No results found for this basename: CHOL in the last 72 hours No results found for this basename: PROTIME in the last 72 hours  Imaging: No results found.  Cardiac Studies: Echo results in shadow chart. Assessment/Plan:  Patient Active Hospital Problem List: 1. chronic established atrial fibrillation on long-term Coumadin,  with improved  ventricular response.  2. right brain embolic stroke secondary to atrial fibrillation  3. past history of hypertension but  blood pressure has been soft.  Plan: Continue carvedilol  3.125 mg BID  Continue low dose digoxin 0.125 mg daily for addition rate control.         Increase diltiazem to 240 CD for better heart rate control.  Okay to increase activity from cardiac standpoint. From cardiac standpoint he could be moved to CIR  LOS: 7 days    Cassell Clement 08/24/2012, 7:28 AM

## 2012-08-25 ENCOUNTER — Inpatient Hospital Stay (HOSPITAL_COMMUNITY)
Admission: RE | Admit: 2012-08-25 | Discharge: 2012-09-02 | DRG: 945 | Disposition: A | Discharge status: Home Health | Payer: Medicare Other | Source: Ambulatory Visit | Attending: Physical Medicine & Rehabilitation | Admitting: Physical Medicine & Rehabilitation

## 2012-08-25 DIAGNOSIS — I4891 Unspecified atrial fibrillation: Secondary | ICD-10-CM

## 2012-08-25 DIAGNOSIS — M1710 Unilateral primary osteoarthritis, unspecified knee: Secondary | ICD-10-CM | POA: Diagnosis present

## 2012-08-25 DIAGNOSIS — J449 Chronic obstructive pulmonary disease, unspecified: Secondary | ICD-10-CM | POA: Diagnosis present

## 2012-08-25 DIAGNOSIS — R1310 Dysphagia, unspecified: Secondary | ICD-10-CM | POA: Diagnosis present

## 2012-08-25 DIAGNOSIS — N289 Disorder of kidney and ureter, unspecified: Secondary | ICD-10-CM | POA: Diagnosis present

## 2012-08-25 DIAGNOSIS — I10 Essential (primary) hypertension: Secondary | ICD-10-CM | POA: Diagnosis present

## 2012-08-25 DIAGNOSIS — J4489 Other specified chronic obstructive pulmonary disease: Secondary | ICD-10-CM | POA: Diagnosis present

## 2012-08-25 DIAGNOSIS — Z7901 Long term (current) use of anticoagulants: Secondary | ICD-10-CM

## 2012-08-25 DIAGNOSIS — I639 Cerebral infarction, unspecified: Secondary | ICD-10-CM | POA: Diagnosis present

## 2012-08-25 DIAGNOSIS — M25562 Pain in left knee: Secondary | ICD-10-CM | POA: Diagnosis present

## 2012-08-25 DIAGNOSIS — Z8673 Personal history of transient ischemic attack (TIA), and cerebral infarction without residual deficits: Secondary | ICD-10-CM

## 2012-08-25 DIAGNOSIS — I6340 Cerebral infarction due to embolism of unspecified cerebral artery: Secondary | ICD-10-CM | POA: Diagnosis present

## 2012-08-25 DIAGNOSIS — Z5189 Encounter for other specified aftercare: Secondary | ICD-10-CM

## 2012-08-25 DIAGNOSIS — E785 Hyperlipidemia, unspecified: Secondary | ICD-10-CM | POA: Diagnosis present

## 2012-08-25 DIAGNOSIS — K5900 Constipation, unspecified: Secondary | ICD-10-CM | POA: Diagnosis present

## 2012-08-25 DIAGNOSIS — M109 Gout, unspecified: Secondary | ICD-10-CM | POA: Diagnosis present

## 2012-08-25 DIAGNOSIS — IMO0002 Reserved for concepts with insufficient information to code with codable children: Secondary | ICD-10-CM | POA: Diagnosis present

## 2012-08-25 DIAGNOSIS — I6350 Cerebral infarction due to unspecified occlusion or stenosis of unspecified cerebral artery: Secondary | ICD-10-CM

## 2012-08-25 LAB — BASIC METABOLIC PANEL
CO2: 32 mEq/L (ref 19–32)
Glucose, Bld: 98 mg/dL (ref 70–99)
Potassium: 4.4 mEq/L (ref 3.5–5.1)
Sodium: 139 mEq/L (ref 135–145)

## 2012-08-25 MED ORDER — ACETAMINOPHEN 325 MG PO TABS: 650.0000 mg | ORAL_TABLET | Freq: Four times a day (QID) | ORAL | Status: DC | PRN

## 2012-08-25 MED ORDER — SENNOSIDES-DOCUSATE SODIUM 8.6-50 MG PO TABS
1.0000 | ORAL_TABLET | Freq: Every evening | ORAL | Status: DC | PRN
  Administered 2012-08-25 – 2012-08-28 (×3): 1 via ORAL
  Filled 2012-08-25 (×3): qty 1

## 2012-08-25 MED ORDER — CARVEDILOL 3.125 MG PO TABS: 3.1250 mg | ORAL_TABLET | Freq: Two times a day (BID) | ORAL | Status: DC

## 2012-08-25 MED ORDER — DSS 100 MG PO CAPS: 100.0000 mg | ORAL_CAPSULE | Freq: Two times a day (BID) | ORAL | Status: DC

## 2012-08-25 MED ORDER — RIVAROXABAN 15 MG PO TABS: 15.0000 mg | ORAL_TABLET | Freq: Every day | ORAL | Status: DC

## 2012-08-25 MED ORDER — CARVEDILOL 3.125 MG PO TABS
3.1250 mg | ORAL_TABLET | Freq: Two times a day (BID) | ORAL | Status: DC
  Administered 2012-08-25 – 2012-08-28 (×7): 3.125 mg via ORAL
  Filled 2012-08-25 (×10): qty 1

## 2012-08-25 MED ORDER — WARFARIN SODIUM 5 MG PO TABS
5.0000 mg | ORAL_TABLET | Freq: Once | ORAL | Status: DC
  Filled 2012-08-25: qty 1

## 2012-08-25 MED ORDER — BISACODYL 10 MG RE SUPP
10.0000 mg | Freq: Every day | RECTAL | Status: DC | PRN
  Administered 2012-08-28 – 2012-08-29 (×2): 10 mg via RECTAL
  Filled 2012-08-25 (×2): qty 1

## 2012-08-25 MED ORDER — RESOURCE THICKENUP CLEAR PO POWD
ORAL | Status: DC | PRN
  Filled 2012-08-25: qty 125

## 2012-08-25 MED ORDER — ONDANSETRON HCL 4 MG/2ML IJ SOLN: 4.0000 mg | Freq: Four times a day (QID) | INTRAMUSCULAR | Status: DC | PRN

## 2012-08-25 MED ORDER — POLYETHYLENE GLYCOL 3350 17 G PO PACK: 17.0000 g | PACK | Freq: Two times a day (BID) | ORAL | Status: DC

## 2012-08-25 MED ORDER — DILTIAZEM HCL ER COATED BEADS 240 MG PO CP24: 240.0000 mg | ORAL_CAPSULE | Freq: Every day | ORAL | Status: DC

## 2012-08-25 MED ORDER — SODIUM CHLORIDE 0.9 % IV SOLN
INTRAVENOUS | Status: DC
  Administered 2012-08-27 – 2012-08-29 (×3): via INTRAVENOUS

## 2012-08-25 MED ORDER — COLCHICINE 0.6 MG PO TABS
0.6000 mg | ORAL_TABLET | Freq: Every day | ORAL | Status: DC
  Administered 2012-08-26 – 2012-08-28 (×3): 0.6 mg via ORAL
  Filled 2012-08-25 (×4): qty 1

## 2012-08-25 MED ORDER — RIVAROXABAN 15 MG PO TABS
15.0000 mg | ORAL_TABLET | Freq: Every day | ORAL | Status: DC
  Administered 2012-08-26 – 2012-09-01 (×7): 15 mg via ORAL
  Filled 2012-08-25 (×8): qty 1

## 2012-08-25 MED ORDER — SODIUM CHLORIDE 0.9 % IV SOLN: INTRAVENOUS | Status: DC

## 2012-08-25 MED ORDER — DIGOXIN 125 MCG PO TABS
0.1250 mg | ORAL_TABLET | Freq: Every day | ORAL | Status: DC
  Administered 2012-08-26 – 2012-09-02 (×8): 0.125 mg via ORAL
  Filled 2012-08-25 (×9): qty 1

## 2012-08-25 MED ORDER — RIVAROXABAN 15 MG PO TABS
15.0000 mg | ORAL_TABLET | Freq: Every day | ORAL | Status: DC
  Administered 2012-08-25: 15 mg via ORAL
  Filled 2012-08-25 (×2): qty 1

## 2012-08-25 MED ORDER — BISACODYL 10 MG RE SUPP: 10.0000 mg | Freq: Once | RECTAL | Status: DC

## 2012-08-25 MED ORDER — DILTIAZEM HCL ER COATED BEADS 240 MG PO CP24
240.0000 mg | ORAL_CAPSULE | Freq: Every day | ORAL | Status: DC
  Administered 2012-08-26 – 2012-08-30 (×5): 240 mg via ORAL
  Filled 2012-08-25 (×6): qty 1

## 2012-08-25 MED ORDER — POLYETHYLENE GLYCOL 3350 17 G PO PACK
17.0000 g | PACK | Freq: Every day | ORAL | Status: DC | PRN
  Filled 2012-08-25: qty 1

## 2012-08-25 MED ORDER — DIGOXIN 125 MCG PO TABS: 0.1250 mg | ORAL_TABLET | Freq: Every day | ORAL | Status: DC

## 2012-08-25 MED ORDER — ONDANSETRON HCL 4 MG PO TABS: 4.0000 mg | ORAL_TABLET | Freq: Four times a day (QID) | ORAL | Status: DC | PRN

## 2012-08-25 MED ORDER — RESOURCE THICKENUP CLEAR PO POWD: ORAL | Status: DC

## 2012-08-25 MED ORDER — ACETAMINOPHEN 325 MG PO TABS: 325.0000 mg | ORAL_TABLET | ORAL | Status: DC | PRN

## 2012-08-25 MED ORDER — SENNOSIDES-DOCUSATE SODIUM 8.6-50 MG PO TABS: 1.0000 | ORAL_TABLET | Freq: Every evening | ORAL | Status: DC | PRN

## 2012-08-25 MED ORDER — COLCHICINE 0.6 MG PO TABS: 0.6000 mg | ORAL_TABLET | Freq: Every day | ORAL | Status: DC

## 2012-08-25 NOTE — Progress Notes (Signed)
Insurance will approve inpt rehab if patient admitted today, but not beyond admission today. I have contacted Junious Silk NP and she is aware. [phone]. Noted cardiology clearance to admit today also.

## 2012-08-25 NOTE — Evaluation (Addendum)
Occupational Therapy Assessment and Plan  Patient Details  Name: [name] MRN: [id number] Date of Birth: 11-13-1927  OT Diagnosis: cognitive deficits and muscle weakness (generalized) Rehab Potential: Rehab Potential: Good ELOS: 1-2 weeks   Today's Date: 08/25/2012 Time: 4098-1191 Time Calculation (min): 18 min  Problem List:  Patient Active Problem List  Diagnosis  . NEOPLASM, MALIGNANT, BLADDER  . HYPERLIPIDEMIA  . ANEMIA-NOS  . Immune thrombocytopenic purpura  . ANXIETY  . HYPERTENSION  . Atrial fibrillation with RVR  . COPD  . TRANSIENT ISCHEMIC ATTACK, HX OF  . Lacunar stroke  . Pleural effusion  . Post-splenectomy  . Long term current use of anticoagulant  . CVA (cerebral vascular accident)-Acute right small nonhemorrhagic infarct seen on MRI  . Encephalopathy  . Left knee pain  . Prerenal azotemia  . UTI (lower urinary tract infection)  . Leukocytosis  . Pseudogout    Past Medical History:  Past Medical History  Diagnosis Date  . NEOPLASM, MALIGNANT, BLADDER 04/10/2009  . HYPERLIPIDEMIA 04/10/2009  . ANEMIA-NOS 04/10/2009  . 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 ANXIETY 04/10/2009  . HYPERTENSION 04/10/2009  . Atrial fibrillation 04/10/2009  . COPD 04/10/2009  . TRANSIENT ISCHEMIC ATTACK, HX OF 04/10/2009  . Bladder cancer     Dr. Darvin Neighbours  . Lacunar stroke 04/07/2011  . Pleural effusion 04/07/2011  . 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 Splenectomy   . Bilateral vats ablation   . Facial cancer     facial skin cancer    Assessment & Plan Clinical Impression: Patient is a 78 y.o. year old male with history of atrial fibrillation on chronic Coumadin therapy as well as history of TIA. Admitted a 7613 with slurred speech and altered mental status. MRI of the brain showed small nonhemorrhagic acute infarct extends from the posterior superior right lenticular nucleus and to the posterior right  corona radiata. Remote infarcts and small vessel disease type changes left corona radiata and right thalamus infarct. MRA of the head with intracranial atherosclerotic type changes. INR on admission of 1.7. Followup cardiology services for history of atrial fibrillation with rapid ventricular response. Patient on coreg, low dose digoxin added and Cardizem titrated for rate control. Echocardiogram recently completed as outpatient with his cardiologist showed EF 45-50%.Mild MR,moderate TR. 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 Patient did not receive TPA. Speech therapy followup for dysphagia and maintained on mechanical soft, nectar thick liquid diet. Complaints of left knee pain with x-rays completed 08/21/2012 showing negative for fracture or other acute changes there was some degenerative changes noted. Was started on colchicine due to suspicion of gout flare. Patient transferred to CIR on 08/26/2075 .    Patient currently requires min with basic self-care skills secondary to muscle weakness and decreased problem solving, decreased safety awareness and decreased memory.  Prior to hospitalization, patient could complete BADL and IADL with independence.  Patient will benefit from skilled intervention to increase independence with basic self-care skills prior to discharge home with care partner.  Anticipate patient will require 24 hour supervision and no further OT follow recommended.  Skilled Therapeutic Interventions/Progress Updates:  OT eval initiated this date. OT will assess bathing and dressing tomorrow. Wife is very supportive and patient is very motivated to return home.   OT Assessment Rehab Potential: Good OT Plan OT Frequency: 1-2 X/day, 60-90 minutes Estimated Length of Stay: 1-2 weeks OT Treatment/Interventions: Balance/vestibular training;Discharge planning;Functional  mobility training;Community reintegration;DME/adaptive equipment  instruction;Self Care/advanced ADL retraining;Therapeutic Exercise;Therapeutic Activities;Patient/family education;UE/LE Coordination activities;UE/LE Strength taining/ROM OT Recommendation Follow Up Recommendations: 24 hour supervision/assistance Equipment Recommended: Other (comment) (TBD)  OT Evaluation Precautions/Restrictions  Precautions Precautions: Fall Pain Pain Assessment Pain Assessment: No/denies pain Home Living/Prior Functioning Home Living Lives With: Spouse Available Help at Discharge: Family;Available 24 hours/day Type of Home: House Home Access: Stairs to enter [employer] of Steps: 4 Entrance Stairs-Rails:  (wife says she can have rail placed) Home Layout: One level Bathroom Shower/Tub: Tub/shower unit;Door [employer] Toilet: Pharmacist, community: Yes How Accessible: Accessible via walker Home Adaptive Equipment: None Prior Function Able to Take Stairs?: Yes Driving: Yes Vocation: Retired ADL ADL Statistician: Curator Method: Proofreader:  (from recliner to w/c) Vision/Perception  Vision - History Baseline Vision: No visual deficits (magnifier to read newspaper at times) Patient Visual Report: No change from baseline Vision - Assessment Eye Alignment: Within Functional Limits  Cognition Overall Cognitive Status: Impaired Arousal/Alertness: Awake/alert Orientation Level: Oriented X4 Focused Attention: Appears intact Sustained Attention: Appears intact Selective Attention: Impaired Memory: Impaired Awareness: Impaired Problem Solving: Impaired Reasoning: Impaired Sequencing: Impaired Self Monitoring: Impaired Self Correcting: Impaired Safety/Judgment: Impaired Sensation Sensation Light Touch: Appears Intact Stereognosis: Appears Intact Hot/Cold: Appears Intact Proprioception: Appears Intact Coordination Gross Motor Movements are Fluid and Coordinated: No Fine  Motor Movements are Fluid and Coordinated: No Mobility  Transfers Sit to Stand: 4: Min assist;With armrests;From chair/3-in-1 Stand to Sit: 4: Min assist;To chair/3-in-1;With upper extremity assist  Trunk/Postural Assessment  Cervical Assessment Cervical Assessment: Within Functional Limits  Extremity/Trunk Assessment RUE Assessment RUE Assessment: Exceptions to [facility] RUE AROM (degrees) Overall AROM Right Upper Extremity: Within functional limits for tasks performed RUE Strength RUE Overall Strength: Within Functional Limits for tasks performed (4/5:MMT) LUE Assessment LUE Assessment: Exceptions to WFL LUE AROM (degrees) Overall AROM Left Upper Extremity: Within functional limits for tasks assessed LUE Strength LUE Overall Strength: Within Functional Limits for tasks assessed (4/5:MMT)  See FIM for current functional status Refer to Care Plan for Long Term Goals  Recommendations for other services: None  Discharge Criteria: Patient will be discharged from OT if patient refuses treatment 3 consecutive times without medical reason, if treatment goals not met, if there is a change in medical status, if patient makes no progress towards goals or if patient is discharged from hospital.  The above assessment, treatment plan, treatment alternatives and goals were discussed and mutually agreed upon: by patient and by family  Limmie Patricia, OTR/L 08/25/2012, 4:04 PM

## 2012-08-25 NOTE — Progress Notes (Signed)
Patient: [name] Date of Encounter: 08/25/2012, 7:33 AM Admit date: 08/17/2012     Subjective  No SOB or chest pain. No palpitations. He states he feels "fairly good."   Objective   Telemetry: atrial fib rates mostly 90s this AM with occasional transient slowing of pulse with pauses up to 1.9 sec Physical Exam: Filed Vitals:   08/25/12 0331  BP: 118/83  Pulse: 111  Temp: 97.7 F (36.5 C)  Resp: 15   General: Well developed but frail appearing WM in no acute distress. Head: Normocephalic, atraumatic, sclera non-icteric, no xanthomas, nares are without discharge.  Neck: Negative for carotid bruits. JVD not elevated. Lungs: Clear bilaterally to auscultation without wheezes, rales, or rhonchi. Breathing is unlabored. Heart: Irregular rhythm with controlled rate, S1 S2 without murmurs, rubs, or gallops.  Abdomen: Soft, non-tender, non-distended with normoactive bowel sounds. No hepatomegaly. No rebound/guarding. No obvious abdominal masses. Msk:  Strength and tone appear normal for age. Extremities: No clubbing or cyanosis. No edema.  Distal pedal pulses are 2+ and equal bilaterally. Neuro: Alert and oriented X 3. Moves all extremities spontaneously. Psych:  Responds to questions appropriately with a normal affect.    Intake/Output Summary (Last 24 hours) at 08/25/12 0733 Last data filed at 08/25/12 0600  Gross per 24 hour  Intake   2175 ml  Output   2775 ml  Net   -600 ml    Inpatient Medications:    . bisacodyl  10 mg Rectal Once  . carvedilol  3.125 mg Oral BID WC  . cefTRIAXone (ROCEPHIN)  IV  1 g Intravenous Q24H  . colchicine  0.6 mg Oral Daily  . digoxin  0.125 mg Oral Daily  . diltiazem  240 mg Oral Daily  . docusate sodium  100 mg Oral BID  . polyethylene glycol  17 g Oral BID  . sodium chloride  3 mL Intravenous Q12H  . warfarin  2.5 mg Oral ONCE-1800  . Warfarin - Pharmacist Dosing Inpatient   Does not apply q1800    Labs:  [facility] 08/24/12 0414  08/23/12 0321  NA 137 135  K 4.5 4.3  CL 101 102  CO2 29 28  GLUCOSE 102* 98  BUN 30* 36*  CREATININE 0.97 1.01  CALCIUM 9.2 9.2  MG -- --  PHOS -- --    Basename 08/23/12 0321  WBC 7.6  NEUTROABS --  HGB 12.0*  HCT 35.9*  MCV 98.9  PLT 171    Radiology/Studies:  Ct Head Wo Contrast8/25/2013  *RADIOLOGY REPORT*  Clinical Data: Mental status change.  CT HEAD WITHOUT CONTRAST  Technique:  Contiguous axial images were obtained from the base of the skull through the vertex without contrast.  Comparison: Head CT 08/17/2012 and brain MRI 08/17/2012.  Findings: Stable age related cerebral atrophy and periventricular white matter disease.  Evolutionary change in the right basal ganglia infarct.  No new/acute hemispheric infarction and/or intracranial hemorrhage.  No extra-axial fluid collections.  The brainstem and cerebellum are grossly normal and stable.  The calvarium is intact and appears stable.  The paranasal sinuses and mastoid air cells are clear.  IMPRESSION:  1.  Evolutionary change in the lacunar type right basal ganglia infarct. 2.  No findings for hemispheric infarction or intracranial hemorrhage. 3.  Stable atrophy and periventricular white matter disease.   Original Report Authenticated By: P. Loralie Champagne, M.D.    Ct Head Wo Contrast 08/17/2012  *RADIOLOGY REPORT*  Clinical Data: 78 year old male with slurred speech.  CT HEAD WITHOUT CONTRAST  Technique:  Contiguous axial images were obtained from the base of the skull through the vertex without contrast.  Comparison: Brain MRI 03/31/2011 and earlier.  CT 03/30/2011.  Findings: Visualized paranasal sinuses and mastoids are clear. Visualized orbits and scalp soft tissues are within normal limits.  Stable calvarium with chronic venous vasculature greater on the right. No acute osseous abnormality identified.  Calcified atherosclerosis at the skull base.  Stable chronic linear left corona radiata lacunar infarct. Stable chronic  bilateral caudate lacunar infarcts.  New hypodensity at the genu of the right internal capsule, appears chronic.  Patchy additional bilateral cerebral white matter hypodensity. No evidence of cortically based acute infarction identified.  No acute intracranial hemorrhage identified.  No midline shift, mass effect, or evidence of mass lesion.  No ventriculomegaly. No suspicious intracranial vascular hyperdensity.  IMPRESSION: 1. No acute intracranial abnormality. 2.  Chronic small vessel ischemia with progression since 2012.   Original Report Authenticated By: Harley Hallmark, M.D.    Mr [name] Wo Contrast8/22/2013  *RADIOLOGY REPORT*  Clinical Data:  Slurred speech.  MRI BRAIN WITHOUT CONTRAST MRA HEAD WITHOUT CONTRAST  Technique: Multiplanar, multiecho pulse sequences of the brain and surrounding structures were obtained according to standard protocol without intravenous contrast.  Angiographic images of the head were obtained using MRA technique without contrast.  Comparison: 08/17/2012 CT.  MRI HEAD  Findings:  Small non hemorrhagic acute infarct extends from the posterior superior right lenticular nucleus into the posterior right corona radiata.  Remote small left corona radiata infarct.  Remote tiny right thalamic infarct.  Remote small caudate head infarcts.  Remote small right globus pallidus infarct.  Small vessel disease type changes.  No intracranial hemorrhage.  Probable developmental venous anomaly left cerebellum.  Otherwise no evidence of intracranial mass lesion noted on this unenhanced exam.  Global atrophy without hydrocephalus.  IMPRESSION:  Small non hemorrhagic acute infarct extends from the posterior superior right lenticular nucleus into the posterior right corona radiata.  Remote infarcts and small vessel disease type changes as noted above.  Probable small developmental venous anomaly left cerebellum.  Global atrophy.  Minimal mucosal thickening paranasal sinuses and partial opacification  right mastoid air cells.  MRA HEAD  Findings: Fusiform ectasia of the distal vertical segment of the left internal carotid artery.  The internal carotid arteries are ectatic bilaterally.  Prominence of the right posterior communicating artery without discrete saccular aneurysm.  Anterior circulation without medium or large size vessel significant stenosis or occlusion.  Middle cerebral artery moderate to marked branch vessel irregularity bilaterally.  Ectatic vertebral arteries and basilar artery.  Left vertebral artery is dominant in size.  Mild narrowing and irregularity of the vertebral arteries more prominent on the right.  Moderate narrowing and irregularity of the PICA bilaterally.  Ectatic basilar artery with mild irregularity and narrowing without high-grade stenosis.  Non visualized AICAs.  Prominent superior cerebellar arteries.  Posterior cerebral artery moderate to marked distal branch vessel irregularity.  IMPRESSION: Intracranial atherosclerotic type changes as detailed above.  This is most notable involving branch vessels.  Critical Value/emergent results were called by telephone at the time of interpretation on 08/17/2012 at 2:00 p.m. to Dr. Oletta Lamas, who verbally acknowledged these results.   Original Report Authenticated By: Fuller Canada, M.D.    Mr Brain Wo Contrast 08/17/2012  *RADIOLOGY REPORT*  Clinical Data:  Slurred speech.  MRI BRAIN WITHOUT CONTRAST MRA HEAD WITHOUT CONTRAST  Technique: Multiplanar, multiecho pulse sequences of the  brain and surrounding structures were obtained according to standard protocol without intravenous contrast.  Angiographic images of the head were obtained using MRA technique without contrast.  Comparison: 08/17/2012 CT.  MRI HEAD  Findings:  Small non hemorrhagic acute infarct extends from the posterior superior right lenticular nucleus into the posterior right corona radiata.  Remote small left corona radiata infarct.  Remote tiny right thalamic infarct.   Remote small caudate head infarcts.  Remote small right globus pallidus infarct.  Small vessel disease type changes.  No intracranial hemorrhage.  Probable developmental venous anomaly left cerebellum.  Otherwise no evidence of intracranial mass lesion noted on this unenhanced exam.  Global atrophy without hydrocephalus.  IMPRESSION:  Small non hemorrhagic acute infarct extends from the posterior superior right lenticular nucleus into the posterior right corona radiata.  Remote infarcts and small vessel disease type changes as noted above.  Probable small developmental venous anomaly left cerebellum.  Global atrophy.  Minimal mucosal thickening paranasal sinuses and partial opacification right mastoid air cells.  MRA HEAD  Findings: Fusiform ectasia of the distal vertical segment of the left internal carotid artery.  The internal carotid arteries are ectatic bilaterally.  Prominence of the right posterior communicating artery without discrete saccular aneurysm.  Anterior circulation without medium or large size vessel significant stenosis or occlusion.  Middle cerebral artery moderate to marked branch vessel irregularity bilaterally.  Ectatic vertebral arteries and basilar artery.  Left vertebral artery is dominant in size.  Mild narrowing and irregularity of the vertebral arteries more prominent on the right.  Moderate narrowing and irregularity of the PICA bilaterally.  Ectatic basilar artery with mild irregularity and narrowing without high-grade stenosis.  Non visualized AICAs.  Prominent superior cerebellar arteries.  Posterior cerebral artery moderate to marked distal branch vessel irregularity.  IMPRESSION: Intracranial atherosclerotic type changes as detailed above.  This is most notable involving branch vessels.  Critical Value/emergent results were called by telephone at the time of interpretation on 08/17/2012 at 2:00 p.m. to Dr. Oletta Lamas, who verbally acknowledged these results.   Original Report  Authenticated By: Fuller Canada, M.D.    Dg Chest Port 1 View 08/21/2012  *RADIOLOGY REPORT*  Clinical Data: Stroke.  PORTABLE CHEST - 1 VIEW  Comparison: 03/30/2011  Findings: The heart size and mediastinal contours are within normal limits.  Both lungs are clear.  The visualized skeletal structures are unremarkable.  IMPRESSION: No active disease.   Original Report Authenticated By: Rosealee Albee, M.D.      Assessment and Plan   78 y/o M with hx of chronic atrial fib (usually followed in [place]), prior stroke presented to [facility] 8/22 with slurred speech and drooling found to have acute stroke in the setting of a subtherapeutic INR.  1. Chronic atrial fib - HR in the 90s but tele also shows transient bradycardia as well, so doubt we can continue to push rate control. (Has not yet had morning meds.) Would continue current regimen. INR subtherapeutic today, management per pharmacy. Pt was not bridged with heparin this admission in setting of stroke. 2. R brain embolic stroke secondary to atrial fib - stroke progress per primary team, pending CIR. 3. H/o HTN with soft BPs this admission - stable. 4. LV dysfunction (echo in shadow chart) - EF 45-50%, Mild MR, moderate TR, RA is markedly enlarged, moderate-to-severe PI. ?Tachycardia mediated/afib-related. Will need f/u with primary cardiologist. 5. ?UTI - UCx is negative - will defer to primary team if abx are to be  stopped.   Note: initial neuro consult note mentions hematuria/bleeding around penis and there is hx of bladder cancer. The patient has not seen any blood but is not sure if his wife has (which is stated in the neuro note). This may need to be worked up further by internal medicine. H/H stable.  Signed, Ronie Spies PA-C  Agree with assessment as above. Will not increase AV blocking drugs any further because of occasional slowing. Generally his heart rate is staying above 80 and while eating this am heart rate 100. Okay to  transfer him to CIR today from cardiac standpoint.

## 2012-08-25 NOTE — Plan of Care (Addendum)
Overall Plan of Care [facility]) Patient Details Name: Nephi Savage MRN: [id number] DOB: Jun 27, 1927  Diagnosis:  Rehabilitation for left hemiparesis  Primary Diagnosis:    Right corona radiata and right lenticular nucleus embolic infarcts Co-morbidities: NEOPLASM, MALIGNANT, BLADDER  04/10/2009  .  HYPERLIPIDEMIA  04/10/2009  .  ANEMIA-NOS  04/10/2009  .  Immune thrombocytopenic purpura  04/10/2009  .  ANXIETY  04/10/2009  .  HYPERTENSION  04/10/2009  .  Atrial fibrillation  04/10/2009  .  COPD  04/10/2009  .  TRANSIENT ISCHEMIC ATTACK, HX OF  04/10/2009  .  Bladder cancer  Dr. Darvin Neighbours  .  Lacunar stroke    Functional Problem List  Patient demonstrates impairments in the following areas: Balance, Bladder, Bowel, Cognition, Endurance, Motor, Perception and Safety  Basic ADL's: grooming, bathing, dressing and toileting   Transfers:  bed mobility, bed to chair, toilet, tub/shower, car and furniture Locomotion:  ambulation and stairs  Additional Impairments:  Functional use of upper extremity and Social Cognition   problem solving, memory and attention  Anticipated Outcomes Item Anticipated Outcome  Eating/Swallowing  Mod I  Basic self-care  Supervision  Tolieting  Supervision  Bowel/Bladder  Managed continence with condom cath @ HS/toilet q 2 hrs /day/mod I for bowel  Transfers  Supervision: toilet/shower  Locomotion  Supervision gait  Communication    Cognition  supervision  Pain  N/A  Safety/Judgment  Supervision  Other     Therapy Plan: PT Frequency: 1-2 X/day, 60-90 minutes OT Frequency: 1-2 X/day, 60-90 minutes     Team Interventions: Item RN PT OT SLP SW TR Other  Self Care/Advanced ADL Retraining  x x      Neuromuscular Re-Education  x       Therapeutic Activities  x x   x   UE/LE Strength Training/ROM  x       UE/LE Coordination Activities  x x      Visual/Perceptual Remediation/Compensation         DME/Adaptive Equipment Instruction   x x   x   Therapeutic Exercise  x x   x   Balance/Vestibular Training  x x      Patient/Family Education x x x   x   Cognitive Remediation/Compensation  x x x     Functional Mobility Training  x x      Ambulation/Gait Training  x       Museum/gallery curator  x       Wheelchair Propulsion/Positioning  x       Functional Tourist information centre manager Reintegration  x    x   Dysphagia/Aspiration Printmaker x   x     Speech/Language Facilitation         Bladder Management         Bowel Management x        Disease Management/Prevention x        Pain Management  x       Medication Management x        Skin Care/Wound Management x x       Splinting/Orthotics  x       Discharge Planning  x x   x   Psychosocial Support  x    x                      Team Discharge Planning: Destination:  Home Projected Follow-up:  PT and HHPT v OPPT  Projected Equipment Needs:  AD TBD Patient/family involved in discharge planning:  Yes  MD ELOS: 10-12 days Medical Rehab Prognosis:  Good Assessment: 68 old male with prior lacunar infarct was admitted with left hemiparesis and diagnosed with right subcortical infarction now requiring CIR level PT OT and SLP as well 24 7 rehabilitation RN and M.D.

## 2012-08-25 NOTE — Progress Notes (Signed)
1400 admitted to rehab reviewed schedule and routine, orders and medications; see CHL for details. [name]

## 2012-08-25 NOTE — Progress Notes (Signed)
Plan is to discharge patient to CIR TODAY- Discharge summary to follow.  Junious Silk, ANP

## 2012-08-25 NOTE — H&P (Signed)
Physical Medicine and Rehabilitation Admission H&P  Chief Complaint   Patient presents with   .  MS changes and speech difficulty   :  HPI: [name] is a 78 y.o. right-handed male with history of atrial fibrillation on chronic Coumadin therapy as well as history of TIA. Admitted a 2213 with slurred speech and altered mental status. MRI of the brain showed small nonhemorrhagic acute infarct extends from the posterior superior right lenticular nucleus and to the posterior right corona radiata. Remote infarcts and small vessel disease type changes left corona radiata and right thalamus infarct. MRA of the head with intracranial atherosclerotic type changes. INR on admission of 1.7. Followup cardiology services for history of atrial fibrillation with rapid ventricular response. Patient on coreg, low dose digoxin added and Cardizem titrated for rate control. Echocardiogram recently completed as outpatient with his cardiologist showed EF 45-50%.Mild MR,moderate TR. 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 Patient did not receive TPA. Speech therapy followup for dysphagia and maintained on mechanical soft, nectar thick liquid diet. Complaints of left knee pain with x-rays completed 08/21/2012 showing negative for fracture or other acute changes there was some degenerative changes noted. Was started on colchicine due to suspicion of gout flare. Patient has episode of hematuria and was treated with short course of rocephin with recommendations to follow up with GU (Dr. Darvin Neighbours @ [facility]) past discharge. Coumadin Changed to Xarelto today per family request. Therapies ongoing and CIR recommended for progression. .  Review of Systems  HENT: Negative for hearing loss.  Eyes: Positive for double vision (chronic problems).  Respiratory: Negative for cough and shortness of breath.  Cardiovascular: Negative for chest pain and palpitations.  Gastrointestinal: Positive for  constipation. Negative for heartburn.  Genitourinary: Positive for urgency.  Gets up "half a dozen of times at night" to urinate.  Neurological: Negative for headaches.  Psychiatric/Behavioral: Positive for memory loss.   Past Medical History   Diagnosis  Date   .  NEOPLASM, MALIGNANT, BLADDER  04/10/2009   .  HYPERLIPIDEMIA  04/10/2009   .  ANEMIA-NOS  04/10/2009   .  Immune thrombocytopenic purpura  04/10/2009   .  ANXIETY  04/10/2009   .  HYPERTENSION  04/10/2009   .  Atrial fibrillation  04/10/2009   .  COPD  04/10/2009   .  TRANSIENT ISCHEMIC ATTACK, HX OF  04/10/2009   .  Bladder cancer      Dr. Darvin Neighbours   .  Lacunar stroke  04/07/2011   .  Pleural effusion  04/07/2011   .  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  Splenectomy    .  Bilateral vats ablation    .  Facial cancer      facial skin cancer    Family History   Problem  Relation  Age of Onset   .  Heart disease  Mother    .  Arthritis  Father     Social History: reports that he has never smoked. He does not have any smokeless tobacco history on file. He reports that he does not drink alcohol. His drug history not on file.  Allergies   Allergen  Reactions   .  Diazepam      REACTION: agitation   .  Ezetimibe-Simvastatin    .  Morphine  Other (See Comments)     Hallucinations.   .  Sulfonamide Derivatives  Hives    Scheduled Meds:  .  bisacodyl  10 mg  Rectal  Once   .  carvedilol  3.125 mg  Oral  BID WC   .  colchicine  0.6 mg  Oral  Daily   .  digoxin  0.125 mg  Oral  Daily   .  diltiazem  240 mg  Oral  Daily   .  docusate sodium  100 mg  Oral  BID   .  polyethylene glycol  17 g  Oral  BID   .  rivaroxaban  15 mg  Oral  Daily   .  sodium chloride  3 mL  Intravenous  Q12H   .  warfarin  2.5 mg  Oral  ONCE-1800   .  DISCONTD: cefTRIAXone (ROCEPHIN) IV  1 g  Intravenous  Q24H   .  DISCONTD: warfarin  5 mg  Oral  ONCE-1800   .  DISCONTD: Warfarin - Pharmacist Dosing Inpatient    Does not apply  q1800    Medications Prior to Admission   Medication  Sig  Dispense  Refill   .  Cholecalciferol (VITAMIN D) 2000 UNITS tablet  Take 2,000-3,000 Units by mouth daily as needed. Patient takes 2000 units daily, except for taking 3000 units (1.5 tablets) on "winter days" and days when "not feeling well."     .  DISCONTD: diltiazem (DILACOR XR) 120 MG 24 hr capsule  Take 120 mg by mouth daily.     Marland Kitchen  DISCONTD: warfarin (COUMADIN) 5 MG tablet  Take 2.5-5 mg by mouth daily. Take 2.5 mg every day except take 5 mg on Mondays      Home:  Home Living  Lives With: Spouse  Available Help at Discharge: Family;Available 24 hours/day  Type of Home: House  Home Access: Stairs to enter  [employer] of Steps: 4  Entrance Stairs-Rails: (wife says she can have rail placed)  Home Layout: One level  Bathroom Shower/Tub: Medical sales representative: Standard  Bathroom Accessibility: Yes  How Accessible: Accessible via walker  Home Adaptive Equipment: None  Functional History:  Prior Function  Able to Take Stairs?: Yes  Driving: Yes  Vocation: Retired  Functional Status:  Mobility:  Bed Mobility  Bed Mobility: Not assessed  Supine to Sit: 5: Supervision;With rails  Sitting - Scoot to Edge of Bed: 5: Supervision;With rail  Sit to Supine: 3: Mod assist;HOB flat  Transfers  Transfers: Sit to Stand;Stand to Sit  Sit to Stand: 4: Min guard;With upper extremity assist;With armrests;From chair/3-in-1  Stand to Sit: 4: Min guard;With upper extremity assist;With armrests;To chair/3-in-1  Ambulation/Gait  Ambulation/Gait Assistance: 4: Min guard  Ambulation/Gait: Patient Percentage: 70%  Ambulation Distance (Feet): 200 Feet  Assistive device: Rolling walker  Ambulation/Gait Assistance Details: Cont's to require cues for tall posture, looking up, increased step/stride length ~75% of the time. Corrects with cues but only maintains for ~1-2 seconds.  Gait Pattern: Decreased  stride length;Step-through pattern;Trunk flexed  Gait velocity: decreased gait speed  Stairs: No  Wheelchair Mobility  Wheelchair Mobility: No  ADL:  ADL  Grooming: Performed;Wash/dry hands;Min guard  Where Assessed - Grooming: Supported standing  Lower Body Dressing: Performed;Supervision/safety  Where Assessed - Lower Body Dressing: Unsupported sitting  Toilet Transfer: Simulated;Minimal assistance (chair to sink to chair)  Toilet Transfer Method: Sit to stand  Toilet Transfer Equipment: Comfort height toilet;Grab bars  Equipment Used: Gait belt  Transfers/Ambulation Related to ADLs: Min A to ambulate to sink. Pt  did lose balance once when standing at sink, attempting to throw paper towel in trash--needed assist to recover.  ADL Comments: Pt donned bil socks sitting EOB. Pt requesting to walk to bathroom to attempt having BM on commode.  Cognition:  Cognition  Overall Cognitive Status: Impaired  Arousal/Alertness: Awake/alert  Orientation Level: Oriented X4  Attention: Focused;Sustained;Selective  Focused Attention: Appears intact  Sustained Attention: Appears intact  Selective Attention: Impaired  Selective Attention Impairment: Verbal complex;Functional complex  Memory: Impaired  Memory Impairment: Decreased short term memory  Decreased Short Term Memory: Verbal complex  Awareness: Impaired  Awareness Impairment: Intellectual impairment;Emergent impairment;Anticipatory impairment  Problem Solving: Impaired  Problem Solving Impairment: Functional basic;Verbal basic  Executive Function: Reasoning;Sequencing;Self Monitoring;Self Correcting  Reasoning: Impaired  Reasoning Impairment: Verbal complex;Functional basic  Sequencing: Impaired  Sequencing Impairment: Verbal complex  Self Monitoring: Impaired  Self Monitoring Impairment: Verbal complex;Functional basic  Self Correcting: Impaired  Self Correcting Impairment: Verbal complex;Functional basic  Safety/Judgment: Impaired   Cognition  Overall Cognitive Status: Impaired  Area of Impairment: Memory;Safety/judgement;Awareness of deficits;Problem solving;Attention  Arousal/Alertness: Awake/alert  Orientation Level: Appears intact for tasks assessed  Behavior During Session: [facility] for tasks performed  Current Attention Level: Sustained  Attention - Other Comments: pt tends to use humor to cover up cognitive deficits  Memory: Decreased recall of precautions  Memory Deficits: pt demonstrates poor recall of events  Safety/Judgement: Decreased awareness of safety precautions;Decreased safety judgement for tasks assessed  Problem Solving: Required significant cues for sequencing with ambulation.  Cognition - Other Comments: pt requiring increased time to follow commands and is often slow to respond to verbal comments and questions  Blood pressure 106/68, pulse 86, temperature 98.1 F (36.7 C), temperature source Oral, resp. rate 14, height 5\' 8"  (1.727 m), weight 55.7 kg (122 lb 12.7 oz), SpO2 99.00%.  Physical Exam  Nursing note and vitals reviewed.  Constitutional: He is oriented to person, place, and time.  Thin elderly male.  HENT:  Head: Normocephalic. Dentition fair, oral mucosa pink and moist Eyes: Pupils are equal, round, and reactive to light.  Neck: Normal range of motion.  Cardiovascular: Normal rate. An irregularly irregular rhythm present. No murmur Pulmonary/Chest: Effort normal and breath sounds normal. Occasional rhonchi, breathing unlabored. Abdominal: Soft. Bowel sounds are normal.  Musculoskeletal: He exhibits no edema and no tenderness.  Neurological: He is alert and oriented to person, place, and time.  Soft voice. Flat affect. Follows 2 step commands without difficulty. Needs occasional redirection. Decreased insight into deficits. Slow movements. Mild left pronator drift. Decreased FTN and FMC on the left. Sensory exam grossly intact.   Results for orders placed during the hospital encounter  of 08/17/12 (from the past 48 hour(s))   PROTIME-INR Status: Abnormal    Collection Time    08/24/12 4:14 AM   Component  Value  Range  Comment    Prothrombin Time  25.0 (*)  11.6 - 15.2 seconds     INR  2.22 (*)  0.00 - 1.49    BASIC METABOLIC PANEL Status: Abnormal    Collection Time    08/24/12 4:14 AM   Component  Value  Range  Comment    Sodium  137  135 - 145 mEq/L     Potassium  4.5  3.5 - 5.1 mEq/L     Chloride  101  96 - 112 mEq/L     CO2  29  19 - 32 mEq/L     Glucose, Bld  102 (*)  70 -  99 mg/dL     BUN  30 (*)  6 - 23 mg/dL     Creatinine, Ser  4.09  0.50 - 1.35 mg/dL     Calcium  9.2  8.4 - 10.5 mg/dL     GFR calc non Af Amer  75 (*)  >90 mL/min     GFR calc Af Amer  87 (*)  >90 mL/min    PROTIME-INR Status: Abnormal    Collection Time    08/25/12 5:30 AM   Component  Value  Range  Comment    Prothrombin Time  19.6 (*)  11.6 - 15.2 seconds     INR  1.63 (*)  0.00 - 1.49    BASIC METABOLIC PANEL Status: Abnormal    Collection Time    08/25/12 12:10 PM   Component  Value  Range  Comment    Sodium  139  135 - 145 mEq/L     Potassium  4.4  3.5 - 5.1 mEq/L     Chloride  100  96 - 112 mEq/L     CO2  32  19 - 32 mEq/L     Glucose, Bld  98  70 - 99 mg/dL     BUN  26 (*)  6 - 23 mg/dL     Creatinine, Ser  8.11  0.50 - 1.35 mg/dL     Calcium  9.0  8.4 - 10.5 mg/dL     GFR calc non Af Amer  67 (*)  >90 mL/min     GFR calc Af Amer  78 (*)  >90 mL/min     No results found.  Post Admission Physician Evaluation:  1. Functional deficits secondary to embolic infarct in the right lenticular nucleus and the posterior right corona radiata. 2. Patient is admitted to receive collaborative, interdisciplinary care between the physiatrist, rehab nursing staff, and therapy team. 3. Patient's level of medical complexity and substantial therapy needs in context of that medical necessity cannot be provided at a lesser intensity of care such as a SNF. 4. Patient has experienced substantial  functional loss from his/her baseline which was documented above under the "Functional History" and "Functional Status" headings. Judging by the patient's diagnosis, physical exam, and functional history, the patient has potential for functional progress which will result in measurable gains while on inpatient rehab. These gains will be of substantial and practical use upon discharge in facilitating mobility and self-care at the household level. 5. Physiatrist will provide 24 hour management of medical needs as well as oversight of the therapy plan/treatment and provide guidance as appropriate regarding the interaction of the two. 6. 24 hour rehab nursing will assist with bladder management, bowel management, safety, skin/wound care, disease management, medication administration, pain management and patient education and help integrate therapy concepts, techniques,education, etc. 7. PT will assess and treat for: NMR, lower ext strength, coordination, fxnl mobility, safety. Goals are: modified independent. 8. OT will assess and treat for: UES, NMR, fxnl mobiltiy, adaptive equipment, safety, family ed. Goals are: mod I to supervision. 9. SLP will assess and treat for: cognition, swallowing. Goals are: mod I. 10. Case Management and Social Worker will assess and treat for psychological issues and discharge planning. 11. Team conference will be held weekly to assess progress toward goals and to determine barriers to discharge. 12. Patient will receive at least 3 hours of therapy per day at least 5 days per week. 13. ELOS: 5-7 days Prognosis: excellent Medical Problem List and Plan:  1. DVT Prophylaxis/Anticoagulation: Pharmaceutical: Xarelto  2. Pain Management: reports Left knee pain improved. Will monitor for now.  3. Mood: Will continue to monitor for stability. LCSW to follow up for formal evaluation.  4. Neuropsych: This patient is capable of making decisions on his/her own behalf.  5. Acute renal  insufficiency: likely due to nectar thick liquids. Push po fluids. Will start IVF at nights to prevent further dehydration  -can continue condom cath at night for now , but i want to begin timed voids in the am tomorrow..  6. A Fib: monitor with bid checks. Continue coreg, cardizem and digoxin for rate control. Blood thinner changed to xarelto today  7. ?hematuria: will recheck UA. Monitor for now.  8. DJD L-Knee: uric acid level at 4.2. Started on colchicine for ?gout flare. Will monitor to see if this helps symptoms  Ivory Broad, MD

## 2012-08-25 NOTE — Progress Notes (Signed)
ANTICOAGULATION CONSULT NOTE - Follow up Consult  Pharmacy Consult for Coumadin Indication: atrial fibrillation and stroke  Vital Signs: Temp: 97.7 F (36.5 C) (08/30 0331) Temp src: Oral (08/30 0331) BP: 118/83 mmHg (08/30 0331) Pulse Rate: 111  (08/30 0331)  Labs:  Basename 08/25/12 0530 08/24/12 0414 08/23/12 0321  HGB -- -- 12.0*  HCT -- -- 35.9*  PLT -- -- 171  APTT -- -- --  LABPROT 19.6* 25.0* 35.3*  INR 1.63* 2.22* 3.46*  HEPARINUNFRC -- -- --  CREATININE -- 0.97 1.01  CKTOTAL -- -- --  CKMB -- -- --  TROPONINI -- -- --    Assessment: 78 yo male patient admitted with acute non-hemorrhagic stroke, on chronic coumadin for h/o stroke/afib. INR 1.63 today.    Per pt takes 2.5 mg every day except 5 mg on Mondays (med rec updated). CBC has been stable; no bleeding reported per RN.   Goal of Therapy:  INR 2-3 Monitor platelets by anticoagulation protocol: Yes   Plan:  Coumadin 5mg  x 1 today.  Continue daily INR checks.  Celedonio Miyamoto, PharmD, BCPS Clinical Pharmacist Pager [phone]  08/25/2012,7:35 AM

## 2012-08-25 NOTE — Discharge Summary (Signed)
DISCHARGE SUMMARY  [name]  MR#: [id number]  DOB:04-Apr-1927  Date of Admission: 08/17/2012 Date of Discharge: 08/25/2012  Attending Physician:Annamaria Salah Butler Denmark, MD  Patient's WUJ:Leonard [name], MD  Consults:Treatment Team:  Cassell Clement, MD  Discharge Diagnoses: Principal Problem:  *CVA (cerebral vascular accident)-Acute right small nonhemorrhagic infarct seen on MRI Active Problems:  Atrial fibrillation with RVR  Encephalopathy  Prerenal azotemia  UTI (lower urinary tract infection)  Leukocytosis  HYPERLIPIDEMIA  ANEMIA-NOS  HYPERTENSION  COPD  Post-splenectomy  Long term current use of anticoagulant  Left knee pain  Pseudogout   DVT prophylaxis: On Xarelto Code Status: Full code   Consultants:  Dr. Sethi/neurology  Dr. Brackbill/cardiology  Dr. Swartz/physical medicine and rehabilitation   Procedures:  None   Antibiotics:  Rocephin 8/26 >>>8/29   Radiology: Ct Head Wo Contrast  08/20/2012  *RADIOLOGY REPORT*  Clinical Data: Mental status change.  CT HEAD WITHOUT CONTRAST  Technique:  Contiguous axial images were obtained from the base of the skull through the vertex without contrast.  Comparison: Head CT 08/17/2012 and brain MRI 08/17/2012.  Findings: Stable age related cerebral atrophy and periventricular white matter disease.  Evolutionary change in the right basal ganglia infarct.  No new/acute hemispheric infarction and/or intracranial hemorrhage.  No extra-axial fluid collections.  The brainstem and cerebellum are grossly normal and stable.  The calvarium is intact and appears stable.  The paranasal sinuses and mastoid air cells are clear.  IMPRESSION:  1.  Evolutionary change in the lacunar type right basal ganglia infarct. 2.  No findings for hemispheric infarction or intracranial hemorrhage. 3.  Stable atrophy and periventricular white matter disease.   Original Report Authenticated By: P. Loralie Champagne, M.D.    Ct Head Wo Contrast  08/17/2012   *RADIOLOGY REPORT*  Clinical Data: 78 year old male with slurred speech.  CT HEAD WITHOUT CONTRAST  Technique:  Contiguous axial images were obtained from the base of the skull through the vertex without contrast.  Comparison: Brain MRI 03/31/2011 and earlier.  CT 03/30/2011.  Findings: Visualized paranasal sinuses and mastoids are clear. Visualized orbits and scalp soft tissues are within normal limits.  Stable calvarium with chronic venous vasculature greater on the right. No acute osseous abnormality identified.  Calcified atherosclerosis at the skull base.  Stable chronic linear left corona radiata lacunar infarct. Stable chronic bilateral caudate lacunar infarcts.  New hypodensity at the genu of the right internal capsule, appears chronic.  Patchy additional bilateral cerebral white matter hypodensity. No evidence of cortically based acute infarction identified.  No acute intracranial hemorrhage identified.  No midline shift, mass effect, or evidence of mass lesion.  No ventriculomegaly. No suspicious intracranial vascular hyperdensity.  IMPRESSION: 1. No acute intracranial abnormality. 2.  Chronic small vessel ischemia with progression since 2012.   Original Report Authenticated By: Harley Hallmark, M.D.    Mr [name] Wo Contrast  08/17/2012  *RADIOLOGY REPORT*  Clinical Data:  Slurred speech.  MRI BRAIN WITHOUT CONTRAST MRA HEAD WITHOUT CONTRAST  Technique: Multiplanar, multiecho pulse sequences of the brain and surrounding structures were obtained according to standard protocol without intravenous contrast.  Angiographic images of the head were obtained using MRA technique without contrast.  Comparison: 08/17/2012 CT.  MRI HEAD  Findings:  Small non hemorrhagic acute infarct extends from the posterior superior right lenticular nucleus into the posterior right corona radiata.  Remote small left corona radiata infarct.  Remote tiny right thalamic infarct.  Remote small caudate head infarcts.  Remote small  right globus pallidus  infarct.  Small vessel disease type changes.  No intracranial hemorrhage.  Probable developmental venous anomaly left cerebellum.  Otherwise no evidence of intracranial mass lesion noted on this unenhanced exam.  Global atrophy without hydrocephalus.  IMPRESSION:  Small non hemorrhagic acute infarct extends from the posterior superior right lenticular nucleus into the posterior right corona radiata.  Remote infarcts and small vessel disease type changes as noted above.  Probable small developmental venous anomaly left cerebellum.  Global atrophy.  Minimal mucosal thickening paranasal sinuses and partial opacification right mastoid air cells.  MRA HEAD  Findings: Fusiform ectasia of the distal vertical segment of the left internal carotid artery.  The internal carotid arteries are ectatic bilaterally.  Prominence of the right posterior communicating artery without discrete saccular aneurysm.  Anterior circulation without medium or large size vessel significant stenosis or occlusion.  Middle cerebral artery moderate to marked branch vessel irregularity bilaterally.  Ectatic vertebral arteries and basilar artery.  Left vertebral artery is dominant in size.  Mild narrowing and irregularity of the vertebral arteries more prominent on the right.  Moderate narrowing and irregularity of the PICA bilaterally.  Ectatic basilar artery with mild irregularity and narrowing without high-grade stenosis.  Non visualized AICAs.  Prominent superior cerebellar arteries.  Posterior cerebral artery moderate to marked distal branch vessel irregularity.  IMPRESSION: Intracranial atherosclerotic type changes as detailed above.  This is most notable involving branch vessels.  Critical Value/emergent results were called by telephone at the time of interpretation on 08/17/2012 at 2:00 p.m. to Dr. Oletta Lamas, who verbally acknowledged these results.   Original Report Authenticated By: Fuller Canada, M.D.    Mr Brain Wo  Contrast  08/17/2012  *RADIOLOGY REPORT*  Clinical Data:  Slurred speech.  MRI BRAIN WITHOUT CONTRAST MRA HEAD WITHOUT CONTRAST  Technique: Multiplanar, multiecho pulse sequences of the brain and surrounding structures were obtained according to standard protocol without intravenous contrast.  Angiographic images of the head were obtained using MRA technique without contrast.  Comparison: 08/17/2012 CT.  MRI HEAD  Findings:  Small non hemorrhagic acute infarct extends from the posterior superior right lenticular nucleus into the posterior right corona radiata.  Remote small left corona radiata infarct.  Remote tiny right thalamic infarct.  Remote small caudate head infarcts.  Remote small right globus pallidus infarct.  Small vessel disease type changes.  No intracranial hemorrhage.  Probable developmental venous anomaly left cerebellum.  Otherwise no evidence of intracranial mass lesion noted on this unenhanced exam.  Global atrophy without hydrocephalus.  IMPRESSION:  Small non hemorrhagic acute infarct extends from the posterior superior right lenticular nucleus into the posterior right corona radiata.  Remote infarcts and small vessel disease type changes as noted above.  Probable small developmental venous anomaly left cerebellum.  Global atrophy.  Minimal mucosal thickening paranasal sinuses and partial opacification right mastoid air cells.  MRA HEAD  Findings: Fusiform ectasia of the distal vertical segment of the left internal carotid artery.  The internal carotid arteries are ectatic bilaterally.  Prominence of the right posterior communicating artery without discrete saccular aneurysm.  Anterior circulation without medium or large size vessel significant stenosis or occlusion.  Middle cerebral artery moderate to marked branch vessel irregularity bilaterally.  Ectatic vertebral arteries and basilar artery.  Left vertebral artery is dominant in size.  Mild narrowing and irregularity of the vertebral  arteries more prominent on the right.  Moderate narrowing and irregularity of the PICA bilaterally.  Ectatic basilar artery with mild irregularity and narrowing without  high-grade stenosis.  Non visualized AICAs.  Prominent superior cerebellar arteries.  Posterior cerebral artery moderate to marked distal branch vessel irregularity.  IMPRESSION: Intracranial atherosclerotic type changes as detailed above.  This is most notable involving branch vessels.  Critical Value/emergent results were called by telephone at the time of interpretation on 08/17/2012 at 2:00 p.m. to Dr. Oletta Lamas, who verbally acknowledged these results.   Original Report Authenticated By: Fuller Canada, M.D.    Dg Chest Port 1 View  08/21/2012  *RADIOLOGY REPORT*  Clinical Data: Stroke.  PORTABLE CHEST - 1 VIEW  Comparison: 03/30/2011  Findings: The heart size and mediastinal contours are within normal limits.  Both lungs are clear.  The visualized skeletal structures are unremarkable.  IMPRESSION: No active disease.   Original Report Authenticated By: Rosealee Albee, M.D.    Dg Knee Left Port  08/21/2012  *RADIOLOGY REPORT*  Clinical Data: Pain without trauma  PORTABLE LEFT KNEE - 1-2 VIEW  Comparison: None.  Findings: There is extensive chondrocalcinosis.  Small marginal spurs about all three compartments of the knee.  Negative for fracture, dislocation, or other acute bony abnormality.  Small effusion in the suprapatellar bursa.  Normal mineralization and alignment.  IMPRESSION:  1.  Negative for fracture or other acute abnormality. 2. 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   Original Report Authenticated By: Osa Craver, M.D.    Dg Swallowing Func-no Report  08/18/2012  CLINICAL DATA: stroke   FLUOROSCOPY FOR SWALLOWING FUNCTION STUDY:  Fluoroscopy was provided for swallowing function study, which was  administered by a speech pathologist.  Final results and recommendations  from this  study are contained within the speech pathology report.      Laboratory: Results for orders placed during the hospital encounter of 08/17/12 (from the past 48 hour(s))  PROTIME-INR     Status: Abnormal   Collection Time   08/24/12  4:14 AM      Component Value Range Comment   Prothrombin Time 25.0 (*) 11.6 - 15.2 seconds    INR 2.22 (*) 0.00 - 1.49   BASIC METABOLIC PANEL     Status: Abnormal   Collection Time   08/24/12  4:14 AM      Component Value Range Comment   Sodium 137  135 - 145 mEq/L    Potassium 4.5  3.5 - 5.1 mEq/L    Chloride 101  96 - 112 mEq/L    CO2 29  19 - 32 mEq/L    Glucose, Bld 102 (*) 70 - 99 mg/dL    BUN 30 (*) 6 - 23 mg/dL    Creatinine, Ser 1.91  0.50 - 1.35 mg/dL    Calcium 9.2  8.4 - 47.8 mg/dL    GFR calc non Af Amer 75 (*) >90 mL/min    GFR calc Af Amer 87 (*) >90 mL/min   PROTIME-INR     Status: Abnormal   Collection Time   08/25/12  5:30 AM      Component Value Range Comment   Prothrombin Time 19.6 (*) 11.6 - 15.2 seconds    INR 1.63 (*) 0.00 - 1.49      Medication List  As of 08/25/2012 12:56 PM   STOP taking these medications         diltiazem 120 MG 24 hr capsule      warfarin 5 MG tablet         TAKE these medications  acetaminophen 325 MG tablet   Commonly known as: TYLENOL   Take 2 tablets (650 mg total) by mouth every 6 (six) hours as needed.      bisacodyl 10 MG suppository   Commonly known as: DULCOLAX   Place 1 suppository (10 mg total) rectally once.      carvedilol 3.125 MG tablet   Commonly known as: COREG   Take 1 tablet (3.125 mg total) by mouth 2 (two) times daily with a meal.      colchicine 0.6 MG tablet   Take 1 tablet (0.6 mg total) by mouth daily.      digoxin 0.125 MG tablet   Commonly known as: LANOXIN   Take 1 tablet (0.125 mg total) by mouth daily.      diltiazem 240 MG 24 hr capsule   Commonly known as: CARDIZEM CD   Take 1 capsule (240 mg total) by mouth daily.      DSS 100 MG Caps   Take  100 mg by mouth 2 (two) times daily.      polyethylene glycol packet   Commonly known as: MIRALAX / GLYCOLAX   Take 17 g by mouth 2 (two) times daily.      RESOURCE THICKENUP CLEAR Powd   As needed based on desired thickness      Rivaroxaban 15 MG Tabs tablet   Commonly known as: XARELTO   Take 1 tablet (15 mg total) by mouth daily.      senna-docusate 8.6-50 MG per tablet   Commonly known as: Senokot-S   Take 1 tablet by mouth at bedtime as needed.      sodium chloride 0.9 % infusion   30 cc/hr      Vitamin D 2000 UNITS tablet   Take 2,000-3,000 Units by mouth daily as needed. Patient takes 2000 units daily, except for taking 3000 units (1.5 tablets) on "winter days" and days when "not feeling well."           History of present illness: 78 year old male patient with chronic atrial fibrillation on Coumadin. He also has a previous history of CVA. He presented to the emergency department because of new onset slurred speech associated with drooling and an episode of incontinence. Diagnostic workup revealed an acute stroke. He was also in atrial fibrillation with rapid ventricular response prompting initiation of Cardizem drip.    Hospital Course: Principal Problem:  *CVA (cerebral vascular accident)-Acute right small nonhemorrhagic infarct seen on MRI Active Problems:  Atrial fibrillation with RVR  Encephalopathy  Prerenal azotemia  UTI (lower urinary tract infection)  Leukocytosis  HYPERLIPIDEMIA  ANEMIA-NOS  HYPERTENSION  COPD  Post-splenectomy  Long term current use of anticoagulant  Left knee pain  Pseudogout  CVA (cerebral vascular accident)-Acute right small nonhemorrhagic infarct seen on MRI/left hemiplegia Neurology was consulted on this patient this admission. This patient was deemed not a TPA candidate secondary to unknown time of symptomatology onset. Neurology felt the etiology of the stroke was likely due to embolic source in a patient with long-standing  atrial fibrillation. This is in the setting of subtherapeutic INR presentation. Patient's neurological deficits at presentation was right-sided weakness which has progressively improved since admission. Carotid Dopplers were not indicated this admission since the patient was deemed not to be a surgical candidate. 2-D echocardiogram was not indicated since the patient was on Coumadin prior to admission. Because of his neurological deficits and the need for continued aggressive PT and OT it was determined the patient would be an  appropriate candidate for rehabilitative therapy.  Atrial fibrillation with RVR Patient has a long-standing history of atrial fibrillation with inadequate ventricular rate control in the past. Cardiology was consulted this admission. Patient was on low dose Cardizem 120 mg at time of admission. Subsequently during this admission Cardizem has been titrated up to an endpoint of 240 mg with ventricular responses at rest primarily being in the 80-90 range although with activity ventricular response can go as high as the 110 range. In addition carvedilol and digoxin were added this admission. According to the patient and his wife, he's had at least four cardioversions with the last cardioversion resulting in extremely brief conversion to sinus rhythm for less than 30 minutes. He's otherwise been deemed appropriate at this point regarding his age of fibrillation to transition to the rehabilitation unit.  Dysphagia At time of discharge to rehabilitation this patient is on a dysphagia 3 diet with nectar thick liquids. He is to take his pills: Pure with full supervision while taking medications or eating a diet. He is also to swallow twice and clear throat occasionally.  Long term current use of anticoagulant Patient was on Coumadin prior to admission and as noted presented with a subtherapeutic INR. It was felt that with this patient's presentation with stroke symptoms in the setting of  erratic INR levels that he may be a more appropriate candidate for Xarelto. This was also discussed with Dr. Patty Sermons the cardiologist who agreed. Pharmacy consultation was obtained and appropriate drug dosage initiated. We also discussed with patient and his wife risks and benefits as well as pros and cons of Xarelto versus Coumadin and they opted for Xarelto.  Prerenal azotemia Patient presented with an elevated BUN of 32 and baseline previously had been documented at 18. It was felt that the patient was volume depleted and IV fluids were initiated. An attempt was made to decrease IV fluid rate but due to patient's stroke symptoms and decreased oral intake related to need to utilize thickened fluids adequate fluid intake was not obtained. IV fluids were resumed and currently BUN is trending downward but has not normalized therefore he will continue IV fluids in rehabilitation unit. BUN on 08/25/2012 was down to 26.  Mild systolic dysfunction Last echocardiogram was completed in 2010 and this demonstrated an ejection fraction between 45 and 50%. Recently this patient has been followed by a cardiologist in [facility] but we were unable to obtain recent echocardiograms. Currently patient is clinically compensated and actually is mildly dehydrated as noted above.  ? UTI (lower urinary tract infection)/Leukocytosis Patient presented with an abnormal urinalysis that appear to be consistent with a urinary tract infection therefore he was started on empiric Rocephin. Subsequently his leukocytosis has resolved and his urine culture was negative therefore we have discontinued the Rocephin.  HYPERTENSION With the addition of multiple rate control agents and also influence blood pressure patient's blood pressures actually been soft ranging between 90 and 100 especially with increasing the Cardizem this admission. Suspect some of this has been related to concomitant volume depletion and expect rebound and blood  pressure once euvolemic status achieved.  COPD Has remained compensated  Left knee pain/Pseudogout Patient presented with left knee pain and subsequent x-ray on 05/21/2012 demonstrated no evidence of fracture or acute abnormality but did demonstrate tricompartmental degenerative spurring with chondrocalcinosis suggestive of CP PD. In addition it was felt the patient also had evidence of gout acutely. Uric acid level was actually normal. The patient was started on colchicine with  improvement of left knee pain symptoms.  History of bladder cancer Patient has a long-standing history of bladder cancer and is followed by Dr. Darvin Neighbours at [facility]. According to the patient and his wife the patient is to undergo another surveillance cystoscopy in the next 2 months. Patient has not experienced any hematuria during this admission.  Day of Discharge BP 113/71  Pulse 86  Temp 97.4 F (36.3 C) (Axillary)  Resp 15  Ht 5\' 8"  (1.727 m)  Wt 55.7 kg (122 lb 12.7 oz)  BMI 18.67 kg/m2  SpO2 99%  Physical Exam:  General appearance: alert, cooperative, appears stated age and no distress Resp: clear to auscultation bilaterally Cardio: Irregular rate and rhythm, S1, S2 normal, no murmur, click, rub or gallop, atrial flutter with variable conduction GI: soft, non-tender; bowel sounds normal; no masses,  no organomegaly Musculoskeletal: extremities normal, atraumatic, no cyanosis or joint effusion or erythema Neurologic: Grossly normal  Follow-up: Discharge to inpatient rehabilitation with anticipated followup with Dr. Patty Sermons with cardiology was deemed appropriate to discharge back to home.  Followup with Dr. Earlene Plater with urology as previously scheduled  Followup with Dr. Jonny [name] for routine post hospital evaluation within 2 weeks after discharge from rehabilitation unit  Disposition:  Discharge to inpatient rehabilitation unit 08/25/2012   Junious Silk, ANP pager  [phone]   I have examined the patient, reviewed the chart and discussed discharge plans with him and his wife.  I agree with the above d/c summary which I have modified.   Calvert Cantor, MD [phone]

## 2012-08-25 NOTE — Evaluation (Signed)
Physical Therapy Assessment and Plan  Patient Details  Name: [name] MRN: [id number] Date of Birth: 05/08/1927  PT Diagnosis: Abnormal posture, Abnormality of gait, Difficulty walking, Impaired cognition and Muscle weakness Rehab Potential: Excellent ELOS: 7-10 days   Today's Date: 08/25/2012 Time: 3086-5784 Time Calculation (min): 23 min  Problem List:  Patient Active Problem List  Diagnosis  . NEOPLASM, MALIGNANT, BLADDER  . HYPERLIPIDEMIA  . ANEMIA-NOS  . Immune thrombocytopenic purpura  . ANXIETY  . HYPERTENSION  . Atrial fibrillation with RVR  . COPD  . TRANSIENT ISCHEMIC ATTACK, HX OF  . Lacunar stroke  . Pleural effusion  . Post-splenectomy  . Long term current use of anticoagulant  . CVA (cerebral vascular accident)-Acute right small nonhemorrhagic infarct seen on MRI  . Encephalopathy  . Left knee pain  . Prerenal azotemia  . UTI (lower urinary tract infection)  . Leukocytosis  . Pseudogout    Past Medical History:  Past Medical History  Diagnosis Date  . NEOPLASM, MALIGNANT, BLADDER 04/10/2009  . HYPERLIPIDEMIA 04/10/2009  . ANEMIA-NOS 04/10/2009  . 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 ANXIETY 04/10/2009  . HYPERTENSION 04/10/2009  . Atrial fibrillation 04/10/2009  . COPD 04/10/2009  . TRANSIENT ISCHEMIC ATTACK, HX OF 04/10/2009  . Bladder cancer     Dr. Darvin Neighbours  . Lacunar stroke 04/07/2011  . Pleural effusion 04/07/2011  . 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 Splenectomy   . Bilateral vats ablation   . Facial cancer     facial skin cancer    Assessment & Plan Clinical Impression: Patient is a 78 y.o. year old male with recent admission to the hospital with history of atrial fibrillation on chronic Coumadin therapy as well as history of TIA. Admitted a 2213 with slurred speech and altered mental status. MRI of the brain showed small nonhemorrhagic acute infarct extends from the  posterior superior right lenticular nucleus and to the posterior right corona radiata. Remote infarcts and small vessel disease type changes left corona radiata and right thalamus infarct. MRA of the head with intracranial atherosclerotic type changes. INR on admission of 1.7. Followup cardiology services for history of atrial fibrillation with rapid ventricular response. Patient on coreg, low dose digoxin added and Cardizem titrated for rate control. Echocardiogram recently completed as outpatient with his cardiologist showed EF 45-50%.Mild MR,moderate TR. 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 Patient did not receive TPA. Speech therapy followup for dysphagia and maintained on mechanical soft, nectar thick liquid diet. Complaints of left knee pain with x-rays completed 08/21/2012 showing negative for fracture or other acute changes there was some degenerative changes noted. Was started on colchicine due to suspicion of gout flare.  Patient transferred to CIR on 08/25/2012 .   Patient currently requires min with mobility secondary to muscle weakness, decreased problem solving, decreased safety awareness and decreased memory and decreased standing balance, decreased postural control and decreased balance strategies.  Prior to hospitalization, patient was supervision with mobility and lived with Spouse in a House home.  Home access is 4 (wife plans to have a rail put in)Stairs to enter.  Patient will benefit from skilled PT intervention to maximize safe functional mobility, minimize fall risk and decrease caregiver burden for planned discharge home with 24 hour supervision.  Anticipate patient will HHPT v OPPT at discharge.  PT Assessment Rehab Potential: Excellent PT Plan PT Frequency: 1-2 X/day, 60-90 minutes Estimated Length  of Stay: 7-10 days PT Treatment/Interventions: Ambulation/gait training;Cognitive remediation/compensation;Balance/vestibular  training;Community reintegration;Discharge planning;DME/adaptive equipment instruction;Functional mobility training;Neuromuscular re-education;Pain management;Patient/family education;Psychosocial support;Skin care/wound management;Splinting/orthotics;Stair training;Therapeutic Activities;Therapeutic Exercise;UE/LE Strength taining/ROM;UE/LE Coordination activities;Wheelchair propulsion/positioning PT Recommendation Equipment Recommended: Other (comment) (TBD)  PT Evaluation Precautions/Restrictions Precautions Precautions: Fall Pain Pain Assessment Pain Assessment: No/denies pain Home Living/Prior Functioning Home Living Lives With: Spouse Available Help at Discharge: Family;Available 24 hours/day Type of Home: House Home Access: Stairs to enter [employer] of Steps: 4 (wife plans to have a rail put in) Entrance Stairs-Rails: None Home Layout: One level Bathroom Shower/Tub: Tub/shower unit;Door [employer] Toilet: Pharmacist, community: Yes How Accessible: Accessible via walker Home Adaptive Equipment: None Prior Function Able to Take Stairs?: Yes Driving: Yes Vocation: Retired Vision/Perception  Vision - History Baseline Vision: No visual deficits (magnifier to read newspaper at times) Patient Visual Report: No change from baseline Vision - Assessment Eye Alignment: Within Functional Limits Perception Perception: Within Functional Limits Praxis Praxis: Intact  Cognition Overall Cognitive Status: Impaired Arousal/Alertness: Awake/alert Orientation Level: Oriented X4 Focused Attention: Appears intact Sustained Attention: Appears intact Selective Attention: Impaired Memory: Impaired Memory Impairment: Decreased short term memory Awareness: Impaired Problem Solving: Impaired Reasoning: Impaired Sequencing: Impaired Self Monitoring: Impaired Self Correcting: Impaired Safety/Judgment: Impaired Sensation Sensation Light Touch: Appears  Intact Stereognosis: Appears Intact Hot/Cold: Appears Intact Proprioception: Appears Intact Coordination Gross Motor Movements are Fluid and Coordinated: No Fine Motor Movements are Fluid and Coordinated: No Motor  Motor Motor: Abnormal postural alignment and control  Mobility Transfers Sit to Stand: 4: Min assist;With armrests;From chair/3-in-1 Stand to Sit: 4: Min assist;To chair/3-in-1;With upper extremity assist Locomotion  Ambulation Ambulation/Gait Assistance: 4: Min assist  Decreased step length L > R, minor LOB posterior. Trunk/Postural Assessment  Cervical Assessment Cervical Assessment: Within Functional Limits Thoracic Assessment Thoracic Assessment: Exceptions to [facility] (flexed trunk) Lumbar Assessment Lumbar Assessment: Exceptions to [facility] (posterior pelvic tilt) Postural Control Postural Control: Deficits on evaluation  Balance Balance Balance Assessed: Yes Static Sitting Balance Static Sitting - Level of Assistance: 5: Stand by assistance Dynamic Sitting Balance Dynamic Sitting - Level of Assistance: 5: Stand by assistance Static Standing Balance Static Standing - Level of Assistance: 4: Min assist Dynamic Standing Balance Dynamic Standing - Level of Assistance: 4: Min assist Extremity Assessment  RUE Assessment RUE Assessment: Exceptions to [facility] RUE AROM (degrees) Overall AROM Right Upper Extremity: Within functional limits for tasks performed RUE Strength RUE Overall Strength: Within Functional Limits for tasks performed (4/5:MMT) LUE Assessment LUE Assessment: Exceptions to WFL LUE AROM (degrees) Overall AROM Left Upper Extremity: Within functional limits for tasks assessed LUE Strength LUE Overall Strength: Within Functional Limits for tasks assessed (4/5:MMT) RLE Assessment RLE Assessment: Exceptions to [facility] RLE Strength RLE Overall Strength Comments: strength grossly 4/5, ROM WFL LLE Assessment LLE Assessment: Exceptions to [facility] (grossly 3+/5,  decreased coordination)  See FIM for current functional status Refer to Care Plan for Long Term Goals  Recommendations for other services: None  Discharge Criteria: Patient will be discharged from PT if patient refuses treatment 3 consecutive times without medical reason, if treatment goals not met, if there is a change in medical status, if patient makes no progress towards goals or if patient is discharged from hospital.  The above assessment, treatment plan, treatment alternatives and goals were discussed and mutually agreed upon: by patient and by family  Tedd Sias 08/25/2012, 4:30 PM

## 2012-08-26 ENCOUNTER — Inpatient Hospital Stay (HOSPITAL_COMMUNITY): Payer: Medicare Other | Admitting: *Deleted

## 2012-08-26 ENCOUNTER — Inpatient Hospital Stay (HOSPITAL_COMMUNITY): Payer: Medicare Other | Admitting: Speech Pathology

## 2012-08-26 ENCOUNTER — Inpatient Hospital Stay (HOSPITAL_COMMUNITY): Payer: Medicare Other | Admitting: Physical Therapy

## 2012-08-26 DIAGNOSIS — I6330 Cerebral infarction due to thrombosis of unspecified cerebral artery: Secondary | ICD-10-CM

## 2012-08-26 DIAGNOSIS — I69993 Ataxia following unspecified cerebrovascular disease: Secondary | ICD-10-CM

## 2012-08-26 DIAGNOSIS — Z5189 Encounter for other specified aftercare: Secondary | ICD-10-CM

## 2012-08-26 LAB — URINALYSIS, ROUTINE W REFLEX MICROSCOPIC
Glucose, UA: NEGATIVE mg/dL
Leukocytes, UA: NEGATIVE
Nitrite: NEGATIVE
Protein, ur: NEGATIVE mg/dL
Urobilinogen, UA: 1 mg/dL (ref 0.0–1.0)

## 2012-08-26 LAB — URINE MICROSCOPIC-ADD ON

## 2012-08-26 NOTE — Progress Notes (Signed)
Occupational Therapy Session Note  Patient Details  Name: [name] MRN: [id number] Date of Birth: 08/15/1927  Today's Date: 08/26/2012 Time:  - 1000-1100  (60 min)  1st session                 1300-1330   (30 min)   2nd session  Short Term Goals: Week 1:  OT Short Term Goal 1 (Week 1): Patient will increase bathing to Min Assist. OT Short Term Goal 2 (Week 1): Patient will increase dressing to MIn Assist. OT Short Term Goal 3 (Week 1): Patient will increase standing tolerance to 5 minutes while performing a table top activity to increase functional performance during grooming. OT Short Term Goal 4 (Week 1): Patient will increase gross and fine motor coordination to increase functional performance during LB dressing. OT Short Term Goal 5 (Week 1): Patient will increase toileting to Min Assist.  Skilled Therapeutic Interventions/Progress Updates:     1st session:    Force use of LUE during shower with active assist.  Pt. Ambulated to shower with HH assist and min. Assist.  Stood with one arm support to wash periarea.  Pt.needed assist with donning pants in correct leg.  Pt's wife present [name]) and she assisted with set up.  Pt. Ambulated with HHA to recliner and placed with feet elevated and BS table in front with call bell and phone.   2nd session:  Engaged in therapeutic activities for balance in standing, functional mobility, eye hand coordination, LUE active assist.  Pt. Stood and putted golf balls.  He needed minimal assist for balance and had one LOB during activity.   He did well with eye had coordination with hitting hole in one about 4 feet away from hole.     Therapy Documentation Precautions:  Precautions Precautions: Fall General:   Vital Signs:   Pain:  none   ADL: ADL Toilet Transfer: Minimal assistance Toilet Transfer Method: Proofreader:  (from recliner to w/c)       See FIM for current functional status  Therapy/Group:  Individual Therapy  Humberto Seals 08/26/2012, 10:26 AM

## 2012-08-26 NOTE — Progress Notes (Signed)
Patient ID: [name], male   DOB: Oct 22, 1927, 78 y.o.   MRN: [id number]   No new complaints    [name] is a 78 y.o. right-handed male with history of atrial fibrillation on chronic Coumadin therapy as well as history of TIA. Admitted a 2213 with slurred speech and altered mental status. MRI of the brain showed small nonhemorrhagic acute infarct extends from the posterior superior right lenticular nucleus and to the posterior right corona radiata. Remote infarcts and small vessel disease type changes left corona radiata and right thalamus infarct. MRA of the head with intracranial atherosclerotic type changes. INR on admission of 1.7. Followup cardiology services for history of atrial fibrillation with rapid ventricular response. Patient on coreg, low dose digoxin added and Cardizem titrated for rate control. Echocardiogram recently completed as outpatient with his cardiologist showed EF 45-50%.Mild MR,moderate TR. 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 Patient did not receive TPA. Speech therapy followup for dysphagia and maintained on mechanical soft, nectar thick liquid diet. Complaints of left knee pain with x-rays completed 08/21/2012 showing negative for fracture or other acute changes there was some degenerative changes noted. Was started on colchicine due to suspicion of gout flare. Patient has episode of hematuria and was treated with short course of rocephin with recommendations to follow up with GU (Dr. Darvin Neighbours @ [facility]) past discharge. Coumadin Changed to Xarelto today per family request. Therapies ongoing and CIR recommended for progression. .  Review of Systems  HENT: Negative for hearing loss.  Eyes: Positive for double vision (chronic problems).  Respiratory: Negative for cough and shortness of breath.  Cardiovascular: Negative for chest pain and palpitations.  Gastrointestinal: Positive for constipation. Negative for heartburn.    Genitourinary: Positive for urgency.  Gets up "half a dozen of times at night" to urinate.  Neurological: Negative for headaches.  Psychiatric/Behavioral: Positive for memory loss.  Past Medical History   Diagnosis  Date   .  NEOPLASM, MALIGNANT, BLADDER  04/10/2009   .  HYPERLIPIDEMIA  04/10/2009   .  ANEMIA-NOS  04/10/2009   .  Immune thrombocytopenic purpura  04/10/2009   .  ANXIETY  04/10/2009   .  HYPERTENSION  04/10/2009   .  Atrial fibrillation  04/10/2009   .  COPD  04/10/2009   .  TRANSIENT ISCHEMIC ATTACK, HX OF  04/10/2009   .  Bladder cancer      Dr. Darvin Neighbours   .  Lacunar stroke  04/07/2011   .  Pleural effusion  04/07/2011   .  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  Splenectomy    .  Bilateral vats ablation    .  Facial cancer      facial skin cancer    Family History   Problem  Relation  Age of Onset   .  Heart disease  Mother    .  Arthritis  Father    Social History: reports that he has never smoked. He does not have any smokeless tobacco history on file. He reports that he does not drink alcohol. His drug history not on file.  Allergies   Allergen  Reactions   .  Diazepam      REACTION: agitation   .  Ezetimibe-Simvastatin    .  Morphine  Other (See Comments)     Hallucinations.   .  Sulfonamide Derivatives  Hives   Scheduled Meds:  .  bisacodyl  10 mg  Rectal  Once   .  carvedilol  3.125 mg  Oral  BID WC   .  colchicine  0.6 mg  Oral  Daily   .  digoxin  0.125 mg  Oral  Daily   .  diltiazem  240 mg  Oral  Daily   .  docusate sodium  100 mg  Oral  BID   .  polyethylene glycol  17 g  Oral  BID   .  rivaroxaban  15 mg  Oral  Daily   .  sodium chloride  3 mL  Intravenous  Q12H   .  warfarin  2.5 mg  Oral  ONCE-1800   .  DISCONTD: cefTRIAXone (ROCEPHIN) IV  1 g  Intravenous  Q24H   .  DISCONTD: warfarin  5 mg  Oral  ONCE-1800   .  DISCONTD: Warfarin - Pharmacist Dosing Inpatient   Does not apply  q1800    Medications  Prior to Admission   Medication  Sig  Dispense  Refill   .  Cholecalciferol (VITAMIN D) 2000 UNITS tablet  Take 2,000-3,000 Units by mouth daily as needed. Patient takes 2000 units daily, except for taking 3000 units (1.5 tablets) on "winter days" and days when "not feeling well."     .  DISCONTD: diltiazem (DILACOR XR) 120 MG 24 hr capsule  Take 120 mg by mouth daily.     Marland Kitchen  DISCONTD: warfarin (COUMADIN) 5 MG tablet  Take 2.5-5 mg by mouth daily. Take 2.5 mg every day except take 5 mg on Mondays     Home:  Home Living  Lives With: Spouse  Available Help at Discharge: Family;Available 24 hours/day  Type of Home: House  Home Access: Stairs to enter  [employer] of Steps: 4  Entrance Stairs-Rails: (wife says she can have rail placed)  Home Layout: One level  Bathroom Shower/Tub: Medical sales representative: Standard  Bathroom Accessibility: Yes  How Accessible: Accessible via walker  Home Adaptive Equipment: None  Functional History:  Prior Function  Able to Take Stairs?: Yes  Driving: Yes  Vocation: Retired  Functional Status:  Mobility:  Bed Mobility  Bed Mobility: Not assessed  Supine to Sit: 5: Supervision;With rails  Sitting - Scoot to Edge of Bed: 5: Supervision;With rail  Sit to Supine: 3: Mod assist;HOB flat  Transfers  Transfers: Sit to Stand;Stand to Sit  Sit to Stand: 4: Min guard;With upper extremity assist;With armrests;From chair/3-in-1  Stand to Sit: 4: Min guard;With upper extremity assist;With armrests;To chair/3-in-1  Ambulation/Gait  Ambulation/Gait Assistance: 4: Min guard  Ambulation/Gait: Patient Percentage: 70%  Ambulation Distance (Feet): 200 Feet  Assistive device: Rolling walker  Ambulation/Gait Assistance Details: Cont's to require cues for tall posture, looking up, increased step/stride length ~75% of the time. Corrects with cues but only maintains for ~1-2 seconds.  Gait Pattern: Decreased stride length;Step-through pattern;Trunk  flexed  Gait velocity: decreased gait speed  Stairs: No  Wheelchair Mobility  Wheelchair Mobility: No  ADL:  ADL  Grooming: Performed;Wash/dry hands;Min guard  Where Assessed - Grooming: Supported standing  Lower Body Dressing: Performed;Supervision/safety  Where Assessed - Lower Body Dressing: Unsupported sitting  Toilet Transfer: Simulated;Minimal assistance (chair to sink to chair)  Toilet Transfer Method: Sit to stand  Toilet Transfer Equipment: Comfort height toilet;Grab bars  Equipment Used: Gait belt  Transfers/Ambulation Related to ADLs: Min A to ambulate to sink. Pt did lose balance once when standing at sink, attempting to  throw paper towel in trash--needed assist to recover.  ADL Comments: Pt donned bil socks sitting EOB. Pt requesting to walk to bathroom to attempt having BM on commode.  Cognition:  Cognition  Overall Cognitive Status: Impaired  Arousal/Alertness: Awake/alert  Orientation Level: Oriented X4  Attention: Focused;Sustained;Selective  Focused Attention: Appears intact  Sustained Attention: Appears intact  Selective Attention: Impaired  Selective Attention Impairment: Verbal complex;Functional complex  Memory: Impaired  Memory Impairment: Decreased short term memory  Decreased Short Term Memory: Verbal complex  Awareness: Impaired  Awareness Impairment: Intellectual impairment;Emergent impairment;Anticipatory impairment  Problem Solving: Impaired  Problem Solving Impairment: Functional basic;Verbal basic  Executive Function: Reasoning;Sequencing;Self Monitoring;Self Correcting  Reasoning: Impaired  Reasoning Impairment: Verbal complex;Functional basic  Sequencing: Impaired  Sequencing Impairment: Verbal complex  Self Monitoring: Impaired  Self Monitoring Impairment: Verbal complex;Functional basic  Self Correcting: Impaired  Self Correcting Impairment: Verbal complex;Functional basic  Safety/Judgment: Impaired  Cognition  Overall Cognitive Status:  Impaired  Area of Impairment: Memory;Safety/judgement;Awareness of deficits;Problem solving;Attention  Arousal/Alertness: Awake/alert  Orientation Level: Appears intact for tasks assessed  Behavior During Session: [place] for tasks performed  Current Attention Level: Sustained  Attention - Other Comments: pt tends to use humor to cover up cognitive deficits  Memory: Decreased recall of precautions  Memory Deficits: pt demonstrates poor recall of events  Safety/Judgement: Decreased awareness of safety precautions;Decreased safety judgement for tasks assessed  Problem Solving: Required significant cues for sequencing with ambulation.  Cognition - Other Comments: pt requiring increased time to follow commands and is often slow to respond to verbal comments and questions  Blood pressure 106/68, pulse 86, temperature 98.1 F (36.7 C), temperature source Oral, resp. rate 14, height 5\' 8"  (1.727 m), weight 55.7 kg (122 lb 12.7 oz), SpO2 99.00%.  Physical Exam  Nursing note and vitals reviewed.  Constitutional: He is oriented to person, place, and time.  Thin elderly male.  HENT:  Head: Normocephalic. Dentition fair, oral mucosa pink and moist  Eyes: Pupils are equal, round, and reactive to light.  Neck: Normal range of motion.  Cardiovascular: Normal rate. An irregularly irregular rhythm present. No murmur  Pulmonary/Chest: Effort normal and breath sounds normal. Occasional rhonchi, breathing unlabored.  Abdominal: Soft. Bowel sounds are normal.  Musculoskeletal: He exhibits no edema and no tenderness.  Neurological: He is alert and oriented to person, place, and time.  Soft voice. Flat affect. Follows 2 step commands without difficulty. Needs occasional redirection. Decreased insight into deficits. Slow movements. Mild left pronator drift. Decreased FTN and FMC on the left. Sensory exam grossly intact.  Results for orders placed during the hospital encounter of 08/17/12 (from the past 48 hour(s))     PROTIME-INR Status: Abnormal    Collection Time    08/24/12 4:14 AM   Component  Value  Range  Comment    Prothrombin Time  25.0 (*)  11.6 - 15.2 seconds     INR  2.22 (*)  0.00 - 1.49    BASIC METABOLIC PANEL Status: Abnormal    Collection Time    08/24/12 4:14 AM   Component  Value  Range  Comment    Sodium  137  135 - 145 mEq/L     Potassium  4.5  3.5 - 5.1 mEq/L     Chloride  101  96 - 112 mEq/L     CO2  29  19 - 32 mEq/L     Glucose, Bld  102 (*)  70 - 99 mg/dL  BUN  30 (*)  6 - 23 mg/dL     Creatinine, Ser  1.91  0.50 - 1.35 mg/dL     Calcium  9.2  8.4 - 10.5 mg/dL     GFR calc non Af Amer  75 (*)  >90 mL/min     GFR calc Af Amer  87 (*)  >90 mL/min    PROTIME-INR Status: Abnormal    Collection Time    08/25/12 5:30 AM   Component  Value  Range  Comment    Prothrombin Time  19.6 (*)  11.6 - 15.2 seconds     INR  1.63 (*)  0.00 - 1.49    BASIC METABOLIC PANEL Status: Abnormal    Collection Time    08/25/12 12:10 PM   Component  Value  Range  Comment    Sodium  139  135 - 145 mEq/L     Potassium  4.4  3.5 - 5.1 mEq/L     Chloride  100  96 - 112 mEq/L     CO2  32  19 - 32 mEq/L     Glucose, Bld  98  70 - 99 mg/dL     BUN  26 (*)  6 - 23 mg/dL     Creatinine, Ser  4.78  0.50 - 1.35 mg/dL     Calcium  9.0  8.4 - 10.5 mg/dL     GFR calc non Af Amer  67 (*)  >90 mL/min     GFR calc Af Amer  78 (*)  >90 mL/min    No results found.  Post Admission Physician Evaluation:  1. Functional deficits secondary to embolic infarct in the right lenticular nucleus and the posterior right corona radiata. 2. Patient is admitted to receive collaborative, interdisciplinary care between the physiatrist, rehab nursing staff, and therapy team. 3. Patient's level of medical complexity and substantial therapy needs in context of that medical necessity cannot be provided at a lesser intensity of care such as a SNF. 4. Patient has experienced substantial functional loss from his/her baseline  which was documented above under the "Functional History" and "Functional Status" headings. Judging by the patient's diagnosis, physical exam, and functional history, the patient has potential for functional progress which will result in measurable gains while on inpatient rehab. These gains will be of substantial and practical use upon discharge in facilitating mobility and self-care at the household level. 5. Physiatrist will provide 24 hour management of medical needs as well as oversight of the therapy plan/treatment and provide guidance as appropriate regarding the interaction of the two. 6. 24 hour rehab nursing will assist with bladder management, bowel management, safety, skin/wound care, disease management, medication administration, pain management and patient education and help integrate therapy concepts, techniques,education, etc. 7. PT will assess and treat for: NMR, lower ext strength, coordination, fxnl mobility, safety. Goals are: modified independent. 8. OT will assess and treat for: UES, NMR, fxnl mobiltiy, adaptive equipment, safety, family ed. Goals are: mod I to supervision. 9. SLP will assess and treat for: cognition, swallowing. Goals are: mod I. 10. Case Management and Social Worker will assess and treat for psychological issues and discharge planning. 11. Team conference will be held weekly to assess progress toward goals and to determine barriers to discharge. 12. Patient will receive at least 3 hours of therapy per day at least 5 days per week. 13. ELOS: 5-7 days Prognosis: excellent Medical Problem List and Plan:  1. DVT Prophylaxis/Anticoagulation: Pharmaceutical: Xarelto  2. Pain Management: reports Left knee pain improved. Will monitor for now.  3. Mood: Will continue to monitor for stability. LCSW to follow up for formal evaluation.  4. Neuropsych: This patient is capable of making decisions on his/her own behalf.  5. Acute renal insufficiency: likely due to nectar  thick liquids. Push po fluids. Will start IVF at nights to prevent further dehydration  -can continue condom cath at night for now , but i want to begin timed voids in the am tomorrow..  6. A Fib: monitor with bid checks. Continue coreg, cardizem and digoxin for rate control. Blood thinner changed to xarelto today  7. ?hematuria: will recheck UA. Monitor for now.  8. DJD L-Knee: uric acid level at 4.2. Started on colchicine for ?gout flare. Will monitor to see if this helps symptoms

## 2012-08-26 NOTE — Progress Notes (Signed)
Nutrition Brief Note  Patient identified on the Malnutrition Screening Tool (MST) report for unintentional weight loss, generating a score of 3.   Body mass index is 18.19 kg/(m^2). Pt meets criteria for underweight based on current BMI.   Current diet order is dysphagia 3, nectar thick, patient is consuming approximately 60-100% of meals at this time. Labs and medications reviewed.   Pt and wife report pt with excellent appetite at home and relatively stable weight. Wife states pt is new to nectar thick liquids but did well with Magic Cup when he was admitted earlier this month. Pt states he has been avoiding sweets at home because he heard that cancer would feed his bladder tumor. Discussed the lack of evidenced based research behind this myth and gave pt [facility] contact information for further questions. Discussed high calorie/protein meals/snacks/beverages for weight gain which pt's wife was interested in for pt to consume more of.  Encouraged increased meal intake, will order Magic Cup with meals.   No nutrition interventions warranted at this time. If nutrition issues arise, please consult RD.   Dietitian# [phone]

## 2012-08-26 NOTE — Progress Notes (Signed)
Physical Therapy Session Note  Patient Details  Name: [name] MRN: [id number] Date of Birth: January 07, 1927  Today's Date: 08/26/2012 Time: 1330-1430 Time Calculation (min): 60 min  Short Term Goals: Week 1:  PT Short Term Goal 1 (Week 1): =LTGs  Therapy Documentation Precautions:  Precautions Precautions: Fall  Therapeutic Exercise:(15') Nu-Step x 10' level 2, B LE's in standing with minisquats Therapeutic Activity:(30') Transfer Training including with emphasis on balance with sit<->stand, w/c to bed, toilet transfers, and dynamic standing balance activities inside parallel bars. Gait Training:(15') 3 x 200' with min-A for occasional LOB. Patient tends to lose balance toward the Left. Gait reveals frequent forefeet touching down prematurely during swing phase causing slight stumble (patient was always able to regain balance).    See FIM for current functional status  Therapy/Group: Individual Therapy  Hanley Woerner J 08/26/2012, 1:37 PM

## 2012-08-26 NOTE — Evaluation (Signed)
Speech Language Pathology Assessment and Plan  Patient Details  Name: [name] MRN: [id number] Date of Birth: 30-Oct-1927  SLP Diagnosis: Cognitive Impairments;Dysphagia  Rehab Potential: Good ELOS:     Today's Date: 08/26/2012 Time: 1300-1330 Time Calculation (min): 30 min  Problem List:  Patient Active Problem List  Diagnosis  . NEOPLASM, MALIGNANT, BLADDER  . HYPERLIPIDEMIA  . ANEMIA-NOS  . Immune thrombocytopenic purpura  . ANXIETY  . HYPERTENSION  . Atrial fibrillation with RVR  . COPD  . TRANSIENT ISCHEMIC ATTACK, HX OF  . Lacunar stroke  . Pleural effusion  . Post-splenectomy  . Long term current use of anticoagulant  . CVA (cerebral vascular accident)-Acute right small nonhemorrhagic infarct seen on MRI  . Encephalopathy  . Left knee pain  . Prerenal azotemia  . UTI (lower urinary tract infection)  . Leukocytosis  . Pseudogout   Past Medical History:  Past Medical History  Diagnosis Date  . NEOPLASM, MALIGNANT, BLADDER 04/10/2009  . HYPERLIPIDEMIA 04/10/2009  . ANEMIA-NOS 04/10/2009  . 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 ANXIETY 04/10/2009  . HYPERTENSION 04/10/2009  . Atrial fibrillation 04/10/2009  . COPD 04/10/2009  . TRANSIENT ISCHEMIC ATTACK, HX OF 04/10/2009  . Bladder cancer     Dr. Darvin Neighbours  . Lacunar stroke 04/07/2011  . Pleural effusion 04/07/2011  . 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 Splenectomy   . Bilateral vats ablation   . Facial cancer     facial skin cancer    Assessment / Plan / Recommendation Clinical Impression  [name] is a 78 y.o. right-handed male with history of atrial fibrillation on chronic Coumadin therapy as well as history of TIA. Admitted  with slurred speech and altered mental status. MRI of the brain showed small nonhemorrhagic acute infarct extends from the posterior superior right lenticular nucleus and to the posterior right corona radiata.  Remote infarcts and small vessel disease type changes left corona radiata and right thalamus infarct.  Followup cardiology services for history of atrial fibrillation with rapid ventricular response.  Pt transferred to CIR on 8/30.  Underwent MBS on 8/23 revealing mild oral, moderate pharyngeal dysphagia with recs for nectar thick liquid and mechanical soft diet. Pt presents with mild cognitive deficits, baseline but exacerbated by new CVA impacting short-term memory, higher-level problem-solving,  Furniture conservator/restorer.  Pt will benefit from  skilled SLP services to address swallow /cognition and facilitate functional gains.    SLP Assessment  Patient will need skilled Speech Lanaguage Pathology Services during CIR admission    Recommendations  Follow up Recommendations: Outpatient SLP    SLP Frequency 1-2 X/day, 30-60 minutes   SLP Treatment/Interventions Cognitive remediation/compensation;Dysphagia/aspiration precaution training;Patient/family education;Therapeutic Exercise    Pain Pain Assessment Pain Assessment: No/denies pain Prior Functioning Cognitive/Linguistic Baseline: Baseline deficits Baseline deficit details: mild short-term memory deficits.   Type of Home: House Lives With: Spouse Vocation: Retired  Teacher, music Term Goals: Week 1: SLP Short Term Goal 1 (Week 1): Pt will tolerate trials of regular consistency diet with nectar-thick liquids with min assist to follow precautions and maximize safety. SLP Short Term Goal 2 (Week 1): Pt will participate in lingual-pharyngeal strengthening exercises with min assist for execution. SLP Short Term Goal 3 (Week 1): Pt will use environmental aids to facilitate recall of new, pertinent information in order to participate in care with min assist.  See FIM for current functional status Refer to Care Plan for Long Term Goals  Recommendations for other services: None  Discharge Criteria: Patient will be discharged from SLP if patient refuses  treatment 3 consecutive times without medical reason, if treatment goals not met, if there is a change in medical status, if patient makes no progress towards goals or if patient is discharged from hospital.  The above assessment, treatment plan, treatment alternatives and goals were discussed and mutually agreed upon: by patient  Noelle Sease L. Samson Frederic, [place] CCC/SLP Pager [phone]   Blenda Mounts Laurice 08/26/2012, 4:18 PM

## 2012-08-27 ENCOUNTER — Inpatient Hospital Stay (HOSPITAL_COMMUNITY): Payer: Medicare Other

## 2012-08-27 LAB — URINE CULTURE: Colony Count: NO GROWTH

## 2012-08-27 NOTE — Progress Notes (Signed)
Physical Therapy Session Note  Patient Details  Name: [name] MRN: [id number] Date of Birth: 1927-06-20  Today's Date: 08/27/2012 Time: 4098-1191 Time Calculation (min): 43 min  Short Term Goals: Week 1:  PT Short Term Goal 1 (Week 1): =LTGs  Skilled Therapeutic Interventions/Progress Updates:    Gait training without AD with overall steady A, mild ataxia noted and LOB to L and posterior x 3 with steady A to recover. Nustep for functional strengthening and reciprocal movement pattern x 5 minutes on level 5. Obstacle course for turns, obstacle negotiation, side stepping to each side, and retro gait; demonstrated improvement with practice and overall steady A by end. Dynamic balance training with ball toss, bounce, and catch with son and added rotational movement during toss for posture and movement pattern retraining (steady A). Sit to stands while holding a ball for functional strengthening and movement pattern x 10 reps with supervision/steady A. Pt with excellent participation during session and family very supportive. Gait back to room with focus on upright posture and smooth movement quality.   Therapy Documentation Precautions:  Precautions Precautions: Fall    Pain:  Denies pain   Locomotion : Ambulation Ambulation/Gait Assistance: 4: Min assist   See FIM for current functional status  Therapy/Group: Individual Therapy  Karolee Stamps [facility] 08/27/2012, 3:52 PM

## 2012-08-27 NOTE — Progress Notes (Signed)
No new complaints    [name] is a 78 y.o. right-handed male with history of atrial fibrillation on chronic Coumadin therapy as well as history of TIA. Admitted a 2213 with slurred speech and altered mental status. MRI of the brain showed small nonhemorrhagic acute infarct extends from the posterior superior right lenticular nucleus and to the posterior right corona radiata. Remote infarcts and small vessel disease type changes left corona radiata and right thalamus infarct. MRA of the head with intracranial atherosclerotic type changes. INR on admission of 1.7. Followup cardiology services for history of atrial fibrillation with rapid ventricular response. Patient on coreg, low dose digoxin added and Cardizem titrated for rate control. Echocardiogram recently completed as outpatient with his cardiologist showed EF 45-50%.Mild MR,moderate TR. 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 Patient did not receive TPA. Speech therapy followup for dysphagia and maintained on mechanical soft, nectar thick liquid diet. Complaints of left knee pain with x-rays completed 08/21/2012 showing negative for fracture or other acute changes there was some degenerative changes noted. Was started on colchicine due to suspicion of gout flare. Patient has episode of hematuria and was treated with short course of rocephin with recommendations to follow up with GU (Dr. Darvin Neighbours @ [facility]) past discharge. Coumadin Changed to Xarelto today per family request. Therapies ongoing and CIR recommended for progression. .  Review of Systems  HENT: Negative for hearing loss.  Eyes: Positive for double vision (chronic problems).  Respiratory: Negative for cough and shortness of breath.  Cardiovascular: Negative for chest pain and palpitations.  Gastrointestinal: Positive for constipation. Negative for heartburn.  Genitourinary: Positive for urgency.  Gets up "half a dozen of times at night"  to urinate.  Neurological: Negative for headaches.  Psychiatric/Behavioral: Positive for memory loss.  Past Medical History   Diagnosis  Date   .  NEOPLASM, MALIGNANT, BLADDER  04/10/2009   .  HYPERLIPIDEMIA  04/10/2009   .  ANEMIA-NOS  04/10/2009   .  Immune thrombocytopenic purpura  04/10/2009   .  ANXIETY  04/10/2009   .  HYPERTENSION  04/10/2009   .  Atrial fibrillation  04/10/2009   .  COPD  04/10/2009   .  TRANSIENT ISCHEMIC ATTACK, HX OF  04/10/2009   .  Bladder cancer      Dr. Darvin Neighbours   .  Lacunar stroke  04/07/2011   .  Pleural effusion  04/07/2011   .  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  Splenectomy    .  Bilateral vats ablation    .  Facial cancer      facial skin cancer    Family History   Problem  Relation  Age of Onset   .  Heart disease  Mother    .  Arthritis  Father    Social History: reports that he has never smoked. He does not have any smokeless tobacco history on file. He reports that he does not drink alcohol. His drug history not on file.  Allergies   Allergen  Reactions   .  Diazepam      REACTION: agitation   .  Ezetimibe-Simvastatin    .  Morphine  Other (See Comments)     Hallucinations.   .  Sulfonamide Derivatives  Hives   Scheduled Meds:  .  bisacodyl  10 mg  Rectal  Once   .  carvedilol  3.125 mg  Oral  BID WC   .  colchicine  0.6 mg  Oral  Daily   .  digoxin  0.125 mg  Oral  Daily   .  diltiazem  240 mg  Oral  Daily   .  docusate sodium  100 mg  Oral  BID   .  polyethylene glycol  17 g  Oral  BID   .  rivaroxaban  15 mg  Oral  Daily   .  sodium chloride  3 mL  Intravenous  Q12H   .  warfarin  2.5 mg  Oral  ONCE-1800   .  DISCONTD: cefTRIAXone (ROCEPHIN) IV  1 g  Intravenous  Q24H   .  DISCONTD: warfarin  5 mg  Oral  ONCE-1800   .  DISCONTD: Warfarin - Pharmacist Dosing Inpatient   Does not apply  q1800    Medications Prior to Admission   Medication  Sig  Dispense  Refill   .  Cholecalciferol  (VITAMIN D) 2000 UNITS tablet  Take 2,000-3,000 Units by mouth daily as needed. Patient takes 2000 units daily, except for taking 3000 units (1.5 tablets) on "winter days" and days when "not feeling well."     .  DISCONTD: diltiazem (DILACOR XR) 120 MG 24 hr capsule  Take 120 mg by mouth daily.     Marland Kitchen  DISCONTD: warfarin (COUMADIN) 5 MG tablet  Take 2.5-5 mg by mouth daily. Take 2.5 mg every day except take 5 mg on Mondays     Home:  Home Living  Lives With: Spouse  Available Help at Discharge: Family;Available 24 hours/day  Type of Home: House  Home Access: Stairs to enter  [employer] of Steps: 4  Entrance Stairs-Rails: (wife says she can have rail placed)  Home Layout: One level  Bathroom Shower/Tub: Medical sales representative: Standard  Bathroom Accessibility: Yes  How Accessible: Accessible via walker  Home Adaptive Equipment: None  Functional History:  Prior Function  Able to Take Stairs?: Yes  Driving: Yes  Vocation: Retired  Functional Status:  Mobility:  Bed Mobility  Bed Mobility: Not assessed  Supine to Sit: 5: Supervision;With rails  Sitting - Scoot to Edge of Bed: 5: Supervision;With rail  Sit to Supine: 3: Mod assist;HOB flat  Transfers  Transfers: Sit to Stand;Stand to Sit  Sit to Stand: 4: Min guard;With upper extremity assist;With armrests;From chair/3-in-1  Stand to Sit: 4: Min guard;With upper extremity assist;With armrests;To chair/3-in-1  Ambulation/Gait  Ambulation/Gait Assistance: 4: Min guard  Ambulation/Gait: Patient Percentage: 70%  Ambulation Distance (Feet): 200 Feet  Assistive device: Rolling walker  Ambulation/Gait Assistance Details: Cont's to require cues for tall posture, looking up, increased step/stride length ~75% of the time. Corrects with cues but only maintains for ~1-2 seconds.  Gait Pattern: Decreased stride length;Step-through pattern;Trunk flexed  Gait velocity: decreased gait speed  Stairs: No  Wheelchair Mobility    Wheelchair Mobility: No  ADL:  ADL  Grooming: Performed;Wash/dry hands;Min guard  Where Assessed - Grooming: Supported standing  Lower Body Dressing: Performed;Supervision/safety  Where Assessed - Lower Body Dressing: Unsupported sitting  Toilet Transfer: Simulated;Minimal assistance (chair to sink to chair)  Toilet Transfer Method: Sit to stand  Toilet Transfer Equipment: Comfort height toilet;Grab bars  Equipment Used: Gait belt  Transfers/Ambulation Related to ADLs: Min A to ambulate to sink. Pt did lose balance once when standing at sink, attempting to throw paper towel in trash--needed assist to recover.  ADL Comments: Pt donned  bil socks sitting EOB. Pt requesting to walk to bathroom to attempt having BM on commode.  Cognition:  Cognition  Overall Cognitive Status: Impaired  Arousal/Alertness: Awake/alert  Orientation Level: Oriented X4  Attention: Focused;Sustained;Selective  Focused Attention: Appears intact  Sustained Attention: Appears intact  Selective Attention: Impaired  Selective Attention Impairment: Verbal complex;Functional complex  Memory: Impaired  Memory Impairment: Decreased short term memory  Decreased Short Term Memory: Verbal complex  Awareness: Impaired  Awareness Impairment: Intellectual impairment;Emergent impairment;Anticipatory impairment  Problem Solving: Impaired  Problem Solving Impairment: Functional basic;Verbal basic  Executive Function: Reasoning;Sequencing;Self Monitoring;Self Correcting  Reasoning: Impaired  Reasoning Impairment: Verbal complex;Functional basic  Sequencing: Impaired  Sequencing Impairment: Verbal complex  Self Monitoring: Impaired  Self Monitoring Impairment: Verbal complex;Functional basic  Self Correcting: Impaired  Self Correcting Impairment: Verbal complex;Functional basic  Safety/Judgment: Impaired  Cognition  Overall Cognitive Status: Impaired  Area of Impairment: Memory;Safety/judgement;Awareness of  deficits;Problem solving;Attention  Arousal/Alertness: Awake/alert  Orientation Level: Appears intact for tasks assessed  Behavior During Session: [facility] for tasks performed  Current Attention Level: Sustained  Attention - Other Comments: pt tends to use humor to cover up cognitive deficits  Memory: Decreased recall of precautions  Memory Deficits: pt demonstrates poor recall of events  Safety/Judgement: Decreased awareness of safety precautions;Decreased safety judgement for tasks assessed  Problem Solving: Required significant cues for sequencing with ambulation.  Cognition - Other Comments: pt requiring increased time to follow commands and is often slow to respond to verbal comments and questions   BP 108/72  Pulse 106  Temp 98 F (36.7 C) (Oral)  Resp 16  Ht 5' 8.9" (1.75 m)  Wt 122 lb 12.7 oz (55.7 kg)  BMI 18.19 kg/m2  SpO2 97%  Physical Exam  Nursing note and vitals reviewed.  Constitutional: He is oriented to person, place, and time.  Thin elderly male.  HENT:  Head: Normocephalic. Dentition fair, oral mucosa pink and moist  Eyes: Pupils are equal, round, and reactive to light.  Neck: Normal range of motion.  Cardiovascular: Normal rate. An irregularly irregular rhythm present. No murmur  Pulmonary/Chest: Effort normal and breath sounds normal. Occasional rhonchi, breathing unlabored.  Abdominal: Soft. Bowel sounds are normal.  Musculoskeletal: He exhibits no edema and no tenderness.  Neurological: He is alert and oriented to person, place, and time.  Soft voice. Flat affect. Follows 2 step commands without difficulty. Needs occasional redirection. Decreased insight into deficits. Slow movements. Mild left pronator drift. Decreased FTN and FMC on the left. Sensory exam grossly intact.  Results for orders placed during the hospital encounter of 08/17/12 (from the past 48 hour(s))   PROTIME-INR Status: Abnormal    Collection Time    08/24/12 4:14 AM   Component  Value   Range  Comment    Prothrombin Time  25.0 (*)  11.6 - 15.2 seconds     INR  2.22 (*)  0.00 - 1.49    BASIC METABOLIC PANEL Status: Abnormal    Collection Time    08/24/12 4:14 AM   Component  Value  Range  Comment    Sodium  137  135 - 145 mEq/L     Potassium  4.5  3.5 - 5.1 mEq/L     Chloride  101  96 - 112 mEq/L     CO2  29  19 - 32 mEq/L     Glucose, Bld  102 (*)  70 - 99 mg/dL     BUN  30 (*)  6 -  23 mg/dL     Creatinine, Ser  0.45  0.50 - 1.35 mg/dL     Calcium  9.2  8.4 - 10.5 mg/dL     GFR calc non Af Amer  75 (*)  >90 mL/min     GFR calc Af Amer  87 (*)  >90 mL/min    PROTIME-INR Status: Abnormal    Collection Time    08/25/12 5:30 AM   Component  Value  Range  Comment    Prothrombin Time  19.6 (*)  11.6 - 15.2 seconds     INR  1.63 (*)  0.00 - 1.49    BASIC METABOLIC PANEL Status: Abnormal    Collection Time    08/25/12 12:10 PM   Component  Value  Range  Comment    Sodium  139  135 - 145 mEq/L     Potassium  4.4  3.5 - 5.1 mEq/L     Chloride  100  96 - 112 mEq/L     CO2  32  19 - 32 mEq/L     Glucose, Bld  98  70 - 99 mg/dL     BUN  26 (*)  6 - 23 mg/dL     Creatinine, Ser  4.09  0.50 - 1.35 mg/dL     Calcium  9.0  8.4 - 10.5 mg/dL     GFR calc non Af Amer  67 (*)  >90 mL/min     GFR calc Af Amer  78 (*)  >90 mL/min    No results found.  Post Admission Physician Evaluation:  1. Functional deficits secondary to embolic infarct in the right lenticular nucleus and the posterior right corona radiata. 2. Patient is admitted to receive collaborative, interdisciplinary care between the physiatrist, rehab nursing staff, and therapy team. 3. Patient's level of medical complexity and substantial therapy needs in context of that medical necessity cannot be provided at a lesser intensity of care such as a SNF. 4. Patient has experienced substantial functional loss from his/her baseline which was documented above under the "Functional History" and "Functional Status" headings.  Judging by the patient's diagnosis, physical exam, and functional history, the patient has potential for functional progress which will result in measurable gains while on inpatient rehab. These gains will be of substantial and practical use upon discharge in facilitating mobility and self-care at the household level. 5. Physiatrist will provide 24 hour management of medical needs as well as oversight of the therapy plan/treatment and provide guidance as appropriate regarding the interaction of the two. 6. 24 hour rehab nursing will assist with bladder management, bowel management, safety, skin/wound care, disease management, medication administration, pain management and patient education and help integrate therapy concepts, techniques,education, etc. 7. PT will assess and treat for: NMR, lower ext strength, coordination, fxnl mobility, safety. Goals are: modified independent. 8. OT will assess and treat for: UES, NMR, fxnl mobiltiy, adaptive equipment, safety, family ed. Goals are: mod I to supervision. 9. SLP will assess and treat for: cognition, swallowing. Goals are: mod I. 10. Case Management and Social Worker will assess and treat for psychological issues and discharge planning. 11. Team conference will be held weekly to assess progress toward goals and to determine barriers to discharge. 12. Patient will receive at least 3 hours of therapy per day at least 5 days per week. 13. ELOS: 5-7 days Prognosis: excellent Medical Problem List and Plan:  1. DVT Prophylaxis/Anticoagulation: Pharmaceutical: Xarelto  2. Pain Management: reports Left knee pain  improved. Will monitor for now.  3. Mood: Will continue to monitor for stability. LCSW to follow up for formal evaluation.  4. Neuropsych: This patient is capable of making decisions on his/her own behalf.  5. Acute renal insufficiency: likely due to nectar thick liquids. Push po fluids. Will start IVF at nights to prevent further dehydration  -can  continue condom cath at night for now , but i want to begin timed voids in the am tomorrow..  6. A Fib: monitor with bid checks. Continue coreg, cardizem and digoxin for rate control. Blood thinner changed to xarelto today  7. ?hematuria: will recheck UA. Monitor for now.  8. DJD L-Knee: uric acid level at 4.2. Started on colchicine for ?gout flare. Will monitor to see if this helps symptoms  9. Hematuria

## 2012-08-28 ENCOUNTER — Inpatient Hospital Stay (HOSPITAL_COMMUNITY): Payer: Medicare Other | Admitting: Speech Pathology

## 2012-08-28 ENCOUNTER — Inpatient Hospital Stay (HOSPITAL_COMMUNITY): Payer: Medicare Other | Admitting: Physical Therapy

## 2012-08-28 ENCOUNTER — Inpatient Hospital Stay (HOSPITAL_COMMUNITY): Payer: Medicare Other

## 2012-08-28 DIAGNOSIS — I6340 Cerebral infarction due to embolism of unspecified cerebral artery: Secondary | ICD-10-CM

## 2012-08-28 DIAGNOSIS — Z5189 Encounter for other specified aftercare: Secondary | ICD-10-CM

## 2012-08-28 DIAGNOSIS — I69993 Ataxia following unspecified cerebrovascular disease: Secondary | ICD-10-CM

## 2012-08-28 LAB — CBC WITH DIFFERENTIAL/PLATELET
Basophils Absolute: 0.1 10*3/uL (ref 0.0–0.1)
Eosinophils Absolute: 0.1 10*3/uL (ref 0.0–0.7)
Eosinophils Relative: 2 % (ref 0–5)
HCT: 35.6 % — ABNORMAL LOW (ref 39.0–52.0)
Lymphs Abs: 1.2 10*3/uL (ref 0.7–4.0)
MCH: 33.2 pg (ref 26.0–34.0)
MCV: 97.8 fL (ref 78.0–100.0)
Monocytes Absolute: 1 10*3/uL (ref 0.1–1.0)
Platelets: 240 10*3/uL (ref 150–400)
RDW: 13.9 % (ref 11.5–15.5)

## 2012-08-28 LAB — COMPREHENSIVE METABOLIC PANEL
AST: 27 U/L (ref 0–37)
Albumin: 2.7 g/dL — ABNORMAL LOW (ref 3.5–5.2)
Alkaline Phosphatase: 96 U/L (ref 39–117)
Chloride: 100 mEq/L (ref 96–112)
Potassium: 4.3 mEq/L (ref 3.5–5.1)
Sodium: 137 mEq/L (ref 135–145)
Total Bilirubin: 0.3 mg/dL (ref 0.3–1.2)
Total Protein: 6.3 g/dL (ref 6.0–8.3)

## 2012-08-28 NOTE — Plan of Care (Signed)
Problem: RH BOWEL ELIMINATION Goal: RH STG MANAGE BOWEL W/MEDICATION W/ASSISTANCE STG Manage Bowel with Medication with Min Assistance.  Outcome: Progressing No BM since 08/25/12 Bisacodyl suppository today after therapy

## 2012-08-28 NOTE — Evaluation (Signed)
Recreational Therapy Assessment and Plan  Patient Details  Name: [name] MRN: [id number] Date of Birth: [birth year] Today's Date: 08/28/2012  Rehab Potential: Good ELOS: 10 days   Assessment Clinical Impression: Problem List:  Patient Active Problem List   Diagnosis   .  NEOPLASM, MALIGNANT, BLADDER   .  HYPERLIPIDEMIA   .  ANEMIA-NOS   .  Immune thrombocytopenic purpura   .  ANXIETY   .  HYPERTENSION   .  Atrial fibrillation with RVR   .  COPD   .  TRANSIENT ISCHEMIC ATTACK, HX OF   .  Lacunar stroke   .  Pleural effusion   .  Post-splenectomy   .  Long term current use of anticoagulant   .  CVA (cerebral vascular accident)-Acute right small nonhemorrhagic infarct seen on MRI   .  Encephalopathy   .  Left knee pain   .  Prerenal azotemia   .  UTI (lower urinary tract infection)   .  Leukocytosis   .  Pseudogout    Past Medical History:  Past Medical History   Diagnosis  Date   .  NEOPLASM, MALIGNANT, BLADDER  04/10/2009   .  HYPERLIPIDEMIA  04/10/2009   .  ANEMIA-NOS  04/10/2009   .  Immune thrombocytopenic purpura  04/10/2009   .  ANXIETY  04/10/2009   .  HYPERTENSION  04/10/2009   .  Atrial fibrillation  04/10/2009   .  COPD  04/10/2009   .  TRANSIENT ISCHEMIC ATTACK, HX OF  04/10/2009   .  Bladder cancer      Dr. Darvin Neighbours   .  Lacunar stroke  04/07/2011   .  Pleural effusion  04/07/2011   .  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  Splenectomy    .  Bilateral vats ablation    .  Facial cancer      facial skin cancer    Assessment & Plan  Clinical Impression: Patient is a 78 y.o. year old male with recent admission to the hospital with history of atrial fibrillation on chronic Coumadin therapy as well as history of TIA. Admitted a 2213 with slurred speech and altered mental status. MRI of the brain showed small nonhemorrhagic acute infarct extends from the posterior superior right lenticular nucleus and  to the posterior right corona radiata. Remote infarcts and small vessel disease type changes left corona radiata and right thalamus infarct. MRA of the head with intracranial atherosclerotic type changes. INR on admission of 1.7. Followup cardiology services for history of atrial fibrillation with rapid ventricular response. Patient on coreg, low dose digoxin added and Cardizem titrated for rate control. Echocardiogram recently completed as outpatient with his cardiologist showed EF 45-50%.Mild MR,moderate TR. 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 Patient did not receive TPA. Speech therapy followup for dysphagia and maintained on mechanical soft, nectar thick liquid diet. Complaints of left knee pain with x-rays completed 08/21/2012 showing negative for fracture or other acute changes there was some degenerative changes noted. Was started on colchicine due to suspicion of gout flare. Patient transferred to CIR on 08/25/2012 .   Patient presents with decreased activity tolerance, decreased functional mobility, decreased balance, decreased cognition, decreased safety limiting pt's independence with leisure/community pursuits.   Leisure History/Participation Premorbid leisure interest/current participation: Ashby Dawes - Therapist, music care;Community - Shopping mall;Community - Grocery store;Community - Travel (Comment);Ashby Dawes -  Vegetable gardening;Sports - Golf Other Leisure Interests: Television;Movies Leisure Participation Style: With Family/Friends Awareness of Community Resources: Good-identify 3 post discharge leisure resources Psychosocial / Spiritual Patient agreeable to Pet Therapy: Yes Does patient have pets?: No Social interaction - Mood/Behavior: Cooperative Film/video editor for Education?: Yes Patient Agreeable to Outing?: Yes Recreational Therapy Orientation Orientation -Reviewed with patient: Available activity  resources Strengths/Weaknesses Patient Strengths/Abilities: Willingness to participate;Active premorbidly Patient weaknesses: Physical limitations  Plan Rec Therapy Plan Is patient appropriate for Therapeutic Recreation?: Yes Rehab Potential: Good Treatment times per week: Min 1 time week >20 min] Estimated Length of Stay: 10 days TR Treatment/Interventions: Adaptive equipment instruction;1:1 session;Community reintegration;Balance/vestibular training;Functional mobility training;Patient/family education;Therapeutic activities  Recommendations for other services: None  Discharge Criteria: Patient will be discharged from TR if patient refuses treatment 3 consecutive times without medical reason.  If treatment goals not met, if there is a change in medical status, if patient makes no progress towards goals or if patient is discharged from hospital.  The above assessment, treatment plan, treatment alternatives and goals were discussed and mutually agreed upon: by patient  [name] 08/28/2012, 2:50 PM

## 2012-08-28 NOTE — Care Management Note (Signed)
[facility] Individual Statement of Services  Patient Name:  [name]  Date:  08/28/2012  Welcome to the [facility].  Our goal is to provide you with an individualized program based on your diagnosis and situation, designed to meet your specific needs.  With this comprehensive rehabilitation program, you will be expected to participate in at least 3 hours of rehabilitation therapies Monday-Friday, with modified therapy programming on the weekends.  Your rehabilitation program will include the following services:  Physical Therapy (PT), Occupational Therapy (OT), Speech Therapy (ST), 24 hour per day rehabilitation nursing, Therapeutic Recreaction (TR), Neuropsychology, Case Management (RN and Social Worker), Rehabilitation Medicine, Nutrition Services and Pharmacy Services  Weekly team conferences will be held on Wednesday  to discuss your progress.  Your RN Case Designer, television/film set will talk with you frequently to get your input and to update you on team discussions.  Team conferences with you and your family in attendance may also be held.  Expected length of stay: 7-10 days Overall anticipated outcome: supervision/min level  Depending on your progress and recovery, your program may change.  Your RN Case Estate agent will coordinate services and will keep you informed of any changes.  Your RN Sports coach and SW names and contact numbers are listed  below.  The following services may also be recommended but are not provided by the [facility]patient [facility]:   Driving Evaluations  Home Health Rehabiltiation Services  Outpatient Rehabilitatation [facility]  Vocational Rehabilitation   Arrangements will be made to provide these services after discharge if needed.  Arrangements include referral to agencies that provide these services.  Your insurance has been verified to be:  [employer]-Medicare Your primary doctor is:  Dr Oliver Barre  Pertinent information will be shared with your doctor and your insurance company.   Social Worker:  Dossie Der, [place] [phone]  Information discussed with and copy given to patient by: Lucy Chris, 08/28/2012, 9:11 AM

## 2012-08-28 NOTE — Progress Notes (Signed)
Speech Language Pathology Daily Session Note  Patient Details  Name: [name] MRN: [id number] Date of Birth: 11/03/1927  Today's Date: 08/28/2012 Time: 1415-1500 Time Calculation (min): 45 min  Short Term Goals: Week 1: SLP Short Term Goal 1 (Week 1): Pt will tolerate trials of regular consistency diet with nectar-thick liquids with min assist to follow precautions and maximize safety. SLP Short Term Goal 2 (Week 1): Pt will participate in lingual-pharyngeal strengthening exercises with min assist for execution. SLP Short Term Goal 3 (Week 1): Pt will use environmental aids to facilitate recall of new, pertinent information in order to participate in care with min assist.  Skilled Therapeutic Interventions: Treatment session focused on addressing dysphagia and recall of goals and safe swallow compensatory strategies.  SLP facilitated session with discussion to assess recall of goals and strategies that were addressed during Saturday's evaluation; SLP provided mod-max assist questions cues to facilitate recall of information.  Patient's wife present and verbalized understanding of information and stated that she felt comfortable supervising meals.  She also stated that patient was more fatigued this afternoon which could have been impacting his performance.  Patient would only agree to applesauce for p.o. trials during session and required min assist verbal cues to recall and follow strategies after earlier discussion.    FIM:  Comprehension Comprehension Mode: Auditory Comprehension: 5-Understands basic 90% of the time/requires cueing < 10% of the time Expression Expression Mode: Verbal Expression: 5-Expresses basic 90% of the time/requires cueing < 10% of the time. Social Interaction Social Interaction: 5-Interacts appropriately 90% of the time - Needs monitoring or encouragement for participation or interaction. Problem Solving Problem Solving: 5-Solves basic 90% of the time/requires  cueing < 10% of the time Memory Memory: 4-Recognizes or recalls 75 - 89% of the time/requires cueing 10 - 24% of the time FIM - Eating Eating Activity: 5: Supervision/cues;5: Set-up assist for open containers  Pain Pain Assessment Pain Assessment: No/denies pain  Therapy/Group: Individual Therapy  Charlane Ferretti., CCC-SLP [phone]  [name] 08/28/2012, 4:42 PM

## 2012-08-28 NOTE — Progress Notes (Signed)
Social Work Assessment and Plan Social Work Assessment and Plan  Patient Details  Name: [name] MRN: [id number] Date of Birth: 1927/12/21  Today's Date: 08/28/2012  Problem List:  Patient Active Problem List  Diagnosis  . NEOPLASM, MALIGNANT, BLADDER  . HYPERLIPIDEMIA  . ANEMIA-NOS  . Immune thrombocytopenic purpura  . ANXIETY  . HYPERTENSION  . Atrial fibrillation with RVR  . COPD  . TRANSIENT ISCHEMIC ATTACK, HX OF  . Lacunar stroke  . Pleural effusion  . Post-splenectomy  . Long term current use of anticoagulant  . CVA (cerebral vascular accident)-Acute right small nonhemorrhagic infarct seen on MRI  . Encephalopathy  . Left knee pain  . Prerenal azotemia  . UTI (lower urinary tract infection)  . Leukocytosis  . Pseudogout   Past Medical History:  Past Medical History  Diagnosis Date  . NEOPLASM, MALIGNANT, BLADDER 04/10/2009  . HYPERLIPIDEMIA 04/10/2009  . ANEMIA-NOS 04/10/2009  . 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 ANXIETY 04/10/2009  . HYPERTENSION 04/10/2009  . Atrial fibrillation 04/10/2009  . COPD 04/10/2009  . TRANSIENT ISCHEMIC ATTACK, HX OF 04/10/2009  . Bladder cancer     Dr. Darvin Neighbours  . Lacunar stroke 04/07/2011  . Pleural effusion 04/07/2011  . 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 Splenectomy   . Bilateral vats ablation   . Facial cancer     facial skin cancer   Social History:  reports that he has never smoked. He does not have any smokeless tobacco history on file. He reports that he does not drink alcohol. His drug history not on file.  Family / Support Systems Marital Status: Married Patient Roles: Spouse;Parent Spouse/Significant Other: Bettie [phone]-home  [phone]-cell ChildrenVincent Peyer  [phone]-home  [phone]-cell Other Supports: Friends Anticipated Caregiver: Wife Ability/Limitations of Caregiver: Wife is diabetic so needs to watch herself but is overall  in good health Caregiver Availability: 24/7 Family Dynamics: Both report a close tight knit family, see son as often as they can.  He does reside in [place] with is wife.  Both stay active and enjoy being outdoors.  Social History Preferred language: English Religion: Christian Cultural Background: No issues Education: High School Read: Yes Write: Yes Employment Status: Retired Fish farm manager Issues: No issues Guardian/Conservator: None-according to MD pt is capable of making his own decisions   Abuse/Neglect Physical Abuse: Denies Verbal Abuse: Denies Sexual Abuse: Denies Exploitation of patient/patient's resources: Denies Self-Neglect: Denies  Emotional Status Pt's affect, behavior adn adjustment status: Pt is able to expalin his stroke and deficits.  Wife reports the main issues are his swallow and balance but he has made progress already since being here.  Wife is very supportive and helpful in pt's care. Recent Psychosocial Issues: Other medical issues Pyschiatric History: No history-deferred depression screen both felt not necessary at this time.  Will continue to monitor his coping while here. Substance Abuse History: No issues  Patient / Family Perceptions, Expectations & Goals Pt/Family understanding of illness & functional limitations: Pt and wife have a good understanidng of his deficits and are motivated to [employer] while here.  His goal is to become independent and back to playing golf and working outside. Premorbid pt/family roles/activities: Husband, Father, Retiree, Eustaquio Maize, Jimmye Norman, [employer] member, etc Anticipated changes in roles/activities/participation: Would like to resume at discharge Pt/family expectations/goals: Pt states: " I want to do all that I can for myself."  Wife states: " He is very independent and active, always has  been."  [employer]: None Premorbid Home Care/DME Agencies: None Transportation available at  discharge: Wife Resource referrals recommended: Support group (specify) ([facility] Support Group)  Discharge Planning Living Arrangements: Spouse/significant other Support Systems: Spouse/significant other;Children;Friends/neighbors;Church/faith community Type of Residence: Private residence Insurance Resources: Media planner (specify) Investment banker, operational) Financial Resources: Restaurant manager, fast food Screen Referred: No Living Expenses: Lives with family Money Management: Patient;Spouse Do you have any problems obtaining your medications?: No Home Management: Both-wife the inside, pt outside Patient/Family Preliminary Plans: Return home with wife who can provide assistance, supervison/min level.  Wife is here daily and observing in therapies and is encouraged by pt's progress already.  Both aware it may take time to get back to where he was, but willing to work at it. Social Work Anticipated Follow Up Needs: HH/OP;Support Group DC Planning Additional Notes/Comments: Very supportive wife, should do well here.  Clinical Impression Very pleasant couple, pt is motivated to do what it takes to improve.  Supportive and committed wife who is willing to assist pt with his progress.  Both patient with pt's progress.  Lucy Chris 08/28/2012, 1:22 PM

## 2012-08-28 NOTE — Progress Notes (Signed)
Occupational Therapy Session Note  Patient Details  Name: [name] MRN: [id number] Date of Birth: 04/02/1927  Today's Date: 08/28/2012 Time: [id number]  (60 min)  1st session Time Calculation (min): 60 min               1300-1330   (30 min)   2nd session  Short Term Goals: Week 1:  OT Short Term Goal 1 (Week 1): Patient will increase bathing to Min Assist. OT Short Term Goal 2 (Week 1): Patient will increase dressing to MIn Assist. OT Short Term Goal 3 (Week 1): Patient will increase standing tolerance to 5 minutes while performing a table top activity to increase functional performance during grooming. OT Short Term Goal 4 (Week 1): Patient will increase gross and fine motor coordination to increase functional performance during LB dressing. OT Short Term Goal 5 (Week 1): Patient will increase toileting to Min Assist.  Skilled Therapeutic Interventions/Progress Updates:   ADL re-training performed in shower this AM. Session with focus on ADL performance, functional transfers, bathing and dressing, and standing balance. Pt. Ambulated to shower with HH assist and min. Assist. Patient stood for half of shower activities. Vc's to remind patient to sit when balance was off. Patient noted during dressing that he believed his balance has improved. Wife present during ADL.    Therapy Documentation Precautions:  Precautions Precautions: Fall Restrictions Weight Bearing Restrictions: No Pain:  none Pain Assessment Pain Assessment: No/denies pain  See FIM for current functional status  Therapy/Group: Individual Therapy  Limmie Patricia, OTR/L 08/28/2012, 12:23 PM

## 2012-08-28 NOTE — Progress Notes (Signed)
Patient ID: [name], male   DOB: [birth year], 78 y.o.   MRN: [id number] HPI: [name] is a 78 y.o. right-handed male with history of atrial fibrillation on chronic Coumadin therapy as well as history of TIA. Admitted a 2213 with slurred speech and altered mental status. MRI of the brain showed small nonhemorrhagic acute infarct extends from the posterior superior right lenticular nucleus and to the posterior right corona radiata. Remote infarcts and small vessel disease type changes left corona radiata and right thalamus infarct. MRA of the head with intracranial atherosclerotic type changes. INR on admission of 1.7. Followup cardiology services for history of atrial fibrillation with rapid ventricular response. Patient on coreg, low dose digoxin added and Cardizem titrated for rate control. Echocardiogram recently completed as outpatient with his cardiologist showed EF 45-50%.Mild MR,moderate TR. 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 Coumadin changed to Xarelto per family request Subjective/Complaints: No c/os Except constipation despite oral meds  Objective: Vital Signs: Blood pressure 117/78, pulse 90, temperature 98.2 F (36.8 C), temperature source Oral, resp. rate 18, height 5' 8.9" (1.75 m), weight 55.7 kg (122 lb 12.7 oz), SpO2 97.00%. No results found. Results for orders placed during the hospital encounter of 08/25/12 (from the past 72 hour(s))  URINALYSIS, ROUTINE W REFLEX MICROSCOPIC     Status: Abnormal   Collection Time   08/26/12  5:50 AM      Component Value Range Comment   Color, Urine YELLOW  YELLOW    APPearance CLEAR  CLEAR    Specific Gravity, Urine 1.012  1.005 - 1.030    pH 6.5  5.0 - 8.0    Glucose, UA NEGATIVE  NEGATIVE mg/dL    Hgb urine dipstick LARGE (*) NEGATIVE    Bilirubin Urine NEGATIVE  NEGATIVE    Ketones, ur NEGATIVE  NEGATIVE mg/dL    Protein, ur NEGATIVE  NEGATIVE mg/dL    Urobilinogen, UA 1.0  0.0  - 1.0 mg/dL    Nitrite NEGATIVE  NEGATIVE    Leukocytes, UA NEGATIVE  NEGATIVE   URINE CULTURE     Status: Normal   Collection Time   08/26/12  5:50 AM      Component Value Range Comment   Specimen Description URINE, CLEAN CATCH      Special Requests NONE      Culture  Setup Time 08/26/2012 11:22      Colony Count NO GROWTH      Culture NO GROWTH      Report Status 08/27/2012 FINAL     URINE MICROSCOPIC-ADD ON     Status: Normal   Collection Time   08/26/12  5:50 AM      Component Value Range Comment   WBC, UA 3-6  <3 WBC/hpf    RBC / HPF 21-50  <3 RBC/hpf    Bacteria, UA RARE  RARE   COMPREHENSIVE METABOLIC PANEL     Status: Abnormal   Collection Time   08/28/12  5:00 AM      Component Value Range Comment   Sodium 137  135 - 145 mEq/L    Potassium 4.3  3.5 - 5.1 mEq/L    Chloride 100  96 - 112 mEq/L    CO2 30  19 - 32 mEq/L    Glucose, Bld 91  70 - 99 mg/dL    BUN 30 (*) 6 - 23 mg/dL    Creatinine, Ser 4.09  0.50 - 1.35 mg/dL    Calcium 9.2  8.4 - 10.5 mg/dL    Total Protein 6.3  6.0 - 8.3 g/dL    Albumin 2.7 (*) 3.5 - 5.2 g/dL    AST 27  0 - 37 U/L    ALT 24  0 - 53 U/L    Alkaline Phosphatase 96  39 - 117 U/L    Total Bilirubin 0.3  0.3 - 1.2 mg/dL    GFR calc non Af Amer 66 (*) >90 mL/min    GFR calc Af Amer 76 (*) >90 mL/min   CBC WITH DIFFERENTIAL     Status: Abnormal   Collection Time   08/28/12  5:00 AM      Component Value Range Comment   WBC 6.6  4.0 - 10.5 K/uL    RBC 3.64 (*) 4.22 - 5.81 MIL/uL    Hemoglobin 12.1 (*) 13.0 - 17.0 g/dL    HCT 82.9 (*) 56.2 - 52.0 %    MCV 97.8  78.0 - 100.0 fL    MCH 33.2  26.0 - 34.0 pg    MCHC 34.0  30.0 - 36.0 g/dL    RDW 13.0  86.5 - 78.4 %    Platelets 240  150 - 400 K/uL    Neutrophils Relative 64  43 - 77 %    Neutro Abs 4.2  1.7 - 7.7 K/uL    Lymphocytes Relative 18  12 - 46 %    Lymphs Abs 1.2  0.7 - 4.0 K/uL    Monocytes Relative 15 (*) 3 - 12 %    Monocytes Absolute 1.0  0.1 - 1.0 K/uL    Eosinophils Relative  2  0 - 5 %    Eosinophils Absolute 0.1  0.0 - 0.7 K/uL    Basophils Relative 1  0 - 1 %    Basophils Absolute 0.1  0.0 - 0.1 K/uL      HEENT: normal Cardio: irregular Resp: CTA B/L GI: BS positive Extremity:  No Edema Skin:   Intact Neuro: Confused, Abnormal Motor 4/5 on L side and Abnormal FMC Ataxic/ dec FMC Musc/Skel:  Normal   Assessment/Plan: 1. Functional deficits secondary to R lenticular and R corona radiata infarct which require 3+ hours per day of interdisciplinary therapy in a comprehensive inpatient rehab setting. Physiatrist is providing close team supervision and 24 hour management of active medical problems listed below. Physiatrist and rehab team continue to assess barriers to discharge/monitor patient progress toward functional and medical goals. FIM: FIM - Bathing Bathing Steps Patient Completed: Chest;Right Arm;Left Arm;Abdomen;Front perineal area;Buttocks;Right upper leg;Left upper leg Bathing: 4: Steadying assist  FIM - Upper Body Dressing/Undressing Upper body dressing/undressing steps patient completed: Thread/unthread right sleeve of pullover shirt/dresss;Thread/unthread left sleeve of pullover shirt/dress;Put head through opening of pull over shirt/dress;Pull shirt over trunk Upper body dressing/undressing: 4: Min-Patient completed 75 plus % of tasks FIM - Lower Body Dressing/Undressing Lower body dressing/undressing steps patient completed: Thread/unthread right pants leg;Pull pants up/down;Don/Doff right sock;Don/Doff left sock;Don/Doff right shoe;Don/Doff left shoe Lower body dressing/undressing: 4: Min-Patient completed 75 plus % of tasks  FIM - Toileting Toileting steps completed by patient: Performs perineal hygiene Toileting Assistive Devices: Grab bar or rail for support Toileting: 4: Steadying assist  FIM - Diplomatic Services operational officer Devices: Elevated toilet seat Toilet Transfers: 4-To toilet/BSC: Min A (steadying Pt. >  75%);4-From toilet/BSC: Min A (steadying Pt. > 75%)  FIM - Bed/Chair Transfer Bed/Chair Transfer: 4: Bed > Chair or W/C: Min A (steadying Pt. > 75%);4: Chair or  W/C > Bed: Min A (steadying Pt. > 75%)  FIM - Locomotion: Wheelchair Locomotion: Wheelchair: 0: Activity did not occur FIM - Locomotion: Ambulation Ambulation/Gait Assistance: 4: Min assist Locomotion: Ambulation: 4: Travels 150 ft or more with minimal assistance (Pt.>75%)  Comprehension Comprehension Mode: Auditory Comprehension: 5-Follows basic conversation/direction: With no assist  Expression Expression Mode: Verbal Expression: 5-Expresses complex 90% of the time/cues < 10% of the time  Social Interaction Social Interaction: 5-Interacts appropriately 90% of the time - Needs monitoring or encouragement for participation or interaction.  Problem Solving Problem Solving: 5-Solves complex 90% of the time/cues < 10% of the time  Memory Memory: 4-Recognizes or recalls 75 - 89% of the time/requires cueing 10 - 24% of the time  Medical Problem List and Plan:  1. DVT Prophylaxis/Anticoagulation: Pharmaceutical: Xarelto  2. Pain Management: reports Left knee pain improved. Will monitor for now.  3. Mood: Will continue to monitor for stability. LCSW to follow up for formal evaluation.  4. Neuropsych: This patient is capable of making decisions on his/her own behalf.  5. Acute renal insufficiency: likely due to nectar thick liquids. Push po fluids. Will start IVF at nights to prevent further dehydration  -can continue condom cath at night for now , but i want to begin timed voids in the am tomorrow..  6. A Fib: monitor with bid checks. Continue coreg, cardizem and digoxin for rate control. Blood thinner changed to xarelto today  7. ?hematuria: will recheck UA. Monitor for now.  8. DJD L-Knee: uric acid level at 4.2. Started on colchicine for ?gout flare. Will monitor to see if this helps symptoms    LOS (Days) 3 A FACE TO  FACE EVALUATION WAS PERFORMED  Analeya Luallen E 08/28/2012, 11:06 AM

## 2012-08-28 NOTE — Progress Notes (Signed)
Physical Therapy Note  Patient Details  Name: [name] MRN: [id number] Date of Birth: 1927/02/18 Today's Date: 08/28/2012  13:00-13:45 individual therapy pt denied pain.  performed gait outside over uneven surfaces including curb, ramp, picking up objects from ground, and steps with 1 handrail with minguard assist. Pt required vc for safe foot placement on the steps and to use handrail if avaliable. pt with increased sway with fatigue, but not LOB.   Julian Reil 08/28/2012, 4:57 PM

## 2012-08-28 NOTE — Progress Notes (Signed)
Physical Therapy Session Note  Patient Details  Name: [name] MRN: [id number] Date of Birth: Aug 27, 1927  Today's Date: 08/28/2012 Time:  -     Short Term Goals: Week 1:  PT Short Term Goal 1 (Week 1): =LTGs  Skilled Therapeutic Interventions/Progress Updates:      Therapy Documentation Precautions:  Precautions Precautions: Fall       Pain:pt denied      Locomotion : gait without AD min assist including vertical and horizontal  head turns, pt with increased sway with both. Introduced cane after BERG. Pt had a difficult time understanding rationalization for the possible need for a  Cane.    Trunk/Postural Assessment :    Balance: Balance Balance Assessed: Yes Standardized Balance Assessment Standardized Balance Assessment: Berg Balance Test Berg Balance Test Sit to Stand: Able to stand without using hands and stabilize independently Standing Unsupported: Able to stand safely 2 minutes Sitting with Back Unsupported but Feet Supported on Floor or Stool: Able to sit safely and securely 2 minutes Stand to Sit: Sits safely with minimal use of hands Transfers: Able to transfer safely, minor use of hands Standing Unsupported with Eyes Closed: Able to stand 10 seconds safely Standing Ubsupported with Feet Together: Able to place feet together independently and stand 1 minute safely From Standing, Reach Forward with Outstretched Arm: Can reach forward >12 cm safely (5") From Standing Position, Pick up Object from Floor: Able to pick up shoe safely and easily From Standing Position, Turn to Look Behind Over each Shoulder: Looks behind one side only/other side shows less weight shift Turn 360 Degrees: Able to turn 360 degrees safely but slowly Standing Unsupported, Alternately Place Feet on Step/Stool: Able to complete >2 steps/needs minimal assist Standing Unsupported, One Foot in Front: Needs help to step but can hold 15 seconds Standing on One Leg: Tries to lift  leg/unable to hold 3 seconds but remains standing independently Total Score: 43        Therapy/Group: Individual Therapy  Julian Reil 08/28/2012, 11:14 AM

## 2012-08-28 NOTE — Plan of Care (Signed)
Problem: RH BLADDER ELIMINATION Goal: RH STG MANAGE BLADDER WITH EQUIPMENT WITH ASSISTANCE STG Manage Bladder With Equipment With Min Assistance  Outcome: Progressing Toilet every 3 hours and prn, condom cath at [facility]

## 2012-08-29 ENCOUNTER — Inpatient Hospital Stay (HOSPITAL_COMMUNITY): Payer: Medicare Other | Admitting: Speech Pathology

## 2012-08-29 ENCOUNTER — Inpatient Hospital Stay (HOSPITAL_COMMUNITY): Payer: Medicare Other | Admitting: Occupational Therapy

## 2012-08-29 ENCOUNTER — Inpatient Hospital Stay (HOSPITAL_COMMUNITY): Payer: Medicare Other

## 2012-08-29 DIAGNOSIS — I6340 Cerebral infarction due to embolism of unspecified cerebral artery: Secondary | ICD-10-CM

## 2012-08-29 DIAGNOSIS — Z5189 Encounter for other specified aftercare: Secondary | ICD-10-CM

## 2012-08-29 DIAGNOSIS — I4891 Unspecified atrial fibrillation: Secondary | ICD-10-CM

## 2012-08-29 MED ORDER — FLEET ENEMA 7-19 GM/118ML RE ENEM
1.0000 | ENEMA | Freq: Every day | RECTAL | Status: DC | PRN
  Filled 2012-08-29: qty 1

## 2012-08-29 MED ORDER — SENNOSIDES-DOCUSATE SODIUM 8.6-50 MG PO TABS: 2.0000 | ORAL_TABLET | Freq: Every evening | ORAL | Status: DC | PRN

## 2012-08-29 MED ORDER — CARVEDILOL 6.25 MG PO TABS
6.2500 mg | ORAL_TABLET | Freq: Two times a day (BID) | ORAL | Status: DC
  Administered 2012-08-29 – 2012-08-30 (×3): 6.25 mg via ORAL
  Filled 2012-08-29 (×7): qty 1

## 2012-08-29 NOTE — Progress Notes (Addendum)
Physical Therapy Session Note  Patient Details  Name: [name] MRN: [id number] Date of Birth: [birth year]  Today's Date: 08/29/2012 Time: 1300-1330 Time Calculation (min): 30 min  Short Term Goals: Week 1:  PT Short Term Goal 1 (Week 1): =LTGs  Skilled Therapeutic Interventions/Progress Updates:  Sit> stand and gait training with HHA initially, as pt was stiff and unsteady at beginning of session.  See trunk assessment below.  neuromuscular re-education via demo, tactile and verbal cues in sitting, for trunk activation ( thoracic extension/scapular adduction and lateral shortening/lengthening) with wt shifting to facilitate righting reactions; in standing on compliant surface, via forced use during bil calf raises, toe raises, and marching in place, with min assist for balance.  Therapeutic activity in dynamic standing, retrieving and transporting items with L or R hand , requiring turns, reaching to knee level and overhead, x 6 each, without LOB.  Gait x 160' with min assist, x 160' with min assist fading to supervision at end of session.  Pt did stumble on return trip to room, due to tripping over L toes, requiring mod assist to regain balance.  Up/down 5 steps with L rail ascending, alternating feet, with supervision, without cues.     Therapy Documentation Precautions:  Precautions Precautions: Fall Restrictions Weight Bearing Restrictions: No    Vital Signs: Therapy Vitals  Pain: Pain Assessment Pain Assessment: No/denies pain     Trunk/Postural Assessment : Pt's significant L lateral trunk shift, forward head, stooped shoulders and thoracic kyphosis impact his ability to quickly regain balance during gait, when fatigued.        See FIM for current functional status  Therapy/Group: Individual Therapy  [name] 08/29/2012, 5:06 PM

## 2012-08-29 NOTE — Progress Notes (Signed)
Speech Language Pathology Daily Session Note  Patient Details  Name: Cornellius Kropp MRN: [id number] Date of Birth: May 03, 1927  Today's Date: 08/29/2012 Time: 1200-1220 Time Calculation (min): 20 min  Short Term Goals: Week 1: SLP Short Term Goal 1 (Week 1): Pt will tolerate trials of regular consistency diet with nectar-thick liquids with min assist to follow precautions and maximize safety. SLP Short Term Goal 2 (Week 1): Pt will participate in lingual-pharyngeal strengthening exercises with min assist for execution. SLP Short Term Goal 3 (Week 1): Pt will use environmental aids to facilitate recall of new, pertinent information in order to participate in care with min assist.  Skilled Therapeutic Interventions: Co-treatment with OT; SLP facilitated session by targeting dysphagia goals during a self-feeding task.  SLP facilitated session with mod assist verbal and visual cues to initiate and problem solve with tray set up.  AS well as min assist verbal cues to recall and utilize safe swallow compensatory strategies while consuming Dys.3 textures and nectar-thick liquids via cup sip.  SLP's cues to consume small bites/sips, clear throat and swallow an extra time prevented any overt s/s of aspiration during p.o. intake.     FIM:  Comprehension Comprehension Mode: Auditory Comprehension: 5-Understands complex 90% of the time/Cues < 10% of the time Expression Expression Mode: Verbal Expression: 5-Expresses basic needs/ideas: With extra time/assistive device Social Interaction Social Interaction: 5-Interacts appropriately 90% of the time - Needs monitoring or encouragement for participation or interaction. Problem Solving Problem Solving: 3-Solves basic 50 - 74% of the time/requires cueing 25 - 49% of the time Memory Memory: 3-Recognizes or recalls 50 - 74% of the time/requires cueing 25 - 49% of the time FIM - Eating Eating Activity: 5: Supervision/cues  Pain Pain Assessment Pain  Assessment: No/denies pain  Therapy/Group: Group Therapy  Charlane Ferretti., CCC-SLP [phone]  Glady Ouderkirk 08/29/2012, 1:27 PM

## 2012-08-29 NOTE — Progress Notes (Signed)
Patient ID: [name], male   DOB: 07-20-1927, 78 y.o.   MRN: [id number] HPI: [name] is a 78 y.o. right-handed male with history of atrial fibrillation on chronic Coumadin therapy as well as history of TIA. Admitted a 2213 with slurred speech and altered mental status. MRI of the brain showed small nonhemorrhagic acute infarct extends from the posterior superior right lenticular nucleus and to the posterior right corona radiata. Remote infarcts and small vessel disease type changes left corona radiata and right thalamus infarct. MRA of the head with intracranial atherosclerotic type changes. INR on admission of 1.7. Followup cardiology services for history of atrial fibrillation with rapid ventricular response. Patient on coreg, low dose digoxin added and Cardizem titrated for rate control. Echocardiogram recently completed as outpatient with his cardiologist showed EF 45-50%.Mild MR,moderate TR. 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 Coumadin changed to Xarelto per family request Subjective/Complaints: No c/os Except constipation despite oral meds, partial result from dulcolax supp  Objective: Vital Signs: Blood pressure 118/82, pulse 102, temperature 97.5 F (36.4 C), temperature source Oral, resp. rate 19, height 5' 8.9" (1.75 m), weight 55.7 kg (122 lb 12.7 oz), SpO2 98.00%. No results found. Results for orders placed during the hospital encounter of 08/25/12 (from the past 72 hour(s))  COMPREHENSIVE METABOLIC PANEL     Status: Abnormal   Collection Time   08/28/12  5:00 AM      Component Value Range Comment   Sodium 137  135 - 145 mEq/L    Potassium 4.3  3.5 - 5.1 mEq/L    Chloride 100  96 - 112 mEq/L    CO2 30  19 - 32 mEq/L    Glucose, Bld 91  70 - 99 mg/dL    BUN 30 (*) 6 - 23 mg/dL    Creatinine, Ser 4.09  0.50 - 1.35 mg/dL    Calcium 9.2  8.4 - 81.1 mg/dL    Total Protein 6.3  6.0 - 8.3 g/dL    Albumin 2.7 (*) 3.5 - 5.2 g/dL      AST 27  0 - 37 U/L    ALT 24  0 - 53 U/L    Alkaline Phosphatase 96  39 - 117 U/L    Total Bilirubin 0.3  0.3 - 1.2 mg/dL    GFR calc non Af Amer 66 (*) >90 mL/min    GFR calc Af Amer 76 (*) >90 mL/min   CBC WITH DIFFERENTIAL     Status: Abnormal   Collection Time   08/28/12  5:00 AM      Component Value Range Comment   WBC 6.6  4.0 - 10.5 K/uL    RBC 3.64 (*) 4.22 - 5.81 MIL/uL    Hemoglobin 12.1 (*) 13.0 - 17.0 g/dL    HCT 91.4 (*) 78.2 - 52.0 %    MCV 97.8  78.0 - 100.0 fL    MCH 33.2  26.0 - 34.0 pg    MCHC 34.0  30.0 - 36.0 g/dL    RDW 95.6  21.3 - 08.6 %    Platelets 240  150 - 400 K/uL    Neutrophils Relative 64  43 - 77 %    Neutro Abs 4.2  1.7 - 7.7 K/uL    Lymphocytes Relative 18  12 - 46 %    Lymphs Abs 1.2  0.7 - 4.0 K/uL    Monocytes Relative 15 (*) 3 - 12 %    Monocytes  Absolute 1.0  0.1 - 1.0 K/uL    Eosinophils Relative 2  0 - 5 %    Eosinophils Absolute 0.1  0.0 - 0.7 K/uL    Basophils Relative 1  0 - 1 %    Basophils Absolute 0.1  0.0 - 0.1 K/uL      HEENT: normal Cardio: irregular Resp: CTA B/L GI: BS positive Extremity:  No Edema Skin:   Intact Neuro: Confused, Abnormal Motor 4/5 on L side and Abnormal FMC Ataxic/ dec FMC Musc/Skel:  Normal   Assessment/Plan: 1. Functional deficits secondary to R lenticular and R corona radiata infarct which require 3+ hours per day of interdisciplinary therapy in a comprehensive inpatient rehab setting. Physiatrist is providing close team supervision and 24 hour management of active medical problems listed below. Physiatrist and rehab team continue to assess barriers to discharge/monitor patient progress toward functional and medical goals. FIM: FIM - Bathing Bathing Steps Patient Completed: Chest;Right Arm;Left Arm;Abdomen;Front perineal area;Buttocks;Right upper leg;Left upper leg;Right lower leg (including foot);Left lower leg (including foot) Bathing: 5: Supervision: Safety issues/verbal cues  FIM - Upper  Body Dressing/Undressing Upper body dressing/undressing steps patient completed: Thread/unthread right sleeve of pullover shirt/dresss;Thread/unthread left sleeve of pullover shirt/dress;Put head through opening of pull over shirt/dress;Pull shirt over trunk Upper body dressing/undressing: 5: Supervision: Safety issues/verbal cues FIM - Lower Body Dressing/Undressing Lower body dressing/undressing steps patient completed: Thread/unthread right pants leg;Thread/unthread left pants leg;Pull pants up/down;Don/Doff right sock;Don/Doff left sock;Don/Doff right shoe;Don/Doff left shoe;Fasten/unfasten right shoe;Fasten/unfasten left shoe Lower body dressing/undressing: 5: Supervision: Safety issues/verbal cues  FIM - Toileting Toileting steps completed by patient: Adjust clothing prior to toileting;Performs perineal hygiene;Adjust clothing after toileting Toileting Assistive Devices: Grab bar or rail for support Toileting: 5: Supervision: Safety issues/verbal cues  FIM - Diplomatic Services operational officer Devices: Grab bars Toilet Transfers: 4-To toilet/BSC: Min A (steadying Pt. > 75%);4-From toilet/BSC: Min A (steadying Pt. > 75%)  FIM - Bed/Chair Transfer Bed/Chair Transfer Assistive Devices: Bed rails;Arm rests Bed/Chair Transfer: 4: Bed > Chair or W/C: Min A (steadying Pt. > 75%)  FIM - Locomotion: Wheelchair Locomotion: Wheelchair: 0: Activity did not occur FIM - Locomotion: Ambulation Ambulation/Gait Assistance: 4: Min assist Locomotion: Ambulation: 4: Travels 150 ft or more with minimal assistance (Pt.>75%)  Comprehension Comprehension Mode: Auditory Comprehension: 5-Understands complex 90% of the time/Cues < 10% of the time  Expression Expression Mode: Verbal Expression: 5-Expresses complex 90% of the time/cues < 10% of the time  Social Interaction Social Interaction: 5-Interacts appropriately 90% of the time - Needs monitoring or encouragement for participation or  interaction.  Problem Solving Problem Solving: 5-Solves complex 90% of the time/cues < 10% of the time  Memory Memory: 4-Recognizes or recalls 75 - 89% of the time/requires cueing 10 - 24% of the time  Medical Problem List and Plan:  1. DVT Prophylaxis/Anticoagulation: Pharmaceutical: Xarelto  2. Pain Management: reports Left knee pain improved. Will monitor for now.  3. Mood: Will continue to monitor for stability. LCSW to follow up for formal evaluation.  4. Neuropsych: This patient is capable of making decisions on his/her own behalf.  5. Acute renal insufficiency: likely due to nectar thick liquids. Push po fluids. Will start IVF at nights to prevent further dehydration  -can continue condom cath at night for now , but i want to begin timed voids in the am tomorrow..  6. A Fib: monitor with bid checks. Continue coreg, cardizem and digoxin for rate control. Blood thinner changed to [facility] Appreciate cardiology  f/u 7. ?hematuria: will recheck UA. Monitor for now.  8. DJD L-Knee: improved   LOS (Days) 4 A FACE TO FACE EVALUATION WAS PERFORMED  KIRSTEINS,ANDREW E 08/29/2012, 9:20 AM

## 2012-08-29 NOTE — Progress Notes (Signed)
IV infused without difficulty, swelling noted to right arm, IV D/C'd , warm packs applied, pt denies pain

## 2012-08-29 NOTE — Progress Notes (Signed)
INITIAL ADULT NUTRITION ASSESSMENT Date: 08/29/2012   Time: 12:01 PM  Reason for Assessment: Malnutrition Screening Tool and Consult  ASSESSMENT: Male 78 y.o.  Dx: CVA (cerebral vascular accident)  Hx:  Past Medical History  Diagnosis Date  . NEOPLASM, MALIGNANT, BLADDER 04/10/2009  . HYPERLIPIDEMIA 04/10/2009  . ANEMIA-NOS 04/10/2009  . 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 ANXIETY 04/10/2009  . HYPERTENSION 04/10/2009  . Atrial fibrillation 04/10/2009  . COPD 04/10/2009  . TRANSIENT ISCHEMIC ATTACK, HX OF 04/10/2009  . Bladder cancer     Dr. Darvin Neighbours  . Lacunar stroke 04/07/2011  . Pleural effusion 04/07/2011  . 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 Splenectomy   . Bilateral vats ablation   . Facial cancer     facial skin cancer   Related Meds:     . carvedilol  6.25 mg Oral BID WC  . digoxin  0.125 mg Oral Daily  . diltiazem  240 mg Oral Daily  . rivaroxaban  15 mg Oral Daily  . DISCONTD: carvedilol  3.125 mg Oral BID WC   Ht: 5' 8.9" (175 cm)  Wt: 122 lb 12.7 oz (55.7 kg)  Ideal Wt: 72.7 kg % Ideal Wt: 77%  Wt Readings from Last 15 Encounters:  08/26/12 122 lb 12.7 oz (55.7 kg)  08/25/12 122 lb 12.7 oz (55.7 kg)  04/07/11 134 lb 4 oz (60.895 kg)  04/10/09 132 lb (59.875 kg)  Usual Wt: 127 - 130 lb % Usual Wt: 96%  Body mass index is 18.19 kg/(m^2). Underweight BMI.  Food/Nutrition Related Hx: adequate and healthy intake PTA  Labs:  CMP     Component Value Date/Time   NA 137 08/28/2012 0500   K 4.3 08/28/2012 0500   CL 100 08/28/2012 0500   CO2 30 08/28/2012 0500   GLUCOSE 91 08/28/2012 0500   BUN 30* 08/28/2012 0500   CREATININE 1.03 08/28/2012 0500   CALCIUM 9.2 08/28/2012 0500   PROT 6.3 08/28/2012 0500   ALBUMIN 2.7* 08/28/2012 0500   AST 27 08/28/2012 0500   ALT 24 08/28/2012 0500   ALKPHOS 96 08/28/2012 0500   BILITOT 0.3 08/28/2012 0500   GFRNONAA 66* 08/28/2012 0500   GFRAA 76* 08/28/2012 0500    Intake/Output Summary (Last  24 hours) at 08/29/12 1204 Last data filed at 08/29/12 0900  Gross per 24 hour  Intake    840 ml  Output   1000 ml  Net   -160 ml   Diet Order: Dysphagia 3 with Nectar Thickened Liquids  Supplements/Tube Feeding: magic cups with meals  IVF:    sodium chloride Last Rate: 50 mL/hr at 08/28/12 1818    Estimated Nutritional Needs:   Kcal:  1400 - 1600 kcal Protein:  65 - 75 grams protein Fluid:  at least 1.5 liters daily  This RD is familiar with patient from acute hospitalization. Pt continues with underweight status, given BMI of 18.2, however noted pt with weight gain since initial admission. Wt is currently 122 lb. Wife reports that intake has been appropriate since initiation of diet. Pt does well with magic cups, declines any other type of supplement at this time.   Wife was educated during acute hospitalization regarding High Calorie and High Protein diets. Discussed food preferences with patient and nutrition services ambassador. Ambassador reviewed information with wife and discussed meal ordering system and appropriate diet choices. This RD discussed dietary choices with SLP.  NUTRITION DIAGNOSIS: -Increased nutrient needs (NI-5.1).  Status:  Ongoing  RELATED TO: need for weight gain and catabolic illness  AS EVIDENCE BY: estimated needs  MONITORING/EVALUATION(Goals): Goal: Pt to meet >/= 90% of their estimated nutrition needs Monitor: weight trends, lab trends, I/O's, PO intake, supplement tolerance  EDUCATION NEEDS: -Education needs addressed  INTERVENTION: 1. Continue current interventions, nutrition ambassador to adhere to patient's food preferences 2. RD to continue to follow nutrition care plan   DOCUMENTATION CODES Per approved criteria  -Underweight   Jarold Motto MS, RD, LDN Pager: [phone] After-hours pager: [phone]

## 2012-08-29 NOTE — Progress Notes (Signed)
Occupational Therapy Session Note  Patient Details  Name: [name] MRN: [id number] Date of Birth: Oct 22, 1927  Today's Date: 08/29/2012 Time: 1130-1200 Time Calculation (min): 30 min   Skilled Therapeutic Interventions/Progress Updates:    [place]: focus on self feeding managing safe swallowing precautions, activity tolerance, functional use of bilateral UE to setup   Therapy Documentation Precautions:  Precautions Precautions: Fall Restrictions Weight Bearing Restrictions: No Pain: Pain Assessment Pain Assessment: No/denies pain    Other Treatments:    See FIM for current functional status  Therapy/Group: Group Therapy  Adan Sis 08/29/2012, 2:30 PM

## 2012-08-29 NOTE — Progress Notes (Signed)
   Subjective:  Patient now on rehab. Has had a good weekend.  Denies any chest pain or dyspnea. No awareness of palpitations. No dizziness or syncope. No bleeding problems from xarelto.  Objective:  Vital Signs in the last 24 hours: Temp:  [97.5 F (36.4 C)-97.6 F (36.4 C)] 97.5 F (36.4 C) (09/03 0553) Pulse Rate:  [96-102] 102  (09/03 0553) Resp:  [18-19] 19  (09/03 0553) BP: (116-118)/(74-82) 118/82 mmHg (09/03 0553) SpO2:  [96 %-98 %] 98 % (09/03 0553)  Intake/Output from previous day: 09/02 0701 - 09/03 0700 In: 960 [P.O.:960] Out: 1350 [Urine:1350] Intake/Output from this shift:       . carvedilol  3.125 mg Oral BID WC  . digoxin  0.125 mg Oral Daily  . diltiazem  240 mg Oral Daily  . rivaroxaban  15 mg Oral Daily  . DISCONTD: colchicine  0.6 mg Oral Daily      . sodium chloride 50 mL/hr at 08/28/12 1818    Physical Exam: The patient appears to be in no distress.  Head and neck exam reveals that the pupils are equal and reactive.  The extraocular movements are full.  There is no scleral icterus.  Mouth and pharynx are benign.  No lymphadenopathy.  No carotid bruits.  The jugular venous pressure is normal.  Thyroid is not enlarged or tender.  Chest is clear to percussion and auscultation.  No rales or rhonchi.  Expansion of the chest is symmetrical.  Heart reveals no abnormal lift or heave.  First and second heart sounds are normal.  There is no murmur gallop rub or click.  The abdomen is soft and nontender.  Bowel sounds are normoactive.  There is no hepatosplenomegaly or mass.  There are no abdominal bruits.  Extremities reveal no phlebitis .Right forearm slightly swollen from previous IV. No cellulitis. Pedal pulses are good.  There is no cyanosis or clubbing.  Neurologic exam alert and oriented.  Integument reveals no rash  Lab Results:  Basename 08/28/12 0500  WBC 6.6  HGB 12.1*  PLT 240    Basename 08/28/12 0500  NA 137  K 4.3  CL 100  CO2  30  GLUCOSE 91  BUN 30*  CREATININE 1.03   No results found for this basename: TROPONINI:2,CK,MB:2 in the last 72 hours Hepatic Function Panel  Basename 08/28/12 0500  PROT 6.3  ALBUMIN 2.7*  AST 27  ALT 24  ALKPHOS 96  BILITOT 0.3  BILIDIR --  IBILI --   No results found for this basename: CHOL in the last 72 hours No results found for this basename: PROTIME in the last 72 hours  Imaging: Imaging results have been reviewed  Cardiac Studies:  Assessment/Plan:  Patient Active Hospital Problem List: * Atrial fibrillation with RVR (04/10/2009)   Assessment: Rate still a little fast at rest, around 100.   Plan: Will increase carvedilol to 6.25 mg BID.   LOS: 4 days    Cassell Clement 08/29/2012, 7:46 AM

## 2012-08-29 NOTE — Progress Notes (Signed)
Speech Language Pathology Daily Session Note  Patient Details  Name: [name] MRN: [id number] Date of Birth: 09/13/1927  Today's Date: 08/29/2012 Time: 1130-1200 Time Calculation (min): 30 min  Short Term Goals: Week 1: SLP Short Term Goal 1 (Week 1): Pt will tolerate trials of regular consistency diet with nectar-thick liquids with min assist to follow precautions and maximize safety. SLP Short Term Goal 2 (Week 1): Pt will participate in lingual-pharyngeal strengthening exercises with min assist for execution. SLP Short Term Goal 3 (Week 1): Pt will use environmental aids to facilitate recall of new, pertinent information in order to participate in care with min assist.  Skilled Therapeutic Interventions: Treatment session focused on addressing dysphagia and cognitive goals.  SLP facilitated session with max assist verbal, tactile and demonstration cues to perform pharyngeal strengthening exercises with use of written aid.  Patient unable to perform masako maneuver despite cues and SLP modified exercise to hard effortful swallows instead which patient was able to perform with mod assist cues.  SLP also facilitated session with trials of water sips via cup with mod assist cues to swallow hard and fast.  Patient demonstrated throat clears/coughs in about 50% of opportunities and consumed 4oz of water which indicates a functional improvement in sensation as compared to MBSS on 08/18/12. SLP suspects objective re-assessment may be warranted by end of the week.    FIM:  Comprehension Comprehension Mode: Auditory Comprehension: 5-Understands complex 90% of the time/Cues < 10% of the time Expression Expression Mode: Verbal Expression: 5-Expresses basic needs/ideas: With extra time/assistive device Social Interaction Social Interaction: 5-Interacts appropriately 90% of the time - Needs monitoring or encouragement for participation or interaction. Problem Solving Problem Solving: 3-Solves basic  50 - 74% of the time/requires cueing 25 - 49% of the time Memory Memory: 3-Recognizes or recalls 50 - 74% of the time/requires cueing 25 - 49% of the time FIM - Eating Eating Activity: 5: Supervision/cues  Pain Pain Assessment Pain Assessment: No/denies pain  Therapy/Group: Individual Therapy  Charlane Ferretti., CCC-SLP [phone]  [name] 08/29/2012, 2:31 PM

## 2012-08-29 NOTE — Progress Notes (Signed)
Occupational Therapy Session Note  Patient Details  Name: [name] MRN: [id number] Date of Birth: 08-18-1927  Today's Date: 08/29/2012 Time: 0700-08001000-1100  (60 min)  1st session Time Calculation (min): 60 min               1300-1330   (30 min)   2nd session  Short Term Goals: Week 1:  OT Short Term Goal 1 (Week 1): Patient will increase bathing to Min Assist. OT Short Term Goal 2 (Week 1): Patient will increase dressing to MIn Assist. OT Short Term Goal 3 (Week 1): Patient will increase standing tolerance to 5 minutes while performing a table top activity to increase functional performance during grooming. OT Short Term Goal 4 (Week 1): Patient will increase gross and fine motor coordination to increase functional performance during LB dressing. OT Short Term Goal 5 (Week 1): Patient will increase toileting to Min Assist.  Skilled Therapeutic Interventions/Progress Updates:   ADL re-training performed in shower this AM. Session with focus on ADL performance, functional transfers, bathing and dressing, and standing balance. Patient stood for half of shower activities. Vc's to remind patient to sit when balance was off. Was noted that towards the end of shower, patient stood with a posterior lean with back of legs against tub bench. Patient tired to have BM several times this AM without good results.     Therapy Documentation Precautions:  Precautions Precautions: Fall Restrictions Weight Bearing Restrictions: No Pain:  none Pain Assessment Pain Assessment: 0-10 Pain Score:   1 Pain Type: Other (Comment) (IV site) Pain Location: Arm Pain Orientation: Right;Distal Pain Descriptors: Burning Pain Frequency: Occasional Pain Intervention(s): Refused  See FIM for current functional status  Therapy/Group: Individual Therapy  Limmie Patricia, OTR/L 08/29/2012, 7:53 AM

## 2012-08-29 NOTE — Progress Notes (Signed)
Patient information reviewed and entered into [facility] system by Ziah Leandro, RN, CRRN, PPS Coordinator.  Information including medical coding and functional independence measure will be reviewed and updated through discharge.     Per nursing patient was given "Data Collection Information Summary for Patients in Inpatient Rehabilitation Facilities with attached "Privacy Act Statement-Health Care Records" upon admission.   

## 2012-08-30 ENCOUNTER — Inpatient Hospital Stay (HOSPITAL_COMMUNITY): Payer: Medicare Other | Admitting: Occupational Therapy

## 2012-08-30 ENCOUNTER — Inpatient Hospital Stay (HOSPITAL_COMMUNITY): Payer: Medicare Other | Admitting: *Deleted

## 2012-08-30 ENCOUNTER — Inpatient Hospital Stay (HOSPITAL_COMMUNITY): Payer: Medicare Other | Admitting: Speech Pathology

## 2012-08-30 ENCOUNTER — Inpatient Hospital Stay (HOSPITAL_COMMUNITY): Payer: Medicare Other

## 2012-08-30 ENCOUNTER — Inpatient Hospital Stay (HOSPITAL_COMMUNITY): Payer: Medicare Other | Admitting: Physical Therapy

## 2012-08-30 LAB — BASIC METABOLIC PANEL
CO2: 30 mEq/L (ref 19–32)
Calcium: 9.2 mg/dL (ref 8.4–10.5)
Creatinine, Ser: 1.09 mg/dL (ref 0.50–1.35)
GFR calc non Af Amer: 61 mL/min — ABNORMAL LOW (ref >90)
Glucose, Bld: 89 mg/dL (ref 70–99)
Sodium: 138 mEq/L (ref 135–145)

## 2012-08-30 NOTE — Progress Notes (Addendum)
Speech Language Pathology Daily Session Note  Patient Details  Name: [name] MRN: [id number] Date of Birth: Jan 27, 1927  Today's Date: 08/30/2012 Time: 1430-1450 Time Calculation (min): 20 min  Short Term Goals: Week 1: SLP Short Term Goal 1 (Week 1): Pt will tolerate trials of regular consistency diet with nectar-thick liquids with min assist to follow precautions and maximize safety. SLP Short Term Goal 2 (Week 1): Pt will participate in lingual-pharyngeal strengthening exercises with min assist for execution. SLP Short Term Goal 3 (Week 1): Pt will use environmental aids to facilitate recall of new, pertinent information in order to participate in care with min assist.  Skilled Therapeutic Interventions: Treatment session focused on addressing dysphagia and cognitive goals. SLP facilitated session with trials of water sips via cup with min assist cues to consume small sips with neutral head posture. Patient demonstrated throat clears/coughs in about 30% of opportunities and consumed 10oz of water which indicates a functional improvement in sensation as compared to MBSS on 08/18/12. SLP recommends and objective re-assessment tomorrow.    FIM:  Comprehension Comprehension Mode: Auditory Comprehension: 5-Follows basic conversation/direction: With extra time/assistive device Expression Expression Mode: Verbal Expression: 5-Expresses basic needs/ideas: With extra time/assistive device Social Interaction Social Interaction: 5-Interacts appropriately 90% of the time - Needs monitoring or encouragement for participation or interaction. Problem Solving Problem Solving: 5-Solves basic 90% of the time/requires cueing < 10% of the time Memory Memory: 5-Recognizes or recalls 90% of the time/requires cueing < 10% of the time FIM - Eating Eating Activity: 5: Supervision/cues  Pain Pain Assessment Pain Assessment: No/denies pain  Therapy/Group: Individual Therapy  Charlane Ferretti.,  CCC-SLP [phone]  [name] 08/30/2012, 4:57 PM

## 2012-08-30 NOTE — Plan of Care (Signed)
Problem: RH BOWEL ELIMINATION Goal: RH STG MANAGE BOWEL W/MEDICATION W/ASSISTANCE STG Manage Bowel with Medication with Min Assistance.  Outcome: Progressing Constipation 2 to medication and nectar thick liquid diet= use of sennokot prn  Problem: RH BLADDER ELIMINATION Goal: RH STG MANAGE BLADDER WITH EQUIPMENT WITH ASSISTANCE STG Manage Bladder With Equipment With Min Assistance  Outcome: Progressing Using condom cath at [facility], toilet during the day. Plan not to use the condom at night at home

## 2012-08-30 NOTE — Progress Notes (Signed)
Recreational Therapy Session Note  Patient Details  Name: [name] MRN: [id number] Date of Birth: 02/19/1927 Today's Date: 08/30/2012 Time:  [id number] Pain: no c/o Skilled Therapeutic Interventions/Progress Updates: Assisted pt with toileting, hand held assist for stand pivot transfer.  Supervision for toiletting.  Pt had bowel movement, RN made aware.  Discussed and encouraged community pursuits post discharge.  Therapy/Group: Individual Therapy  Leather Estis 08/30/2012, 12:15 PM

## 2012-08-30 NOTE — Progress Notes (Addendum)
Occupational Therapy Session Note  Patient Details  Name: [name] MRN: [id number] Date of Birth: 05/18/1927  Today's Date: 08/30/2012 Time: 0730-0830 Time Calculation (min): 60 min                Short Term Goals: Week 1:  OT Short Term Goal 1 (Week 1): Patient will increase bathing to Min Assist. OT Short Term Goal 2 (Week 1): Patient will increase dressing to MIn Assist. OT Short Term Goal 3 (Week 1): Patient will increase standing tolerance to 5 minutes while performing a table top activity to increase functional performance during grooming. OT Short Term Goal 4 (Week 1): Patient will increase gross and fine motor coordination to increase functional performance during LB dressing. OT Short Term Goal 5 (Week 1): Patient will increase toileting to Min Assist.  Skilled Therapeutic Interventions/Progress Updates:   ADL re-training performed in shower this AM. Session with focus on ADL performance, functional transfers, bathing and dressing, and standing balance. Patient still is determined to stand for majority of shower and is unaware that he leans back on tub bench. Patient still requires vc's to sit when balance was off. Patient stood with a posterior lean with back of legs against tub bench.     Therapy Documentation Precautions:  Precautions Precautions: Fall Restrictions Weight Bearing Restrictions: No Pain:  none Pain Assessment Pain Assessment: No/denies pain  See FIM for current functional status  Therapy/Group: Individual Therapy  Limmie Patricia, OTR/L 08/30/2012, 8:23 AM

## 2012-08-30 NOTE — Patient Care Conference (Signed)
Inpatient RehabilitationTeam Conference Note Date: 08/30/2012   Time: 10:45 AM    Patient Name: [name]      Medical Record Number: [id number]  Date of Birth: 1927-04-24 Sex: Male         Room/Bed: 4144/4144-01 Payor Info: Payor: Advertising copywriter MEDICARE  Plan: AARP MEDICARE COMPLETE  Product Type: *No Product type*     Admitting Diagnosis: CVA  Admit Date/Time:  08/25/2012  2:43 PM Admission Comments: No comment available   Primary Diagnosis:  CVA (cerebral vascular accident) Principal Problem: CVA (cerebral vascular accident)  Patient Active Problem List   Diagnosis Date Noted  . UTI (lower urinary tract infection) 08/22/2012  . Leukocytosis 08/22/2012  . Pseudogout 08/22/2012  . Encephalopathy 08/21/2012  . Left knee pain 08/21/2012  . Prerenal azotemia 08/21/2012  . CVA (cerebral vascular accident)-Acute right small nonhemorrhagic infarct seen on MRI 08/17/2012  . Lacunar stroke 04/07/2011  . Pleural effusion 04/07/2011  . 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 Long term current use of anticoagulant 04/07/2011  . NEOPLASM, MALIGNANT, BLADDER 04/10/2009  . HYPERLIPIDEMIA 04/10/2009  . ANEMIA-NOS 04/10/2009  . 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 ANXIETY 04/10/2009  . HYPERTENSION 04/10/2009  . Atrial fibrillation with RVR 04/10/2009  . COPD 04/10/2009  . TRANSIENT ISCHEMIC ATTACK, HX OF 04/10/2009    Expected Discharge Date: Expected Discharge Date: 09/02/12  Team Members Present: Physician: Dr. Claudette Laws Social Worker Present: Dossie Der, LCSW Nurse Present: Chana Bode, RN PT Present: Edman Circle, Judith Blonder, PTA OT Present: Bretta Bang, Verlene Mayer, OT SLP Present: Fae Pippin, SLP Other (Discipline and Name): Charolette Child Coordinator     Current Status/Progress Goal Weekly Team Focus  Medical   Dissipating and rehabilitation therapies, no evidence of bradycardia, fatigue set times  Revised sufficient rest breaks to improve  activity tolerance  Adjust schedule as needed   Bowel/Bladder   continent of bowel/urinary urgency incontinence managed with condom at [facility]  manage continent bowel and bladder without devices/toileting in bathroom  toileting during the day, wean condom use at [facility]   Swallow/Nutrition/ Hydration   Dys.3 textures and nectar-thick liquids   least restrictive p.o. intake  objective study 9/6   ADL's   Patient is currntly Supervision for bathing and dressing. Min Assist (Hand Held Assist ) for transfers.   Supervision overall. ELOS: 7-10 days.  functional transfers, cognitive defcitis, standing balance.   Mobility   min assist transfer and gait x 160'  supervision overall, including gait  x 150' and up/down 5 steps  gait training, neuro re-ed; posture, family ed   Communication             Safety/Cognition/ Behavioral Observations  mod assist   supervision   increase use of external aids to assist with recall; increase initiation and problem solving   Pain   n/a  n/a  n/a   Skin   n/a  n/a  n/a      *See Interdisciplinary Assessment and Plan and progress notes for long and short-term goals  Barriers to Discharge: Poor balance    Possible Resolutions to Barriers:  Address adaptive equipment as well as safety awareness and family training    Discharge Planning/Teaching Needs:  Home with wife who can provide 24 hour supervision.  Here daily and participating in therapies with pt      Team Discussion:  Questions regarding cardiac meds affecting fatigue and endurance.  Working on [employer] to hopefully change diet to thin liquids.  Wife here daily attending  therapies  Revisions to Treatment Plan:  None   Continued Need for Acute Rehabilitation Level of Care: The patient requires daily medical management by a physician with specialized training in physical medicine and rehabilitation for the following conditions: Daily direction of a multidisciplinary physical rehabilitation  program to ensure safe treatment while eliciting the highest outcome that is of practical value to the patient.: Yes Daily medical management of patient stability for increased activity during participation in an intensive rehabilitation regime.: Yes Daily analysis of laboratory values and/or radiology reports with any subsequent need for medication adjustment of medical intervention for : Cardiac problems;Neurological problems  Lucy Chris 08/31/2012, 8:46 AM

## 2012-08-30 NOTE — Progress Notes (Signed)
Occupational Therapy Session Note  Patient Details  Name: [name] MRN: [id number] Date of Birth: 1927-03-13  Today's Date: 08/30/2012 Time: 1030-1100 Time Calculation (min): 30 min  Short Term Goals: Week 1:  OT Short Term Goal 1 (Week 1): Patient will increase bathing to Min Assist. OT Short Term Goal 2 (Week 1): Patient will increase dressing to MIn Assist. OT Short Term Goal 3 (Week 1): Patient will increase standing tolerance to 5 minutes while performing a table top activity to increase functional performance during grooming. OT Short Term Goal 4 (Week 1): Patient will increase gross and fine motor coordination to increase functional performance during LB dressing. OT Short Term Goal 5 (Week 1): Patient will increase toileting to Min Assist.  Skilled Therapeutic Interventions/Progress Updates:  Patient ambulated to and from therapy gym with hand held assist. vc's to look up and ahead and to pick up feet when walking. -Patient participated in dynamic standing balance activity: Wii golf; 1 game of 3 holes; no LOB; vc's for technique. -Biodex; ball catch to focus on weightshift and increase standing balance; verbal and tactile cues for technique.  Therapy Documentation Precautions:  Precautions Precautions: Fall Restrictions Weight Bearing Restrictions: No Pain: Pain Assessment Pain Assessment: No/denies pain  See FIM for current functional status  Therapy/Group: Individual Therapy  Limmie Patricia, OTR/L 08/30/2012, 11:29 AM

## 2012-08-30 NOTE — Progress Notes (Addendum)
Speech Language Pathology Daily Session Note  Patient Details  Name: [name] MRN: [id number] Date of Birth: 24-Aug-1927  Today's Date: 08/30/2012 Time: 1130-1215 Time Calculation (min): 45 min  Short Term Goals: Week 1: SLP Short Term Goal 1 (Week 1): Pt will tolerate trials of regular consistency diet with nectar-thick liquids with min assist to follow precautions and maximize safety. SLP Short Term Goal 2 (Week 1): Pt will participate in lingual-pharyngeal strengthening exercises with min assist for execution. SLP Short Term Goal 3 (Week 1): Pt will use environmental aids to facilitate recall of new, pertinent information in order to participate in care with min assist.  Skilled Therapeutic Interventions: SLP facilitated session with occasional cues to consume small bites and sips as well as utilize extra swallow and intermittent throat clear with Dys.3 textures and nectar-thick liquids.  Re-assessing carryover of compensatory strategies SLP recommends change in supervision to intermittent assist during meals.   FIM:  Comprehension Comprehension Mode: Auditory Comprehension: 5-Follows basic conversation/direction: With extra time/assistive device Expression Expression Mode: Verbal Expression: 5-Expresses basic needs/ideas: With extra time/assistive device Social Interaction Social Interaction: 5-Interacts appropriately 90% of the time - Needs monitoring or encouragement for participation or interaction. Problem Solving Problem Solving: 5-Solves basic 90% of the time/requires cueing < 10% of the time Memory Memory: 5-Recognizes or recalls 90% of the time/requires cueing < 10% of the time FIM - Eating Eating Activity: 5: Supervision/cues  Pain Pain Assessment Pain Assessment: No/denies pain  Therapy/Group: Group Therapy  Charlane Ferretti., CCC-SLP [phone]  [name] 08/30/2012, 4:50 PM

## 2012-08-30 NOTE — Significant Event (Signed)
Vital sign check this afternoon revealed BP 94/45, p40, r 20, t 97.4 pt sitting in chair since therapy without distress.  Pam Love PA notified of findings and manual vital sign check BP 96/50, p 48, r 20, new order received for EKG and she will call cardiology to consult pt.  Labs ordered on pt as well. Pamelia Hoit

## 2012-08-30 NOTE — Progress Notes (Signed)
Physical Therapy Note  Patient Details  Name: [name] MRN: [id number] Date of Birth: [birth year] Today's Date: 08/30/2012  14:45-15:30 individual therapy pt denied pain.   Family education performed for gait, steps, balance, safety, inattention to left foot at times, and floor transfer. wife assisted on the steps and verbalized understanding of other activities.   Julian Reil 08/30/2012, 4:25 PM

## 2012-08-30 NOTE — Progress Notes (Signed)
   Subjective:  Feels well. No chest pain or dyspnea. No dizziness.  Objective:  Vital Signs in the last 24 hours: Temp:  [97.5 F (36.4 C)-97.9 F (36.6 C)] 97.9 F (36.6 C) (09/04 0500) Pulse Rate:  [75] 75  (09/04 0500) Resp:  [16-18] 18  (09/04 0500) BP: (118-137)/(78-81) 137/78 mmHg (09/04 0500) SpO2:  [98 %-99 %] 98 % (09/04 0500)  Intake/Output from previous day: 09/03 0701 - 09/04 0700 In: 1080 [P.O.:1080] Out: 800 [Urine:800] Intake/Output from this shift:       . carvedilol  6.25 mg Oral BID WC  . digoxin  0.125 mg Oral Daily  . diltiazem  240 mg Oral Daily  . rivaroxaban  15 mg Oral Daily  . DISCONTD: carvedilol  3.125 mg Oral BID WC      . sodium chloride 50 mL/hr at 08/29/12 1853    Physical Exam: The patient appears to be in no distress.  Head and neck exam reveals that the pupils are equal and reactive.  The extraocular movements are full.  There is no scleral icterus.  Mouth and pharynx are benign.  No lymphadenopathy.  No carotid bruits.  The jugular venous pressure is normal.  Thyroid is not enlarged or tender.  Chest is clear to percussion and auscultation.  No rales or rhonchi.  Expansion of the chest is symmetrical.  Heart reveals no abnormal lift or heave.  First and second heart sounds are normal.  There is no  gallop rub or click. Soft grade 1/6 systolic murmur at base.  The abdomen is soft and nontender.  Bowel sounds are normoactive.  There is no hepatosplenomegaly or mass.  There are no abdominal bruits.  Extremities reveal no phlebitis .Right forearm slightly swollen from previous IV. No cellulitis. Pedal pulses are good.  There is no cyanosis or clubbing.  Neurologic exam alert and oriented.  Integument reveals no rash  Lab Results:  Basename 08/28/12 0500  WBC 6.6  HGB 12.1*  PLT 240    Basename 08/28/12 0500  NA 137  K 4.3  CL 100  CO2 30  GLUCOSE 91  BUN 30*  CREATININE 1.03   No results found for this basename:  TROPONINI:2,CK,MB:2 in the last 72 hours Hepatic Function Panel  Basename 08/28/12 0500  PROT 6.3  ALBUMIN 2.7*  AST 27  ALT 24  ALKPHOS 96  BILITOT 0.3  BILIDIR --  IBILI --   No results found for this basename: CHOL in the last 72 hours No results found for this basename: PROTIME in the last 72 hours  Imaging: Imaging results have been reviewed  Cardiac Studies:  Assessment/Plan:  Patient Active Hospital Problem List: * Atrial fibrillation with RVR (04/10/2009)   Assessment: Rate improved    Plan: Will continue present cardiac meds.   LOS: 5 days    Cassell Clement 08/30/2012, 7:38 AM

## 2012-08-30 NOTE — Progress Notes (Signed)
Patient ID: [name], male   DOB: 01/12/1927, 78 y.o.   MRN: [id number] HPI: [name] is a 78 y.o. right-handed male with history of atrial fibrillation on chronic Coumadin therapy as well as history of TIA. Admitted a 2213 with slurred speech and altered mental status. MRI of the brain showed small nonhemorrhagic acute infarct extends from the posterior superior right lenticular nucleus and to the posterior right corona radiata. Remote infarcts and small vessel disease type changes left corona radiata and right thalamus infarct. MRA of the head with intracranial atherosclerotic type changes. INR on admission of 1.7. Followup cardiology services for history of atrial fibrillation with rapid ventricular response. Patient on coreg, low dose digoxin added and Cardizem titrated for rate control. Echocardiogram recently completed as outpatient with his cardiologist showed EF 45-50%.Mild MR,moderate TR. 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 Coumadin changed to Xarelto per family request Subjective/Complaints: No c/os BM this am!  Objective: Vital Signs: Blood pressure 137/78, pulse 75, temperature 97.9 F (36.6 C), temperature source Oral, resp. rate 18, height 5' 8.9" (1.75 m), weight 55.7 kg (122 lb 12.7 oz), SpO2 98.00%. No results found. Results for orders placed during the hospital encounter of 08/25/12 (from the past 72 hour(s))  COMPREHENSIVE METABOLIC PANEL     Status: Abnormal   Collection Time   08/28/12  5:00 AM      Component Value Range Comment   Sodium 137  135 - 145 mEq/L    Potassium 4.3  3.5 - 5.1 mEq/L    Chloride 100  96 - 112 mEq/L    CO2 30  19 - 32 mEq/L    Glucose, Bld 91  70 - 99 mg/dL    BUN 30 (*) 6 - 23 mg/dL    Creatinine, Ser 4.09  0.50 - 1.35 mg/dL    Calcium 9.2  8.4 - 81.1 mg/dL    Total Protein 6.3  6.0 - 8.3 g/dL    Albumin 2.7 (*) 3.5 - 5.2 g/dL    AST 27  0 - 37 U/L    ALT 24  0 - 53 U/L    Alkaline  Phosphatase 96  39 - 117 U/L    Total Bilirubin 0.3  0.3 - 1.2 mg/dL    GFR calc non Af Amer 66 (*) >90 mL/min    GFR calc Af Amer 76 (*) >90 mL/min   CBC WITH DIFFERENTIAL     Status: Abnormal   Collection Time   08/28/12  5:00 AM      Component Value Range Comment   WBC 6.6  4.0 - 10.5 K/uL    RBC 3.64 (*) 4.22 - 5.81 MIL/uL    Hemoglobin 12.1 (*) 13.0 - 17.0 g/dL    HCT 91.4 (*) 78.2 - 52.0 %    MCV 97.8  78.0 - 100.0 fL    MCH 33.2  26.0 - 34.0 pg    MCHC 34.0  30.0 - 36.0 g/dL    RDW 95.6  21.3 - 08.6 %    Platelets 240  150 - 400 K/uL    Neutrophils Relative 64  43 - 77 %    Neutro Abs 4.2  1.7 - 7.7 K/uL    Lymphocytes Relative 18  12 - 46 %    Lymphs Abs 1.2  0.7 - 4.0 K/uL    Monocytes Relative 15 (*) 3 - 12 %    Monocytes Absolute 1.0  0.1 - 1.0 K/uL  Eosinophils Relative 2  0 - 5 %    Eosinophils Absolute 0.1  0.0 - 0.7 K/uL    Basophils Relative 1  0 - 1 %    Basophils Absolute 0.1  0.0 - 0.1 K/uL   BASIC METABOLIC PANEL     Status: Abnormal   Collection Time   08/30/12  6:55 AM      Component Value Range Comment   Sodium 138  135 - 145 mEq/L    Potassium 4.6  3.5 - 5.1 mEq/L    Chloride 100  96 - 112 mEq/L    CO2 30  19 - 32 mEq/L    Glucose, Bld 89  70 - 99 mg/dL    BUN 27 (*) 6 - 23 mg/dL    Creatinine, Ser 0.98  0.50 - 1.35 mg/dL    Calcium 9.2  8.4 - 11.9 mg/dL    GFR calc non Af Amer 61 (*) >90 mL/min    GFR calc Af Amer 71 (*) >90 mL/min      HEENT: normal Cardio: irregular Resp: CTA B/L GI: BS positive Extremity:  No Edema Skin:   Intact Neuro: Confused, Abnormal Motor 4/5 on L side and Abnormal FMC Ataxic/ dec FMC Musc/Skel:  Normal   Assessment/Plan: 1. Functional deficits secondary to R lenticular and R corona radiata infarct which require 3+ hours per day of interdisciplinary therapy in a comprehensive inpatient rehab setting. Physiatrist is providing close team supervision and 24 hour management of active medical problems listed  below. Physiatrist and rehab team continue to assess barriers to discharge/monitor patient progress toward functional and medical goals. Team conference FIM: FIM - Bathing Bathing Steps Patient Completed: Chest;Right Arm;Left Arm;Abdomen;Front perineal area;Buttocks;Right upper leg;Left upper leg;Right lower leg (including foot);Left lower leg (including foot) Bathing: 5: Supervision: Safety issues/verbal cues  FIM - Upper Body Dressing/Undressing Upper body dressing/undressing steps patient completed: Thread/unthread right sleeve of pullover shirt/dresss;Thread/unthread left sleeve of pullover shirt/dress;Put head through opening of pull over shirt/dress;Pull shirt over trunk Upper body dressing/undressing: 5: Supervision: Safety issues/verbal cues FIM - Lower Body Dressing/Undressing Lower body dressing/undressing steps patient completed: Thread/unthread right pants leg;Thread/unthread left pants leg;Pull pants up/down;Don/Doff right sock;Don/Doff left sock;Don/Doff right shoe;Don/Doff left shoe;Fasten/unfasten right shoe;Fasten/unfasten left shoe Lower body dressing/undressing: 5: Supervision: Safety issues/verbal cues  FIM - Toileting Toileting steps completed by patient: Adjust clothing prior to toileting;Performs perineal hygiene;Adjust clothing after toileting Toileting Assistive Devices: Grab bar or rail for support Toileting: 5: Supervision: Safety issues/verbal cues  FIM - Diplomatic Services operational officer Devices: Grab bars Toilet Transfers: 4-To toilet/BSC: Min A (steadying Pt. > 75%);4-From toilet/BSC: Min A (steadying Pt. > 75%)  FIM - Bed/Chair Transfer Bed/Chair Transfer Assistive Devices: Bed rails;Arm rests Bed/Chair Transfer: 6: Supine > Sit: No assist;4: Bed > Chair or W/C: Min A (steadying Pt. > 75%)  FIM - Locomotion: Wheelchair Locomotion: Wheelchair: 0: Activity did not occur FIM - Locomotion: Ambulation Ambulation/Gait Assistance: 4: Min  assist Locomotion: Ambulation: 3: Travels 150 ft or more with moderate assistance (Pt: 50 - 74%) (LOB when fatigued)  Comprehension Comprehension Mode: Auditory Comprehension: 5-Understands complex 90% of the time/Cues < 10% of the time  Expression Expression Mode: Verbal Expression: 5-Expresses basic needs/ideas: With extra time/assistive device  Social Interaction Social Interaction: 5-Interacts appropriately 90% of the time - Needs monitoring or encouragement for participation or interaction.  Problem Solving Problem Solving: 3-Solves basic 50 - 74% of the time/requires cueing 25 - 49% of the time  Memory Memory: 3-Recognizes  or recalls 50 - 74% of the time/requires cueing 25 - 49% of the time  Medical Problem List and Plan:  1. DVT Prophylaxis/Anticoagulation: Pharmaceutical: Xarelto  2. Pain Management: reports Left knee pain improved. Will monitor for now.  3. Mood: Will continue to monitor for stability. LCSW to follow up for formal evaluation.  4. Neuropsych: This patient is capable of making decisions on his/her own behalf.  5. Acute renal insufficiency: likely due to nectar thick liquids. Push po fluids. Will start IVF at nights to prevent further dehydration  -can continue condom cath at night for now , but i want to begin timed voids in the am tomorrow..  6. A Fib: monitor with bid checks. Continue coreg, cardizem and digoxin for rate control. Blood thinner changed to xarelto Appreciate cardiology f/u 7. hematuria:likely chronic, hx bladder ca no sig blood loss , recent Ucx neg 8. DJD L-Knee: improved   LOS (Days) 5 A FACE TO FACE EVALUATION WAS PERFORMED  [name] 08/30/2012, 9:52 AM

## 2012-08-31 ENCOUNTER — Inpatient Hospital Stay (HOSPITAL_COMMUNITY): Payer: Medicare Other

## 2012-08-31 ENCOUNTER — Inpatient Hospital Stay (HOSPITAL_COMMUNITY): Payer: Medicare Other | Admitting: Speech Pathology

## 2012-08-31 ENCOUNTER — Inpatient Hospital Stay (HOSPITAL_COMMUNITY): Payer: Medicare Other | Admitting: *Deleted

## 2012-08-31 ENCOUNTER — Encounter (HOSPITAL_COMMUNITY): Payer: Medicare Other | Admitting: *Deleted

## 2012-08-31 LAB — DIGOXIN LEVEL: Digoxin Level: 0.4 ng/mL — ABNORMAL LOW (ref 0.8–2.0)

## 2012-08-31 MED ORDER — SENNOSIDES-DOCUSATE SODIUM 8.6-50 MG PO TABS
1.0000 | ORAL_TABLET | Freq: Every day | ORAL | Status: DC
  Administered 2012-08-31 – 2012-09-01 (×2): 1 via ORAL
  Filled 2012-08-31 (×3): qty 1

## 2012-08-31 NOTE — Progress Notes (Signed)
Speech Language Pathology Daily Session Note  Patient Details  Name: [name] MRN: [id number] Date of Birth: 1927-12-25  Today's Date: 08/31/2012 Time: 1150-1220 Time Calculation (min): 30 min  Short Term Goals: Week 1: SLP Short Term Goal 1 (Week 1): Pt will tolerate trials of regular consistency diet with nectar-thick liquids with min assist to follow precautions and maximize safety. SLP Short Term Goal 1 - Progress (Week 1): Met SLP Short Term Goal 2 (Week 1): Pt will participate in lingual-pharyngeal strengthening exercises with min assist for execution. SLP Short Term Goal 3 (Week 1): Pt will use environmental aids to facilitate recall of new, pertinent information in order to participate in care with min assist. SLP Short Term Goal 4 (Week 1): Pt will tolerate a regular consistency diet with thin liquids with supervision level cues to follow precautions and maximize safety.  Skilled Therapeutic Interventions: Co-treatment with OT; SLP facilitated session with supervision level verbal cues to perform hard effortful cough instead of a throat clear as an airway protections strategy.  Patient consumed regular textures and thin liquids via cup with no overt s/s of aspiration throughout meal.     Pain Pain Assessment Pain Assessment: No/denies pain  Therapy/Group: Group Therapy  Charlane Ferretti., CCC-SLP [phone]  Kenleigh Toback 08/31/2012, 3:05 PM

## 2012-08-31 NOTE — Progress Notes (Signed)
Social Work Patient ID: [name], male   DOB: 25-May-1927, 78 y.o.   MRN: [id number] Met with pt and wife to inform team conference goals-supervision and discharge date 9/7.  Both very pleased with him passing his swallow Test this am.  Family education has begun and wife reports harder than she thought, she will be here tomorrow to go through more to feel Comfortable when they are home.  Will begin with home health therapies and transition to OP therapies.  No DME at this time, check back with tomorrow.

## 2012-08-31 NOTE — Progress Notes (Signed)
Recreational Therapy Session Note  Patient Details  Name: [name] MRN: [id number] Date of Birth: Jul 18, 1927 Today's Date: 08/31/2012  Pt scheduled to participate in community reintegration/outing today.  Outing cancelled due to medical issues and need for monitoring per Marissa Nestle, PA-C.  Pt missed 90 minutes of scheduled therapy.  [name] 08/31/2012, 9:50 AM

## 2012-08-31 NOTE — Progress Notes (Signed)
Occupational Therapy Session Note  Patient Details  Name: [name] MRN: [id number] Date of Birth: 1927/04/30  Today's Date: 08/31/2012 Time: 0730-0830 Time Calculation (min): 60 min            Short Term Goals: Week 1:  OT Short Term Goal 1 (Week 1): Patient will increase bathing to Min Assist. OT Short Term Goal 2 (Week 1): Patient will increase dressing to MIn Assist. OT Short Term Goal 3 (Week 1): Patient will increase standing tolerance to 5 minutes while performing a table top activity to increase functional performance during grooming. OT Short Term Goal 4 (Week 1): Patient will increase gross and fine motor coordination to increase functional performance during LB dressing. OT Short Term Goal 5 (Week 1): Patient will increase toileting to Min Assist.  Skilled Therapeutic Interventions/Progress Updates:   ADL re-training performed in shower this AM. Session with focus on ADL performance, functional transfers, bathing and dressing, and standing balance. Patient still is determined to stand for majority of shower and is unaware that he leans back on tub bench. Patient still requires vc's to sit when balance was off. Patient stood with a posterior lean with back of legs against tub bench. Patient did ambulate from bed to bathroom at Supervision level with no device. Tomorrow is patient's grad day so we will do the shower in the tub/shower combo with a shower seat. Spoke with patient's wife about shower safety and recommended a shower seat and grab bar for tub. Also educated wife on patient's performance in shower and his need for supervision during showers. Wife verbalized understanding.    Therapy Documentation Precautions:  Precautions Precautions: Fall Restrictions Weight Bearing Restrictions: No Pain:  none Pain Assessment Pain Assessment: No/denies pain Pain Score: 0-No pain  See FIM for current functional status  Therapy/Group: Individual Therapy  Limmie Patricia,  OTR/L 08/31/2012, 11:29 AM

## 2012-08-31 NOTE — Progress Notes (Signed)
Physical Therapy Session Note  Patient Details  Name: [name] MRN: [id number] Date of Birth: [birth year]  Today's Date: 08/31/2012 Time: 0900-0930 Time Calculation (min): 30 min  Short Term Goals: Week 1:  PT Short Term Goal 1 (Week 1): =LTGs   Skilled Therapeutic Interventions/Progress Updates:   Upon entering room, therapist found pt in BR, attempting to stand up from w/c,  with wife attempting to assist to toilet.  Stand/pivot to L onto commode chair over toilet, with max assist to prevent fall.  Pt impulsive, with bowel urgency.  Dynamic standing balance for clothing mgt, and sitting balance for hygiene,  with supervision.  Discussed at length with wife and pt : BR set up at home, use of wall bars, etc.  Pt reported that he gets OOB usually 6x/night to urinate.  This situation has been going on since his bladder cancer.  Wife and pt sleep in separate bedrooms so wife is unaware when he gets OOB.  Therapist advised pt and wife that he use a hand-held urinal to reduce risk of falls.  Wife in favor; pt reluctantly stated he would give it a try.  Pt will see his urologist in about 1 month.  Gait training without AD x 100' with supervision, including turns, without LOB.  Family ed for sit>< stand, gait in home setting to bed, chair, recliner, involving turns to L and R.  Pt tends to approach each surface biased towards his R, turning all the way around if necessary to sit.  Wife observed and understood this and safely cued pt to turn to L when appropriate.    Therapist discussed frequent night time urination/ need for safety at home with PA, OT.  Recommend HHPT for home safety and family ed in home environment,  transitioning to OPPT for strengthening, balance, activity tolerance.  Therapy Documentation Precautions:  Precautions Precautions: Fall Restrictions Weight Bearing Restrictions: No   Vital Signs: Therapy Vitals Pulse Rate: 112  BP: 106/66 mmHg Patient Position, if  appropriate: Standing Pain: Pain Assessment Pain Assessment: No/denies pain Pain Score: 0-No pain       See FIM for current functional status  Therapy/Group: Individual Therapy  Declan Adamson 08/31/2012, 10:49 AM

## 2012-08-31 NOTE — Progress Notes (Signed)
Patient ID: [name], male   DOB: November 06, 1927, 78 y.o.   MRN: [id number] HPI: [name] is a 78 y.o. right-handed male with history of atrial fibrillation on chronic Coumadin therapy as well as history of TIA. Admitted a 2213 with slurred speech and altered mental status. MRI of the brain showed small nonhemorrhagic acute infarct extends from the posterior superior right lenticular nucleus and to the posterior right corona radiata. Remote infarcts and small vessel disease type changes left corona radiata and right thalamus infarct. MRA of the head with intracranial atherosclerotic type changes. INR on admission of 1.7. Followup cardiology services for history of atrial fibrillation with rapid ventricular response. Patient on coreg, low dose digoxin added and Cardizem titrated for rate control. Echocardiogram recently completed as outpatient with his cardiologist showed EF 45-50%.Mild MR,moderate TR. 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 Coumadin changed to Xarelto per family request Subjective/Complaints: 100% meals Bradycardia and hypotension episode.  Started on  Coreg 9/3  Objective: Vital Signs: Blood pressure 121/73, pulse 64, temperature 98.2 F (36.8 C), temperature source Oral, resp. rate 18, height 5' 8.9" (1.75 m), weight 57.4 kg (126 lb 8.7 oz), SpO2 94.00%. No results found. Results for orders placed during the hospital encounter of 08/25/12 (from the past 72 hour(s))  BASIC METABOLIC PANEL     Status: Abnormal   Collection Time   08/30/12  6:55 AM      Component Value Range Comment   Sodium 138  135 - 145 mEq/L    Potassium 4.6  3.5 - 5.1 mEq/L    Chloride 100  96 - 112 mEq/L    CO2 30  19 - 32 mEq/L    Glucose, Bld 89  70 - 99 mg/dL    BUN 27 (*) 6 - 23 mg/dL    Creatinine, Ser 4.09  0.50 - 1.35 mg/dL    Calcium 9.2  8.4 - 81.1 mg/dL    GFR calc non Af Amer 61 (*) >90 mL/min    GFR calc Af Amer 71 (*) >90 mL/min   DIGOXIN  LEVEL     Status: Normal   Collection Time   08/30/12  4:46 PM      Component Value Range Comment   Digoxin Level 0.8  0.8 - 2.0 ng/mL      HEENT: normal Cardio: irregular Resp: CTA B/L GI: BS positive Extremity:  No Edema Skin:   Intact Neuro: Confused, Abnormal Motor 4/5 on L side and Abnormal FMC Ataxic/ dec FMC Musc/Skel:  Normal   Assessment/Plan: 1. Functional deficits secondary to R lenticular and R corona radiata infarct which require 3+ hours per day of interdisciplinary therapy in a comprehensive inpatient rehab setting. Physiatrist is providing close team supervision and 24 hour management of active medical problems listed below. Physiatrist and rehab team continue to assess barriers to discharge/monitor patient progress toward functional and medical goals. Team conference FIM: FIM - Bathing Bathing Steps Patient Completed: Chest;Right Arm;Left Arm;Abdomen;Front perineal area;Buttocks;Right upper leg;Left upper leg;Right lower leg (including foot);Left lower leg (including foot) Bathing: 5: Supervision: Safety issues/verbal cues  FIM - Upper Body Dressing/Undressing Upper body dressing/undressing steps patient completed: Thread/unthread right sleeve of pullover shirt/dresss;Thread/unthread left sleeve of pullover shirt/dress;Put head through opening of pull over shirt/dress;Pull shirt over trunk Upper body dressing/undressing: 5: Supervision: Safety issues/verbal cues FIM - Lower Body Dressing/Undressing Lower body dressing/undressing steps patient completed: Thread/unthread right pants leg;Thread/unthread left pants leg;Pull pants up/down;Don/Doff right sock;Don/Doff left sock;Don/Doff right shoe;Don/Doff  left shoe;Fasten/unfasten right shoe;Fasten/unfasten left shoe Lower body dressing/undressing: 5: Supervision: Safety issues/verbal cues  FIM - Toileting Toileting steps completed by patient: Adjust clothing prior to toileting;Performs perineal hygiene;Adjust clothing  after toileting Toileting Assistive Devices: Grab bar or rail for support Toileting: 5: Supervision: Safety issues/verbal cues  FIM - Diplomatic Services operational officer Devices: Grab bars Toilet Transfers: 4-To toilet/BSC: Min A (steadying Pt. > 75%)  FIM - Bed/Chair Transfer Bed/Chair Transfer Assistive Devices: Bed rails;Arm rests Bed/Chair Transfer: 6: Supine > Sit: No assist;4: Supine > Sit: Min A (steadying Pt. > 75%/lift 1 leg)  FIM - Locomotion: Wheelchair Locomotion: Wheelchair: 0: Activity did not occur FIM - Locomotion: Ambulation Ambulation/Gait Assistance: 4: Min assist Locomotion: Ambulation: 3: Travels 150 ft or more with moderate assistance (Pt: 50 - 74%) (LOB when fatigued)  Comprehension Comprehension Mode: Auditory Comprehension: 5-Follows basic conversation/direction: With extra time/assistive device  Expression Expression Mode: Verbal Expression: 5-Expresses basic needs/ideas: With extra time/assistive device  Social Interaction Social Interaction: 5-Interacts appropriately 90% of the time - Needs monitoring or encouragement for participation or interaction.  Problem Solving Problem Solving: 5-Solves basic 90% of the time/requires cueing < 10% of the time  Memory Memory: 5-Recognizes or recalls 90% of the time/requires cueing < 10% of the time  Medical Problem List and Plan:  1. DVT Prophylaxis/Anticoagulation: Pharmaceutical: Xarelto  2. Pain Management: reports Left knee pain improved. Will monitor for now.  3. Mood: Will continue to monitor for stability. LCSW to follow up for formal evaluation.  4. Neuropsych: This patient is capable of making decisions on his/her own behalf.  5. Acute renal insufficiency: likely due to nectar thick liquids. Push po fluids. Will start IVF at nights to prevent further dehydration  -can continue condom cath at night for now , but i want to begin timed voids in the am tomorrow..  6. A Fib: monitor with bid  checks. Continue  digoxin for rate control. Blood thinner changed to xarelto ask cardiology to  f/u 7. hematuria:likely chronic, hx bladder ca no sig blood loss , recent Ucx neg 8. DJD L-Knee: improved   LOS (Days) 6 A FACE TO FACE EVALUATION WAS PERFORMED  Kendall Justo E 08/31/2012, 8:30 AM

## 2012-08-31 NOTE — Progress Notes (Signed)
Occupational Therapy Session Note [employer]  Patient Details  Name: [name] MRN: [id number] Date of Birth: September 12, 1927  Today's Date: 08/31/2012 Time: 1130-1150 Time Calculation (min): 20 min   Skilled Therapeutic Interventions/Progress Updates: Patient participated in[employer] to addrees his ability to self manage upgraded consistency diet.  Patient required intermittent cueing to clear throat and cough as needed during meal.  Patient needed cueing to recall recommendation made by SLP regarding no use of straws.  Patient will no longer need to participate in this group as he demonstrates safe performance with self feeding.       Therapy Documentation Precautions:  Precautions Precautions: Fall Restrictions Weight Bearing Restrictions: No     Pain: Pain Assessment Pain Assessment: No/denies pain  See FIM for current functional status  Therapy/Group: Group Therapy  Collier Salina 08/31/2012, 3:58 PM

## 2012-08-31 NOTE — Procedures (Signed)
Objective Swallowing Evaluation:    Patient Details  Name: [name] MRN: [id number] Date of Birth: 1927/06/22  Today's Date: 08/31/2012 Time: 0900-0930 Time Calculation (min): 30 min  Past Medical History:  Past Medical History  Diagnosis Date  . NEOPLASM, MALIGNANT, BLADDER 04/10/2009  . HYPERLIPIDEMIA 04/10/2009  . ANEMIA-NOS 04/10/2009  . 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 ANXIETY 04/10/2009  . HYPERTENSION 04/10/2009  . Atrial fibrillation 04/10/2009  . COPD 04/10/2009  . TRANSIENT ISCHEMIC ATTACK, HX OF 04/10/2009  . Bladder cancer     Dr. Darvin Neighbours  . Lacunar stroke 04/07/2011  . Pleural effusion 04/07/2011  . 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 Splenectomy   . Bilateral vats ablation   . Facial cancer     facial skin cancer   HPI:  78 year old with PMH significant for A fib on chronic Coumadin, prior history of stroke, ho bladder cancer, who presents to ED accompanied by wife due to slurred speech, drooling and episode of incontinence. MRI shows small right CVA. Patient MBSS 08/18/12 with recommendation of Dys.3 textures and nectar-thick liquids with safe swallow compensatory strategies.  Patient transferred to [facility] 08/25/12 and objective assessment warranted today to re-assess presence of silent penetration and aspiration prior to discharge on 09/02/12.       Recommendation/Prognosis  Clinical Impression: Mr. Costanzo demonstrated a functional improvement as compared to his previous objective study.  Patient presents with a mild oropharyngeal dysphagia with sensory/motor deficits leading to a delay in swallow initiation, premature spillage and penetration of both nectar-thick and thin liquids due to decreased coordination and head posture.  Patient exhibited a tendency to perform chin tuck/head down posture, which increased penetration during trials.  Penetration occurred before swallow as well as after due to  vallecular and pyriform sinus residue.  With use of safe swallow compensatory strategies (sitting up, small sips, no straw, use of an extra swallow and hard cough intermittently) patient was able to clear intermittent, sensed penetration of thin liquids.  Silent aspiration of thin liquids occurred during pill administration. As a result it is recommended that this patient consume regular textures and thin liquids via cup with medication administered whole in puree.  With consistent use of safe swallow compensatory strategies patient's aspiration risk is mild.  Dysphagia Diagnosis: Mild pharyngeal phase dysphagia Swallow Evaluation Recommendations Diet Recommendations: Regular;Thin liquid Liquid Administration via: Cup;No straw Medication Administration: Whole meds with puree Supervision: Patient able to self feed;Intermittent supervision to cue for compensatory strategies;Trained caregiver to feed patient Compensations: Slow rate;Small sips/bites;Multiple dry swallows after each bite/sip;Hard cough after swallow Postural Changes and/or Swallow Maneuvers: Out of bed for meals;Seated upright 90 degrees Oral Care Recommendations: Oral care BID;Patient independent with oral care Follow up Recommendations: Home health SLP;Outpatient SLP Prognosis Prognosis for Safe Diet Advancement: Good Individuals Consulted Consulted and Agree with Results and Recommendations: Patient;Family member/caregiver Family Member Consulted: wife  SLP Assessment/Plan Dysphagia Diagnosis: Mild pharyngeal phase dysphagia  Short Term Goals: Week 1: SLP Short Term Goal 1 (Week 1): Pt will tolerate trials of regular consistency diet with nectar-thick liquids with min assist to follow precautions and maximize safety. SLP Short Term Goal 1 - Progress (Week 1): Met SLP Short Term Goal 2 (Week 1): Pt will participate in lingual-pharyngeal strengthening exercises with min assist for execution. SLP Short Term Goal 3 (Week 1): Pt  will use environmental aids to facilitate recall of new, pertinent information in order to participate in  care with min assist. SLP Short Term Goal 4 (Week 1): Pt will tolerate a regular consistency diet with thin liquids with supervision level cues to follow precautions and maximize safety.  General:  Date of Onset: 08/17/12 HPI: 78 year old with PMH significant for A fib on chronic Coumadin, prior history of stroke, ho bladder cancer, who presents to ED accompanied by wife due to slurred speech, drooling and episode of incontinence. MRI shows small right CVA. Patient MBSS 08/18/12 with recommendation of Dys.3 textures and nectar-thick liquids with safe swallow compensatory strategies.  Patient transferred [facility] 08/25/12 and objective assessment warranted today to re-assess presence of silent penetration and aspiration prior to discharge on 09/02/12.   Type of Study: Modified Barium Swallowing Study Reason for Referral: Objectively evaluate swallowing function Previous Swallow Assessment: MBS on 8/23: "Mr. Wolin presents with a mild oral dyspahgia with mild oral residual due to left labial and lingual weakness. More signficant oropharyngeal dysphagia with sensory/motor deficits leading to a delay in swallow initiaton and silent aspriaiton of thin liquids before the swallow. Furthermore weakness of base of tongue and hyolaryngeal complex results in mild to moderate residual that necesitates verbal cues for a second swallow to clear. Pt is a moderate aspiration risk, reduced with nectar thick liquids and a mechanical soft diet with a second swallow and intermittent throat clear. Pt will need full supervision to follow given mild cognitive deficits. "  Diet Prior to this Study: Dysphagia 3 (soft);Nectar-thick liquids Temperature Spikes Noted: No Respiratory Status: Room air History of Recent Intubation: No Behavior/Cognition: Alert;Cooperative;Pleasant mood Oral Cavity - Dentition: Adequate natural  dentition Oral Motor / Sensory Function: Within functional limits Self-Feeding Abilities: Able to feed self;Other (Comment) (cues needed) Patient Positioning: Upright in chair Baseline Vocal Quality: Clear;Low vocal intensity Volitional Cough: Strong Volitional Swallow: Able to elicit (with increased wait time) Anatomy: Within functional limits Pharyngeal Secretions: Not observed secondary MBS  Reason for Referral:  Objectively evaluate swallowing function   Oral Phase Oral Preparation/Oral Phase Oral Phase: WFL (slightly prolonged mastication of regular textures) Pharyngeal Phase  Pharyngeal Phase Pharyngeal Phase: Impaired Pharyngeal - Nectar Pharyngeal - Nectar Cup: Premature spillage to valleculae;Delayed swallow initiation;Reduced tongue base retraction;Reduced epiglottic inversion;Penetration/Aspiration before swallow;Penetration/Aspiration after swallow;Pharyngeal residue - valleculae;Pharyngeal residue - pyriform sinuses;Lateral channel residue Penetration/Aspiration details (nectar cup): Material enters airway, remains ABOVE vocal cords then ejected out;Material enters airway, CONTACTS cords then ejected out (cleared throat with deep penetration) Pharyngeal - Thin Pharyngeal - Thin Cup: Delayed swallow initiation;Premature spillage to valleculae;Premature spillage to pyriform sinuses;Reduced epiglottic inversion;Reduced tongue base retraction;Penetration/Aspiration before swallow;Penetration/Aspiration after swallow;Pharyngeal residue - valleculae;Pharyngeal residue - pyriform sinuses;Lateral channel residue Penetration/Aspiration details (thin cup): Material enters airway, remains ABOVE vocal cords then ejected out Pharyngeal - Solids Pharyngeal - Puree: Not tested Pharyngeal - Mechanical Soft: Not tested Pharyngeal - Regular: Delayed swallow initiation;Premature spillage to valleculae;Reduced epiglottic inversion;Reduced tongue base retraction;Pharyngeal residue -  valleculae;Compensatory strategies attempted (Comment) (cleared with cued second swallow) Pharyngeal - Pill: Delayed swallow initiation;Premature spillage to valleculae;Premature spillage to pyriform sinuses;Reduced epiglottic inversion;Reduced tongue base retraction;Penetration/Aspiration before swallow;Penetration/Aspiration during swallow;Trace aspiration;Pharyngeal residue - valleculae;Pharyngeal residue - pyriform sinuses Penetration/Aspiration details (pill): Material enters airway, passes BELOW cords without attempt by patient to eject out (silent aspiration) Cervical Esophageal Phase  Cervical Esophageal Phase - Comment Cervical Esophageal Comment: no significant findings  Charlane Ferretti., CCC-SLP [phone]  Crystal Scarberry 08/31/2012, 4:50 PM

## 2012-08-31 NOTE — Progress Notes (Signed)
   Subjective:  Yesterday after coreg dose had been increased the day before  the patient had an episode of hypotension and bradycardia with rate of 40. EKG showed atrial fib with no acute ischemic changes. Now off diltiazem and off coreg. On digoxin 0.125 mg daily and digoxin level yesterday afternoon was 0.8 which is okay. Today the patient feels well.  Objective:  Vital Signs in the last 24 hours: Temp:  [97.4 F (36.3 C)-98.2 F (36.8 C)] 98.2 F (36.8 C) (09/05 0520) Pulse Rate:  [40-90] 64  (09/05 0520) Resp:  [18] 18  (09/05 0520) BP: (94-121)/(45-73) 121/73 mmHg (09/05 0520) SpO2:  [94 %-98 %] 94 % (09/05 0520) Weight:  [126 lb 8.7 oz (57.4 kg)] 126 lb 8.7 oz (57.4 kg) (09/04 1300)  Intake/Output from previous day: 09/04 0701 - 09/05 0700 In: 1562 [P.O.:840; I.V.:722] Out: 1400 [Urine:1400] Intake/Output from this shift:       . digoxin  0.125 mg Oral Daily  . rivaroxaban  15 mg Oral Daily  . senna-docusate  1 tablet Oral QHS  . DISCONTD: carvedilol  6.25 mg Oral BID WC  . DISCONTD: diltiazem  240 mg Oral Daily      . DISCONTD: sodium chloride 50 mL/hr at 08/31/12 0654    Physical Exam: The patient appears to be in no distress.  Head and neck exam reveals that the pupils are equal and reactive.  The extraocular movements are full.  There is no scleral icterus.  Mouth and pharynx are benign.  No lymphadenopathy.  No carotid bruits.  The jugular venous pressure is normal.  Thyroid is not enlarged or tender.  Chest is clear to percussion and auscultation.  No rales or rhonchi.  Expansion of the chest is symmetrical.  Heart reveals no abnormal lift or heave.  First and second heart sounds are normal.  There is no  gallop rub or click. Grade 1/6 systolic ejection murmur at base. The abdomen is soft and nontender.  Bowel sounds are normoactive.  There is no hepatosplenomegaly or mass.  There are no abdominal bruits.  Extremities reveal no phlebitis or edema.  Pedal  pulses are good.  There is no cyanosis or clubbing.  Neurologic exam alert.  Integument reveals no rash  Lab Results: No results found for this basename: WBC:2,HGB:2,PLT:2 in the last 72 hours  Basename 08/30/12 0655  NA 138  K 4.6  CL 100  CO2 30  GLUCOSE 89  BUN 27*  CREATININE 1.09   No results found for this basename: TROPONINI:2,CK,MB:2 in the last 72 hours Hepatic Function Panel No results found for this basename: PROT,ALBUMIN,AST,ALT,ALKPHOS,BILITOT,BILIDIR,IBILI in the last 72 hours No results found for this basename: CHOL in the last 72 hours No results found for this basename: PROTIME in the last 72 hours  Imaging: Imaging results have been reviewed  Cardiac Studies: EKG 08/30/12 shows atrial fib with rate 40. Assessment/Plan:   Atrial fibrillation with episode of marked bradycardia yesterday.   Assessment: Agree with stopping BB and diltiazem for now.  Will avoid BB in future and he will probably require restart of small dose of diltiazem in the future.   Plan: Will follow with you.    LOS: 6 days    Cassell Clement 08/31/2012, 10:24 AM

## 2012-09-01 ENCOUNTER — Inpatient Hospital Stay (HOSPITAL_COMMUNITY): Payer: Medicare Other | Admitting: Speech Pathology

## 2012-09-01 ENCOUNTER — Inpatient Hospital Stay (HOSPITAL_COMMUNITY): Payer: Medicare Other

## 2012-09-01 MED ORDER — RIVAROXABAN 15 MG PO TABS: 15.0000 mg | ORAL_TABLET | Freq: Every day | ORAL | Status: DC

## 2012-09-01 MED ORDER — SENNOSIDES-DOCUSATE SODIUM 8.6-50 MG PO TABS: 1.0000 | ORAL_TABLET | Freq: Every day | ORAL | Status: DC

## 2012-09-01 MED ORDER — DILTIAZEM HCL ER COATED BEADS 120 MG PO CP24
120.0000 mg | ORAL_CAPSULE | Freq: Every day | ORAL | Status: DC
  Administered 2012-09-01 – 2012-09-02 (×2): 120 mg via ORAL
  Filled 2012-09-01 (×4): qty 1

## 2012-09-01 MED ORDER — DIGOXIN 125 MCG PO TABS: 0.1250 mg | ORAL_TABLET | Freq: Every day | ORAL | Status: DC

## 2012-09-01 NOTE — Progress Notes (Signed)
Patient ID: [name], male   DOB: 07-Sep-1927, 78 y.o.   MRN: [id number] HPI: [name] is a 78 y.o. right-handed male with history of atrial fibrillation on chronic Coumadin therapy as well as history of TIA. Admitted a 2213 with slurred speech and altered mental status. MRI of the brain showed small nonhemorrhagic acute infarct extends from the posterior superior right lenticular nucleus and to the posterior right corona radiata. Remote infarcts and small vessel disease type changes left corona radiata and right thalamus infarct. MRA of the head with intracranial atherosclerotic type changes. INR on admission of 1.7. Followup cardiology services for history of atrial fibrillation with rapid ventricular response. Patient on coreg, low dose digoxin added and Cardizem titrated for rate control. Echocardiogram recently completed as outpatient with his cardiologist showed EF 45-50%.Mild MR,moderate TR. 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 Coumadin changed to Xarelto per family request Subjective/Complaints: 100% meals No further hypotensive episodes, appreciate cardiology consult, cardizem restarted  Objective: Vital Signs: Blood pressure 130/71, pulse 118, temperature 97.5 F (36.4 C), temperature source Oral, resp. rate 19, height 5' 8.9" (1.75 m), weight 57.4 kg (126 lb 8.7 oz), SpO2 95.00%. Dg Swallowing Func-no Report  08/31/2012  CLINICAL DATA: assess for silent aspiration   FLUOROSCOPY FOR SWALLOWING FUNCTION STUDY:  Fluoroscopy was provided for swallowing function study, which was  administered by a speech pathologist.  Final results and recommendations  from this study are contained within the speech pathology report.     Results for orders placed during the hospital encounter of 08/25/12 (from the past 72 hour(s))  BASIC METABOLIC PANEL     Status: Abnormal   Collection Time   08/30/12  6:55 AM      Component Value Range Comment   Sodium  138  135 - 145 mEq/L    Potassium 4.6  3.5 - 5.1 mEq/L    Chloride 100  96 - 112 mEq/L    CO2 30  19 - 32 mEq/L    Glucose, Bld 89  70 - 99 mg/dL    BUN 27 (*) 6 - 23 mg/dL    Creatinine, Ser 8.29  0.50 - 1.35 mg/dL    Calcium 9.2  8.4 - 56.2 mg/dL    GFR calc non Af Amer 61 (*) >90 mL/min    GFR calc Af Amer 71 (*) >90 mL/min   DIGOXIN LEVEL     Status: Normal   Collection Time   08/30/12  4:46 PM      Component Value Range Comment   Digoxin Level 0.8  0.8 - 2.0 ng/mL   DIGOXIN LEVEL     Status: Abnormal   Collection Time   08/31/12 10:00 AM      Component Value Range Comment   Digoxin Level 0.4 (*) 0.8 - 2.0 ng/mL      HEENT: normal Cardio: irregular Resp: CTA B/L GI: BS positive Extremity:  No Edema Skin:   Intact Neuro: Confused, Abnormal Motor 4/5 on L side and Abnormal FMC Ataxic/ dec FMC Musc/Skel:  Normal   Assessment/Plan: 1. Functional deficits secondary to R lenticular and R corona radiata infarct plan is to D/C on 9/7 if no further hypotensive episodes and HR is controlled.  If any questions can call [place] cardiologyFIM: FIM - Bathing Bathing Steps Patient Completed: Chest;Right Arm;Left Arm;Abdomen;Front perineal area;Buttocks;Right upper leg;Left upper leg;Right lower leg (including foot);Left lower leg (including foot) Bathing: 5: Supervision: Safety issues/verbal cues  FIM - Upper Body  Dressing/Undressing Upper body dressing/undressing steps patient completed: Thread/unthread right sleeve of pullover shirt/dresss;Thread/unthread left sleeve of pullover shirt/dress;Put head through opening of pull over shirt/dress;Pull shirt over trunk Upper body dressing/undressing: 5: Supervision: Safety issues/verbal cues FIM - Lower Body Dressing/Undressing Lower body dressing/undressing steps patient completed: Thread/unthread right pants leg;Thread/unthread left pants leg;Pull pants up/down;Don/Doff right sock;Don/Doff left sock;Don/Doff right shoe;Don/Doff left  shoe;Fasten/unfasten right shoe;Fasten/unfasten left shoe Lower body dressing/undressing: 5: Supervision: Safety issues/verbal cues  FIM - Toileting Toileting steps completed by patient: Adjust clothing prior to toileting;Performs perineal hygiene;Adjust clothing after toileting Toileting Assistive Devices: Grab bar or rail for support Toileting: 5: Supervision: Safety issues/verbal cues  FIM - Diplomatic Services operational officer Devices: Elevated toilet seat Toilet Transfers: 5-To toilet/BSC: Supervision (verbal cues/safety issues);5-From toilet/BSC: Supervision (verbal cues/safety issues)  FIM - Bed/Chair Transfer Bed/Chair Transfer Assistive Devices: Bed rails;Arm rests Bed/Chair Transfer: 6: Supine > Sit: No assist;5: Bed > Chair or W/C: Supervision (verbal cues/safety issues)  FIM - Locomotion: Wheelchair Locomotion: Wheelchair: 0: Activity did not occur FIM - Locomotion: Ambulation Ambulation/Gait Assistance: 5: Supervision Locomotion: Ambulation: 3: Travels 150 ft or more with moderate assistance (Pt: 50 - 74%) (LOB when fatigued)  Comprehension Comprehension Mode: Auditory Comprehension: 6-Follows complex conversation/direction: With extra time/assistive device  Expression Expression Mode: Verbal Expression: 6-Expresses complex ideas: With extra time/assistive device  Social Interaction Social Interaction: 5-Interacts appropriately 90% of the time - Needs monitoring or encouragement for participation or interaction.  Problem Solving Problem Solving: 5-Solves basic problems: With no assist  Memory Memory: 6-More than reasonable amt of time  Medical Problem List and Plan:  1. DVT Prophylaxis/Anticoagulation: Pharmaceutical: Xarelto ask Pharmacy to re eval dose now that renal fxn improving 2. Pain Management: reports Left knee pain improved. Will monitor for now.  3. Mood: Will continue to monitor for stability. LCSW to follow up for formal evaluation.  4.  Neuropsych: This patient is capable of making decisions on his/her own behalf.  5. Acute renal insufficiency: likely due to nectar thick liquids. Push po fluids. -can continue condom cath at night for now , but i want to begin timed voids in the am tomorrow..  6. A Fib: monitor with bid checks. Continue  digoxin for rate control. Blood thinner changed to xarelto ask cardiology to  f/u 7. hematuria:likely chronic, hx bladder ca no sig blood loss , recent Ucx neg 8. DJD L-Knee: improved   LOS (Days) 7 A FACE TO FACE EVALUATION WAS PERFORMED  Jamarian Jacinto E 09/01/2012, 8:12 AM

## 2012-09-01 NOTE — Plan of Care (Signed)
Problem: RH BLADDER ELIMINATION Goal: RH STG MANAGE BLADDER WITH EQUIPMENT WITH ASSISTANCE STG Manage Bladder With Equipment With Min Assistance  Outcome: Completed/Met Date Met:  09/01/12 Pt being sent home with prescription for condom cath at bedtime or if patient prefers to use bedside commode at side of bed for night time use

## 2012-09-01 NOTE — Progress Notes (Signed)
   Subjective:  No further dizzy spells. BP satisfactory. Heart rate now too fast again. Digoxin level low.  Objective:  Vital Signs in the last 24 hours: Temp:  [97.5 F (36.4 C)-98.1 F (36.7 C)] 97.5 F (36.4 C) (09/06 0447) Pulse Rate:  [95-118] 118  (09/06 0447) Resp:  [18-19] 19  (09/06 0447) BP: (106-130)/(66-75) 130/71 mmHg (09/06 0447) SpO2:  [95 %-100 %] 95 % (09/06 0447)  Intake/Output from previous day: 09/05 0701 - 09/06 0700 In: 120 [P.O.:120] Out: 1450 [Urine:1450] Intake/Output from this shift:       . digoxin  0.125 mg Oral Daily  . diltiazem  120 mg Oral Daily  . rivaroxaban  15 mg Oral Daily  . senna-docusate  1 tablet Oral QHS  . DISCONTD: carvedilol  6.25 mg Oral BID WC      . DISCONTD: sodium chloride 50 mL/hr at 08/31/12 0654    Physical Exam: The patient appears to be in no distress.  Head and neck exam reveals that the pupils are equal and reactive.  The extraocular movements are full.  There is no scleral icterus.  Mouth and pharynx are benign.  No lymphadenopathy.  No carotid bruits.  The jugular venous pressure is normal.  Thyroid is not enlarged or tender.  Chest is clear to percussion and auscultation.  No rales or rhonchi.  Expansion of the chest is symmetrical.  Heart reveals no abnormal lift or heave.  First and second heart sounds are normal.  There is no  gallop rub or click. Grade 1/6 systolic ejection murmur at base. The abdomen is soft and nontender.  Bowel sounds are normoactive.  There is no hepatosplenomegaly or mass.  There are no abdominal bruits.  Extremities reveal no phlebitis or edema.  Pedal pulses are good.  There is no cyanosis or clubbing.  Neurologic exam alert.  Integument reveals no rash  Lab Results: No results found for this basename: WBC:2,HGB:2,PLT:2 in the last 72 hours  Basename 08/30/12 0655  NA 138  K 4.6  CL 100  CO2 30  GLUCOSE 89  BUN 27*  CREATININE 1.09   No results found for this  basename: TROPONINI:2,CK,MB:2 in the last 72 hours Hepatic Function Panel No results found for this basename: PROT,ALBUMIN,AST,ALT,ALKPHOS,BILITOT,BILIDIR,IBILI in the last 72 hours No results found for this basename: CHOL in the last 72 hours No results found for this basename: PROTIME in the last 72 hours  Imaging: Imaging results have been reviewed  Cardiac Studies: EKG 08/30/12 shows atrial fib with rate 40. Assessment/Plan:   Atrial fib with rapid ventricular response. Atrial fib is chronic and established.  Plan: Avoid beta blockers but restart diltiazem CD 120 daily which he has been on at home. Continue same low dose digoxin which provides some small additional rate control. Continue xarelto. His creatinine clearance is now improved to  61. Consider checking with pharmacy--should his dose of xarelto be increased?    LOS: 7 days    [name] 09/01/2012, 7:42 AM

## 2012-09-01 NOTE — Progress Notes (Signed)
Occupational Therapy Discharge Summary  Patient Details  Name: [name] MRN: [id number] Date of Birth: 1927-08-25  Today's Date: 09/01/2012 Time: 0830-0930 Time Calculation (min): 60 min  Patient has met 9 of 9 long term goals due to improved balance, improved attention and improved coordination.  Patient to discharge at overall Supervision level.  Patient's care partner is independent to provide the necessary physical and cognitive assistance at discharge.      Recommendation:  Patient does not require additional OT services upon discharge.  Equipment: recommended bedside commode and shower with back. Also recommended grab bars in shower.  Reasons for discharge: Patient met maximum potential for rehab.  Patient/family agrees with progress made and goals achieved: Yes  OT Discharge Precautions/Restrictions  Precautions Precautions: Fall Restrictions Weight Bearing Restrictions: No Pain Pain Assessment Pain Assessment: No/denies pain ADL ADL Eating: Supervision/safety Where Assessed-Eating: Edge of bed Grooming: Supervision/safety Where Assessed-Grooming: Standing at sink Upper Body Bathing: Supervision/safety Where Assessed-Upper Body Bathing: Shower Lower Body Bathing: Supervision/safety Where Assessed-Lower Body Bathing: Shower Upper Body Dressing: Supervision/safety Where Assessed-Upper Body Dressing: Sitting at sink Lower Body Dressing: Supervision/safety Where Assessed-Lower Body Dressing: Standing at sink Toileting: Supervision/safety Where Assessed-Toileting: Teacher, adult education: Close supervision Toilet Transfer Method: Proofreader: Raised toilet seat Tub/Shower Transfer: Close supervison Web designer Method: Ship broker: Emergency planning/management officer ADL Comments: Wife pressent during ADl for family education. Vision/Perception  Vision - History Baseline Vision: No visual deficits (manifer to read  newspaper) Patient Visual Report: No change from baseline Vision - Assessment Eye Alignment: Within Functional Limits Perception Perception: Within Functional Limits  Cognition Overall Cognitive Status: Impaired at baseline Arousal/Alertness: Awake/alert Orientation Level: Oriented X4 Problem Solving: Impaired Reasoning: Impaired Sequencing: Impaired Self Correcting: Impaired Safety/Judgment: Impaired Sensation Sensation Light Touch: Appears Intact Stereognosis: Appears Intact Hot/Cold: Appears Intact Proprioception: Appears Intact Coordination Gross Motor Movements are Fluid and Coordinated: Yes Fine Motor Movements are Fluid and Coordinated: Yes Motor  Motor Motor: Abnormal postural alignment and control Motor - Discharge Observations: significant spinal asymmetries PTA  Mobility  Transfers Sit to Stand: 5: Supervision Stand to Sit: 5: Supervision  Extremity/Trunk Assessment RUE Assessment RUE Assessment: Within Functional Limits RUE AROM (degrees) Overall AROM Right Upper Extremity: Within functional limits for tasks performed RUE Strength RUE Overall Strength: Within Functional Limits for tasks performed LUE Assessment LUE Assessment: Within Functional Limits LUE AROM (degrees) Overall AROM Left Upper Extremity: Within functional limits for tasks assessed LUE Strength LUE Overall Strength: Within Functional Limits for tasks assessed  See FIM for current functional status  Skilled Therapeutic Interventions/Progress Updates:  D/C ADL and family training completed this AM with wife. Patient performed shower in tub/shower combo with tub bench. Recommend shower seat with back for home. Patient stepped into tub versus using tub bench when transferring for this reason. Provided education regarding safety during showers, bathing, dressing, and standing. Wife completed family training successfully.  Limmie Patricia, OTR/L 09/01/2012, 10:26 AM

## 2012-09-01 NOTE — Progress Notes (Signed)
Physical Therapy Discharge Summary  Patient Details  Name: Kimble Hitchens MRN: [id number] Date of Birth: 1927/09/13  Today's Date: 09/01/2012 Time: 4098-1191 and 1005-1105 Time Calculation (min): 25 and 60 min  Patient has met 8 of 8 long term goals due to improved activity tolerance, improved balance, improved postural control, increased strength, functional use of  left lower extremity and improved awareness.  Patient to discharge at an ambulatory level Supervision.   Patient's care partner is independent to provide the necessary cognitive assistance at discharge.  Reasons goals not met: n/a  Recommendation:  Patient will benefit from ongoing skilled PT services in home health setting for a couple of weeks, then transition to OPPT to continue to advance safe functional mobility, address ongoing impairments in strength, balance, muscle timing and sequencing, posture, and minimize fall risk.  Pt has a hx of frequent urination at night, sometimes walking to his BR 6x/night , PTA since his bladder cancer.  Therapists strongly recommended to pt and his wife that he use a hand-held urinal, or bedside commode to avoid this high-risk habit.  Equipment: No equipment provided for mobility. Reasons for discharge: treatment goals met and discharge from hospital  Patient/family agrees with progress made and goals achieved: Yes  PT Discharge Precautions/Restrictions Precautions Precautions: Fall Restrictions Weight Bearing Restrictions: No   Pain Pain Assessment Pain Assessment: No/denies pain Vision/Perception  Vision - History Baseline Vision: No visual deficits (manifer to read newspaper) Patient Visual Report: No change from baseline Vision - Assessment Eye Alignment: Within Functional Limits Perception Perception: Within Functional Limits  Cognition Overall Cognitive Status: Impaired at baseline Arousal/Alertness: Awake/alert Orientation Level: Oriented X4 Problem Solving:  Impaired Reasoning: Impaired Sequencing: Impaired Self Correcting: Impaired Safety/Judgment: Impaired Sensation Sensation Light Touch: Appears Intact Stereognosis: Appears Intact Hot/Cold: Appears Intact Proprioception: Appears Intact Coordination Gross Motor Movements are Fluid and Coordinated: Yes Fine Motor Movements are Fluid and Coordinated: Yes Motor  Motor Motor: Abnormal postural alignment and control  Mobility Bed Mobility Bed Mobility: Rolling Right;Right Sidelying to Sit Rolling Right: 6: Modified independent (Device/Increase time) Right Sidelying to Sit: 6: Modified independent (Device/Increase time) Supine to Sit: 6: Modified independent (Device/Increase time) Sit to Supine: 6: Modified independent (Device/Increase time) Transfers Sit to Stand: 5: Supervision Stand to Sit: 5: Supervision Locomotion  Gait Gait velocity: decreased gait speed; L toe catches when shifting to faster speed Stairs / Careers information officer Management Technique: One rail Left Number of Stairs: 5  Height of Stairs: 8  Wheelchair Mobility Wheelchair Mobility: No  Trunk/Postural Assessment  Cervical Assessment Cervical Assessment: Within Functional Limits Thoracic Assessment Thoracic Assessment: Within Functional Limits (kyphosis) Lumbar Assessment Lumbar Assessment: Within Functional Limits (L hip higher, PTA) Postural Control Postural Control: Deficits on evaluation (delayed protective and righting reactions)  Balance Balance Balance Assessed: Yes Berg Balance Test Sit to Stand: Able to stand  independently using hands Standing Unsupported: Able to stand 2 minutes with supervision Sitting with Back Unsupported but Feet Supported on Floor or Stool: Able to sit safely and securely 2 minutes Stand to Sit: Sits safely with minimal use of hands Transfers: Able to transfer safely, minor use of hands Standing Unsupported with Eyes Closed: Able to stand 10 seconds  safely Standing Ubsupported with Feet Together: Able to place feet together independently and stand 1 minute safely From Standing, Reach Forward with Outstretched Arm: Can reach forward >12 cm safely (5") From Standing Position, Pick up Object from Floor: Able to pick up shoe, needs supervision From Standing Position, Turn to  Look Behind Over each Shoulder: Looks behind one side only/other side shows less weight shift Turn 360 Degrees: Able to turn 360 degrees safely but slowly Standing Unsupported, Alternately Place Feet on Step/Stool: Able to complete >2 steps/needs minimal assist Standing Unsupported, One Foot in Front: Loses balance while stepping or standing Standing on One Leg: Tries to lift leg/unable to hold 3 seconds but remains standing independently Total Score: 39; significant risk for falls  Dynamic Standing Balance Dynamic Standing - Level of Assistance: 5: Stand by assistance Extremity Assessment  RUE Assessment RUE Assessment: Within Functional Limits RUE AROM (degrees) Overall AROM Right Upper Extremity: Within functional limits for tasks performed RUE Strength RUE Overall Strength: Within Functional Limits for tasks performed LUE Assessment LUE Assessment: Within Functional Limits LUE AROM (degrees) Overall AROM Left Upper Extremity: Within functional limits for tasks assessed LUE Strength LUE Overall Strength: Within Functional Limits for tasks assessed   LLE Assessment LLE Assessment: Exceptions to [facility] (grossly 4+/5 except DF 4-/5)  Tx today:   1st tx-  bed mobility to sit on EOB to eat breakfast, work on trunk extension in sitting during functional task.    Sitting balance to don shoes with supervision.  Gait training without AD x 80' including turns, without LOB, VCs for upright trunk and forward gaze.  2nd tx- Wife present for family ed, supervising gait x 200' in controlled environment and on carpet in home environment in sock feet, up/down 5 steps with  1 rail x 2, dry run toilet transfer, simulated car transfer to sedan height seat.  Wife also observed pt performing floor transfer with therapist, VCs for technique, up to armchair.  Pt demonstrated correct approach to toilet on L, without attempting to turn all the way around, without VCs.  Wife trained in assisting pt with her hand on pt's hips if he has LOB during gait.  Berg = 39/56. Pt with LOB to L on more difficult tasks of Berg.    See FIM for current functional status  Kalila Adkison 09/01/2012, 12:18 PM

## 2012-09-01 NOTE — Progress Notes (Signed)
MEDICATION RELATED CONSULT NOTE - INITIAL   Pharmacy Consult for Xarelto- review dosing for renal function Indication: AFib  Allergies  Allergen Reactions  . Diazepam     REACTION: agitation  . Ezetimibe-Simvastatin   . Morphine Other (See Comments)    Hallucinations.  . Sulfonamide Derivatives Hives    Patient Measurements: Height: 5' 8.9" (175 cm) Weight: 126 lb 8.7 oz (57.4 kg) IBW/kg (Calculated) : 70.47  Adjusted Body Weight: 57kg  Vital Signs: Temp: 97.5 F (36.4 C) (09/06 0447) Temp src: Oral (09/06 0447) BP: 130/71 mmHg (09/06 0447) Pulse Rate: 118  (09/06 0447) Intake/Output from previous day: 09/05 0701 - 09/06 0700 In: 120 [P.O.:120] Out: 1450 [Urine:1450] Intake/Output from this shift:    Labs:  [facility] 08/30/12 0655  WBC --  HGB --  HCT --  PLT --  APTT --  CREATININE 1.09  LABCREA --  CREATININE 1.09  [id number] --  MG --  PHOS --  ALBUMIN --  PROT --  ALBUMIN --  AST --  ALT --  ALKPHOS --  BILITOT --  BILIDIR --  IBILI --   Estimated Creatinine Clearance: 42.4 ml/min (by C-G formula based on Cr of 1.09).   Microbiology: Recent Results (from the past 720 hour(s))  MRSA PCR SCREENING     Status: Normal   Collection Time   08/17/12  5:30 PM      Component Value Range Status Comment   MRSA by PCR NEGATIVE  NEGATIVE Final   URINE CULTURE     Status: Normal   Collection Time   08/21/12 10:02 AM      Component Value Range Status Comment   Specimen Description URINE, CLEAN CATCH   Final    Special Requests [facility] ADD [id number]   Final    Culture  Setup Time 08/21/2012 12:42   Final    Colony Count NO GROWTH   Final    Culture NO GROWTH   Final    Report Status 08/22/2012 FINAL   Final   URINE CULTURE     Status: Normal   Collection Time   08/26/12  5:50 AM      Component Value Range Status Comment   Specimen Description URINE, CLEAN CATCH   Final    Special Requests NONE   Final    Culture  Setup Time 08/26/2012 11:22   Final     Colony Count NO GROWTH   Final    Culture NO GROWTH   Final    Report Status 08/27/2012 FINAL   Final     Medical History: Past Medical History  Diagnosis Date  . NEOPLASM, MALIGNANT, BLADDER 04/10/2009  . HYPERLIPIDEMIA 04/10/2009  . ANEMIA-NOS 04/10/2009  . 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 ANXIETY 04/10/2009  . HYPERTENSION 04/10/2009  . Atrial fibrillation 04/10/2009  . COPD 04/10/2009  . TRANSIENT ISCHEMIC ATTACK, HX OF 04/10/2009  . Bladder cancer     Dr. Darvin Neighbours  . Lacunar stroke 04/07/2011  . Pleural effusion 04/07/2011  . 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 digoxin  0.125 mg Oral Daily  . diltiazem  120 mg Oral Daily  . rivaroxaban  15 mg Oral Daily  . senna-docusate  1 tablet Oral QHS  . DISCONTD: carvedilol  6.25 mg Oral BID WC    Assessment: 78yo male with AFib and new ischemic strokes.  He had been on Coumadin PTA, but now on Xarelto per family request.  CrCl is ~7ml/min  based on Cr 1.09 (9/4) and wt 57kg.  Based on recommendations for renally adjusting Xarelto in pt's with Nonvalvular AFib for CrCl 15-50 ml/min, he is receiving an appropriate dose.  Goal of Therapy:  Prevention of stroke and systemic embolism  Plan:  1.  Continue Xarelto 15mg  daily 2.  Will watch renal function and adjust dosing as needed.  Marisue Humble, PharmD Clinical Pharmacist [facility]

## 2012-09-01 NOTE — Progress Notes (Signed)
Speech Language Pathology Daily Session Note & Discharge Summary  Patient Details  Name: [name] MRN: [id number] Date of Birth: 1927/01/04  Today's Date: 09/01/2012 Time: 4098-1191 Time Calculation (min): 45 min  Short Term Goals: Week 1: SLP Short Term Goal 1 (Week 1): Pt will tolerate trials of regular consistency diet with nectar-thick liquids with min assist to follow precautions and maximize safety. SLP Short Term Goal 1 - Progress (Week 1): Met SLP Short Term Goal 2 (Week 1): Pt will participate in lingual-pharyngeal strengthening exercises with min assist for execution. SLP Short Term Goal 3 (Week 1): Pt will use environmental aids to facilitate recall of new, pertinent information in order to participate in care with min assist. SLP Short Term Goal 4 (Week 1): Pt will tolerate a regular consistency diet with thin liquids with supervision level cues to follow precautions and maximize safety.  Skilled Therapeutic Interventions: Treatment session focused on addressing dysphagia goals and completing family education.  Patient consumed thin liquids via cup with increased wait time to recall and utilize safe swallow compensatory strategies.  SLP provided intermittent cues to perform hard cough every other or third sip instead of a throat clear.  SLP also provided patient and wife with hands outs to assist with recall of safe swallow compensatory strategies and pharyngeal strengthening exercises; educated on s/s of aspiration PNA and if observed to notify MD. Wife verbalized understanding of information provided.    FIM:  Comprehension Comprehension Mode: Auditory Comprehension: 6-Follows complex conversation/direction: With extra time/assistive device Expression Expression Mode: Verbal Expression: 6-Expresses complex ideas: With extra time/assistive device Social Interaction Social Interaction: 5-Interacts appropriately 90% of the time - Needs monitoring or encouragement for  participation or interaction. Problem Solving Problem Solving: 5-Solves complex 90% of the time/cues < 10% of the time Memory Memory: 6-More than reasonable amt of time FIM - Eating Eating Activity: 6: More than reasonable amount of time  Pain Pain Assessment Pain Assessment: No/denies pain  Therapy/Group: Individual Therapy   Speech Language Pathology Discharge Summary  Patient Details  Name: [name] MRN: [id number] Date of Birth: 03-21-1927  Today's Date: 09/01/2012  Patient has met 4 of 4 long term goals.  Patient to discharge at overall Modified Independent;Supervision level.  Reasons goals not met: n/a   Clinical Impression/Discharge Summary: Patient demonstrated functional gains in swallow function and recall if new information during CIR stay; patient met 4 out of 4 goals.  Patient advanced from a Dys.3 texture and Nectar-thick liquid diet to regular textures and thin liquids with increased wait time and periodic cues to perform hard effort coughs instead of throat clears.  Education has been completed ; however SLP recommends continued SLP at [facility] to facilitate management of mixed consistencies.   Care Partner:  Caregiver Able to Provide Assistance: Yes  Type of Caregiver Assistance: Cognitive  Recommendation:  Home Health SLP;24 hour supervision/assistance  Rationale for SLP Follow Up: Maximize swallowing safety;Reduce caregiver burden;Maximize cognitive function and independence   Equipment: none   Reasons for discharge: Treatment goals met;Discharged from hospital   Patient/Family Agrees with Progress Made and Goals Achieved: Yes   See FIM for current functional status  Charlane Ferretti., CCC-SLP [phone]  Reet Scharrer 09/01/2012, 5:08 PM

## 2012-09-01 NOTE — Progress Notes (Signed)
Social Work Patient ID: [name], male   DOB: 1927/02/20, 78 y.o.   MRN: [id number] Met with pt and wife to confirm discharge needs-want a tub seat, borrowing a bsc and no assitive device recommended. Pt to have home health therapies  then transition to OP.  Still adjusting his meds for home.  Wife has completed family education And set for discharge tomorrow, if medically ready up to MD.

## 2012-09-01 NOTE — Progress Notes (Signed)
Social Work Discharge Note Discharge Note  The overall goal for the admission was met for:   Discharge location: Yes-HOME WITH WIFE PROVIDING 24 HOUR SUPERVISION  Length of Stay: Yes-8 DAYS  Discharge activity level: Yes-SUPERVISION LEVEL  Home/community participation: Yes  Services provided included: MD, RD, PT, OT, SLP, RN, TR, Pharmacy and SW  Financial Services: Private Insurance: [facility]  Follow-up services arranged: Home Health: [facility]-PT,OT,RN,SPT, DME: [facility]-TUB SEAT and Patient/Family has no preference for HH/DME agencies  Comments (or additional information):PT TO START OFF WITH HOME HEALTH THEN TRANSITION TO OP.  FAMILY EDUCAITON COMPLETED WIFE COMFORTABLE WITH PT'S CARE AT DISCHARGE.  Patient/Family verbalized understanding of follow-up arrangements: Yes  Individual responsible for coordination of the follow-up plan: BETTIE-WIFE  Confirmed correct DME delivered: Lucy Chris 09/01/2012    Lucy Chris

## 2012-09-02 DIAGNOSIS — I6340 Cerebral infarction due to embolism of unspecified cerebral artery: Secondary | ICD-10-CM

## 2012-09-02 DIAGNOSIS — Z5189 Encounter for other specified aftercare: Secondary | ICD-10-CM

## 2012-09-02 DIAGNOSIS — I69993 Ataxia following unspecified cerebrovascular disease: Secondary | ICD-10-CM

## 2012-09-02 NOTE — Progress Notes (Signed)
Patient ID: [name], male   DOB: [birth year], 78 y.o.   MRN: [id number] HPI: [name] is a 78 y.o. right-handed male with history of atrial fibrillation on chronic Coumadin therapy as well as history of TIA. Admitted a 2276 with slurred speech and altered mental status. MRI of the brain showed small nonhemorrhagic acute infarct extends from the posterior superior right lenticular nucleus and to the posterior right corona radiata. Remote infarcts and small vessel disease type changes left corona radiata and right thalamus infarct. MRA of the head with intracranial atherosclerotic type changes. INR on admission of 1.7. Followup cardiology services for history of atrial fibrillation with rapid ventricular response. Patient on coreg, low dose digoxin added and Cardizem titrated for rate control. Echocardiogram recently completed as outpatient with his cardiologist showed EF 45-50%.Mild MR,moderate TR. 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 Coumadin changed to Xarelto per family request Subjective/Complaints: 100% meals. Denies sob, palpitations, emptying bladder now  Objective: Vital Signs: Blood pressure 107/59, pulse 101, temperature 97.9 F (36.6 C), temperature source Oral, resp. rate 17, height 5' 8.9" (1.75 m), weight 57.4 kg (126 lb 8.7 oz), SpO2 99.00%. Dg Swallowing Func-no Report  08/31/2012  CLINICAL DATA: assess for silent aspiration   FLUOROSCOPY FOR SWALLOWING FUNCTION STUDY:  Fluoroscopy was provided for swallowing function study, which was  administered by a speech pathologist.  Final results and recommendations  from this study are contained within the speech pathology report.     Results for orders placed during the hospital encounter of 08/25/12 (from the past 72 hour(s))  DIGOXIN LEVEL     Status: Normal   Collection Time   08/30/12  4:46 PM      Component Value Range Comment   Digoxin Level 0.8  0.8 - 2.0 ng/mL   DIGOXIN LEVEL      Status: Abnormal   Collection Time   08/31/12 10:00 AM      Component Value Range Comment   Digoxin Level 0.4 (*) 0.8 - 2.0 ng/mL      HEENT: normal Cardio: irregular Resp: CTA B/L GI: BS positive Extremity:  No Edema Skin:   Intact Neuro: Confused, Abnormal Motor 4/5 on L side and Abnormal FMC Ataxic/ dec FMC Musc/Skel:  Normal   Assessment/Plan: 1. Functional deficits secondary to R lenticular and R corona radiata infarct plan is to D/C on 9/7 if no further hypotensive episodes and HR is controlled.  If any questions can call [place] cardiologyFIM: FIM - Bathing Bathing Steps Patient Completed: Chest;Right Arm;Left Arm;Abdomen;Front perineal area;Buttocks;Right upper leg;Left upper leg;Right lower leg (including foot);Left lower leg (including foot) Bathing: 5: Supervision: Safety issues/verbal cues  FIM - Upper Body Dressing/Undressing Upper body dressing/undressing steps patient completed: Thread/unthread right sleeve of pullover shirt/dresss;Thread/unthread left sleeve of pullover shirt/dress;Put head through opening of pull over shirt/dress;Pull shirt over trunk Upper body dressing/undressing: 5: Supervision: Safety issues/verbal cues FIM - Lower Body Dressing/Undressing Lower body dressing/undressing steps patient completed: Thread/unthread right pants leg;Thread/unthread left pants leg;Pull pants up/down;Don/Doff right sock;Don/Doff left sock;Don/Doff right shoe;Don/Doff left shoe;Fasten/unfasten right shoe;Fasten/unfasten left shoe Lower body dressing/undressing: 5: Supervision: Safety issues/verbal cues  FIM - Toileting Toileting steps completed by patient: Adjust clothing prior to toileting;Performs perineal hygiene;Adjust clothing after toileting Toileting Assistive Devices: Grab bar or rail for support Toileting: 5: Supervision: Safety issues/verbal cues  FIM - Diplomatic Services operational officer Devices: Elevated toilet seat;Grab bars Toilet Transfers: 5-To  toilet/BSC: Supervision (verbal cues/safety issues);5-From toilet/BSC: Supervision (verbal cues/safety issues)  FIM - Bed/Chair Transfer Bed/Chair Transfer Assistive Devices: Bed rails;Arm rests Bed/Chair Transfer: 5: Bed > Chair or W/C: Supervision (verbal cues/safety issues);5: Chair or W/C > Bed: Supervision (verbal cues/safety issues)  FIM - Locomotion: Wheelchair Locomotion: Wheelchair: 0: Activity did not occur FIM - Locomotion: Ambulation Ambulation/Gait Assistance: 5: Supervision Locomotion: Ambulation: 5: Travels 150 ft or more with supervision/safety issues  Comprehension Comprehension Mode: Auditory Comprehension: 6-Follows complex conversation/direction: With extra time/assistive device  Expression Expression Mode: Verbal Expression: 6-Expresses complex ideas: With extra time/assistive device  Social Interaction Social Interaction: 5-Interacts appropriately 90% of the time - Needs monitoring or encouragement for participation or interaction.  Problem Solving Problem Solving: 5-Solves complex 90% of the time/cues < 10% of the time  Memory Memory: 6-More than reasonable amt of time  Medical Problem List and Plan:  1. DVT Prophylaxis/Anticoagulation: Pharmaceutical: Xarelto ask Pharmacy to re eval dose now that renal fxn improving 2. Pain Management: reports Left knee pain improved. Will monitor for now.  3. Mood: Will continue to monitor for stability. LCSW to follow up for formal evaluation.  4. Neuropsych: This patient is capable of making decisions on his/her own behalf.  5. Acute renal insufficiency: likely due to nectar thick liquids. Push po fluids. -can continue condom cath at night for now , but i want to begin timed voids in the am tomorrow..  6. A Fib: monitor with bid checks. Continue  digoxin for rate control. Blood thinner changed to xarelto. For the most part HR has been around 100-110. May need further adjustment for rate control, by hypotension has also  been a problem 7. hematuria:likely chronic, hx bladder ca no sig blood loss , recent Ucx neg  -seems to be emptying bladder fairly well.  8. DJD L-Knee: improved   LOS (Days) 8 A FACE TO FACE EVALUATION WAS PERFORMED  SWARTZ,ZACHARY T 09/02/2012, 7:13 AM

## 2012-09-02 NOTE — Progress Notes (Signed)
Patient discharged . Instructions given on Friday. Patient under stood instructions. Left in wheelchair with wife pushing. Staff member with them.

## 2012-09-11 ENCOUNTER — Ambulatory Visit (INDEPENDENT_AMBULATORY_CARE_PROVIDER_SITE_OTHER): Payer: Medicare Other | Admitting: Internal Medicine

## 2012-09-11 ENCOUNTER — Encounter: Payer: Self-pay | Admitting: Internal Medicine

## 2012-09-11 VITALS — BP 120/62 | HR 95 | Temp 97.0°F | Ht 69.0 in | Wt 127.1 lb

## 2012-09-11 DIAGNOSIS — Z0000 Encounter for general adult medical examination without abnormal findings: Secondary | ICD-10-CM

## 2012-09-11 DIAGNOSIS — R351 Nocturia: Secondary | ICD-10-CM

## 2012-09-11 NOTE — Progress Notes (Signed)
Discharge summary [phone]

## 2012-09-11 NOTE — Patient Instructions (Addendum)
Please call if you change your mind about the flomax for the prostate Please keep your appointments with your specialists as you have planned - Dr Davis/urology, and Dr Anne Shutter, and Dr Doroteo Bradford Continue all other medications as before Please have the pharmacy call with any refills you may need Your right ear was irrigated of wax today No need for further blood work today, and [name] will most likely order the PSA  Please return in 6 months, or sooner if needed

## 2012-09-12 NOTE — Discharge Summary (Signed)
NAMEOSRIC, [name] NO.:  [id number]  MEDICAL RECORD NO.:  [id number]  LOCATION:  4144                         FACILITY:  [facility]  PHYSICIAN:  Ranelle Oyster, M.D.DATE OF BIRTH:  1927-05-30  DATE OF ADMISSION:  08/25/2012 DATE OF DISCHARGE:  09/02/2012                              DISCHARGE SUMMARY   DISCHARGE DIAGNOSES: 1. 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 2. Chronic intermittent hematuria. 3. Degenerative joint disease, left knee. 4. Acute renal insufficiency, improved. 5. Atrial fibrillation.  HISTORY OF PRESENT ILLNESS:  [name] is an 78 year old male with history of AFib, on chronic Coumadin as well as history of TIAs in the past.  He was admitted on August 17, 2012, with slurred speech and altered mental status.  An MRI of brain done, revealed small nonhemorrhagic acute infarcts in superior rightlenticular nucleus and posterior right corona radiata.  Cardiology was consulted for input on AFib with RVR.  He was started on low-dose digoxin as well as Cardizem was titrated for rate control.  Neurology recommended continuing chronic Coumadin for suspect embolic infarct.  Family had concerns about the patient being on Coumadin as his INR levels have been difficult to manage.  This was discussed with Cardiology who recommended changing the patient to Xarelto.  The patient was also noted to have an episode of hematuria and was treated with short course of Rocephin with recommendations to follow up with GU past-discharge.  He developed complaints of left knee pain.  X-rays done showed some degenerative changes and the patient was started on colchicine for suspicion of gout flare.  Therapies were initiated and CIR was recommended for progression.  PAST MEDICAL HISTORY: 1. Anxiety. 2. Hypertension. 3. AFib. 4. COPD. 5. TIA. 6. Bladder cancer. 7. Lacunar infarct. 8. Pleural effusion. 9.  ITP. 10.Anemia.  FUNCTIONAL HISTORY:  The patient was independent for mobility prior to admission.  He was still driving short distances.  FUNCTIONAL STATUS:  The patient was at supervision level for sit-to- stand transfers, min-to-guard-assist for ambulating 200 feet with rolling walker with decreased stride and flexed posture.  He required min-assist for grooming, min-assist for toileting.  He was noted to have decrease short-term memory with impaired awareness of deficits, problems with sequencing, self correcting as well as dysphagia requiring nectar thick liquids.  HOSPITAL COURSE:  [name] was admitted to Rehab on August 25, 2012, for inpatient therapies to consist of PT, OT at least 3 hours 5 days a week.  Past-admission, physiatrist, rehab RN, and therapy team have worked together to provide customized collaborative interdisciplinary care.  Rehab RN has worked with the patient on bowel and bladder program as well as med administration.  The patient was noted to be slightly dehydrated admission; therefore, he was started on IV fluids at bedtime to prevent worsening of his symptoms.  Initially, Cardizem and digoxin were continued for rate control.  His blood pressures were monitored on b.i.d. basis and these were noted to be well controlled.  He did develop hypotension with bradycardia, requiring discontinue of digoxin as well as adjustment of his Cardizem dose.  Blood pressure at time of discharge was ranging from 102-130  systolics, 50s-70s diastolic.  Heart rate was at 100-110 range.  He will have further titration of his medications by Cardiology past-discharge.  The patient's diet was advanced to regular thin liquids.  His hydration status was noted to be improve.  Last check of labs from August 30, 2012, revealed sodium 138, potassium 4.6, chloride 100, CO2 30, BUN 27, creatinine 1.09, glucose 89.  His H and H was monitored and CBC from August 28, 2012,  reveals hemoglobin at 12.1, hematocrit 35.6.  Repeat urine culture was done on August 21, 2012, showing wbc's too numerous to count and rbc's too numerous to count.  Urine culture, however, showed no growth.  Repeat UA of August 26, 2012, showed rbc's at 21-50.  Urine culture showed no growth.  The patient is advised to follow up with Urology due to his continued microhematuria and history of bladder cancer.  Wife reports they have followup appointment with Dr. Earlene Plater for the near future.  No gross hematuria noted during this stay.  During the patient's stay in Rehab, weekly team conferences were held to monitor the patient's progress, set goals, as well as discuss barriers to discharge.  Speech Therapy has been working with the patient for dysphagia treatment.  The patient was able to tolerate regular diet with thin liquids via cup with increased wait time needed to recall in utilize safe swallow compensatory strategies.  He required intermittent cues to perform strategies.  The patient and wife were educated on compensatory swallow strategies as well as pharyngeal strengthening exercises.  The patient was also educated on utilization of environmental aids to facilitate recall of new input and information. OT has worked with the patient on self-care tasks as well as attention, coordination, and balance.  The patient progressed to being at supervision level for bathing and dressing.  He requires close supervision for toilet and tub shower transfers.    Wife was educated regarding supervision needed for ADLs and she will continue to provide this past-discharge.  No further OT needs.  Physical Therapy has worked with the patient on balance, postural control,  Strengthening as well as overall mobility.  The patient was modified independent for bed mobility, requires supervision for sit-to-stand  transfers.  He was able to ambulate up to 200 feet in controlled  environment with  supervision.He was able to navigate 5 stairs with 1 rail as well as stimulated car transfers at supervision level.  His Berg balance score is at 39/56.  He is noted to have loss of balance  to left with mod difficulty tasks. Wife was trained in assisting the patient with hands on his hip if he had loss of balance during gait. Further followup Home Health therapies to continue past-discharge.  On September 02, 2012, the patient is discharged to home in improved condition.  DISCHARGE MEDICATIONS: 1. Digoxin 0.125 mg p.o. per day. 2. Xarelto 15 mg p.o. per day. 3. Senokot-S 1 p.o. at bedtime. 4. Cardizem 120 mg p.o. per day. 5. Vitamin D 2000 units daily except 3000 units on winter days.  DIET:  Cardiac diet.  ACTIVITY: As tolerated with 24-hour supervision.  SPECIAL INSTRUCTIONS:  No driving.  Use condom cath at nights to help with sleep hygiene and fall prevention.  [facility] to provide PT, OT, and RN.  FOLLOWUP:  The patient to follow up with Dr. Claudette Laws on September 18, 2012, at 10:30 a.m.; follow up with Dr. Patty Sermons for routine appointment and further adjustment in his meds; follow up  with Dr. Oliver Barre on September 11, 2012, at 1:30 p.m.; follow up with Dr. Darvin Neighbours for further workup of hematuria.     Delle Reining, P.A.   ______________________________ Ranelle Oyster, M.D.    PL/MEDQ  D:  09/11/2012  T:  09/12/2012  Job:  [id number]  cc:   Cassell Clement, M.D. Ronald L. Earlene Plater, M.D. Corwin Levins, MD

## 2012-09-13 ENCOUNTER — Encounter: Payer: Self-pay | Admitting: Nurse Practitioner

## 2012-09-13 ENCOUNTER — Ambulatory Visit (INDEPENDENT_AMBULATORY_CARE_PROVIDER_SITE_OTHER): Payer: Medicare Other | Admitting: Nurse Practitioner

## 2012-09-13 VITALS — BP 130/70 | HR 66 | Ht 69.0 in | Wt 126.8 lb

## 2012-09-13 DIAGNOSIS — I6350 Cerebral infarction due to unspecified occlusion or stenosis of unspecified cerebral artery: Secondary | ICD-10-CM

## 2012-09-13 DIAGNOSIS — N189 Chronic kidney disease, unspecified: Secondary | ICD-10-CM

## 2012-09-13 DIAGNOSIS — Z7901 Long term (current) use of anticoagulants: Secondary | ICD-10-CM

## 2012-09-13 DIAGNOSIS — I639 Cerebral infarction, unspecified: Secondary | ICD-10-CM

## 2012-09-13 DIAGNOSIS — I4891 Unspecified atrial fibrillation: Secondary | ICD-10-CM

## 2012-09-13 LAB — CBC WITH DIFFERENTIAL/PLATELET
Basophils Absolute: 0 10*3/uL (ref 0.0–0.1)
Basophils Relative: 0.2 % (ref 0.0–3.0)
Eosinophils Absolute: 0.1 10*3/uL (ref 0.0–0.7)
Eosinophils Relative: 1.4 % (ref 0.0–5.0)
HCT: 39.3 % (ref 39.0–52.0)
Hemoglobin: 12.8 g/dL — ABNORMAL LOW (ref 13.0–17.0)
Lymphocytes Relative: 18.1 % (ref 12.0–46.0)
Lymphs Abs: 1 10*3/uL (ref 0.7–4.0)
MCHC: 32.5 g/dL (ref 30.0–36.0)
MCV: 104.8 fl — ABNORMAL HIGH (ref 78.0–100.0)
Monocytes Absolute: 0.8 10*3/uL (ref 0.1–1.0)
Monocytes Relative: 13.3 % — ABNORMAL HIGH (ref 3.0–12.0)
Neutro Abs: 3.8 10*3/uL (ref 1.4–7.7)
Neutrophils Relative %: 67 % (ref 43.0–77.0)
Platelets: 152 10*3/uL (ref 150.0–400.0)
RBC: 3.75 Mil/uL — ABNORMAL LOW (ref 4.22–5.81)
RDW: 14.8 % — ABNORMAL HIGH (ref 11.5–14.6)
WBC: 5.7 10*3/uL (ref 4.5–10.5)

## 2012-09-13 LAB — BASIC METABOLIC PANEL
BUN: 29 mg/dL — ABNORMAL HIGH (ref 6–23)
CO2: 34 mEq/L — ABNORMAL HIGH (ref 19–32)
Calcium: 9.3 mg/dL (ref 8.4–10.5)
Chloride: 99 mEq/L (ref 96–112)
Creatinine, Ser: 1.1 mg/dL (ref 0.4–1.5)
GFR: 70.92 mL/min (ref >60.00)
Glucose, Bld: 79 mg/dL (ref 70–99)
Potassium: 4.7 mEq/L (ref 3.5–5.1)
Sodium: 137 mEq/L (ref 135–145)

## 2012-09-13 NOTE — Progress Notes (Signed)
[name] Date of Birth: 1927/01/30 Medical Record #[id number]  History of Present Illness: [name] is seen back today. He is seen for Dr. Patty Sermons. He will be establishing with Dr. Patty Sermons. Previously followed by Dr. Tonie Griffith and Arnette Felts in [facility]. Has had chronic atrial fib, difficult to control his rate. Previously on coumadin. Now with recent CVA. He had a right small nonhemorrhagic infarct on MRI. He had been on coumadin but INR was 1.6. He is now on Xarelto. His other problems include CRI, HLD, anemia, HTN, COPD, remote ITP with prior spleenectomy and pseudogout. He has had a remote echo back in 2010 showing an EF of 45 to 50%. He also has bladder cancer and is followed by Dr. Earlene Plater in [place].   He comes in today. He is here with his wife. She provides a lot of history. She feels like he is doing well. No chest pain. Not short of breath. Tolerating his rehab. She feels like he is getting stronger. He had had some right sided weakness and dysphagia. Had lots of difficulty with controlling his rate while in rehab at [facility]. Now on low dose CCB and digoxin. He is now in chronic atrial fib. Tolerating Xarelto without issue. He is only on 15 mg due to his kidneys.  Current Outpatient Prescriptions on File Prior to Visit  Medication Sig Dispense Refill  . Cholecalciferol (VITAMIN D) 2000 UNITS tablet Take 2,000-3,000 Units by mouth daily as needed. Patient takes 2000 units daily, except for taking 3000 units (1.5 tablets) on "winter days" and days when "not feeling well."      . digoxin (LANOXIN) 0.125 MG tablet Take 1 tablet (0.125 mg total) by mouth daily.  30 tablet  1  . diltiazem (TIAZAC) 120 MG 24 hr capsule Take 120 mg by mouth daily.      . Rivaroxaban (XARELTO) 15 MG TABS tablet Take 1 tablet (15 mg total) by mouth daily.  30 tablet  1  . senna-docusate (SENOKOT-S) 8.6-50 MG per tablet Take 1 tablet by mouth at bedtime.        Allergies  Allergen Reactions  . Diazepam       REACTION: agitation  . Ezetimibe-Simvastatin   . Morphine Other (See Comments)    Hallucinations.  . Sulfonamide Derivatives Hives    Past Medical History  Diagnosis Date  . NEOPLASM, MALIGNANT, BLADDER 04/10/2009  . HYPERLIPIDEMIA 04/10/2009  . ANEMIA-NOS 04/10/2009  . 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 ANXIETY 04/10/2009  . HYPERTENSION 04/10/2009  . Atrial fibrillation 04/10/2009  . COPD 04/10/2009  . TRANSIENT ISCHEMIC ATTACK, HX OF 04/10/2009  . Bladder cancer     Dr. Darvin Neighbours  . Lacunar stroke 04/07/2011  . Pleural effusion 04/07/2011  . 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 Splenectomy   . Bilateral vats ablation   . Facial cancer     facial skin cancer    History  Smoking status  . Never Smoker   Smokeless tobacco  . Not on file    History  Alcohol Use No    Family History  Problem Relation Age of Onset  . Heart disease Mother   . Arthritis Father     Review of Systems: The review of systems is per the HPI.  All other systems were reviewed and are negative.  Physical Exam: BP 130/70  Pulse 66  Ht 5\' 9"  (1.753 m)  Wt 126 lb 12.8 oz (57.516 kg)  BMI 18.73 kg/m2 Patient is very pleasant and in no acute distress. He is quite thin. Skin is warm and dry. Color is normal.  HEENT is unremarkable. Normocephalic/atraumatic. PERRL. Sclera are nonicteric. Neck is supple. No masses. No JVD. Lungs are clear. Cardiac exam shows an irregular rhythm. His rate is controlled. HR was 88 by me. Abdomen is soft. Extremities are without edema. Gait and ROM are intact. No gross neurologic deficits noted.  LABORATORY DATA: PENDING FOR TODAY  Lab Results  Component Value Date   WBC 6.6 08/28/2012   HGB 12.1* 08/28/2012   HCT 35.6* 08/28/2012   PLT 240 08/28/2012   GLUCOSE 89 08/30/2012   CHOL 165 08/18/2012   TRIG 57 08/18/2012   HDL 58 08/18/2012   LDLCALC 96 08/18/2012   ALT 24 08/28/2012   AST 27 08/28/2012   NA 138 08/30/2012   K 4.6  08/30/2012   CL 100 08/30/2012   CREATININE 1.09 08/30/2012   BUN 27* 08/30/2012   CO2 30 08/30/2012   TSH 3.740 03/31/2011   INR 1.63* 08/25/2012   HGBA1C 6.0* 08/18/2012   Dig level was 0.4 one week ago.  Assessment / Plan:  1. Recent CVA - seems to be recovering. Would continue with his stroke rehab therapies  2. Chronic atrial fib - rate is ok for now.   3. Chronic anticoagulation - now on Xarelto.   I think he is doing well. We will check follow up labs today. He has had his Digoxin just prior to his visit today. We will see him back in about 6 weeks. No changes in his medicines today.   Patient is agreeable to this plan and will call if any problems develop in the interim.

## 2012-09-13 NOTE — Patient Instructions (Addendum)
Keep up the therapy.  Stay on your current medicines  We need to check some labs today  We will see you back in 6 weeks.  Call the [facility] office at [phone] if you have any questions, problems or concerns.

## 2012-09-14 ENCOUNTER — Telehealth: Payer: Self-pay | Admitting: *Deleted

## 2012-09-14 NOTE — Telephone Encounter (Signed)
Message copied by Awilda Bill on Thu Sep 14, 2012 12:04 PM ------      Message from: Rosalio Macadamia      Created: Wed Sep 13, 2012  1:16 PM       Ok to report. Labs are satisfactory.

## 2012-09-14 NOTE — Telephone Encounter (Signed)
Pt notified of lab results.  Cintya Daughety, CMA 

## 2012-09-17 ENCOUNTER — Encounter: Payer: Self-pay | Admitting: Internal Medicine

## 2012-09-17 DIAGNOSIS — R351 Nocturia: Secondary | ICD-10-CM | POA: Insufficient documentation

## 2012-09-17 DIAGNOSIS — Z0000 Encounter for general adult medical examination without abnormal findings: Secondary | ICD-10-CM | POA: Insufficient documentation

## 2012-09-17 NOTE — Assessment & Plan Note (Signed)

## 2012-09-17 NOTE — Assessment & Plan Note (Signed)
Also with hx of bladder cancer, to f/u urology, declines trial flomax

## 2012-09-17 NOTE — Progress Notes (Signed)
Subjective:    Patient ID: [name], male    DOB: Oct 09, 1927, 78 y.o.   MRN: [id number]  HPI  Here for wellness and f/u;  Overall doing ok;  Pt denies CP, worsening SOB, DOE, wheezing, orthopnea, PND, worsening LE edema, palpitations, dizziness or syncope.  Pt denies neurological change such as new Headache, facial or extremity weakness.  Pt denies polydipsia, polyuria, or low sugar symptoms. Pt states overall good compliance with treatment and medications, good tolerability, and trying to follow lower cholesterol diet.  Pt denies worsening depressive symptoms, suicidal ideation or panic. No fever, wt loss, night sweats, loss of appetite, or other constitutional symptoms.  Pt states fair ability with ADL's, low to mod fall risk, home safety reviewed and adequate, no significant changes in hearing or vision, and occasionally active with exercise.  Had Recent acute CVA, small nonhemorrhagic right on MRI, with recent hospn with afib with RVR, with coumadin changed to xarelto, to cont PT for balance, speech and swallow issue residuals, has small left side weakness. Also with recent gout left knee now improved.   Denies urinary symptoms such as dysuria, frequency, urgency,or hematuria after recent UTI.  Has known bladder ca, missed appt with [name] due to hospn, but plans to re-sched soon.  Has nocturia and slower stream in last 6 mo or so, but does not want to consider any change in tx today.  To see dr Doroteo Bradford as well for rehab purpose.  Wt has changed, documented 134 in April 2012, now 127.  Declines flu shot.  Right ear has hearing problem with wax impaction today Past Medical History  Diagnosis Date  . NEOPLASM, MALIGNANT, BLADDER 04/10/2009  . HYPERLIPIDEMIA 04/10/2009  . ANEMIA-NOS 04/10/2009  . 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 ANXIETY 04/10/2009  . HYPERTENSION 04/10/2009  . Atrial fibrillation 04/10/2009  . COPD 04/10/2009  . TRANSIENT ISCHEMIC ATTACK, HX OF 04/10/2009  . Bladder  cancer     Dr. Darvin Neighbours  . Lacunar stroke 04/07/2011  . Pleural effusion 04/07/2011  . 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 Splenectomy   . Bilateral vats ablation   . Facial cancer     facial skin cancer    reports that he has never smoked. He does not have any smokeless tobacco history on file. He reports that he does not drink alcohol. His drug history not on file. family history includes Arthritis in his father and Heart disease in his mother. Allergies  Allergen Reactions  . Diazepam     REACTION: agitation  . Ezetimibe-Simvastatin   . Morphine Other (See Comments)    Hallucinations.  . Sulfonamide Derivatives Hives   Current Outpatient Prescriptions on File Prior to Visit  Medication Sig Dispense Refill  . Cholecalciferol (VITAMIN D) 2000 UNITS tablet Take 2,000-3,000 Units by mouth daily as needed. Patient takes 2000 units daily, except for taking 3000 units (1.5 tablets) on "winter days" and days when "not feeling well."      . digoxin (LANOXIN) 0.125 MG tablet Take 1 tablet (0.125 mg total) by mouth daily.  30 tablet  1  . diltiazem (TIAZAC) 120 MG 24 hr capsule Take 120 mg by mouth daily.      . Rivaroxaban (XARELTO) 15 MG TABS tablet Take 1 tablet (15 mg total) by mouth daily.  30 tablet  1  . senna-docusate (SENOKOT-S) 8.6-50 MG per tablet Take 1 tablet by mouth at bedtime.       Review  of Systems Review of Systems  Constitutional: Negative for diaphoresis, activity change, appetite change.  HENT: Negative for hearing loss, ear pain, facial swelling, mouth sores and neck stiffness.   Eyes: Negative for pain, redness and visual disturbance.  Respiratory: Negative for shortness of breath and wheezing.   Cardiovascular: Negative for chest pain and palpitations.  Gastrointestinal: Negative for diarrhea, blood in stool, abdominal distention and rectal pain.  Genitourinary: Negative for hematuria, flank pain and decreased urine volume.    Musculoskeletal: Negative for myalgias and joint swelling.  Skin: Negative for color change and wound.  Neurological: Negative for syncope Hematological: Negative for adenopathy.  Psychiatric/Behavioral: Negative for hallucinations, self-injury, decreased concentration and agitation.     Objective:   Physical Exam BP 120/62  Pulse 95  Temp 97 F (36.1 C) (Oral)  Ht 5\' 9"  (1.753 m)  Wt 127 lb 2 oz (57.664 kg)  BMI 18.77 kg/m2  SpO2 97% Physical Exam  VS noted Constitutional: Pt is oriented to person, place, and time. Appears well-developed and well-nourished. [name] Head: Normocephalic and atraumatic.  Right Ear: External ear normal.  Left Ear: External ear normal.  Nose: Nose normal.  Mouth/Throat: Oropharynx is clear and moist.  Right ear wax impaction removed with irrigation, hearing improved Eyes: Conjunctivae and EOM are normal. Pupils are equal, round, and reactive to light.  Neck: Normal range of motion. Neck supple. No JVD present. No tracheal deviation present.  Cardiovascular: Normal rate, regular rhythm, normal heart sounds and intact distal pulses.   Pulmonary/Chest: Effort normal and breath sounds normal.  Abdominal: Soft. Bowel sounds are normal. There is no tenderness.  Musculoskeletal: Normal range of motion. Exhibits no edema.  Lymphadenopathy:  Has no cervical adenopathy.  Neurological: Pt is alert and oriented to person, place, and time. Pt has normal reflexes. O/w not done in detail\ Left knee without effusion or tenderness.  Skin: Skin is warm and dry. No rash noted.  Psychiatric: . Behavior is normal. not depressed affect    Assessment & Plan:

## 2012-09-18 ENCOUNTER — Encounter: Payer: Self-pay | Admitting: Physical Medicine & Rehabilitation

## 2012-09-18 ENCOUNTER — Encounter: Payer: Medicare Other | Attending: Physical Medicine & Rehabilitation

## 2012-09-18 ENCOUNTER — Ambulatory Visit (HOSPITAL_BASED_OUTPATIENT_CLINIC_OR_DEPARTMENT_OTHER): Payer: Medicare Other | Admitting: Physical Medicine & Rehabilitation

## 2012-09-18 VITALS — BP 134/82 | HR 83 | Resp 14 | Ht 69.0 in | Wt 126.6 lb

## 2012-09-18 DIAGNOSIS — I4891 Unspecified atrial fibrillation: Secondary | ICD-10-CM | POA: Insufficient documentation

## 2012-09-18 DIAGNOSIS — J4489 Other specified chronic obstructive pulmonary disease: Secondary | ICD-10-CM | POA: Insufficient documentation

## 2012-09-18 DIAGNOSIS — I69959 Hemiplegia and hemiparesis following unspecified cerebrovascular disease affecting unspecified side: Secondary | ICD-10-CM | POA: Insufficient documentation

## 2012-09-18 DIAGNOSIS — J449 Chronic obstructive pulmonary disease, unspecified: Secondary | ICD-10-CM | POA: Insufficient documentation

## 2012-09-18 DIAGNOSIS — I10 Essential (primary) hypertension: Secondary | ICD-10-CM | POA: Insufficient documentation

## 2012-09-18 DIAGNOSIS — I6350 Cerebral infarction due to unspecified occlusion or stenosis of unspecified cerebral artery: Secondary | ICD-10-CM

## 2012-09-18 DIAGNOSIS — R269 Unspecified abnormalities of gait and mobility: Secondary | ICD-10-CM | POA: Insufficient documentation

## 2012-09-18 DIAGNOSIS — I639 Cerebral infarction, unspecified: Secondary | ICD-10-CM

## 2012-09-18 DIAGNOSIS — I69998 Other sequelae following unspecified cerebrovascular disease: Secondary | ICD-10-CM | POA: Insufficient documentation

## 2012-09-18 DIAGNOSIS — E785 Hyperlipidemia, unspecified: Secondary | ICD-10-CM | POA: Insufficient documentation

## 2012-09-18 DIAGNOSIS — Z8551 Personal history of malignant neoplasm of bladder: Secondary | ICD-10-CM | POA: Insufficient documentation

## 2012-09-18 NOTE — Progress Notes (Signed)
Subjective:    Patient ID: [name], male    DOB: July 01, 1927, 78 y.o.   MRN: [id number]  HPI [name] is an 78 year old male  with history of AFib, on chronic Coumadin as well as history of TIAs in  the past. He was admitted on August 17, 2012, with slurred speech and  altered mental status. An MRI of brain done, revealed small  nonhemorrhagic acute infarcts in superior right lenticular nucleus and  posterior right corona radiata He has received home health therapy since being discharged from the hospital. He is interested in attending outpatient therapy. He still has problems with balance as well as some mild weakness on left side.  Pain Inventory Average Pain 2 Pain Right Now 2 My pain is intermittent and dull  Knee pain mostly when standing  In the last 24 hours, has pain interfered with the following? General activity 0 Relation with others 0 Enjoyment of life 0 What TIME of day is your pain at its worst? daytime Sleep (in general) Fair  Pain is worse with: walking and standing Pain improves with: rest Relief from Meds: no medication  Mobility walk without assistance how many minutes can you walk? 20 ability to climb steps?  yes do you drive?  no  Function retired  Neuro/Psych bladder control problems trouble walking depression  Prior Studies Any changes since last visit?  no  Physicians involved in your care Any changes since last visit?  no   Family History  Problem Relation Age of Onset  . Heart disease Mother   . Arthritis Father    History   Social History  . Marital Status: Married    Spouse Name: N/A    Number of Children: N/A  . Years of Education: N/A   Occupational History  . retired - Company secretary - Merchandiser, retail for [employer]    Social History Main Topics  . Smoking status: Never Smoker   . Smokeless tobacco: None  . Alcohol Use: No  . Drug Use: None  . Sexually Active: None   Other Topics Concern  . None     Social History Narrative   Wife is Majd Tissue - pt. Of [place]   Past Surgical History  Procedure Date  . Splenectomy   . Bilateral vats ablation   . Facial cancer     facial skin cancer   Past Medical History  Diagnosis Date  . NEOPLASM, MALIGNANT, BLADDER 04/10/2009  . HYPERLIPIDEMIA 04/10/2009  . ANEMIA-NOS 04/10/2009  . 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 ANXIETY 04/10/2009  . HYPERTENSION 04/10/2009  . Atrial fibrillation 04/10/2009  . COPD 04/10/2009  . TRANSIENT ISCHEMIC ATTACK, HX OF 04/10/2009  . Bladder cancer     Dr. Darvin Neighbours  . Lacunar stroke 04/07/2011  . Pleural effusion 04/07/2011  . 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 Stroke    BP 134/82  Pulse 83  Resp 14  Ht 5\' 9"  (1.753 m)  Wt 126 lb 9.6 oz (57.425 kg)  BMI 18.70 kg/m2  SpO2 100%   Review of Systems  Musculoskeletal:       Knee pain  Neurological:       Still has some drooling from left side  Psychiatric/Behavioral: Positive for dysphoric mood.  All other systems reviewed and are negative.       Objective:   Physical Exam  Constitutional: He is oriented to person, place, and time. He appears well-developed and well-nourished.  HENT:  Head: Normocephalic and atraumatic.  Eyes:  Conjunctivae normal and EOM are normal. Pupils are equal, round, and reactive to light.  Neurological: He is alert and oriented to person, place, and time. No sensory deficit. Gait abnormal.       4/5 strength in the  Psychiatric: He has a normal mood and affect.   Left upper and left lower extremity are 4/5 right upper and right lower extremity are 5/5       Assessment & Plan:  1. Right subcortical stroke causing mild left hemiparesis as well as gait disturbance. He is still not at his baseline level. I advised no driving. He has received maximal benefit from both inpatient and home health therapy but may get further functional gains by attending outpatient therapy. I'll see him back in one month

## 2012-09-18 NOTE — Patient Instructions (Signed)
I have referred you to the [facility] outpatient [facility]

## 2012-09-22 ENCOUNTER — Telehealth: Payer: Self-pay | Admitting: Physical Medicine & Rehabilitation

## 2012-09-22 NOTE — Telephone Encounter (Signed)
Amy/[facility],PT  [phone]

## 2012-09-22 NOTE — Telephone Encounter (Signed)
Who was the caller?

## 2012-09-22 NOTE — Telephone Encounter (Signed)
OK to order outpt PT,OT,ST at [facility]

## 2012-09-22 NOTE — Telephone Encounter (Signed)
Please order OP OT and Speech Therapy.

## 2012-09-22 NOTE — Telephone Encounter (Signed)
Needs order for OT and Speech

## 2012-09-27 ENCOUNTER — Telehealth: Payer: Self-pay | Admitting: Physical Medicine & Rehabilitation

## 2012-09-27 DIAGNOSIS — I639 Cerebral infarction, unspecified: Secondary | ICD-10-CM

## 2012-09-27 NOTE — Telephone Encounter (Signed)
Pt wife aware that the referrals have been placed and updated.

## 2012-09-27 NOTE — Telephone Encounter (Signed)
Patient's wife called and said that he does not need OT at this time, but still needs speech therapy referral and please update the PT referral.

## 2012-09-28 ENCOUNTER — Other Ambulatory Visit: Payer: Self-pay | Admitting: Cardiology

## 2012-09-29 ENCOUNTER — Telehealth: Payer: Self-pay | Admitting: *Deleted

## 2012-09-29 NOTE — Telephone Encounter (Signed)
PT can't get them in until October 14th. She isn't happy that they are having to wait. Please call.

## 2012-09-29 NOTE — Telephone Encounter (Signed)
Spoke to pt wife and she is going to call [facility] and see if they can get him in sooner. If they can't she is just going to keep appointment on 10/09/12 with Neuro Rehab.

## 2012-10-02 ENCOUNTER — Ambulatory Visit: Payer: Medicare Other | Attending: Physical Medicine & Rehabilitation | Admitting: Speech Pathology

## 2012-10-02 DIAGNOSIS — I69991 Dysphagia following unspecified cerebrovascular disease: Secondary | ICD-10-CM | POA: Insufficient documentation

## 2012-10-02 DIAGNOSIS — R269 Unspecified abnormalities of gait and mobility: Secondary | ICD-10-CM | POA: Insufficient documentation

## 2012-10-02 DIAGNOSIS — M6281 Muscle weakness (generalized): Secondary | ICD-10-CM | POA: Insufficient documentation

## 2012-10-02 DIAGNOSIS — R1310 Dysphagia, unspecified: Secondary | ICD-10-CM | POA: Insufficient documentation

## 2012-10-02 DIAGNOSIS — Z5189 Encounter for other specified aftercare: Secondary | ICD-10-CM | POA: Insufficient documentation

## 2012-10-02 DIAGNOSIS — I69998 Other sequelae following unspecified cerebrovascular disease: Secondary | ICD-10-CM | POA: Insufficient documentation

## 2012-10-04 ENCOUNTER — Ambulatory Visit: Payer: Medicare Other | Admitting: Speech Pathology

## 2012-10-09 ENCOUNTER — Ambulatory Visit: Payer: Medicare Other | Admitting: Physical Therapy

## 2012-10-09 ENCOUNTER — Other Ambulatory Visit: Payer: Self-pay | Admitting: Cardiology

## 2012-10-09 ENCOUNTER — Ambulatory Visit: Payer: Medicare Other | Admitting: Speech Pathology

## 2012-10-11 ENCOUNTER — Ambulatory Visit: Payer: Medicare Other | Admitting: Physical Therapy

## 2012-10-16 ENCOUNTER — Ambulatory Visit (INDEPENDENT_AMBULATORY_CARE_PROVIDER_SITE_OTHER): Payer: Medicare Other | Admitting: Cardiology

## 2012-10-16 ENCOUNTER — Encounter: Payer: Self-pay | Admitting: Cardiology

## 2012-10-16 VITALS — BP 123/79 | HR 118 | Ht 69.0 in | Wt 127.8 lb

## 2012-10-16 DIAGNOSIS — I6381 Other cerebral infarction due to occlusion or stenosis of small artery: Secondary | ICD-10-CM

## 2012-10-16 DIAGNOSIS — C679 Malignant neoplasm of bladder, unspecified: Secondary | ICD-10-CM

## 2012-10-16 DIAGNOSIS — I4892 Unspecified atrial flutter: Secondary | ICD-10-CM

## 2012-10-16 DIAGNOSIS — I4891 Unspecified atrial fibrillation: Secondary | ICD-10-CM

## 2012-10-16 DIAGNOSIS — K5900 Constipation, unspecified: Secondary | ICD-10-CM

## 2012-10-16 DIAGNOSIS — Z8679 Personal history of other diseases of the circulatory system: Secondary | ICD-10-CM

## 2012-10-16 DIAGNOSIS — I6350 Cerebral infarction due to unspecified occlusion or stenosis of unspecified cerebral artery: Secondary | ICD-10-CM

## 2012-10-16 NOTE — Assessment & Plan Note (Signed)
The patient has a history of malignant bladder tumor.  He is followed by Dr. Earlene Plater.  He had a recent visit with Dr. Earlene Plater and had a good report.  The patient has had no gross hematuria recently

## 2012-10-16 NOTE — Assessment & Plan Note (Signed)
The patient has had chronic problems with constipation.  He is wondering whether he could take some probiotics to see if this would help.  I told him that that would be okay.

## 2012-10-16 NOTE — Progress Notes (Signed)
[name] Date of Birth:  [birth year] [facility] [address] [place], [place]  [address] [phone]         Fax   [phone]  History of Present Illness: Mr. Walek is seen back today for a followup office visit. Previously followed by Dr. Tonie Griffith and Arnette Felts in [facility]. Has had chronic atrial fib, difficult to control his rate. Previously on coumadin. Now with recent CVA. He had a right small nonhemorrhagic infarct on MRI. He had been on coumadin but INR was 1.6. He is now on Xarelto. His other problems include CRI, HLD, anemia, HTN, COPD, remote ITP with prior spleenectomy and pseudogout. He has had a remote echo back in 2010 showing an EF of 45 to 50%. He also has bladder cancer and is followed by Dr. Earlene Plater in [place].  He comes in today. He is here with his wife. She provides a lot of history. She feels like he is doing well. No chest pain. Not short of breath. Tolerating his rehab.  He is now getting rehabilitation at the neurological rehabilitation Center. She feels like he is getting stronger. He had had some right sided weakness and dysphagia. Had lots of difficulty with controlling his rate while in rehab at [facility]. Now on low dose CCB and digoxin. He is now in chronic atrial fib. Tolerating Xarelto without issue. He is only on 15 mg due to his kidneys.  He is interested in participating in the Orbit II clinical trial.   Current Outpatient Prescriptions  Medication Sig Dispense Refill  . Cholecalciferol (VITAMIN D) 2000 UNITS tablet Take 2,000-3,000 Units by mouth daily as needed. Patient takes 2000 units daily, except for taking 3000 units (1.5 tablets) on "winter days" and days when "not feeling well."      . digoxin (LANOXIN) 0.125 MG tablet Take 1 tablet (0.125 mg total) by mouth daily.  30 tablet  1  . diltiazem (CARDIZEM CD) 120 MG 24 hr capsule TAKE ONE CAPSULE BY MOUTH AS DIRECTED  30 capsule  3  . Probiotic Product (PROBIOTIC DAILY PO) Take by  mouth.      . senna-docusate (SENOKOT-S) 8.6-50 MG per tablet Take 1 tablet by mouth at bedtime.      Carlena Hurl 15 MG TABS tablet TAKE 1 TABLET BY MOUTH EVERY DAY  30 tablet  9    Allergies  Allergen Reactions  . Diazepam     REACTION: agitation  . Ezetimibe-Simvastatin   . Morphine Other (See Comments)    Hallucinations.  . Sulfonamide Derivatives Hives    Patient Active Problem List  Diagnosis  . NEOPLASM, MALIGNANT, BLADDER  . HYPERLIPIDEMIA  . ANEMIA-NOS  . Immune thrombocytopenic purpura  . ANXIETY  . HYPERTENSION  . Atrial fibrillation with RVR  . COPD  . TRANSIENT ISCHEMIC ATTACK, HX OF  . Lacunar stroke  . Pleural effusion  . Post-splenectomy  . Long term current use of anticoagulant  . CVA (cerebral vascular accident)-Acute right small nonhemorrhagic infarct seen on MRI  . Encephalopathy  . Left knee pain  . Prerenal azotemia  . UTI (lower urinary tract infection)  . Leukocytosis  . Pseudogout  . Preventative health care  . Nocturia    History  Smoking status  . Never Smoker   Smokeless tobacco  . Not on file    History  Alcohol Use No    Family History  Problem Relation Age of Onset  . Heart disease Mother   . Arthritis  Father     Review of Systems: Constitutional: no fever chills diaphoresis or fatigue or change in weight.  Head and neck: no hearing loss, no epistaxis, no photophobia or visual disturbance. Respiratory: No cough, shortness of breath or wheezing. Cardiovascular: No chest pain peripheral edema, palpitations. Gastrointestinal: No abdominal distention, no abdominal pain, no change in bowel habits hematochezia or melena. Genitourinary: No dysuria, no frequency, no urgency, no nocturia. Musculoskeletal:No arthralgias, no back pain, no gait disturbance or myalgias. Neurological: No dizziness, no headaches, no numbness, no seizures, no syncope, no weakness, no tremors. Hematologic: No lymphadenopathy, no easy  bruising. Psychiatric: No confusion, no hallucinations, no sleep disturbance.    Physical Exam: Filed Vitals:   10/16/12 0953  BP: 123/79  Pulse: 118   the general appearance reveals an alert elderly gentleman in no distress.The head and neck exam reveals pupils equal and reactive.  Extraocular movements are full.  There is no scleral icterus.  The mouth and pharynx are normal.  The neck is supple.  The carotids reveal no bruits.  The jugular venous pressure is normal.  The  thyroid is not enlarged.  There is no lymphadenopathy.  The chest is clear to percussion and auscultation.  There are no rales or rhonchi.  Expansion of the chest is symmetrical.  The precordium is quiet.  The first heart sound is normal.  The second heart sound is physiologically split.  There is no murmur gallop rub or click.  There is no abnormal lift or heave.  The abdomen is soft and nontender.  The bowel sounds are normal.  The liver and spleen are not enlarged.  There are no abdominal masses.  There are no abdominal bruits.  Extremities reveal good pedal pulses.  There is no phlebitis or edema.  There is no cyanosis or clubbing.  Strength is normal and symmetrical in all extremities.  There is no lateralizing weakness.  There are no sensory deficits.  The skin is warm and dry.  There is no rash.     Assessment / Plan: Continue same medication.  Okay to refer her to injure the orbit-AF II trial.  Recheck here in 2 months for followup office visit EKG and basal metabolic panel.  Because of renal insufficiency his dose of Xarelto is 15 mg

## 2012-10-16 NOTE — Assessment & Plan Note (Signed)
Today in the office his heart rate was elevated after walking to the exam room.  His wife states that at home his parents is in the 59s.  In the hospital when we tried to get his heart rate down lower we developed problems with excessive bradycardia and long pauses.  The patient is tolerating his present heart rate without any symptoms and we will leave him on his current dose of diltiazem and digoxin

## 2012-10-16 NOTE — Patient Instructions (Signed)
Will add Probiotic daily  Your physician recommends that you schedule a follow-up appointment in: 2 month ov/ekg/bmet

## 2012-10-16 NOTE — Assessment & Plan Note (Signed)
The patient has had no further evidence of new TIA or stroke symptoms.  He is not having any problems from the anticoagulation with Xarelto

## 2012-10-17 ENCOUNTER — Encounter: Payer: Medicare Other | Attending: Physical Medicine & Rehabilitation

## 2012-10-17 ENCOUNTER — Ambulatory Visit (HOSPITAL_BASED_OUTPATIENT_CLINIC_OR_DEPARTMENT_OTHER): Payer: Medicare Other | Admitting: Physical Medicine & Rehabilitation

## 2012-10-17 ENCOUNTER — Encounter: Payer: Self-pay | Admitting: Physical Medicine & Rehabilitation

## 2012-10-17 VITALS — BP 125/81 | HR 96 | Resp 14 | Wt 128.0 lb

## 2012-10-17 DIAGNOSIS — J4489 Other specified chronic obstructive pulmonary disease: Secondary | ICD-10-CM | POA: Insufficient documentation

## 2012-10-17 DIAGNOSIS — I10 Essential (primary) hypertension: Secondary | ICD-10-CM | POA: Insufficient documentation

## 2012-10-17 DIAGNOSIS — Z8551 Personal history of malignant neoplasm of bladder: Secondary | ICD-10-CM | POA: Insufficient documentation

## 2012-10-17 DIAGNOSIS — I4891 Unspecified atrial fibrillation: Secondary | ICD-10-CM | POA: Insufficient documentation

## 2012-10-17 DIAGNOSIS — R269 Unspecified abnormalities of gait and mobility: Secondary | ICD-10-CM | POA: Insufficient documentation

## 2012-10-17 DIAGNOSIS — I639 Cerebral infarction, unspecified: Secondary | ICD-10-CM

## 2012-10-17 DIAGNOSIS — E785 Hyperlipidemia, unspecified: Secondary | ICD-10-CM | POA: Insufficient documentation

## 2012-10-17 DIAGNOSIS — I69959 Hemiplegia and hemiparesis following unspecified cerebrovascular disease affecting unspecified side: Secondary | ICD-10-CM | POA: Insufficient documentation

## 2012-10-17 DIAGNOSIS — I6350 Cerebral infarction due to unspecified occlusion or stenosis of unspecified cerebral artery: Secondary | ICD-10-CM

## 2012-10-17 DIAGNOSIS — N319 Neuromuscular dysfunction of bladder, unspecified: Secondary | ICD-10-CM

## 2012-10-17 DIAGNOSIS — J449 Chronic obstructive pulmonary disease, unspecified: Secondary | ICD-10-CM | POA: Insufficient documentation

## 2012-10-17 DIAGNOSIS — I69998 Other sequelae following unspecified cerebrovascular disease: Secondary | ICD-10-CM | POA: Insufficient documentation

## 2012-10-17 MED ORDER — OXYBUTYNIN CHLORIDE 5 MG PO TABS: 5.0000 mg | ORAL_TABLET | Freq: Every day | ORAL | Status: DC

## 2012-10-17 NOTE — Patient Instructions (Addendum)
Oxybutynin tablets What is this medicine? OXYBUTYNIN (ox i BYOO ti nin) is used to treat overactive bladder. This medicine reduces the amount of bathroom visits. It may also help to control wetting accidents. This medicine may be used for other purposes; ask your health care provider or pharmacist if you have questions. What should I tell my health care provider before I take this medicine? They need to know if you have any of these conditions: -dementia -difficulty passing urine -glaucoma -intestinal obstruction -kidney disease -liver disease -an unusual or allergic reaction to oxybutynin, other medicines, foods, dyes, or preservatives -pregnant or trying to get pregnant -breast-feeding How should I use this medicine? Take this medicine by mouth with a glass of water. Follow the directions on the prescription label. You can take this medicine with or without food. Take your medicine at regular intervals. Do not take your medicine more often than directed. Talk to your pediatrician regarding the use of this medicine in children. Special care may be needed. While this drug may be prescribed for children as young as 5 years for selected conditions, precautions do apply. Overdosage: If you think you have taken too much of this medicine contact a poison control center or emergency room at once. NOTE: This medicine is only for you. Do not share this medicine with others. What if I miss a dose? If you miss a dose, take it as soon as you can. If it is almost time for your next dose, take only that dose. Do not take double or extra doses. What may interact with this medicine? -antihistamines for allergy, cough and cold -atropine -certain medicines for bladder problems like oxybutynin, tolterodine -certain medicines for Parkinson's disease like benztropine, trihexyphenidyl -certain medicines for stomach problems like dicyclomine, hyoscyamine -certain medicines for travel sickness like  scopolamine -clarithromycin -erythromycin -ipratropium -medicines for fungal infections, like fluconazole, itraconazole, ketoconazole or voriconazole This list may not describe all possible interactions. Give your health care provider a list of all the medicines, herbs, non-prescription drugs, or dietary supplements you use. Also tell them if you smoke, drink alcohol, or use illegal drugs. Some items may interact with your medicine. What should I watch for while using this medicine? It may take a few weeks to notice the full benefit from this medicine. You may need to limit your intake tea, coffee, caffeinated sodas, and alcohol. These drinks may make your symptoms worse. You may get drowsy or dizzy. Do not drive, use machinery, or do anything that needs mental alertness until you know how this medicine affects you. Do not stand or sit up quickly, especially if you are an older patient. This reduces the risk of dizzy or fainting spells. Alcohol may interfere with the effect of this medicine. Avoid alcoholic drinks. Your mouth may get dry. Chewing sugarless gum or sucking hard candy, and drinking plenty of water may help. Contact your doctor if the problem does not go away or is severe. This medicine may cause dry eyes and blurred vision. If you wear contact lenses, you may feel some discomfort. Lubricating drops may help. See your eyecare professional if the problem does not go away or is severe. Avoid extreme heat. This medicine can cause you to sweat less than normal. Your body temperature could increase to dangerous levels, which may lead to heat stroke. What side effects may I notice from receiving this medicine? Side effects that you should report to your doctor or health care professional as soon as possible: -allergic reactions like skin  rash, itching or hives, swelling of the face, lips, or tongue -agitation -breathing problems -confusion -fever -flushing (reddening of the  skin) -hallucinations -memory loss -pain or difficulty passing urine -palpitations -unusually weak or tired Side effects that usually do not require medical attention (report to your doctor or health care professional if they continue or are bothersome): -constipation -headache -sexual difficulties (impotence) This list may not describe all possible side effects. Call your doctor for medical advice about side effects. You may report side effects to FDA at 1-800-FDA-1088. Where should I keep my medicine? Keep out of the reach of children. Store at room temperature between 15 and 30 degrees C (59 and 86 degrees F). Protect from moisture and humidity. Throw away any unused medicine after the expiration date. NOTE: This sheet is a summary. It may not cover all possible information. If you have questions about this medicine, talk to your doctor, pharmacist, or health care provider.  2012, [employer] Standard. (07/09/2008 4:50:01 PM)

## 2012-10-17 NOTE — Progress Notes (Signed)
Subjective:    Patient ID: [name], male    DOB: [birth year], 78 y.o.   MRN: [id number]  HPI Primary complaint is urinary urgency. Had a incontinent episode after getting back from the doctor yesterday. Is up all night urinating. Has seen his urologist. Has bladder cancer. No new issues or no new treatments Continues with outpatient physical therapy. No falls at home Pain Inventory Average Pain 0 Pain Right Now 0 My pain is aching and had headaches a couple of nights  In the last 24 hours, has pain interfered with the following? General activity 2 Relation with others 2 Enjoyment of life 2 What TIME of day is your pain at its worst? daytime Sleep (in general) Fair  Pain is worse with: walking and standing Pain improves with: no pain Relief from Meds: no pain  Mobility walk without assistance ability to climb steps?  yes do you drive?  yes  Function retired I need assistance with the following:  meal prep  Neuro/Psych bladder control problems weakness  Prior Studies Any changes since last visit?  no  Physicians involved in your care Any changes since last visit?  no   Family History  Problem Relation Age of Onset  . Heart disease Mother   . Arthritis Father    History   Social History  . Marital Status: Married    Spouse Name: N/A    Number of Children: N/A  . Years of Education: N/A   Occupational History  . retired - Company secretary - Merchandiser, retail for [employer]    Social History Main Topics  . Smoking status: Never Smoker   . Smokeless tobacco: Never Used  . Alcohol Use: No  . Drug Use: None  . Sexually Active: None   Other Topics Concern  . None   Social History Narrative   Wife is [name] - pt. Of [place]   Past Surgical History  Procedure Date  . Splenectomy   . Bilateral vats ablation   . Facial cancer     facial skin cancer   Past Medical History  Diagnosis Date  . NEOPLASM, MALIGNANT, BLADDER 04/10/2009  .  HYPERLIPIDEMIA 04/10/2009  . ANEMIA-NOS 04/10/2009  . 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 ANXIETY 04/10/2009  . HYPERTENSION 04/10/2009  . Atrial fibrillation 04/10/2009  . COPD 04/10/2009  . TRANSIENT ISCHEMIC ATTACK, HX OF 04/10/2009  . Bladder cancer     Dr. Darvin Neighbours  . Lacunar stroke 04/07/2011  . Pleural effusion 04/07/2011  . 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 Stroke    BP 125/81  Pulse 96  Resp 14  Wt 128 lb (58.06 kg)  SpO2 98%    Review of Systems  Gastrointestinal: Positive for constipation.  Genitourinary: Positive for urgency.  Neurological: Positive for weakness.  All other systems reviewed and are negative.       Objective:   Physical Exam  Constitutional: He is oriented to person, place, and time. He appears well-developed.  Neurological: He is alert and oriented to person, place, and time. He displays tremor. No sensory deficit. He exhibits abnormal muscle tone. Coordination and gait abnormal.       Decreased coordination, mild tremor, 4/5 grip in the left hand Gait is Kyphotic-appearing Hip flexor, knee extensor and ankle dorsiflexor plantar flexor strength is normal  Psychiatric: He has a normal mood and affect.          Assessment & Plan:  1.RightCorona radiata and basal ganglia infarct causing residual left hand fine  motor deficits and increased tone as well as mild tremor. 2. 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 We discussed treatment options for his urinary urgency including condom catheter placement during nighttime which he does not want to do. We discussed anticholinergic medications and potential side effects. We will trial him on a small dose of oxybutynin 5 mg at night only  Monitor for retention monitor for confusion. Full list of potential side effects was given to patient and his wife I'll see him back in 6 weeks.

## 2012-10-18 ENCOUNTER — Ambulatory Visit: Payer: Medicare Other | Admitting: Speech Pathology

## 2012-10-18 ENCOUNTER — Ambulatory Visit: Payer: Medicare Other | Admitting: Physical Therapy

## 2012-10-23 ENCOUNTER — Ambulatory Visit: Payer: Medicare Other | Admitting: Physical Therapy

## 2012-10-23 ENCOUNTER — Ambulatory Visit: Payer: Medicare Other | Admitting: Speech Pathology

## 2012-10-25 ENCOUNTER — Ambulatory Visit: Payer: Medicare Other | Admitting: Physical Therapy

## 2012-10-25 ENCOUNTER — Ambulatory Visit: Payer: Medicare Other | Admitting: Speech Pathology

## 2012-10-27 ENCOUNTER — Other Ambulatory Visit: Payer: Self-pay | Admitting: Cardiology

## 2012-10-30 ENCOUNTER — Ambulatory Visit: Payer: Medicare Other | Attending: Internal Medicine | Admitting: Speech Pathology

## 2012-10-30 ENCOUNTER — Ambulatory Visit: Payer: Medicare Other | Admitting: Physical Therapy

## 2012-10-30 DIAGNOSIS — R1312 Dysphagia, oropharyngeal phase: Secondary | ICD-10-CM | POA: Insufficient documentation

## 2012-10-30 DIAGNOSIS — I69919 Unspecified symptoms and signs involving cognitive functions following unspecified cerebrovascular disease: Secondary | ICD-10-CM | POA: Insufficient documentation

## 2012-10-30 DIAGNOSIS — I69991 Dysphagia following unspecified cerebrovascular disease: Secondary | ICD-10-CM | POA: Insufficient documentation

## 2012-10-30 DIAGNOSIS — IMO0001 Reserved for inherently not codable concepts without codable children: Secondary | ICD-10-CM | POA: Insufficient documentation

## 2012-11-02 ENCOUNTER — Ambulatory Visit: Payer: Medicare Other | Admitting: Physical Therapy

## 2012-11-02 ENCOUNTER — Ambulatory Visit: Payer: Medicare Other

## 2012-11-08 ENCOUNTER — Encounter: Payer: Self-pay | Admitting: Cardiology

## 2012-11-08 ENCOUNTER — Telehealth: Payer: Self-pay | Admitting: *Deleted

## 2012-11-08 ENCOUNTER — Ambulatory Visit: Payer: Medicare Other | Admitting: Speech Pathology

## 2012-11-08 NOTE — Telephone Encounter (Signed)
Phone number [phone] xarelton Medicare [id number]  Medicare ID # Question about prior authorization. Wife states that prior authorization had been approved. Will send to [facility] to see if she knows anything about the prior authorization.

## 2012-11-10 NOTE — Telephone Encounter (Signed)
Approved # [id number] good until November 15 ,2014. Advised wife

## 2012-11-14 ENCOUNTER — Encounter: Payer: Medicare Other | Attending: Physical Medicine & Rehabilitation

## 2012-11-14 ENCOUNTER — Encounter: Payer: Self-pay | Admitting: Physical Medicine & Rehabilitation

## 2012-11-14 ENCOUNTER — Ambulatory Visit (HOSPITAL_BASED_OUTPATIENT_CLINIC_OR_DEPARTMENT_OTHER): Payer: Medicare Other | Admitting: Physical Medicine & Rehabilitation

## 2012-11-14 VITALS — Resp 14 | Ht 69.0 in | Wt 127.4 lb

## 2012-11-14 DIAGNOSIS — I639 Cerebral infarction, unspecified: Secondary | ICD-10-CM

## 2012-11-14 DIAGNOSIS — I4891 Unspecified atrial fibrillation: Secondary | ICD-10-CM | POA: Insufficient documentation

## 2012-11-14 DIAGNOSIS — I10 Essential (primary) hypertension: Secondary | ICD-10-CM | POA: Insufficient documentation

## 2012-11-14 DIAGNOSIS — J4489 Other specified chronic obstructive pulmonary disease: Secondary | ICD-10-CM | POA: Insufficient documentation

## 2012-11-14 DIAGNOSIS — I69998 Other sequelae following unspecified cerebrovascular disease: Secondary | ICD-10-CM | POA: Insufficient documentation

## 2012-11-14 DIAGNOSIS — Z8551 Personal history of malignant neoplasm of bladder: Secondary | ICD-10-CM | POA: Insufficient documentation

## 2012-11-14 DIAGNOSIS — I69959 Hemiplegia and hemiparesis following unspecified cerebrovascular disease affecting unspecified side: Secondary | ICD-10-CM | POA: Insufficient documentation

## 2012-11-14 DIAGNOSIS — J449 Chronic obstructive pulmonary disease, unspecified: Secondary | ICD-10-CM | POA: Insufficient documentation

## 2012-11-14 DIAGNOSIS — E785 Hyperlipidemia, unspecified: Secondary | ICD-10-CM | POA: Insufficient documentation

## 2012-11-14 DIAGNOSIS — R269 Unspecified abnormalities of gait and mobility: Secondary | ICD-10-CM | POA: Insufficient documentation

## 2012-11-14 DIAGNOSIS — I6350 Cerebral infarction due to unspecified occlusion or stenosis of unspecified cerebral artery: Secondary | ICD-10-CM

## 2012-11-14 NOTE — Progress Notes (Signed)
Subjective:    Patient ID: [name], male    DOB: [birth year], 78 y.o.   MRN: [id number] Primary complaint is urinary urgency. Had a incontinent episode after getting back from the doctor yesterday. Is up all night urinating. Has seen his urologist. Has bladder cancer. No new issues or no new treatments Continues with outpatient speech therapy No falls at home HPI  Pain Inventory Average Pain 1 Pain Right Now 1 My pain is constant and tingling  In the last 24 hours, has pain interfered with the following? General activity 5 Relation with others 5 Enjoyment of life 5 What TIME of day is your pain at its worst? night Sleep (in general) Fair  Pain is worse with: walking and some activites Pain improves with: rest Relief from Meds: 5  Mobility walk without assistance how many minutes can you walk? 20 ability to climb steps?  yes Do you have any goals in this area?  yes  Function not employed: date last employed 1990 I need assistance with the following:  meal prep  Neuro/Psych bladder control problems bowel control problems  Prior Studies Any changes since last visit?  no  Physicians involved in your care Any changes since last visit?  no   Family History  Problem Relation Age of Onset  . Heart disease Mother   . Arthritis Father    History   Social History  . Marital Status: Married    Spouse Name: N/A    Number of Children: N/A  . Years of Education: N/A   Occupational History  . retired - Company secretary - Merchandiser, retail for [employer]    Social History Main Topics  . Smoking status: Never Smoker   . Smokeless tobacco: Never Used  . Alcohol Use: No  . Drug Use: None  . Sexually Active: None   Other Topics Concern  . None   Social History Narrative   Wife is Lenardo Westwood - pt. Of [place]   Past Surgical History  Procedure Date  . Splenectomy   . Bilateral vats ablation   . Facial cancer     facial skin cancer   Past Medical History   Diagnosis Date  . NEOPLASM, MALIGNANT, BLADDER 04/10/2009  . HYPERLIPIDEMIA 04/10/2009  . ANEMIA-NOS 04/10/2009  . 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 ANXIETY 04/10/2009  . HYPERTENSION 04/10/2009  . Atrial fibrillation 04/10/2009  . COPD 04/10/2009  . TRANSIENT ISCHEMIC ATTACK, HX OF 04/10/2009  . Bladder cancer     Dr. Darvin Neighbours  . Lacunar stroke 04/07/2011  . Pleural effusion 04/07/2011  . 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 Stroke    Resp 14  Ht 5\' 9"  (1.753 m)  Wt 127 lb 6.4 oz (57.788 kg)  BMI 18.81 kg/m2    Review of Systems  Gastrointestinal: Positive for constipation.  Musculoskeletal: Positive for myalgias and arthralgias.  All other systems reviewed and are negative.       Objective:   Physical Exam  Constitutional: He is oriented to person, place, and time. He appears well-developed.  Neurological: He is alert and oriented to person, place, and time. He displays tremor. No sensory deficit. He exhibits abnormal muscle tone. Coordination and gait abnormal.  Decreased coordination, mild tremor, 4/5 grip in the left hand Gait is Kyphotic-appearing Hip flexor, knee extensor and ankle dorsiflexor plantar flexor strength is normal  Psychiatric: He has a normal mood and affect.        Assessment & Plan:  1.RightCorona radiata and basal ganglia  infarct causing residual left hand fine motor deficits and increased tone as well as mild tremor.  He continues with outpatient speech therapy for a couple more weeks. He has completed PT and OT. He has gone back to playing golf with his son. Did not use a driver RTC when necessary 2. 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 We discussed treatment options for his urinary urgency including condom catheter placement during nighttime which he does not want to do. We discussed anticholinergic medications and potential side effects He did not start the anticholinergic medication I prescribed. He  states that his bladder is better

## 2012-11-15 ENCOUNTER — Ambulatory Visit: Payer: Medicare Other | Admitting: Speech Pathology

## 2012-11-27 ENCOUNTER — Ambulatory Visit: Payer: Medicare Other | Attending: Physical Medicine & Rehabilitation | Admitting: Speech Pathology

## 2012-11-27 DIAGNOSIS — R269 Unspecified abnormalities of gait and mobility: Secondary | ICD-10-CM | POA: Insufficient documentation

## 2012-11-27 DIAGNOSIS — Z5189 Encounter for other specified aftercare: Secondary | ICD-10-CM | POA: Insufficient documentation

## 2012-11-27 DIAGNOSIS — I69998 Other sequelae following unspecified cerebrovascular disease: Secondary | ICD-10-CM | POA: Insufficient documentation

## 2012-11-27 DIAGNOSIS — I69991 Dysphagia following unspecified cerebrovascular disease: Secondary | ICD-10-CM | POA: Insufficient documentation

## 2012-11-27 DIAGNOSIS — M6281 Muscle weakness (generalized): Secondary | ICD-10-CM | POA: Insufficient documentation

## 2012-11-27 DIAGNOSIS — R1310 Dysphagia, unspecified: Secondary | ICD-10-CM | POA: Insufficient documentation

## 2012-11-29 ENCOUNTER — Ambulatory Visit: Payer: Medicare Other | Admitting: Speech Pathology

## 2012-12-04 ENCOUNTER — Encounter: Payer: Medicare Other | Admitting: Speech Pathology

## 2012-12-06 ENCOUNTER — Encounter: Payer: Medicare Other | Admitting: Speech Pathology

## 2012-12-14 ENCOUNTER — Encounter: Payer: Self-pay | Admitting: Cardiology

## 2012-12-14 ENCOUNTER — Ambulatory Visit (INDEPENDENT_AMBULATORY_CARE_PROVIDER_SITE_OTHER): Payer: Medicare Other | Admitting: Cardiology

## 2012-12-14 ENCOUNTER — Other Ambulatory Visit (INDEPENDENT_AMBULATORY_CARE_PROVIDER_SITE_OTHER): Payer: Medicare Other

## 2012-12-14 VITALS — BP 126/66 | HR 125 | Ht 69.0 in | Wt 128.0 lb

## 2012-12-14 DIAGNOSIS — I4892 Unspecified atrial flutter: Secondary | ICD-10-CM

## 2012-12-14 DIAGNOSIS — Z7901 Long term (current) use of anticoagulants: Secondary | ICD-10-CM

## 2012-12-14 DIAGNOSIS — I10 Essential (primary) hypertension: Secondary | ICD-10-CM

## 2012-12-14 DIAGNOSIS — I4891 Unspecified atrial fibrillation: Secondary | ICD-10-CM

## 2012-12-14 LAB — BASIC METABOLIC PANEL
BUN: 29 mg/dL — ABNORMAL HIGH (ref 6–23)
CO2: 30 mEq/L (ref 19–32)
Calcium: 9.3 mg/dL (ref 8.4–10.5)
Chloride: 96 mEq/L (ref 96–112)
Creatinine, Ser: 1.1 mg/dL (ref 0.4–1.5)
GFR: 70.88 mL/min (ref >60.00)
Glucose, Bld: 84 mg/dL (ref 70–99)
Potassium: 4.1 mEq/L (ref 3.5–5.1)
Sodium: 136 mEq/L (ref 135–145)

## 2012-12-14 NOTE — Assessment & Plan Note (Signed)
The patient is not having any symptoms of congestive heart failure.  No headaches.  He still has poor balance from his previous stroke.  However he was able to play a round of golf with his buddies about 2 weeks ago.  He did not take off from the tees but he played his fairway shots to the [employer].  He looks forward to doing that again.

## 2012-12-14 NOTE — Assessment & Plan Note (Signed)
He remains on renal dose Xarelto 15 mg daily.  We're checking a basal metabolic panel today

## 2012-12-14 NOTE — Progress Notes (Signed)
[name] Date of Birth:  [birth year] [facility] [address] [place], [place]  [address] [phone]         Fax   [phone]  History of Present Illness: Mr. Limb is seen back today for a followup office visit. Previously followed by Dr. Tonie Griffith and Arnette Felts in [facility]. Has had chronic atrial fib, difficult to control his rate. Previously on coumadin. Now with recent CVA. He had a right small nonhemorrhagic infarct on MRI. He had been on coumadin but INR was 1.6. He is now on Xarelto. His other problems include CRI, HLD, anemia, HTN, COPD, remote ITP with prior spleenectomy and pseudogout. He has had a remote echo back in 2010 showing an EF of 45 to 50%. He also has bladder cancer and is followed by Dr. Earlene Plater in [place].  He comes in today. He is here with his wife. She provides a lot of history. She feels like he is doing well. No chest pain. Not short of breath.  She feels like he is getting stronger. He had had some right sided weakness and dysphagia. Had lots of difficulty with controlling his rate while in rehab at [facility]. Now on low dose CCB and digoxin. He is now in chronic atrial fib. Tolerating Xarelto without issue. He is only on 15 mg due to his kidneys. He is interested in participating in the Orbit II clinical trial.   Current Outpatient Prescriptions  Medication Sig Dispense Refill  . Cholecalciferol (VITAMIN D) 2000 UNITS tablet Take 2,000-3,000 Units by mouth daily as needed. Patient takes 2000 units daily, except for taking 3000 units (1.5 tablets) on "winter days" and days when "not feeling well."      . digoxin (LANOXIN) 0.125 MG tablet TAKE 1 TABLET BY MOUTH EVERY DAY  30 tablet  1  . diltiazem (CARDIZEM CD) 120 MG 24 hr capsule TAKE ONE CAPSULE BY MOUTH AS DIRECTED  30 capsule  3  . Probiotic Product (PROBIOTIC DAILY PO) Take by mouth.      Carlena Hurl 15 MG TABS tablet TAKE 1 TABLET BY MOUTH EVERY DAY  30 tablet  9    Allergies  Allergen  Reactions  . Diazepam     REACTION: agitation  . Ezetimibe-Simvastatin   . Morphine Other (See Comments)    Hallucinations.  . Sulfonamide Derivatives Hives    Patient Active Problem List  Diagnosis  . NEOPLASM, MALIGNANT, BLADDER  . HYPERLIPIDEMIA  . ANEMIA-NOS  . Immune thrombocytopenic purpura  . ANXIETY  . HYPERTENSION  . Atrial fibrillation with RVR  . COPD  . TRANSIENT ISCHEMIC ATTACK, HX OF  . Lacunar stroke  . Pleural effusion  . Post-splenectomy  . Long term current use of anticoagulant  . CVA (cerebral vascular accident)-Acute right small nonhemorrhagic infarct seen on MRI  . Encephalopathy  . Left knee pain  . Prerenal azotemia  . UTI (lower urinary tract infection)  . Leukocytosis  . Pseudogout  . Preventative health care  . Nocturia  . Constipation    History  Smoking status  . Never Smoker   Smokeless tobacco  . Never Used    History  Alcohol Use No    Family History  Problem Relation Age of Onset  . Heart disease Mother   . Arthritis Father     Review of Systems: Constitutional: no fever chills diaphoresis or fatigue or change in weight.  Head and neck: no hearing loss, no epistaxis, no photophobia or visual disturbance.  Respiratory: No cough, shortness of breath or wheezing. Cardiovascular: No chest pain peripheral edema, palpitations. Gastrointestinal: No abdominal distention, no abdominal pain, no change in bowel habits hematochezia or melena. Genitourinary: No dysuria, no frequency, no urgency, no nocturia. Musculoskeletal:No arthralgias, no back pain, no gait disturbance or myalgias. Neurological: No dizziness, no headaches, no numbness, no seizures, no syncope, no weakness, no tremors. Hematologic: No lymphadenopathy, no easy bruising. Psychiatric: No confusion, no hallucinations, no sleep disturbance.    Physical Exam: Filed Vitals:   12/14/12 1016  BP: 126/66  Pulse: 125   the general appearance reveals a well-developed  well-nourished gentleman in no distress.The head and neck exam reveals pupils equal and reactive.  Extraocular movements are full.  There is no scleral icterus.  The mouth and pharynx are normal.  The neck is supple.  The carotids reveal no bruits.  The jugular venous pressure is normal.  The  thyroid is not enlarged.  There is no lymphadenopathy.  The chest is clear to percussion and auscultation.  There are no rales or rhonchi.  Expansion of the chest is symmetrical.  The precordium is quiet.  The first heart sound is normal.  The second heart sound is physiologically split.  There is no murmur gallop rub or click.  There is no abnormal lift or heave.  The abdomen is soft and nontender.  The bowel sounds are normal.  The liver and spleen are not enlarged.  There are no abdominal masses.  There are no abdominal bruits.  Extremities reveal good pedal pulses.  There is no phlebitis or edema.  There is no cyanosis or clubbing.  Strength is normal and symmetrical in all extremities.  There is no lateralizing weakness.  There are no sensory deficits.  The skin is warm and dry.  There is no rash.  EKG today shows atrial fibrillation with rapid ventricular response at 107.  There is right axis deviation.   Assessment / Plan: Continue same medication.  Recheck in 3 months for followup office visit EKG and basal metabolic panel.

## 2012-12-14 NOTE — Assessment & Plan Note (Signed)
The patient remains in atrial fibrillation.  His heart rate on EKG today as 107.  In the hospital we had problems with excessive bradycardia when we tried to up titrate his diltiazem or his Lanoxin.  He has not been having any dizzy spells or syncope.  No further TIA symptoms.

## 2012-12-14 NOTE — Patient Instructions (Addendum)
Will obtain labs today and call you with the results (bmet)  Your physician recommends that you continue on your current medications as directed. Please refer to the Current Medication list given to you today.  Your physician recommends that you schedule a follow-up appointment in: 3 month ov/ekg/bmet 

## 2012-12-15 ENCOUNTER — Telehealth: Payer: Self-pay | Admitting: *Deleted

## 2012-12-15 MED ORDER — RIVAROXABAN 20 MG PO TABS: 20.0000 mg | ORAL_TABLET | Freq: Every day | ORAL | Status: DC

## 2012-12-15 NOTE — Telephone Encounter (Signed)
I spoke with pt wife about lab results & recommendations to increase Xarelto

## 2012-12-15 NOTE — Telephone Encounter (Signed)
Wife is aware of lab results and Dr. Patty Sermons recommendation to increase Xarelto to 20mg  daily. Wife states they just had this filled and would like to finish this before starting increased dose. Spoke with Dr. Patty Sermons and pt can alternate the 15mg  with 20mg  until runs out of 15mg . Needs to be fully covered since pt has had a stroke in the past.  Wife is aware and agrees with plan.  She will pick up new prescription today and start the Xarelto 20mg  Mylo Red RN

## 2012-12-15 NOTE — Progress Notes (Signed)
To provide better protection against subsequent strokes I would suggest that he go ahead and get the 20 mg tablets and then he could alternate 20 mg one day and 15 mg the next until the smaller ones are used up, after which he would take just the 20s

## 2012-12-25 ENCOUNTER — Other Ambulatory Visit: Payer: Self-pay

## 2012-12-25 MED ORDER — DIGOXIN 125 MCG PO TABS: 0.1250 mg | ORAL_TABLET | Freq: Every day | ORAL | Status: DC

## 2013-01-09 ENCOUNTER — Other Ambulatory Visit: Payer: Self-pay | Admitting: Dermatology

## 2013-01-30 ENCOUNTER — Other Ambulatory Visit: Payer: Self-pay

## 2013-01-30 MED ORDER — DILTIAZEM HCL ER COATED BEADS 120 MG PO CP24: 120.0000 mg | ORAL_CAPSULE | Freq: Every day | ORAL | Status: DC

## 2013-02-26 ENCOUNTER — Telehealth: Payer: Self-pay | Admitting: *Deleted

## 2013-02-26 MED ORDER — DIGOXIN 125 MCG PO TABS: 0.1250 mg | ORAL_TABLET | Freq: Every day | ORAL | Status: DC

## 2013-02-26 NOTE — Telephone Encounter (Signed)
Refilled med

## 2013-03-05 ENCOUNTER — Telehealth: Payer: Self-pay | Admitting: Cardiology

## 2013-03-05 NOTE — Telephone Encounter (Signed)
Patient has had blood in his urine for two days and burning when urinate. Discussed with  Dr. Patty Sermons and patient will hold Xarelto today and increase water intake. Advised wife to also have him follow up with his urologist. Wife to call back tomorrow with update

## 2013-03-05 NOTE — Telephone Encounter (Signed)
New Problem:    Patient's wife called in because her husband has blood in his urine, is on Rivaroxaban (XARELTO) 20 MG TABS, and would like to know if he would be able to be seen today.  Please call back.

## 2013-03-06 ENCOUNTER — Encounter: Payer: Self-pay | Admitting: Internal Medicine

## 2013-03-06 ENCOUNTER — Other Ambulatory Visit (INDEPENDENT_AMBULATORY_CARE_PROVIDER_SITE_OTHER): Payer: Medicare Other

## 2013-03-06 ENCOUNTER — Telehealth: Payer: Self-pay | Admitting: Internal Medicine

## 2013-03-06 ENCOUNTER — Ambulatory Visit (INDEPENDENT_AMBULATORY_CARE_PROVIDER_SITE_OTHER): Payer: Medicare Other | Admitting: Internal Medicine

## 2013-03-06 VITALS — BP 100/70 | HR 124 | Temp 97.2°F | Ht 69.0 in | Wt 133.0 lb

## 2013-03-06 DIAGNOSIS — R319 Hematuria, unspecified: Secondary | ICD-10-CM

## 2013-03-06 DIAGNOSIS — R300 Dysuria: Secondary | ICD-10-CM

## 2013-03-06 LAB — POCT URINALYSIS DIPSTICK
Blood, UA: POSITIVE
Ketones, UA: NEGATIVE
Protein, UA: POSITIVE
Spec Grav, UA: 1.015
pH, UA: 6.5

## 2013-03-06 LAB — APTT: aPTT: 30.9 s — ABNORMAL HIGH (ref 21.7–28.8)

## 2013-03-06 LAB — URINALYSIS, ROUTINE W REFLEX MICROSCOPIC
Bilirubin Urine: NEGATIVE
Ketones, ur: NEGATIVE
Urine Glucose: NEGATIVE
pH: 6 (ref 5.0–8.0)

## 2013-03-06 LAB — PROTIME-INR
INR: 1.3 ratio — ABNORMAL HIGH (ref 0.8–1.0)
Prothrombin Time: 14 s — ABNORMAL HIGH (ref 10.2–12.4)

## 2013-03-06 MED ORDER — CIPROFLOXACIN HCL 500 MG PO TABS: 500.0000 mg | ORAL_TABLET | Freq: Two times a day (BID) | ORAL | Status: DC

## 2013-03-06 NOTE — Telephone Encounter (Signed)
Called to follow up with patient. Left message to call back

## 2013-03-06 NOTE — Telephone Encounter (Signed)
Patient Information:  Caller Name: Delice Bison  Phone: [phone]  Patient: [name], [name]  Gender: Male  DOB: 29-Aug-1927  Age: 79 Years  PCP: Oliver Barre (Adults only)  Office Follow Up:  Does the office need to follow up with this patient?: No  Instructions For The Office: N/A  RN Note:  Patient has active bladder cancer.  Spouse contacted Urologist and was informed to follow up with Primary Care Provider to make sure that he does not have a Urinary tract infection due to burning, urgency, frequency symptoms.  Urologist is not able to get patient in until Thursday or later.  No appointments available with Dr. [name].  Scheduled with Nicki Reaper 03/06/13 at 1530 due to 45-50 minute travel time to office.  Symptoms  Reason For Call & Symptoms: Urinary Bleeding, Burning with urination.  Reviewed Health History In EMR: Yes  Reviewed Medications In EMR: Yes  Reviewed Allergies In EMR: Yes  Reviewed Surgeries / Procedures: Yes  Date of Onset of Symptoms: 03/03/2013  Treatments Tried: Increased fluid intake  Treatments Tried Worked: No  Guideline(s) Used:  Urination Pain - Male  Disposition Per Guideline:   See Today in Office  Reason For Disposition Reached:   All other males with painful urination, or patient wants to be seen  Advice Given:  N/A  Appointment Scheduled:  03/06/2013 15:30:00 Appointment Scheduled Provider:  Nicki Reaper

## 2013-03-07 ENCOUNTER — Telehealth: Payer: Self-pay | Admitting: Cardiology

## 2013-03-07 NOTE — Progress Notes (Signed)
HPI  Pt presents to the clinic today with c/o blood in his urine accompanied by a burning sensation when he pees. He is on xarelto for afib and a recent stroke. He called his cardiologist, who told him to stop his xarelto and go see his urologist who is currently treating him for blader cancer. He was unable to see his urologist today, so he presented to this clinic. He noticed the blood and burning 2 days. He has not taken any OTC for this. He did hold his xarelto but hasn't noticed any change in the color of his urine. He has not had a UTI before that he is aware of. He denies fever, chills, nausea, vomiting or back pain. He denies bleeding from anywhere else.   Review of Systems  Past Medical History  Diagnosis Date  . NEOPLASM, MALIGNANT, BLADDER 04/10/2009  . HYPERLIPIDEMIA 04/10/2009  . ANEMIA-NOS 04/10/2009  . 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 ANXIETY 04/10/2009  . HYPERTENSION 04/10/2009  . Atrial fibrillation 04/10/2009  . COPD 04/10/2009  . TRANSIENT ISCHEMIC ATTACK, HX OF 04/10/2009  . Bladder cancer     Dr. Darvin Neighbours  . Lacunar stroke 04/07/2011  . Pleural effusion 04/07/2011  . 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 Stroke     Family History  Problem Relation Age of Onset  . Heart disease Mother   . Arthritis Father     History   Social History  . Marital Status: Married    Spouse Name: N/A    Number of Children: N/A  . Years of Education: N/A   Occupational History  . retired - Company secretary - Merchandiser, retail for [employer]    Social History Main Topics  . Smoking status: Never Smoker   . Smokeless tobacco: Never Used  . Alcohol Use: No  . Drug Use: Not on file  . Sexually Active: Not on file   Other Topics Concern  . Not on file   Social History Narrative   Wife is Sully Dyment - pt. Of [place]    Allergies  Allergen Reactions  . Diazepam     REACTION: agitation  . Ezetimibe-Simvastatin   . Morphine Other (See Comments)    Hallucinations.  .  Sulfonamide Derivatives Hives    Constitutional: Denies fever, malaise, fatigue, headache or abrupt weight changes.   GU: Pt reports hematuria with burning sensation. Denies urgency, frequency,  odor or discharge. Skin: Denies redness, rashes, lesions or ulcercations.   No other specific complaints in a complete review of systems (except as listed in HPI above).    Objective:   Physical Exam  BP 100/70  Pulse 124  Temp(Src) 97.2 F (36.2 C) (Oral)  Ht 5\' 9"  (1.753 m)  Wt 133 lb (60.328 kg)  BMI 19.63 kg/m2  SpO2 97% Wt Readings from Last 3 Encounters:  03/06/13 133 lb (60.328 kg)  12/14/12 128 lb (58.06 kg)  11/14/12 127 lb 6.4 oz (57.788 kg)    General: Appears his stated age, well developed, well nourished in NAD. Cardiovascular: Normal rate and rhythm. S1,S2 noted.  No murmur, rubs or gallops noted. No JVD or BLE edema. No carotid bruits noted. Pulmonary/Chest: Normal effort and positive vesicular breath sounds. No respiratory distress. No wheezes, rales or ronchi noted.  Abdomen: Soft and nontender. Normal bowel sounds, no bruits noted. No distention or masses noted. Liver, spleen and kidneys non palpable. Tender to palpation over the bladder area. No CVA tenderness.      Assessment & Plan:  Hematuria with Dysuria, likely UTI, new onset:  Drink lots of fluids POCT urinalysis- + blood, protein, leuks Will send to lab for urinalysis and urine culture eRx for Cipro 500 mg BID x 7 days Go ahead and restart your xarelto tonight  RTC as needed or if symptoms persist. If you notice and increase in blood in your urine with restarting the xarelto, please call back here or to your urologist.

## 2013-03-07 NOTE — Telephone Encounter (Signed)
Attempted to call pt phone rings several times with no answer.

## 2013-03-07 NOTE — Patient Instructions (Signed)
Hematuria, Adult  Hematuria (blood in your urine) can be caused by a bladder infection (cystitis), kidney infection (pyelonephritis), prostate infection (prostatitis), or kidney stone. Infections will usually respond to antibiotics (medications which kill germs), and a kidney stone will usually pass through your urine without further treatment. If you were put on antibiotics, take all the medicine until gone. You may feel better in a few days, but take all of your medicine or the infection may not respond and become more difficult to treat. If antibiotics were not given, an infection did not cause the blood in the urine. A further work up to find out the reason may be needed.  HOME CARE INSTRUCTIONS    Drink lots of fluid, 3 to 4 quarts a day. If you have been diagnosed with an infection, cranberry juice is especially recommended, in addition to large amounts of water.   Avoid caffeine, tea, and carbonated beverages, because they tend to irritate the bladder.   Avoid alcohol as it may irritate the prostate.   Only take over-the-counter or prescription medicines for pain, discomfort, or fever as directed by your caregiver.   If you have been diagnosed with a kidney stone follow your caregivers instructions regarding straining your urine to catch the stone.  TO PREVENT FURTHER INFECTIONS:   Empty the bladder often. Avoid holding urine for long periods of time.   After a bowel movement, women should cleanse front to back. Use each tissue only once.   Empty the bladder before and after sexual intercourse if you are a male.   Return to your caregiver if you develop back pain, fever, nausea (feeling sick to your stomach), vomiting, or your symptoms (problems) are not better in 3 days. Return sooner if you are getting worse.  If you have been requested to return for further testing make sure to keep your appointments. If an infection is not the cause of blood in your urine, X-rays may be required. Your caregiver  will discuss this with you.  SEEK IMMEDIATE MEDICAL CARE IF:    You have a persistent fever over 102 F (38.9 C).   You develop severe vomiting and are unable to keep the medication down.   You develop severe back or abdominal pain despite taking your medications.   You begin passing a large amount of blood or clots in your urine.   You feel extremely weak or faint, or pass out.  MAKE SURE YOU:    Understand these instructions.   Will watch your condition.   Will get help right away if you are not doing well or get worse.  Document Released: 12/13/2005 Document Revised: 03/06/2012 Document Reviewed: 08/01/2008  ExitCare Patient Information 2013 [employer].

## 2013-03-07 NOTE — Telephone Encounter (Signed)
Pt's wife calling re stopping xarelto due to blood in urine, had UA checked and has infection, started back on xarelto last night and bleeding came back as heavy as before starting abx cipro, pls advise [phone]

## 2013-03-07 NOTE — Telephone Encounter (Signed)
Tempted to call pt x 2, phone sound busy.

## 2013-03-08 NOTE — Telephone Encounter (Signed)
Will forward to melinda and dr Patty Sermons

## 2013-03-08 NOTE — Telephone Encounter (Signed)
Spoke with wife and she states he is doing much better. Will forward to  Dr. Patty Sermons

## 2013-03-08 NOTE — Telephone Encounter (Signed)
Pt's wife said she was told to call [name] back today, can be reached @ [phone], pls  Call before 12p has funeral for her sister, did see pcp , talked to urologist, suggested he go to  [facility] , saw him 3-11, was put on cipro , ua was done, bleeding better, some pressure still, no need to call unless you need to talk with her, also pt back xeralto as of last night

## 2013-03-08 NOTE — Telephone Encounter (Signed)
Burnell Blanks at 03/08/2013 10:40 AM   Status: Signed            Spoke with wife and she states he is doing much better. Will forward to Dr. Brynda Peon, RN at 03/08/2013 10:28 AM    Status: Signed             Will forward to melinda and dr Patty Sermons        Danton Clap at 03/08/2013 9:49 AM    Status: Signed             Pt's wife said she was told to call Juliette Alcide back today, can be reached @ [phone], pls Call before 12p has funeral for her sister, did see pcp , talked to urologist, suggested he go to [facility] , saw him 3-11, was put on cipro , ua was done, bleeding better, some pressure still, no need to call unless you need to talk with her, also pt back xeralto as of last night        Harriet Butte, RN at 03/07/2013 4:43 PM    Status: Signed             Tempted to call pt x 2, phone sound busy.        Harriet Butte, RN at 03/07/2013 11:02 AM    Status: Signed             Attempted to call pt phone rings several times with no answer.        Melissa A Tatum at 03/07/2013 10:56 AM    Status: Signed             Pt's wife calling re stopping xarelto due to blood in urine, had UA checked and has infection, started back on xarelto last night and bleeding came back as heavy as before starting abx cipro, pls advise [phone]

## 2013-03-08 NOTE — Telephone Encounter (Signed)
Good that he is doing better.  Continue current medicine

## 2013-03-12 ENCOUNTER — Telehealth: Payer: Self-pay | Admitting: Cardiology

## 2013-03-12 NOTE — Telephone Encounter (Signed)
Wife states he has had blood in urine off and on since last week. Today is last day of Cipro.  Wife has also called [name]. Will forward to  Dr. Patty Sermons for review

## 2013-03-12 NOTE — Telephone Encounter (Signed)
Spoke with wife and they never heard back from [name], will call again first thing in the morning. Wife to call back tomorrow after talking with there office

## 2013-03-12 NOTE — Telephone Encounter (Signed)
Would like to have him continue his blood thinner since he has had a previous stroke.  Await results of advice from his urologist concerning his hematuria.

## 2013-03-12 NOTE — Telephone Encounter (Signed)
New Problem:    Patient's wife called in wanting to speak with you because her husband still has blood in his urine.  Please call back.

## 2013-03-13 ENCOUNTER — Ambulatory Visit: Payer: Medicare Other | Admitting: Internal Medicine

## 2013-03-13 ENCOUNTER — Telehealth: Payer: Self-pay | Admitting: Cardiology

## 2013-03-13 NOTE — Telephone Encounter (Signed)
Spoke with wife and advised maybe she needed to just try to get her husband in with [name]. Stated she may try to get an appointment or will continue calling daily.

## 2013-03-13 NOTE — Telephone Encounter (Signed)
[name] at 03/13/2013 12:30 PM   Status: Signed            PT'S WIFE TO CALL [name] RE PT HAS HAD VERY LITTLE BLEEDING TODAY, COMPLETED ABX TODAY, DR RON DAVIS DID NOT RTN THEIR CALL, HE IS IN SURGERY TODAY, AND THANK YOU!        Spoke with wife and advised maybe she needed to just try to get her husband in with [name]. Stated she may try to get an appointment or will continue calling daily.

## 2013-03-13 NOTE — Telephone Encounter (Signed)
PT'S WIFE TO CALL MELINDA RE PT HAS HAD VERY LITTLE BLEEDING TODAY, COMPLETED ABX TODAY, DR RON DAVIS DID NOT RTN THEIR CALL, HE IS IN SURGERY TODAY, AND THANK YOU!

## 2013-03-15 ENCOUNTER — Telehealth: Payer: Self-pay | Admitting: Cardiology

## 2013-03-15 NOTE — Telephone Encounter (Signed)
Patient wife called stating patient had small bowel movement yesterday but non today. Patient just started complaining of abdominal pain. Denied black/dark stool yesterday. Did have blood in urine last night but none today per wife. They never heard back from [name], even called again yesterday. Wife states even if they called Dr Melvyn Novas office they would not be able to get him there today secondary to it being at least a 45 minute drive. Given patients history and multiple issues advised to take him to urgent care/[facility]. Wife verbalized understanding.

## 2013-03-15 NOTE — Telephone Encounter (Signed)
New problem    Pt's spouse stated is having a lot of stomach pain since this afternoon. She need someone to call her about this issue.

## 2013-03-16 ENCOUNTER — Encounter: Payer: Self-pay | Admitting: Internal Medicine

## 2013-03-16 ENCOUNTER — Ambulatory Visit (INDEPENDENT_AMBULATORY_CARE_PROVIDER_SITE_OTHER): Payer: Medicare Other | Admitting: Internal Medicine

## 2013-03-16 VITALS — BP 122/78 | HR 109 | Temp 96.7°F | Ht 69.0 in | Wt 132.0 lb

## 2013-03-16 DIAGNOSIS — R109 Unspecified abdominal pain: Secondary | ICD-10-CM

## 2013-03-16 DIAGNOSIS — K5900 Constipation, unspecified: Secondary | ICD-10-CM

## 2013-03-16 DIAGNOSIS — R319 Hematuria, unspecified: Secondary | ICD-10-CM

## 2013-03-16 NOTE — Patient Instructions (Signed)
Abdominal Pain  Abdominal pain can be caused by many things. Your caregiver decides the seriousness of your pain by an examination and possibly blood tests and X-rays. Many cases can be observed and treated at home. Most abdominal pain is not caused by a disease and will probably improve without treatment. However, in many cases, more time must pass before a clear cause of the pain can be found. Before that point, it may not be known if you need more testing, or if hospitalization or surgery is needed.  HOME CARE INSTRUCTIONS   · Do not take laxatives unless directed by your caregiver.  · Take pain medicine only as directed by your caregiver.  · Only take over-the-counter or prescription medicines for pain, discomfort, or fever as directed by your caregiver.  · Try a clear liquid diet (broth, tea, or water) for as long as directed by your caregiver. Slowly move to a bland diet as tolerated.  SEEK IMMEDIATE MEDICAL CARE IF:   · The pain does not go away.  · You have a fever.  · You keep throwing up (vomiting).  · The pain is felt only in portions of the abdomen. Pain in the right side could possibly be appendicitis. In an adult, pain in the left lower portion of the abdomen could be colitis or diverticulitis.  · You pass bloody or black tarry stools.  MAKE SURE YOU:   · Understand these instructions.  · Will watch your condition.  · Will get help right away if you are not doing well or get worse.  Document Released: 09/22/2005 Document Revised: 03/06/2012 Document Reviewed: 07/31/2008  ExitCare® Patient Information ©2013 [employer].

## 2013-03-16 NOTE — Progress Notes (Signed)
Subjective:    Patient ID: [name], male    DOB: [birth year], 79 y.o.   MRN: [id number]  HPI  Pt presents to the clinic today with c/o abdominal pain.  This started 1 days ago. He only had one episode o fabdominal pain which he rated as severe. He tried to have a BM but was unsuccessful. His last BM was 3 days ago. He has not taken any suppositories or enemas. He has had a problem with constipation since his stroke. He was also seen 10 days ago for hematuria. Urinalysis showed leuks, blood an mucous. Culture was negative for growth. He did complete a course of cipro. The hematuria continues to come and go. He has been trying toget in sooner with his urologist Dr. Earlene [name]  But has been unsuccessful.  Review of Systems      Past Medical History  Diagnosis Date  . NEOPLASM, MALIGNANT, BLADDER 04/10/2009  . HYPERLIPIDEMIA 04/10/2009  . ANEMIA-NOS 04/10/2009  . 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 ANXIETY 04/10/2009  . HYPERTENSION 04/10/2009  . Atrial fibrillation 04/10/2009  . COPD 04/10/2009  . TRANSIENT ISCHEMIC ATTACK, HX OF 04/10/2009  . Bladder cancer     Dr. Darvin [name]  . Lacunar stroke 04/07/2011  . Pleural effusion 04/07/2011  . 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 Stroke     Current Outpatient Prescriptions  Medication Sig Dispense Refill  . Cholecalciferol (VITAMIN D) 2000 UNITS tablet Take 2,000-3,000 Units by mouth daily as needed. Patient takes 2000 units daily, except for taking 3000 units (1.5 tablets) on "winter days" and days when "not feeling well."      . ciprofloxacin (CIPRO) 500 MG tablet Take 1 tablet (500 mg total) by mouth 2 (two) times daily.  14 tablet  0  . digoxin (LANOXIN) 0.125 MG tablet Take 1 tablet (0.125 mg total) by mouth daily.  30 tablet  1  . diltiazem (CARDIZEM CD) 120 MG 24 hr capsule Take 1 capsule (120 mg total) by mouth daily.  30 capsule  3  . Probiotic Product (PROBIOTIC DAILY PO) Take by mouth.      . Rivaroxaban (XARELTO) 20 MG TABS Take 1  tablet (20 mg total) by mouth daily.  30 tablet  12   No current facility-administered medications for this visit.    Allergies  Allergen Reactions  . Diazepam     REACTION: agitation  . Ezetimibe-Simvastatin   . Morphine Other (See Comments)    Hallucinations.  . Sulfonamide Derivatives Hives    Family History  Problem Relation Age of Onset  . Heart disease Mother   . Arthritis Father     History   Social History  . Marital Status: Married    Spouse Name: N/A    Number of Children: N/A  . Years of Education: N/A   Occupational History  . retired - Company secretary - Merchandiser, retail for [employer]    Social History Main Topics  . Smoking status: Never Smoker   . Smokeless tobacco: Never Used  . Alcohol Use: No  . Drug Use: Not on file  . Sexually Active: Not on file   Other Topics Concern  . Not on file   Social History Narrative   Wife is [name] - pt. Of [place]     Constitutional: Denies fever, malaise, fatigue, headache or abrupt weight changes.  Respiratory: Denies difficulty breathing, shortness of breath, cough or sputum production.   Cardiovascular: Denies chest pain, chest tightness, palpitations or swelling in the  hands or feet.  Gastrointestinal: Pt reports abdominal pain and constipation. Denies bloating, diarrhea or blood in the stool.  GU: Pt reports hematuria. Denies urgency, frequency, pain with urination, burning sensation, odor or discharge.    No other specific complaints in a complete review of systems (except as listed in HPI above).  Objective:   Physical Exam  BP 122/78  Pulse 109  Temp(Src) 96.7 F (35.9 C) (Oral)  Ht 5\' 9"  (1.753 m)  Wt 132 lb (59.875 kg)  BMI 19.48 kg/m2  SpO2 98% Wt Readings from Last 3 Encounters:  03/16/13 132 lb (59.875 kg)  03/06/13 133 lb (60.328 kg)  12/14/12 128 lb (58.06 kg)    General: Appears her stated age, well developed, well nourished in NAD. Cardiovascular: Normal rate and  rhythm. S1,S2 noted.  No murmur, rubs or gallops noted. No JVD or BLE edema. No carotid bruits noted. Pulmonary/Chest: Normal effort and positive vesicular breath sounds. No respiratory distress. No wheezes, rales or ronchi noted.  Abdomen: Soft and nontender. Hyperactive bowel sounds, no bruits noted. No distention or masses noted. Liver, spleen and kidneys non palpable.        Assessment & Plan:   Abdominal pain secondary to constipation, chronic:  Advised pt to get OTC fleets enema to see if that gives him some relief Start Mirilax 17 gm daily, and drink with lots of water  If your abdominal pain persists or get worst, call me and I will place an order for a CT abdomen  Hematuria:  Will try to get in touch with Dr. Earlene [name] to see if pt can be scoped before May 1st secondary to symptoms

## 2013-03-23 ENCOUNTER — Encounter: Payer: Self-pay | Admitting: Cardiology

## 2013-03-23 ENCOUNTER — Other Ambulatory Visit (INDEPENDENT_AMBULATORY_CARE_PROVIDER_SITE_OTHER): Payer: Medicare Other

## 2013-03-23 ENCOUNTER — Ambulatory Visit (INDEPENDENT_AMBULATORY_CARE_PROVIDER_SITE_OTHER): Payer: Medicare Other | Admitting: Cardiology

## 2013-03-23 VITALS — BP 126/72 | HR 94 | Ht 69.0 in | Wt 132.0 lb

## 2013-03-23 DIAGNOSIS — I10 Essential (primary) hypertension: Secondary | ICD-10-CM

## 2013-03-23 DIAGNOSIS — I4891 Unspecified atrial fibrillation: Secondary | ICD-10-CM

## 2013-03-23 DIAGNOSIS — T68XXXA Hypothermia, initial encounter: Secondary | ICD-10-CM | POA: Insufficient documentation

## 2013-03-23 DIAGNOSIS — R0989 Other specified symptoms and signs involving the circulatory and respiratory systems: Secondary | ICD-10-CM

## 2013-03-23 LAB — BASIC METABOLIC PANEL
CO2: 30 mEq/L (ref 19–32)
Chloride: 95 mEq/L — ABNORMAL LOW (ref 96–112)
Creatinine, Ser: 1 mg/dL (ref 0.4–1.5)
Potassium: 4.5 mEq/L (ref 3.5–5.1)

## 2013-03-23 LAB — TSH: TSH: 2.07 u[IU]/mL (ref 0.35–5.50)

## 2013-03-23 NOTE — Assessment & Plan Note (Signed)
Blood pressure is remaining stable on current therapy.  No headaches or dizziness.

## 2013-03-23 NOTE — Progress Notes (Signed)
[name] Date of Birth:  12-06-1927 [name] [address] [name], [place]  [address] [phone]         Fax   [phone]  History of Present Illness: Mr. Schoenberger is seen back today for a followup office visit. Previously followed by Dr. Tonie Griffith and Arnette Felts in [facility]. Has had chronic atrial fib, difficult to control his rate. Previously on coumadin. Now with recent CVA. He had a right small nonhemorrhagic infarct on MRI. He had been on coumadin but INR was 1.6. He is now on Xarelto. His other problems include CRI, HLD, anemia, HTN, COPD, remote ITP with prior spleenectomy and pseudogout. He has had a remote echo back in 2010 showing an EF of 45 to 50%. He also has bladder cancer and is followed by Dr. Earlene Plater in [place].  He comes in today. He is here with his wife. She provides a lot of history. She feels like he is doing well. No chest pain. Not short of breath. She feels like he is getting stronger. He had had some right sided weakness and dysphagia. Had lots of difficulty with controlling his rate while in rehab at [facility]. Now on low dose CCB and digoxin. He is now in chronic atrial fib. Tolerating Xarelto without issue. He is only on 15 mg due to his kidneys. He is interested in participating in the Orbit II clinical trial.  Since last visit he had another urinary tract infection and responded to a 7 day course of Cipro.   Current Outpatient Prescriptions  Medication Sig Dispense Refill  . Cholecalciferol (VITAMIN D) 2000 UNITS tablet Take 2,000-3,000 Units by mouth daily as needed. Patient takes 2000 units daily, except for taking 3000 units (1.5 tablets) on "winter days" and days when "not feeling well."      . digoxin (LANOXIN) 0.125 MG tablet Take 1 tablet (0.125 mg total) by mouth daily.  30 tablet  1  . diltiazem (CARDIZEM CD) 120 MG 24 hr capsule Take 1 capsule (120 mg total) by mouth daily.  30 capsule  3  . Probiotic Product (PROBIOTIC DAILY PO)  Take by mouth.      . Rivaroxaban (XARELTO) 20 MG TABS Take 1 tablet (20 mg total) by mouth daily.  30 tablet  12   No current facility-administered medications for this visit.    Allergies  Allergen Reactions  . Diazepam     REACTION: agitation  . Ezetimibe-Simvastatin   . Morphine Other (See Comments)    Hallucinations.  . Sulfonamide Derivatives Hives    Patient Active Problem List  Diagnosis  . NEOPLASM, MALIGNANT, BLADDER  . HYPERLIPIDEMIA  . ANEMIA-NOS  . Immune thrombocytopenic purpura  . ANXIETY  . HYPERTENSION  . Atrial fibrillation with RVR  . COPD  . TRANSIENT ISCHEMIC ATTACK, HX OF  . Lacunar stroke  . Pleural effusion  . Post-splenectomy  . Long term current use of anticoagulant  . CVA (cerebral vascular accident)-Acute right small nonhemorrhagic infarct seen on MRI  . Encephalopathy  . Left knee pain  . Prerenal azotemia  . UTI (lower urinary tract infection)  . Leukocytosis  . Pseudogout  . Preventative health care  . Nocturia  . Constipation    History  Smoking status  . Never Smoker   Smokeless tobacco  . Never Used    History  Alcohol Use No    Family History  Problem Relation Age of Onset  . Heart disease Mother   . Arthritis  Father     Review of Systems: Constitutional: no fever chills diaphoresis or fatigue or change in weight.  Head and neck: no hearing loss, no epistaxis, no photophobia or visual disturbance. Respiratory: No cough, shortness of breath or wheezing. Cardiovascular: No chest pain peripheral edema, palpitations. Gastrointestinal: No abdominal distention, no abdominal pain, no change in bowel habits hematochezia or melena. Genitourinary: No dysuria, no frequency, no urgency, no nocturia. Musculoskeletal:No arthralgias, no back pain, no gait disturbance or myalgias. Neurological: No dizziness, no headaches, no numbness, no seizures, no syncope, no weakness, no tremors. Hematologic: No lymphadenopathy, no easy  bruising. Psychiatric: No confusion, no hallucinations, no sleep disturbance.    Physical Exam: Filed Vitals:   03/23/13 1042  BP: 126/72  Pulse: 94   the general appearance reveals a well-developed elderly gentleman in no distress.The head and neck exam reveals pupils equal and reactive.  Extraocular movements are full.  There is no scleral icterus.  The mouth and pharynx are normal.  The neck is supple.  The carotids reveal no bruits.  The jugular venous pressure is normal.  The  thyroid is not enlarged.  There is no lymphadenopathy.  The chest is clear to percussion and auscultation.  There are no rales or rhonchi.  Expansion of the chest is symmetrical.  The precordium is quiet.  The pulse is irregularly irregular  The first heart sound is normal.  The second heart sound is physiologically split.  There is no  gallop rub or click.  There is a soft systolic ejection murmur across the aortic valve.  There is no diastolic murmur.  There is no abnormal lift or heave.  The abdomen is soft and nontender.  The bowel sounds are normal.  The liver and spleen are not enlarged.  There are no abdominal masses.  There are no abdominal bruits.  Extremities reveal good pedal pulses.  There is no phlebitis or edema.  There is no cyanosis or clubbing.  Strength is normal and symmetrical in all extremities.  There is no lateralizing weakness.  There are no sensory deficits.  The skin is warm and dry.  There is no rash.    EKG today shows atrial fibrillation and no ischemic changes.  The heart rate is 94 per minute  Assessment / Plan:  Continue on same medication.  We will check a TSH today along with other labs. His aortic stenosis appears to be still very mild.  His last echocardiogram was in 2010 and we will consider updating after his next office visit. Recheck in 3 months followup office visit

## 2013-03-23 NOTE — Patient Instructions (Addendum)
Will obtain labs today and call you with the results (tsh/bmet)  Your physician recommends that you continue on your current medications as directed. Please refer to the Current Medication list given to you today.  Your physician recommends that you schedule a follow-up appointment in: 3 month ov

## 2013-03-23 NOTE — Assessment & Plan Note (Signed)
The patient has not been having any symptoms from his atrial fibrillation.  He has had no subsequent strokes.  Is not having any symptoms of congestive heart failure.  He is not having any problems from the Xarelto anticoagulation.  He is on 20 mg daily and we are checking a basal metabolic panel today.

## 2013-03-23 NOTE — Assessment & Plan Note (Signed)
The patient is pleasant by the fact that his body temperature tends to stay around 96.  He used to stay around 97.  He has not had a TSH level in several years and we will check that today although clinically he is not hypothyroid.

## 2013-03-24 NOTE — Progress Notes (Signed)
Quick Note:  Please report to patient. The recent labs are stable. Continue same medication and careful diet. The kidney function is normal so he is on the proper dose of Xarelto 20 mg daily. ______

## 2013-03-28 ENCOUNTER — Telehealth: Payer: Self-pay | Admitting: *Deleted

## 2013-03-28 NOTE — Telephone Encounter (Signed)
Message copied by Burnell Blanks on Wed Mar 28, 2013  5:36 PM ------      Message from: Cassell Clement      Created: Sat Mar 24, 2013  4:45 PM       Please report to patient.  The recent labs are stable. Continue same medication and careful diet.  The kidney function is normal so he is on the proper dose of Xarelto 20 mg daily. ------

## 2013-03-28 NOTE — Telephone Encounter (Signed)
Advised wife of labs 

## 2013-04-10 ENCOUNTER — Other Ambulatory Visit: Payer: Self-pay | Admitting: Dermatology

## 2013-04-10 ENCOUNTER — Telehealth: Payer: Self-pay | Admitting: Cardiology

## 2013-04-10 NOTE — Telephone Encounter (Signed)
New Problem:    Patient's wife called in because the patient is having some bleeding again, also had some skin cancer removed form his nose and had an issue stopping the bleeding.  Please call back.

## 2013-04-10 NOTE — Telephone Encounter (Signed)
Patient had place taken off nose today by dermatologist today and they had a very difficult time stopping the bleeding. Patient was concerned that nose was going to start bleeding again taking the Xarelto. Discussed with Dr Elease Hashimoto and strongly encourage wife to have patient continue Xarelto secondary to being high risk for strong. Wife verbalized understanding.

## 2013-04-24 ENCOUNTER — Other Ambulatory Visit: Payer: Self-pay | Admitting: *Deleted

## 2013-04-24 MED ORDER — DIGOXIN 125 MCG PO TABS: 0.1250 mg | ORAL_TABLET | Freq: Every day | ORAL | Status: DC

## 2013-05-07 ENCOUNTER — Telehealth: Payer: Self-pay | Admitting: Cardiology

## 2013-05-07 NOTE — Telephone Encounter (Signed)
New problem    Pt is having leg pain and bleeding. Pt is on antibiotic and want to talk to you concerning this.

## 2013-05-07 NOTE — Telephone Encounter (Signed)
Advised wife no better follow up with PCP per  Dr. Patty Sermons  Patient had no bruising or swelling in the leg

## 2013-05-07 NOTE — Telephone Encounter (Signed)
Follow up  ° ° ° °Returning call back to nurse  °

## 2013-05-07 NOTE — Telephone Encounter (Signed)
Bladder scope last week. Doxycycline given last week secondary to UTI, having bleeding again. Last night had pain from back of the knee up

## 2013-05-07 NOTE — Telephone Encounter (Signed)
Pain actually not behind knee on inner thigh, would come and go. Per  Dr. Patty Sermons continue to monitor and if no better to go to PCP

## 2013-05-23 ENCOUNTER — Other Ambulatory Visit: Payer: Self-pay | Admitting: *Deleted

## 2013-05-23 MED ORDER — DILTIAZEM HCL ER COATED BEADS 120 MG PO CP24: 120.0000 mg | ORAL_CAPSULE | Freq: Every day | ORAL | Status: DC

## 2013-06-05 ENCOUNTER — Ambulatory Visit (INDEPENDENT_AMBULATORY_CARE_PROVIDER_SITE_OTHER): Payer: Medicare Other | Admitting: Cardiology

## 2013-06-05 ENCOUNTER — Encounter: Payer: Self-pay | Admitting: Cardiology

## 2013-06-05 VITALS — BP 128/62 | HR 84 | Ht 69.0 in | Wt 132.0 lb

## 2013-06-05 DIAGNOSIS — C679 Malignant neoplasm of bladder, unspecified: Secondary | ICD-10-CM

## 2013-06-05 DIAGNOSIS — I4891 Unspecified atrial fibrillation: Secondary | ICD-10-CM

## 2013-06-05 DIAGNOSIS — Z8679 Personal history of other diseases of the circulatory system: Secondary | ICD-10-CM

## 2013-06-05 LAB — BASIC METABOLIC PANEL
Calcium: 9.2 mg/dL (ref 8.4–10.5)
GFR: 70.03 mL/min (ref >60.00)
Glucose, Bld: 69 mg/dL — ABNORMAL LOW (ref 70–99)
Potassium: 4.5 mEq/L (ref 3.5–5.1)
Sodium: 136 mEq/L (ref 135–145)

## 2013-06-05 LAB — CBC WITH DIFFERENTIAL/PLATELET
Basophils Absolute: 0 10*3/uL (ref 0.0–0.1)
Eosinophils Relative: 0.7 % (ref 0.0–5.0)
HCT: 39 % (ref 39.0–52.0)
Lymphs Abs: 1.5 10*3/uL (ref 0.7–4.0)
MCV: 103.5 fl — ABNORMAL HIGH (ref 78.0–100.0)
Monocytes Absolute: 0.8 10*3/uL (ref 0.1–1.0)
Monocytes Relative: 12.1 % — ABNORMAL HIGH (ref 3.0–12.0)
Neutrophils Relative %: 65.7 % (ref 43.0–77.0)
Platelets: 179 10*3/uL (ref 150.0–400.0)
RDW: 14 % (ref 11.5–14.6)
WBC: 6.9 10*3/uL (ref 4.5–10.5)

## 2013-06-05 MED ORDER — RIVAROXABAN 15 MG PO TABS: 15.0000 mg | ORAL_TABLET | Freq: Every day | ORAL | Status: DC

## 2013-06-05 NOTE — Assessment & Plan Note (Signed)
The patient has been invited to participate in a research protocol through Dr. Jinny Sanders urology office.  His wife had me look over the protocol and I think he would be reasonable for him to try to participate in the protocol.

## 2013-06-05 NOTE — Patient Instructions (Addendum)
Will obtain labs today and call you with the results (cbc/bmet)  DECREASE YOUR XARELTO TO 15 MG DAILY  Your physician has requested that you have an echocardiogram. Echocardiography is a painless test that uses sound waves to create images of your heart. It provides your doctor with information about the size and shape of your heart and how well your heart's chambers and valves are working. This procedure takes approximately one hour. There are no restrictions for this procedure.  Your physician recommends that you schedule a follow-up appointment in: 3 month ov/ekg  Ok to take CoQ10

## 2013-06-05 NOTE — Assessment & Plan Note (Signed)
The patient is in permanent atrial fibrillation.  His rate is adequately controlled.  Because of his frequent gross hematuria we are reducing the dose of Xarelto down to 15 mg daily instead of 20.  His creatinine clearance is normal.  However he is having too much gross hematuria on the full dose Xarelto

## 2013-06-05 NOTE — Progress Notes (Signed)
[name] Date of Birth:  [birth year] [facility] [address] [place], [place]  [address] [phone]         Fax   [phone]  History of Present Illness: [name] is seen back today for a followup office visit. Previously followed by Dr. Tonie Griffith and Arnette Felts in [facility]. Has had chronic atrial fib, difficult to control his rate. Previously on coumadin. Now with recent CVA. He had a right small nonhemorrhagic infarct on MRI. He had been on coumadin but INR was 1.6. He is now on Xarelto. His other problems include CRI, HLD, anemia, HTN, COPD, remote ITP with prior spleenectomy and pseudogout. He has had a remote echo back in 2010 showing an EF of 45 to 50%. He also has bladder cancer and is followed by Dr. Earlene Plater in [place].  Since last visit he has been doing well from the cardiac standpoint.  He was able to play in a benefit golf tournament with his son and the patient scored a hole-in-one. The patient has continued to have intermittent gross hematuria related to his known bladder cancer.  He has had to stop the Xarelto intermittently because of this.   Current Outpatient Prescriptions  Medication Sig Dispense Refill  . Cholecalciferol (VITAMIN D) 2000 UNITS tablet Take 2,000-3,000 Units by mouth daily as needed. Patient takes 2000 units daily, except for taking 3000 units (1.5 tablets) on "winter days" and days when "not feeling well."      . co-enzyme Q-10 30 MG capsule Take 30 mg by mouth daily.      . digoxin (LANOXIN) 0.125 MG tablet Take 1 tablet (0.125 mg total) by mouth daily.  30 tablet  5  . diltiazem (CARDIZEM CD) 120 MG 24 hr capsule Take 1 capsule (120 mg total) by mouth daily.  30 capsule  3  . Probiotic Product (PROBIOTIC DAILY PO) Take by mouth.      . Rivaroxaban (XARELTO) 15 MG TABS tablet Take 1 tablet (15 mg total) by mouth daily.  30 tablet  12   No current facility-administered medications for this visit.    Allergies  Allergen  Reactions  . Diazepam     REACTION: agitation  . Ezetimibe-Simvastatin   . Morphine Other (See Comments)    Hallucinations.  . Sulfonamide Derivatives Hives    Patient Active Problem List   Diagnosis Date Noted  . Hypothermia 03/23/2013  . Constipation 10/16/2012  . Preventative health care 09/17/2012  . Nocturia 09/17/2012  . UTI (lower urinary tract infection) 08/22/2012  . Leukocytosis 08/22/2012  . Pseudogout 08/22/2012  . Encephalopathy 08/21/2012  . Left knee pain 08/21/2012  . Prerenal azotemia 08/21/2012  . CVA (cerebral vascular accident)-Acute right small nonhemorrhagic infarct seen on MRI 08/17/2012  . Lacunar stroke 04/07/2011  . Pleural effusion 04/07/2011  . 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 Long term current use of anticoagulant 04/07/2011  . NEOPLASM, MALIGNANT, BLADDER 04/10/2009  . HYPERLIPIDEMIA 04/10/2009  . ANEMIA-NOS 04/10/2009  . 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 ANXIETY 04/10/2009  . HYPERTENSION 04/10/2009  . Atrial fibrillation with RVR 04/10/2009  . COPD 04/10/2009  . TRANSIENT ISCHEMIC ATTACK, HX OF 04/10/2009    History  Smoking status  . Never Smoker   Smokeless tobacco  . Never Used    History  Alcohol Use No    Family History  Problem Relation Age of Onset  . Heart disease Mother   . Arthritis Father     Review of Systems: Constitutional: no  fever chills diaphoresis or fatigue or change in weight.  Head and neck: no hearing loss, no epistaxis, no photophobia or visual disturbance. Respiratory: No cough, shortness of breath or wheezing. Cardiovascular: No chest pain peripheral edema, palpitations. Gastrointestinal: No abdominal distention, no abdominal pain, no change in bowel habits hematochezia or melena. Genitourinary: No dysuria, no frequency, no urgency, no nocturia. Musculoskeletal:No arthralgias, no back pain, no gait disturbance or myalgias. Neurological: No dizziness, no headaches, no numbness, no seizures,  no syncope, no weakness, no tremors. Hematologic: No lymphadenopathy, no easy bruising. Psychiatric: No confusion, no hallucinations, no sleep disturbance.    Physical Exam: Filed Vitals:   06/05/13 1030  BP: 128/62  Pulse: 84   the general appearance reveals a pleasant elderly gentleman in no distress.The head and neck exam reveals pupils equal and reactive.  Extraocular movements are full.  There is no scleral icterus.  The mouth and pharynx are normal.  The neck is supple.  The carotids reveal no bruits.  The jugular venous pressure is normal.  The  thyroid is not enlarged.  There is no lymphadenopathy.  The chest is clear to percussion and auscultation.  There are no rales or rhonchi.  Expansion of the chest is symmetrical.  The precordium is quiet.  The pulse is irregularly irregular. The first heart sound is normal.  The second heart sound is physiologically split.  There is no murmur gallop rub or click.  There is no abnormal lift or heave.  The abdomen is soft and nontender.  The bowel sounds are normal.  The liver and spleen are not enlarged.  There are no abdominal masses.  There are no abdominal bruits.  Extremities reveal good pedal pulses.  There is no phlebitis or edema.  There is no cyanosis or clubbing.  Strength is normal and symmetrical in all extremities.  There is no lateralizing weakness.  There are no sensory deficits.  The skin is warm and dry.  There is no rash.     Assessment / Plan: Continue same medication except reduce the Xarelto.  Okay for him to take coenzyme Q10 at his request.  We will get an update on his echocardiogram.  In view of his recent gross hematuria we are checking a CBC and a basal metabolic panel today. Recheck in 3 months for followup office visit and EKG.

## 2013-06-05 NOTE — Assessment & Plan Note (Signed)
The patient has had occasional episodes of benign positional vertigo.  He has not had any recurrent TIA or stroke symptoms.

## 2013-06-06 NOTE — Progress Notes (Signed)
Quick Note:  Please report to patient. The recent labs are stable. Continue same medication and careful diet. ______ 

## 2013-06-06 NOTE — Progress Notes (Signed)
Quick Note:  Preliminary report reviewed by triage nurse and sent to MD desk./PE   ______ 

## 2013-06-07 ENCOUNTER — Telehealth: Payer: Self-pay | Admitting: Cardiology

## 2013-06-07 NOTE — Telephone Encounter (Signed)
Follow Up      Calling in returning call. Please call back.

## 2013-06-07 NOTE — Telephone Encounter (Signed)
Advised wife of labs. Patient having a lot of bleeding in urine today. Discussed with  Dr. Patty Sermons and if patient continues to have heavy bleeding for 2 days ok to hold Xarelto for 1 day. Patient wife aware that holding his Xarelto is not something that he should do frequently and of increase risk. If she is noticing having to hold frequently will contact office.

## 2013-06-07 NOTE — Telephone Encounter (Signed)
Message copied by Burnell Blanks on Thu Jun 07, 2013  5:27 PM ------      Message from: Cassell Clement      Created: Wed Jun 06, 2013  9:48 PM       Please report to patient.  The recent labs are stable. Continue same medication and careful diet. ------

## 2013-06-15 ENCOUNTER — Ambulatory Visit (HOSPITAL_COMMUNITY): Payer: Medicare Other | Attending: Cardiology | Admitting: Radiology

## 2013-06-15 DIAGNOSIS — I10 Essential (primary) hypertension: Secondary | ICD-10-CM | POA: Insufficient documentation

## 2013-06-15 DIAGNOSIS — F411 Generalized anxiety disorder: Secondary | ICD-10-CM | POA: Insufficient documentation

## 2013-06-15 DIAGNOSIS — I4891 Unspecified atrial fibrillation: Secondary | ICD-10-CM

## 2013-06-15 DIAGNOSIS — Z8673 Personal history of transient ischemic attack (TIA), and cerebral infarction without residual deficits: Secondary | ICD-10-CM | POA: Insufficient documentation

## 2013-06-15 DIAGNOSIS — Z8551 Personal history of malignant neoplasm of bladder: Secondary | ICD-10-CM | POA: Insufficient documentation

## 2013-06-15 DIAGNOSIS — E785 Hyperlipidemia, unspecified: Secondary | ICD-10-CM | POA: Insufficient documentation

## 2013-06-15 NOTE — Progress Notes (Signed)
Echocardiogram performed.  

## 2013-06-18 ENCOUNTER — Telehealth: Payer: Self-pay | Admitting: *Deleted

## 2013-06-18 NOTE — Telephone Encounter (Signed)
Advised wife  

## 2013-06-18 NOTE — Telephone Encounter (Signed)
Message copied by Burnell Blanks on Mon Jun 18, 2013  3:37 PM ------      Message from: Cassell Clement      Created: Sun Jun 17, 2013  3:56 PM       Please report. Echo is satisfactory.  Good EF. Mild mitral regurgitation.      CSD ------

## 2013-06-25 ENCOUNTER — Other Ambulatory Visit: Payer: Self-pay | Admitting: Cardiology

## 2013-09-07 ENCOUNTER — Ambulatory Visit (INDEPENDENT_AMBULATORY_CARE_PROVIDER_SITE_OTHER): Payer: Medicare Other | Admitting: Cardiology

## 2013-09-07 ENCOUNTER — Encounter: Payer: Self-pay | Admitting: Cardiology

## 2013-09-07 VITALS — BP 140/82 | HR 97 | Ht 69.0 in | Wt 127.8 lb

## 2013-09-07 DIAGNOSIS — I4891 Unspecified atrial fibrillation: Secondary | ICD-10-CM

## 2013-09-07 DIAGNOSIS — Z7901 Long term (current) use of anticoagulants: Secondary | ICD-10-CM

## 2013-09-07 DIAGNOSIS — I10 Essential (primary) hypertension: Secondary | ICD-10-CM

## 2013-09-07 NOTE — Assessment & Plan Note (Signed)
The patient has a history of essential hypertension.  He has not been having any symptoms of CHF.  No headaches or dizzy spells.

## 2013-09-07 NOTE — Assessment & Plan Note (Signed)
The patient has permanent atrial fibrillation.  He is on Xarelto.  He occasionally has to hold his Xarelto because of gross hematuria.  He has not been experiencing any recurrent TIA symptoms.  He does have a history of a previous embolic stroke.  The stroke occurred when he was on Coumadin.

## 2013-09-07 NOTE — Assessment & Plan Note (Signed)
The patient has a past history of intermittent hematuria.  For the past week he states that his urine has been quite clear and he is hopeful that the recent intravesical chemotherapy may have been responsible for the improvement.

## 2013-09-07 NOTE — Progress Notes (Signed)
[name] Date of Birth:  11-26-1927 [facility] [address] [place], [place]  [address] [phone]         Fax   [phone]  History of Present Illness: [name] is seen back today for a followup office visit. Previously followed by Dr. Tonie Griffith and Arnette Felts in [facility]. Has had chronic atrial fib, difficult to control his rate. Previously on coumadin. Now with recent CVA. He had a right small nonhemorrhagic infarct on MRI. He had been on coumadin but INR was 1.6. He is now on Xarelto. His other problems include CRI, HLD, anemia, HTN, COPD, remote ITP with prior spleenectomy and pseudogout. He has had a remote echo back in 2010 showing an EF of 45 to 50%. He also has bladder cancer and is followed by Dr. Earlene Plater in [place].  Since last visit he has been doing well from the cardiac standpoint. He was able to play in a benefit golf tournament with his son and the patient scored a hole-in-one.  The patient has continued to have intermittent gross hematuria related to his known bladder cancer. He has had to stop the Xarelto intermittently because of this. Since the patient was last here he has completed a drug study involving intravesical instillation of an experimental drug to try to control his bladder tumors.  Current Outpatient Prescriptions  Medication Sig Dispense Refill  . Cholecalciferol (VITAMIN D) 2000 UNITS tablet Take 2,000-3,000 Units by mouth daily as needed. Patient takes 2000 units daily, except for taking 3000 units (1.5 tablets) on "winter days" and days when "not feeling well."      . digoxin (LANOXIN) 0.125 MG tablet Take 1 tablet (0.125 mg total) by mouth daily.  30 tablet  5  . diltiazem (CARDIZEM CD) 120 MG 24 hr capsule TAKE ONE CAPSULE BY MOUTH EVERY DAY  30 capsule  3  . Oxybutynin Chloride 10 % GEL Place onto the skin daily.      . Probiotic Product (PROBIOTIC DAILY PO) Take by mouth.      . Rivaroxaban (XARELTO) 15 MG TABS tablet Take 1  tablet (15 mg total) by mouth daily.  30 tablet  12   No current facility-administered medications for this visit.    Allergies  Allergen Reactions  . Diazepam     REACTION: agitation  . Ezetimibe-Simvastatin   . Morphine Other (See Comments)    Hallucinations.  . Sulfonamide Derivatives Hives    Patient Active Problem List   Diagnosis Date Noted  . Hypothermia 03/23/2013  . Constipation 10/16/2012  . Preventative health care 09/17/2012  . Nocturia 09/17/2012  . UTI (lower urinary tract infection) 08/22/2012  . Leukocytosis 08/22/2012  . Pseudogout 08/22/2012  . Encephalopathy 08/21/2012  . Left knee pain 08/21/2012  . Prerenal azotemia 08/21/2012  . CVA (cerebral vascular accident)-Acute right small nonhemorrhagic infarct seen on MRI 08/17/2012  . Lacunar stroke 04/07/2011  . Pleural effusion 04/07/2011  . 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 Long term current use of anticoagulant 04/07/2011  . NEOPLASM, MALIGNANT, BLADDER 04/10/2009  . HYPERLIPIDEMIA 04/10/2009  . ANEMIA-NOS 04/10/2009  . 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 ANXIETY 04/10/2009  . HYPERTENSION 04/10/2009  . Atrial fibrillation with RVR 04/10/2009  . COPD 04/10/2009  . TRANSIENT ISCHEMIC ATTACK, HX OF 04/10/2009    History  Smoking status  . Never Smoker   Smokeless tobacco  . Never Used    History  Alcohol Use No    Family History  Problem Relation Age  of Onset  . Heart disease Mother   . Arthritis Father     Review of Systems: Constitutional: no fever chills diaphoresis or fatigue or change in weight.  Head and neck: no hearing loss, no epistaxis, no photophobia or visual disturbance. Respiratory: No cough, shortness of breath or wheezing. Cardiovascular: No chest pain peripheral edema, palpitations. Gastrointestinal: No abdominal distention, no abdominal pain, no change in bowel habits hematochezia or melena. Genitourinary: No dysuria, no frequency, no urgency, no  nocturia. Musculoskeletal:No arthralgias, no back pain, no gait disturbance or myalgias. Neurological: No dizziness, no headaches, no numbness, no seizures, no syncope, no weakness, no tremors. Hematologic: No lymphadenopathy, no easy bruising. Psychiatric: No confusion, no hallucinations, no sleep disturbance.    Physical Exam: Filed Vitals:   09/07/13 1045  BP: 140/82  Pulse: 97   the general appearance reveals a well-developed well-nourished elderly gentleman in no distress.The head and neck exam reveals pupils equal and reactive.  Extraocular movements are full.  There is no scleral icterus.  The mouth and pharynx are normal.  The neck is supple.  The carotids reveal no bruits.  The jugular venous pressure is normal.  The  thyroid is not enlarged.  There is no lymphadenopathy.  The chest is clear to percussion and auscultation.  There are no rales or rhonchi.  Expansion of the chest is symmetrical.  The precordium is quiet.  The pulse is irregularly irregular.  The first heart sound is normal.  The second heart sound is physiologically split.  There is no murmur gallop rub or click.  There is no abnormal lift or heave.  The abdomen is soft and nontender.  The bowel sounds are normal.  The liver and spleen are not enlarged.  There are no abdominal masses.  There are no abdominal bruits.  Extremities reveal good pedal pulses.  There is no phlebitis or edema.  There is no cyanosis or clubbing.  Strength is normal and symmetrical in all extremities.  There is no lateralizing weakness.  There are no sensory deficits.  The skin is warm and dry.  There is no rash.  EKG shows atrial fibrillation with controlled ventricular response of 97 per minute.  No acute ischemic changes.   Assessment / Plan: Continue current medication.  He will need to have a cystoscopy and biopsy by Dr. Earlene Plater next month and his heart is strong enough to undergo this procedure.  Recheck here in 3 months for followup office  visit.

## 2013-09-07 NOTE — Patient Instructions (Addendum)
Your physician recommends that you continue on your current medications as directed. Please refer to the Current Medication list given to you today.  Your physician recommends that you schedule a follow-up appointment in: 3 MONTH OV 

## 2013-09-10 ENCOUNTER — Telehealth: Payer: Self-pay | Admitting: *Deleted

## 2013-09-10 NOTE — Telephone Encounter (Signed)
Received a call from Dr Darvin Neighbours office regarding patients Xarelto and upcoming procedure. Patient having cystoscope on 10/09/13 and preop 09/21/13, takes Xarelto and unsure of  Dr. Patty Sermons would like to bridge with Lovenox. Will forward to  Dr. Patty Sermons for review  Call back [phone] and fax [phone]

## 2013-09-10 NOTE — Telephone Encounter (Signed)
With surgery on 10/14 he should take his last dose of xarelto on the evening of 10/11. No need to bridge with lovenox.

## 2013-09-12 NOTE — Telephone Encounter (Signed)
Advised wife and faxed to [facility] at urology office

## 2013-09-16 ENCOUNTER — Other Ambulatory Visit: Payer: Self-pay | Admitting: Cardiology

## 2013-10-11 ENCOUNTER — Telehealth: Payer: Self-pay | Admitting: Cardiology

## 2013-10-11 NOTE — Telephone Encounter (Signed)
New problem      Pt had a biop of the bladder by Dr Darvin Neighbours.  10/14 and is bleeding heavily with colts.  Pt's wife would like a call back.    Thanks!

## 2013-10-11 NOTE — Telephone Encounter (Signed)
Discussed with  Dr. Patty Sermons and will have patient hold Xarelto tonight and wife will call with update tomorrow. Advised wife, verbalized understanding Biopsy was done Tuesday 1014/14

## 2013-10-12 NOTE — Telephone Encounter (Signed)
Spoke with wife this morning and patient had a very long night. Patient had several large blood clots that he was having a hard time passing. This am blood still visible but lighter. Discussed with  Dr. Patty Sermons and will have patient continue to hold Xarelto until Sunday. Advised wife and she will call Monday am with update.

## 2013-10-15 NOTE — Telephone Encounter (Signed)
Follow UP   Pt calling with the update// Pt has started Xarelto and has continual light bleeding in his urine// please call back to assist.

## 2013-10-15 NOTE — Telephone Encounter (Signed)
Good day Saturday, Saturday night, and yesterday urine very clear. Last night took Xarelto and urine has pink tinge this am. Will forward to  Dr. Patty Sermons for review Also, no cancer cells in biopsy.

## 2013-10-15 NOTE — Telephone Encounter (Signed)
Advised patient

## 2013-10-15 NOTE — Telephone Encounter (Signed)
Okay to continue Xarelto.

## 2013-10-30 ENCOUNTER — Telehealth: Payer: Self-pay

## 2013-10-30 NOTE — Telephone Encounter (Signed)
Pharmacist researched and advised against Bromelain, no data on Pygeium. Advised wife

## 2013-10-30 NOTE — Telephone Encounter (Signed)
Since some of these herbal preparations can affect blood coagulation I would be wary of taking them in his situation unless the preparations state that they have no effect on blood coagulation. Patient would need to check with supplier.

## 2013-10-30 NOTE — Telephone Encounter (Signed)
Will forward to  Dr. Brackbill for review 

## 2013-10-30 NOTE — Telephone Encounter (Signed)
New problem   Patients  wants to know can he take,  pygeum- africanum  &  Bromelain these are all natural medication.

## 2013-11-19 ENCOUNTER — Other Ambulatory Visit: Payer: Self-pay | Admitting: Dermatology

## 2013-11-19 ENCOUNTER — Telehealth: Payer: Self-pay | Admitting: Cardiology

## 2013-11-19 NOTE — Telephone Encounter (Signed)
Discussed with  Dr. Patty Sermons and ok to hold tonight, advised wife

## 2013-11-19 NOTE — Telephone Encounter (Signed)
New Problem  Pt wife called//Pt had surgery w/ dermatologist// bleeding really bad// wanting to know if he can discontinue Xarelto for 1 night// please call back to discuss.

## 2013-12-07 ENCOUNTER — Ambulatory Visit (INDEPENDENT_AMBULATORY_CARE_PROVIDER_SITE_OTHER): Payer: Medicare Other | Admitting: Cardiology

## 2013-12-07 ENCOUNTER — Encounter: Payer: Self-pay | Admitting: Cardiology

## 2013-12-07 VITALS — BP 131/92 | HR 103 | Ht 69.0 in | Wt 128.0 lb

## 2013-12-07 DIAGNOSIS — I4891 Unspecified atrial fibrillation: Secondary | ICD-10-CM

## 2013-12-07 DIAGNOSIS — I639 Cerebral infarction, unspecified: Secondary | ICD-10-CM

## 2013-12-07 DIAGNOSIS — I6350 Cerebral infarction due to unspecified occlusion or stenosis of unspecified cerebral artery: Secondary | ICD-10-CM

## 2013-12-07 DIAGNOSIS — C679 Malignant neoplasm of bladder, unspecified: Secondary | ICD-10-CM

## 2013-12-07 DIAGNOSIS — I10 Essential (primary) hypertension: Secondary | ICD-10-CM

## 2013-12-07 NOTE — Assessment & Plan Note (Signed)
Heart rate remains in the high normal range.  He is not having any symptoms of CHF or angina pectoris.

## 2013-12-07 NOTE — Patient Instructions (Signed)
START B COMPLEX MULTIVITAMIN DAILY  Avoid extra salt in your diet   Your physician recommends that you schedule a follow-up appointment in: 3 month ov/bmet/cbc

## 2013-12-07 NOTE — Assessment & Plan Note (Signed)
Blood pressure here in the office initially was elevated.  On recheck after he relaxed it was down to 120/85.  At times it is borderline high at home.  We talked about increasing his diltiazem dose but he would prefer not to do that since previously he did not tolerate higher doses.  His heart rate is very sensitive to small changes in medication.  We will continue current dose of diltiazem CD 120 mg daily and he will avoid extra salt in his food at home.

## 2013-12-07 NOTE — Progress Notes (Signed)
[name] Date of Birth:  1927/06/17 [address] [facility] [address] [place], [place]  [address] [phone]         Fax   [phone]  History of Present Illness: [name] is seen back today for a followup office visit. Previously followed by [name] and [name] in [facility]. Has had chronic atrial fib, difficult to control his rate. Previously on coumadin. Now with recent CVA. He had a right small nonhemorrhagic infarct on MRI. He had been on coumadin but INR was 1.6. He is now on Xarelto. His other problems include CRI, HLD, anemia, HTN, COPD, remote ITP with prior spleenectomy and pseudogout. He has had a remote echo back in 2010 showing an EF of 45 to 50%. He also has bladder cancer and is followed by [name] in [place].  Since last visit he has had successful bladder surgery and since then his urinary hematuria has subsided partially but not altogether.   Current Outpatient Prescriptions  Medication Sig Dispense Refill  . b complex vitamins tablet Take 1 tablet by mouth daily.      . Cholecalciferol (VITAMIN D) 2000 UNITS tablet Take 2,000-3,000 Units by mouth daily as needed. Patient takes 2000 units daily, except for taking 3000 units (1.5 tablets) on "winter days" and days when "not feeling well."      . digoxin (LANOXIN) 0.125 MG tablet Take 1 tablet (0.125 mg total) by mouth daily.  30 tablet  5  . diltiazem (CARDIZEM CD) 120 MG 24 hr capsule TAKE ONE CAPSULE BY MOUTH EVERY DAY  30 capsule  3  . Oxybutynin Chloride 10 % GEL Place onto the skin daily.      . Probiotic Product (PROBIOTIC DAILY PO) Take by mouth.      . Rivaroxaban (XARELTO) 15 MG TABS tablet Take 1 tablet (15 mg total) by mouth daily.  30 tablet  12   No current facility-administered medications for this visit.    Allergies  Allergen Reactions  . Diazepam     REACTION: agitation  . Ezetimibe-Simvastatin   . Morphine Other (See Comments)    Hallucinations.  . Sulfonamide Derivatives Hives     Patient Active Problem List   Diagnosis Date Noted  . Hypothermia 03/23/2013  . Constipation 10/16/2012  . Preventative health care 09/17/2012  . Nocturia 09/17/2012  . UTI (lower urinary tract infection) 08/22/2012  . Leukocytosis 08/22/2012  . Pseudogout 08/22/2012  . Encephalopathy 08/21/2012  . Left knee pain 08/21/2012  . Prerenal azotemia 08/21/2012  . CVA (cerebral vascular accident)-Acute right small nonhemorrhagic infarct seen on MRI 08/17/2012  . Lacunar stroke 04/07/2011  . Pleural effusion 04/07/2011  . 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 Long term current use of anticoagulant 04/07/2011  . NEOPLASM, MALIGNANT, BLADDER 04/10/2009  . HYPERLIPIDEMIA 04/10/2009  . ANEMIA-NOS 04/10/2009  . 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 ANXIETY 04/10/2009  . HYPERTENSION 04/10/2009  . Atrial fibrillation with RVR 04/10/2009  . COPD 04/10/2009  . TRANSIENT ISCHEMIC ATTACK, HX OF 04/10/2009    History  Smoking status  . Never Smoker   Smokeless tobacco  . Never Used    History  Alcohol Use No    Family History  Problem Relation Age of Onset  . Heart disease Mother   . Arthritis Father     Review of Systems: Constitutional: no fever chills diaphoresis or fatigue or change in weight.  Head and neck: no hearing loss, no epistaxis, no photophobia or visual disturbance. Respiratory: No cough,  shortness of breath or wheezing. Cardiovascular: No chest pain peripheral edema, palpitations. Gastrointestinal: No abdominal distention, no abdominal pain, no change in bowel habits hematochezia or melena. Genitourinary: No dysuria, no frequency, no urgency, no nocturia. Musculoskeletal:No arthralgias, no back pain, no gait disturbance or myalgias. Neurological: No dizziness, no headaches, no numbness, no seizures, no syncope, no weakness, no tremors. Hematologic: No lymphadenopathy, no easy bruising. Psychiatric: No confusion, no hallucinations, no sleep  disturbance.    Physical Exam: Filed Vitals:   12/07/13 0943  BP: 131/92  Pulse: 103   the general appearance reveals a well-developed well-nourished elderly gentleman in no distress.The head and neck exam reveals pupils equal and reactive.  Extraocular movements are full.  There is no scleral icterus.  The mouth and pharynx are normal.  The neck is supple.  The carotids reveal no bruits.  The jugular venous pressure is normal.  The  thyroid is not enlarged.  There is no lymphadenopathy.  The chest is clear to percussion and auscultation.  There are no rales or rhonchi.  Expansion of the chest is symmetrical.  The precordium is quiet.  The pulse is irregularly irregular.  The first heart sound is normal.  The second heart sound is physiologically split.  There is no murmur gallop rub or click.  There is no abnormal lift or heave.  The abdomen is soft and nontender.  The bowel sounds are normal.  The liver and spleen are not enlarged.  There are no abdominal masses.  There are no abdominal bruits.  Extremities reveal good pedal pulses.  There is no phlebitis or edema.  There is no cyanosis or clubbing.  Strength is normal and symmetrical in all extremities.  There is no lateralizing weakness.  There are no sensory deficits.  The skin is warm and dry.  There is no rash.  EKG shows atrial fibrillation with controlled ventricular response of 96 per minute.  No acute ischemic changes.  No change since last EKG tracing.   Assessment / Plan:  Recheck here in 3 months for followup office visit.  Avoid extra dietary salt.  He is requesting to headache B complex multivitamin to his regimen and this will be okay. We filled out a handicap automobile sticker for him today. At his next office visit we will check CBC and basal metabolic panel

## 2013-12-07 NOTE — Assessment & Plan Note (Signed)
The patient has had no further TIA or stroke symptoms.  He is on Xarelto.

## 2013-12-12 ENCOUNTER — Other Ambulatory Visit: Payer: Self-pay | Admitting: Cardiology

## 2013-12-25 ENCOUNTER — Encounter (HOSPITAL_COMMUNITY): Payer: Self-pay | Admitting: Emergency Medicine

## 2013-12-25 ENCOUNTER — Emergency Department (HOSPITAL_COMMUNITY): Payer: Medicare Other

## 2013-12-25 ENCOUNTER — Inpatient Hospital Stay (HOSPITAL_COMMUNITY)
Admission: EM | Admit: 2013-12-25 | Discharge: 2014-01-01 | DRG: 152 | Disposition: A | Discharge status: Rehabilitation Facility | Payer: Medicare Other | Attending: Internal Medicine | Admitting: Internal Medicine

## 2013-12-25 ENCOUNTER — Telehealth: Payer: Self-pay | Admitting: Cardiology

## 2013-12-25 DIAGNOSIS — Z8249 Family history of ischemic heart disease and other diseases of the circulatory system: Secondary | ICD-10-CM

## 2013-12-25 DIAGNOSIS — E785 Hyperlipidemia, unspecified: Secondary | ICD-10-CM | POA: Diagnosis present

## 2013-12-25 DIAGNOSIS — Z9081 Acquired absence of spleen: Secondary | ICD-10-CM

## 2013-12-25 DIAGNOSIS — N390 Urinary tract infection, site not specified: Secondary | ICD-10-CM | POA: Diagnosis present

## 2013-12-25 DIAGNOSIS — Z8551 Personal history of malignant neoplasm of bladder: Secondary | ICD-10-CM

## 2013-12-25 DIAGNOSIS — C679 Malignant neoplasm of bladder, unspecified: Secondary | ICD-10-CM

## 2013-12-25 DIAGNOSIS — J4489 Other specified chronic obstructive pulmonary disease: Secondary | ICD-10-CM | POA: Diagnosis present

## 2013-12-25 DIAGNOSIS — R1310 Dysphagia, unspecified: Secondary | ICD-10-CM | POA: Diagnosis present

## 2013-12-25 DIAGNOSIS — J90 Pleural effusion, not elsewhere classified: Secondary | ICD-10-CM

## 2013-12-25 DIAGNOSIS — I10 Essential (primary) hypertension: Secondary | ICD-10-CM | POA: Diagnosis present

## 2013-12-25 DIAGNOSIS — Z7901 Long term (current) use of anticoagulants: Secondary | ICD-10-CM

## 2013-12-25 DIAGNOSIS — R7309 Other abnormal glucose: Secondary | ICD-10-CM | POA: Diagnosis not present

## 2013-12-25 DIAGNOSIS — I4891 Unspecified atrial fibrillation: Secondary | ICD-10-CM | POA: Diagnosis present

## 2013-12-25 DIAGNOSIS — I6350 Cerebral infarction due to unspecified occlusion or stenosis of unspecified cerebral artery: Secondary | ICD-10-CM | POA: Diagnosis not present

## 2013-12-25 DIAGNOSIS — D693 Immune thrombocytopenic purpura: Secondary | ICD-10-CM | POA: Diagnosis present

## 2013-12-25 DIAGNOSIS — R7989 Other specified abnormal findings of blood chemistry: Secondary | ICD-10-CM

## 2013-12-25 DIAGNOSIS — Z8673 Personal history of transient ischemic attack (TIA), and cerebral infarction without residual deficits: Secondary | ICD-10-CM

## 2013-12-25 DIAGNOSIS — R32 Unspecified urinary incontinence: Secondary | ICD-10-CM | POA: Diagnosis present

## 2013-12-25 DIAGNOSIS — R319 Hematuria, unspecified: Secondary | ICD-10-CM

## 2013-12-25 DIAGNOSIS — I6381 Other cerebral infarction due to occlusion or stenosis of small artery: Secondary | ICD-10-CM | POA: Diagnosis present

## 2013-12-25 DIAGNOSIS — J449 Chronic obstructive pulmonary disease, unspecified: Secondary | ICD-10-CM | POA: Diagnosis present

## 2013-12-25 DIAGNOSIS — J111 Influenza due to unidentified influenza virus with other respiratory manifestations: Principal | ICD-10-CM | POA: Diagnosis present

## 2013-12-25 DIAGNOSIS — D649 Anemia, unspecified: Secondary | ICD-10-CM | POA: Diagnosis present

## 2013-12-25 DIAGNOSIS — R6510 Systemic inflammatory response syndrome (SIRS) of non-infectious origin without acute organ dysfunction: Secondary | ICD-10-CM | POA: Diagnosis not present

## 2013-12-25 DIAGNOSIS — R7303 Prediabetes: Secondary | ICD-10-CM | POA: Diagnosis present

## 2013-12-25 DIAGNOSIS — I639 Cerebral infarction, unspecified: Secondary | ICD-10-CM | POA: Diagnosis present

## 2013-12-25 DIAGNOSIS — Z9089 Acquired absence of other organs: Secondary | ICD-10-CM

## 2013-12-25 DIAGNOSIS — G9340 Encephalopathy, unspecified: Secondary | ICD-10-CM | POA: Diagnosis present

## 2013-12-25 LAB — BASIC METABOLIC PANEL
CO2: 28 mEq/L (ref 19–32)
Calcium: 8.7 mg/dL (ref 8.4–10.5)
Creatinine, Ser: 1.04 mg/dL (ref 0.50–1.35)
GFR calc Af Amer: 74 mL/min — ABNORMAL LOW (ref >90)
Sodium: 134 mEq/L — ABNORMAL LOW (ref 137–147)

## 2013-12-25 LAB — URINE MICROSCOPIC-ADD ON

## 2013-12-25 LAB — CBC WITH DIFFERENTIAL/PLATELET
Basophils Absolute: 0 10*3/uL (ref 0.0–0.1)
Basophils Relative: 0 % (ref 0–1)
Eosinophils Relative: 0 % (ref 0–5)
HCT: 35.4 % — ABNORMAL LOW (ref 39.0–52.0)
Lymphocytes Relative: 16 % (ref 12–46)
MCH: 33.5 pg (ref 26.0–34.0)
MCHC: 34.5 g/dL (ref 30.0–36.0)
MCV: 97.3 fL (ref 78.0–100.0)
Neutro Abs: 4.2 10*3/uL (ref 1.7–7.7)
Platelets: 168 10*3/uL (ref 150–400)
RDW: 14.2 % (ref 11.5–15.5)
WBC: 5.6 10*3/uL (ref 4.0–10.5)

## 2013-12-25 LAB — URINALYSIS, ROUTINE W REFLEX MICROSCOPIC
Bilirubin Urine: NEGATIVE
Glucose, UA: NEGATIVE mg/dL
Ketones, ur: 15 mg/dL — AB
Protein, ur: 100 mg/dL — AB
Specific Gravity, Urine: 1.019 (ref 1.005–1.030)
Urobilinogen, UA: 0.2 mg/dL (ref 0.0–1.0)

## 2013-12-25 LAB — POCT I-STAT TROPONIN I

## 2013-12-25 LAB — DIGOXIN LEVEL: Digoxin Level: 0.9 ng/mL (ref 0.8–2.0)

## 2013-12-25 MED ORDER — SODIUM CHLORIDE 0.9 % IV BOLUS (SEPSIS)
500.0000 mL | Freq: Once | INTRAVENOUS | Status: AC
  Administered 2013-12-25: 500 mL via INTRAVENOUS

## 2013-12-25 MED ORDER — ACETAMINOPHEN 325 MG PO TABS
650.0000 mg | ORAL_TABLET | Freq: Once | ORAL | Status: AC
  Administered 2013-12-25: 650 mg via ORAL
  Filled 2013-12-25: qty 2

## 2013-12-25 MED ORDER — DEXTROSE 5 % IV SOLN
1.0000 g | Freq: Once | INTRAVENOUS | Status: AC
  Administered 2013-12-25: 1 g via INTRAVENOUS
  Filled 2013-12-25: qty 10

## 2013-12-25 NOTE — ED Notes (Signed)
Reported increasing fever to provider and he put in orders for blood culture. Provider asked that we stop ABX until blood culture completed and then restart. ABX stopped at this time.

## 2013-12-25 NOTE — Telephone Encounter (Signed)
Agree with advice given

## 2013-12-25 NOTE — Telephone Encounter (Signed)
Spoke with wife she states she thinks she needs to call 911. She is having a hard time getting patient to understanding what she is telling him, his blood pressure was 149/103 and heart rate 91. Had a lot of bleeding last night. Advised her she needed to hang up and call 911, verbalized understanding.

## 2013-12-25 NOTE — ED Provider Notes (Signed)
CSN: [id number]     Arrival date & time 12/25/13  1357 History   First MD Initiated Contact with Patient 12/25/13 1505     Chief Complaint  Patient presents with  . Hematuria   (Consider location/radiation/quality/duration/timing/severity/associated sxs/prior Treatment) HPI Comments: Patient presents to the ER for generalized weakness and hematuria. Patient has a history of bladder cancer, treated at [facility]. Patient reportedly had large amounts of blood in his urine output last night. He also felt some pain with passage of urine. He uses a condom catheter at night and a diaper during the day. Today he has been very fatigued, very weak and wife reports he has not been behaving like his normal self. Wife also reports that earlier today she took his vital signs and he was hypertensive, 140/100 and was tachycardic in the 130s. He does have a history of atrial fibrillation. Patient is on chronic Xarelto. No nausea, vomiting or diarrhea. Patient is without complaints currently.  Patient is a 79 y.o. male presenting with hematuria.  Hematuria    Past Medical History  Diagnosis Date  . NEOPLASM, MALIGNANT, BLADDER 04/10/2009  . HYPERLIPIDEMIA 04/10/2009  . ANEMIA-NOS 04/10/2009  . 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 ANXIETY 04/10/2009  . HYPERTENSION 04/10/2009  . Atrial fibrillation 04/10/2009  . COPD 04/10/2009  . TRANSIENT ISCHEMIC ATTACK, HX OF 04/10/2009  . Bladder cancer     Dr. Darvin Neighbours  . Lacunar stroke 04/07/2011  . Pleural effusion 04/07/2011  . 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 Stroke    Past Surgical History  Procedure Laterality Date  . Splenectomy    . Bilateral vats ablation    . Facial cancer      facial skin cancer   Family History  Problem Relation Age of Onset  . Heart disease Mother   . Arthritis Father    History  Substance Use Topics  . Smoking status: Never Smoker   . Smokeless tobacco: Never Used  . Alcohol Use: No    Review of Systems   Constitutional: Positive for fatigue.  Genitourinary: Positive for hematuria.  Neurological: Positive for weakness.  Psychiatric/Behavioral: Positive for confusion.  All other systems reviewed and are negative.    Allergies  Diazepam; Ezetimibe-simvastatin; Morphine; and Sulfonamide derivatives  Home Medications   Current Outpatient Rx  Name  Route  Sig  Dispense  Refill  . Cholecalciferol (VITAMIN D) 2000 UNITS tablet   Oral   Take 2,000-3,000 Units by mouth daily. Patient takes 2000 units daily, except for taking 3000 units (1.5 tablets) on "winter days" and days when "not feeling well."         . digoxin (LANOXIN) 0.125 MG tablet   Oral   Take 1 tablet (0.125 mg total) by mouth daily.   30 tablet   5   . diltiazem (DILACOR XR) 120 MG 24 hr capsule   Oral   Take 120 mg by mouth daily.         . Probiotic Product (PROBIOTIC DAILY PO)   Oral   Take 1 tablet by mouth every morning.          . Rivaroxaban (XARELTO) 15 MG TABS tablet   Oral   Take 1 tablet (15 mg total) by mouth daily.   30 tablet   12     Change in dose    BP 134/58  Pulse 88  Temp(Src) 99.4 F (37.4 C) (Oral)  Resp 16  SpO2 95% Physical Exam  Constitutional: He is oriented to  person, place, and time. He appears well-developed and well-nourished. No distress.  HENT:  Head: Normocephalic and atraumatic.  Right Ear: Hearing normal.  Left Ear: Hearing normal.  Nose: Nose normal.  Mouth/Throat: Oropharynx is clear and moist and mucous membranes are normal.  Eyes: Conjunctivae and EOM are normal. Pupils are equal, round, and reactive to light.  Neck: Normal range of motion. Neck supple.  Cardiovascular: Regular rhythm, S1 normal and S2 normal.  Exam reveals no gallop and no friction rub.   No murmur heard. Pulmonary/Chest: Effort normal and breath sounds normal. No respiratory distress. He exhibits no tenderness.  Abdominal: Soft. Normal appearance and bowel sounds are normal. There is no  hepatosplenomegaly. There is no tenderness. There is no rebound, no guarding, no tenderness at McBurney's point and negative Murphy's sign. No hernia.  Musculoskeletal: Normal range of motion.  Neurological: He is alert and oriented to person, place, and time. He has normal strength. No cranial nerve deficit or sensory deficit. Coordination normal. GCS eye subscore is 4. GCS verbal subscore is 5. GCS motor subscore is 6.  Skin: Skin is warm, dry and intact. No rash noted. No cyanosis.  Psychiatric: He has a normal mood and affect. His speech is normal and behavior is normal. Thought content normal.    ED Course  Procedures (including critical care time) Labs Review Labs Reviewed  URINALYSIS, ROUTINE W REFLEX MICROSCOPIC - Abnormal; Notable for the following:    Color, Urine RED (*)    APPearance TURBID (*)    Hgb urine dipstick LARGE (*)    Ketones, ur 15 (*)    Protein, ur 100 (*)    Leukocytes, UA SMALL (*)    All other components within normal limits  CBC WITH DIFFERENTIAL - Abnormal; Notable for the following:    RBC 3.64 (*)    Hemoglobin 12.2 (*)    HCT 35.4 (*)    All other components within normal limits  BASIC METABOLIC PANEL - Abnormal; Notable for the following:    Sodium 134 (*)    Chloride 95 (*)    Glucose, Bld 100 (*)    BUN 24 (*)    GFR calc non Af Amer 64 (*)    GFR calc Af Amer 74 (*)    All other components within normal limits  CULTURE, BLOOD (ROUTINE X 2)  CULTURE, BLOOD (ROUTINE X 2)  DIGOXIN LEVEL  URINE MICROSCOPIC-ADD ON  CG4 I-STAT (LACTIC ACID)  POCT I-STAT TROPONIN I   Imaging Review No results found.  EKG Interpretation    Date/Time:  Tuesday December 25 2013 14:55:14 EST Ventricular Rate:  85 PR Interval:    QRS Duration: 97 QT Interval:  335 QTC Calculation: 398 R Axis:   97 Text Interpretation:  Atrial fibrillation Low voltage, extremity leads Consider right ventricular hypertrophy Nonspecific T abnormalities, lateral leads Baseline  wander in lead(s) I aVR V5 No significant change since last tracing Confirmed by Gabrelle Roca  MD, Shakeia Krus ([id number]) on 12/25/2013 3:23:36 PM            MDM  Diagnosis: 1. Weakness 2. Fever 3. UTI 4. Hematuria 5. Bladder CA  Patient presented to the ER for evaluation of altered status with generalized weakness. Patient had onset of hematuria overnight and there was complaint of dysuria as well. Patient has a previous diagnosis of bladder cancer, treated at [facility].  Workup reveals elevated BUN, normal creatinine. Urinalysis does suggest infection, culture pending. CBC is normal except for hemoglobin 12.2. This  does not however, appear to be the patient's baseline.  Patient did become febrile here in the ER. Blood cultures were obtained, however Rocephin has been started prior to onset of the fever. Rocephin was stopped and cultures obtained. Patient continues to have some mild confusion and is extremely weak. All hospitalist to admit the patient for further management.   Gilda Crease, MD 12/25/13 2002

## 2013-12-25 NOTE — H&P (Signed)
[facility] History and Physical  [name] ZHY:[id number] DOB: [birth year]    PCP:   Oliver Barre, MD   Chief Complaint: weakness, confusion, dysuria, and hematuria.  HPI: [name] is an 79 y.o. male with hx of bladder cancer, undergoing new treatment at outside facility, hx of afib on Xarelto (Dr Patty Sermons), prior CVA, HTN, hyperlipidemia, s/p splenectomy, brought to the ER as he was confused this am.  He didn't recognized his son, and was not feeling right.  He had some dark urine, and was bloody. Wife stated that he was feeling a little feverish.  There were no nausea, vomiting, chest pain or shortness of breath.  He had a slight nonproductive coughs.  Evalaution in the ER included a UA, which showed only 2 WBC, TNTC RBCs, and a normal WBC and HB of 12 g, with normal Cr and electrolytes.  His EKG showed afib, rate control.  He has a low grade temp of 100F.  He is now alert, orient, and recognized his family and correctly stated his son's birthday.  Suspicious that he has a UTI, he was given Rocephin, and hospitalist was asked to admit him for further Tx.  Rewiew of Systems:  Constitutional: Negative for malaise, fever and chills. No significant weight loss or weight gain Eyes: Negative for eye pain, redness and discharge, diplopia, visual changes, or flashes of light. ENMT: Negative for ear pain, hoarseness, nasal congestion, sinus pressure and sore throat. No headaches; tinnitus, drooling, or problem swallowing. Cardiovascular: Negative for chest pain, palpitations, diaphoresis, dyspnea and peripheral edema. ; No orthopnea, PND Respiratory: Negative for cough, hemoptysis, wheezing and stridor. No pleuritic chestpain. Gastrointestinal: Negative for nausea, vomiting, diarrhea, constipation, abdominal pain, melena, blood in stool, hematemesis, jaundice and rectal bleeding.    Genitourinary: Negative for frequency, dysuria, incontinence,flank pain and hematuria; Musculoskeletal:  Negative for back pain and neck pain. Negative for swelling and trauma.;  Skin: . Negative for pruritus, rash, abrasions, bruising and skin lesion.; ulcerations Neuro: Negative for headache, lightheadedness and neck stiffness. Negative for weakness, altered level of consciousness , altered mental status, extremity weakness, burning feet, involuntary movement, seizure and syncope.  Psych: negative for anxiety, depression, insomnia, tearfulness, panic attacks, hallucinations, paranoia, suicidal or homicidal ideation    Past Medical History  Diagnosis Date  . NEOPLASM, MALIGNANT, BLADDER 04/10/2009  . HYPERLIPIDEMIA 04/10/2009  . ANEMIA-NOS 04/10/2009  . 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 ANXIETY 04/10/2009  . HYPERTENSION 04/10/2009  . Atrial fibrillation 04/10/2009  . COPD 04/10/2009  . TRANSIENT ISCHEMIC ATTACK, HX OF 04/10/2009  . Bladder cancer     Dr. Darvin Neighbours  . Lacunar stroke 04/07/2011  . Pleural effusion 04/07/2011  . 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 Stroke     Past Surgical History  Procedure Laterality Date  . Splenectomy    . Bilateral vats ablation    . Facial cancer      facial skin cancer    Medications:  HOME MEDS: Prior to Admission medications   Medication Sig Start Date End Date Taking? Authorizing Provider  Cholecalciferol (VITAMIN D) 2000 UNITS tablet Take 2,000-3,000 Units by mouth daily. Patient takes 2000 units daily, except for taking 3000 units (1.5 tablets) on "winter days" and days when "not feeling well."   Yes Historical Provider, MD  digoxin (LANOXIN) 0.125 MG tablet Take 1 tablet (0.125 mg total) by mouth daily. 04/24/13  Yes Cassell Clement, MD  diltiazem (DILACOR XR) 120 MG 24 hr capsule Take 120 mg by mouth daily.  Yes Historical Provider, MD  Probiotic Product (PROBIOTIC DAILY PO) Take 1 tablet by mouth every morning.    Yes Historical Provider, MD  Rivaroxaban (XARELTO) 15 MG TABS tablet Take 1 tablet (15 mg total) by mouth daily. 06/05/13   Yes Cassell Clement, MD     Allergies:  Allergies  Allergen Reactions  . Diazepam     REACTION: agitation  . Ezetimibe-Simvastatin     unknown  . Morphine Other (See Comments)    Hallucinations.  . Sulfonamide Derivatives Hives    Social History:   reports that he has never smoked. He has never used smokeless tobacco. He reports that he does not drink alcohol. His drug history is not on file.  Family History: Family History  Problem Relation Age of Onset  . Heart disease Mother   . Arthritis Father      Physical Exam: Filed Vitals:   12/25/13 1721 12/25/13 1725 12/25/13 1803 12/25/13 2106  BP: 131/56     Pulse: 77     Temp:  99.4 F (37.4 C) 100.2 F (37.9 C) 102.6 F (39.2 C)  TempSrc:  Oral Oral Rectal  Resp: 18     SpO2: 96%      Blood pressure 131/56, pulse 77, temperature 102.6 F (39.2 C), temperature source Rectal, resp. rate 18, SpO2 96.00%.  GEN:  Pleasant  patient lying in the stretcher in no acute distress; cooperative with exam. PSYCH:  alert and oriented x4; does not appear anxious or depressed; affect is appropriate. HEENT: Mucous membranes pink and anicteric; PERRLA; EOM intact; no cervical lymphadenopathy nor thyromegaly or carotid bruit; no JVD; There were no stridor. Neck is very supple. Breasts:: Not examined CHEST WALL: No tenderness CHEST: Normal respiration, clear to auscultation bilaterally.  HEART: Regular rate and rhythm.  There are no murmur, rub, or gallops.   BACK: No kyphosis or scoliosis; no CVA tenderness ABDOMEN: soft and non-tender; no masses, no organomegaly, normal abdominal bowel sounds; no pannus; no intertriginous candida. There is no rebound and no distention. Rectal Exam: Not done EXTREMITIES: No bone or joint deformity; age-appropriate arthropathy of the hands and knees; no edema; no ulcerations.  There is no calf tenderness. Genitalia: not examined PULSES: 2+ and symmetric SKIN: Normal hydration no rash or  ulceration CNS: Cranial nerves 2-12 grossly intact no focal lateralizing neurologic deficit.  Speech is fluent; uvula elevated with phonation, facial symmetry and tongue midline. DTR are normal bilaterally, cerebella exam is intact, barbinski is negative and strengths are equaled bilaterally.  No sensory loss.   Labs on Admission:  Basic Metabolic Panel:  Recent Labs Lab 12/25/13 1549  NA 134*  K 4.7  CL 95*  CO2 28  GLUCOSE 100*  BUN 24*  CREATININE 1.04  CALCIUM 8.7   Liver Function Tests: No results found for this basename: AST, ALT, ALKPHOS, BILITOT, PROT, ALBUMIN,  in the last 168 hours No results found for this basename: LIPASE, AMYLASE,  in the last 168 hours No results found for this basename: AMMONIA,  in the last 168 hours CBC:  Recent Labs Lab 12/25/13 1549  WBC 5.6  NEUTROABS 4.2  HGB 12.2*  HCT 35.4*  MCV 97.3  PLT 168   Cardiac Enzymes: No results found for this basename: CKTOTAL, CKMB, CKMBINDEX, TROPONINI,  in the last 168 hours  CBG: No results found for this basename: GLUCAP,  in the last 168 hours   Radiological Exams on Admission: Dg Chest 2 View  12/25/2013   CLINICAL DATA:  Weakness  EXAM: CHEST  2 VIEW  COMPARISON:  08/21/2012  FINDINGS: Heart size and mediastinal contours are normal. The vascular pattern is normal. Bilateral pleural apical thickening is stable. There is no consolidation or effusion. Chronic left rib deformities due to prior fractures, stable.  IMPRESSION: No active cardiopulmonary disease.   Electronically Signed   By: Esperanza Heir M.D.   On: 12/25/2013 16:55    EKG: Independently reviewed. Afib, with no acute ST T changes.  Rate controlled.   Assessment/Plan Present on Admission:  . UTI (lower urinary tract infection) . Lacunar stroke . Encephalopathy . HYPERTENSION . Atrial fibrillation  PLAN:  Will admit him for encephalopathy, suspicious clinically that he has a UTI (dysuria, hematuria, fever), though UA was not  positive for such, in the setting of known bladder cancer.   Also need to r/out influenza.  Will give very little IVF, IV Rocephin, place on precaution and obtain influenza PCR.  I discussed risk and benefit, and will hold Xarelto, follow Hct.  Family understood the rational, and agreed.  He is stable, full code (reconfirmed tonight), and will be admitted to [facility] service.  Thank you for allowing me to participate in his care.  Other plans as per orders.  Code Status: FULL Unk Lightning, MD. [facility] Pager [phone] 7pm to 7am.  12/25/2013, 9:33 PM

## 2013-12-25 NOTE — ED Notes (Signed)
Rn called report x3. 3West not ready d/t situation on floor.

## 2013-12-25 NOTE — ED Notes (Signed)
Onset one day ago hematuria continued today. Uses condom catheter at night. History of bladder cancer.

## 2013-12-25 NOTE — Telephone Encounter (Signed)
New message     On xarelto.   Pt has blood in urine every time he goes to the bathroom.  It started yesterday afternoon.  Pt also does not feel good---don't know if something else is going on.  Pls call

## 2013-12-25 NOTE — ED Notes (Signed)
PT felt warm to the touch. Took oral temp 100.45F

## 2013-12-26 ENCOUNTER — Encounter (HOSPITAL_COMMUNITY): Payer: Self-pay

## 2013-12-26 DIAGNOSIS — D649 Anemia, unspecified: Secondary | ICD-10-CM

## 2013-12-26 DIAGNOSIS — Z9089 Acquired absence of other organs: Secondary | ICD-10-CM

## 2013-12-26 DIAGNOSIS — J90 Pleural effusion, not elsewhere classified: Secondary | ICD-10-CM

## 2013-12-26 DIAGNOSIS — C679 Malignant neoplasm of bladder, unspecified: Secondary | ICD-10-CM

## 2013-12-26 DIAGNOSIS — R3989 Other symptoms and signs involving the genitourinary system: Secondary | ICD-10-CM

## 2013-12-26 LAB — CBC
HCT: 33.5 % — ABNORMAL LOW (ref 39.0–52.0)
Hemoglobin: 11.5 g/dL — ABNORMAL LOW (ref 13.0–17.0)
MCH: 33.4 pg (ref 26.0–34.0)
MCHC: 34.3 g/dL (ref 30.0–36.0)
MCV: 97.4 fL (ref 78.0–100.0)
Platelets: 146 10*3/uL — ABNORMAL LOW (ref 150–400)
RBC: 3.44 MIL/uL — ABNORMAL LOW (ref 4.22–5.81)
RDW: 14.4 % (ref 11.5–15.5)
WBC: 4.7 10*3/uL (ref 4.0–10.5)

## 2013-12-26 LAB — URINALYSIS, ROUTINE W REFLEX MICROSCOPIC
Bilirubin Urine: NEGATIVE
Glucose, UA: NEGATIVE mg/dL
Ketones, ur: NEGATIVE mg/dL
Leukocytes, UA: NEGATIVE
Nitrite: NEGATIVE
Protein, ur: 30 mg/dL — AB
Specific Gravity, Urine: 1.017 (ref 1.005–1.030)
Urobilinogen, UA: 0.2 mg/dL (ref 0.0–1.0)
pH: 7 (ref 5.0–8.0)

## 2013-12-26 LAB — BASIC METABOLIC PANEL
BUN: 21 mg/dL (ref 6–23)
CO2: 29 mEq/L (ref 19–32)
Calcium: 8.6 mg/dL (ref 8.4–10.5)
Chloride: 98 mEq/L (ref 96–112)
Creatinine, Ser: 1.05 mg/dL (ref 0.50–1.35)
GFR calc Af Amer: 73 mL/min — ABNORMAL LOW (ref >90)
GFR calc non Af Amer: 63 mL/min — ABNORMAL LOW (ref >90)
Glucose, Bld: 96 mg/dL (ref 70–99)
Potassium: 4.4 mEq/L (ref 3.7–5.3)
Sodium: 138 mEq/L (ref 137–147)

## 2013-12-26 LAB — INFLUENZA PANEL BY PCR (TYPE A & B)
H1N1 flu by pcr: NOT DETECTED
Influenza A By PCR: POSITIVE — AB
Influenza B By PCR: NEGATIVE

## 2013-12-26 LAB — URINE MICROSCOPIC-ADD ON

## 2013-12-26 MED ORDER — DEXTROSE-NACL 5-0.9 % IV SOLN
INTRAVENOUS | Status: DC
  Administered 2013-12-26 (×2): via INTRAVENOUS

## 2013-12-26 MED ORDER — SODIUM CHLORIDE 0.9 % IJ SOLN
3.0000 mL | Freq: Two times a day (BID) | INTRAMUSCULAR | Status: DC
  Administered 2013-12-26 – 2013-12-30 (×6): 3 mL via INTRAVENOUS

## 2013-12-26 MED ORDER — DIGOXIN 125 MCG PO TABS
0.1250 mg | ORAL_TABLET | Freq: Every day | ORAL | Status: DC
  Administered 2013-12-26 – 2014-01-01 (×6): 0.125 mg via ORAL
  Filled 2013-12-26 (×7): qty 1

## 2013-12-26 MED ORDER — ACETAMINOPHEN 325 MG PO TABS
650.0000 mg | ORAL_TABLET | Freq: Four times a day (QID) | ORAL | Status: DC | PRN
  Administered 2013-12-26: 650 mg via ORAL
  Filled 2013-12-26: qty 2

## 2013-12-26 MED ORDER — OSELTAMIVIR PHOSPHATE 75 MG PO CAPS
75.0000 mg | ORAL_CAPSULE | Freq: Two times a day (BID) | ORAL | Status: AC
  Administered 2013-12-26 – 2013-12-30 (×9): 75 mg via ORAL
  Filled 2013-12-26 (×10): qty 1

## 2013-12-26 MED ORDER — LEVOFLOXACIN 500 MG PO TABS
500.0000 mg | ORAL_TABLET | Freq: Every day | ORAL | Status: DC
  Administered 2013-12-26 – 2013-12-31 (×5): 500 mg via ORAL
  Filled 2013-12-26 (×6): qty 1

## 2013-12-26 MED ORDER — VITAMIN D 1000 UNITS PO TABS
3000.0000 [IU] | ORAL_TABLET | Freq: Every day | ORAL | Status: DC
  Administered 2013-12-26 – 2014-01-01 (×5): 3000 [IU] via ORAL
  Filled 2013-12-26 (×7): qty 3

## 2013-12-26 MED ORDER — FLORA-Q PO CAPS
1.0000 | ORAL_CAPSULE | Freq: Every morning | ORAL | Status: DC
  Administered 2013-12-26 – 2014-01-01 (×5): 1 via ORAL
  Filled 2013-12-26 (×7): qty 1

## 2013-12-26 MED ORDER — DEXTROSE 5 % IV SOLN
1.0000 g | INTRAVENOUS | Status: DC
  Filled 2013-12-26: qty 10

## 2013-12-26 MED ORDER — DEXTROSE 5 % IV SOLN: 1.0000 g | INTRAVENOUS | Status: DC

## 2013-12-26 MED ORDER — ACETAMINOPHEN 650 MG RE SUPP: 650.0000 mg | Freq: Four times a day (QID) | RECTAL | Status: DC | PRN

## 2013-12-26 MED ORDER — DILTIAZEM HCL ER 120 MG PO CP24
120.0000 mg | ORAL_CAPSULE | Freq: Every day | ORAL | Status: DC
  Administered 2013-12-26 – 2013-12-27 (×2): 120 mg via ORAL
  Filled 2013-12-26 (×2): qty 1

## 2013-12-26 NOTE — Progress Notes (Signed)
[facility] PROGRESS NOTE  [name] GMW:[id number] DOB: [birth year] DOA: 12/25/2013 PCP: Oliver Barre, MD  HPI/Subjective: Wife at bedside, feeding him. Awake and alert, oriented x2 to place and person not to time  Assessment/Plan: Principal Problem:   Encephalopathy Active Problems:   HYPERTENSION   Lacunar stroke   Post-splenectomy   Long term current use of anticoagulant   UTI (lower urinary tract infection)   Atrial fibrillation   Influenza with URT manifestations -Patient mentioned he has some cough and generalized that he aches. -Tested positive for type A influenza, started on Tamiflu today. -Probably this is a cause of his generalized weakness, encephalopathy and fever.  Acute encephalopathy -Likely secondary to influenza and questionable UTI -Patient has dysuria, hematuria fever but urinalysis has no pus cells. -Anyway started on IV Rocephin, had fever of 102.6 last night. -He is more awake and alert today, continue current antibiotic therapy, followup on urine and blood cultures.  SIRS -With fever and heart rate of 103 this is considered to SIRS. -Started on antibiotics empirically, patient is on IV Rocephin. -If patient spikes fever again today, I will consider to switch antibiotics to Zosyn and vancomycin.  Atrial fibrillation -Rate is controlled now with digoxin and diltiazem. -Patient is on Xarelto, this is held because of hematuria. -Likely to be restarted prior to discharge.  Hematuria -Questionable UTI, RBCs too numerous to count, likely secondary to anticoagulation. -Patient is on Xarelto, this is held. -Repeat urinalysis as his urine is more clear today, likely can restart Xarelto in AM.  Code Status: Full code Family Communication: Plan discussed with the patient. Disposition Plan: Remains inpatient   Consultants:  None  Procedures:  None  Antibiotics:  None   Objective: Filed Vitals:   12/26/13 1105  BP:   Pulse: 76   Temp:   Resp:     Intake/Output Summary (Last 24 hours) at 12/26/13 1233 Last data filed at 12/26/13 0900  Gross per 24 hour  Intake    740 ml  Output    850 ml  Net   -110 ml   Filed Weights   12/25/13 2335  Weight: 77.747 kg (171 lb 6.4 oz)    Exam: General: Alert and awake, oriented x3, not in any acute distress. HEENT: anicteric sclera, pupils reactive to light and accommodation, EOMI CVS: S1-S2 clear, no murmur rubs or gallops Chest: clear to auscultation bilaterally, no wheezing, rales or rhonchi Abdomen: soft nontender, nondistended, normal bowel sounds, no organomegaly Extremities: no cyanosis, clubbing or edema noted bilaterally Neuro: Cranial nerves II-XII intact, no focal neurological deficits  Data Reviewed: Basic Metabolic Panel:  Recent Labs Lab 12/25/13 1549 12/26/13 0440  NA 134* 138  K 4.7 4.4  CL 95* 98  CO2 28 29  GLUCOSE 100* 96  BUN 24* 21  CREATININE 1.04 1.05  CALCIUM 8.7 8.6   Liver Function Tests: No results found for this basename: AST, ALT, ALKPHOS, BILITOT, PROT, ALBUMIN,  in the last 168 hours No results found for this basename: LIPASE, AMYLASE,  in the last 168 hours No results found for this basename: AMMONIA,  in the last 168 hours CBC:  Recent Labs Lab 12/25/13 1549 12/26/13 0440  WBC 5.6 4.7  NEUTROABS 4.2  --   HGB 12.2* 11.5*  HCT 35.4* 33.5*  MCV 97.3 97.4  PLT 168 146*   Cardiac Enzymes: No results found for this basename: CKTOTAL, CKMB, CKMBINDEX, TROPONINI,  in the last 168 hours BNP (last 3 results) No results found for  this basename: PROBNP,  in the last 8760 hours CBG: No results found for this basename: GLUCAP,  in the last 168 hours  Micro No results found for this or any previous visit (from the past 240 hour(s)).   Studies: Dg Chest 2 View  12/25/2013   CLINICAL DATA:  Weakness  EXAM: CHEST  2 VIEW  COMPARISON:  08/21/2012  FINDINGS: Heart size and mediastinal contours are normal. The vascular  pattern is normal. Bilateral pleural apical thickening is stable. There is no consolidation or effusion. Chronic left rib deformities due to prior fractures, stable.  IMPRESSION: No active cardiopulmonary disease.   Electronically Signed   By: Esperanza Heir M.D.   On: 12/25/2013 16:55    Scheduled Meds: . cefTRIAXone (ROCEPHIN)  IV  1 g Intravenous Q24H  . cholecalciferol  3,000 Units Oral Daily  . digoxin  0.125 mg Oral Daily  . diltiazem  120 mg Oral Daily  . FLORA-Q  1 capsule Oral q morning - 10a  . sodium chloride  3 mL Intravenous Q12H   Continuous Infusions: . dextrose 5 % and 0.9% NaCl 50 mL/hr at 12/26/13 0206       Time spent: 35 minutes    [facility]  [facility] Pager [phone] If 7PM-7AM, please contact night-coverage at [email or web address], password [facility] 12/26/2013, 12:33 PM  LOS: 1 day

## 2013-12-26 NOTE — Progress Notes (Signed)
UR Completed Raybon Conard Graves-Bigelow, RN,BSN [phone]  

## 2013-12-26 NOTE — Care Management Note (Signed)
    Page 1 of 1   12/31/2013     3:28:37 PM   CARE MANAGEMENT NOTE 12/31/2013  Patient:  [name], [name]   Account Number:  [id number]  Date Initiated:  12/26/2013  Documentation initiated by:  GRAVES-BIGELOW,Johnnell Liou  Subjective/Objective Assessment:   Pt admitted for Encephalopathy. Pt is from home with wife.     Action/Plan:   CM will continue to monitor for disposiiton needs.   Anticipated DC Date:  12/28/2013   Anticipated DC Plan:  HOME W HOME HEALTH SERVICES      DC Planning Services  CM consult      Choice offered to / List presented to:             Status of service:  Completed, signed off Medicare Important Message given?   (If response is "NO", the following Medicare IM given date fields will be blank) Date Medicare IM given:   Date Additional Medicare IM given:    Discharge Disposition:  IP REHAB FACILITY  Per UR Regulation:  Reviewed for med. necessity/level of care/duration of stay  If discussed at Long Length of Stay Meetings, dates discussed:   01/01/2014    Comments:  12-31-12 1527 Patience Musca, BSN [phone] Pending insurance plan for CIR at d/c.

## 2013-12-27 ENCOUNTER — Inpatient Hospital Stay (HOSPITAL_COMMUNITY): Payer: Medicare Other

## 2013-12-27 DIAGNOSIS — R319 Hematuria, unspecified: Secondary | ICD-10-CM

## 2013-12-27 DIAGNOSIS — R4182 Altered mental status, unspecified: Secondary | ICD-10-CM

## 2013-12-27 DIAGNOSIS — J111 Influenza due to unidentified influenza virus with other respiratory manifestations: Principal | ICD-10-CM

## 2013-12-27 LAB — BASIC METABOLIC PANEL
BUN: 20 mg/dL (ref 6–23)
CO2: 24 mEq/L (ref 19–32)
Calcium: 7.9 mg/dL — ABNORMAL LOW (ref 8.4–10.5)
Chloride: 94 mEq/L — ABNORMAL LOW (ref 96–112)
Creatinine, Ser: 1.01 mg/dL (ref 0.50–1.35)
GFR calc Af Amer: 77 mL/min — ABNORMAL LOW (ref >90)
GFR calc non Af Amer: 66 mL/min — ABNORMAL LOW (ref >90)
Glucose, Bld: 104 mg/dL — ABNORMAL HIGH (ref 70–99)
Potassium: 4 mEq/L (ref 3.7–5.3)
Sodium: 130 mEq/L — ABNORMAL LOW (ref 137–147)

## 2013-12-27 LAB — CBC
HCT: 34.4 % — ABNORMAL LOW (ref 39.0–52.0)
Hemoglobin: 12 g/dL — ABNORMAL LOW (ref 13.0–17.0)
MCH: 33.6 pg (ref 26.0–34.0)
MCHC: 34.9 g/dL (ref 30.0–36.0)
MCV: 96.4 fL (ref 78.0–100.0)
Platelets: 132 10*3/uL — ABNORMAL LOW (ref 150–400)
RBC: 3.57 MIL/uL — ABNORMAL LOW (ref 4.22–5.81)
RDW: 14 % (ref 11.5–15.5)
WBC: 5.3 10*3/uL (ref 4.0–10.5)

## 2013-12-27 LAB — APTT: aPTT: 34 seconds (ref 24–37)

## 2013-12-27 LAB — HEPARIN LEVEL (UNFRACTIONATED)

## 2013-12-27 MED ORDER — DILTIAZEM HCL ER 180 MG PO CP24
180.0000 mg | ORAL_CAPSULE | Freq: Every day | ORAL | Status: DC
  Filled 2013-12-27: qty 1

## 2013-12-27 MED ORDER — HEPARIN (PORCINE) IN NACL 100-0.45 UNIT/ML-% IJ SOLN
1000.0000 [IU]/h | INTRAMUSCULAR | Status: DC
  Filled 2013-12-27: qty 250

## 2013-12-27 MED ORDER — METOPROLOL TARTRATE 1 MG/ML IV SOLN: 5.0000 mg | Freq: Four times a day (QID) | INTRAVENOUS | Status: DC

## 2013-12-27 MED ORDER — DILTIAZEM HCL 60 MG PO TABS
60.0000 mg | ORAL_TABLET | Freq: Once | ORAL | Status: DC
  Filled 2013-12-27: qty 1

## 2013-12-27 MED ORDER — STROKE: EARLY STAGES OF RECOVERY BOOK
Freq: Once | Status: AC
  Administered 2013-12-27: 18:00:00
  Filled 2013-12-27: qty 1

## 2013-12-27 MED ORDER — RIVAROXABAN 15 MG PO TABS
15.0000 mg | ORAL_TABLET | Freq: Every day | ORAL | Status: DC
  Filled 2013-12-27: qty 1

## 2013-12-27 MED ORDER — METOPROLOL TARTRATE 1 MG/ML IV SOLN
5.0000 mg | Freq: Four times a day (QID) | INTRAVENOUS | Status: DC
  Administered 2013-12-27 – 2013-12-28 (×5): 5 mg via INTRAVENOUS
  Filled 2013-12-27 (×10): qty 5

## 2013-12-27 MED ORDER — DILTIAZEM HCL 60 MG PO TABS: 60.0000 mg | ORAL_TABLET | Freq: Once | ORAL | Status: DC

## 2013-12-27 NOTE — Progress Notes (Addendum)
ANTICOAGULATION CONSULT NOTE - Initial Consult  Pharmacy Consult for heparin Indication: atrial fibrillation  Allergies  Allergen Reactions  . Diazepam     REACTION: agitation  . Ezetimibe-Simvastatin     unknown  . Morphine Other (See Comments)    Hallucinations.  . Sulfonamide Derivatives Hives    Patient Measurements: Height: 5\' 8"  (172.7 cm) Weight: 171 lb 6.4 oz (77.747 kg) IBW/kg (Calculated) : 68.4 Heparin Dosing Weight: 77 kg  Vital Signs: Temp: 99.6 F (37.6 C) (01/01 0433) Temp src: Oral (01/01 0433) BP: 137/79 mmHg (01/01 0930) Pulse Rate: 103 (01/01 0930)  Labs:  Recent Labs  12/25/13 1549 12/26/13 0440 12/27/13 0549  HGB 12.2* 11.5* 12.0*  HCT 35.4* 33.5* 34.4*  PLT 168 146* 132*  CREATININE 1.04 1.05 1.01    Estimated Creatinine Clearance: 52.7 ml/min (by C-G formula based on Cr of 1.01).   Medical History: Past Medical History  Diagnosis Date  . NEOPLASM, MALIGNANT, BLADDER 04/10/2009  . HYPERLIPIDEMIA 04/10/2009  . ANEMIA-NOS 04/10/2009  . 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 ANXIETY 04/10/2009  . HYPERTENSION 04/10/2009  . Atrial fibrillation 04/10/2009  . COPD 04/10/2009  . TRANSIENT ISCHEMIC ATTACK, HX OF 04/10/2009  . Bladder cancer     Dr. Lawerance Bach  . Lacunar stroke 04/07/2011  . Pleural effusion 04/07/2011  . 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 Stroke     Medications:  Prescriptions prior to admission  Medication Sig Dispense Refill  . Cholecalciferol (VITAMIN D) 2000 UNITS tablet Take 2,000-3,000 Units by mouth daily. Patient takes 2000 units daily, except for taking 3000 units (1.5 tablets) on "winter days" and days when "not feeling well."      . digoxin (LANOXIN) 0.125 MG tablet Take 1 tablet (0.125 mg total) by mouth daily.  30 tablet  5  . diltiazem (DILACOR XR) 120 MG 24 hr capsule Take 120 mg by mouth daily.      . Probiotic Product (PROBIOTIC DAILY PO) Take 1 tablet by mouth every morning.       . Rivaroxaban (XARELTO) 15  MG TABS tablet Take 1 tablet (15 mg total) by mouth daily.  30 tablet  12    Assessment: Mr. Cuffe is an 80 yo man on Xarelto 15 mg po daily for Afib.  It was held on admission due to hematuria.  Xarelto was ordered to be resumed today as urine more clear but he failed his swallow evaluation.  Pharmacy has been consulted to dose heparin for afib.     Wt 77.7kg, creat 1.01, Hg 12, pltc low at 132 ( 168 and 146 previously).  Goal of Therapy:  Heparin level 0.3-0.7 units/ml Monitor platelets by anticoagulation protocol: Yes   Plan:  1. Draw baseline heparin level and aPTT prior to starting heparin drip due to pt on xarelto PTA.   2. Start heparin drip with no bolus at 1000 units/hr and check 8 hr HL 3. Daily HL and CBC 4. F/u hematuria 5. Head CT ordered this am to r/o CVA, will wait until results are back before starting heparin.  May need to consult neuro about role of heparin if suspicion of stroke is high as heparin is contraindicated for acute stroke treatment.  Eudelia Bunch, Pharm.D. [phone] 12/27/2013 10:24 AM

## 2013-12-27 NOTE — Progress Notes (Signed)
Awaiting neuro eval before starting heparin drip for Afib Eudelia Bunch, Pharm.D. [phone] 12/27/2013 1:23 PM

## 2013-12-27 NOTE — Progress Notes (Signed)
Patient failed bedside swallow eval due to h/o dysphagia.  Dr. Eliseo Squires notified and because patient already on dys 3 diet, oral medications crushed attempted via applesauce, with Dr. Gaynelle Adu permission.  Patient having great difficulty swallowing by this method.  MD notified and NPO until speech evaluates.  Will continue to monitor. [place], Ardeth Sportsman

## 2013-12-27 NOTE — Evaluation (Signed)
Clinical/Bedside Swallow Evaluation Patient Details  Name: [name] MRN: [id number] Date of Birth: 1927-06-16  Today's Date: 12/27/2013 Time: 1110-1130 SLP Time Calculation (min): 20 min  Past Medical History:  Past Medical History  Diagnosis Date  . NEOPLASM, MALIGNANT, BLADDER 04/10/2009  . HYPERLIPIDEMIA 04/10/2009  . ANEMIA-NOS 04/10/2009  . 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 ANXIETY 04/10/2009  . HYPERTENSION 04/10/2009  . Atrial fibrillation 04/10/2009  . COPD 04/10/2009  . TRANSIENT ISCHEMIC ATTACK, HX OF 04/10/2009  . Bladder cancer     Dr. Lawerance Bach  . Lacunar stroke 04/07/2011  . Pleural effusion 04/07/2011  . 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 Stroke    Past Surgical History:  Past Surgical History  Procedure Laterality Date  . Splenectomy    . Bilateral vats ablation    . Facial cancer      facial skin cancer   HPI:  80 y.o. male with hx of bladder cancer, undergoing new treatment at outside facility, hx of afib on Xarelto (Dr Mare Ferrari), prior CVA, HTN, hyperlipidemia, s/p splenectomy.  Admitted with  acute encephalopathy, likely secondary to influenza and questionable UTI; SIRS, hematuria.  Has been on dysphagia 3 diet with crushed meds since admission - now with difficulty swallowing.     Assessment / Plan / Recommendation Clinical Impression  Pt presents with a likely transient dysphagia related to acute encephalopathy.  Inattention and confusion lead to impaired ability to initiate and sustain the motor sequence necessary for swallowing.  Pt holds POs orally, ceases mastication, eventually triggers a swallow response with intermittent s/s of aspiration with thin liquids.  Recommend downgrading diet to dysphagia 1, nectars for safety at this time.  Wife present - discussed status, precautions to follow when eating, the necessity of frequent cueing to help [name] attend to the swallowing process, and the necessity of holding POs if he is struggling.  Mrs.  Tugwell understands, agrees.  As cognition clears will likely see concomitant improvement in swallow.      Aspiration Risk  Moderate    Diet Recommendation Dysphagia 1 (Puree);Nectar-thick liquid   Liquid Administration via: Cup Medication Administration: Crushed with puree Supervision: Full supervision/cueing for compensatory strategies Postural Changes and/or Swallow Maneuvers: Seated upright 90 degrees    Other  Recommendations Oral Care Recommendations: Oral care BID Other Recommendations: Order thickener from pharmacy   Follow Up Recommendations   (tba)    Frequency and Duration min 2x/week  2 weeks   Pertinent Vitals/Pain No pain    SLP Swallow Goals   See care plan  Swallow Study Prior Functional Status       General Date of Onset: 12/25/13 HPI: 80 y.o. male with hx of bladder cancer, undergoing new treatment at outside facility, hx of afib on Xarelto (Dr Mare Ferrari), prior CVA, HTN, hyperlipidemia, s/p splenectomy.  Admitted with  acute encephalopathy, likely secondary to influenza and questionable UTI; SIRS, hematuria.  Has been on dysphagia 3 diet with crushed meds since admission - now with difficulty swallowing.   Type of Study: Bedside swallow evaluation Previous Swallow Assessment: 08/18/12 MBS Clinical impression: [name] presents with a mild oral dyspahgia with mild oral residual due to left labial and lingual weakness. More signficant oropharyngeal dysphagia with sensory/motor deficits leading to a delay in swallow initiaton and silent aspriaiton of thin liquids before the swallow. Furthermore weakness of base of tongue and hyolaryngeal complex results in mild to moderate residual that necesitates verbal cues for a second swallow to clear. Pt is a moderate aspiration  risk Diet Prior to this Study: Dysphagia 3 (soft) Temperature Spikes Noted: Yes Respiratory Status: Room air Behavior/Cognition: Alert;Confused Oral Cavity - Dentition: Adequate natural  dentition Self-Feeding Abilities: Needs assist Patient Positioning: Upright in bed Baseline Vocal Quality: Clear Volitional Cough: Strong;Congested Volitional Swallow: Unable to elicit    Oral/Motor/Sensory Function Overall Oral Motor/Sensory Function: Impaired at baseline (mild deficits left side baseline)   Ice Chips Ice chips: Not tested   Thin Liquid Thin Liquid: Impaired Presentation: Cup;Self Fed Oral Phase Impairments: Poor awareness of bolus;Impaired anterior to posterior transit Oral Phase Functional Implications: Oral holding;Prolonged oral transit Pharyngeal  Phase Impairments: Suspected delayed Swallow;Multiple swallows;Throat Clearing - Immediate;Cough - Delayed    Nectar Thick Nectar Thick Liquid: Impaired Oral Phase Impairments: Poor awareness of bolus Oral phase functional implications: Oral holding Pharyngeal Phase Impairments: Suspected delayed Swallow;Multiple swallows   Honey Thick Honey Thick Liquid: Not tested   Puree Puree: Impaired Presentation: Spoon Oral Phase Impairments: Impaired anterior to posterior transit;Reduced lingual movement/coordination;Poor awareness of bolus Oral Phase Functional Implications: Oral holding Pharyngeal Phase Impairments: Suspected delayed Swallow;Multiple swallows   Solid   Arian Mcquitty L. [place], [place] CCC/SLP Pager [phone]     Solid: Not tested       Juan Quam Laurice 12/27/2013,11:40 AM

## 2013-12-27 NOTE — Progress Notes (Signed)
Patient more dysarthric and having difficulty following commands.  Dr. Eliseo Squires notified.  Will continue to monitor. [place], Ardeth Sportsman

## 2013-12-27 NOTE — Progress Notes (Signed)
Came to evaluate the patient as he is more dysathric than this AM when I saw him in evaluation.  Called Neuro for eval.  Stat MRI.  NPO for now.  Defer anticoagulation to neuro.  Eulogio Bear DO

## 2013-12-27 NOTE — Consult Note (Addendum)
NEURO HOSPITALIST CONSULT NOTE    Reason for Consult: altered mental status, dysarthria.  HPI:                                                                                                                                          [name] is an 80 y.o. male with a past medical history significant for HTN, hyperlipidemia, atrial fibrillation ( off xarelto due to hematuria during this admission), right  lacunar stroke with residual mild left leg weakness, bladder cancer, immune thrombocytopenic purpura s/p splenectomy, admitted to [facility] due to confusion in the context of influenza and possible UTI. His wife is at the bedside and tells me that she is very concerned with the fact that her husband is "less responsive, no following commands, can not even feed himself, and is mumbling, having trouble finding words out, and I can not really understand what he is trying to say". Although he has been confused since admission, this morning he became less able to engage and participate in conversations. This change in his clinical status prompted a CT brain that revealed no acute abnormality. At this moment he is lethargic and not able to follow commands. Has been off Xarelto during this admission due to hematuria.    Past Medical History  Diagnosis Date  . NEOPLASM, MALIGNANT, BLADDER 04/10/2009  . HYPERLIPIDEMIA 04/10/2009  . ANEMIA-NOS 04/10/2009  . 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 ANXIETY 04/10/2009  . HYPERTENSION 04/10/2009  . Atrial fibrillation 04/10/2009  . COPD 04/10/2009  . TRANSIENT ISCHEMIC ATTACK, HX OF 04/10/2009  . Bladder cancer     Dr. Lawerance Bach  . Lacunar stroke 04/07/2011  . Pleural effusion 04/07/2011  . 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 Stroke     Past Surgical History  Procedure Laterality Date  . Splenectomy    . Bilateral vats ablation    . Facial cancer      facial skin cancer    Family History  Problem Relation Age of Onset  . Heart  disease Mother   . Arthritis Father     Social History:  reports that he has never smoked. He has never used smokeless tobacco. He reports that he does not drink alcohol. His drug history is not on file.  Allergies  Allergen Reactions  . Diazepam     REACTION: agitation  . Ezetimibe-Simvastatin     unknown  . Morphine Other (See Comments)    Hallucinations.  . Sulfonamide Derivatives Hives    MEDICATIONS:  I have reviewed the patient's current medications.   ROS: unable to obtain due to altered mental status.                                                                                                               History obtained from chart review and wife.    Physical exam: lethargic.Blood pressure 130/85, pulse 107, temperature 99.6 F (37.6 C), temperature source Oral, resp. rate 18, height $RemoveBe'5\' 8"'rlsOHAHYQ$  (1.727 m), weight 77.747 kg (171 lb 6.4 oz), SpO2 93.00%.  Head: normocephalic. Neck: supple, no bruits, no JVD. Cardiac: no murmurs. Lungs: clear. Abdomen: soft, no tender, no mass. Extremities: no edema.  Neurologic Examination:                                                                                                      Mental Status: Open eyes to verbal commands but for the most part he is lethargic and not able to follow commands. Speech appears to be dysarthric. Cranial Nerves: Pupils 2-3 mm bilaterally, reactive to light. No gaze preference. EOM full without nystagmus. Blinks to threat. Face symmetric. Tongue midline.  Motor: Moves all limbs symmetrically upon painful stimuli. Tone and bulk:normal tone throughout; no atrophy noted Sensory: withdraws to pain. Deep Tendon Reflexes:  1+ all over Plantars: Right: downgoing   Left: downgoing Cerebellar: Unable to test. Gait:  Unable to test. CV: pulses palpable throughout    Lab Results   Component Value Date/Time   CHOL 165 08/18/2012  3:50 AM    Results for orders placed during the hospital encounter of 12/25/13 (from the past 48 hour(s))  CBC WITH DIFFERENTIAL     Status: Abnormal   Collection Time    12/25/13  3:49 PM      Result Value Range   WBC 5.6  4.0 - 10.5 K/uL   RBC 3.64 (*) 4.22 - 5.81 MIL/uL   Hemoglobin 12.2 (*) 13.0 - 17.0 g/dL   HCT 35.4 (*) 39.0 - 52.0 %   MCV 97.3  78.0 - 100.0 fL   MCH 33.5  26.0 - 34.0 pg   MCHC 34.5  30.0 - 36.0 g/dL   RDW 14.2  11.5 - 15.5 %   Platelets 168  150 - 400 K/uL   Neutrophils Relative % 75  43 - 77 %   Neutro Abs 4.2  1.7 - 7.7 K/uL   Lymphocytes Relative 16  12 - 46 %   Lymphs Abs 0.9  0.7 - 4.0 K/uL   Monocytes Relative 9  3 - 12 %   Monocytes Absolute 0.5  0.1 - 1.0  K/uL   Eosinophils Relative 0  0 - 5 %   Eosinophils Absolute 0.0  0.0 - 0.7 K/uL   Basophils Relative 0  0 - 1 %   Basophils Absolute 0.0  0.0 - 0.1 K/uL  BASIC METABOLIC PANEL     Status: Abnormal   Collection Time    12/25/13  3:49 PM      Result Value Range   Sodium 134 (*) 137 - 147 mEq/L   Comment: Please note change in reference range.   Potassium 4.7  3.7 - 5.3 mEq/L   Comment: Please note change in reference range.   Chloride 95 (*) 96 - 112 mEq/L   CO2 28  19 - 32 mEq/L   Glucose, Bld 100 (*) 70 - 99 mg/dL   BUN 24 (*) 6 - 23 mg/dL   Creatinine, Ser 1.04  0.50 - 1.35 mg/dL   Calcium 8.7  8.4 - 10.5 mg/dL   GFR calc non Af Amer 64 (*) >90 mL/min   GFR calc Af Amer 74 (*) >90 mL/min   Comment: (NOTE)     The eGFR has been calculated using the CKD EPI equation.     This calculation has not been validated in all clinical situations.     eGFR's persistently <90 mL/min signify possible Chronic Kidney     Disease.  DIGOXIN LEVEL     Status: None   Collection Time    12/25/13  3:49 PM      Result Value Range   Digoxin Level 0.9  0.8 - 2.0 ng/mL  POCT I-STAT TROPONIN I     Status: None   Collection Time    12/25/13  3:57 PM       Result Value Range   Troponin i, poc 0.02  0.00 - 0.08 ng/mL   Comment 3            Comment: Due to the release kinetics of cTnI,     a negative result within the first hours     of the onset of symptoms does not rule out     myocardial infarction with certainty.     If myocardial infarction is still suspected,     repeat the test at appropriate intervals.  CG4 I-STAT (LACTIC ACID)     Status: None   Collection Time    12/25/13  3:59 PM      Result Value Range   Lactic Acid, Venous 0.76  0.5 - 2.2 mmol/L  INFLUENZA PANEL BY PCR     Status: Abnormal   Collection Time    12/26/13  1:27 AM      Result Value Range   Influenza A By PCR POSITIVE (*) NEGATIVE   Influenza B By PCR NEGATIVE  NEGATIVE   H1N1 flu by pcr NOT DETECTED  NOT DETECTED   Comment:            The Xpert Flu assay (FDA approved for     nasal aspirates or washes and     nasopharyngeal swab specimens), is     intended as an aid in the diagnosis of     influenza and should not be used as     a sole basis for treatment.  BASIC METABOLIC PANEL     Status: Abnormal   Collection Time    12/26/13  4:40 AM      Result Value Range   Sodium 138  137 - 147 mEq/L   Comment: Please note  change in reference range.   Potassium 4.4  3.7 - 5.3 mEq/L   Comment: Please note change in reference range.   Chloride 98  96 - 112 mEq/L   CO2 29  19 - 32 mEq/L   Glucose, Bld 96  70 - 99 mg/dL   BUN 21  6 - 23 mg/dL   Creatinine, Ser 3.74  0.50 - 1.35 mg/dL   Calcium 8.6  8.4 - 83.6 mg/dL   GFR calc non Af Amer 63 (*) >90 mL/min   GFR calc Af Amer 73 (*) >90 mL/min   Comment: (NOTE)     The eGFR has been calculated using the CKD EPI equation.     This calculation has not been validated in all clinical situations.     eGFR's persistently <90 mL/min signify possible Chronic Kidney     Disease.  CBC     Status: Abnormal   Collection Time    12/26/13  4:40 AM      Result Value Range   WBC 4.7  4.0 - 10.5 K/uL   RBC 3.44 (*) 4.22 -  5.81 MIL/uL   Hemoglobin 11.5 (*) 13.0 - 17.0 g/dL   HCT 41.5 (*) 55.7 - 77.0 %   MCV 97.4  78.0 - 100.0 fL   MCH 33.4  26.0 - 34.0 pg   MCHC 34.3  30.0 - 36.0 g/dL   RDW 17.9  71.2 - 90.8 %   Platelets 146 (*) 150 - 400 K/uL  URINALYSIS, ROUTINE W REFLEX MICROSCOPIC     Status: Abnormal   Collection Time    12/26/13  6:55 PM      Result Value Range   Color, Urine YELLOW  YELLOW   APPearance HAZY (*) CLEAR   Specific Gravity, Urine 1.017  1.005 - 1.030   pH 7.0  5.0 - 8.0   Glucose, UA NEGATIVE  NEGATIVE mg/dL   Hgb urine dipstick LARGE (*) NEGATIVE   Bilirubin Urine NEGATIVE  NEGATIVE   Ketones, ur NEGATIVE  NEGATIVE mg/dL   Protein, ur 30 (*) NEGATIVE mg/dL   Urobilinogen, UA 0.2  0.0 - 1.0 mg/dL   Nitrite NEGATIVE  NEGATIVE   Leukocytes, UA NEGATIVE  NEGATIVE  URINE MICROSCOPIC-ADD ON     Status: None   Collection Time    12/26/13  6:55 PM      Result Value Range   Squamous Epithelial / LPF RARE  RARE   WBC, UA 3-6  <3 WBC/hpf   RBC / HPF 21-50  <3 RBC/hpf   Bacteria, UA RARE  RARE  BASIC METABOLIC PANEL     Status: Abnormal   Collection Time    12/27/13  5:49 AM      Result Value Range   Sodium 130 (*) 137 - 147 mEq/L   Comment: Please note change in reference range.   Potassium 4.0  3.7 - 5.3 mEq/L   Comment: Please note change in reference range.   Chloride 94 (*) 96 - 112 mEq/L   CO2 24  19 - 32 mEq/L   Glucose, Bld 104 (*) 70 - 99 mg/dL   BUN 20  6 - 23 mg/dL   Creatinine, Ser 9.64  0.50 - 1.35 mg/dL   Calcium 7.9 (*) 8.4 - 10.5 mg/dL   GFR calc non Af Amer 66 (*) >90 mL/min   GFR calc Af Amer 77 (*) >90 mL/min   Comment: (NOTE)     The eGFR has been calculated using the  CKD EPI equation.     This calculation has not been validated in all clinical situations.     eGFR's persistently <90 mL/min signify possible Chronic Kidney     Disease.  CBC     Status: Abnormal   Collection Time    12/27/13  5:49 AM      Result Value Range   WBC 5.3  4.0 - 10.5 K/uL    RBC 3.57 (*) 4.22 - 5.81 MIL/uL   Hemoglobin 12.0 (*) 13.0 - 17.0 g/dL   HCT 34.4 (*) 39.0 - 52.0 %   MCV 96.4  78.0 - 100.0 fL   MCH 33.6  26.0 - 34.0 pg   MCHC 34.9  30.0 - 36.0 g/dL   RDW 14.0  11.5 - 15.5 %   Platelets 132 (*) 150 - 400 K/uL  HEPARIN LEVEL (UNFRACTIONATED)     Status: Abnormal   Collection Time    12/27/13 11:45 AM      Result Value Range   Heparin Unfractionated <0.10 (*) 0.30 - 0.70 IU/mL   Comment:            IF HEPARIN RESULTS ARE BELOW     EXPECTED VALUES, AND PATIENT     DOSAGE HAS BEEN CONFIRMED,     SUGGEST FOLLOW UP TESTING     OF ANTITHROMBIN III LEVELS.     REPEATED TO VERIFY  APTT     Status: None   Collection Time    12/27/13 11:45 AM      Result Value Range   aPTT 34  24 - 37 seconds    Dg Chest 2 View  12/25/2013   CLINICAL DATA:  Weakness  EXAM: CHEST  2 VIEW  COMPARISON:  08/21/2012  FINDINGS: Heart size and mediastinal contours are normal. The vascular pattern is normal. Bilateral pleural apical thickening is stable. There is no consolidation or effusion. Chronic left rib deformities due to prior fractures, stable.  IMPRESSION: No active cardiopulmonary disease.   Electronically Signed   By: Skipper Cliche M.D.   On: 12/25/2013 16:55   Ct Head Wo Contrast  12/27/2013   CLINICAL DATA:  Patient having trouble finding words.  EXAM: CT HEAD WITHOUT CONTRAST  TECHNIQUE: Contiguous axial images were obtained from the base of the skull through the vertex without intravenous contrast.  COMPARISON:  08/20/2012  FINDINGS: There is no evidence of mass effect, midline shift, or extra-axial fluid collections. There is no evidence of a space-occupying lesion or intracranial hemorrhage. There is no evidence of a cortical-based area of acute infarction. There is an old right corona radiata infarct. There is an old left basal ganglia lacunar infarct. . There is generalized cerebral atrophy. There is periventricular white matter low attenuation likely secondary  to microangiopathy.  The ventricles and sulci are appropriate for the patient's age. The basal cisterns are patent.  Visualized portions of the orbits are unremarkable. The visualized portions of the paranasal sinuses and mastoid air cells are unremarkable.  The osseous structures are unremarkable.  IMPRESSION: No acute intracranial pathology.   Electronically Signed   By: Kathreen Devoid   On: 12/27/2013 11:14   Assessment/Plan: 80 y/o with altered mental status, dysarthria, and dysphagia. Multiple risk factors for stroke including atrial fibrillation currently off xarelto due to recent hematuria. Shower of emboli versus encephalopathy. Recommend: 1) MRI-DWI brain. 2) Will not recommend heparin and will make a decision regarding when to resume xarelto depending on the  MRI results/ size of the stroke  3) Will follow up.   Dorian Pod, MD 12/27/2013, 2:21 PM

## 2013-12-27 NOTE — Progress Notes (Addendum)
[facility] PROGRESS NOTE  [name] DGL:[id number] DOB: 06-Jun-1927 DOA: 12/25/2013 PCP: Cathlean Cower, MD  HPI/Subjective: Wife at bedside, feeding him- worried about a CVA- patient having trouble finding words- was semi- independent before admission   Assessment/Plan: Principal Problem:   Encephalopathy Active Problems:   HYPERTENSION   Lacunar stroke   Post-splenectomy   Long term current use of anticoagulant   UTI (lower urinary tract infection)   Atrial fibrillation   Influenza with URT manifestations -Tested positive for type A influenza, started on Tamiflu x 5 days.  Acute encephalopathy -Likely secondary to influenza and questionable UTI -Patient has dysuria, hematuria fever but urinalysis has no pus cells. - IV Rocephin- no culture done- futile as on abx now -check head CT as h/o CVA and similar presentation  Dysphagia -ask SLP to see -change as much to IV as possible  SIRS -With fever and heart rate of 103 this is considered to SIRS. -Started on antibiotics empirically, patient is on IV Rocephin. -If patient spikes fever again, will consider to switch antibiotics to Zosyn and vancomycin.  Atrial fibrillation -resume xarelto -increase dose of Cardizem for AM with one time done now  Hematuria -Repeat urinalysis as his urine is more clear today, likely can restart Xarelto in AM.  Code Status: Full code Family Communication: Plan discussed with the patient/wife Disposition Plan: PT/OT eval   Consultants:  None  Procedures:  None  Antibiotics:  None   Objective: Filed Vitals:   12/27/13 0433  BP: 136/89  Pulse: 115  Temp: 99.6 F (37.6 C)  Resp: 18    Intake/Output Summary (Last 24 hours) at 12/27/13 0902 Last data filed at 12/26/13 1500  Gross per 24 hour  Intake      0 ml  Output    450 ml  Net   -450 ml   Filed Weights   12/25/13 2335  Weight: 77.747 kg (171 lb 6.4 oz)    Exam: General: Alert and awake, oriented x3,  not in any acute distress. HEENT: anicteric sclera, pupils reactive to light and accommodation, EOMI CVS: S1-S2 clear, no murmur rubs or gallops Chest: clear to auscultation bilaterally, no wheezing, rales or rhonchi Abdomen: soft nontender, nondistended, normal bowel sounds, no organomegaly Extremities: no cyanosis, clubbing or edema noted bilaterally Neuro: Cranial nerves II-XII intact, no focal neurological deficits  Data Reviewed: Basic Metabolic Panel:  Recent Labs Lab 12/25/13 1549 12/26/13 0440 12/27/13 0549  NA 134* 138 130*  K 4.7 4.4 4.0  CL 95* 98 94*  CO2 28 29 24   GLUCOSE 100* 96 104*  BUN 24* 21 20  CREATININE 1.04 1.05 1.01  CALCIUM 8.7 8.6 7.9*   Liver Function Tests: No results found for this basename: AST, ALT, ALKPHOS, BILITOT, PROT, ALBUMIN,  in the last 168 hours No results found for this basename: LIPASE, AMYLASE,  in the last 168 hours No results found for this basename: AMMONIA,  in the last 168 hours CBC:  Recent Labs Lab 12/25/13 1549 12/26/13 0440 12/27/13 0549  WBC 5.6 4.7 5.3  NEUTROABS 4.2  --   --   HGB 12.2* 11.5* 12.0*  HCT 35.4* 33.5* 34.4*  MCV 97.3 97.4 96.4  PLT 168 146* 132*   Cardiac Enzymes: No results found for this basename: CKTOTAL, CKMB, CKMBINDEX, TROPONINI,  in the last 168 hours BNP (last 3 results) No results found for this basename: PROBNP,  in the last 8760 hours CBG: No results found for this basename: GLUCAP,  in the  last 168 hours  Micro No results found for this or any previous visit (from the past 240 hour(s)).   Studies: Dg Chest 2 View  12/25/2013   CLINICAL DATA:  Weakness  EXAM: CHEST  2 VIEW  COMPARISON:  08/21/2012  FINDINGS: Heart size and mediastinal contours are normal. The vascular pattern is normal. Bilateral pleural apical thickening is stable. There is no consolidation or effusion. Chronic left rib deformities due to prior fractures, stable.  IMPRESSION: No active cardiopulmonary disease.    Electronically Signed   By: Skipper Cliche M.D.   On: 12/25/2013 16:55    Scheduled Meds: . cholecalciferol  3,000 Units Oral Daily  . digoxin  0.125 mg Oral Daily  . diltiazem  60 mg Oral Once  . [START ON 12/28/2013] diltiazem  180 mg Oral Daily  . FLORA-Q  1 capsule Oral q morning - 10a  . levofloxacin  500 mg Oral Daily  . oseltamivir  75 mg Oral BID  . Rivaroxaban  15 mg Oral Daily  . sodium chloride  3 mL Intravenous Q12H   Continuous Infusions: . dextrose 5 % and 0.9% NaCl 50 mL/hr at 12/26/13 2219       Time spent: 35 minutes    [name]  [facility] Pager [phone] If 7PM-7AM, please contact night-coverage at [email or web address], password [facility] 12/27/2013, 9:02 AM  LOS: 2 days

## 2013-12-27 NOTE — Progress Notes (Signed)
Pt. Sustained HR in 120s, nonsustained in 140s; asymptomatic and afebrile.  MD notified.  Received order to move 10:00am dose of diltiazem to now.  Will continue to monitor.

## 2013-12-28 DIAGNOSIS — I69991 Dysphagia following unspecified cerebrovascular disease: Secondary | ICD-10-CM

## 2013-12-28 DIAGNOSIS — I6330 Cerebral infarction due to thrombosis of unspecified cerebral artery: Secondary | ICD-10-CM

## 2013-12-28 DIAGNOSIS — I6350 Cerebral infarction due to unspecified occlusion or stenosis of unspecified cerebral artery: Secondary | ICD-10-CM

## 2013-12-28 LAB — CBC
HCT: 37.1 % — ABNORMAL LOW (ref 39.0–52.0)
Hemoglobin: 13.3 g/dL (ref 13.0–17.0)
MCH: 34 pg (ref 26.0–34.0)
MCHC: 35.8 g/dL (ref 30.0–36.0)
MCV: 94.9 fL (ref 78.0–100.0)
Platelets: 145 10*3/uL — ABNORMAL LOW (ref 150–400)
RBC: 3.91 MIL/uL — ABNORMAL LOW (ref 4.22–5.81)
RDW: 14 % (ref 11.5–15.5)
WBC: 11.4 10*3/uL — ABNORMAL HIGH (ref 4.0–10.5)

## 2013-12-28 LAB — BASIC METABOLIC PANEL
BUN: 25 mg/dL — AB (ref 6–23)
CO2: 26 mEq/L (ref 19–32)
CREATININE: 1.02 mg/dL (ref 0.50–1.35)
Calcium: 8.6 mg/dL (ref 8.4–10.5)
Chloride: 91 mEq/L — ABNORMAL LOW (ref 96–112)
GFR, EST AFRICAN AMERICAN: 76 mL/min — AB (ref >90)
GFR, EST NON AFRICAN AMERICAN: 65 mL/min — AB (ref >90)
Glucose, Bld: 103 mg/dL — ABNORMAL HIGH (ref 70–99)
POTASSIUM: 4.2 meq/L (ref 3.7–5.3)
Sodium: 132 mEq/L — ABNORMAL LOW (ref 137–147)

## 2013-12-28 LAB — APTT: APTT: 34 s (ref 24–37)

## 2013-12-28 LAB — HEPARIN LEVEL (UNFRACTIONATED): Heparin Unfractionated: <0.1 IU/mL — ABNORMAL LOW (ref 0.30–0.70)

## 2013-12-28 MED ORDER — METOPROLOL TARTRATE 1 MG/ML IV SOLN: 5.0000 mg | Freq: Four times a day (QID) | INTRAVENOUS | Status: DC | PRN

## 2013-12-28 MED ORDER — RESOURCE THICKENUP CLEAR PO POWD
ORAL | Status: DC | PRN
  Filled 2013-12-28: qty 125

## 2013-12-28 MED ORDER — DILTIAZEM HCL 30 MG PO TABS
30.0000 mg | ORAL_TABLET | Freq: Four times a day (QID) | ORAL | Status: DC
  Administered 2013-12-28 – 2013-12-31 (×12): 30 mg via ORAL
  Filled 2013-12-28 (×17): qty 1

## 2013-12-28 MED ORDER — RIVAROXABAN 15 MG PO TABS
15.0000 mg | ORAL_TABLET | Freq: Every day | ORAL | Status: DC
  Administered 2013-12-28 – 2013-12-31 (×4): 15 mg via ORAL
  Filled 2013-12-28 (×5): qty 1

## 2013-12-28 MED ORDER — ENOXAPARIN SODIUM 40 MG/0.4ML ~~LOC~~ SOLN
40.0000 mg | SUBCUTANEOUS | Status: DC
Start: 2013-12-28 — End: 2013-12-28
  Filled 2013-12-28: qty 0.4

## 2013-12-28 MED ORDER — SODIUM CHLORIDE 0.9 % IV SOLN
INTRAVENOUS | Status: DC
  Administered 2013-12-28: 15:00:00 via INTRAVENOUS

## 2013-12-28 NOTE — Consult Note (Signed)
Physical Medicine and Rehabilitation Consult Reason for Consult: CVA  Referring Physician: [facility]   HPI: [name] is a 80 y.o. right handed and male with history of bladder cancer undergoing the treatment outside facility, history of atrial fibrillation on Xarelto, prior CVA August of 2013 and received inpatient rehabilitation services. Patient independent prior to admission needing some simple set up things. Admitted 12/25/2013 with altered mental status and low grade fever of 100 Fahrenheit and reported hematuria. Cranial CT scan negative. MRI of the brain showed punctate acute infarct in the high left parietal lobe as well as extensive chronic ischemic changes. Patient did not receive TPA. Patient's Xarelto held for a short time secondary to hematuria and latest urinalysis study unremarkable. Patient tested positive for type A influenza and placed on Tamiflu x5 days and drop of precautions. Dysphagia 1 nectar thick liquid diet. Physical therapy evaluation completed 12/28/2013 with recommendations of physical medicine rehabilitation consult to consider inpatient rehabilitation services  Patient still has some bladder pain. Has had constipation although today has had a couple small stools  Review of Systems  Respiratory: Positive for shortness of breath.   Cardiovascular: Positive for palpitations.  Neurological: Positive for weakness.  Psychiatric/Behavioral:       Anxiety  All other systems reviewed and are negative.   Past Medical History  Diagnosis Date  . NEOPLASM, MALIGNANT, BLADDER 04/10/2009  . HYPERLIPIDEMIA 04/10/2009  . ANEMIA-NOS 04/10/2009  . 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 ANXIETY 04/10/2009  . HYPERTENSION 04/10/2009  . Atrial fibrillation 04/10/2009  . COPD 04/10/2009  . TRANSIENT ISCHEMIC ATTACK, HX OF 04/10/2009  . Bladder cancer     Dr. Lawerance Bach  . Lacunar stroke 04/07/2011  . Pleural effusion 04/07/2011  . 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 Stroke    Past  Surgical History  Procedure Laterality Date  . Splenectomy    . Bilateral vats ablation    . Facial cancer      facial skin cancer   Family History  Problem Relation Age of Onset  . Heart disease Mother   . Arthritis Father    Social History:  reports that he has never smoked. He has never used smokeless tobacco. He reports that he does not drink alcohol. His drug history is not on file. Allergies:  Allergies  Allergen Reactions  . Diazepam     REACTION: agitation  . Ezetimibe-Simvastatin     unknown  . Morphine Other (See Comments)    Hallucinations.  . Sulfonamide Derivatives Hives   Medications Prior to Admission  Medication Sig Dispense Refill  . Cholecalciferol (VITAMIN D) 2000 UNITS tablet Take 2,000-3,000 Units by mouth daily. Patient takes 2000 units daily, except for taking 3000 units (1.5 tablets) on "winter days" and days when "not feeling well."      . digoxin (LANOXIN) 0.125 MG tablet Take 1 tablet (0.125 mg total) by mouth daily.  30 tablet  5  . diltiazem (DILACOR XR) 120 MG 24 hr capsule Take 120 mg by mouth daily.      . Probiotic Product (PROBIOTIC DAILY PO) Take 1 tablet by mouth every morning.       . Rivaroxaban (XARELTO) 15 MG TABS tablet Take 1 tablet (15 mg total) by mouth daily.  30 tablet  12    Home: Home Living Family/patient expects to be discharged to:: Inpatient rehab Living Arrangements: Spouse/significant other Type of Home: House Home Access: Stairs to enter [employer] of Steps: 4 Entrance Stairs-Rails: Right Home Layout: One level Home  Equipment: Shower seat;Hand held shower head Additional Comments: tub shower.   Functional History: Prior Function Comments: wife assists with setting out clothes. Pt has bladder incontinence (Wife would help him set up things, but he could perform most) Functional Status:  Mobility: Bed Mobility Bed Mobility: Supine to Sit Supine to Sit: 1: +2 Total assist Supine to Sit: Patient  Percentage: 40% Transfers Transfers: Stand Pivot Transfers;Sit to Stand;Stand to Sit Sit to Stand: 1: +2 Total assist Sit to Stand: Patient Percentage: 70% Stand to Sit: 1: +2 Total assist Stand to Sit: Patient Percentage: 80% Stand Pivot Transfers: 1: +2 Total assist Stand Pivot Transfers: Patient Percentage: 60% Ambulation/Gait Ambulation/Gait Assistance: Not tested (comment)    ADL: ADL Eating/Feeding: Supervision/safety;Other (comment) (nectar thick)  Cognition: Cognition Overall Cognitive Status: Impaired/Different from baseline Orientation Level: Oriented to person;Oriented to place Cognition Arousal/Alertness: Lethargic Behavior During Therapy: Flat affect Overall Cognitive Status: Impaired/Different from baseline Area of Impairment: Attention;Memory;Orientation;Following commands;Safety/judgement;Problem solving;Awareness Orientation Level: Disoriented to;Situation Current Attention Level: Focused Memory: Decreased short-term memory;Decreased recall of precautions Following Commands: Follows one step commands consistently Safety/Judgement: Decreased awareness of safety;Decreased awareness of deficits  Blood pressure 105/57, pulse 126, temperature 97.8 F (36.6 C), temperature source Axillary, resp. rate 22, height 5\' 8"  (1.727 m), weight 77.747 kg (171 lb 6.4 oz), SpO2 98.00%. Physical Exam  Vitals reviewed. HENT:  Head: Normocephalic.  Eyes: EOM are normal.  Neck: Normal range of motion. Neck supple. No thyromegaly present.  Cardiovascular:  Cardiac rate controlled  Respiratory: Effort normal and breath sounds normal. No respiratory distress.  GI: Soft. Bowel sounds are normal. He exhibits no distension.  Neurological: He is alert.  Mood is flat and somewhat slow to process. He had limited recall of recent events. He was able to state his name, age. Follows simple commands  Skin: Skin is warm and dry.   4/5 bilateral deltoid, bicep, tricep, grip 4/5 in the  right hip flexor knee extensor ankle dorsiflexor plantar flexion 3 minus/5 in the left hip flexor knee extensor ankle dorsiflexor plantar flexor Sensation difficult to assess secondary to poor concentration and attention. Light touch was intact right foot, left foot difficult to evaluate as patient lost focus  Results for orders placed during the hospital encounter of 12/25/13 (from the past 24 hour(s))  HEPARIN LEVEL (UNFRACTIONATED)     Status: Abnormal   Collection Time    12/28/13  7:02 AM      Result Value Range   Heparin Unfractionated <0.10 (*) 0.30 - 0.70 IU/mL  APTT     Status: None   Collection Time    12/28/13  7:02 AM      Result Value Range   aPTT 34  24 - 37 seconds  CBC     Status: Abnormal   Collection Time    12/28/13  7:02 AM      Result Value Range   WBC 11.4 (*) 4.0 - 10.5 K/uL   RBC 3.91 (*) 4.22 - 5.81 MIL/uL   Hemoglobin 13.3  13.0 - 17.0 g/dL   HCT 37.1 (*) 39.0 - 52.0 %   MCV 94.9  78.0 - 100.0 fL   MCH 34.0  26.0 - 34.0 pg   MCHC 35.8  30.0 - 36.0 g/dL   RDW 14.0  11.5 - 15.5 %   Platelets 145 (*) 150 - 400 K/uL  BASIC METABOLIC PANEL     Status: Abnormal   Collection Time    12/28/13  7:02 AM  Result Value Range   Sodium 132 (*) 137 - 147 mEq/L   Potassium 4.2  3.7 - 5.3 mEq/L   Chloride 91 (*) 96 - 112 mEq/L   CO2 26  19 - 32 mEq/L   Glucose, Bld 103 (*) 70 - 99 mg/dL   BUN 25 (*) 6 - 23 mg/dL   Creatinine, Ser 1.02  0.50 - 1.35 mg/dL   Calcium 8.6  8.4 - 10.5 mg/dL   GFR calc non Af Amer 65 (*) >90 mL/min   GFR calc Af Amer 76 (*) >90 mL/min   Ct Head Wo Contrast  12/27/2013   CLINICAL DATA:  Patient having trouble finding words.  EXAM: CT HEAD WITHOUT CONTRAST  TECHNIQUE: Contiguous axial images were obtained from the base of the skull through the vertex without intravenous contrast.  COMPARISON:  08/20/2012  FINDINGS: There is no evidence of mass effect, midline shift, or extra-axial fluid collections. There is no evidence of a  space-occupying lesion or intracranial hemorrhage. There is no evidence of a cortical-based area of acute infarction. There is an old right corona radiata infarct. There is an old left basal ganglia lacunar infarct. . There is generalized cerebral atrophy. There is periventricular white matter low attenuation likely secondary to microangiopathy.  The ventricles and sulci are appropriate for the patient's age. The basal cisterns are patent.  Visualized portions of the orbits are unremarkable. The visualized portions of the paranasal sinuses and mastoid air cells are unremarkable.  The osseous structures are unremarkable.  IMPRESSION: No acute intracranial pathology.   Electronically Signed   By: Kathreen Devoid   On: 12/27/2013 11:14   Mr Brain Wo Contrast  12/27/2013   CLINICAL DATA:  Stroke.  Altered mental status.  Speech disturbance.  EXAM: MRI HEAD WITHOUT CONTRAST  TECHNIQUE: Multiplanar, multiecho pulse sequences of the brain and surrounding structures were obtained without intravenous contrast.  COMPARISON:  Head CT same day .  FINDINGS: Diffusion imaging shows a punctate acute infarction in the left parietal lobe near the vertex. No other acute insult. There chronic small-vessel changes affecting the pons. There are old small vessel cerebellar infarctions. There are numerous old lacunar infarctions affecting the basal ganglia, thalami and hemispheric deep white matter. There is widespread chronic small vessel disease throughout the white matter of the cerebral hemispheres. No mass lesion, acute hemorrhage, hydrocephalus or extra-axial collection. No pituitary mass. No inflammatory sinus disease. There is a small amount of fluid in the mastoid air cells on the right. Major vessels at the base of the brain show flow.  IMPRESSION: Punctate acute infarction in the high left parietal lobe.  Extensive chronic ischemic changes elsewhere throughout the brain as outlined above.   Electronically Signed   By: Nelson Chimes M.D.   On: 12/27/2013 17:01    Assessment/Plan: Diagnosis: Acute left parietal infarct with decreased balance, possible aphasia as well as decreased cognition. Also has chronic left lower extremity weakness from right corona radiata infarct 2013 1. Does the need for close, 24 hr/day medical supervision in concert with the patient's rehab needs make it unreasonable for this patient to be served in a less intensive setting? Yes 2. Co-Morbidities requiring supervision/potential complications: UTI, history of bladder cancer, atrial fibrillation with RVR, COPD, encephalopathy 3. Due to bladder management, bowel management, safety, skin/wound care, disease management, medication administration, pain management and patient education, does the patient require 24 hr/day rehab nursing? Yes 4. Does the patient require coordinated care of a physician, rehab nurse,  PT (1-2 hrs/day, 5 days/week), OT (1-2 hrs/day, 5 days/week) and SLP (0.5-1 hrs/day, 5 days/week) to address physical and functional deficits in the context of the above medical diagnosis(es)? Yes Addressing deficits in the following areas: balance, endurance, locomotion, strength, transferring, bowel/bladder control, bathing, dressing, feeding, grooming, toileting, cognition, speech and language 5. Can the patient actively participate in an intensive therapy program of at least 3 hrs of therapy per day at least 5 days per week? Potentially 6. The potential for patient to make measurable gains while on inpatient rehab is good 7. Anticipated functional outcomes upon discharge from inpatient rehab are supervision mobility with PT, supervision ADLs with OT, 100% orientation with SLP. 8. Estimated rehab length of stay to reach the above functional goals is: 2 weeks 9. Does the patient have adequate social supports to accommodate these discharge functional goals? Yes 10. Anticipated D/C setting: Home 11. Anticipated post D/C treatments: [facility]  therapy 12. Overall Rehab/Functional Prognosis: good  RECOMMENDATIONS: This patient's condition is appropriate for continued rehabilitative care in the following setting: CIR once activity tolerance and endurance improve Patient has agreed to participate in recommended program. Potentially Note that insurance prior authorization may be required for reimbursement for recommended care.  Comment: Has just started sitting up and tolerating therapy. Should be ready for CIR in 2-3 days    12/28/2013

## 2013-12-28 NOTE — Progress Notes (Signed)
Occupational Therapy Treatment Patient Details Name: [name] MRN: [id number] DOB: 1927/03/11 Today's Date: 12/28/2013 Time: 1301-1330 OT Time Calculation (min): 29 min  OT Assessment / Plan / Recommendation  History of present illness [name] is an 80 y.o. male with a past medical history significant for HTN, hyperlipidemia, atrial fibrillation ( off xarelto due to hematuria during this admission), right lacunar stroke with residual mild left leg weakness, bladder cancer, immune thrombocytopenic purpura s/p splenectomy.  Admitted to [facility] due to confusion in the context of influenza and possible UTI.  CT negative for CVA, MRI report reveals L parietal infarct with chronic ischemic disease in basal ganglia.   OT comments  Seen again this pm to educate wife on best way to work with her husband on self feeding and basic ADL due to apparent cognitive deficits.no apparent distress Wife verbalized understanding of need to give simple indstructions, limit distractions, vc to initiate and task termination.  Follow Up Recommendations  CIR    Barriers to Discharge       Equipment Recommendations  None recommended by OT    Recommendations for Other Services Rehab consult  Frequency Min 3X/week   Progress towards OT Goals Progress towards OT goals: Progressing toward goals  Plan Discharge plan remains appropriate    Precautions / Restrictions Precautions Precautions: Fall Restrictions Weight Bearing Restrictions: No   Pertinent Vitals/Pain no apparent distress     ADL  Eating/Feeding: Minimal assistance;Other (comment) (mod vc and min tactile cues) Where Assessed - Eating/Feeding: Chair Grooming: Minimal assistance;Teeth care;Other (comment) (difficulty with sequencing and initiation) Where Assessed - Grooming: Supported sitting Upper Body Bathing: Moderate assistance Where Assessed - Upper Body Bathing: Supported sitting Lower Body Bathing: Maximal assistance Where Assessed -  Lower Body Bathing: Supported sit to stand Upper Body Dressing: Maximal assistance Where Assessed - Upper Body Dressing: Supported sitting Lower Body Dressing: Maximal assistance Where Assessed - Lower Body Dressing: Supported sit to Lobbyist: Maximal assistance Toilet Transfer Method: Sit to stand Toileting - Water quality scientist and Hygiene: Maximal assistance Where Assessed - Best boy and Hygiene: Sit to stand from 3-in-1 or toilet Equipment Used: Gait belt Transfers/Ambulation Related to ADLs: Max A transfers. Did not ambulate ADL Comments: educated wife on techniques to increase pt's independence with self feeding. Discussed importance of limiting distractions and only giving pt 1 step commands with time to process. PT needs occasional tactile cues to pick up spoon and initiate bite. Vc required after drinking liquid to swallow (Pt brushed teeth after lunch. Again, wife instructed on importance of limiting distractions and giving simple commands.  Pt with mod difficulty sequencing tasks, and apparent difficulty with initiation and task termination. )    OT Diagnosis: Generalized weakness;Cognitive deficits  OT Problem List: Decreased strength;Decreased range of motion;Decreased activity tolerance;Impaired balance (sitting and/or standing);Decreased coordination;Decreased cognition;Decreased safety awareness;Decreased knowledge of use of DME or AE;Decreased knowledge of precautions OT Treatment Interventions: Self-care/ADL training;Therapeutic exercise;Energy conservation;DME and/or AE instruction;Therapeutic activities;Cognitive remediation/compensation;Patient/family education;Balance training   OT Goals(current goals can now be found in the care plan section) Acute Rehab OT Goals Patient Stated Goal: get better OT Goal Formulation: With patient/family Time For Goal Achievement: 01/11/14 Potential to Achieve Goals: Good  Visit Information  Last OT  Received On: 12/28/13 Assistance Needed: +2 History of Present Illness: [name] is an 80 y.o. male with a past medical history significant for HTN, hyperlipidemia, atrial fibrillation ( off xarelto due to hematuria during this admission), right lacunar stroke with residual mild  left leg weakness, bladder cancer, immune thrombocytopenic purpura s/p splenectomy.  Admitted to [facility] due to confusion in the context of influenza and possible UTI.  CT negative for CVA, MRI report reveals L parietal infarct with chronic ischemic disease in basal ganglia.    Subjective Data      Prior Functioning  Home Living Family/patient expects to be discharged to:: Inpatient rehab Living Arrangements: Spouse/significant other Type of Home: House Home Access: Stairs to enter Entrance Stairs-Number of Steps: 4 Entrance Stairs-Rails: Right Home Layout: One level Home Equipment: Shower seat;Hand held shower head Additional Comments: tub shower.  Prior Function Level of Independence: Independent Comments: wife assists with setting out clothes. Pt has bladder incontinence (Wife would help him set up things, but he could perform most) Communication Communication: Other (comment) (Cognitive deficits) Dominant Hand: Right    Cognition  Cognition Arousal/Alertness: Lethargic Behavior During Therapy: Flat affect Overall Cognitive Status: Impaired/Different from baseline Area of Impairment: Attention;Memory;Orientation;Following commands;Safety/judgement;Problem solving;Awareness Orientation Level: Disoriented to;Situation Current Attention Level: Focused Memory: Decreased short-term memory;Decreased recall of precautions Following Commands: Follows one step commands with increased time Safety/Judgement: Decreased awareness of safety;Decreased awareness of deficits Awareness: Intellectual Problem Solving: Slow processing;Decreased initiation;Difficulty sequencing;Requires verbal cues;Requires tactile cues     Mobility    Exercises      Balance   End of Session OT - End of Session Activity Tolerance: Patient tolerated treatment well Patient left: in chair;with call bell/phone within reach Nurse Communication: Mobility status  GO     Kendell Sagraves,HILLARY 12/28/2013, 2:52 PM [facility], OTR/L  [phone] 12/28/2013

## 2013-12-28 NOTE — Progress Notes (Addendum)
ANTICOAGULATION CONSULT NOTE - Initial Consult  Pharmacy Consult for xarelto Indication: atrial fibrillation  Allergies  Allergen Reactions  . Diazepam     REACTION: agitation  . Ezetimibe-Simvastatin     unknown  . Morphine Other (See Comments)    Hallucinations.  . Sulfonamide Derivatives Hives    Patient Measurements: Height: 5\' 8"  (172.7 cm) Weight: 171 lb 6.4 oz (77.747 kg) IBW/kg (Calculated) : 68.4 Heparin Dosing Weight: 77 kg  Vital Signs: Temp: 97.8 F (36.6 C) (01/02 1159) Temp src: Axillary (01/02 1159) BP: 105/57 mmHg (01/02 1159) Pulse Rate: 126 (01/02 1159)  Labs:  Recent Labs  12/26/13 0440 12/27/13 0549 12/27/13 1145 12/28/13 0702  HGB 11.5* 12.0*  --  13.3  HCT 33.5* 34.4*  --  37.1*  PLT 146* 132*  --  145*  APTT  --   --  34 34  HEPARINUNFRC  --   --  <0.10* <0.10*  CREATININE 1.05 1.01  --  1.02    Estimated Creatinine Clearance: 52.2 ml/min (by C-G formula based on Cr of 1.02).   Medical History: Past Medical History  Diagnosis Date  . NEOPLASM, MALIGNANT, BLADDER 04/10/2009  . HYPERLIPIDEMIA 04/10/2009  . ANEMIA-NOS 04/10/2009  . 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 ANXIETY 04/10/2009  . HYPERTENSION 04/10/2009  . Atrial fibrillation 04/10/2009  . COPD 04/10/2009  . TRANSIENT ISCHEMIC ATTACK, HX OF 04/10/2009  . Bladder cancer     Dr. Lawerance Bach  . Lacunar stroke 04/07/2011  . Pleural effusion 04/07/2011  . 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 Stroke     Medications:  Prescriptions prior to admission  Medication Sig Dispense Refill  . Cholecalciferol (VITAMIN D) 2000 UNITS tablet Take 2,000-3,000 Units by mouth daily. Patient takes 2000 units daily, except for taking 3000 units (1.5 tablets) on "winter days" and days when "not feeling well."      . digoxin (LANOXIN) 0.125 MG tablet Take 1 tablet (0.125 mg total) by mouth daily.  30 tablet  5  . diltiazem (DILACOR XR) 120 MG 24 hr capsule Take 120 mg by mouth daily.      . Probiotic  Product (PROBIOTIC DAILY PO) Take 1 tablet by mouth every morning.       . Rivaroxaban (XARELTO) 15 MG TABS tablet Take 1 tablet (15 mg total) by mouth daily.  30 tablet  12    Assessment: Mr. Starling is an 80 yo man on Xarelto 15 mg po daily for Afib per Dr. Mare Ferrari.  It was held on admission due to hematuria.  Xarelto was ordered to be resumed 1/1 as urine more clear but he failed his swallow evaluation.  IV heparin was not indicated 2nd to pt has a new L parietal lobe infarct felt to be embolic 2nd to known afib.  Wt 77.7kg, creat 1.01, Hg 13.3, pltc low at 145 [phone]). His creat cl qualifies him for Xarelto 20 mg po q evening.  I am not able to determine why Dr. Mare Ferrari choose to put him on the lower dose of 15 mg qday as he does not appear to have a history of renal insufficiency.    Plan:   1. Resume Xarelto 15 mg po q supper as previously prescribed by Dr. Mare Ferrari. 2. Have pt f/u with Dr. Mare Ferrari about increasing dose to 20 mg qday 3. F/u hematuria 4. Dc LMWH 40 qday order Eudelia Bunch, Pharm.D. [phone] 12/28/2013 12:28 PM

## 2013-12-28 NOTE — Progress Notes (Signed)
Stroke Team Progress Note  HISTORY [name] is an 80 y.o. male with a past medical history significant for HTN, hyperlipidemia, atrial fibrillation ( off xarelto due to hematuria during this admission), right lacunar stroke with residual mild left leg weakness, bladder cancer, immune thrombocytopenic purpura s/p splenectomy, admitted to [facility] due to confusion in the context of influenza and possible UTI.  His wife is at the bedside and tells me that she is very concerned with the fact that her husband is "less responsive, no following commands, can not even feed himself, and is mumbling, having trouble finding words out, and I can not really understand what he is trying to say". Although he has been confused since admission, this morning he became less able to engage and participate in conversations.  This change in his clinical status prompted a CT brain that revealed no acute abnormality. At this moment he is lethargic and not able to follow commands. Has been off Xarelto during this admission due to hematuria. Patient was not a TPA candidate secondary to unknown time of onset.  SUBJECTIVE His wife is at the bedside. She feels that he is much improved compared to yesterday. Per his wife, he appears to be more alert, knows who family members are, interacting with family appropriately.   OBJECTIVE Most recent Vital Signs: Filed Vitals:   12/27/13 2200 12/27/13 2333 12/28/13 0000 12/28/13 0400  BP: 115/81 115/75 110/67 108/81  Pulse: 85 112 113 95  Temp:    98.4 F (36.9 C)  TempSrc:    Oral  Resp: 16   18  Height:      Weight:      SpO2: 97%  98% 98%   CBG (last 3)  No results found for this basename: GLUCAP,  in the last 72 hours  IV Fluid Intake:     MEDICATIONS  . cholecalciferol  3,000 Units Oral Daily  . digoxin  0.125 mg Oral Daily  . diltiazem  180 mg Oral Daily  . FLORA-Q  1 capsule Oral q morning - 10a  . levofloxacin  500 mg Oral Daily  . metoprolol  5 mg Intravenous Q6H   . oseltamivir  75 mg Oral BID  . sodium chloride  3 mL Intravenous Q12H   PRN:  acetaminophen, acetaminophen  Diet:     Activity:  Up with assistance DVT Prophylaxis:  SCDs   CLINICALLY SIGNIFICANT STUDIES Basic Metabolic Panel:  Recent Labs Lab 12/26/13 0440 12/27/13 0549  NA 138 130*  K 4.4 4.0  CL 98 94*  CO2 29 24  GLUCOSE 96 104*  BUN 21 20  CREATININE 1.05 1.01  CALCIUM 8.6 7.9*   Liver Function Tests: No results found for this basename: AST, ALT, ALKPHOS, BILITOT, PROT, ALBUMIN,  in the last 168 hours CBC:  Recent Labs Lab 12/25/13 1549  12/27/13 0549 12/28/13 0702  WBC 5.6  < > 5.3 11.4*  NEUTROABS 4.2  --   --   --   HGB 12.2*  < > 12.0* 13.3  HCT 35.4*  < > 34.4* 37.1*  MCV 97.3  < > 96.4 94.9  PLT 168  < > 132* 145*  < > = values in this interval not displayed. Coagulation: No results found for this basename: LABPROT, INR,  in the last 168 hours Cardiac Enzymes: No results found for this basename: CKTOTAL, CKMB, CKMBINDEX, TROPONINI,  in the last 168 hours Urinalysis:  Recent Labs Lab 12/25/13 1421 12/26/13 1855  COLORURINE RED* YELLOW  LABSPEC 1.019 1.017  PHURINE 6.5 7.0  GLUCOSEU NEGATIVE NEGATIVE  HGBUR LARGE* LARGE*  BILIRUBINUR NEGATIVE NEGATIVE  KETONESUR 15* NEGATIVE  PROTEINUR 100* 30*  UROBILINOGEN 0.2 0.2  NITRITE NEGATIVE NEGATIVE  LEUKOCYTESUR SMALL* NEGATIVE   Lipid Panel    Component Value Date/Time   CHOL 165 08/18/2012 0350   TRIG 57 08/18/2012 0350   HDL 58 08/18/2012 0350   CHOLHDL 2.8 08/18/2012 0350   VLDL 11 08/18/2012 0350   LDLCALC 96 08/18/2012 0350   HgbA1C  Lab Results  Component Value Date   HGBA1C 6.0* 08/18/2012    Urine Drug Screen:   No results found for this basename: labopia, cocainscrnur, labbenz, amphetmu, thcu, labbarb    Alcohol Level: No results found for this basename: ETH,  in the last 168 hours   CT of the brain  12/27/2013   No acute intracranial pathology.    MRI of the brain  12/27/2013    Punctate acute infarction in the high left parietal lobe.  Extensive chronic ischemic changes elsewhere throughout the brain  MRA of the brain    2D Echocardiogram  06/15/2013  EF 55-60% with no source of embolus.   Carotid Doppler    CXR    EKG  atrial fibrillation .   Therapy Recommendations SLT dysphagia 1 diet, pending PT/OT  Physical Exam   Head: normocephalic.  Neck: supple, no bruits, no JVD.  Cardiac: no murmurs.  Lungs: clear.  Abdomen: soft, no tender, no mass.  Extremities: no edema.  Neurologic Examination:  Mental Status:  Open eyes to verbal commands but for the most part he is lethargic and not able to follow commands. Speech appears to be dysarthric.  Cranial Nerves:  Pupils 2-3 mm bilaterally, reactive to light. No gaze preference. EOM full without nystagmus. Blinks to threat. Face symmetric. Tongue midline.  Motor:  Moves all limbs symmetrically upon painful stimuli.  Tone and bulk:normal tone throughout; no atrophy noted  Sensory: withdraws to pain.  Deep Tendon Reflexes:  1+ all over  Plantars:  Right: downgoing Left: downgoing  Cerebellar:  Unable to test.  Gait:  Unable to test.  CV: pulses palpable throughout    ASSESSMENT [name] is a 80 y.o. male presenting with altered mental status, dysarthria. Imaging confirms a high left parietal lobe infarct, incidental finding. Infarct felt to be  embolic secondary to known atrial fibrillation.  On xarelto prior to admission. Now on no antithromboics given hemoaturia for secondary stroke prevention. Work up underway.   atrial fibrillation, on xarleto, which is now on hold due to hematuria during this admission (Dr. Mare Ferrari) hypertension Hyperlipidemia, LDL 96, on no statin PTA, at goal LDL < 100 Hx TIA 2010, lacunar stroke 2012 Hx bladder cancer Hx ITP, s/p spleenectomy  Hospital day # 3  TREATMENT/PLAN  Restart Xarelto when medically clear  OOB, therapy evals  Please call with any  further questions  Burnetta Sabin, MSN, RN, ANVP-BC, ANP-BC, GNP-BC [facility] Pager: [phone] 12/28/2013 8:16 AM  I have personally obtained a history, examined the patient, evaluated imaging results, and formulated the assessment and plan of care. I agree with the above.  Jim Like, DO  Neurology-Stroke

## 2013-12-28 NOTE — Progress Notes (Signed)
[facility] PROGRESS NOTE  [name] DTO:[id number] DOB: August 05, 1927 DOA: 12/25/2013 PCP: Cathlean Cower, MD  HPI/Subjective: Able to speak a few words today   Assessment/Plan: Principal Problem:   Encephalopathy Active Problems:   HYPERTENSION   Lacunar stroke   Post-splenectomy   Long term current use of anticoagulant   UTI (lower urinary tract infection)   Atrial fibrillation   Influenza with URT manifestations -Tested positive for type A influenza, started on Tamiflu x 5 days.  Acute CVA -neuro following -defer anticoagulation to them -was on xarelto  Acute encephalopathy -Likely secondary to influenza and questionable UTI and now CVA - IV Rocephin- no culture done- futile as on abx now -check head CT as h/o CVA and similar presentation  Dysphagia -ask SLP to see -DYS diet -watch for aspiration  SIRS -With fever and heart rate of 103 this is considered to SIRS. -Started on antibiotics empirically, patient is on IV Rocephin. -If patient spikes fever again, will consider to switch antibiotics to Zosyn and vancomycin.  Atrial fibrillation -IV meds until able to take PO cardizem  Hematuria -Repeat urinalysis as his urine is more clear  Code Status: Full code Family Communication: Plan discussed with the patient/wife Disposition Plan: PT/OT eval   Consultants:  None  Procedures:  None  Antibiotics:  None   Objective: Filed Vitals:   12/28/13 0938  BP: 126/66  Pulse: 129  Temp:   Resp:     Intake/Output Summary (Last 24 hours) at 12/28/13 1103 Last data filed at 12/27/13 1900  Gross per 24 hour  Intake      0 ml  Output      0 ml  Net      0 ml   Filed Weights   12/25/13 2335  Weight: 77.747 kg (171 lb 6.4 oz)    Exam: General: speaking clearer CVS: irregular- fast Chest: clear to auscultation bilaterally, no wheezing, Abdomen: soft nontender, nondistended, normal bowel sounds, no organomegaly Neuro- slow to  respond  Data Reviewed: Basic Metabolic Panel:  Recent Labs Lab 12/25/13 1549 12/26/13 0440 12/27/13 0549 12/28/13 0702  NA 134* 138 130* 132*  K 4.7 4.4 4.0 4.2  CL 95* 98 94* 91*  CO2 28 29 24 26   GLUCOSE 100* 96 104* 103*  BUN 24* 21 20 25*  CREATININE 1.04 1.05 1.01 1.02  CALCIUM 8.7 8.6 7.9* 8.6   Liver Function Tests: No results found for this basename: AST, ALT, ALKPHOS, BILITOT, PROT, ALBUMIN,  in the last 168 hours No results found for this basename: LIPASE, AMYLASE,  in the last 168 hours No results found for this basename: AMMONIA,  in the last 168 hours CBC:  Recent Labs Lab 12/25/13 1549 12/26/13 0440 12/27/13 0549 12/28/13 0702  WBC 5.6 4.7 5.3 11.4*  NEUTROABS 4.2  --   --   --   HGB 12.2* 11.5* 12.0* 13.3  HCT 35.4* 33.5* 34.4* 37.1*  MCV 97.3 97.4 96.4 94.9  PLT 168 146* 132* 145*   Cardiac Enzymes: No results found for this basename: CKTOTAL, CKMB, CKMBINDEX, TROPONINI,  in the last 168 hours BNP (last 3 results) No results found for this basename: PROBNP,  in the last 8760 hours CBG: No results found for this basename: GLUCAP,  in the last 168 hours  Micro Recent Results (from the past 240 hour(s))  CULTURE, BLOOD (ROUTINE X 2)     Status: None   Collection Time    12/25/13  7:13 PM  Result Value Range Status   Specimen Description BLOOD ARM LEFT   Final   Special Requests BOTTLES DRAWN AEROBIC AND ANAEROBIC 10CC   Final   Culture  Setup Time     Final   Value: 12/26/2013 04:56     Performed at [facility]   Culture     Final   Value:        BLOOD CULTURE RECEIVED NO GROWTH TO DATE CULTURE WILL BE HELD FOR 5 DAYS BEFORE ISSUING A FINAL NEGATIVE REPORT     Performed at [facility]   Report Status PENDING   Incomplete  CULTURE, BLOOD (ROUTINE X 2)     Status: None   Collection Time    12/25/13  7:18 PM      Result Value Range Status   Specimen Description BLOOD HAND LEFT   Final   Special Requests BOTTLES DRAWN  AEROBIC AND ANAEROBIC 10CC   Final   Culture  Setup Time     Final   Value: 12/26/2013 04:56     Performed at [facility]   Culture     Final   Value:        BLOOD CULTURE RECEIVED NO GROWTH TO DATE CULTURE WILL BE HELD FOR 5 DAYS BEFORE ISSUING A FINAL NEGATIVE REPORT     Performed at [facility]   Report Status PENDING   Incomplete     Studies: Ct Head Wo Contrast  12/27/2013   CLINICAL DATA:  Patient having trouble finding words.  EXAM: CT HEAD WITHOUT CONTRAST  TECHNIQUE: Contiguous axial images were obtained from the base of the skull through the vertex without intravenous contrast.  COMPARISON:  08/20/2012  FINDINGS: There is no evidence of mass effect, midline shift, or extra-axial fluid collections. There is no evidence of a space-occupying lesion or intracranial hemorrhage. There is no evidence of a cortical-based area of acute infarction. There is an old right corona radiata infarct. There is an old left basal ganglia lacunar infarct. . There is generalized cerebral atrophy. There is periventricular white matter low attenuation likely secondary to microangiopathy.  The ventricles and sulci are appropriate for the patient's age. The basal cisterns are patent.  Visualized portions of the orbits are unremarkable. The visualized portions of the paranasal sinuses and mastoid air cells are unremarkable.  The osseous structures are unremarkable.  IMPRESSION: No acute intracranial pathology.   Electronically Signed   By: Kathreen Devoid   On: 12/27/2013 11:14   Mr Brain Wo Contrast  12/27/2013   CLINICAL DATA:  Stroke.  Altered mental status.  Speech disturbance.  EXAM: MRI HEAD WITHOUT CONTRAST  TECHNIQUE: Multiplanar, multiecho pulse sequences of the brain and surrounding structures were obtained without intravenous contrast.  COMPARISON:  Head CT same day .  FINDINGS: Diffusion imaging shows a punctate acute infarction in the left parietal lobe near the vertex. No other acute insult.  There chronic small-vessel changes affecting the pons. There are old small vessel cerebellar infarctions. There are numerous old lacunar infarctions affecting the basal ganglia, thalami and hemispheric deep white matter. There is widespread chronic small vessel disease throughout the white matter of the cerebral hemispheres. No mass lesion, acute hemorrhage, hydrocephalus or extra-axial collection. No pituitary mass. No inflammatory sinus disease. There is a small amount of fluid in the mastoid air cells on the right. Major vessels at the base of the brain show flow.  IMPRESSION: Punctate acute infarction in the high left parietal lobe.  Extensive chronic ischemic changes elsewhere throughout the brain as outlined above.   Electronically Signed   By: Nelson Chimes M.D.   On: 12/27/2013 17:01    Scheduled Meds: . cholecalciferol  3,000 Units Oral Daily  . digoxin  0.125 mg Oral Daily  . FLORA-Q  1 capsule Oral q morning - 10a  . levofloxacin  500 mg Oral Daily  . metoprolol  5 mg Intravenous Q6H  . oseltamivir  75 mg Oral BID  . sodium chloride  3 mL Intravenous Q12H   Continuous Infusions:       Time spent: 35 minutes    Alysabeth Scalia  [facility] Pager [phone] If 7PM-7AM, please contact night-coverage at [email or web address], password [email or web address] 12/28/2013, 11:03 AM  LOS: 3 days

## 2013-12-28 NOTE — Progress Notes (Signed)
Rehab Admissions Coordinator Note:  Patient was screened by Retta Diones for appropriateness for an Inpatient Acute Rehab Consult.  At this time, an inpatient rehab consult has already been completed.  We will follow up on Monday.  Retta Diones 12/28/2013, 3:56 PM  I can be reached at [phone].

## 2013-12-28 NOTE — Progress Notes (Signed)
Speech Language Pathology Treatment: Dysphagia  Patient Details Name: [name] MRN: [id number] DOB: July 07, 1927 Today's Date: 12/28/2013 Time: 4270-6237 SLP Time Calculation (min): 15 min  Assessment / Plan / Recommendation Clinical Impression  F/u after yesterday's swallow evaluation.  Pt subsequently dx with acute left parietal CVA per MRI 12/27/13.  Today, pt's speech improved from yesterday per wife.  Observed with word-retrieval deficits; inattention continues to be problematic.  Provided with purees and nectar-thick liquids - overall toleration appears similar to yesterday's performance, however there is worsened oral holding, delayed initiation of swallow sequence.  Requires max cues to attend to and sustain motor activity for swallowing.  Airway protection appears to be adequate with dysphagia 1, nectar-thick liquids.  Recommend continuing this diet; SLP to follow.  Pt may benefit from speech-language evaluation given new CVA.     HPI HPI: 80 y.o. male with hx of bladder cancer, undergoing new treatment at outside facility, hx of afib on Xarelto (Dr Mare Ferrari), prior CVA, HTN, hyperlipidemia, s/p splenectomy.  Admitted with  acute encephalopathy, likely secondary to influenza and questionable UTI; SIRS, hematuria.  Has been on dysphagia 3 diet with crushed meds since admission - now with difficulty swallowing.        SLP Plan  Continue with current plan of care    Recommendations Diet recommendations: Dysphagia 1 (puree);Nectar-thick liquid Liquids provided via: Cup Medication Administration: Crushed with puree Supervision: Full supervision/cueing for compensatory strategies Compensations: Slow rate;Small sips/bites Postural Changes and/or Swallow Maneuvers: Seated upright 90 degrees              General recommendations: Rehab consult Oral Care Recommendations: Oral care BID Follow up Recommendations: Inpatient Rehab Plan: Continue with current plan of care    Leonard Pereida L.  [name], [place] CCC/SLP Pager [phone]      [name] 12/28/2013, 11:38 AM

## 2013-12-28 NOTE — Evaluation (Signed)
Physical Therapy Evaluation Patient Details Name: [name] MRN: [id number] DOB: 25-Dec-1927 Today's Date: 12/28/2013 Time: 1287-8676 PT Time Calculation (min): 45 min  PT Assessment / Plan / Recommendation History of Present Illness  [name] is an 80 y.o. male with a past medical history significant for HTN, hyperlipidemia, atrial fibrillation ( off xarelto due to hematuria during this admission), right lacunar stroke with residual mild left leg weakness, bladder cancer, immune thrombocytopenic purpura s/p splenectomy.  Admitted to [facility] due to confusion in the context of influenza and possible UTI.  CT negative for CVA, MRI report reveals L parietal infarct with chronic ischemic disease in basal ganglia.  Clinical Impression  Pt admitted with above. Pt currently with functional limitations due to the deficits listed below (see PT Problem List).  Patient is able to follow simple commands today and answer simple questions.  Patient is able to verbalize need to urinate x2.  Patient is very lethargic, but is very willing to put forth best effort and attempt therapy. Patient has been through a previous stroke in 2012, and was very satisfied with CIR for rehabilitation, and requests to be placed there again after d/c. Pt will benefit from skilled PT to increase their independence and safety with mobility to allow discharge to the venue listed below.      PT Assessment  Patient needs continued PT services    Follow Up Recommendations  CIR    Does the patient have the potential to tolerate intense rehabilitation    TBD     Equipment Recommendations  Other (comment) (TBD)    Recommendations for Other Services Rehab consult   Frequency Min 4X/week    Precautions / Restrictions Precautions Precautions: Fall Restrictions Weight Bearing Restrictions: No   Pertinent Vitals/Pain On 2L O2 throughout session. 98% saturation at beginning of session.      Mobility  Bed Mobility Bed  Mobility: Supine to Sit Supine to Sit: 1: +2 Total assist Supine to Sit: Patient Percentage: 40% Details for Bed Mobility Assistance: Is able to start to move his legs to EOB, Requires A to complete this task.  Also needs PT A to supprt trunk as his legs are pulled into upright sitting position. Transfers Transfers: Risk manager;Sit to Stand;Stand to Sit Sit to Stand: 1: +2 Total assist Sit to Stand: Patient Percentage: 60% Stand to Sit: 1: +2 Total assist Stand to Sit: Patient Percentage: 60% Stand Pivot Transfers: 1: +2 Total assist Stand Pivot Transfers: Patient Percentage: 60% Details for Transfer Assistance: Tends to lean posteriorly with all standing and transfers.  He requires A at his hips to achieve upright standing and to pull forward to avoid posterior lean.  Sit to stand x1 without RW, and x1 with RW.  There is no significant difference in his performance with or without RW.  He may be more comfortable in the future with RW so he has something to hold onto for balance.  He is unable to independently take small steps for pivot, and require TC to move legs and rotate his torso. Ambulation/Gait Ambulation/Gait Assistance: Not tested (comment)        PT Diagnosis: Difficulty walking;Generalized weakness  PT Problem List: Decreased strength;Decreased balance;Decreased activity tolerance;Decreased coordination;Decreased mobility PT Treatment Interventions: Gait training;Stair training;Functional mobility training;Therapeutic activities;Therapeutic exercise;Balance training;Neuromuscular re-education;Patient/family education     PT Goals(Current goals can be found in the care plan section) Acute Rehab PT Goals Patient Stated Goal: get better PT Goal Formulation: With patient Time For Goal Achievement: 01/11/14  Potential to Achieve Goals: Good  Visit Information  Last PT Received On: 12/28/13 Assistance Needed: +2 History of Present Illness: [name] is an 81 y.o.  male with a past medical history significant for HTN, hyperlipidemia, atrial fibrillation ( off xarelto due to hematuria during this admission), right lacunar stroke with residual mild left leg weakness, bladder cancer, immune thrombocytopenic purpura s/p splenectomy.  Admitted to [facility] due to confusion in the context of influenza and possible UTI.  CT negative for CVA, MRI report reveals L parietal infarct with chronic ischemic disease in basal ganglia.       Prior [place] expects to be discharged to:: Inpatient rehab Living Arrangements: Spouse/significant other Type of Home: House Home Access: Stairs to enter [employer] of Steps: 4 Entrance Stairs-Rails: Right Home Layout: One level Home Equipment: None Prior Function Level of Independence: Needs assistance Comments: Semi-independent (Wife would help him set up things, but he could perform most tasks by himself). Communication Communication: Other (comment) (Cognitive deficits)    Cognition  Cognition Arousal/Alertness: Lethargic Behavior During Therapy: Flat affect Overall Cognitive Status: Impaired/Different from baseline Area of Impairment: Attention;Memory;Orientation;Following commands;Safety/judgement;Problem solving;Awareness Current Attention Level: Focused Memory: Decreased short-term memory;Decreased recall of precautions Following Commands: Follows one step commands consistently Safety/Judgement: Decreased awareness of safety;Decreased awareness of deficits    Extremity/Trunk Assessment Upper Extremity Assessment Upper Extremity Assessment: Defer to OT evaluation Lower Extremity Assessment Lower Extremity Assessment: Generalized weakness   Balance Balance Balance Assessed: Yes Static Sitting Balance Static Sitting - Balance Support: Bilateral upper extremity supported Static Sitting - Level of Assistance: 3: Mod assist Static Sitting - Comment/# of Minutes: 10 (Is able to  intermittently hold himself up for 20 second bouts, but requires A to avoid the posterior lean that he prefers and falls into)  End of Session PT - End of Session Equipment Utilized During Treatment: Gait belt Activity Tolerance: Patient tolerated treatment well;Patient limited by lethargy;Patient limited by fatigue Patient left: in chair;with call bell/phone within reach;with family/visitor present Nurse Communication: Mobility status  GP    Cordelia Poche, SPT Pager:  [phone]  Cordelia Poche 12/28/2013, 11:14 AM

## 2013-12-28 NOTE — Progress Notes (Signed)
Occupational Therapy Evaluation Patient Details Name: [name] MRN: [id number] DOB: 07/25/1927 Today's Date: 12/28/2013 Time: 8938-1017 OT Time Calculation (min): 21 min  OT Assessment / Plan / Recommendation History of present illness [name] is an 80 y.o. male with a past medical history significant for HTN, hyperlipidemia, atrial fibrillation ( off xarelto due to hematuria during this admission), right lacunar stroke with residual mild left leg weakness, bladder cancer, immune thrombocytopenic purpura s/p splenectomy.  Admitted to [facility] due to confusion in the context of influenza and possible UTI.  CT negative for CVA, MRI report reveals L parietal infarct with chronic ischemic disease in basal ganglia.   Clinical Impression   PTA, pt lived at home with wife and was mod i with ADL and independent with mobility. Pt with significant fucntional decline due to deficits below and will benefit from rehab at CIR to facilitate D/C home with supportive wife. Pt will benefit from skilled OT services to facilitate D/C to next venue due to below deficits.    OT Assessment  Patient needs continued OT Services    Follow Up Recommendations  [facility]    Barriers to Discharge      Equipment Recommendations  None recommended by OT    Recommendations for Other Services Rehab consult  Frequency  Min 3X/week    Precautions / Restrictions Precautions Precautions: Fall Restrictions Weight Bearing Restrictions: No   Pertinent Vitals/Pain no apparent distress     ADL  Eating/Feeding: Other (comment);Moderate assistance (nectar thick, purred, max vc) Where Assessed - Eating/Feeding: Chair Grooming: Moderate assistance Where Assessed - Grooming: Supported sitting Upper Body Bathing: Moderate assistance Where Assessed - Upper Body Bathing: Supported sitting Lower Body Bathing: Maximal assistance Where Assessed - Lower Body Bathing: Supported sit to stand Upper Body Dressing: Maximal  assistance Where Assessed - Upper Body Dressing: Supported sitting Lower Body Dressing: Maximal assistance Where Assessed - Lower Body Dressing: Supported sit to Lobbyist: Maximal assistance Toilet Transfer Method: Sit to stand Toileting - Water quality scientist and Hygiene: Maximal assistance Where Assessed - Best boy and Hygiene: Sit to stand from 3-in-1 or toilet Equipment Used: Gait belt Transfers/Ambulation Related to ADLs: Max A transfers. Did not ambulate ADL Comments: affected by weakness and cognitive deficits    OT Diagnosis: Generalized weakness;Cognitive deficits  OT Problem List: Decreased strength;Decreased range of motion;Decreased activity tolerance;Impaired balance (sitting and/or standing);Decreased coordination;Decreased cognition;Decreased safety awareness;Decreased knowledge of use of DME or AE;Decreased knowledge of precautions OT Treatment Interventions: Self-care/ADL training;Therapeutic exercise;Energy conservation;DME and/or AE instruction;Therapeutic activities;Cognitive remediation/compensation;Patient/family education;Balance training   OT Goals(Current goals can be found in the care plan section) Acute Rehab OT Goals Patient Stated Goal: get better OT Goal Formulation: With patient/family Time For Goal Achievement: 01/11/14 Potential to Achieve Goals: Good  Visit Information  Last OT Received On: 12/28/13 Assistance Needed: +2 History of Present Illness: [name] is an 80 y.o. male with a past medical history significant for HTN, hyperlipidemia, atrial fibrillation ( off xarelto due to hematuria during this admission), right lacunar stroke with residual mild left leg weakness, bladder cancer, immune thrombocytopenic purpura s/p splenectomy.  Admitted to [facility] due to confusion in the context of influenza and possible UTI.  CT negative for CVA, MRI report reveals L parietal infarct with chronic ischemic disease in basal  ganglia.       Prior [place] expects to be discharged to:: Inpatient rehab Living Arrangements: Spouse/significant other Type of Home: House Home Access: Stairs  to enter Entrance Stairs-Number of Steps: 4 Entrance Stairs-Rails: Right Home Layout: One level Home Equipment: Shower seat;Hand held shower head Additional Comments: tub shower.  Prior Function Level of Independence: Independent Comments: wife assists with setting out clothes. Pt has bladder incontinence (Wife would help him set up things, but he could perform most) Communication Communication: Other (comment) (Cognitive deficits) Dominant Hand: Right         Vision/Perception Vision - History Baseline Vision: Wears glasses all the time Visual History: Cataracts Vision - Assessment Eye Alignment: Within Functional Limits Vision Assessment: Vision not tested Perception Perception: Impaired Spatial Orientation: noted overshooting when reaching for objects. will further assess Praxis Praxis: Impaired Praxis Impairment Details: Initiation;Perseveration   Cognition  Cognition Arousal/Alertness: Lethargic Behavior During Therapy: Flat affect Overall Cognitive Status: Impaired/Different from baseline Area of Impairment: Attention;Memory;Orientation;Following commands;Safety/judgement;Problem solving;Awareness Orientation Level: Disoriented to;Situation Current Attention Level: Focused Memory: Decreased short-term memory;Decreased recall of precautions Following Commands: Follows one step commands with increased time Safety/Judgement: Decreased awareness of safety;Decreased awareness of deficits Awareness: Intellectual Problem Solving: Slow processing;Decreased initiation;Difficulty sequencing;Requires verbal cues;Requires tactile cues    Extremity/Trunk Assessment Upper Extremity Assessment Upper Extremity Assessment: Generalized weakness Lower Extremity Assessment Lower  Extremity Assessment: Generalized weakness Cervical / Trunk Assessment Cervical / Trunk Assessment: Kyphotic;Other exceptions Cervical / Trunk Exceptions: posterior bias     Mobility Bed Mobility Bed Mobility: Supine to Sit Transfers Transfers: Sit to Stand;Stand to Sit Sit to Stand: 2: Max assist;From chair/3-in-1 Stand to Sit: 2: Max assist Details for Transfer Assistance: Posterior bias. unable to achieve full upright posture     Exercise     Balance Balance Balance Assessed: Yes Static Sitting Balance Static Sitting - Balance Support: Bilateral upper extremity supported;Feet supported Static Sitting - Level of Assistance: 3: Mod assist   End of Session OT - End of Session Activity Tolerance: Patient tolerated treatment well Patient left: in chair;with call bell/phone within reach Nurse Communication: Mobility status  GO     Louna Rothgeb,HILLARY 12/28/2013, 2:44 PM [facility], OTR/L  [phone] 12/28/2013

## 2013-12-28 NOTE — Evaluation (Signed)
Read, reviewed, edited and agree with student's findings and recommendations.  Lofton Leon B. Ernie Sagrero, PT, DPT #[phone]  

## 2013-12-29 LAB — LIPID PANEL
Cholesterol: 107 mg/dL (ref 0–200)
HDL: 49 mg/dL (ref >39)
LDL Cholesterol: 47 mg/dL (ref 0–99)
Total CHOL/HDL Ratio: 2.2 RATIO
Triglycerides: 53 mg/dL (ref <150)
VLDL: 11 mg/dL (ref 0–40)

## 2013-12-29 LAB — HEMOGLOBIN A1C
Hgb A1c MFr Bld: 5.8 % — ABNORMAL HIGH (ref <5.7)
MEAN PLASMA GLUCOSE: 120 mg/dL — AB (ref <117)

## 2013-12-29 LAB — BASIC METABOLIC PANEL
BUN: 36 mg/dL — ABNORMAL HIGH (ref 6–23)
CO2: 26 mEq/L (ref 19–32)
CREATININE: 1.03 mg/dL (ref 0.50–1.35)
Calcium: 8.6 mg/dL (ref 8.4–10.5)
Chloride: 93 mEq/L — ABNORMAL LOW (ref 96–112)
GFR calc non Af Amer: 65 mL/min — ABNORMAL LOW (ref >90)
GFR, EST AFRICAN AMERICAN: 75 mL/min — AB (ref >90)
Glucose, Bld: 95 mg/dL (ref 70–99)
Potassium: 4 mEq/L (ref 3.7–5.3)
Sodium: 131 mEq/L — ABNORMAL LOW (ref 137–147)

## 2013-12-29 NOTE — Progress Notes (Addendum)
[facility] PROGRESS NOTE  [name] [id number] DOB: 03/30/1927 DOA: 12/25/2013 PCP: Cathlean Cower, MD  [name] is an 80 y.o. male with hx of bladder cancer, undergoing new treatment at outside facility, hx of afib on Xarelto (Dr Mare Ferrari), prior CVA, HTN, hyperlipidemia, s/p splenectomy, brought to the ER as he was confused.  He didn't recognized his son, and was not feeling right. He had some dark urine, and was bloody. Wife stated that he was feeling a little feverish. There were no nausea, vomiting, chest pain or shortness of breath. He had a slight nonproductive coughs. Evalaution in the ER included a UA, which showed only 2 WBC, TNTC RBCs, and a normal WBC and HB of 12 g, with normal Cr and electrolytes. His EKG showed afib, rate control. He has a low grade temp of 100F. He is now alert, orient, and recognized his family and correctly stated his son's birthday. Suspicious that he has a UTI, he was given Rocephin, and hospitalist was asked to admit him for further Tx ON day 2 he was having word finding difficulties and MRI was done which showed CVA- neuro consulted Plan for possible CIR  HPI/Subjective: Much more awake today -talking about football game on last night and his wife is feeling poorly   Assessment/Plan: Principal Problem:   Encephalopathy Active Problems:   HYPERTENSION   Lacunar stroke   Post-splenectomy   Long term current use of anticoagulant   UTI (lower urinary tract infection)   Atrial fibrillation   Influenza with URT manifestations -Tested positive for type A influenza, started on Tamiflu x 5 days.  Acute CVA -neuro following -resume xarelto -no further work up required  Acute encephalopathy -Likely secondary to influenza and questionable UTI and now CVA - IV Rocephin x 1- now levaquin 12/31 (missd 1/1) and resumed 1/2-- end on 1/5 -check head CT as h/o CVA and similar presentation   Dysphagia -SLP -DYS diet -watch for  aspiration  SIRS -With fever and heart rate of 103 this is considered to SIRS. -Started on antibiotics empirically- no further issues  Atrial fibrillation -change to cardizem short acting as patient's meds were not able to be crushed  Hematuria -Repeat urinalysis as his urine is more clear Monitor as we resume xarelto   Code Status: Full code Family Communication: son via telephone Disposition Plan: PT/OT eval- CIR- consult in place   Consultants:  None  Procedures:  None  Antibiotics:  None   Objective: Filed Vitals:   12/29/13 0400  BP: 109/74  Pulse: 110  Temp: 97.9 F (36.6 C)  Resp: 18    Intake/Output Summary (Last 24 hours) at 12/29/13 0744 Last data filed at 12/29/13 0426  Gross per 24 hour  Intake    426 ml  Output    200 ml  Net    226 ml   Filed Weights   12/25/13 2335  Weight: 77.747 kg (171 lb 6.4 oz)    Exam: General: speaking clearer CVS: irregular- fast Chest: clear to auscultation bilaterally, no wheezing, Abdomen: soft nontender, nondistended, normal bowel sounds, no organomegaly Neuro- slow to respond  Data Reviewed: Basic Metabolic Panel:  Recent Labs Lab 12/25/13 1549 12/26/13 0440 12/27/13 0549 12/28/13 0702 12/29/13 0521  NA 134* 138 130* 132* 131*  K 4.7 4.4 4.0 4.2 4.0  CL 95* 98 94* 91* 93*  CO2 28 29 24 26 26   GLUCOSE 100* 96 104* 103* 95  BUN 24* 21 20 25* 36*  CREATININE 1.04 1.05  1.01 1.02 1.03  CALCIUM 8.7 8.6 7.9* 8.6 8.6   Liver Function Tests: No results found for this basename: AST, ALT, ALKPHOS, BILITOT, PROT, ALBUMIN,  in the last 168 hours No results found for this basename: LIPASE, AMYLASE,  in the last 168 hours No results found for this basename: AMMONIA,  in the last 168 hours CBC:  Recent Labs Lab 12/25/13 1549 12/26/13 0440 12/27/13 0549 12/28/13 0702  WBC 5.6 4.7 5.3 11.4*  NEUTROABS 4.2  --   --   --   HGB 12.2* 11.5* 12.0* 13.3  HCT 35.4* 33.5* 34.4* 37.1*  MCV 97.3 97.4 96.4  94.9  PLT 168 146* 132* 145*   Cardiac Enzymes: No results found for this basename: CKTOTAL, CKMB, CKMBINDEX, TROPONINI,  in the last 168 hours BNP (last 3 results) No results found for this basename: PROBNP,  in the last 8760 hours CBG: No results found for this basename: GLUCAP,  in the last 168 hours  Micro Recent Results (from the past 240 hour(s))  CULTURE, BLOOD (ROUTINE X 2)     Status: None   Collection Time    12/25/13  7:13 PM      Result Value Range Status   Specimen Description BLOOD ARM LEFT   Final   Special Requests BOTTLES DRAWN AEROBIC AND ANAEROBIC 10CC   Final   Culture  Setup Time     Final   Value: 12/26/2013 04:56     Performed at [facility]   Culture     Final   Value:        BLOOD CULTURE RECEIVED NO GROWTH TO DATE CULTURE WILL BE HELD FOR 5 DAYS BEFORE ISSUING A FINAL NEGATIVE REPORT     Performed at [facility]   Report Status PENDING   Incomplete  CULTURE, BLOOD (ROUTINE X 2)     Status: None   Collection Time    12/25/13  7:18 PM      Result Value Range Status   Specimen Description BLOOD HAND LEFT   Final   Special Requests BOTTLES DRAWN AEROBIC AND ANAEROBIC 10CC   Final   Culture  Setup Time     Final   Value: 12/26/2013 04:56     Performed at [facility]   Culture     Final   Value:        BLOOD CULTURE RECEIVED NO GROWTH TO DATE CULTURE WILL BE HELD FOR 5 DAYS BEFORE ISSUING A FINAL NEGATIVE REPORT     Performed at [facility]   Report Status PENDING   Incomplete     Studies: Ct Head Wo Contrast  12/27/2013   CLINICAL DATA:  Patient having trouble finding words.  EXAM: CT HEAD WITHOUT CONTRAST  TECHNIQUE: Contiguous axial images were obtained from the base of the skull through the vertex without intravenous contrast.  COMPARISON:  08/20/2012  FINDINGS: There is no evidence of mass effect, midline shift, or extra-axial fluid collections. There is no evidence of a space-occupying lesion or intracranial  hemorrhage. There is no evidence of a cortical-based area of acute infarction. There is an old right corona radiata infarct. There is an old left basal ganglia lacunar infarct. . There is generalized cerebral atrophy. There is periventricular white matter low attenuation likely secondary to microangiopathy.  The ventricles and sulci are appropriate for the patient's age. The basal cisterns are patent.  Visualized portions of the orbits are unremarkable. The visualized portions of the paranasal sinuses and  mastoid air cells are unremarkable.  The osseous structures are unremarkable.  IMPRESSION: No acute intracranial pathology.   Electronically Signed   By: Kathreen Devoid   On: 12/27/2013 11:14   Mr Brain Wo Contrast  12/27/2013   CLINICAL DATA:  Stroke.  Altered mental status.  Speech disturbance.  EXAM: MRI HEAD WITHOUT CONTRAST  TECHNIQUE: Multiplanar, multiecho pulse sequences of the brain and surrounding structures were obtained without intravenous contrast.  COMPARISON:  Head CT same day .  FINDINGS: Diffusion imaging shows a punctate acute infarction in the left parietal lobe near the vertex. No other acute insult. There chronic small-vessel changes affecting the pons. There are old small vessel cerebellar infarctions. There are numerous old lacunar infarctions affecting the basal ganglia, thalami and hemispheric deep white matter. There is widespread chronic small vessel disease throughout the white matter of the cerebral hemispheres. No mass lesion, acute hemorrhage, hydrocephalus or extra-axial collection. No pituitary mass. No inflammatory sinus disease. There is a small amount of fluid in the mastoid air cells on the right. Major vessels at the base of the brain show flow.  IMPRESSION: Punctate acute infarction in the high left parietal lobe.  Extensive chronic ischemic changes elsewhere throughout the brain as outlined above.   Electronically Signed   By: Nelson Chimes M.D.   On: 12/27/2013 17:01     Scheduled Meds: . cholecalciferol  3,000 Units Oral Daily  . digoxin  0.125 mg Oral Daily  . diltiazem  30 mg Oral Q6H  . FLORA-Q  1 capsule Oral q morning - 10a  . levofloxacin  500 mg Oral Daily  . oseltamivir  75 mg Oral BID  . rivaroxaban  15 mg Oral Q supper  . sodium chloride  3 mL Intravenous Q12H   Continuous Infusions: . sodium chloride 20 mL/hr at 12/28/13 1450       Time spent: 35 minutes    Faisal Stradling  [facility] Pager [phone] If 7PM-7AM, please contact night-coverage at [email or web address], password [facility] 12/29/2013, 7:44 AM  LOS: 4 days

## 2013-12-30 LAB — CBC
HEMATOCRIT: 35.4 % — AB (ref 39.0–52.0)
HEMOGLOBIN: 12.1 g/dL — AB (ref 13.0–17.0)
MCH: 33.2 pg (ref 26.0–34.0)
MCHC: 34.2 g/dL (ref 30.0–36.0)
MCV: 97 fL (ref 78.0–100.0)
Platelets: 128 10*3/uL — ABNORMAL LOW (ref 150–400)
RBC: 3.65 MIL/uL — ABNORMAL LOW (ref 4.22–5.81)
RDW: 14.2 % (ref 11.5–15.5)
WBC: 9 10*3/uL (ref 4.0–10.5)

## 2013-12-30 LAB — BASIC METABOLIC PANEL
BUN: 34 mg/dL — AB (ref 6–23)
CHLORIDE: 99 meq/L (ref 96–112)
CO2: 30 mEq/L (ref 19–32)
Calcium: 8.9 mg/dL (ref 8.4–10.5)
Creatinine, Ser: 0.94 mg/dL (ref 0.50–1.35)
GFR calc Af Amer: 87 mL/min — ABNORMAL LOW (ref >90)
GFR calc non Af Amer: 75 mL/min — ABNORMAL LOW (ref >90)
Glucose, Bld: 95 mg/dL (ref 70–99)
Potassium: 3.8 mEq/L (ref 3.7–5.3)
Sodium: 138 mEq/L (ref 137–147)

## 2013-12-30 NOTE — Progress Notes (Signed)
[facility] PROGRESS NOTE  [name] NTI:[id number] DOB: [birth year] DOA: 12/25/2013 PCP: Cathlean Cower, MD  [name] is an 80 y.o. male with hx of bladder cancer, undergoing new treatment at outside facility, hx of afib on Xarelto (Dr Mare Ferrari), prior CVA, HTN, hyperlipidemia, s/p splenectomy, brought to the ER as he was confused.  He didn't recognized his son, and was not feeling right. He had some dark urine, and was bloody. Wife stated that he was feeling a little feverish. There were no nausea, vomiting, chest pain or shortness of breath. He had a slight nonproductive coughs. Evalaution in the ER included a UA, which showed only 2 WBC, TNTC RBCs, and a normal WBC and HB of 12 g, with normal Cr and electrolytes. His EKG showed afib, rate control. He has a low grade temp of 100F. He is now alert, orient, and recognized his family and correctly stated his son's birthday. Suspicious that he has a UTI, he was given Rocephin, and hospitalist was asked to admit him for further Tx ON day 2 he was having word finding difficulties and MRI was done which showed CVA- neuro consulted Plan for possible CIR  HPI/Subjective: Much more awake today Up in chair   Assessment/Plan: Principal Problem:   Encephalopathy Active Problems:   HYPERTENSION   Lacunar stroke   Post-splenectomy   Long term current use of anticoagulant   UTI (lower urinary tract infection)   Atrial fibrillation   Influenza with URT manifestations -Tested positive for type A influenza, started on Tamiflu x 5 days.  Acute CVA -neuro following -resume xarelto -no further work up required  Acute encephalopathy -Likely secondary to influenza and questionable UTI and now CVA - IV Rocephin x 1- now levaquin 12/31 (missd 1/1) and resumed 1/2-- end on 1/5 -check head CT as h/o CVA and similar presentation   Dysphagia -SLP -DYS diet -watch for aspiration  SIRS -With fever and heart rate of 103 this is considered to  SIRS. -Started on antibiotics empirically- no further issues  Atrial fibrillation -change to cardizem short acting as patient's meds were not able to be crushed  Hematuria -Repeat urinalysis as his urine is more clear Monitor as we resume xarelto   Code Status: Full code Family Communication: son via telephone Disposition Plan: PT/OT eval- CIR- consult in place   Consultants:  None  Procedures:  None  Antibiotics:  None   Objective: Filed Vitals:   12/30/13 0500  BP: 116/71  Pulse: 99  Temp: 97.3 F (36.3 C)  Resp: 18    Intake/Output Summary (Last 24 hours) at 12/30/13 0911 Last data filed at 12/30/13 0500  Gross per 24 hour  Intake      0 ml  Output    500 ml  Net   -500 ml   Filed Weights   12/25/13 2335  Weight: 77.747 kg (171 lb 6.4 oz)    Exam: General: speaking clearer CVS: irregular Chest: clear to auscultation bilaterally, no wheezing, Abdomen: soft nontender, nondistended, normal bowel sounds, no organomegaly Neuro- slow to respond  Data Reviewed: Basic Metabolic Panel:  Recent Labs Lab 12/26/13 0440 12/27/13 0549 12/28/13 0702 12/29/13 0521 12/30/13 0519  NA 138 130* 132* 131* 138  K 4.4 4.0 4.2 4.0 3.8  CL 98 94* 91* 93* 99  CO2 29 24 26 26 30   GLUCOSE 96 104* 103* 95 95  BUN 21 20 25* 36* 34*  CREATININE 1.05 1.01 1.02 1.03 0.94  CALCIUM 8.6 7.9* 8.6 8.6 8.9  Liver Function Tests: No results found for this basename: AST, ALT, ALKPHOS, BILITOT, PROT, ALBUMIN,  in the last 168 hours No results found for this basename: LIPASE, AMYLASE,  in the last 168 hours No results found for this basename: AMMONIA,  in the last 168 hours CBC:  Recent Labs Lab 12/25/13 1549 12/26/13 0440 12/27/13 0549 12/28/13 0702 12/30/13 0519  WBC 5.6 4.7 5.3 11.4* 9.0  NEUTROABS 4.2  --   --   --   --   HGB 12.2* 11.5* 12.0* 13.3 12.1*  HCT 35.4* 33.5* 34.4* 37.1* 35.4*  MCV 97.3 97.4 96.4 94.9 97.0  PLT 168 146* 132* 145* 128*    Cardiac Enzymes: No results found for this basename: CKTOTAL, CKMB, CKMBINDEX, TROPONINI,  in the last 168 hours BNP (last 3 results) No results found for this basename: PROBNP,  in the last 8760 hours CBG: No results found for this basename: GLUCAP,  in the last 168 hours  Micro Recent Results (from the past 240 hour(s))  CULTURE, BLOOD (ROUTINE X 2)     Status: None   Collection Time    12/25/13  7:13 PM      Result Value Range Status   Specimen Description BLOOD ARM LEFT   Final   Special Requests BOTTLES DRAWN AEROBIC AND ANAEROBIC 10CC   Final   Culture  Setup Time     Final   Value: 12/26/2013 04:56     Performed at [facility]   Culture     Final   Value:        BLOOD CULTURE RECEIVED NO GROWTH TO DATE CULTURE WILL BE HELD FOR 5 DAYS BEFORE ISSUING A FINAL NEGATIVE REPORT     Performed at [facility]   Report Status PENDING   Incomplete  CULTURE, BLOOD (ROUTINE X 2)     Status: None   Collection Time    12/25/13  7:18 PM      Result Value Range Status   Specimen Description BLOOD HAND LEFT   Final   Special Requests BOTTLES DRAWN AEROBIC AND ANAEROBIC 10CC   Final   Culture  Setup Time     Final   Value: 12/26/2013 04:56     Performed at [facility]   Culture     Final   Value:        BLOOD CULTURE RECEIVED NO GROWTH TO DATE CULTURE WILL BE HELD FOR 5 DAYS BEFORE ISSUING A FINAL NEGATIVE REPORT     Performed at [facility]   Report Status PENDING   Incomplete     Studies: No results found.  Scheduled Meds: . cholecalciferol  3,000 Units Oral Daily  . digoxin  0.125 mg Oral Daily  . diltiazem  30 mg Oral Q6H  . FLORA-Q  1 capsule Oral q morning - 10a  . levofloxacin  500 mg Oral Daily  . oseltamivir  75 mg Oral BID  . rivaroxaban  15 mg Oral Q supper  . sodium chloride  3 mL Intravenous Q12H   Continuous Infusions: . sodium chloride 20 mL/hr at 12/28/13 1450       Time spent: 35 minutes    VANN, JESSICA  [facility] Pager [phone] If 7PM-7AM, please contact night-coverage at [email or web address], password [facility] 12/30/2013, 9:11 AM  LOS: 5 days

## 2013-12-31 ENCOUNTER — Telehealth: Payer: Self-pay | Admitting: Cardiology

## 2013-12-31 MED ORDER — DILTIAZEM HCL 60 MG PO TABS
60.0000 mg | ORAL_TABLET | Freq: Four times a day (QID) | ORAL | Status: DC
  Administered 2013-12-31 – 2014-01-01 (×4): 60 mg via ORAL
  Filled 2013-12-31 (×9): qty 1

## 2013-12-31 NOTE — H&P (Signed)
Physical Medicine and Rehabilitation Admission H&P    Chief Complaint  Patient presents with  . Confusion, speech difficulty, hematuria and flu    HPI: [name] is a 80 y.o. right handed male with history of COPD, prior CVA 08/2012 with right sided weakness and dysphagia ( CIR stay) , A fib--Xarelto and  bladder cancer with ongoing treatment at [facility].  He was admitted 12/25/2013 with altered mental status and low grade fever of 100 Fahrenheit and reported hematuria. Cranial CT scan negative and MRI of the brain showed punctate acute infarct in the high left parietal lobe as well as extensive chronic ischemic changes. Patient's Xarelto held for a short time secondary to hematuria. He was started on IV rocephin due to SIRS as well as Tamiflu as he tested positive for type A influenza. He was placed on  Dysphagia 1 nectar thick liquid diet per ST recommendations. Patient with difficulty with processing with easy distractibility, as well as problems with initiation. Needs tactile cues for basic tasks and mobility.  Mentation improving and neurology recommends resuming Xarelto for embolic stroke due to A Fib. CIR recommended by rehab team and patient admitted today.    Review of Systems  Unable to perform ROS: mental acuity  HENT: Positive for hearing loss.   Respiratory: Negative for cough and shortness of breath.   Gastrointestinal: Positive for constipation.       Sore mouth.   Musculoskeletal: Positive for joint pain and myalgias.  Neurological: Positive for focal weakness.  Psychiatric/Behavioral: The patient has insomnia.     Past Medical History  Diagnosis Date  . NEOPLASM, MALIGNANT, BLADDER 04/10/2009  . HYPERLIPIDEMIA 04/10/2009  . ANEMIA-NOS 04/10/2009  . 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 ANXIETY 04/10/2009  . HYPERTENSION 04/10/2009  . Atrial fibrillation 04/10/2009  . COPD 04/10/2009  . TRANSIENT ISCHEMIC ATTACK, HX OF 04/10/2009  . Bladder cancer     Dr. Lawerance Bach  .  Lacunar stroke 04/07/2011  . Pleural effusion 04/07/2011  . 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 Stroke    Past Surgical History  Procedure Laterality Date  . Splenectomy    . Bilateral vats ablation    . Facial cancer      facial skin cancer   Family History  Problem Relation Age of Onset  . Heart disease Mother   . Arthritis Father    Social History:   Married. Independent for mobility PTA.--used to work as a Hotel manager. Needed assistance with ADL tasks. Per eports that he has never smoked. He has never used smokeless tobacco. He reports that he does not drink alcohol. His drug history is not on file.   Allergies  Allergen Reactions  . Diazepam     REACTION: agitation  . Ezetimibe-Simvastatin     unknown  . Morphine Other (See Comments)    Hallucinations.  . Sulfonamide Derivatives Hives   Medications Prior to Admission  Medication Sig Dispense Refill  . Cholecalciferol (VITAMIN D) 2000 UNITS tablet Take 2,000-3,000 Units by mouth daily. Patient takes 2000 units daily, except for taking 3000 units (1.5 tablets) on "winter days" and days when "not feeling well."      . digoxin (LANOXIN) 0.125 MG tablet Take 1 tablet (0.125 mg total) by mouth daily.  30 tablet  5  . diltiazem (DILACOR XR) 120 MG 24 hr capsule Take 120 mg by mouth daily.      . Probiotic Product (PROBIOTIC DAILY PO) Take 1 tablet by mouth every morning.       Marland Kitchen  Rivaroxaban (XARELTO) 15 MG TABS tablet Take 1 tablet (15 mg total) by mouth daily.  30 tablet  12    Home: Home Living Family/patient expects to be discharged to:: Inpatient rehab Living Arrangements: Spouse/significant other Available Help at Discharge: Available 24 hours/day Type of Home: House Home Access: Stairs to enter [employer] of Steps: 4 Entrance Stairs-Rails: Right Home Layout: One level Home Equipment: Shower seat;Hand held shower head Additional Comments: tub shower.   Lives With: Spouse   Functional History: Prior  Function Comments: wife assists with setting out clothes. Pt has bladder incontinence  Functional Status:  Mobility: Bed Mobility Bed Mobility: Supine to Sit;Sitting - Scoot to Edge of Bed Supine to Sit: 4: Min assist;With rails;HOB elevated Supine to Sit: Patient Percentage: 40% Sitting - Scoot to Edge of Bed: 4: Min assist;With rail Transfers Transfers: Sit to Stand;Stand to [employer] to Stand: 4: Min assist;With upper extremity assist;With armrests;From bed Sit to Stand: Patient Percentage: 70% Stand to Sit: 4: Min assist;With upper extremity assist;With armrests;To chair/3-in-1 Stand to Sit: Patient Percentage: 80% Stand Pivot Transfers: 1: +2 Total assist Stand Pivot Transfers: Patient Percentage: 60% Ambulation/Gait Ambulation/Gait Assistance: 3: Mod assist Ambulation Distance (Feet): 15 Feet Assistive device: Rolling walker Ambulation/Gait Assistance Details: mod assist to support trunk to maintain balance even with RW during gait.  Pt leaning posteriorly and to the right.   Gait Pattern: Step-through pattern;Lateral trunk lean to right;Narrow base of support;Shuffle;Trunk flexed Gait velocity: decreased    ADL: ADL Eating/Feeding: Minimal assistance;Other (comment) (mod vc and min tactile cues) Where Assessed - Eating/Feeding: Chair Grooming: Minimal assistance;Teeth care;Other (comment) (difficulty with sequencing and initiation) Where Assessed - Grooming: Supported sitting Upper Body Bathing: Moderate assistance Where Assessed - Upper Body Bathing: Supported sitting Lower Body Bathing: Maximal assistance Where Assessed - Lower Body Bathing: Supported sit to stand Upper Body Dressing: Maximal assistance Where Assessed - Upper Body Dressing: Supported sitting Lower Body Dressing: Maximal assistance Where Assessed - Lower Body Dressing: Supported sit to Lobbyist: Maximal assistance Toilet Transfer Method: Sit to stand Equipment Used: Gait  belt Transfers/Ambulation Related to ADLs: Max A transfers. Did not ambulate ADL Comments: educated wife on techniques to increase pt's independence with self feeding. Discussed importance of limiting distractions and only giving pt 1 step commands with time to process. PT needs occasional tactile cues to pick up spoon and initiate bite. Vc required after drinking liquid to swallow (Pt brushed teeth after lunch. Again, wife instructed on impo)  Cognition: Cognition Overall Cognitive Status: Impaired/Different from baseline Orientation Level: Oriented X4 Cognition Arousal/Alertness: Awake/alert Behavior During Therapy: WFL for tasks assessed/performed Overall Cognitive Status: Impaired/Different from baseline Area of Impairment: Memory;Awareness;Problem solving Orientation Level: Disoriented to;Situation Current Attention Level: Sustained Memory: Decreased short-term memory Following Commands: Follows multi-step commands inconsistently Safety/Judgement: Decreased awareness of deficits;Decreased awareness of safety Awareness: Emergent Problem Solving: Slow processing;Difficulty sequencing;Requires verbal cues;Requires tactile cues General Comments: pt is much more alert and oriented today, better command following and improved awareness and attention.   Physical Exam: Blood pressure 99/56, pulse 104, temperature 98 F (36.7 C), temperature source Oral, resp. rate 19, height 5\' 8"  (1.727 m), weight 77.747 kg (171 lb 6.4 oz), SpO2 98.00%. Physical Exam  Nursing note and vitals reviewed. Constitutional: He appears listless.  Thin frail appearing male. Flexed posture (unable to hold up his head)--- sleepy but arousable. Condom cath bag with dark reddish urine with small clots.   HENT:  Head: Normocephalic and atraumatic.  Eyes: Conjunctivae  are normal. Pupils are equal, round, and reactive to light.  Neck: Normal range of motion. Neck supple.  Cardiovascular: Normal rate and regular  rhythm.   Respiratory: Effort normal and breath sounds normal. No respiratory distress.  GI: Soft. Bowel sounds are normal. He exhibits no distension. There is no tenderness.  Genitourinary:  Urine in foley bag is red  Musculoskeletal: He exhibits no edema.  Bilateral knee with pain at attempts at extension.   Neurological: He appears listless.  Oriented to self only. Able to follow basic commands. Could tell me the year but nothing else even when given choices. Oriented to name. Extremely slow in processing and initiating movement moves all 4 limbs although he is slower with the right side. He is able to lift left foot off the bed but not the right (both knees are tender and limit lifting)S The rest of the foot, ankle and leg exam is normal. Ankle movement at least 3+ to 4/5. Senses pain and light touch stimulation on either side. Speech slow/slurred, low volume  Skin: Skin is warm and dry.    No results found for this or any previous visit (from the past 48 hour(s)). No results found.  Post Admission Physician Evaluation: 1. Functional deficits secondary  to left parietal infarct, ?encephalopathy as well related metabolic/infectious issues. 2. Patient is admitted to receive collaborative, interdisciplinary care between the physiatrist, rehab nursing staff, and therapy team. 3. Patient's level of medical complexity and substantial therapy needs in context of that medical necessity cannot be provided at a lesser intensity of care such as a SNF. 4. Patient has experienced substantial functional loss from his/her baseline which was documented above under the "Functional History" and "Functional Status" headings.  Judging by the patient's diagnosis, physical exam, and functional history, the patient has potential for functional progress which will result in measurable gains while on inpatient rehab.  These gains will be of substantial and practical use upon discharge  in facilitating mobility and  self-care at the household level. 5. Physiatrist will provide 24 hour management of medical needs as well as oversight of the therapy plan/treatment and provide guidance as appropriate regarding the interaction of the two. 6. 24 hour rehab nursing will assist with bladder management, bowel management, safety, skin/wound care, disease management, medication administration, pain management and patient education  and help integrate therapy concepts, techniques,education, etc. 7. PT will assess and treat for/with: Lower extremity strength, range of motion, stamina, balance, functional mobility, safety, adaptive techniques and equipment, NMR, cognitive perceptual rx, family education.   Goals are: supervision. 8. OT will assess and treat for/with: ADL's, functional mobility, safety, upper extremity strength, adaptive techniques and equipment, NMR, cognitive perceptual rx, family ed.   Goals are: supervision. 9. SLP will assess and treat for/with: speech, cognition, swallowing, education.  Goals are: supervision. 10. Case Management and Social Worker will assess and treat for psychological issues and discharge planning. 11. Team conference will be held weekly to assess progress toward goals and to determine barriers to discharge. 12. Patient will receive at least 3 hours of therapy per day at least 5 days per week. 13. ELOS: 13-18 days       14. Prognosis:  good   Medical Problem List and Plan: Acute left parietal infarct with decreased balance, possible aphasia as well as decreased cognition. Also has chronic left lower extremity weakness from right corona radiata infarct 2013  1. DVT Prophylaxis/Anticoagulation: Pharmaceutical: Xarelto 2. Pain Management: Will add Sportscreme to help with knee  stiffness.  3. Mood:  LSCW to follow for support.  4. Neuropsych: This patient is not at this time capable of making decisions on his own behalf. 5. A fib: Will continue to monitor HR with bid checks. Continue  xarelto, cardizem and lanoxin.  6. Hypotension:  On Cardizem and lanoxin for HR control. Will monitor for trends--may need cardizem changed back to 60 mg q 6hours. Need to avoid hypotension.  Will increase IVF for hydration and then change to evenings due to dysphagia diet with evidence of dehydration.  7.  SIRS due to  H Flu and questionable UTI: Has completed antibiotics and Tamiflu as of 12/31/13. 8. Dysphagia:  Continue Dysphagia 1 with nectar thick fluids. Push po fluids.  9. Pre-diabetic: Hgb A1c-5.8. Will have RD educate on CM diet.  10. Lethargy: Poor sleep last night per wife. Patient lethargic and disoriented today, particularly this afternoon. Recurrent hematuria noted--will check ua/ucs.   Also not drinking much, with elevation of BUN noted      -increase IVF, check am labs      - follow sleep pattern   Meredith Staggers, MD, [facility]  01/01/2014

## 2013-12-31 NOTE — Progress Notes (Signed)
Inpatient Rehab---I met with patient and his wife regarding possible rehab admission.  They both desire pt. To come to IP rehab.  Will contact [place] [employer] for approval.  Hopeful to admit today or tomorrow, pending AARP approval.    Gerlean Ren PT IP Admissions Coordinator [place] RN [phone]

## 2013-12-31 NOTE — Progress Notes (Signed)
Physical Therapy Treatment Patient Details Name: [name] MRN: [id number] DOB: 09/02/1927 Today's Date: 12/31/2013 Time: 9563-8756 PT Time Calculation (min): 15 min  PT Assessment / Plan / Recommendation  History of Present Illness [name] is an 80 y.o. male with a past medical history significant for HTN, hyperlipidemia, atrial fibrillation ( off xarelto due to hematuria during this admission), right lacunar stroke with residual mild left leg weakness, bladder cancer, immune thrombocytopenic purpura s/p splenectomy.  Admitted to [facility] due to confusion in the context of influenza and possible UTI.  CT negative for CVA, MRI report reveals L parietal infarct with chronic ischemic disease in basal ganglia.   PT Comments   Pt is progressing well with mobility.  He only required one person assist for gait today.  He does still have significant balance deficits in standing even with the use of a RW requiring mod assist for short distance gait.  He continues to be an excellent CIR candidate at discharge.    Follow Up Recommendations  CIR     Does the patient have the potential to tolerate intense rehabilitation    yes  Barriers to Discharge   NA      Equipment Recommendations  Rolling walker with 5" wheels    Recommendations for Other Services   NA  Frequency Min 4X/week   Progress towards PT Goals Progress towards PT goals: Progressing toward goals  Plan Current plan remains appropriate    Precautions / Restrictions Precautions Precautions: Fall Precaution Comments: positive flu   Pertinent Vitals/Pain No DOE with gait, O2 sats on RA 95% or higher throughout session. RN aware that O2 left off.      Mobility  Bed Mobility Bed Mobility: Supine to Sit;Sitting - Scoot to Edge of Bed Supine to Sit: 4: Min assist;With rails;HOB elevated Sitting - Scoot to [employer] of Bed: 4: Min assist;With rail Details for Bed Mobility Assistance: min assist to support trunk during transition to sit.  Pt  managing his own legs and pulling on bed rail to pull trunk up to sitting.   Transfers Transfers: Sit to Stand;Stand to Sit Sit to Stand: 4: Min assist;With upper extremity assist;With armrests;From bed Stand to Sit: 4: Min assist;With upper extremity assist;With armrests;To chair/3-in-1 Details for Transfer Assistance: min assist to support trunk during transitions and anteriorly progress trunk over feet.  Ambulation/Gait Ambulation/Gait Assistance: 3: Mod assist Ambulation Distance (Feet): 15 Feet Assistive device: Rolling walker Ambulation/Gait Assistance Details: mod assist to support trunk to maintain balance even with RW during gait.  Pt leaning posteriorly and to the right.   Gait Pattern: Step-through pattern;Lateral trunk lean to right;Narrow base of support;Shuffle;Trunk flexed Gait velocity: decreased    Exercises General Exercises - Upper Extremity Shoulder Flexion: AROM;Both;10 reps;Seated General Exercises - Lower Extremity Long Arc Quad: AROM;Both;10 reps;Seated Hip ABduction/ADduction: AROM;Both;10 reps;Seated (adduction against pillow for resistance) Hip Flexion/Marching: AROM;Both;10 reps;Seated Toe Raises: AROM;Both;10 reps;Seated Heel Raises: AROM;Both;10 reps;Seated Mini-Sqauts: AROM;Both;10 reps;Seated     PT Goals (current goals can now be found in the care plan section) Acute Rehab PT Goals Patient Stated Goal: get better  Visit Information  Last PT Received On: 12/31/13 Assistance Needed: +1 History of Present Illness: [name] is an 80 y.o. male with a past medical history significant for HTN, hyperlipidemia, atrial fibrillation ( off xarelto due to hematuria during this admission), right lacunar stroke with residual mild left leg weakness, bladder cancer, immune thrombocytopenic purpura s/p splenectomy.  Admitted to [facility] due to confusion in the  context of influenza and possible UTI.  CT negative for CVA, MRI report reveals L parietal infarct with chronic  ischemic disease in basal ganglia.    Subjective Data  Subjective: Pt's wife reports he is ready to get up.  He has been wanting to get up all day.  Patient Stated Goal: get better   Cognition  Cognition Arousal/Alertness: Awake/alert Behavior During Therapy: WFL for tasks assessed/performed Overall Cognitive Status: Impaired/Different from baseline Area of Impairment: Memory;Awareness;Problem solving Current Attention Level: Sustained Memory: Decreased short-term memory Following Commands: Follows multi-step commands inconsistently Safety/Judgement: Decreased awareness of deficits;Decreased awareness of safety Awareness: Emergent Problem Solving: Slow processing;Difficulty sequencing;Requires verbal cues;Requires tactile cues General Comments: pt is much more alert and oriented today, better command following and improved awareness and attention.     Balance  Balance Balance Assessed: Yes Static Sitting Balance Static Sitting - Balance Support: Bilateral upper extremity supported;Feet supported Static Sitting - Level of Assistance: 5: Stand by assistance Static Standing Balance Static Standing - Balance Support: Bilateral upper extremity supported Static Standing - Level of Assistance: 4: Min assist Dynamic Standing Balance Dynamic Standing - Balance Support: Bilateral upper extremity supported Dynamic Standing - Level of Assistance: 3: Mod assist  End of Session PT - End of Session Equipment Utilized During Treatment: Gait belt Activity Tolerance: Patient tolerated treatment well Patient left: in chair;with call bell/phone within reach;with family/visitor present Nurse Communication: Other (comment) (left O2 off due to good sats)   Kylani Wires B. [place], [place], DPT [phone]   12/31/2013, 5:18 PM

## 2013-12-31 NOTE — Progress Notes (Signed)
The Chaplain stopped by the room to visit with the patient but the patient was resting.  Chaplain Clista Bernhardt Yesenia Locurto

## 2013-12-31 NOTE — Telephone Encounter (Signed)
New message    Pt is in the hospital---brought in on tues for UTI--pt had stroke on thurs--wife want to talk to Dr Mare Ferrari ASAP

## 2013-12-31 NOTE — PMR Pre-admission (Signed)
PMR Admission Coordinator Pre-Admission Assessment  Patient: [name] is an 80 y.o., male MRN: [id number] DOB: 01-Aug-1927 Height: 5\' 8"  (172.7 cm) Weight: 77.747 kg (171 lb 6.4 oz)              Insurance Information HMO:     PPO: yes     PCP:      IPA:      80/20:      OTHER:  PRIMARY: AARP Medicare      Policy#: [id number]      Subscriber: pt. CM Name: Larwance Rote      Phone#: [phone]     Fax#: [id number] Pre-Cert#: [id number] follow up with on site Archie Patten [id number]      Employer: retired Benefits:  Phone #: [phone]     Name: checked 12/31/13 Eff. Date: 12/27/13     Deduct: none      Out of Pocket Max: $4900      Life Max: none CIR: $345/day (days 1-5)      SNF: no copay days 1-20; $155/day (days 21-52): no copay (days 53-100) Outpatient: $40 per visit     Co-Pay: no visit limit Home Health: 100%      Co-Pay: no, no visit limit DME: 80%     Co-Pay: 20%  Providers: in network benefits Medicaid Application Date:       Case Manager:   Emergency Contact Information Contact Information   Name Relation Home Work Mobile   [place] A Spouse [place] Son [phone]  [phone]     Current Medical History  Patient Admitting Diagnosis: Left parietal CVA History of Present Illness: [name] is a 80 y.o. right handed and male with history of bladder cancer undergoing  treatment outside the facility, history of atrial fibrillation on Xarelto, prior CVA August of 2013 and received inpatient rehabilitation services. Patient independent prior to admission needing some simple set up . Admitted 12/25/2013 with altered mental status and low grade fever of 100 Fahrenheit and reported hematuria. Cranial CT scan negative. MRI of the brain showed punctate acute infarct in the high left parietal lobe as well as extensive chronic ischemic changes. Patient did not receive TPA. Patient's Xarelto held for a short time secondary to hematuria and  latest urinalysis study unremarkable. Patient tested positive for type A influenza and placed on Tamiflu x5 days and drop of precautions. Dysphagia 1 nectar thick liquid diet.   Total: 0 NIH    Past Medical History  Past Medical History  Diagnosis Date  . NEOPLASM, MALIGNANT, BLADDER 04/10/2009  . HYPERLIPIDEMIA 04/10/2009  . ANEMIA-NOS 04/10/2009  . 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 ANXIETY 04/10/2009  . HYPERTENSION 04/10/2009  . Atrial fibrillation 04/10/2009  . COPD 04/10/2009  . TRANSIENT ISCHEMIC ATTACK, HX OF 04/10/2009  . Bladder cancer     Dr. Lawerance Bach  . Lacunar stroke 04/07/2011  . Pleural effusion 04/07/2011  . 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 Stroke     Family History  family history includes Arthritis in his father; Heart disease in his mother.  Prior Rehab/Hospitalizations: CIR August 2013   Current Medications  Current facility-administered medications:0.9 %  sodium chloride infusion, , Intravenous, Continuous, Geradine Girt, DO, Last Rate: 20 mL/hr at 12/28/13 1450;  acetaminophen (TYLENOL) suppository 650 mg, 650 mg, Rectal, Q6H PRN, Orvan Falconer, MD;  acetaminophen (TYLENOL) tablet 650 mg, 650 mg, Oral, Q6H PRN, Orvan Falconer, MD, 650 mg at 12/26/13 1103 cholecalciferol (VITAMIN D) tablet 3,000 Units, 3,000 Units,  Oral, Daily, Orvan Falconer, MD, 3,000 Units at 01/01/14 1055;  digoxin (LANOXIN) tablet 0.125 mg, 0.125 mg, Oral, Daily, Orvan Falconer, MD, 0.125 mg at 01/01/14 1055;  diltiazem (CARDIZEM CD) 24 hr capsule 300 mg, 300 mg, Oral, Daily, Nishant Dhungel, MD, 300 mg at 01/01/14 1055;  FLORA-Q (FLORA-Q) Capsule 1 capsule, 1 capsule, Oral, q morning - 10a, Orvan Falconer, MD, 1 capsule at 01/01/14 1055 metoprolol (LOPRESSOR) injection 5 mg, 5 mg, Intravenous, Q6H PRN, Geradine Girt, DO;  RESOURCE THICKENUP CLEAR, , Oral, PRN, Geradine Girt, DO;  Rivaroxaban (XARELTO) tablet 15 mg, 15 mg, Oral, Q supper, Eudelia Bunch, RPH, 15 mg at 12/31/13 1731;  sodium chloride 0.9 % injection 3  mL, 3 mL, Intravenous, Q12H, Orvan Falconer, MD, 3 mL at 12/30/13 2127  Patients Current Diet: Dysphagia 1 nectar thick liquids; Pt. Has specific dietary regiment per pt. Choice     Precautions / Restrictions Precautions Precautions: Fall;Other (comment) Precautions/Special Needs: Swallowing (Droplet) Precaution Comments: positive flu Restrictions Weight Bearing Restrictions: No   Prior Activity Level Community (5-7x/wk): out daily, drives farm truck Development worker, international aid / Equipment Home Assistive Devices/Equipment: Other (Comment) (shower rails) Home Equipment: Shower seat;Hand held shower head  Prior Functional Level Prior Function Level of Independence: Independent Comments: wife assists with setting out clothes. Pt has bladder incontinence  Current Functional Level Cognition  Overall Cognitive Status: Impaired/Different from baseline Current Attention Level: Sustained Orientation Level: Oriented X4 Following Commands: Follows multi-step commands inconsistently Safety/Judgement: Decreased awareness of deficits;Decreased awareness of safety General Comments: pt is much more alert and oriented today, better command following and improved awareness and attention.     Extremity Assessment (includes Sensation/Coordination)          ADLs  Eating/Feeding: Minimal assistance;Other (comment) (mod vc and min tactile cues) Where Assessed - Eating/Feeding: Chair Grooming: Minimal assistance;Teeth care;Other (comment) (difficulty with sequencing and initiation) Where Assessed - Grooming: Supported sitting Upper Body Bathing: Moderate assistance Where Assessed - Upper Body Bathing: Supported sitting Lower Body Bathing: Maximal assistance Where Assessed - Lower Body Bathing: Supported sit to stand Upper Body Dressing: Maximal assistance Where Assessed - Upper Body Dressing: Supported sitting Lower Body Dressing: Maximal assistance Where Assessed - Lower Body Dressing: Supported sit  to Lobbyist: Maximal assistance Toilet Transfer Method: Sit to stand Toileting - Water quality scientist and Hygiene: Maximal assistance Where Assessed - Best boy and Hygiene: Sit to stand from 3-in-1 or toilet Equipment Used: Gait belt Transfers/Ambulation Related to ADLs: Max A transfers. Did not ambulate ADL Comments: educated wife on techniques to increase pt's independence with self feeding. Discussed importance of limiting distractions and only giving pt 1 step commands with time to process. PT needs occasional tactile cues to pick up spoon and initiate bite. Vc required after drinking liquid to swallow (Pt brushed teeth after lunch. Again, wife instructed on impo)    Mobility  Bed Mobility: Supine to Sit;Sitting - Scoot to Edge of Bed Supine to Sit: 4: Min assist;With rails;HOB flat Supine to Sit: Patient Percentage: 40% Sitting - Scoot to Edge of Bed: 4: Min assist;With rail    Transfers  Transfers: Sit to Stand;Stand to [employer] to Stand: 4: Min assist;With upper extremity assist;From bed;From elevated surface Sit to Stand: Patient Percentage: 70% Stand to Sit: 4: Min assist;With armrests;To bed;To chair/3-in-1;With upper extremity assist Stand to Sit: Patient Percentage: 80% Stand Pivot Transfers: 1: +2 Total assist Stand Pivot Transfers: Patient Percentage: 60%    Ambulation / Gait /  Stairs / Emergency planning/management officer  Ambulation/Gait Ambulation/Gait Assistance: 4: Min Wellsite geologist (Feet): 30 Feet (15'x2) Assistive device: Rolling walker Ambulation/Gait Assistance Details: Min assist to support trunk during gait.  Pt groaning as bil legs are sore from increased mobility and exercise during yesterday's session.   Gait Pattern: Step-through pattern;Lateral trunk lean to right;Narrow base of support;Shuffle;Trunk flexed Gait velocity: decreased    Posture / Balance Static Sitting Balance Static Sitting - Balance Support: Bilateral  upper extremity supported;Feet supported Static Sitting - Level of Assistance: 5: Stand by assistance Static Sitting - Comment/# of Minutes: 10 (Is able to intermittently hold himself up for 20 second bout) Static Standing Balance Static Standing - Balance Support: Bilateral upper extremity supported Static Standing - Level of Assistance: 4: Min assist Dynamic Standing Balance Dynamic Standing - Balance Support: Bilateral upper extremity supported Dynamic Standing - Level of Assistance: 4: Min assist    Special needs/care consideration   Continuous Drip IV IVF at 20cc/hr Skin rash                              Location back, per wife's report and due to pt. Being in bed Bowel mgmt:continent; last BM 12/31/13 Bladder mgmt:currently has foley; uses condom cath at night due pt. Awakening every hour; pt. Wears Depends during the day with pad placed inside depends.  Urinary issues due to bladder CA per wife   Previous Home Environment Living Arrangements: Spouse/significant other  Lives With: Spouse Available Help at Discharge: Available 24 hours/day Type of Home: House Home Layout: One level Home Access: Stairs to enter Entrance Stairs-Rails: Right Entrance Stairs-Number of Steps: 4 Home Care Services: No Additional Comments: tub shower.   Discharge Living Setting Plans for Discharge Living Setting: Patient's home Type of Home at Discharge: House Discharge Home Layout: One level Discharge Home Access: Stairs to enter Entrance Stairs-Rails: Right Entrance Stairs-Number of Steps: 4 Does the patient have any problems obtaining your medications?: No  Social/Family/Support Systems Patient Roles: Spouse Contact Information: wife, [name],  Anticipated Caregiver: wife Anticipated Caregiver's Contact Information: [phone] cell, [phone] home Ability/Limitations of Caregiver: none Caregiver Availability: 24/7 Discharge Plan Discussed with Primary Caregiver: Yes Is  Caregiver In Agreement with Plan?: Yes Does Caregiver/Family have Issues with Lodging/Transportation while Pt is in Rehab?: No    Goals/Additional Needs Patient/Family Goal for Rehab: PT/OT supervision, 100% orientation with SLP Expected length of stay: 2 weeks Dietary Needs: Dysphagia I nectar thick liquids; pt. has specific dietary regiment per his choice Equipment Needs: TBD Special Service Needs: none Pt/Family Agrees to Admission and willing to participate: Yes Program Orientation Provided & Reviewed with Pt/Caregiver Including Roles  & Responsibilities: Yes Additional Information Needs: none   Decrease burden of Care through IP rehab admission:  none  Possible need for SNF placement upon discharge:not anticipated   Patient Condition: This patient's medical and functional status has changed since the consult dated: 12/28/13  in which the Rehabilitation Physician determined and documented that the patient's condition is appropriate for intensive rehabilitative care in an inpatient rehabilitation facility. See "History of Present Illness" (above) for medical update. Functional changes are: min/mod. Patient's medical and functional status update has been discussed with the Rehabilitation physician and patient remains appropriate for inpatient rehabilitation. Will admit to inpatient rehab today.  Preadmission Screen Completed By:  Gerlean Ren, 01/01/2014 12:30 PM ______________________________________________________________________   Discussed status with Dr. Naaman Plummer on1/6/15at 12:27 and received telephone approval for admission  today.  Admission Coordinator:  Gerlean Ren, PT  12:27/Date1/6/15

## 2013-12-31 NOTE — Telephone Encounter (Signed)
Spoke with wife and  Dr. Mare Ferrari actually has been by to see patient.

## 2013-12-31 NOTE — Progress Notes (Signed)
[facility] PROGRESS NOTE  [name] JSE:[id number] DOB: [birth year] DOA: 12/25/2013 PCP: Cathlean Cower, MD   Brief Narrative  80 y.o. male with hx of bladder cancer, undergoing new treatment at [facility], hx of afib on Xarelto (follows with Dr Mare Ferrari), prior CVA, HTN, hyperlipidemia, s/p splenectomy, brought to the ER as he was confused. He didn't recognized his son, and was not feeling right. He had some dark urine, and was bloody. Wife stated that he was feeling a little feverish. There were no nausea, vomiting, chest pain or shortness of breath. He had a slight nonproductive coughs. Evalaution in the ER included a UA, which showed only 2 WBC, TNTC RBCs, and a normal WBC and HB of 12 g, with normal Cr and electrolytes. His EKG showed afib, rate control. He had a low grade temp of 100F. He was slowly  alert, orient, and recognized his family and correctly stated his son's birthday. Suspicious that he has a UTI, he was given Rocephin, and hospitalist was asked to admit him for further Tx  ON day 2 he was having word finding difficulties and MRI was done which showed CVA ( left parietal lobe infarct) - neuro consulted     Assessment/Plan: Influenza with URT manifestations  -Tested positive for type A influenza, started on Tamiflu x 5 days.   Acute CVA  -neuro following  -resume xarelto  -no further work up required . A1C of 5.8 ( prediabetic) , lipid panel wnl. 2D echo in June with normal EF.  Acute encephalopathy  -Likely secondary to influenza and questionable UTI and now CVA  Completed abx today ( rocephin followed by levaquin --total 5 day course)  Dysphagia  -SLP recommends dys level 1 puree diet  SIRS  Fever and tachycardia on presentation. Now resolved  Atrial fibrillation  -HR elevated to 120s-130s today. i have increased his cardizem to 60 mg q 6hr today. Monitor on tele  Hematuria   urine is more clear now Monitor on  xarelto   Code Status: Full code  Family  Communication: wife at bedside Disposition Plan: CIR likely tomorrow   Consultants:  Neurology  Procedures:  None Antibiotics:  None     HPI/Subjective: Feels better. Noted to be tachy on monitor early this am  Objective: Filed Vitals:   12/31/13 0815  BP: 106/84  Pulse: 76  Temp: 97.2 F (36.2 C)  Resp: 20    Intake/Output Summary (Last 24 hours) at 12/31/13 1459 Last data filed at 12/31/13 0800  Gross per 24 hour  Intake 1297.33 ml  Output   1000 ml  Net 297.33 ml   Filed Weights   12/25/13 2335  Weight: 77.747 kg (171 lb 6.4 oz)    Exam:   General:  Elderly male  in NAD  HEENT: no pallor, moist oral mucosa  Chest: clear b/l, no added sounds  CVS: S1&S2 irregular, no murmurs, rubs or gallop  Abd: soft, NT, ND, BS+  Ext: warm, no edema,   CNS: AAOX3, non focal    Data Reviewed: Basic Metabolic Panel:  Recent Labs Lab 12/26/13 0440 12/27/13 0549 12/28/13 0702 12/29/13 0521 12/30/13 0519  NA 138 130* 132* 131* 138  K 4.4 4.0 4.2 4.0 3.8  CL 98 94* 91* 93* 99  CO2 29 24 26 26 30   GLUCOSE 96 104* 103* 95 95  BUN 21 20 25* 36* 34*  CREATININE 1.05 1.01 1.02 1.03 0.94  CALCIUM 8.6 7.9* 8.6 8.6 8.9   Liver Function Tests: No  results found for this basename: AST, ALT, ALKPHOS, BILITOT, PROT, ALBUMIN,  in the last 168 hours No results found for this basename: LIPASE, AMYLASE,  in the last 168 hours No results found for this basename: AMMONIA,  in the last 168 hours CBC:  Recent Labs Lab 12/25/13 1549 12/26/13 0440 12/27/13 0549 12/28/13 0702 12/30/13 0519  WBC 5.6 4.7 5.3 11.4* 9.0  NEUTROABS 4.2  --   --   --   --   HGB 12.2* 11.5* 12.0* 13.3 12.1*  HCT 35.4* 33.5* 34.4* 37.1* 35.4*  MCV 97.3 97.4 96.4 94.9 97.0  PLT 168 146* 132* 145* 128*   Cardiac Enzymes: No results found for this basename: CKTOTAL, CKMB, CKMBINDEX, TROPONINI,  in the last 168 hours BNP (last 3 results) No results found for this basename: PROBNP,  in  the last 8760 hours CBG: No results found for this basename: GLUCAP,  in the last 168 hours  Recent Results (from the past 240 hour(s))  CULTURE, BLOOD (ROUTINE X 2)     Status: None   Collection Time    12/25/13  7:13 PM      Result Value Range Status   Specimen Description BLOOD ARM LEFT   Final   Special Requests BOTTLES DRAWN AEROBIC AND ANAEROBIC 10CC   Final   Culture  Setup Time     Final   Value: 12/26/2013 04:56     Performed at [facility]   Culture     Final   Value:        BLOOD CULTURE RECEIVED NO GROWTH TO DATE CULTURE WILL BE HELD FOR 5 DAYS BEFORE ISSUING A FINAL NEGATIVE REPORT     Performed at [facility]   Report Status PENDING   Incomplete  CULTURE, BLOOD (ROUTINE X 2)     Status: None   Collection Time    12/25/13  7:18 PM      Result Value Range Status   Specimen Description BLOOD HAND LEFT   Final   Special Requests BOTTLES DRAWN AEROBIC AND ANAEROBIC 10CC   Final   Culture  Setup Time     Final   Value: 12/26/2013 04:56     Performed at [facility]   Culture     Final   Value:        BLOOD CULTURE RECEIVED NO GROWTH TO DATE CULTURE WILL BE HELD FOR 5 DAYS BEFORE ISSUING A FINAL NEGATIVE REPORT     Performed at [facility]   Report Status PENDING   Incomplete     Studies: No results found.  Scheduled Meds: . cholecalciferol  3,000 Units Oral Daily  . digoxin  0.125 mg Oral Daily  . diltiazem  60 mg Oral Q6H  . FLORA-Q  1 capsule Oral q morning - 10a  . levofloxacin  500 mg Oral Daily  . rivaroxaban  15 mg Oral Q supper  . sodium chloride  3 mL Intravenous Q12H   Continuous Infusions: . sodium chloride 20 mL/hr at 12/28/13 1450      Time spent: 25 minutes    Yishai Rehfeld, [place]  [facility] Pager [phone] If 7PM-7AM, please contact night-coverage at [email or web address], password [facility] 12/31/2013, 2:59 PM  LOS: 6 days

## 2014-01-01 ENCOUNTER — Inpatient Hospital Stay (HOSPITAL_COMMUNITY)
Admission: RE | Admit: 2014-01-01 | Discharge: 2014-01-05 | DRG: 945 | Disposition: A | Discharge status: Other Acute Inpatient Hospital | Payer: Medicare Other | Source: Intra-hospital | Attending: Physical Medicine & Rehabilitation | Admitting: Physical Medicine & Rehabilitation

## 2014-01-01 DIAGNOSIS — I639 Cerebral infarction, unspecified: Secondary | ICD-10-CM | POA: Diagnosis present

## 2014-01-01 DIAGNOSIS — R1310 Dysphagia, unspecified: Secondary | ICD-10-CM | POA: Diagnosis present

## 2014-01-01 DIAGNOSIS — D649 Anemia, unspecified: Secondary | ICD-10-CM

## 2014-01-01 DIAGNOSIS — F411 Generalized anxiety disorder: Secondary | ICD-10-CM

## 2014-01-01 DIAGNOSIS — I6340 Cerebral infarction due to embolism of unspecified cerebral artery: Secondary | ICD-10-CM | POA: Diagnosis present

## 2014-01-01 DIAGNOSIS — R4182 Altered mental status, unspecified: Secondary | ICD-10-CM | POA: Diagnosis present

## 2014-01-01 DIAGNOSIS — R6510 Systemic inflammatory response syndrome (SIRS) of non-infectious origin without acute organ dysfunction: Secondary | ICD-10-CM | POA: Diagnosis present

## 2014-01-01 DIAGNOSIS — I6330 Cerebral infarction due to thrombosis of unspecified cerebral artery: Secondary | ICD-10-CM

## 2014-01-01 DIAGNOSIS — J111 Influenza due to unidentified influenza virus with other respiratory manifestations: Principal | ICD-10-CM | POA: Diagnosis present

## 2014-01-01 DIAGNOSIS — I69991 Dysphagia following unspecified cerebrovascular disease: Secondary | ICD-10-CM | POA: Diagnosis not present

## 2014-01-01 DIAGNOSIS — Z8551 Personal history of malignant neoplasm of bladder: Secondary | ICD-10-CM

## 2014-01-01 DIAGNOSIS — I4891 Unspecified atrial fibrillation: Secondary | ICD-10-CM | POA: Diagnosis present

## 2014-01-01 DIAGNOSIS — R351 Nocturia: Secondary | ICD-10-CM | POA: Diagnosis present

## 2014-01-01 DIAGNOSIS — Z5189 Encounter for other specified aftercare: Principal | ICD-10-CM

## 2014-01-01 DIAGNOSIS — R319 Hematuria, unspecified: Secondary | ICD-10-CM | POA: Diagnosis present

## 2014-01-01 DIAGNOSIS — Z8249 Family history of ischemic heart disease and other diseases of the circulatory system: Secondary | ICD-10-CM

## 2014-01-01 DIAGNOSIS — C679 Malignant neoplasm of bladder, unspecified: Secondary | ICD-10-CM | POA: Diagnosis present

## 2014-01-01 DIAGNOSIS — R29898 Other symptoms and signs involving the musculoskeletal system: Secondary | ICD-10-CM | POA: Diagnosis present

## 2014-01-01 DIAGNOSIS — J449 Chronic obstructive pulmonary disease, unspecified: Secondary | ICD-10-CM | POA: Diagnosis present

## 2014-01-01 DIAGNOSIS — K5900 Constipation, unspecified: Secondary | ICD-10-CM | POA: Diagnosis present

## 2014-01-01 DIAGNOSIS — J4489 Other specified chronic obstructive pulmonary disease: Secondary | ICD-10-CM | POA: Diagnosis present

## 2014-01-01 DIAGNOSIS — I10 Essential (primary) hypertension: Secondary | ICD-10-CM | POA: Diagnosis present

## 2014-01-01 DIAGNOSIS — Z85828 Personal history of other malignant neoplasm of skin: Secondary | ICD-10-CM | POA: Diagnosis not present

## 2014-01-01 DIAGNOSIS — I69998 Other sequelae following unspecified cerebrovascular disease: Secondary | ICD-10-CM

## 2014-01-01 DIAGNOSIS — R7303 Prediabetes: Secondary | ICD-10-CM | POA: Diagnosis present

## 2014-01-01 LAB — CULTURE, BLOOD (ROUTINE X 2)
Culture: NO GROWTH
Culture: NO GROWTH

## 2014-01-01 MED ORDER — SODIUM CHLORIDE 0.9 % IJ SOLN
3.0000 mL | Freq: Two times a day (BID) | INTRAMUSCULAR | Status: DC
  Administered 2014-01-01 – 2014-01-05 (×5): 3 mL via INTRAVENOUS

## 2014-01-01 MED ORDER — DIPHENHYDRAMINE HCL 12.5 MG/5ML PO ELIX
12.5000 mg | ORAL_SOLUTION | Freq: Four times a day (QID) | ORAL | Status: DC | PRN
  Filled 2014-01-01: qty 10

## 2014-01-01 MED ORDER — DIGOXIN 125 MCG PO TABS
0.1250 mg | ORAL_TABLET | Freq: Every day | ORAL | Status: DC
  Administered 2014-01-02 – 2014-01-03 (×2): 0.125 mg via ORAL
  Filled 2014-01-01 (×4): qty 1

## 2014-01-01 MED ORDER — RESOURCE THICKENUP CLEAR PO POWD: 1.0000 | ORAL | Status: DC | PRN

## 2014-01-01 MED ORDER — ACETAMINOPHEN 325 MG PO TABS: 325.0000 mg | ORAL_TABLET | ORAL | Status: DC | PRN

## 2014-01-01 MED ORDER — GUAIFENESIN-DM 100-10 MG/5ML PO SYRP: 5.0000 mL | ORAL_SOLUTION | Freq: Four times a day (QID) | ORAL | Status: DC | PRN

## 2014-01-01 MED ORDER — FLORA-Q PO CAPS
1.0000 | ORAL_CAPSULE | Freq: Every morning | ORAL | Status: DC
  Administered 2014-01-02 – 2014-01-05 (×4): 1 via ORAL
  Filled 2014-01-01 (×5): qty 1

## 2014-01-01 MED ORDER — SENNOSIDES-DOCUSATE SODIUM 8.6-50 MG PO TABS: 1.0000 | ORAL_TABLET | Freq: Every evening | ORAL | Status: DC | PRN

## 2014-01-01 MED ORDER — SODIUM CHLORIDE 0.9 % IV SOLN
INTRAVENOUS | Status: DC
  Administered 2014-01-01 – 2014-01-03 (×3): via INTRAVENOUS

## 2014-01-01 MED ORDER — FLEET ENEMA 7-19 GM/118ML RE ENEM
1.0000 | ENEMA | Freq: Once | RECTAL | Status: AC | PRN
  Filled 2014-01-01: qty 1

## 2014-01-01 MED ORDER — POTASSIUM CHLORIDE 2 MEQ/ML IV SOLN
Freq: Every day | INTRAVENOUS | Status: DC
  Filled 2014-01-01 (×2): qty 1000

## 2014-01-01 MED ORDER — VITAMIN D3 25 MCG (1000 UNIT) PO TABS
3000.0000 [IU] | ORAL_TABLET | Freq: Every day | ORAL | Status: DC
  Administered 2014-01-02 – 2014-01-05 (×4): 3000 [IU] via ORAL
  Filled 2014-01-01 (×5): qty 3

## 2014-01-01 MED ORDER — NYSTATIN 100000 UNIT/GM EX POWD
Freq: Three times a day (TID) | CUTANEOUS | Status: DC
  Administered 2014-01-02 – 2014-01-05 (×10): via TOPICAL
  Filled 2014-01-01: qty 15

## 2014-01-01 MED ORDER — ALUM & MAG HYDROXIDE-SIMETH 200-200-20 MG/5ML PO SUSP: 30.0000 mL | ORAL | Status: DC | PRN

## 2014-01-01 MED ORDER — PROCHLORPERAZINE 25 MG RE SUPP
12.5000 mg | Freq: Four times a day (QID) | RECTAL | Status: DC | PRN
  Filled 2014-01-01: qty 1

## 2014-01-01 MED ORDER — PROCHLORPERAZINE MALEATE 5 MG PO TABS
5.0000 mg | ORAL_TABLET | Freq: Four times a day (QID) | ORAL | Status: DC | PRN
  Filled 2014-01-01: qty 2

## 2014-01-01 MED ORDER — RIVAROXABAN 15 MG PO TABS
15.0000 mg | ORAL_TABLET | Freq: Every day | ORAL | Status: DC
  Administered 2014-01-01 – 2014-01-04 (×4): 15 mg via ORAL
  Filled 2014-01-01 (×5): qty 1

## 2014-01-01 MED ORDER — BISACODYL 10 MG RE SUPP: 10.0000 mg | Freq: Every day | RECTAL | Status: DC | PRN

## 2014-01-01 MED ORDER — NYSTATIN 100000 UNIT/ML MT SUSP
5.0000 mL | Freq: Four times a day (QID) | OROMUCOSAL | Status: DC
  Administered 2014-01-01 – 2014-01-05 (×12): 500000 [IU] via ORAL
  Filled 2014-01-01 (×19): qty 5

## 2014-01-01 MED ORDER — RESOURCE THICKENUP CLEAR PO POWD
ORAL | Status: DC | PRN
  Filled 2014-01-01: qty 125

## 2014-01-01 MED ORDER — DILTIAZEM HCL ER COATED BEADS 300 MG PO CP24: 300.0000 mg | ORAL_CAPSULE | Freq: Every day | ORAL | Status: DC

## 2014-01-01 MED ORDER — TRAZODONE HCL 50 MG PO TABS: 25.0000 mg | ORAL_TABLET | Freq: Every evening | ORAL | Status: DC | PRN

## 2014-01-01 MED ORDER — PROCHLORPERAZINE EDISYLATE 5 MG/ML IJ SOLN
5.0000 mg | Freq: Four times a day (QID) | INTRAMUSCULAR | Status: DC | PRN
  Filled 2014-01-01: qty 2

## 2014-01-01 MED ORDER — MUSCLE RUB 10-15 % EX CREA
TOPICAL_CREAM | Freq: Three times a day (TID) | CUTANEOUS | Status: DC
  Administered 2014-01-01 – 2014-01-04 (×8): via TOPICAL
  Filled 2014-01-01: qty 85

## 2014-01-01 MED ORDER — METOPROLOL TARTRATE 1 MG/ML IV SOLN
5.0000 mg | Freq: Four times a day (QID) | INTRAVENOUS | Status: DC | PRN
  Administered 2014-01-05: 5 mg via INTRAVENOUS
  Filled 2014-01-01 (×2): qty 5

## 2014-01-01 MED ORDER — DILTIAZEM HCL ER COATED BEADS 300 MG PO CP24
300.0000 mg | ORAL_CAPSULE | Freq: Every day | ORAL | Status: DC
Start: 2014-01-01 — End: 2014-01-01
  Administered 2014-01-01: 300 mg via ORAL
  Filled 2014-01-01: qty 1

## 2014-01-01 NOTE — Progress Notes (Signed)
REPORT GIVEN TO REHAB NURSE.  NO QUESTIONS ASKED. TRANSFERRED TO REHAB.

## 2014-01-01 NOTE — Plan of Care (Signed)
Problem: Phase III Progression Outcomes Goal: Discharge plan remains appropriate-arrangements made Outcome: Completed/Met Date Met:  01/01/14 PATIENT GOING TO IN PATIENT REHAB

## 2014-01-01 NOTE — Interval H&P Note (Signed)
[name] was admitted today to Inpatient Rehabilitation with the diagnosis of left parietal infarct  The patient's history has been reviewed, patient examined, and there is no change in status.  Patient continues to be appropriate for intensive inpatient rehabilitation.  I have reviewed the patient's chart and labs.  Questions were answered to the patient's satisfaction.  [name] T 01/01/2014, 9:32 PM

## 2014-01-01 NOTE — Discharge Instructions (Signed)
Ischemic Stroke Blood carries oxygen to all areas of your body. A stroke happens when your blood does not flow to your brain like normal. If this happens, your brain will not get the oxygen it needs and brain tissue will die. This is an emergency. Problems (symptoms) of a stroke usually happen suddenly. You may notice them when you wake up. They can include:  Loss of feeling or weakness on one side of the body (face, arm, leg).  Feeling confused.  Trouble talking or understanding.  Trouble seeing.  Trouble walking.  Feeling dizzy.  Loss of balance or coordination.  Severe headache without a cause.  Trouble reading or writing. Get help within 3 4 hours of when your problems first started. If you do not know when your problems started, get help as soon as you can. This is important.  RISK FACTORS  Risk factors are things that make it more likely for you to have a stroke. These things include:  High blood pressure (hypertension).  High cholesterol.  Diabetes.  Heart disease.  Having a buildup of fatty deposits in the blood vessels.  Having an abnormal heart rhythm (atrial fibrillation).  Being very overweight (obese).  Smoking.  Taking birth control pills, especially if you smoke.  Not being active.  Having a diet high in fats, salt, and calories.  Drinking too much alcohol.  Using illegal drugs.  Being African American.  Being over the age of 24.  Having a family history of stroke.  Having a history of blood clots, stroke, warning stroke (transient ischemic attack, TIA), or heart attack.  Sickle cell disease. HOME CARE  Take all medicines exactly as told by your doctor. Understand all your medicine instructions.  You may need to take aspirin or warfarin medicine. Take warfarin exactly as told.  Taking too much or too little warfarin is dangerous. Get regular blood tests as told, including the PT and INR tests. The test results help your doctor adjust  your dose of warfarin. Your PT and INR levels must be done as often as told by your doctor.  Food can cause problems with warfarin and affect the results of your blood tests. This is true for foods high in vitamin K, such as spinach, kale, broccoli, cabbage, collard and turnip greens, brussels sprouts, peas, cauliflower, seaweed, and parsley, as well as beef and pork liver, green tea, and soybean oil. Eat the same amount of food high in vitamin K. Avoid major changes in your diet. Tell your doctor before changing your diet. Talk to a food specialist (dietitian) if you have questions.  Many medicines can cause problems with warfarin and affect your PT and INR test results. Tell your doctor about all medicines you take. This includes vitamins and dietary pills (supplements). Be careful with aspirin and medicines that relieve redness, soreness, and puffiness (inflammation). Do not take or stop medicines unless your doctor tells you to.  Warfarin can cause a lot of bruising or bleeding. Hold pressure over cuts for longer than normal. Talk to your doctor about other side effects of warfarin.  Avoid sports or activities that may cause injury or bleeding.  Be careful when you shave, floss your teeth, or use sharp objects.  Avoid alcoholic drinks or drink very little alcohol while taking warfarin. Tell your doctor if you change how much alcohol you drink.  Tell your dentist and other doctors that you take warfarin before procedures.  If you are able to swallow, eat healthy foods. Eat 5  or more servings of fruits and vegetables a day. Eat soft foods, pureed foods, or eat small bites of food so you do not choke.  Follow your diet program as told, if you are given one.  Keep a healthy weight.  Stay active. Try to get at least 30 minutes of activity on most or all days.  Do not smoke.  Limit how much alcohol you drink even if you are not taking warfarin. Moderate alcohol use is:  No more than 2  drinks each day for men.  No more than 1 drink each day for women who are not pregnant.  Stop abusing drugs.  Keep your home safe so you do not fall. Try:  Putting grab bars in the bedroom and bathroom.  Raising toilet seats.  Putting a seat in the shower.  Go to therapy sessions (physical, occupational, and speech) as told by your doctor.  Use a walker or cane at all times if told to do so.  Keep all doctor visits as told. GET HELP IF:  Your personality changes.  You have trouble swallowing.  You are seeing two of everything.  You are dizzy.  You have a fever.  Your skin starts to break down. GET HELP RIGHT AWAY IF:  The symptoms below may be a sign of an emergency. Do not wait to see if the symptoms go away. Call for help (911 in [place] Do not drive yourself to the hospital.  You have sudden weakness or numbness on the face, arm, or leg (especially on one side of the body).  You have sudden trouble walking or moving your arms or legs.  You have sudden confusion.  You have trouble talking or understanding.  You have sudden trouble seeing in one or both eyes.  You lose your balance or your movements are not smooth.  You have a sudden, severe headache with no known cause.  You have new chest pain or you feel your heart beating in a unsteady way.  You are partly or totally unaware of what is going on around you. Document Released: 12/02/2011 Document Revised: 08/15/2013 Document Reviewed: 07/23/2012 [facility] Patient Information 2014 [place].

## 2014-01-01 NOTE — Progress Notes (Signed)
Ip. Rehab--I have received insurance approval for admission to rehab today.  Have notified pt's bedside RN, Dr. Clementeen Graham and the RN case manager .    [name]. PT [phone]

## 2014-01-01 NOTE — Progress Notes (Signed)
Patient ID: [name], male   DOB: [birth year], 80 y.o.   MRN: [id number] Patient admitted to 4MW06 via wheelchair, escorted by nursing staff and spouse.  Patient is alert and answering questions appropriately.  Disoriented only to situation.  Wife at bedside.  Patient and spouse verbalized understanding of general rehab process, declined to go into full detail since they have been here before.  Condom catheter in place, redness to scrotum and penis noted.  IV intact with old drainage noted.  VSS.  Appears to be in no immediate distress at this time.  Brita Romp, RN

## 2014-01-01 NOTE — H&P (View-Only) (Signed)
Physical Medicine and Rehabilitation Admission H&P    Chief Complaint  Patient presents with  . Confusion, speech difficulty, hematuria and flu    HPI: [name] is a 80 y.o. right handed male with history of COPD, prior CVA 08/2012 with right sided weakness and dysphagia ( CIR stay) , A fib--Xarelto and  bladder cancer with ongoing treatment at [facility].  He was admitted 12/25/2013 with altered mental status and low grade fever of 100 Fahrenheit and reported hematuria. Cranial CT scan negative and MRI of the brain showed punctate acute infarct in the high left parietal lobe as well as extensive chronic ischemic changes. Patient's Xarelto held for a short time secondary to hematuria. He was started on IV rocephin due to SIRS as well as Tamiflu as he tested positive for type A influenza. He was placed on  Dysphagia 1 nectar thick liquid diet per ST recommendations. Patient with difficulty with processing with easy distractibility, as well as problems with initiation. Needs tactile cues for basic tasks and mobility.  Mentation improving and neurology recommends resuming Xarelto for embolic stroke due to A Fib. CIR recommended by rehab team and patient admitted today.    Review of Systems  Unable to perform ROS: mental acuity  HENT: Positive for hearing loss.   Respiratory: Negative for cough and shortness of breath.   Gastrointestinal: Positive for constipation.       Sore mouth.   Musculoskeletal: Positive for joint pain and myalgias.  Neurological: Positive for focal weakness.  Psychiatric/Behavioral: The patient has insomnia.     Past Medical History  Diagnosis Date  . NEOPLASM, MALIGNANT, BLADDER 04/10/2009  . HYPERLIPIDEMIA 04/10/2009  . ANEMIA-NOS 04/10/2009  . 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 ANXIETY 04/10/2009  . HYPERTENSION 04/10/2009  . Atrial fibrillation 04/10/2009  . COPD 04/10/2009  . TRANSIENT ISCHEMIC ATTACK, HX OF 04/10/2009  . Bladder cancer     Dr. Lawerance Bach  .  Lacunar stroke 04/07/2011  . Pleural effusion 04/07/2011  . 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 Stroke    Past Surgical History  Procedure Laterality Date  . Splenectomy    . Bilateral vats ablation    . Facial cancer      facial skin cancer   Family History  Problem Relation Age of Onset  . Heart disease Mother   . Arthritis Father    Social History:   Married. Independent for mobility PTA.--used to work as a Hotel manager. Needed assistance with ADL tasks. Per eports that he has never smoked. He has never used smokeless tobacco. He reports that he does not drink alcohol. His drug history is not on file.   Allergies  Allergen Reactions  . Diazepam     REACTION: agitation  . Ezetimibe-Simvastatin     unknown  . Morphine Other (See Comments)    Hallucinations.  . Sulfonamide Derivatives Hives   Medications Prior to Admission  Medication Sig Dispense Refill  . Cholecalciferol (VITAMIN D) 2000 UNITS tablet Take 2,000-3,000 Units by mouth daily. Patient takes 2000 units daily, except for taking 3000 units (1.5 tablets) on "winter days" and days when "not feeling well."      . digoxin (LANOXIN) 0.125 MG tablet Take 1 tablet (0.125 mg total) by mouth daily.  30 tablet  5  . diltiazem (DILACOR XR) 120 MG 24 hr capsule Take 120 mg by mouth daily.      . Probiotic Product (PROBIOTIC DAILY PO) Take 1 tablet by mouth every morning.       Marland Kitchen  Rivaroxaban (XARELTO) 15 MG TABS tablet Take 1 tablet (15 mg total) by mouth daily.  30 tablet  12    Home: Home Living Family/patient expects to be discharged to:: Inpatient rehab Living Arrangements: Spouse/significant other Available Help at Discharge: Available 24 hours/day Type of Home: House Home Access: Stairs to enter [employer] of Steps: 4 Entrance Stairs-Rails: Right Home Layout: One level Home Equipment: Shower seat;Hand held shower head Additional Comments: tub shower.   Lives With: Spouse   Functional History: Prior  Function Comments: wife assists with setting out clothes. Pt has bladder incontinence  Functional Status:  Mobility: Bed Mobility Bed Mobility: Supine to Sit;Sitting - Scoot to Edge of Bed Supine to Sit: 4: Min assist;With rails;HOB elevated Supine to Sit: Patient Percentage: 40% Sitting - Scoot to Edge of Bed: 4: Min assist;With rail Transfers Transfers: Sit to Stand;Stand to [employer] to Stand: 4: Min assist;With upper extremity assist;With armrests;From bed Sit to Stand: Patient Percentage: 70% Stand to Sit: 4: Min assist;With upper extremity assist;With armrests;To chair/3-in-1 Stand to Sit: Patient Percentage: 80% Stand Pivot Transfers: 1: +2 Total assist Stand Pivot Transfers: Patient Percentage: 60% Ambulation/Gait Ambulation/Gait Assistance: 3: Mod assist Ambulation Distance (Feet): 15 Feet Assistive device: Rolling walker Ambulation/Gait Assistance Details: mod assist to support trunk to maintain balance even with RW during gait.  Pt leaning posteriorly and to the right.   Gait Pattern: Step-through pattern;Lateral trunk lean to right;Narrow base of support;Shuffle;Trunk flexed Gait velocity: decreased    ADL: ADL Eating/Feeding: Minimal assistance;Other (comment) (mod vc and min tactile cues) Where Assessed - Eating/Feeding: Chair Grooming: Minimal assistance;Teeth care;Other (comment) (difficulty with sequencing and initiation) Where Assessed - Grooming: Supported sitting Upper Body Bathing: Moderate assistance Where Assessed - Upper Body Bathing: Supported sitting Lower Body Bathing: Maximal assistance Where Assessed - Lower Body Bathing: Supported sit to stand Upper Body Dressing: Maximal assistance Where Assessed - Upper Body Dressing: Supported sitting Lower Body Dressing: Maximal assistance Where Assessed - Lower Body Dressing: Supported sit to Lobbyist: Maximal assistance Toilet Transfer Method: Sit to stand Equipment Used: Gait  belt Transfers/Ambulation Related to ADLs: Max A transfers. Did not ambulate ADL Comments: educated wife on techniques to increase pt's independence with self feeding. Discussed importance of limiting distractions and only giving pt 1 step commands with time to process. PT needs occasional tactile cues to pick up spoon and initiate bite. Vc required after drinking liquid to swallow (Pt brushed teeth after lunch. Again, wife instructed on impo)  Cognition: Cognition Overall Cognitive Status: Impaired/Different from baseline Orientation Level: Oriented X4 Cognition Arousal/Alertness: Awake/alert Behavior During Therapy: WFL for tasks assessed/performed Overall Cognitive Status: Impaired/Different from baseline Area of Impairment: Memory;Awareness;Problem solving Orientation Level: Disoriented to;Situation Current Attention Level: Sustained Memory: Decreased short-term memory Following Commands: Follows multi-step commands inconsistently Safety/Judgement: Decreased awareness of deficits;Decreased awareness of safety Awareness: Emergent Problem Solving: Slow processing;Difficulty sequencing;Requires verbal cues;Requires tactile cues General Comments: pt is much more alert and oriented today, better command following and improved awareness and attention.   Physical Exam: Blood pressure 99/56, pulse 104, temperature 98 F (36.7 C), temperature source Oral, resp. rate 19, height 5\' 8"  (1.727 m), weight 77.747 kg (171 lb 6.4 oz), SpO2 98.00%. Physical Exam  Nursing note and vitals reviewed. Constitutional: He appears listless.  Thin frail appearing male. Flexed posture (unable to hold up his head)--- sleepy but arousable. Condom cath bag with dark reddish urine with small clots.   HENT:  Head: Normocephalic and atraumatic.  Eyes: Conjunctivae  are normal. Pupils are equal, round, and reactive to light.  Neck: Normal range of motion. Neck supple.  Cardiovascular: Normal rate and regular  rhythm.   Respiratory: Effort normal and breath sounds normal. No respiratory distress.  GI: Soft. Bowel sounds are normal. He exhibits no distension. There is no tenderness.  Genitourinary:  Urine in foley bag is red  Musculoskeletal: He exhibits no edema.  Bilateral knee with pain at attempts at extension.   Neurological: He appears listless.  Oriented to self only. Able to follow basic commands. Could tell me the year but nothing else even when given choices. Oriented to name. Extremely slow in processing and initiating movement moves all 4 limbs although he is slower with the right side. He is able to lift left foot off the bed but not the right (both knees are tender and limit lifting)S The rest of the foot, ankle and leg exam is normal. Ankle movement at least 3+ to 4/5. Senses pain and light touch stimulation on either side. Speech slow/slurred, low volume  Skin: Skin is warm and dry.    No results found for this or any previous visit (from the past 48 hour(s)). No results found.  Post Admission Physician Evaluation: 1. Functional deficits secondary  to left parietal infarct, ?encephalopathy as well related metabolic/infectious issues. 2. Patient is admitted to receive collaborative, interdisciplinary care between the physiatrist, rehab nursing staff, and therapy team. 3. Patient's level of medical complexity and substantial therapy needs in context of that medical necessity cannot be provided at a lesser intensity of care such as a SNF. 4. Patient has experienced substantial functional loss from his/her baseline which was documented above under the "Functional History" and "Functional Status" headings.  Judging by the patient's diagnosis, physical exam, and functional history, the patient has potential for functional progress which will result in measurable gains while on inpatient rehab.  These gains will be of substantial and practical use upon discharge  in facilitating mobility and  self-care at the household level. 5. Physiatrist will provide 24 hour management of medical needs as well as oversight of the therapy plan/treatment and provide guidance as appropriate regarding the interaction of the two. 6. 24 hour rehab nursing will assist with bladder management, bowel management, safety, skin/wound care, disease management, medication administration, pain management and patient education  and help integrate therapy concepts, techniques,education, etc. 7. PT will assess and treat for/with: Lower extremity strength, range of motion, stamina, balance, functional mobility, safety, adaptive techniques and equipment, NMR, cognitive perceptual rx, family education.   Goals are: supervision. 8. OT will assess and treat for/with: ADL's, functional mobility, safety, upper extremity strength, adaptive techniques and equipment, NMR, cognitive perceptual rx, family ed.   Goals are: supervision. 9. SLP will assess and treat for/with: speech, cognition, swallowing, education.  Goals are: supervision. 10. Case Management and Social Worker will assess and treat for psychological issues and discharge planning. 11. Team conference will be held weekly to assess progress toward goals and to determine barriers to discharge. 12. Patient will receive at least 3 hours of therapy per day at least 5 days per week. 13. ELOS: 13-18 days       14. Prognosis:  good   Medical Problem List and Plan: Acute left parietal infarct with decreased balance, possible aphasia as well as decreased cognition. Also has chronic left lower extremity weakness from right corona radiata infarct 2013  1. DVT Prophylaxis/Anticoagulation: Pharmaceutical: Xarelto 2. Pain Management: Will add Sportscreme to help with knee  stiffness.  3. Mood:  LSCW to follow for support.  4. Neuropsych: This patient is not at this time capable of making decisions on his own behalf. 5. A fib: Will continue to monitor HR with bid checks. Continue  xarelto, cardizem and lanoxin.  6. Hypotension:  On Cardizem and lanoxin for HR control. Will monitor for trends--may need cardizem changed back to 60 mg q 6hours. Need to avoid hypotension.  Will increase IVF for hydration and then change to evenings due to dysphagia diet with evidence of dehydration.  7.  SIRS due to  H Flu and questionable UTI: Has completed antibiotics and Tamiflu as of 12/31/13. 8. Dysphagia:  Continue Dysphagia 1 with nectar thick fluids. Push po fluids.  9. Pre-diabetic: Hgb A1c-5.8. Will have RD educate on CM diet.  10. Lethargy: Poor sleep last night per wife. Patient lethargic and disoriented today, particularly this afternoon. Recurrent hematuria noted--will check ua/ucs.   Also not drinking much, with elevation of BUN noted      -increase IVF, check am labs      - follow sleep pattern   Meredith Staggers, MD, [facility]  01/01/2014

## 2014-01-01 NOTE — Discharge Summary (Signed)
Physician Discharge Summary  [name] [id number] DOB: 1927/03/30 DOA: 12/25/2013  PCP: Cathlean Cower, MD  Admit date: 12/25/2013 Discharge date: 01/01/2014  Time spent: 40 minutes  Recommendations for Outpatient Follow-up:  1. D/C TO INPATIENT REHAB  Discharge Diagnoses:  Principal Problem:   Encephalopathy  Active Problems:   Lacunar stroke   HYPERTENSION   Post-splenectomy   Long term current use of anticoagulant   CVA (cerebral vascular accident)-Acute right small nonhemorrhagic infarct seen on MRI   UTI (lower urinary tract infection)   Atrial fibrillation   Influenza with other respiratory manifestations   Prediabetes   Discharge Condition: FAIR  Diet recommendation: dys level 1  Filed Weights   12/25/13 2335  Weight: 77.747 kg (171 lb 6.4 oz)    History of present illness:  80 y.o. male with hx of bladder cancer, undergoing new treatment at [facility], hx of afib on Xarelto (follows with Dr Mare Ferrari), prior CVA, HTN, hyperlipidemia, s/p splenectomy, brought to the ER as he was confused. He didn't recognized his son, and was not feeling right. He had some dark urine, and was bloody. Wife stated that he was feeling a little feverish. There were no nausea, vomiting, chest pain or shortness of breath. He had a slight nonproductive coughs. Evalaution in the ER included a UA, which showed only 2 WBC, TNTC RBCs, and a normal WBC and HB of 12 g, with normal Cr and electrolytes. His EKG showed afib, rate control. He had a low grade temp of 100F. He was slowly alert, orient, and recognized his family and correctly stated his son's birthday. Suspicious that he has a UTI, he was given Rocephin, and hospitalist was asked to admit him for further Tx  ON day 2 he was having word finding difficulties and MRI was done which showed CVA ( left parietal lobe infarct) - neuro consulted      Hospital Course:   Influenza with URT manifestations  -Tested positive for type A influenza,  started on Tamiflu x 5 days. ( completed treatment)  Acute CVA ( left parietal lobe infarct) -neuro following  -resumed xarelto . Was being held off and on in past for hematuria. -no further work up required . A1C of 5.8 ( prediabetic) , lipid panel wnl. 2D echo in June with normal EF. -no residual weakness now.   Acute encephalopathy  -Likely secondary to influenza and questionable UTI and now CVA  Completed abx on 1/5 ( rocephin followed by levaquin --total 5 day course)   Dysphagia  -SLP recommends dys level 1 puree diet   SIRS  Fever and tachycardia on presentation. Now resolved   Atrial fibrillation  -HR elevated to 120s-130s on 1/5  i have increased his cardizem to 60 mg q 6hr and rate better controlled. Will change dose to 300 mg xr daily ( was on 120 mg daily at home).contineu digoxin.  Please consult cardiology for any concerns.  Hematuria with hx of bladder ca urine is more clear now  Monitor on xarelto  Followed at [facility]  Code Status: Full code  Family Communication: wife at bedside  Disposition Plan: CIR  Consultants:  Neurology    Procedures:  None Antibiotics:  Completed on 1/5 ( rocephin/ levaquin)     Discharge Exam: Filed Vitals:   01/01/14 0930  BP: 99/56  Pulse: 104  Temp: 98 F (36.7 C)  Resp: 19   General: Elderly male in NAD  HEENT: no pallor, moist oral mucosa  Chest: clear b/l, no added  sounds  CVS: S1&S2 irregular, no murmurs, rubs or gallop  Abd: soft, NT, ND, BS+ , condom catheter with dark urine Ext: warm, no edema,  CNS: AAOX3, non focal   Discharge Instructions   Future Appointments Provider Department Dept Phone   03/08/2014 10:15 AM Darlin Coco, MD [place] [facility] [phone]       Medication List    STOP taking these medications       diltiazem 120 MG 24 hr capsule  Commonly known as:  DILACOR XR      TAKE these medications       digoxin 0.125 MG tablet  Commonly known as:  LANOXIN   Take 1 tablet (0.125 mg total) by mouth daily.     diltiazem 300 MG 24 hr capsule  Commonly known as:  CARDIZEM CD  Take 1 capsule (300 mg total) by mouth daily.     PROBIOTIC DAILY PO  Take 1 tablet by mouth every morning.     RESOURCE THICKENUP CLEAR Powd  Take 120 g by mouth as needed.     Rivaroxaban 15 MG Tabs tablet  Commonly known as:  XARELTO  Take 1 tablet (15 mg total) by mouth daily.     Vitamin D 2000 UNITS tablet  Take 2,000-3,000 Units by mouth daily. Patient takes 2000 units daily, except for taking 3000 units (1.5 tablets) on "winter days" and days when "not feeling well."       Allergies  Allergen Reactions  . Diazepam     REACTION: agitation  . Ezetimibe-Simvastatin     unknown  . Morphine Other (See Comments)    Hallucinations.  . Sulfonamide Derivatives Hives       Follow-up Information   Follow up with SETHI,PRAMODKUMAR P, MD. Schedule an appointment as soon as possible for a visit in 2 months.   Specialties:  Neurology, Radiology   Contact information:   [address] [place] [address] [phone]        The results of significant diagnostics from this hospitalization (including imaging, microbiology, ancillary and laboratory) are listed below for reference.    Significant Diagnostic Studies: Dg Chest 2 View  12/25/2013   CLINICAL DATA:  Weakness  EXAM: CHEST  2 VIEW  COMPARISON:  08/21/2012  FINDINGS: Heart size and mediastinal contours are normal. The vascular pattern is normal. Bilateral pleural apical thickening is stable. There is no consolidation or effusion. Chronic left rib deformities due to prior fractures, stable.  IMPRESSION: No active cardiopulmonary disease.   Electronically Signed   By: Skipper Cliche M.D.   On: 12/25/2013 16:55   Ct Head Wo Contrast  12/27/2013   CLINICAL DATA:  Patient having trouble finding words.  EXAM: CT HEAD WITHOUT CONTRAST  TECHNIQUE: Contiguous axial images were obtained from the  base of the skull through the vertex without intravenous contrast.  COMPARISON:  08/20/2012  FINDINGS: There is no evidence of mass effect, midline shift, or extra-axial fluid collections. There is no evidence of a space-occupying lesion or intracranial hemorrhage. There is no evidence of a cortical-based area of acute infarction. There is an old right corona radiata infarct. There is an old left basal ganglia lacunar infarct. . There is generalized cerebral atrophy. There is periventricular white matter low attenuation likely secondary to microangiopathy.  The ventricles and sulci are appropriate for the patient's age. The basal cisterns are patent.  Visualized portions of the orbits are unremarkable. The visualized portions of the paranasal sinuses and  mastoid air cells are unremarkable.  The osseous structures are unremarkable.  IMPRESSION: No acute intracranial pathology.   Electronically Signed   By: Kathreen Devoid   On: 12/27/2013 11:14   Mr Brain Wo Contrast  12/27/2013   CLINICAL DATA:  Stroke.  Altered mental status.  Speech disturbance.  EXAM: MRI HEAD WITHOUT CONTRAST  TECHNIQUE: Multiplanar, multiecho pulse sequences of the brain and surrounding structures were obtained without intravenous contrast.  COMPARISON:  Head CT same day .  FINDINGS: Diffusion imaging shows a punctate acute infarction in the left parietal lobe near the vertex. No other acute insult. There chronic small-vessel changes affecting the pons. There are old small vessel cerebellar infarctions. There are numerous old lacunar infarctions affecting the basal ganglia, thalami and hemispheric deep white matter. There is widespread chronic small vessel disease throughout the white matter of the cerebral hemispheres. No mass lesion, acute hemorrhage, hydrocephalus or extra-axial collection. No pituitary mass. No inflammatory sinus disease. There is a small amount of fluid in the mastoid air cells on the right. Major vessels at the base of the  brain show flow.  IMPRESSION: Punctate acute infarction in the high left parietal lobe.  Extensive chronic ischemic changes elsewhere throughout the brain as outlined above.   Electronically Signed   By: Nelson Chimes M.D.   On: 12/27/2013 17:01    Microbiology: Recent Results (from the past 240 hour(s))  CULTURE, BLOOD (ROUTINE X 2)     Status: None   Collection Time    12/25/13  7:13 PM      Result Value Range Status   Specimen Description BLOOD ARM LEFT   Final   Special Requests BOTTLES DRAWN AEROBIC AND ANAEROBIC 10CC   Final   Culture  Setup Time     Final   Value: 12/26/2013 04:56     Performed at [facility]   Culture     Final   Value: NO GROWTH 5 DAYS     Performed at [facility]   Report Status 01/01/2014 FINAL   Final  CULTURE, BLOOD (ROUTINE X 2)     Status: None   Collection Time    12/25/13  7:18 PM      Result Value Range Status   Specimen Description BLOOD HAND LEFT   Final   Special Requests BOTTLES DRAWN AEROBIC AND ANAEROBIC 10CC   Final   Culture  Setup Time     Final   Value: 12/26/2013 04:56     Performed at [facility]   Culture     Final   Value: NO GROWTH 5 DAYS     Performed at [facility]   Report Status 01/01/2014 FINAL   Final     Labs: Basic Metabolic Panel:  Recent Labs Lab 12/26/13 0440 12/27/13 0549 12/28/13 0702 12/29/13 0521 12/30/13 0519  NA 138 130* 132* 131* 138  K 4.4 4.0 4.2 4.0 3.8  CL 98 94* 91* 93* 99  CO2 29 24 26 26 30   GLUCOSE 96 104* 103* 95 95  BUN 21 20 25* 36* 34*  CREATININE 1.05 1.01 1.02 1.03 0.94  CALCIUM 8.6 7.9* 8.6 8.6 8.9   Liver Function Tests: No results found for this basename: AST, ALT, ALKPHOS, BILITOT, PROT, ALBUMIN,  in the last 168 hours No results found for this basename: LIPASE, AMYLASE,  in the last 168 hours No results found for this basename: AMMONIA,  in the last 168 hours CBC:  Recent Labs  Lab 12/25/13 1549 12/26/13 0440 12/27/13 0549  12/28/13 0702 12/30/13 0519  WBC 5.6 4.7 5.3 11.4* 9.0  NEUTROABS 4.2  --   --   --   --   HGB 12.2* 11.5* 12.0* 13.3 12.1*  HCT 35.4* 33.5* 34.4* 37.1* 35.4*  MCV 97.3 97.4 96.4 94.9 97.0  PLT 168 146* 132* 145* 128*   Cardiac Enzymes: No results found for this basename: CKTOTAL, CKMB, CKMBINDEX, TROPONINI,  in the last 168 hours BNP: BNP (last 3 results) No results found for this basename: PROBNP,  in the last 8760 hours CBG: No results found for this basename: GLUCAP,  in the last 168 hours     Signed:  Rathana Viveros, [place]  [facility] 01/01/2014, 11:15 AM

## 2014-01-01 NOTE — Progress Notes (Signed)
ANTICOAGULATION CONSULT NOTE - Initial Consult  Pharmacy Consult for xarelto Indication: atrial fibrillation  Allergies  Allergen Reactions  . Diazepam     REACTION: agitation  . Ezetimibe-Simvastatin     unknown  . Morphine Other (See Comments)    Hallucinations.  . Sulfonamide Derivatives Hives    Patient Measurements:   Heparin Dosing Weight: 77 kg  Vital Signs: Temp: 98.3 F (36.8 C) (01/06 1430) Temp src: Oral (01/06 1430) BP: 113/62 mmHg (01/06 1430) Pulse Rate: 74 (01/06 1430)  Labs:  Recent Labs  12/30/13 0519  HGB 12.1*  HCT 35.4*  PLT 128*  CREATININE 0.94    The CrCl is unknown because both a height and weight (above a minimum accepted value) are required for this calculation.   Medical History: Past Medical History  Diagnosis Date  . NEOPLASM, MALIGNANT, BLADDER 04/10/2009  . HYPERLIPIDEMIA 04/10/2009  . ANEMIA-NOS 04/10/2009  . 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 ANXIETY 04/10/2009  . HYPERTENSION 04/10/2009  . Atrial fibrillation 04/10/2009  . COPD 04/10/2009  . TRANSIENT ISCHEMIC ATTACK, HX OF 04/10/2009  . Bladder cancer     Dr. Lawerance Bach  . Lacunar stroke 04/07/2011  . Pleural effusion 04/07/2011  . 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 Stroke     Medications:  Prescriptions prior to admission  Medication Sig Dispense Refill  . Cholecalciferol (VITAMIN D) 2000 UNITS tablet Take 2,000-3,000 Units by mouth daily. Patient takes 2000 units daily, except for taking 3000 units (1.5 tablets) on "winter days" and days when "not feeling well."      . digoxin (LANOXIN) 0.125 MG tablet Take 1 tablet (0.125 mg total) by mouth daily.  30 tablet  5  . diltiazem (CARDIZEM CD) 300 MG 24 hr capsule Take 1 capsule (300 mg total) by mouth daily.  30 capsule  0  . Maltodextrin-Xanthan Gum (RESOURCE THICKENUP CLEAR) POWD Take 120 g by mouth as needed.  10 Can  0  . Probiotic Product (PROBIOTIC DAILY PO) Take 1 tablet by mouth every morning.       . Rivaroxaban  (XARELTO) 15 MG TABS tablet Take 1 tablet (15 mg total) by mouth daily.  30 tablet  12    Assessment: Mr. Attig is an 80 yo man originally admitted for CVA, hematuria and flu, He was resumed on Xarelto 15 mg po daily for Afib on 1/2. Pt. Is transferred to CIR today, and pharmacy asked to follow him for xarelto dosing. His scr is 0.94, Hg 13.3, pltc 128K trending down since admission. His renal function qualifies him for Xarelto 20 mg po q evening, but per record his PTA dose was 15mg  daily. Dr. Mare Ferrari had pt on lower dose due to hematuria. Given recent hematuria and low Plt, will continue 15 mg daily     Plan:   1. Resume Xarelto 15 mg po q supper as previously prescribed by Dr. Mare Ferrari. 2. F/u renal function, cbc, s/sx of bleeding  Maryanna Shape, PharmD, BCPS  Clinical Pharmacist  Pager: [phone]   01/01/2014 6:09 PM

## 2014-01-01 NOTE — Progress Notes (Signed)
Physical Therapy Treatment Patient Details Name: [name] MRN: [id number] DOB: [birth year] Today's Date: 01/01/2014 Time: 3016-0109 PT Time Calculation (min): 13 min  PT Assessment / Plan / Recommendation  History of Present Illness [name] is an 80 y.o. male with a past medical history significant for HTN, hyperlipidemia, atrial fibrillation ( off xarelto due to hematuria during this admission), right lacunar stroke with residual mild left leg weakness, bladder cancer, immune thrombocytopenic purpura s/p splenectomy.  [facility] due to confusion in the context of influenza and possible UTI.  CT negative for CVA, MRI report reveals L parietal infarct with chronic ischemic disease in basal ganglia.   PT Comments   Pt is progressing well with mobility despite being sore from yesterday's treatment session.  He was able to progress gait and is now min assist with the RW.  He continues to need constant hands on assist to prevent LOB during gait as he is still listing to the right.  He has gotten approval to d/c to CIR later today.    Follow Up Recommendations  CIR     Does the patient have the potential to tolerate intense rehabilitation    Yes  Barriers to Discharge   None      Equipment Recommendations  Rolling walker with 5" wheels    Recommendations for Other Services   None  Frequency Min 4X/week   Progress towards PT Goals Progress towards PT goals: Progressing toward goals  Plan Current plan remains appropriate    Precautions / Restrictions Precautions Precautions: Fall;Other (comment) Precaution Comments: positive flu   Pertinent Vitals/Pain See vitals flow sheet.    Mobility  Bed Mobility Supine to Sit: 4: Min assist;With rails;HOB flat Sitting - Scoot to Edge of Bed: 4: Min assist;With rail Details for Bed Mobility Assistance: min assist to support trunk during transition to sit.  Pt managing his own legs and pulling on bed rail to pull trunk up to sitting.    Transfers Transfers: Sit to Stand;Stand to Sit Sit to Stand: 4: Min assist;With upper extremity assist;From bed;From elevated surface Stand to Sit: 4: Min assist;With armrests;To bed;To chair/3-in-1;With upper extremity assist Details for Transfer Assistance: Min assist to support trunk during transitions.  Verbal cues for safe hand placement on RW and on armrests of chair.   Ambulation/Gait Ambulation/Gait Assistance: 4: Min assist Ambulation Distance (Feet): 30 Feet (15'x2) Assistive device: Rolling walker Ambulation/Gait Assistance Details: Min assist to support trunk during gait.  Pt groaning as bil legs are sore from increased mobility and exercise during yesterday's session.   Gait Pattern: Step-through pattern;Lateral trunk lean to right;Narrow base of support;Shuffle;Trunk flexed Gait velocity: decreased      PT Goals (current goals can now be found in the care plan section) Acute Rehab PT Goals Patient Stated Goal: get better  Visit Information  Last PT Received On: 01/01/14 Assistance Needed: +1 History of Present Illness: [name] is an 80 y.o. male with a past medical history significant for HTN, hyperlipidemia, atrial fibrillation ( off xarelto due to hematuria during this admission), right lacunar stroke with residual mild left leg weakness, bladder cancer, immune thrombocytopenic purpura s/p splenectomy.  Admitted to [facility] due to confusion in the context of influenza and possible UTI.  CT negative for CVA, MRI report reveals L parietal infarct with chronic ischemic disease in basal ganglia.    Subjective Data  Subjective: Pt reports his legs are sore from working with PT yesterday.  Exercises deferred today for that reason.  Patient Stated Goal: get better   Cognition  Cognition Arousal/Alertness: Awake/alert Behavior During Therapy: WFL for tasks assessed/performed Overall Cognitive Status: Impaired/Different from baseline Area of Impairment:  Memory;Awareness;Problem solving Current Attention Level: Sustained Memory: Decreased short-term memory Following Commands: Follows multi-step commands inconsistently Safety/Judgement: Decreased awareness of deficits;Decreased awareness of safety Awareness: Emergent Problem Solving: Slow processing;Difficulty sequencing;Requires verbal cues;Requires tactile cues    Balance  Static Sitting Balance Static Sitting - Balance Support: Bilateral upper extremity supported;Feet supported Static Sitting - Level of Assistance: 5: Stand by assistance Static Standing Balance Static Standing - Balance Support: Bilateral upper extremity supported Static Standing - Level of Assistance: 4: Min assist Dynamic Standing Balance Dynamic Standing - Balance Support: Bilateral upper extremity supported Dynamic Standing - Level of Assistance: 4: Min assist  End of Session PT - End of Session Equipment Utilized During Treatment: Gait belt Activity Tolerance: Patient limited by fatigue;Patient limited by pain Patient left: in chair;with call bell/phone within reach Nurse Communication: Other (comment) (IV beeping)     Wells Guiles B. Bea Duren, PT, DPT [phone]   01/01/2014, 11:47 AM

## 2014-01-02 ENCOUNTER — Inpatient Hospital Stay (HOSPITAL_COMMUNITY): Payer: Medicare Other

## 2014-01-02 ENCOUNTER — Encounter (HOSPITAL_COMMUNITY): Payer: Self-pay | Admitting: *Deleted

## 2014-01-02 ENCOUNTER — Inpatient Hospital Stay (HOSPITAL_COMMUNITY): Payer: Medicare Other | Admitting: Physical Therapy

## 2014-01-02 DIAGNOSIS — I6330 Cerebral infarction due to thrombosis of unspecified cerebral artery: Secondary | ICD-10-CM

## 2014-01-02 LAB — COMPREHENSIVE METABOLIC PANEL
ALT: 14 U/L (ref 0–53)
AST: 21 U/L (ref 0–37)
Albumin: 2.5 g/dL — ABNORMAL LOW (ref 3.5–5.2)
Alkaline Phosphatase: 75 U/L (ref 39–117)
BILIRUBIN TOTAL: 0.5 mg/dL (ref 0.3–1.2)
BUN: 23 mg/dL (ref 6–23)
CHLORIDE: 102 meq/L (ref 96–112)
CO2: 27 meq/L (ref 19–32)
CREATININE: 0.9 mg/dL (ref 0.50–1.35)
Calcium: 8.5 mg/dL (ref 8.4–10.5)
GFR, EST AFRICAN AMERICAN: 88 mL/min — AB (ref >90)
GFR, EST NON AFRICAN AMERICAN: 76 mL/min — AB (ref >90)
GLUCOSE: 110 mg/dL — AB (ref 70–99)
Potassium: 4.1 mEq/L (ref 3.7–5.3)
Sodium: 139 mEq/L (ref 137–147)
Total Protein: 6.3 g/dL (ref 6.0–8.3)

## 2014-01-02 LAB — URINALYSIS, ROUTINE W REFLEX MICROSCOPIC
Bilirubin Urine: NEGATIVE
GLUCOSE, UA: NEGATIVE mg/dL
Ketones, ur: NEGATIVE mg/dL
Nitrite: NEGATIVE
PH: 6.5 (ref 5.0–8.0)
Protein, ur: 30 mg/dL — AB
Specific Gravity, Urine: 1.016 (ref 1.005–1.030)
Urobilinogen, UA: 0.2 mg/dL (ref 0.0–1.0)

## 2014-01-02 LAB — DIGOXIN LEVEL: Digoxin Level: 0.7 ng/mL — ABNORMAL LOW (ref 0.8–2.0)

## 2014-01-02 LAB — CBC WITH DIFFERENTIAL/PLATELET
BASOS ABS: 0 10*3/uL (ref 0.0–0.1)
Basophils Relative: 0 % (ref 0–1)
EOS PCT: 0 % (ref 0–5)
Eosinophils Absolute: 0 10*3/uL (ref 0.0–0.7)
HCT: 33.3 % — ABNORMAL LOW (ref 39.0–52.0)
HEMOGLOBIN: 11.1 g/dL — AB (ref 13.0–17.0)
LYMPHS ABS: 0.7 10*3/uL (ref 0.7–4.0)
LYMPHS PCT: 11 % — AB (ref 12–46)
MCH: 32.2 pg (ref 26.0–34.0)
MCHC: 33.3 g/dL (ref 30.0–36.0)
MCV: 96.5 fL (ref 78.0–100.0)
MONO ABS: 1.4 10*3/uL — AB (ref 0.1–1.0)
MONOS PCT: 24 % — AB (ref 3–12)
NEUTROS ABS: 3.8 10*3/uL (ref 1.7–7.7)
Neutrophils Relative %: 65 % (ref 43–77)
Platelets: 195 10*3/uL (ref 150–400)
RBC: 3.45 MIL/uL — AB (ref 4.22–5.81)
RDW: 14.2 % (ref 11.5–15.5)
WBC: 5.8 10*3/uL (ref 4.0–10.5)

## 2014-01-02 LAB — URINE MICROSCOPIC-ADD ON

## 2014-01-02 MED ORDER — DICLOFENAC SODIUM 1 % TD GEL
4.0000 g | Freq: Four times a day (QID) | TRANSDERMAL | Status: DC
  Administered 2014-01-02 – 2014-01-05 (×12): 4 g via TOPICAL
  Filled 2014-01-02: qty 100

## 2014-01-02 NOTE — Evaluation (Signed)
Occupational Therapy Assessment and Plan  Patient Details  Name: [name] MRN: [id number] Date of Birth: 1927/05/03  OT Diagnosis: abnormal posture, cognitive deficits and muscle weakness (generalized) Rehab Potential: Rehab Potential: Good ELOS: 14-18 days   Today's Date: 01/02/2014 Time: 0730-0830 and 5664-8303 Time Calculation (min): 60 min and 42 min   Problem List:  Patient Active Problem List   Diagnosis Date Noted  . Influenza with other respiratory manifestations 01/01/2014  . Prediabetes 01/01/2014  . CVA (cerebral infarction) 01/01/2014  . Atrial fibrillation 12/25/2013  . Hypothermia 03/23/2013  . Constipation 10/16/2012  . Preventative health care 09/17/2012  . Nocturia 09/17/2012  . UTI (lower urinary tract infection) 08/22/2012  . Leukocytosis 08/22/2012  . Pseudogout 08/22/2012  . Encephalopathy 08/21/2012  . Left knee pain 08/21/2012  . Prerenal azotemia 08/21/2012  . CVA (cerebral vascular accident)-Acute right small nonhemorrhagic infarct seen on MRI 08/17/2012  . Lacunar stroke 04/07/2011  . Pleural effusion 04/07/2011  . 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 Long term current use of anticoagulant 04/07/2011  . NEOPLASM, MALIGNANT, BLADDER 04/10/2009  . HYPERLIPIDEMIA 04/10/2009  . ANEMIA-NOS 04/10/2009  . 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 ANXIETY 04/10/2009  . HYPERTENSION 04/10/2009  . COPD 04/10/2009  . TRANSIENT ISCHEMIC ATTACK, HX OF 04/10/2009    Past Medical History:  Past Medical History  Diagnosis Date  . NEOPLASM, MALIGNANT, BLADDER 04/10/2009  . HYPERLIPIDEMIA 04/10/2009  . ANEMIA-NOS 04/10/2009  . 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 ANXIETY 04/10/2009  . HYPERTENSION 04/10/2009  . Atrial fibrillation 04/10/2009  . COPD 04/10/2009  . TRANSIENT ISCHEMIC ATTACK, HX OF 04/10/2009  . Bladder cancer     Dr. Darvin Neighbours  . Lacunar stroke 04/07/2011  . Pleural effusion 04/07/2011  . 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 Stroke    Past  Surgical History:  Past Surgical History  Procedure Laterality Date  . Splenectomy    . Bilateral vats ablation    . Facial cancer      facial skin cancer    Assessment & Plan Clinical Impression: Patient is a 80 y.o. right handed male with history of COPD, prior CVA 08/2012 with right sided weakness and dysphagia ( CIR stay) , A fib--Xarelto and bladder cancer with ongoing treatment at [facility]. He was admitted 12/25/2013 with altered mental status and low grade fever of 100 Fahrenheit and reported hematuria. Cranial CT scan negative and MRI of the brain showed punctate acute infarct in the high left parietal lobe as well as extensive chronic ischemic changes. Patient's Xarelto held for a short time secondary to hematuria. He was started on IV rocephin due to SIRS as well as Tamiflu as he tested positive for type A influenza. He was placed on Dysphagia 1 nectar thick liquid diet per ST recommendations. Patient with difficulty with processing with easy distractibility, as well as problems with initiation. Needs tactile cues for basic tasks and mobility. Mentation improving and neurology recommends resuming Xarelto for embolic stroke due to A Fib.  Patient transferred to CIR on 01/01/2014 .    Patient currently requires max-total with basic self-care skills secondary to decreased cardiorespiratoy endurance, decreased motor planning and decreased initiation, decreased attention, decreased awareness, decreased safety awareness, decreased memory and delayed processing.  Prior to hospitalization, patient could complete BADLs with supervision-modified independent level.  Patient will benefit from skilled intervention to increase independence with basic self-care skills prior to discharge home with care partner.  Anticipate patient will require 24 hour supervision and follow up home health.  OT -  End of Session Activity Tolerance: Decreased this session Endurance Deficit: Yes OT Assessment Rehab Potential:  Good OT Patient demonstrates impairments in the following area(s): Balance;Cognition;Endurance;Motor;Pain;Perception;Safety;Sensory;Vision OT Basic ADL's Functional Problem(s): Eating;Grooming;Dressing;Bathing;Toileting OT Transfers Functional Problem(s): Tub/Shower;Toilet OT Additional Impairment(s): None OT Plan OT Intensity: Minimum of 1-2 x/day, 45 to 90 minutes OT Frequency: 5 out of 7 days OT Duration/Estimated Length of Stay: 14-18 days OT Treatment/Interventions: Balance/vestibular training;Cognitive remediation/compensation;Community reintegration;Discharge planning;DME/adaptive equipment instruction;Functional mobility training;Pain management;Patient/family education;Neuromuscular re-education;Psychosocial support;Self Care/advanced ADL retraining;Therapeutic Activities;Therapeutic Exercise;UE/LE Strength taining/ROM;UE/LE Coordination activities;Visual/perceptual remediation/compensation OT Self Feeding Anticipated Outcome(s): supervision OT Basic Self-Care Anticipated Outcome(s): supervision OT Toileting Anticipated Outcome(s): supervision OT Bathroom Transfers Anticipated Outcome(s): supervision OT Recommendation Patient destination: Home Follow Up Recommendations: Home health OT Equipment Recommended: 3 in 1 bedside comode;Tub/shower bench   Skilled Therapeutic Intervention Session 1: Pt seen for ADL retraining with focus on arousal, attention to task, functional transfers, initiation and sequencing. Pt completed peri hygiene and dressing d/t decreased arousal, attention, and pain in bil knees (RN and MD aware of this). Required multimodal cues to initiate task of peri hygiene while supine in bed. Completed supine>sit with HOB elevated and mod assist to manage BLE secondary to pain. Supervision for sitting balance and mod assist sit<>stand and standing balance during buttocks hygiene and clothing management around waist. Pt required manual facilitation and cueing for upright  posture as he demonstrated flexed trunk and flexed cervical region. Presented clothing to pt and no initiation of task. Assisted with threading RLE then pt attempted LLE however unable d/t pain. Pt required max cues to focus on task as he was easily distracted. Completed UB dressing with max assist and max cues for initiation and sequencing. Presented pt with wash cloth for face and pt perseverating on task. Pt completed stand pivot transfer bed>w/c with mod assist and max instructional cueing. At end of session pt left in room with wife and all needs in reach.   Session 2: Pt received supine in bed with wife and son present. Pt asleep and initially difficult to arouse. Required mod assist for supine>sit and increase arousal noted. Ambulated to bathroom using RW with mod-min assist and mod assist for sit<>stand from toilet. Pt required max cues for RW safety as he tends to place it too far in front of him. Max cues and manual facilitation provided for upright posture. Provided cues on wall for pt to attend to however only attended approx 2-3 sec. Pt required increased time for processing and mod-max cues for hand placement during sit<>stand. Encouraged pt initiating sit>stand and engaging quads. Pt incontinent with brief and therapist assisted in changing. Pt required total assist for toilet task and did assist with clothing management down requiring min assist for balance. Pt then ambulated to recliner chair to engage in self-feeding as pt had not yet eaten lunch. Pt left in chair with family and tech present.   OT Evaluation Precautions/Restrictions  Precautions Precautions: Fall Restrictions Weight Bearing Restrictions: No General   Vital Signs   Pain Pain Assessment Pain Assessment: FLACC Pain Score: 4  Pain Type: Acute pain Pain Location: Knee Pain Orientation: Right;Left Pain Descriptors / Indicators: Sharp Pain Onset: Other (Comment) (Initially painful with activity; pain decreased with  increased mobility) Pain Intervention(s): RN made aware;Ambulation/increased activity Home Living/Prior Functioning Home Living Available Help at Discharge: Available 24 hours/day Type of Home: House Home Access: Stairs to enter [employer] of Steps: 4 Entrance Stairs-Rails: Right Home Layout: One level Additional Comments: Wife unsure as to whether  walker would fit in bathroom.  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 Leisure: Hobbies-yes (Comment) (golfing) ADL   Vision/Perception  Vision - History Baseline Vision: Wears glasses all the time Visual History: Cataracts Patient Visual Report: No change from baseline Vision - Assessment Eye Alignment: Impaired (comment) Vision Assessment: Vision not tested Additional Comments: difficult to assess secondary to cognitive deficits Praxis Praxis: Impaired Praxis Impairment Details: Initiation;Perseveration  Cognition Overall Cognitive Status: Impaired/Different from baseline Arousal/Alertness: Lethargic (difficult to arouse) Orientation Level: Disoriented to place Attention: Focused;Sustained Focused Attention: Impaired Focused Attention Impairment: Verbal basic;Functional basic Sustained Attention: Impaired Sustained Attention Impairment: Verbal basic;Functional basic Memory: Impaired Memory Impairment: Decreased recall of new information Awareness: Impaired Awareness Impairment: Intellectual impairment Problem Solving: Impaired Problem Solving Impairment: Verbal basic;Functional basic Behaviors: Perseveration Safety/Judgment: Impaired Comments: pt presents with significantly impaired LOA and sustained attention which impacts all other levels of  cognitive function Sensation Sensation Additional Comments: unable to accurately test UE sensation, proprioception due to decreased pt arousal and attention Motor    Mobility     Trunk/Postural Assessment     Balance   Extremity/Trunk Assessment RUE Assessment RUE Assessment: Within Functional Limits (would benefit from BUE strengthening) LUE Assessment LUE Assessment: Within Functional Limits (would benefit from BUE strengthening)  FIM:  FIM - Eating Eating Activity: 1: Helper feeds patient FIM - Grooming Grooming Steps: Wash, rinse, dry face Grooming: 2: Patient completes 1 of 4 or 2 of 5 steps FIM - Bathing Bathing Steps Patient Completed: Front perineal area Bathing: 1: Total-Patient completes 0-2 of 10 parts or less than 25% FIM - Upper Body Dressing/Undressing Upper body dressing/undressing steps patient completed: Thread/unthread left sleeve of pullover shirt/dress Upper body dressing/undressing: 2: Max-Patient completed 25-49% of tasks FIM - Lower Body Dressing/Undressing Lower body dressing/undressing: 1: Total-Patient completed less than 25% of tasks FIM - Control and instrumentation engineer Devices: Walker;Arm rests Bed/Chair Transfer: 2: Bed > Chair or W/C: Max A (lift and lower assist);2: Chair or W/C > Bed: Max A (lift and lower assist)   Refer to Care Plan for Long Term Goals  Recommendations for other services: None  Discharge Criteria: Patient will be discharged from OT if patient refuses treatment 3 consecutive times without medical reason, if treatment goals not met, if there is a change in medical status, if patient makes no progress towards goals or if patient is discharged from hospital.  The above assessment, treatment plan, treatment alternatives and goals were discussed and mutually agreed upon: by patient and by family  Duayne Cal 01/02/2014, 1:16 PM

## 2014-01-02 NOTE — Progress Notes (Signed)
Nutrition Education Note  RD consulted for nutrition education regarding diabetes and a Heart Healthy diet.   Lab Results  Component Value Date   HGBA1C 5.8* 12/29/2013   Lipid Panel     Component Value Date/Time   CHOL 107 12/29/2013 0521   TRIG 53 12/29/2013 0521   HDL 49 12/29/2013 0521   CHOLHDL 2.2 12/29/2013 0521   VLDL 11 12/29/2013 0521   LDLCALC 47 12/29/2013 0521   Pt very somnolent at time of visit and unable to speak with RD. Pt's son in room reports pt has been eating well; pt eats mostly vegetables, eats a protein-rich food daily, and pt doesn't like sweets. Based on pt's son's report, pt appears to eat a fairly healthy diet.  Nutrition education not appropriate at this time. If pt appropriate for education prior to discharge, please re-consult RD.   Body mass index is 20.02 kg/(m^2). Pt meets criteria for Normal Weight based on current BMI. Per weight history, pt's weight has been stable around 130 lbs for the past year.   Current diet order is Dysphagia 1 with nectar thick liquids, patient is consuming approximately 50-100% of meals at this time. Labs and medications reviewed.   Pryor Ochoa RD, LDN Inpatient Clinical Dietitian Pager: [phone] After Hours Pager: [phone]

## 2014-01-02 NOTE — Progress Notes (Signed)
Patient information reviewed and entered into eRehab system by Naim Murtha, RN, CRRN, PPS Coordinator.  Information including medical coding and functional independence measure will be reviewed and updated through discharge.     Per nursing patient was given "Data Collection Information Summary for Patients in Inpatient Rehabilitation Facilities with attached "Privacy Act Statement-Health Care Records" upon admission.  

## 2014-01-02 NOTE — Evaluation (Addendum)
Physical Therapy Assessment and Plan  Patient Details  Name: [name] MRN: [id number] Date of Birth: [birth year]  PT Diagnosis: Abnormal posture, Abnormality of gait, Coordination disorder, Hemiparesis dominant, Impaired cognition and Pain in bilateral LE's Rehab Potential: Good ELOS: 14-18 days   Today's Date: 01/02/2014 Time: 0900-1005 Time Calculation (min): 65 min  Problem List:  Patient Active Problem List   Diagnosis Date Noted  . Influenza with other respiratory manifestations 01/01/2014  . Prediabetes 01/01/2014  . CVA (cerebral infarction) 01/01/2014  . Atrial fibrillation 12/25/2013  . Hypothermia 03/23/2013  . Constipation 10/16/2012  . Preventative health care 09/17/2012  . Nocturia 09/17/2012  . UTI (lower urinary tract infection) 08/22/2012  . Leukocytosis 08/22/2012  . Pseudogout 08/22/2012  . Encephalopathy 08/21/2012  . Left knee pain 08/21/2012  . Prerenal azotemia 08/21/2012  . CVA (cerebral vascular accident)-Acute right small nonhemorrhagic infarct seen on MRI 08/17/2012  . Lacunar stroke 04/07/2011  . Pleural effusion 04/07/2011  . 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 Long term current use of anticoagulant 04/07/2011  . NEOPLASM, MALIGNANT, BLADDER 04/10/2009  . HYPERLIPIDEMIA 04/10/2009  . ANEMIA-NOS 04/10/2009  . 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 ANXIETY 04/10/2009  . HYPERTENSION 04/10/2009  . COPD 04/10/2009  . TRANSIENT ISCHEMIC ATTACK, HX OF 04/10/2009    Past Medical History:  Past Medical History  Diagnosis Date  . NEOPLASM, MALIGNANT, BLADDER 04/10/2009  . HYPERLIPIDEMIA 04/10/2009  . ANEMIA-NOS 04/10/2009  . 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 ANXIETY 04/10/2009  . HYPERTENSION 04/10/2009  . Atrial fibrillation 04/10/2009  . COPD 04/10/2009  . TRANSIENT ISCHEMIC ATTACK, HX OF 04/10/2009  . Bladder cancer     Dr. Lawerance [name]  . Lacunar stroke 04/07/2011  . Pleural effusion 04/07/2011  . 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  Stroke    Past Surgical History:  Past Surgical History  Procedure Laterality Date  . Splenectomy    . Bilateral vats ablation    . Facial cancer      facial skin cancer    Assessment & Plan Clinical Impression: [name] is a 80 y.o. right handed male with history of COPD, prior CVA 08/2012 with right sided weakness and dysphagia ( CIR stay) , A fib--Xarelto and bladder cancer with ongoing treatment at [facility]. He was admitted 12/25/2013 with altered mental status and low grade fever of 100 Fahrenheit and reported hematuria. Cranial CT scan negative and MRI of the brain showed punctate acute infarct in the high left parietal lobe as well as extensive chronic ischemic changes. Patient's Xarelto held for a short time secondary to hematuria. He was started on IV rocephin due to SIRS as well as Tamiflu as he tested positive for type A influenza. He was placed on Dysphagia 1 nectar thick liquid diet per ST recommendations. Patient with difficulty with processing with easy distractibility, as well as problems with initiation. Needs tactile cues for basic tasks and mobility. Mentation improving and neurology recommends resuming Xarelto for embolic stroke due to A Fib. Patient transferred to CIR on 01/01/2014 .   Patient currently requires min-max assist with mobility secondary to muscle weakness and muscle joint tightness, decreased cardiorespiratoy endurance, impaired timing and sequencing, unbalanced muscle activation and decreased coordination, decreased initiation, decreased attention, decreased awareness, decreased problem solving, decreased safety awareness, decreased memory and delayed processing and decreased sitting balance, decreased standing balance, decreased postural control and decreased balance strategies.  Prior to hospitalization, patient was independent  with mobility and lived with Spouse in a House home.  Home access is  4Stairs to enter.  Patient will benefit from skilled PT intervention  to maximize safe functional mobility and minimize fall risk for planned discharge home with 24 hour supervision.  Anticipate patient will benefit from follow up [place] at discharge.  PT - End of Session Activity Tolerance: Tolerates 10 - 20 min activity with multiple rests Endurance Deficit: Yes Endurance Deficit Description: Pt with standing rest break x1 during gait x20'. PT Assessment Rehab Potential: Good PT Patient demonstrates impairments in the following area(s): Balance;Endurance;Motor;Pain;Perception;Safety PT Transfers Functional Problem(s): Bed Mobility;Bed to Chair;Car;Furniture PT Locomotion Functional Problem(s): Ambulation;Wheelchair Mobility;Stairs PT Plan PT Intensity: Minimum of 1-2 x/day ,45 to 90 minutes PT Frequency: 5 out of 7 days PT Duration Estimated Length of Stay: 14-18 days PT Treatment/Interventions: Ambulation/gait training;Balance/vestibular training;Cognitive remediation/compensation;Disease management/prevention;Discharge planning;DME/adaptive equipment instruction;Functional mobility training;Patient/family education;Pain management;Neuromuscular re-education;UE/LE Coordination activities;Wheelchair propulsion/positioning;UE/LE Strength taining/ROM;Stair training;Therapeutic Activities;Therapeutic Exercise PT Transfers Anticipated Outcome(s): Supervision-Mod I PT Locomotion Anticipated Outcome(s): Supervision-Mod I PT Recommendation Follow Up Recommendations: Home health PT Patient destination: Home Equipment Recommended: Rolling walker with 5" wheels;To be determined  Skilled Therapeutic Intervention PT evaluation performed. See below for detailed findings. Treatment initiated. Pt oriented x3 to person, place, and situation. Sit<>stand x2 trials; initial trial with max A secondary to bilat LE pain; second trial with mod A, manual facilitation of anterior weight shift.  Gait x20' in controlled environment with rolling walker and min A, increased time, multimodal  cueing for postural normalization with inconsistent pt carryover; standing rest break x30 seconds. See below for detailed description of gait pattern.W/c mobility performed x5' in controlled environment with bilat UE's (LUE>RUE) with total A secondary to decreased pt arousal. Therapist departed with pt seated in w/c with quick release belt in place for safety, wife and son present, and all needs within reach.  PT Evaluation Precautions/Restrictions  Fall General   Vital SignsTherapy Vitals Temp: 98.5 F (36.9 C) Temp src: Oral Pulse Rate: 131 Resp: 20 BP: 112/75 mmHg Patient Position, if appropriate: Sitting Oxygen Therapy SpO2: 98 % O2 Device: None (Room air) Pain Pain Assessment Pain Assessment: FLACC Pain Score: 4  Pain Type: Acute pain Pain Location: Knee Pain Orientation: Right;Left Pain Descriptors / Indicators: Sharp Pain Onset: Other (Comment) (Initially painful with activity; pain decreased with increased mobility) Pain Intervention(s): RN made aware;Ambulation/increased activity Home Living/Prior Functioning Home Living Available Help at Discharge: Available 24 hours/day Type of Home: House Home Access: Stairs to enter [employer] of Steps: 4 Entrance Stairs-Rails: Right Home Layout: One level Additional Comments: Wife unsure as to whether walker would fit in bathroom.  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 Leisure: Hobbies-yes (Comment) (golfing) Vision/Perception  Vision - History Baseline Vision: Wears glasses all the time Visual History: Cataracts Patient Visual Report: No change from baseline Vision - Assessment Eye Alignment: Impaired (comment) Vision Assessment: Vision not tested Additional Comments: difficult  to assess secondary to cognitive deficits Praxis Praxis: Impaired Praxis Impairment Details: Initiation;Perseveration  Cognition Overall Cognitive Status: Impaired/Different from baseline Arousal/Alertness: Lethargic (difficult to arouse) Orientation Level: Disoriented to place Attention: Focused;Sustained Focused Attention: Impaired Focused Attention Impairment: Verbal basic;Functional basic Sustained Attention: Impaired Sustained Attention Impairment: Verbal basic;Functional basic Memory: Impaired Memory Impairment: Decreased recall of new information Awareness: Impaired Awareness Impairment: Intellectual impairment Problem Solving: Impaired Problem Solving Impairment: Verbal basic;Functional basic Behaviors: Perseveration Safety/Judgment: Impaired Comments: pt presents with significantly impaired LOA  and sustained attention which impacts all other levels of cognitive function Sensation Sensation Additional Comments: unable to accurately test UE sensation, proprioception due to decreased pt arousal and attention Coordination Gross Motor Movements are Fluid and Coordinated: No Heel Shin Test: Decreased speed and excursion on RLE (compared to LLE). Motor  Motor Motor: Abnormal postural alignment and control  Mobility Bed Mobility Bed Mobility: Supine to Sit Supine to Sit: 3: Mod assist Sitting - Scoot to Edge of Bed: 4: Min assist;With rail Transfers Transfers: Yes Sit to Stand: From chair/3-in-1;With armrests;With upper extremity assist;2: Max assist;3: Mod assist Sit to Stand Details (indicate cue type and reason): Max A for initial sit>stand secondary to bilat LE pain. Mod A for subsequent sit>stand. Stand to Sit: 4: Min assist;With armrests;To bed;To chair/3-in-1;With upper extremity assist Stand to Sit Details (indicate cue type and reason): Verbal cues for precautions/safety Stand Pivot Transfers: 3: Mod assist;2: Max assist Stand Pivot Transfer Details: Verbal cues  for technique;Manual facilitation for weight shifting;Verbal cues for safe use of DME/AE Stand Pivot Transfer Details (indicate cue type and reason): w/c<>bed using rolling walker with mod-max A secondary to bilat LE pain. Locomotion  Ambulation Ambulation: Yes Ambulation/Gait Assistance: 4: Min assist Ambulation Distance (Feet): 20 Feet Assistive device: Rolling walker Ambulation/Gait Assistance Details: Manual facilitation for weight shifting Ambulation/Gait Assistance Details: Min A for stability; manual facilitation of weight shift to RLE to enable LLE advancement. Gait Gait: Yes Gait Pattern: Step-through pattern;Narrow base of support;Trunk flexed;Decreased weight shift to right;Decreased dorsiflexion - right;Decreased dorsiflexion - left;Decreased stance time - right;Decreased step length - left;Left flexed knee in stance;Right flexed knee in stance Gait velocity: Significantly decreased High Level Ambulation High Level Ambulation: Side stepping Side Stepping: With rolling walker and min A, manual facilitation of weight shift to RLE. Stairs / Additional Locomotion Stairs: No (Unsafe at this time secondary to decreased pt arousal/attention.) Architect: Yes Wheelchair Assistance: 1: +1 Total assist Wheelchair Assistance Details: Verbal cues for technique;Manual facilitation for Horticulturist, commercial: Both upper extremities Wheelchair Parts Management: Needs assistance Distance: 5  Trunk/Postural Assessment  Cervical Assessment Cervical Assessment: Within Functional Limits Thoracic Assessment Thoracic Assessment: Within Functional Limits Lumbar Assessment Lumbar Assessment: Within Functional Limits Postural Control Postural Control: Deficits on evaluation Righting Reactions: Hip strategy delayed. No ankle strategy, no stepping strategy noted with balance perturbations in all directions. Postural Limitations: Able to maintain upright  posture for <10 seconds secondary to limited sustained attention vs. limited muscular endurance in cervicothoracic spine extensors.  Balance Balance Balance Assessed: Yes Static Sitting Balance Static Sitting - Balance Support: Bilateral upper extremity supported;Feet supported Static Sitting - Level of Assistance: 5: Stand by assistance Static Standing Balance Static Standing - Balance Support: Bilateral upper extremity supported Static Standing - Level of Assistance: 4: Min assist Dynamic Standing Balance Dynamic Standing - Balance Support: Bilateral upper extremity supported Dynamic Standing - Level of Assistance: 4: Min assist Extremity Assessment  RUE Assessment RUE Assessment: Within Functional Limits (would benefit from BUE strengthening) LUE Assessment LUE Assessment: Within Functional Limits (would benefit from BUE strengthening) RLE Assessment RLE Assessment: Exceptions to [facility] RLE PROM (degrees) RLE Overall PROM Comments: Knee extension active/passive ROM limited by pain RLE Strength RLE Overall Strength: Deficits RLE Overall Strength Comments: Grossly 4/5 LLE Assessment LLE Assessment: Exceptions to WFL LLE PROM (degrees) LLE Overall PROM Comments: Knee extension active/passive ROM limited by pain. LLE Strength LLE Overall Strength: Deficits LLE Overall Strength Comments: Grossly 3-/5 L hip, knee, and ankle plantar/dorsiflexion; 2/5  inversion/eversion  FIM:  FIM - Bed/Chair Transfer Bed/Chair Transfer: 3: Supine > Sit: Mod A (lifting assist/Pt. 50-74%/lift 2 legs FIM - Locomotion: Wheelchair Distance: 5 FIM - Locomotion: Ambulation Ambulation/Gait Assistance: 4: Min assist   Refer to Care Plan for Long Term Goals  Recommendations for other services: None  Discharge Criteria: Patient will be discharged from PT if patient refuses treatment 3 consecutive times without medical reason, if treatment goals not met, if there is a change in medical status, if patient  makes no progress towards goals or if patient is discharged from hospital.  The above assessment, treatment plan, treatment alternatives and goals were discussed and mutually agreed upon: by patient and by family  Stefano Gaul 01/02/2014, 5:08 PM

## 2014-01-02 NOTE — Discharge Instructions (Signed)
Information on my medicine - XARELTO (Rivaroxaban)  This medication education was reviewed with me or my healthcare representative as part of my discharge preparation.  The pharmacist that spoke with me during my hospital stay was:  Steffanie Dunn, PharmD  Why was Xarelto prescribed for you? Xarelto was prescribed for you to reduce the risk of a blood clots forming after orthopedic surgery OR to reduce the risk of forming blood clots that cause a stroke if you have a medical condition called atrial fibrillation (a type of irregular heartbeat).  What do you need to know about xarelto ? Take your Xarelto 15mg  ONCE DAILY at the same time every day with your evening meal. If you have difficulty swallowing the tablet whole, you may crush it and mix in applesauce just prior to taking your dose.  Take Xarelto exactly as prescribed by your doctor and DO NOT stop taking Xarelto without talking to the doctor who prescribed the medication.  Stopping without other stroke or VTE prevention medication to take the place of Xarelto may increase your risk of developing a new clot or stroke.  Refill your prescription before you run out.  After discharge, you should have regular check-up appointments with your healthcare provider that is prescribing your Xarelto.  In the future your dose may need to be changed if your kidney function or weight changes by a significant amount.  What do you do if you miss a dose? If you are taking Xarelto ONCE DAILY and you miss a dose, take it as soon as you remember on the same day then continue your regularly scheduled once daily regimen the next day. Do not take two doses of Xarelto at the same time.   Important Safety Information A possible side effect of Xarelto is bleeding. You should call your healthcare provider right away if you experience any of the following:   Bleeding from an injury or your nose that does not stop.   Unusual colored urine (red or dark brown) or  unusual colored stools (red or black).   Unusual bruising for unknown reasons.   A serious fall or if you hit your head (even if there is no bleeding).  Some medicines may interact with Xarelto and might increase your risk of bleeding while on Xarelto. To help avoid this, consult your healthcare provider or pharmacist prior to using any new prescription or non-prescription medications, including herbals, vitamins, non-steroidal anti-inflammatory drugs (NSAIDs) and supplements.  This website has more information on Xarelto: [email or web address]

## 2014-01-02 NOTE — Progress Notes (Signed)
Inpatient [facility] Individual Statement of Services  Patient Name:  [name]  Date:  01/02/2014  Welcome to the [facility].  Our goal is to provide you with an individualized program based on your diagnosis and situation, designed to meet your specific needs.  With this comprehensive rehabilitation program, you will be expected to participate in at least 3 hours of rehabilitation therapies Monday-Friday, with modified therapy programming on the weekends.  Your rehabilitation program will include the following services:  Physical Therapy (PT), Occupational Therapy (OT), Speech Therapy (ST), 24 hour per day rehabilitation nursing, Therapeutic Recreation (TR), Neuropsychology, Case Management (Social Worker), Rehabilitation Medicine, Nutrition Services and Pharmacy Services  Weekly team conferences will be held on Wednesdays to discuss your progress.  Your Social Worker will talk with you frequently to get your input and to update you on team discussions.  Team conferences with you and your family in attendance may also be held.  Expected length of stay: about 2 weeks  Overall anticipated outcome: Supervision level   Depending on your progress and recovery, your program may change. Your Social Worker will coordinate services and will keep you informed of any changes. Your Social Worker's name and contact numbers are listed  below.  The following services may also be recommended but are not provided by the [facility] will be made to provide these services after discharge if needed.  Arrangements include referral to agencies that provide these services.  Your insurance has been verified to be:  [employer] Your primary doctor is:  Dr. Cathlean Cower  Pertinent information will be shared with your doctor and your  insurance company.  Social Worker:  Alfonse Alpers, LCSW  [phone] or ([phone]  Information discussed with and copy given to patient by: Trey Sailors, 01/02/2014, 11:32 AM

## 2014-01-02 NOTE — Evaluation (Signed)
Speech Language Pathology Assessment and Plan  Patient Details  Name: [name] MRN: [id number] Date of Birth: 1927/07/28  SLP Diagnosis: Cognitive Impairments;Speech and Language deficits;Dysphagia  Rehab Potential: Good ELOS: 16 days   Today's Date: 01/02/2014 Time: 1100-1200 Time Calculation (min): 60 min  Problem List:  Patient Active Problem List   Diagnosis Date Noted  . Influenza with other respiratory manifestations 01/01/2014  . Prediabetes 01/01/2014  . CVA (cerebral infarction) 01/01/2014  . Atrial fibrillation 12/25/2013  . Hypothermia 03/23/2013  . Constipation 10/16/2012  . Preventative health care 09/17/2012  . Nocturia 09/17/2012  . UTI (lower urinary tract infection) 08/22/2012  . Leukocytosis 08/22/2012  . Pseudogout 08/22/2012  . Encephalopathy 08/21/2012  . Left knee pain 08/21/2012  . Prerenal azotemia 08/21/2012  . CVA (cerebral vascular accident)-Acute right small nonhemorrhagic infarct seen on MRI 08/17/2012  . Lacunar stroke 04/07/2011  . Pleural effusion 04/07/2011  . 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 Long term current use of anticoagulant 04/07/2011  . NEOPLASM, MALIGNANT, BLADDER 04/10/2009  . HYPERLIPIDEMIA 04/10/2009  . ANEMIA-NOS 04/10/2009  . 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 ANXIETY 04/10/2009  . HYPERTENSION 04/10/2009  . COPD 04/10/2009  . TRANSIENT ISCHEMIC ATTACK, HX OF 04/10/2009   Past Medical History:  Past Medical History  Diagnosis Date  . NEOPLASM, MALIGNANT, BLADDER 04/10/2009  . HYPERLIPIDEMIA 04/10/2009  . ANEMIA-NOS 04/10/2009  . 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 ANXIETY 04/10/2009  . HYPERTENSION 04/10/2009  . Atrial fibrillation 04/10/2009  . COPD 04/10/2009  . TRANSIENT ISCHEMIC ATTACK, HX OF 04/10/2009  . Bladder cancer     Dr. Lawerance Bach  . Lacunar stroke 04/07/2011  . Pleural effusion 04/07/2011  . 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 Stroke    Past Surgical History:  Past Surgical History   Procedure Laterality Date  . Splenectomy    . Bilateral vats ablation    . Facial cancer      facial skin cancer    Assessment / Plan / Recommendation Clinical Impression  Pt is an 80 y.o. right handed male with history of COPD, prior CVA 08/2012 with right sided weakness and dysphagia (CIR stay), A fib--Xarelto and bladder cancer with ongoing tx at [facility]. He was admitted 12/25/13 with altered mental status and low grade fever of 100 Fahrenheit and reported hematuria. Cranial CT scan negative and MRI of the brain showed punctate acute infarct in the high left parietal lobe as well as extensive chronic ischemic changes. Patient's Xarelto held for a short time secondary to hematuria. He was started on IV rocephin due to SIRS as well as Tamiflu as he tested positive for type A influenza. He was placed on Dysphagia 1 nectar thick liquid diet per ST recommendations. Patient with difficulty with processing with easy distractibility, as well as problems with initiation. Needs tactile cues for basic tasks and mobility. Mentation improving and neurology recommends resuming Xarelto for embolic stroke due to A Fib. CIR recommended by rehab team and patient admitted 1/6; SLP bedside swallow and cognitive-linguistic evals completed 1/7. Pt presented with severely decreased LOA and sustained attention, impacting his ability to participate in functional activities. Pt required Max multimodal cueing to sustain his attention for a few seconds at a time. Pt was able to consume minimal PO trials of thin liquid and pureed solids with no overt s/s of aspiration. Although cognition significantly increases aspiration risk at this time, suspect good prognosis for diet advancement as mentation improves. Pt had minimal verbal expression today, which was limited to one-word utterances  with reduced intelligibility; further tx focused on differential diagnosis of speech/language abilities is recommended as alertness improves. Pt will  benefit from SLP services to maximize cognitive function, functional communication, and swallowing safety prior to discharge.     SLP Assessment  Patient will need skilled Speech Lanaguage Pathology Services during CIR admission    Recommendations  Diet Recommendations: Dysphagia 1 (Puree);Nectar-thick liquid Liquid Administration via: Cup;No straw Medication Administration: Crushed with puree Supervision: Full supervision/cueing for compensatory strategies Compensations: Slow rate;Small sips/bites Postural Changes and/or Swallow Maneuvers: Seated upright 90 degrees Oral Care Recommendations: Oral care BID Patient destination: Home Follow up Recommendations: Home Health SLP;24 hour supervision/assistance Equipment Recommended: None recommended by SLP    SLP Frequency 5 out of 7 days   SLP Treatment/Interventions Cognitive remediation/compensation;Cueing hierarchy;Dysphagia/aspiration precaution training;Environmental controls;Functional tasks;Internal/external aids;Patient/family education;Speech/Language facilitation    Pain Pain Assessment Pain Assessment: FLACC Pain Score: 4  Pain Type: Acute pain Pain Location: Knee Pain Orientation: Right;Left Pain Descriptors / Indicators: Sharp Pain Onset: Other (Comment) (Initially painful with activity; pain decreased with increased mobility) Pain Intervention(s): RN made aware;Ambulation/increased activity Prior Functioning Cognitive/Linguistic Baseline: Baseline deficits Baseline deficit details: son reports mild cognitive impairments from previous CVA 07/2012 Type of Home: House  Lives With: Spouse Available Help at Discharge: Available 24 hours/day Vocation: Retired  Industrial/product designer Term Goals: Week 1: SLP Short Term Goal 1 (Week 1): Pt will consume Dys 1 textures and nectar thick liquids with Min cues to utilize safe swallowing strategies to minimize aspiration risk SLP Short Term Goal 2 (Week 1): Pt will consume therapeutic trials of thin  liquid with minimal overt s/s of aspiration to determine readiness for diet advancement SLP Short Term Goal 3 (Week 1): Pt will sustain attention to basic functional task for 30 seconds with Max cues SLP Short Term Goal 4 (Week 1): Pt will use environmental clues to demonstrate orientation x4 with Max cues SLP Short Term Goal 5 (Week 1): Pt will follow one-step commands during functional task with Max cues SLP Short Term Goal 6 (Week 1): Pt will verbally communicate wants/needs during functional task with Max cues  See FIM for current functional status Refer to Care Plan for Long Term Goals  Recommendations for other services: None  Discharge Criteria: Patient will be discharged from SLP if patient refuses treatment 3 consecutive times without medical reason, if treatment goals not met, if there is a change in medical status, if patient makes no progress towards goals or if patient is discharged from hospital.  The above assessment, treatment plan, treatment alternatives and goals were discussed and mutually agreed upon: by family   Germain Osgood, M.A. CCC-SLP [phone]   Germain Osgood 01/02/2014, 3:41 PM

## 2014-01-02 NOTE — Progress Notes (Signed)
80 y.o. right handed male with history of COPD, prior CVA 08/2012 with right sided weakness and dysphagia ( CIR stay) , A fib--Xarelto and bladder cancer with ongoing treatment at [facility]. He was admitted 12/25/2013 with altered mental status and low grade fever of 100 Fahrenheit and reported hematuria. Cranial CT scan negative and MRI of the brain showed punctate acute infarct in the high left parietal lobe as well as extensive chronic ischemic changes. Patient's Xarelto held for a short time secondary to hematuria. He was started on IV rocephin due to SIRS as well as Tamiflu as he tested positive for type A influenza. He was placed on Dysphagia 1 nectar thick liquid diet per ST recommendations. Patient with difficulty with processing with easy distractibility, as well as problems with initiation.   Subjective/Complaints: Wife noted more lethargy yesterday No pt c/o Intermittent cough Pt seated at EOB, starting OT  Objective: Vital Signs: Blood pressure 110/75, pulse 103, temperature 98.9 F (37.2 C), temperature source Oral, resp. rate 16, weight 59.7 kg (131 lb 9.8 oz), SpO2 97.00%. No results found. Results for orders placed during the hospital encounter of 01/01/14 (from the past 72 hour(s))  CBC WITH DIFFERENTIAL     Status: Abnormal   Collection Time    01/02/14  5:28 AM      Result Value Range   WBC 5.8  4.0 - 10.5 K/uL   RBC 3.45 (*) 4.22 - 5.81 MIL/uL   Hemoglobin 11.1 (*) 13.0 - 17.0 g/dL   HCT 33.3 (*) 39.0 - 52.0 %   MCV 96.5  78.0 - 100.0 fL   MCH 32.2  26.0 - 34.0 pg   MCHC 33.3  30.0 - 36.0 g/dL   RDW 14.2  11.5 - 15.5 %   Platelets 195  150 - 400 K/uL   Neutrophils Relative % 65  43 - 77 %   Neutro Abs 3.8  1.7 - 7.7 K/uL   Lymphocytes Relative 11 (*) 12 - 46 %   Lymphs Abs 0.7  0.7 - 4.0 K/uL   Monocytes Relative 24 (*) 3 - 12 %   Monocytes Absolute 1.4 (*) 0.1 - 1.0 K/uL   Eosinophils Relative 0  0 - 5 %   Eosinophils Absolute 0.0  0.0 - 0.7 K/uL   Basophils Relative  0  0 - 1 %   Basophils Absolute 0.0  0.0 - 0.1 K/uL  COMPREHENSIVE METABOLIC PANEL     Status: Abnormal   Collection Time    01/02/14  5:28 AM      Result Value Range   Sodium 139  137 - 147 mEq/L   Potassium 4.1  3.7 - 5.3 mEq/L   Chloride 102  96 - 112 mEq/L   CO2 27  19 - 32 mEq/L   Glucose, Bld 110 (*) 70 - 99 mg/dL   BUN 23  6 - 23 mg/dL   Creatinine, Ser 0.90  0.50 - 1.35 mg/dL   Calcium 8.5  8.4 - 10.5 mg/dL   Total Protein 6.3  6.0 - 8.3 g/dL   Albumin 2.5 (*) 3.5 - 5.2 g/dL   AST 21  0 - 37 U/L   ALT 14  0 - 53 U/L   Alkaline Phosphatase 75  39 - 117 U/L   Total Bilirubin 0.5  0.3 - 1.2 mg/dL   GFR calc non Af Amer 76 (*) >90 mL/min   GFR calc Af Amer 88 (*) >90 mL/min   Comment: (NOTE)  The eGFR has been calculated using the CKD EPI equation.     This calculation has not been validated in all clinical situations.     eGFR's persistently <90 mL/min signify possible Chronic Kidney     Disease.  DIGOXIN LEVEL     Status: Abnormal   Collection Time    01/02/14  5:28 AM      Result Value Range   Digoxin Level 0.7 (*) 0.8 - 2.0 ng/mL  URINALYSIS, ROUTINE W REFLEX MICROSCOPIC     Status: Abnormal   Collection Time    01/02/14  5:55 AM      Result Value Range   Color, Urine RED (*) YELLOW   Comment: BIOCHEMICALS MAY BE AFFECTED BY COLOR   APPearance CLOUDY (*) CLEAR   Specific Gravity, Urine 1.016  1.005 - 1.030   pH 6.5  5.0 - 8.0   Glucose, UA NEGATIVE  NEGATIVE mg/dL   Hgb urine dipstick LARGE (*) NEGATIVE   Bilirubin Urine NEGATIVE  NEGATIVE   Ketones, ur NEGATIVE  NEGATIVE mg/dL   Protein, ur 30 (*) NEGATIVE mg/dL   Urobilinogen, UA 0.2  0.0 - 1.0 mg/dL   Nitrite NEGATIVE  NEGATIVE   Leukocytes, UA MODERATE (*) NEGATIVE  URINE MICROSCOPIC-ADD ON     Status: None   Collection Time    01/02/14  5:55 AM      Result Value Range   Squamous Epithelial / LPF RARE  RARE   WBC, UA 7-10  <3 WBC/hpf   RBC / HPF TOO NUMEROUS TO COUNT  <3 RBC/hpf   Bacteria, UA  RARE  RARE   Urine-Other FEW YEAST        HENT:  Head: Normocephalic.  Eyes: EOM are normal.  Neck: Normal range of motion. Neck supple. No thyromegaly present.  Cardiovascular:  Irreg Irreg, rate increased Respiratory: Effort normal and breath sounds normal. No respiratory distress.  GI: Soft. Bowel sounds are normal. He exhibits no distension.  Neurological: He is alert.  Mood is flat and somewhat slow to process. He had limited recall of recent events. He was able to state his name, age. Follows simple commands  Skin: Skin is warm and dry.  4/5 bilateral deltoid, bicep, tricep, grip  4/5 in the right hip flexor knee extensor ankle dorsiflexor plantar flexion  3 minus/5 in the left hip flexor knee extensor ankle dorsiflexor plantar flexor  Sensation difficult to assess secondary to poor concentration and attention.  Assessment/Plan: 1. 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 Physiatrist is providing close team supervision and 24 hour management of active medical problems listed below. Physiatrist and rehab team continue to assess barriers to discharge/monitor patient progress toward functional and medical goals. FIM:                   Comprehension Comprehension Mode: Auditory Comprehension: 3-Understands basic 50 - 74% of the time/requires cueing 25 - 50%  of the time  Expression Expression Mode: Verbal Expression: 3-Expresses basic 50 - 74% of the time/requires cueing 25 - 50% of the time. Needs to repeat parts of sentences.  Social Interaction Social Interaction: 3-Interacts appropriately 50 - 74% of the time - May be physically or verbally inappropriate.  Problem Solving Problem Solving: 3-Solves basic 50 - 74% of the time/requires cueing 25 - 49% of the time  Memory  Memory:  2-Recognizes or recalls 25 - 49% of the time/requires cueing 51 - 75% of the time  Medical Problem List and Plan:  Acute left parietal infarct with decreased balance, possible aphasia as well as decreased cognition. Also has chronic left lower extremity weakness from right corona radiata infarct 2013  1. DVT Prophylaxis/Anticoagulation: Pharmaceutical: Xarelto  2. Pain Management: Will add Sportscreme to help with knee stiffness.  3. Mood: LSCW to follow for support.  4. Neuropsych: This patient is not at this time capable of making decisions on his own behalf.  5. A fib: Will continue to monitor HR with bid checks. Continue xarelto, cardizem and lanoxin.  6. Hypotension: On Cardizem and lanoxin for HR control. Will monitor for trends--may need cardizem changed back to 60 mg q 6hours. Need to avoid hypotension. Will increase IVF for hydration and then change to evenings due to dysphagia diet with evidence of dehydration.  7. SIRS due to H Flu and questionable UTI: Has completed antibiotics and Tamiflu as of 12/31/13.  8. Dysphagia: Continue Dysphagia 1 with nectar thick fluids. Push po fluids.  9. Pre-diabetic: Hgb A1c-5.8. Will have RD educate on CM diet.  10. Lethargy:probable multifactorial, possible UTI, check culture -increase IVF, check am labs  - follow sleep pattern   LOS (Days) 1 A FACE TO FACE EVALUATION WAS PERFORMED  KIRSTEINS,ANDREW E 01/02/2014, 8:47 AM

## 2014-01-03 ENCOUNTER — Inpatient Hospital Stay (HOSPITAL_COMMUNITY): Payer: Medicare Other

## 2014-01-03 ENCOUNTER — Inpatient Hospital Stay (HOSPITAL_COMMUNITY): Payer: Medicare Other | Admitting: Physical Therapy

## 2014-01-03 DIAGNOSIS — I6330 Cerebral infarction due to thrombosis of unspecified cerebral artery: Secondary | ICD-10-CM

## 2014-01-03 DIAGNOSIS — G8110 Spastic hemiplegia affecting unspecified side: Secondary | ICD-10-CM

## 2014-01-03 LAB — URINE CULTURE: Colony Count: >=100000

## 2014-01-03 MED ORDER — SODIUM CHLORIDE 0.9 % IV SOLN
INTRAVENOUS | Status: DC
Start: 2014-01-03 — End: 2014-01-03
  Filled 2014-01-03: qty 1000

## 2014-01-03 MED ORDER — SODIUM CHLORIDE 0.9 % IV SOLN
INTRAVENOUS | Status: DC
  Administered 2014-01-03 – 2014-01-04 (×2): via INTRAVENOUS
  Filled 2014-01-03 (×2): qty 1000

## 2014-01-03 NOTE — Progress Notes (Signed)
Speech Language Pathology Daily Session Note  Patient Details  Name: [name] MRN: [id number] Date of Birth: 08/25/1927  Today's Date: 01/03/2014 Time: 1140-1230 Time Calculation (min): 50 min  Short Term Goals: Week 1: SLP Short Term Goal 1 (Week 1): Pt will consume Dys 1 textures and nectar thick liquids with Min cues to utilize safe swallowing strategies to minimize aspiration risk SLP Short Term Goal 2 (Week 1): Pt will consume therapeutic trials of thin liquid with minimal overt s/s of aspiration to determine readiness for diet advancement SLP Short Term Goal 3 (Week 1): Pt will sustain attention to basic functional task for 30 seconds with Max cues SLP Short Term Goal 4 (Week 1): Pt will use environmental clues to demonstrate orientation x4 with Max cues SLP Short Term Goal 5 (Week 1): Pt will follow one-step commands during functional task with Max cues SLP Short Term Goal 6 (Week 1): Pt will verbally communicate wants/needs during functional task with Max cues  Skilled Therapeutic Interventions: Skilled treatment focused on swallowing and cognitive goals. SLP facilitated session with skilled observation of lunch meal, consisting of Dys 1 textures as well as advanced trials of Dys 2 textures and thin liquids. Pt with improved LOA and sustained attention today, oriented x4, requiring Mod cues for redirection to meal. Mod cues were provided for safe swallowing strategies, with wife present, engaged, and approved to provide supervision during meals. Pt exhibited wet vocal quality x1 with thin liquids, which returned to baseline with a cued throat clear. Pt exhibited mildly prolonged mastication with Dys 2 textures, although with adequate clearance. Given fluctuating mentation, recommend additional PO trials with trial meal tray prior to diet advancement. Continue plan of care.   FIM:  Comprehension Comprehension Mode: Auditory Comprehension: 3-Understands basic 50 - 74% of the  time/requires cueing 25 - 50%  of the time Expression Expression Mode: Verbal Expression: 3-Expresses basic 50 - 74% of the time/requires cueing 25 - 50% of the time. Needs to repeat parts of sentences. Social Interaction Social Interaction: 2-Interacts appropriately 25 - 49% of time - Needs frequent redirection. Problem Solving Problem Solving: 3-Solves basic 50 - 74% of the time/requires cueing 25 - 49% of the time Memory Memory: 2-Recognizes or recalls 25 - 49% of the time/requires cueing 51 - 75% of the time FIM - Eating Eating Activity: 5: Needs verbal cues/supervision;5: Set-up assist for open containers;4: Helper checks for pocketed food  Pain Pain Assessment Pain Assessment: No/denies pain  Therapy/Group: Individual Therapy   Germain Osgood, M.A. CCC-SLP [phone]   Germain Osgood 01/03/2014, 2:20 PM

## 2014-01-03 NOTE — Progress Notes (Signed)
Occupational Therapy Session Note  Patient Details  Name: [name] MRN: [id number] Date of Birth: Apr 22, 1927  Today's Date: 01/03/2014 Time: 0731-0830 Time Calculation (min): 59 min  Short Term Goals: Week 1:  OT Short Term Goal 1 (Week 1): Patient will complete LB dressing with mod assist and mod cues for sequencing OT Short Term Goal 2 (Week 1): Patient will complete bathing with mod assist and mod cueing OT Short Term Goal 3 (Week 1): Patient will complete toilet transfer with min assist OT Short Term Goal 4 (Week 1): Patient will sequence dressing task with mod cueing  OT Short Term Goal 5 (Week 1): Patient will demonstrate sustained attention to ADL task without cues  Skilled Therapeutic Interventions/Progress Updates:    Pt seen for ADL retraining with focus on dynamic standing balance, activity tolerance, and functional transfers. Pt received supine in bed with wife present. Pt much more alert this AM and oriented x4. Pt required cues of encouragement for therapy d/t pain in knees. Pt ambulated with RW to walk-in shower with min assist and increased time. Cues for upright posture during functional ambulation and bathing. Pt required min assist for standing balance during bathing.Completed dressing while sitting in w/c requiring max assist as pt attempted several techniques for threading BLE into pants however unable to secondary to increased pain in bil knees. Pt with 1 cue for sequencing with threading shirts (wears s/s and l/s shirt). Pt fatigued at end of session however agreeable to stay sitting in w/c for breakfast. Pt's wife very excited by pt's increased alertness and participation this AM.   Therapy Documentation Precautions:  Precautions Precautions: Fall Restrictions Weight Bearing Restrictions: No General:   Vital Signs:   Pain: Pt reports pain at knees with movement.   See FIM for current functional status  Therapy/Group: Individual Therapy  Duayne Cal 01/03/2014, 10:24 AM

## 2014-01-03 NOTE — Progress Notes (Signed)
Physical Therapy Session Note  Patient Details  Name: [name] MRN: [id number] Date of Birth: 04-09-1927  Today's Date: 01/03/2014 Time: 0900-1000 and 1300-1330 Time Calculation (min): 60 min and 30 min  Short Term Goals: Week 1:  PT Short Term Goal 1 (Week 1): Pt to perform supine<>sit with min A. PT Short Term Goal 2 (Week 1): Pt to perform sit<>stand with min A. PT Short Term Goal 3 (Week 1): Pt to perform bed<>chair with min A. PT Short Term Goal 4 (Week 1): Pt to perform w/c mobility x25' in controlled environment with max A. PT Short Term Goal 5 (Week 1): Pt to perform gait x50' in controlled environment with rolling walker and min guard.  Skilled Therapeutic Interventions/Progress Updates:    Treatment Session 1: Pt received seated in w/c, accompanied by wife for initial 5 min of session. Pt agreeable to session. Oriented x2 to self and place; able to orient pt to date with max cueing for use of visual aid. Pt arousal significantly improved since last session, as pt remained awake and alert for entirety of session. Per pt request, pt brushed teeth seated in w/c at sink with assist for putting toothpaste on toothbrush. Despite max coaxing, pt refused to perform teeth brushing in standing at sink. Performed stand-pivot from w/c<>bed with rolling walker and mod A (for anterior weight shift, bilat hip extension with sit>stand). Performed w/c mobility x55' in controlled environment using bilat UE's/LE's with min A for LUE technique. Pt able to recall technique for locking/unlocking w/c brakes with min verbal cueing.   Toilet transfer (stand-pivot without device) to/from raised toilet seat with mod A and mod verbal cues for hand placement. Performed gait x40' in controlled environment with rolling walker, min A, and mod encouragement; manual facilitation at L axilla and R ribs for upright posture. Therapist departed with pt seated in w/c with quick release belt on for safety and all needs  within reach.  Treatment Session 2:  Pt received seated in w/c with wife present; agreeable to session. Oriented x3 to self, place, and time without cueing. Pt exhibits increased fatigue, as exhibited by increased processing time required to answer questions, perform functional tasks. Self-propulsion of w/c x75' in controlled environment with bilat UE's/LE's, min A to increase LUE excursion on hand rim, mod encouragement. Negotiation of three 4.5" stairs with bilat rails, step-to pattern; min A to ascend, mod A to descend, and max encouragement throughout secondary to pt anxiety. Transported pt to room in w/c secondary to pt fatigue. Therapist departed with pt seated in w/c with quick release belt on for safety and all needs within reach.  Therapy Documentation Precautions:  Precautions Precautions: Fall Restrictions Weight Bearing Restrictions: No Pain: Pain Assessment Pain Assessment: FLACC Pain Score: 3  Pain Type: Acute pain Pain Location: Knee Pain Orientation: Right;Left Pain Descriptors / Indicators: Aching Pain Onset: Other (Comment) (initially painful with activity, then decreases with ambulation) Pain Intervention(s): Repositioned;Ambulation/increased activity Multiple Pain Sites: No Locomotion : Ambulation Ambulation/Gait Assistance: 4: Min assist Wheelchair Mobility Distance: 55   See FIM for current functional status  Therapy/Group: Individual Therapy  Indea Dearman, Malva Cogan 01/03/2014, 12:41 PM

## 2014-01-03 NOTE — Patient Care Conference (Signed)
Inpatient RehabilitationTeam Conference and Plan of Care Update Date: 01/02/2014   Time: 10:45 AM    Patient Name: [name]      Medical Record Number: [id number]  Date of Birth: Jul 11, 1927 Sex: Male         Room/Bed: 4M06C/4M06C-01 Payor Info: Payor: [employer] / Plan: [employer] MEDICARE COMPLETE / Product Type: *No Product type* /    Admitting Diagnosis: R CVA  Admit Date/Time:  01/01/2014  5:42 PM Admission Comments: No comment available   Primary Diagnosis:  CVA (cerebral infarction) Principal Problem: CVA (cerebral infarction)  Patient Active Problem List   Diagnosis Date Noted  . Influenza with other respiratory manifestations 01/01/2014  . Prediabetes 01/01/2014  . CVA (cerebral infarction) 01/01/2014  . Atrial fibrillation 12/25/2013  . Hypothermia 03/23/2013  . Constipation 10/16/2012  . Preventative health care 09/17/2012  . Nocturia 09/17/2012  . UTI (lower urinary tract infection) 08/22/2012  . Leukocytosis 08/22/2012  . Pseudogout 08/22/2012  . Encephalopathy 08/21/2012  . Left knee pain 08/21/2012  . Prerenal azotemia 08/21/2012  . CVA (cerebral vascular accident)-Acute right small nonhemorrhagic infarct seen on MRI 08/17/2012  . Lacunar stroke 04/07/2011  . Pleural effusion 04/07/2011  . 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 Long term current use of anticoagulant 04/07/2011  . NEOPLASM, MALIGNANT, BLADDER 04/10/2009  . HYPERLIPIDEMIA 04/10/2009  . ANEMIA-NOS 04/10/2009  . 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 ANXIETY 04/10/2009  . HYPERTENSION 04/10/2009  . COPD 04/10/2009  . TRANSIENT ISCHEMIC ATTACK, HX OF 04/10/2009    Expected Discharge Date: Expected Discharge Date: 01/18/14  Team Members Present: Physician leading conference: Dr. Alysia Penna Social Worker Present: Ovidio Kin, LCSW;Jenny Cailah Reach, LCSW Nurse Present: Other (comment) Rayetta Pigg, RN) PT Present: Georjean Mode, PT;Other (comment) Benjie Karvonen [name], PT) OT  Present: Gareth Morgan, OT;Kris Leia Alf, OT SLP Present: Germain Osgood, SLP PPS Coordinator present : Daiva Nakayama, RN, CRRN     Current Status/Progress Goal Weekly Team Focus  Medical   bilateral knee pain, poor attn and concentration, dehydrations  upgrade cog status, improve pain c/os  pain meds, hydrate   Bowel/Bladder   cont of bowel (per report); chronic condom cath use at [facility] and wears depends in AM; hx of bladder cancer, urine pink-tinged  remain cont of bowel; continue home bladder regimen  monitor for incontinence, diarrhea, constipation   Swallow/Nutrition/ Hydration   Dys 1 textures and nectar thick liquids with Max A  Min with least restrictive PO  trials of water and Dys 2 textures, maintain alertness for PO intake   ADL's   total assist LB dressing, max-mod assist functional transfers, max assist UB dressing, total assist bathing (decreased arousal noted on eval)  supervision overall  NMR, cognition, functional transfers, activity tolerance, standing balance, strengthening   Mobility   Max A with basic transfers, mod A with gait; unable to attempt stairs  Supervision overall  Tansfer/gait training, bed mobility, activity tolerance, initiation of stair training   Communication   minimal verbal output limited to on-word utterances, appeared to be impacted by decreased LOA  Min  communicate wants/needs, increase communication attempts   Safety/Cognition/ Behavioral Observations  Max A for brief periods of alertness and sustained attention (<10 seconds)  Min  increase alertness and sustained attention in order to focus on differential diagnosis of higher level cognitive areas   Pain   bilateral knee pain - muscle rub used  pain level less than 3/10  assess pain qshift and prn   Skin   scrotal/perineal area red -  nystatin powder used; mild rash to back  no infection or skin breakdown while in rehab  assess skin qshift and prn; keep skin dry, control incontinence    Rehab  Goals Patient on target to meet rehab goals: Yes Rehab Goals Revised: Pt just being evaluated today on day of conference *[place] and progress notes for long and short-term goals.  Barriers to Discharge: see above    Possible Resolutions to Barriers:  see above    Discharge Planning/Teaching Needs:  Wife plans to provide pt with 24/7 supervision at discharge  Wife can participate in family education, as needed   Team Discussion:  Pt had a previous stroke in 2013, but pt had improved and was back to golfing.  Pt admitted with flu, altered mental status, and CVA.  Pt has good strength, but team continues to assess pt's sensation.  Pt is lethargic and MD feels this may be complicated by possible UTI, dehydration, in addition to the stroke.  Pt's goals are set for supervision and his wife was already providing this prior to pt's admission.  Revisions to Treatment Plan:  None   Continued Need for Acute Rehabilitation Level of Care: The patient requires daily medical management by a physician with specialized training in physical medicine and rehabilitation for the following conditions: Daily direction of a multidisciplinary physical rehabilitation program to ensure safe treatment while eliciting the highest outcome that is of practical value to the patient.: Yes Daily medical management of patient stability for increased activity during participation in an intensive rehabilitation regime.: Yes Daily analysis of laboratory values and/or radiology reports with any subsequent need for medication adjustment of medical intervention for : Neurological problems;Other  Teighlor Korson, [name] 01/03/2014, 9:33 AM

## 2014-01-03 NOTE — Progress Notes (Signed)
Social Work Patient ID: [name], male   DOB: 1927/04/15, 80 y.o.   MRN: [id number]  CSW met with pt and his son to complete assessment and to update them on team conference.  Son stated that pt was more tired today than in previous days.  RN and nurse tech both came to assess pt and got him back into bed so he could rest, as pt was sleeping slumped over in his w/c.  Son was able to receive update on team conference and will pass information on to his mother.  Pt's targeted d/c date is 01-18-14 and son stated that his mother would be able to provided pt with 24/7 supervision.  They have some extended family nearby, but son lives in [place], [place].  He is able to come to assist parents as needed, however.  Son expressed understanding of team conference and how services are delivered on Inpatient Rehab as pt was here after his previous stroke in 2013.  Son is hopeful that pt will rehabilitate as well as he did last admission.  CSW will continue to follow pt and meet pt's wife.

## 2014-01-03 NOTE — Progress Notes (Signed)
80 y.o. 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 He was admitted 12/25/2013 with altered mental status and low grade fever of 100 Fahrenheit and reported hematuria. Cranial CT scan negative and MRI of the brain showed punctate acute infarct in the high left parietal lobe as well as extensive chronic ischemic changes. Patient's Xarelto held for a short time secondary to hematuria. He was started on IV rocephin due to SIRS as well as Tamiflu as he tested positive for type A influenza. He was placed on Dysphagia 1 nectar thick liquid diet per ST recommendations. Patient with difficulty with processing with easy distractibility, as well as problems with initiation.   Subjective/Complaints: Wife noted more lethargy yesterday More alert today Knees feel better  Review of Systems - Negative except for knee pain and weakness  Objective: Vital Signs: Blood pressure 131/76, pulse 102, temperature 98.1 F (36.7 C), temperature source Oral, resp. rate 16, weight 59.7 kg (131 lb 9.8 oz), SpO2 99.00%. No results found. Results for orders placed during the hospital encounter of 01/01/14 (from the past 72 hour(s))  CBC WITH DIFFERENTIAL     Status: Abnormal   Collection Time    01/02/14  5:28 AM      Result Value Range   WBC 5.8  4.0 - 10.5 K/uL   RBC 3.45 (*) 4.22 - 5.81 MIL/uL   Hemoglobin 11.1 (*) 13.0 - 17.0 g/dL   HCT 21.1 (*) 94.1 - 74.0 %   MCV 96.5  78.0 - 100.0 fL   MCH 32.2  26.0 - 34.0 pg   MCHC 33.3  30.0 - 36.0 g/dL   RDW 81.4  48.1 - 85.6 %   Platelets 195  150 - 400 K/uL   Neutrophils Relative % 65  43 - 77 %   Neutro Abs 3.8  1.7 - 7.7 K/uL   Lymphocytes Relative 11 (*) 12 - 46 %   Lymphs Abs 0.7  0.7 - 4.0 K/uL   Monocytes Relative 24 (*) 3 - 12 %   Monocytes Absolute 1.4 (*) 0.1 - 1.0 K/uL   Eosinophils Relative 0  0 - 5 %   Eosinophils Absolute  0.0  0.0 - 0.7 K/uL   Basophils Relative 0  0 - 1 %   Basophils Absolute 0.0  0.0 - 0.1 K/uL  COMPREHENSIVE METABOLIC PANEL     Status: Abnormal   Collection Time    01/02/14  5:28 AM      Result Value Range   Sodium 139  137 - 147 mEq/L   Potassium 4.1  3.7 - 5.3 mEq/L   Chloride 102  96 - 112 mEq/L   CO2 27  19 - 32 mEq/L   Glucose, Bld 110 (*) 70 - 99 mg/dL   BUN 23  6 - 23 mg/dL   Creatinine, Ser 3.14  0.50 - 1.35 mg/dL   Calcium 8.5  8.4 - 97.0 mg/dL   Total Protein 6.3  6.0 - 8.3 g/dL   Albumin 2.5 (*) 3.5 - 5.2 g/dL   AST 21  0 - 37 U/L   ALT 14  0 - 53 U/L   Alkaline Phosphatase 75  39 - 117 U/L   Total Bilirubin 0.5  0.3 - 1.2 mg/dL   GFR calc non Af Amer 76 (*) >90 mL/min   GFR calc Af Amer 88 (*) >90 mL/min  Comment: (NOTE)     The eGFR has been calculated using the CKD EPI equation.     This calculation has not been validated in all clinical situations.     eGFR's persistently <90 mL/min signify possible Chronic Kidney     Disease.  DIGOXIN LEVEL     Status: Abnormal   Collection Time    01/02/14  5:28 AM      Result Value Range   Digoxin Level 0.7 (*) 0.8 - 2.0 ng/mL  URINE CULTURE     Status: None   Collection Time    01/02/14  5:55 AM      Result Value Range   Specimen Description URINE, CLEAN CATCH     Special Requests NONE     Culture  Setup Time       Value: 01/02/2014 11:02     Performed at [facility]       Value: >=100,000 COLONIES/ML     Performed at [facility]   Culture       Value: Multiple bacterial morphotypes present, none predominant. Suggest appropriate recollection if clinically indicated.     Performed at [facility]   Report Status 01/03/2014 FINAL    URINALYSIS, ROUTINE W REFLEX MICROSCOPIC     Status: Abnormal   Collection Time    01/02/14  5:55 AM      Result Value Range   Color, Urine RED (*) YELLOW   Comment: BIOCHEMICALS MAY BE AFFECTED BY COLOR   APPearance CLOUDY (*) CLEAR    Specific Gravity, Urine 1.016  1.005 - 1.030   pH 6.5  5.0 - 8.0   Glucose, UA NEGATIVE  NEGATIVE mg/dL   Hgb urine dipstick LARGE (*) NEGATIVE   Bilirubin Urine NEGATIVE  NEGATIVE   Ketones, ur NEGATIVE  NEGATIVE mg/dL   Protein, ur 30 (*) NEGATIVE mg/dL   Urobilinogen, UA 0.2  0.0 - 1.0 mg/dL   Nitrite NEGATIVE  NEGATIVE   Leukocytes, UA MODERATE (*) NEGATIVE  URINE MICROSCOPIC-ADD ON     Status: None   Collection Time    01/02/14  5:55 AM      Result Value Range   Squamous Epithelial / LPF RARE  RARE   WBC, UA 7-10  <3 WBC/hpf   RBC / HPF TOO NUMEROUS TO COUNT  <3 RBC/hpf   Bacteria, UA RARE  RARE   Urine-Other FEW YEAST        HENT:  Head: Normocephalic.  Eyes: EOM are normal.  Neck: Normal range of motion. Neck supple. No thyromegaly present.  Cardiovascular:  Irreg Irreg, rate increased Respiratory: Effort normal and breath sounds normal. No respiratory distress.  GI: Soft. Bowel sounds are normal. He exhibits no distension.  Neurological: He is alert.  Mood is flat and somewhat slow to process. He had limited recall of recent events. He was able to state his name, age. Follows simple commands  Skin: Skin is warm and dry.  4/5 bilateral deltoid, bicep, tricep, grip  4/5 in the right hip flexor knee extensor ankle dorsiflexor plantar flexion  3 minus/5 in the left hip flexor knee extensor ankle dorsiflexor plantar flexor  Sensation difficult to assess secondary to poor concentration and attention.   Assessment/Plan: 1. 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 Physiatrist is providing close team supervision  and 24 hour management of active medical problems listed below. Physiatrist and rehab team continue to assess barriers to discharge/monitor patient progress toward functional and medical  goals. FIM: FIM - Bathing Bathing Steps Patient Completed: Front perineal area Bathing: 1: Total-Patient completes 0-2 of 10 parts or less than 25%  FIM - Upper Body Dressing/Undressing Upper body dressing/undressing steps patient completed: Thread/unthread left sleeve of pullover shirt/dress Upper body dressing/undressing: 2: Max-Patient completed 25-49% of tasks FIM - Lower Body Dressing/Undressing Lower body dressing/undressing: 1: Total-Patient completed less than 25% of tasks  FIM - Toileting Toileting: 1: Total-Patient completed zero steps, helper did all 3  FIM - Radio producer Devices: Walker;Elevated toilet seat Toilet Transfers: 3-To toilet/BSC: Mod A (lift or lower assist);3-From toilet/BSC: Mod A (lift or lower assist)  FIM - Control and instrumentation engineer Devices: Walker;Arm rests Bed/Chair Transfer: 3: Supine > Sit: Mod A (lifting assist/Pt. 50-74%/lift 2 legs  FIM - Locomotion: Wheelchair Distance: 5 Locomotion: Wheelchair: 1: Total Assistance/staff pushes wheelchair (Pt<25%) FIM - Locomotion: Ambulation Locomotion: Ambulation Assistive Devices: Administrator Ambulation/Gait Assistance: 4: Min assist Locomotion: Ambulation: 1: Travels less than 50 ft with minimal assistance (Pt.>75%)  Comprehension Comprehension Mode: Auditory Comprehension: 3-Understands basic 50 - 74% of the time/requires cueing 25 - 50%  of the time  Expression Expression Mode: Verbal Expression: 3-Expresses basic 50 - 74% of the time/requires cueing 25 - 50% of the time. Needs to repeat parts of sentences.  Social Interaction Social Interaction: 2-Interacts appropriately 25 - 49% of time - Needs frequent redirection.  Problem Solving Problem Solving: 3-Solves basic 50 - 74% of the time/requires cueing 25 - 49% of the time  Memory Memory: 2-Recognizes or recalls 25 - 49% of the time/requires cueing 51 - 75% of the time  Medical Problem  List and Plan:  Acute left parietal infarct with decreased balance, possible aphasia as well as decreased cognition. Also has chronic left lower extremity weakness from right corona radiata infarct 2013  1. DVT Prophylaxis/Anticoagulation: Pharmaceutical: Xarelto  2. Pain Management: Will add Sportscreme to help with knee stiffness.  3. Mood: LSCW to follow for support.  4. Neuropsych: This patient is not at this time capable of making decisions on his own behalf.  5. A fib: Will continue to monitor HR with bid checks. Continue xarelto, cardizem and lanoxin.  6. Hypotension: On Cardizem and lanoxin for HR control.dig level low increase to .25 Will monitor for trends--if no better restart cardizem but reduce  to 30 mg q 6hours. Need to avoid hypotension. Will increase IVF for hydration and then change to evenings due to dysphagia diet with evidence of dehydration.  7. SIRS due to H Flu and questionable UTI: Has completed antibiotics and Tamiflu as of 12/31/13.  8. Dysphagia: Continue Dysphagia 1 with nectar thick fluids. Push po fluids.  9. Pre-diabetic: Hgb A1c-5.8. Will have RD educate on CM diet.  10. Lethargy:probable multifactorial, possible UTI, check culture -increase IVF, check am labs  - follow sleep pattern   LOS (Days) 2 A FACE TO FACE EVALUATION WAS PERFORMED  Charlett Blake 01/03/2014, 8:48 AM   80 y.o. 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 He was admitted 12/25/2013 with altered mental status and low grade fever of 100 Fahrenheit and reported hematuria. Cranial CT scan negative and MRI of the brain showed punctate acute  infarct in the high left parietal lobe as well as extensive chronic ischemic changes. Patient's Xarelto held for a short time secondary to hematuria. He was started on IV rocephin due to SIRS as well as Tamiflu as he tested  positive for type A influenza. He was placed on Dysphagia 1 nectar thick liquid diet per ST recommendations. Patient with difficulty with processing with easy distractibility, as well as problems with initiation.   Subjective/Complaints: Wife noted more lethargy yesterday No pt c/o Intermittent cough Pt seated at EOB, starting OT  Objective: Vital Signs: Blood pressure 131/76, pulse 102, temperature 98.1 F (36.7 C), temperature source Oral, resp. rate 16, weight 59.7 kg (131 lb 9.8 oz), SpO2 99.00%. No results found. Results for orders placed during the hospital encounter of 01/01/14 (from the past 72 hour(s))  CBC WITH DIFFERENTIAL     Status: Abnormal   Collection Time    01/02/14  5:28 AM      Result Value Range   WBC 5.8  4.0 - 10.5 K/uL   RBC 3.45 (*) 4.22 - 5.81 MIL/uL   Hemoglobin 11.1 (*) 13.0 - 17.0 g/dL   HCT 33.3 (*) 39.0 - 52.0 %   MCV 96.5  78.0 - 100.0 fL   MCH 32.2  26.0 - 34.0 pg   MCHC 33.3  30.0 - 36.0 g/dL   RDW 14.2  11.5 - 15.5 %   Platelets 195  150 - 400 K/uL   Neutrophils Relative % 65  43 - 77 %   Neutro Abs 3.8  1.7 - 7.7 K/uL   Lymphocytes Relative 11 (*) 12 - 46 %   Lymphs Abs 0.7  0.7 - 4.0 K/uL   Monocytes Relative 24 (*) 3 - 12 %   Monocytes Absolute 1.4 (*) 0.1 - 1.0 K/uL   Eosinophils Relative 0  0 - 5 %   Eosinophils Absolute 0.0  0.0 - 0.7 K/uL   Basophils Relative 0  0 - 1 %   Basophils Absolute 0.0  0.0 - 0.1 K/uL  COMPREHENSIVE METABOLIC PANEL     Status: Abnormal   Collection Time    01/02/14  5:28 AM      Result Value Range   Sodium 139  137 - 147 mEq/L   Potassium 4.1  3.7 - 5.3 mEq/L   Chloride 102  96 - 112 mEq/L   CO2 27  19 - 32 mEq/L   Glucose, Bld 110 (*) 70 - 99 mg/dL   BUN 23  6 - 23 mg/dL   Creatinine, Ser 0.90  0.50 - 1.35 mg/dL   Calcium 8.5  8.4 - 10.5 mg/dL   Total Protein 6.3  6.0 - 8.3 g/dL   Albumin 2.5 (*) 3.5 - 5.2 g/dL   AST 21  0 - 37 U/L   ALT 14  0 - 53 U/L   Alkaline Phosphatase 75  39 - 117 U/L    Total Bilirubin 0.5  0.3 - 1.2 mg/dL   GFR calc non Af Amer 76 (*) >90 mL/min   GFR calc Af Amer 88 (*) >90 mL/min   Comment: (NOTE)     The eGFR has been calculated using the CKD EPI equation.     This calculation has not been validated in all clinical situations.     eGFR's persistently <90 mL/min signify possible Chronic Kidney     Disease.  DIGOXIN LEVEL     Status: Abnormal   Collection Time    01/02/14  5:28  AM      Result Value Range   Digoxin Level 0.7 (*) 0.8 - 2.0 ng/mL  URINE CULTURE     Status: None   Collection Time    01/02/14  5:55 AM      Result Value Range   Specimen Description URINE, CLEAN CATCH     Special Requests NONE     Culture  Setup Time       Value: 01/02/2014 11:02     Performed at [facility]       Value: >=100,000 COLONIES/ML     Performed at [facility]   Culture       Value: Multiple bacterial morphotypes present, none predominant. Suggest appropriate recollection if clinically indicated.     Performed at [facility]   Report Status 01/03/2014 FINAL    URINALYSIS, ROUTINE W REFLEX MICROSCOPIC     Status: Abnormal   Collection Time    01/02/14  5:55 AM      Result Value Range   Color, Urine RED (*) YELLOW   Comment: BIOCHEMICALS MAY BE AFFECTED BY COLOR   APPearance CLOUDY (*) CLEAR   Specific Gravity, Urine 1.016  1.005 - 1.030   pH 6.5  5.0 - 8.0   Glucose, UA NEGATIVE  NEGATIVE mg/dL   Hgb urine dipstick LARGE (*) NEGATIVE   Bilirubin Urine NEGATIVE  NEGATIVE   Ketones, ur NEGATIVE  NEGATIVE mg/dL   Protein, ur 30 (*) NEGATIVE mg/dL   Urobilinogen, UA 0.2  0.0 - 1.0 mg/dL   Nitrite NEGATIVE  NEGATIVE   Leukocytes, UA MODERATE (*) NEGATIVE  URINE MICROSCOPIC-ADD ON     Status: None   Collection Time    01/02/14  5:55 AM      Result Value Range   Squamous Epithelial / LPF RARE  RARE   WBC, UA 7-10  <3 WBC/hpf   RBC / HPF TOO NUMEROUS TO COUNT  <3 RBC/hpf   Bacteria, UA RARE  RARE    Urine-Other FEW YEAST        HENT:  Head: Normocephalic.  Eyes: EOM are normal.  Neck: Normal range of motion. Neck supple. No thyromegaly present.  Cardiovascular:  Irreg Irreg, rate increased Respiratory: Effort normal and breath sounds normal. No respiratory distress.  GI: Soft. Bowel sounds are normal. He exhibits no distension.  Neurological: He is alert.  Mood is flat and somewhat slow to process. He had limited recall of recent events. He was able to state his name, age. Follows simple commands  Skin: Skin is warm and dry.  4/5 bilateral deltoid, bicep, tricep, grip  4/5 in the right hip flexor knee extensor ankle dorsiflexor plantar flexion  3 minus/5 in the left hip flexor knee extensor ankle dorsiflexor plantar flexor  Sensation difficult to assess secondary to poor concentration and attention.  Assessment/Plan: 1. 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 Physiatrist is providing close team supervision and 24 hour management of active medical problems listed below. Physiatrist and rehab team continue to assess barriers to discharge/monitor patient progress toward functional and medical goals. FIM: FIM - Bathing Bathing Steps Patient Completed: Front perineal area Bathing: 1: Total-Patient completes 0-2 of 10 parts or less than 25%  FIM - Upper Body Dressing/Undressing Upper body dressing/undressing steps patient completed: Thread/unthread left sleeve of pullover  shirt/dress Upper body dressing/undressing: 2: Max-Patient completed 25-49% of tasks FIM - Lower Body Dressing/Undressing Lower body dressing/undressing: 1: Total-Patient completed less than 25% of tasks  FIM - Toileting Toileting: 1: Total-Patient completed zero steps, helper did all 3  FIM - Engineer, structural Devices: Walker;Elevated toilet seat Toilet Transfers: 3-To toilet/BSC: Mod A (lift or lower assist);3-From toilet/BSC: Mod A (lift or lower assist)  FIM - Control and instrumentation engineer Devices: Walker;Arm rests Bed/Chair Transfer: 3: Supine > Sit: Mod A (lifting assist/Pt. 50-74%/lift 2 legs  FIM - Locomotion: Wheelchair Distance: 5 Locomotion: Wheelchair: 1: Total Assistance/staff pushes wheelchair (Pt<25%) FIM - Locomotion: Ambulation Locomotion: Ambulation Assistive Devices: Administrator Ambulation/Gait Assistance: 4: Min assist Locomotion: Ambulation: 1: Travels less than 50 ft with minimal assistance (Pt.>75%)  Comprehension Comprehension Mode: Auditory Comprehension: 3-Understands basic 50 - 74% of the time/requires cueing 25 - 50%  of the time  Expression Expression Mode: Verbal Expression: 3-Expresses basic 50 - 74% of the time/requires cueing 25 - 50% of the time. Needs to repeat parts of sentences.  Social Interaction Social Interaction: 2-Interacts appropriately 25 - 49% of time - Needs frequent redirection.  Problem Solving Problem Solving: 3-Solves basic 50 - 74% of the time/requires cueing 25 - 49% of the time  Memory Memory: 2-Recognizes or recalls 25 - 49% of the time/requires cueing 51 - 75% of the time  Medical Problem List and Plan:  Acute left parietal infarct with decreased balance, possible aphasia as well as decreased cognition. Also has chronic left lower extremity weakness from right corona radiata infarct 2013  1. DVT Prophylaxis/Anticoagulation: Pharmaceutical: Xarelto  2. Pain Management: Will add Sportscreme to help with knee stiffness.  3. Mood: LSCW to follow for support.  4. Neuropsych: This patient is not at this time capable of making decisions on his own behalf.  5. A fib: Will continue to monitor HR with bid checks. Continue xarelto, cardizem and lanoxin.  6. Hypotension: On Cardizem and lanoxin for HR  control. Will monitor for trends--may need cardizem changed back to 60 mg q 6hours. Need to avoid hypotension. Will increase IVF for hydration and then change to evenings due to dysphagia diet with evidence of dehydration.  7. SIRS due to H Flu and questionable UTI: Has completed antibiotics and Tamiflu as of 12/31/13.  8. Dysphagia: Continue Dysphagia 1 with nectar thick fluids. Push po fluids.  9. Pre-diabetic: Hgb A1c-5.8. Will have RD educate on CM diet.  10. Lethargy:probable multifactorial, possible UTI, check culture -increase IVF, check am labs  - follow sleep pattern   LOS (Days) 2 A FACE TO FACE EVALUATION WAS PERFORMED  [name] E 01/03/2014, 8:48 AM

## 2014-01-04 ENCOUNTER — Inpatient Hospital Stay (HOSPITAL_COMMUNITY): Payer: Medicare Other | Admitting: Physical Therapy

## 2014-01-04 ENCOUNTER — Inpatient Hospital Stay (HOSPITAL_COMMUNITY): Payer: Medicare Other

## 2014-01-04 ENCOUNTER — Ambulatory Visit (HOSPITAL_COMMUNITY): Payer: Medicare Other | Admitting: Speech Pathology

## 2014-01-04 DIAGNOSIS — I69921 Dysphasia following unspecified cerebrovascular disease: Secondary | ICD-10-CM

## 2014-01-04 LAB — URINALYSIS, ROUTINE W REFLEX MICROSCOPIC
GLUCOSE, UA: NEGATIVE mg/dL
Ketones, ur: 15 mg/dL — AB
NITRITE: NEGATIVE
PH: 6 (ref 5.0–8.0)
Protein, ur: 100 mg/dL — AB
Specific Gravity, Urine: 1.023 (ref 1.005–1.030)
Urobilinogen, UA: 1 mg/dL (ref 0.0–1.0)

## 2014-01-04 LAB — URINE MICROSCOPIC-ADD ON

## 2014-01-04 MED ORDER — DIGOXIN 250 MCG PO TABS
0.2500 mg | ORAL_TABLET | Freq: Every day | ORAL | Status: DC
  Administered 2014-01-04 – 2014-01-05 (×2): 0.25 mg via ORAL
  Filled 2014-01-04 (×4): qty 1

## 2014-01-04 MED ORDER — LEVALBUTEROL HCL 0.63 MG/3ML IN NEBU
0.6300 mg | INHALATION_SOLUTION | Freq: Three times a day (TID) | RESPIRATORY_TRACT | Status: DC | PRN
  Filled 2014-01-04: qty 3

## 2014-01-04 NOTE — Progress Notes (Signed)
Occupational Therapy Session Note  Patient Details  Name: [name] MRN: [id number] Date of Birth: [birth year]  Today's Date: 01/04/2014 Time: 0730-0830 Time Calculation (min): 60 min  Short Term Goals: Week 1:  OT Short Term Goal 1 (Week 1): Patient will complete LB dressing with mod assist and mod cues for sequencing OT Short Term Goal 2 (Week 1): Patient will complete bathing with mod assist and mod cueing OT Short Term Goal 3 (Week 1): Patient will complete toilet transfer with min assist OT Short Term Goal 4 (Week 1): Patient will sequence dressing task with mod cueing  OT Short Term Goal 5 (Week 1): Patient will demonstrate sustained attention to ADL task without cues  Skilled Therapeutic Interventions/Progress Updates:    Pt seen for ADL retraining with focus on activity tolerance, functional transfers, standing balance. Pt received supine in bed asleep and easily aroused. Pt with less pain in bil knees this morning allowing him to complete supine>sit min assist. Completed bathing with min assist for buttocks and min assist standing balance. Pt ambulated to/from bathroom with min assist and cues for upright posture. Completed dressing while sitting in w/c and increased ROM at BLE allowing him to thread BLE into undergarments. Pt fatiguing during dressing requiring rest breaks. Wife began assisting with UB dressing despite discussion and cues from therapist to allow pt to do this himself. Pt motivated by improvements he is having. At end of session pt left sitting in w/c with all needs in reach.   Therapy Documentation Precautions:  Precautions Precautions: Fall Restrictions Weight Bearing Restrictions: No General:   Vital Signs:   Pain: Pt continues to have pain in bil knees with muscle rub applied. Pain not as intense as past few days allowing pt to complete more steps of ADLs himself.  See FIM for current functional status  Therapy/Group: Individual Therapy  Duayne Cal 01/04/2014, 11:26 AM

## 2014-01-04 NOTE — Progress Notes (Addendum)
Subjective/Complaints: Bladder inc, bowel constipation No pt c/o Intermittent cough some sputum Pt seated at EOB, starting OT  Objective: Vital Signs: Blood pressure 116/76, pulse 98, temperature 98.5 F (36.9 C), temperature source Oral, resp. rate 18, weight 59.7 kg (131 lb 9.8 oz), SpO2 97.00%. No results found. Results for orders placed during the hospital encounter of 01/01/14 (from the past 72 hour(s))  CBC WITH DIFFERENTIAL     Status: Abnormal   Collection Time    01/02/14  5:28 AM      Result Value Range   WBC 5.8  4.0 - 10.5 K/uL   RBC 3.45 (*) 4.22 - 5.81 MIL/uL   Hemoglobin 11.1 (*) 13.0 - 17.0 g/dL   HCT 33.3 (*) 39.0 - 52.0 %   MCV 96.5  78.0 - 100.0 fL   MCH 32.2  26.0 - 34.0 pg   MCHC 33.3  30.0 - 36.0 g/dL   RDW 14.2  11.5 - 15.5 %   Platelets 195  150 - 400 K/uL   Neutrophils Relative % 65  43 - 77 %   Neutro Abs 3.8  1.7 - 7.7 K/uL   Lymphocytes Relative 11 (*) 12 - 46 %   Lymphs Abs 0.7  0.7 - 4.0 K/uL   Monocytes Relative 24 (*) 3 - 12 %   Monocytes Absolute 1.4 (*) 0.1 - 1.0 K/uL   Eosinophils Relative 0  0 - 5 %   Eosinophils Absolute 0.0  0.0 - 0.7 K/uL   Basophils Relative 0  0 - 1 %   Basophils Absolute 0.0  0.0 - 0.1 K/uL  COMPREHENSIVE METABOLIC PANEL     Status: Abnormal   Collection Time    01/02/14  5:28 AM      Result Value Range   Sodium 139  137 - 147 mEq/L   Potassium 4.1  3.7 - 5.3 mEq/L   Chloride 102  96 - 112 mEq/L   CO2 27  19 - 32 mEq/L   Glucose, Bld 110 (*) 70 - 99 mg/dL   BUN 23  6 - 23 mg/dL   Creatinine, Ser 0.90  0.50 - 1.35 mg/dL   Calcium 8.5  8.4 - 10.5 mg/dL   Total Protein 6.3  6.0 - 8.3 g/dL   Albumin 2.5 (*) 3.5 - 5.2 g/dL   AST 21  0 - 37 U/L   ALT 14  0 - 53 U/L   Alkaline Phosphatase 75  39 - 117 U/L   Total Bilirubin 0.5  0.3 - 1.2 mg/dL   GFR calc non Af Amer 76 (*) >90 mL/min   GFR calc Af Amer 88 (*) >90 mL/min   Comment: (NOTE)     The eGFR has been calculated using the CKD EPI equation.      This calculation has not been validated in all clinical situations.     eGFR's persistently <90 mL/min signify possible Chronic Kidney     Disease.  DIGOXIN LEVEL     Status: Abnormal   Collection Time    01/02/14  5:28 AM      Result Value Range   Digoxin Level 0.7 (*) 0.8 - 2.0 ng/mL  URINE CULTURE     Status: None   Collection Time    01/02/14  5:55 AM      Result Value Range   Specimen Description URINE, CLEAN CATCH     Special Requests NONE     Culture  Setup Time  Value: 01/02/2014 11:02     Performed at [facility]       Value: >=100,000 COLONIES/ML     Performed at [facility]   Culture       Value: Multiple bacterial morphotypes present, none predominant. Suggest appropriate recollection if clinically indicated.     Performed at [facility]   Report Status 01/03/2014 FINAL    URINALYSIS, ROUTINE W REFLEX MICROSCOPIC     Status: Abnormal   Collection Time    01/02/14  5:55 AM      Result Value Range   Color, Urine RED (*) YELLOW   Comment: BIOCHEMICALS MAY BE AFFECTED BY COLOR   APPearance CLOUDY (*) CLEAR   Specific Gravity, Urine 1.016  1.005 - 1.030   pH 6.5  5.0 - 8.0   Glucose, UA NEGATIVE  NEGATIVE mg/dL   Hgb urine dipstick LARGE (*) NEGATIVE   Bilirubin Urine NEGATIVE  NEGATIVE   Ketones, ur NEGATIVE  NEGATIVE mg/dL   Protein, ur 30 (*) NEGATIVE mg/dL   Urobilinogen, UA 0.2  0.0 - 1.0 mg/dL   Nitrite NEGATIVE  NEGATIVE   Leukocytes, UA MODERATE (*) NEGATIVE  URINE MICROSCOPIC-ADD ON     Status: None   Collection Time    01/02/14  5:55 AM      Result Value Range   Squamous Epithelial / LPF RARE  RARE   WBC, UA 7-10  <3 WBC/hpf   RBC / HPF TOO NUMEROUS TO COUNT  <3 RBC/hpf   Bacteria, UA RARE  RARE   Urine-Other FEW YEAST        HENT:  Head: Normocephalic.  Eyes: EOM are normal.  Neck: Normal range of motion. Neck supple. No thyromegaly present.  Cardiovascular:  Irreg Irreg, rate  increased Respiratory: Effort normal and breath sounds normal. No respiratory distress.  GI: Soft. Bowel sounds are normal. He exhibits no distension.  Neurological: He is alert.  Mood is flat and somewhat slow to process.  Follows simple commands  Skin: Skin is warm and dry.  4/5 bilateral deltoid, bicep, tricep, grip  4/5 in the right hip flexor knee extensor ankle dorsiflexor plantar flexion  4 minus/5 in the left hip flexor knee extensor ankle dorsiflexor plantar flexor  Sensation difficult to assess secondary to poor concentration and attention.  Assessment/Plan: 1. 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 Physiatrist is providing close team supervision and 24 hour management of active medical problems listed below. Physiatrist and rehab team continue to assess barriers to discharge/monitor patient progress toward functional and medical goals. FIM: FIM - Bathing Bathing Steps Patient Completed: Chest;Right Arm;Left Arm;Abdomen;Front perineal area;Right upper leg;Left upper leg Bathing: 3: Mod-Patient completes 5-7 42f10 parts or 50-74%  FIM - Upper Body Dressing/Undressing Upper body dressing/undressing steps patient completed: Thread/unthread right sleeve of pullover shirt/dresss;Thread/unthread left sleeve of pullover shirt/dress Upper body dressing/undressing: 3: Mod-Patient completed 50-74% of tasks FIM - Lower Body Dressing/Undressing Lower body dressing/undressing steps patient completed: Pull underwear up/down;Pull pants up/down Lower body dressing/undressing: 2: Max-Patient completed 25-49% of tasks  FIM - TMusicianDevices: Grab bar or rail for support Toileting: 1: Total-Patient completed zero steps, helper did all 3  FIM - TRadio producer Devices: Walker;Elevated toilet seat Toilet Transfers: 3-To toilet/BSC: Mod A (lift or lower  assist);3-From toilet/BSC: Mod A (lift or lower assist)  FIM - Control and instrumentation engineer Devices: Walker;Arm rests Bed/Chair Transfer: 0: Activity did not occur  FIM - Locomotion: Wheelchair Distance: 75 Locomotion: Wheelchair: 2: Travels 50 - 149 ft with minimal assistance (Pt.>75%) FIM - Locomotion: Ambulation Locomotion: Ambulation Assistive Devices: Administrator Ambulation/Gait Assistance: 4: Min assist Locomotion: Ambulation: 1: Travels less than 50 ft with minimal assistance (Pt.>75%)  Comprehension Comprehension Mode: Auditory Comprehension: 3-Understands basic 50 - 74% of the time/requires cueing 25 - 50%  of the time  Expression Expression Mode: Verbal Expression: 3-Expresses basic 50 - 74% of the time/requires cueing 25 - 50% of the time. Needs to repeat parts of sentences.  Social Interaction Social Interaction: 2-Interacts appropriately 25 - 49% of time - Needs frequent redirection.  Problem Solving Problem Solving: 3-Solves basic 50 - 74% of the time/requires cueing 25 - 49% of the time  Memory Memory: 2-Recognizes or recalls 25 - 49% of the time/requires cueing 51 - 75% of the time  Medical Problem List and Plan:  Acute left parietal infarct with decreased balance, possible aphasia as well as decreased cognition. Also has chronic left lower extremity weakness from right corona radiata infarct 2013  1. DVT Prophylaxis/Anticoagulation: Pharmaceutical: Xarelto  2. Pain Management: Will add Sportscreme to help with knee stiffness.  3. Mood: LSCW to follow for support.  4. Neuropsych: This patient is not at this time capable of making decisions on his own behalf.  5. A fib:RVR Will continue to monitor HR with bid checks. Continue xarelto, cardizem and increase lanoxin.  6. Hypotension: On Cardizem and lanoxin for HR control. Will monitor for  trends--may need cardizem changed back to 30 mg q 6hours. Need to avoid hypotension. Will increase IVF for hydration and then change to evenings due to dysphagia diet with evidence of dehydration.  7. SIRS due to H Flu and questionable UTI: Has completed antibiotics and Tamiflu as of 12/31/13. Add prn xopenex for  8. Dysphagia: Continue Dysphagia 1 with nectar thick fluids. Push po fluids.  9. Pre-diabetic: Hgb A1c-5.8. Will have RD educate on CM diet.  10. Lethargy:probable multifactorial, possible UTI, recheck culture, 1/6 cx looked contaminated -increase IVF, check am labs  - follow sleep pattern   LOS (Days) 3 A FACE TO FACE EVALUATION WAS PERFORMED  KIRSTEINS,ANDREW E 01/04/2014, 8:17 AM

## 2014-01-04 NOTE — Progress Notes (Signed)
Xarelto per pharmacy  Admit Complaint: 44 yom originally admitted for CVA, hematuria and flu, transferred to CIR on 1/6  AC: 80 yo man on Xarelto 15 mg po daily for Afib per Dr. Mare Ferrari. It was held on admission due to hematuria. Xarelto was ordered to be resumed 1/1 as urine more clear but he failed his swallow evaluation.  Wt 77.7kg, creat 1.01, Hg 13.3, pltc low at 145 [phone]). His creat cl bareley qualifies him for Xarelto 20 mg po q evening but pt on lower dose due to hematuria per MD's request  Plan:  - continue Xarelto 15 mg po q supper  - F/u hematuria; watch CBC w/ MD

## 2014-01-04 NOTE — IPOC Note (Signed)
Overall Plan of Care [facility]) Patient Details Name: [name] MRN: [id number] DOB: [birth year]  Admitting Diagnosis: R CVA  Hospital Problems: Principal Problem:   CVA (cerebral infarction) Active Problems:   NEOPLASM, MALIGNANT, BLADDER   HYPERTENSION   Nocturia   Atrial fibrillation     Functional Problem List: Nursing Bladder;Bowel;Safety;Skin Integrity;Nutrition  PT Balance;Endurance;Motor;Pain;Perception;Safety  OT Balance;Cognition;Endurance;Motor;Pain;Perception;Safety;Sensory;Vision  SLP Cognition;Linguistic  TR         Basic ADL's: OT Eating;Grooming;Dressing;Bathing;Toileting     Advanced  ADL's: OT       Transfers: PT Bed Mobility;Bed to Chair;Car;Furniture  OT Tub/Shower;Toilet     Locomotion: PT Ambulation;Wheelchair Mobility;Stairs     Additional Impairments: OT None  SLP Swallowing;Communication;Social Cognition comprehension;expression Problem Solving;Memory;Attention;Awareness  TR      Anticipated Outcomes Item Anticipated Outcome  Self Feeding supervision  Swallowing  Min with least restrictive PO   Basic self-care  supervision  Toileting  supervision   Bathroom Transfers supervision  Bowel/Bladder  Wears condom cath at night for incontinence- incontinent of bowel  Transfers  Supervision-Mod I  Locomotion  Supervision-Mod I  Communication  Min  Cognition  Min  Pain  < 3  Safety/Judgment  min assist   Therapy Plan: PT Intensity: Minimum of 1-2 x/day ,45 to 90 minutes PT Frequency: 5 out of 7 days PT Duration Estimated Length of Stay: 14-18 days OT Intensity: Minimum of 1-2 x/day, 45 to 90 minutes OT Frequency: 5 out of 7 days OT Duration/Estimated Length of Stay: 14-18 days SLP Intensity: Minumum of 1-2 x/day, 30 to 90 minutes SLP Frequency: 5 out of 7 days SLP Duration/Estimated Length of Stay: 16 days       Team Interventions: Nursing Interventions Patient/Family Education;Bladder Management;Bowel  Management;Medication Management;Skin Care/Wound Management;Dysphagia/Aspiration Precaution Training  PT interventions Ambulation/gait training;Balance/vestibular training;Cognitive remediation/compensation;Disease management/prevention;Discharge planning;DME/adaptive equipment instruction;Functional mobility training;Patient/family education;Pain management;Neuromuscular re-education;UE/LE Coordination activities;Wheelchair propulsion/positioning;UE/LE Strength taining/ROM;Stair training;Therapeutic Activities;Therapeutic Exercise  OT Interventions Balance/vestibular training;Cognitive remediation/compensation;Community reintegration;Discharge planning;DME/adaptive equipment instruction;Functional mobility training;Pain management;Patient/family education;Neuromuscular re-education;Psychosocial support;Self Care/advanced ADL retraining;Therapeutic Activities;Therapeutic Exercise;UE/LE Strength taining/ROM;UE/LE Coordination activities;Visual/perceptual remediation/compensation  SLP Interventions Cognitive remediation/compensation;Cueing hierarchy;Dysphagia/aspiration precaution training;Environmental controls;Functional tasks;Internal/external aids;Patient/family education;Speech/Language facilitation  TR Interventions    SW/CM Interventions Discharge Planning;Psychosocial Support;Patient/Family Education    Team Discharge Planning: Destination: PT-Home ,OT- Home , SLP-Home Projected Follow-up: PT-Home health PT, OT-  Home health OT, SLP-Home Health SLP;24 hour supervision/assistance Projected Equipment Needs: PT-Rolling walker with 5" wheels;To be determined, OT- 3 in 1 bedside comode;Tub/shower bench, SLP-None recommended by SLP Equipment Details: PT- , OT-  Patient/family involved in discharge planning: PT- Patient;Family member/caregiver,  OT-Family member/caregiver;Patient, SLP-Family member/caregiver  MD ELOS: 14-18d Medical Rehab Prognosis:  Good Assessment: 80 y.o. right handed male with  history of COPD, prior CVA 08/2012 with right sided weakness and dysphagia ( CIR stay) , A fib--Xarelto and bladder cancer with ongoing treatment at [facility]. He was admitted 12/25/2013 with altered mental status and low grade fever of 100 Fahrenheit and reported hematuria. Cranial CT scan negative and MRI of the brain showed punctate acute infarct in the high left parietal lobe as well as extensive chronic ischemic changes. Patient's Xarelto held for a short time secondary to hematuria. He was started on IV rocephin due to SIRS as well as Tamiflu as he tested positive for type A influenza  Now requiring 24/7 Rehab RN,MD, as well as CIR level PT, OT and SLP.  Treatment team will focus on ADLs and mobility with goals set at Supervision   See Team Conference Notes for weekly updates  to the plan of care

## 2014-01-04 NOTE — Progress Notes (Signed)
Social Work Assessment and Plan  Patient Details  Name: [name] MRN: [id number] Date of Birth: 12-Jun-1927  Today's Date: 01/04/2014  Problem List:  Patient Active Problem List   Diagnosis Date Noted  . Influenza with other respiratory manifestations 01/01/2014  . Prediabetes 01/01/2014  . CVA (cerebral infarction) 01/01/2014  . Atrial fibrillation 12/25/2013  . Hypothermia 03/23/2013  . Constipation 10/16/2012  . Preventative health care 09/17/2012  . Nocturia 09/17/2012  . UTI (lower urinary tract infection) 08/22/2012  . Leukocytosis 08/22/2012  . Pseudogout 08/22/2012  . Encephalopathy 08/21/2012  . Left knee pain 08/21/2012  . Prerenal azotemia 08/21/2012  . CVA (cerebral vascular accident)-Acute right small nonhemorrhagic infarct seen on MRI 08/17/2012  . Lacunar stroke 04/07/2011  . Pleural effusion 04/07/2011  . 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 Long term current use of anticoagulant 04/07/2011  . NEOPLASM, MALIGNANT, BLADDER 04/10/2009  . HYPERLIPIDEMIA 04/10/2009  . ANEMIA-NOS 04/10/2009  . 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 ANXIETY 04/10/2009  . HYPERTENSION 04/10/2009  . COPD 04/10/2009  . TRANSIENT ISCHEMIC ATTACK, HX OF 04/10/2009   Past Medical History:  Past Medical History  Diagnosis Date  . NEOPLASM, MALIGNANT, BLADDER 04/10/2009  . HYPERLIPIDEMIA 04/10/2009  . ANEMIA-NOS 04/10/2009  . 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 ANXIETY 04/10/2009  . HYPERTENSION 04/10/2009  . Atrial fibrillation 04/10/2009  . COPD 04/10/2009  . TRANSIENT ISCHEMIC ATTACK, HX OF 04/10/2009  . Bladder cancer     Dr. Lawerance Bach  . Lacunar stroke 04/07/2011  . Pleural effusion 04/07/2011  . 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 Stroke    Past Surgical History:  Past Surgical History  Procedure Laterality Date  . Splenectomy    . Bilateral vats ablation    . Facial cancer      facial skin cancer   Social History:  reports that he has never smoked. He has never  used smokeless tobacco. He reports that he does not drink alcohol. His drug history is not on file.  Family / Support Systems Marital Status: Married Patient Roles: Spouse Spouse/Significant Other: Lucas Winograd  [phone] (h) and [phone] (m) Children: Parris Cudworth - son  [phone] (h)  and [phone] (m) Other Supports: extended family and neighbors Anticipated Caregiver: wife Ability/Limitations of Caregiver: none Caregiver Availability: 24/7 Family Dynamics: good support from wife and son  Social History Preferred language: English Religion: Christian Read: Yes Write: Yes Employment Status: Retired Date Retired/Disabled/Unemployed: several years ago Freight forwarder Issues: none Guardian/Conservator: N/A   Abuse/Neglect Physical Abuse: Denies Verbal Abuse: Denies Sexual Abuse: Denies Exploitation of patient/patient's resources: Denies Self-Neglect: Denies  Emotional Status Pt's affect, behavior and adjustment status: Pt was very lethargic and could not hold conversation with CSW at time of initial assessment.  CSW will continue to assess pt. Recent Psychosocial Issues: none per son Pyschiatric History: none per son Substance Abuse History: none per son  Patient / Family Perceptions, Expectations & Goals Pt/Family understanding of illness & functional limitations: Pt's son expressed a good understanding of pt's condition and feels family's questions have been answered. 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 He also enjoys golfing and spending time with family. Anticipated changes in roles/activities/participation: Pt's wife can assist pt and keep the household going. Pt/family expectations/goals: Pt's son reports that pt just wants to get home.  Community [employer] Agencies: None Premorbid Home Care/DME Agencies: Other (Comment) (pt uses [employer] control [employer] for condom  catheters) Transportation available at discharge:  Pt's wife and son Resource referrals recommended: Neuropsychology;Support group (specify)  Discharge Planning Support Systems: Spouse/significant other;Children;Other relatives;Friends/neighbors Type of Residence: Private residence Insurance Resources: [employer] Financial Resources: Social Security Financial Screen Referred: No Living Expenses: Own Money Management: Patient;Spouse Does the patient have any problems obtaining your medications?: No Home Management: Pt's wife will be able to manage the household.   Patient/Family Preliminary Plans: Pt will go home with his wife at d/c.  Pt's wife has been providing pt with 24/7 supervision already, so this will not be a change for her. Barriers to Discharge: Steps Social Work Anticipated Follow Up Needs: HH/OP Expected length of stay: about 2 weeks  Clinical Impression CSW met with pt and his son to complete initial assessment and introduce self to pt on 01-02-14.  Pt was very tired and lethargic during visit, so son answered most of CSW's questions for pt.  Then, CSW met pt's wife 01-03-14, and plan was confirmed for pt's d/c.  Pt's wife will be his caregiver and she was already with pt most of the time, prior to this admission.  Pt would sometimes go to their farm where they have gardens on his own.  This is 1/2 mile from their current home.  Son has encouraged them to move to the farm so that they will be near their gardens.  Pt's wife is comfortable with helping pt with care, as needed.  Son works and lives out of town, but is supportive and can take time off of work, as needed.  Pt has a shower seat and uses condom catheters at night, but no other DME.  Pt was on inpt rehab after previous stroke in 07/2012, so family feels comfortable on unit and understands how things operate.  Pt's wife would like to have a prescription at d/c for the condom catheters as someone at  [facility] told her that pt's insurance may cover part of them.  CSW will make sure that we provide her with this at d/c.  No other concerns/questions noted at this time.  CSW will continue to follow and assist as needed.  Jaculin Rasmus, [name] 01/04/2014, 9:54 AM

## 2014-01-04 NOTE — Progress Notes (Addendum)
Physical Therapy Session Note  Patient Details  Name: [name] MRN: [id number] Date of Birth: 1927-04-17  Today's Date: 01/04/2014 Time: 0903-1000 and 6834-1962 Time Calculation (min): 57 min and 35 min  Short Term Goals: Week 1:  PT Short Term Goal 1 (Week 1): Pt to perform supine<>sit with min A. PT Short Term Goal 2 (Week 1): Pt to perform sit<>stand with min A. PT Short Term Goal 3 (Week 1): Pt to perform bed<>chair with min A. PT Short Term Goal 4 (Week 1): Pt to perform w/c mobility x25' in controlled environment with max A. PT Short Term Goal 5 (Week 1): Pt to perform gait x50' in controlled environment with rolling walker and min guard.  Skilled Therapeutic Interventions/Progress Updates:    Treatment Session 1: Pt received seated in w/c with wife present. Oriented x4 with increased time, use of visual aid to orient self to date. Self-propulsion of w/c x150' in controlled environment with bilat LE's; improved speed and fluidity of LE movement as compared with previous sessions. Pt demonstrates effective between-session carryover of proper management of w/c brakes. Performed gait x100' in controlled environment with rolling walker and min A, manual facilitation of upright posture at anterior R shoulder, posterior pelvis. Negotiation of 6 stairs (6.5") with single L rail, sideways, step-to pattern with min A to ascend, min guard to descend; mod verbal cues for foot placement.   Performed multiple low-pivot transfers from w/c<> mat table (raised) with min A to close supervision; focus on anterior weight shifting. Seated EOM: bilat scooting with manual facilitation of weight shifting with effective return demonstration. Sit<>stand from raised mat with min A, L HHA, verbal cueing for technique. Static standing x20 seconds with min A, L HHA, limited bilat hip extension. Session ended in pt room, where pt was left seated in w/c with quick release belt in place for safety, wife present, and all  needs within reach.  Treatment Session 2: Pt received asleep in bedside chair with wife present. Very difficult to awaken but agreeable to session. No c/o knee pain throughout. Pt with sudden request to urinate; able to utilize urinal while standing with bilat UE support at rolling walker with min guard for stability/balance. Stand pivot from bedside chair<>w/c with rolling walker and min A, verbal cues for setup/technique. Pt's wife educated on proper cueing with sit>stand transfer. Performed w/c mobility x60' in controlled environment using bilat LE's, increased time. Negotiation of 6 stairs  (6.5") with single L rail, pt facing sideways, step-to pattern, and min A to ascend, close supervision to descend. Gait x120' in controlled environment with rolling walker, min guard, min verbal cueing for obstacle negotiation on L side and for upright posture. Session ended in pt room, where pt was left seated in bedside chair with wife present and all needs within reach.  Therapy Documentation Precautions:  Precautions Precautions: Fall Restrictions Weight Bearing Restrictions: No Pain: Pain Assessment Pain Assessment: No/denies pain Pain Score: 0-No pain Locomotion : Ambulation Ambulation/Gait Assistance: 4: Min guard Wheelchair Mobility Distance: 60   See FIM for current functional status  Therapy/Group: Individual Therapy  [name], Malva Cogan 01/04/2014, 3:19 PM

## 2014-01-04 NOTE — Plan of Care (Signed)
Problem: RH Grooming Goal: LTG Patient will perform grooming w/assist,cues/equip (OT) LTG: Patient will perform grooming with assist, with/without cues using equipment (OT)  Downgraded as pt's wife was assisting with combing hair PTA  Problem: RH Bathing Goal: LTG Patient will bathe with assist, cues/equipment (OT) LTG: Patient will bathe specified number of body parts with assist with/without cues using equipment (position) (OT)  Downgraded as pt's wife was assisting with buttocks hygiene PTA and pt and her wish to continue this.

## 2014-01-04 NOTE — Plan of Care (Signed)
Problem: RH BLADDER ELIMINATION Goal: RH STG MANAGE BLADDER WITH EQUIPMENT WITH ASSISTANCE STG Manage Bladder With Equipment With max Assistance  Outcome: Not Progressing Condom cath at [facility]

## 2014-01-04 NOTE — Progress Notes (Signed)
Speech Language Pathology Daily Session Note  Patient Details  Name: [name] MRN: [id number] Date of Birth: November 16, 1927  Today's Date: 01/04/2014 Time: 1205-1250 Time Calculation (min): 45 min  Short Term Goals: Week 1: SLP Short Term Goal 1 (Week 1): Patient will consume Dys 2 solids and nectar thick liquids with supervision for safety. SLP Short Term Goal 1 - Progress (Week 1): Updated due to goal met SLP Short Term Goal 2 (Week 1): Pt will consume therapeutic trials of thin liquid with minimal overt s/s of aspiration to determine readiness for diet advancement SLP Short Term Goal 3 (Week 1): Pt will sustain attention to basic functional task for 30 seconds with Max cues SLP Short Term Goal 4 (Week 1): Pt will use environmental clues to demonstrate orientation x4 with Max cues SLP Short Term Goal 5 (Week 1): Pt will follow one-step commands during functional task with Max cues SLP Short Term Goal 6 (Week 1): Pt will verbally communicate wants/needs during functional task with Max cues  Skilled Therapeutic Interventions: Skilled treatment focused on assessing his toleration of dysphagia 1 (puree), nectar liquids lunch meal, as well as trial of upgraded solid textures. Patient attended well and remained alert during entire meal. After clinician provided setup of tray, patient self-managed intake of solids and liquids. Mastication and swallow initiation with trial of mechanical soft solids were both adequate with no overt s/s aspiration or difficulty. Patient exhibited a mild wet vocal quality prior to and during meal. patient's spouse present during session and continues to demostrate understanding and adherance to swallow safety and precautions. SLP to advance patient's solid textures to Dysphagia 2 (chopped). Recommend continuing to trial patient with advanced solids and liquids as his mentition and alertness levels continue to improve.    FIM:  Comprehension Comprehension Mode:  Auditory Comprehension: 3-Understands basic 50 - 74% of the time/requires cueing 25 - 50%  of the time Expression Expression Mode: Verbal Expression: 3-Expresses basic 50 - 74% of the time/requires cueing 25 - 50% of the time. Needs to repeat parts of sentences. Social Interaction Social Interaction: 2-Interacts appropriately 25 - 49% of time - Needs frequent redirection. Problem Solving Problem Solving: 3-Solves basic 50 - 74% of the time/requires cueing 25 - 49% of the time Memory Memory: 2-Recognizes or recalls 25 - 49% of the time/requires cueing 51 - 75% of the time FIM - Eating Eating Activity: 5: Supervision/cues  Pain Pain Assessment Pain Assessment: No/denies pain Pain Score: 0-No pain Pain Type: Acute pain Pain Location: Knee Pain Orientation: Right;Left Pain Descriptors / Indicators: Aching Pain Onset: On-going Pain Intervention(s): Other (Comment);Rest (Per pt, RN aware, already administered medication) Multiple Pain Sites: No  Therapy/Group: Individual Therapy  Dannial Monarch 01/04/2014, 1:05 PM  Sonia Baller, MA, CCC-SLP [facility] Speech-Language Pathologist

## 2014-01-05 ENCOUNTER — Inpatient Hospital Stay (HOSPITAL_COMMUNITY): Payer: Medicare Other | Admitting: Speech Pathology

## 2014-01-05 ENCOUNTER — Inpatient Hospital Stay (HOSPITAL_COMMUNITY): Payer: Medicare Other

## 2014-01-05 ENCOUNTER — Inpatient Hospital Stay (HOSPITAL_COMMUNITY)
Admission: AD | Admit: 2014-01-05 | Discharge: 2014-01-09 | DRG: 065 | Disposition: A | Discharge status: Rehabilitation Facility | Payer: Medicare Other | Source: Ambulatory Visit | Attending: Internal Medicine | Admitting: Internal Medicine

## 2014-01-05 ENCOUNTER — Encounter (HOSPITAL_COMMUNITY): Payer: Self-pay | Admitting: Emergency Medicine

## 2014-01-05 ENCOUNTER — Inpatient Hospital Stay (HOSPITAL_COMMUNITY): Payer: Medicare Other | Admitting: *Deleted

## 2014-01-05 DIAGNOSIS — D693 Immune thrombocytopenic purpura: Secondary | ICD-10-CM | POA: Diagnosis present

## 2014-01-05 DIAGNOSIS — I4891 Unspecified atrial fibrillation: Secondary | ICD-10-CM

## 2014-01-05 DIAGNOSIS — I69991 Dysphagia following unspecified cerebrovascular disease: Secondary | ICD-10-CM

## 2014-01-05 DIAGNOSIS — F411 Generalized anxiety disorder: Secondary | ICD-10-CM | POA: Diagnosis present

## 2014-01-05 DIAGNOSIS — R4182 Altered mental status, unspecified: Secondary | ICD-10-CM | POA: Diagnosis present

## 2014-01-05 DIAGNOSIS — I639 Cerebral infarction, unspecified: Secondary | ICD-10-CM | POA: Diagnosis present

## 2014-01-05 DIAGNOSIS — H53469 Homonymous bilateral field defects, unspecified side: Secondary | ICD-10-CM | POA: Diagnosis present

## 2014-01-05 DIAGNOSIS — Z8249 Family history of ischemic heart disease and other diseases of the circulatory system: Secondary | ICD-10-CM

## 2014-01-05 DIAGNOSIS — Z8551 Personal history of malignant neoplasm of bladder: Secondary | ICD-10-CM

## 2014-01-05 DIAGNOSIS — Z9089 Acquired absence of other organs: Secondary | ICD-10-CM

## 2014-01-05 DIAGNOSIS — G8190 Hemiplegia, unspecified affecting unspecified side: Secondary | ICD-10-CM | POA: Diagnosis present

## 2014-01-05 DIAGNOSIS — B958 Unspecified staphylococcus as the cause of diseases classified elsewhere: Secondary | ICD-10-CM | POA: Diagnosis present

## 2014-01-05 DIAGNOSIS — I6350 Cerebral infarction due to unspecified occlusion or stenosis of unspecified cerebral artery: Secondary | ICD-10-CM

## 2014-01-05 DIAGNOSIS — D649 Anemia, unspecified: Secondary | ICD-10-CM

## 2014-01-05 DIAGNOSIS — N390 Urinary tract infection, site not specified: Secondary | ICD-10-CM

## 2014-01-05 DIAGNOSIS — I10 Essential (primary) hypertension: Secondary | ICD-10-CM | POA: Diagnosis present

## 2014-01-05 DIAGNOSIS — E876 Hypokalemia: Secondary | ICD-10-CM | POA: Diagnosis present

## 2014-01-05 DIAGNOSIS — Z7901 Long term (current) use of anticoagulants: Secondary | ICD-10-CM

## 2014-01-05 DIAGNOSIS — E785 Hyperlipidemia, unspecified: Secondary | ICD-10-CM | POA: Diagnosis present

## 2014-01-05 DIAGNOSIS — K5900 Constipation, unspecified: Secondary | ICD-10-CM

## 2014-01-05 DIAGNOSIS — R4701 Aphasia: Secondary | ICD-10-CM | POA: Diagnosis present

## 2014-01-05 DIAGNOSIS — G9340 Encephalopathy, unspecified: Secondary | ICD-10-CM

## 2014-01-05 DIAGNOSIS — Z85828 Personal history of other malignant neoplasm of skin: Secondary | ICD-10-CM

## 2014-01-05 DIAGNOSIS — N12 Tubulo-interstitial nephritis, not specified as acute or chronic: Secondary | ICD-10-CM | POA: Diagnosis present

## 2014-01-05 LAB — CBC WITH DIFFERENTIAL/PLATELET
BASOS ABS: 0 10*3/uL (ref 0.0–0.1)
Basophils Relative: 0 % (ref 0–1)
EOS PCT: 0 % (ref 0–5)
Eosinophils Absolute: 0 10*3/uL (ref 0.0–0.7)
HEMATOCRIT: 32.3 % — AB (ref 39.0–52.0)
Hemoglobin: 11.1 g/dL — ABNORMAL LOW (ref 13.0–17.0)
LYMPHS ABS: 0.6 10*3/uL — AB (ref 0.7–4.0)
LYMPHS PCT: 6 % — AB (ref 12–46)
MCH: 33.1 pg (ref 26.0–34.0)
MCHC: 34.4 g/dL (ref 30.0–36.0)
MCV: 96.4 fL (ref 78.0–100.0)
Monocytes Absolute: 0.8 10*3/uL (ref 0.1–1.0)
Monocytes Relative: 9 % (ref 3–12)
NEUTROS ABS: 7.4 10*3/uL (ref 1.7–7.7)
Neutrophils Relative %: 84 % — ABNORMAL HIGH (ref 43–77)
Platelets: 300 10*3/uL (ref 150–400)
RBC: 3.35 MIL/uL — AB (ref 4.22–5.81)
RDW: 14.3 % (ref 11.5–15.5)
WBC: 8.8 10*3/uL (ref 4.0–10.5)

## 2014-01-05 LAB — BASIC METABOLIC PANEL
BUN: 24 mg/dL — ABNORMAL HIGH (ref 6–23)
CHLORIDE: 100 meq/L (ref 96–112)
CO2: 27 meq/L (ref 19–32)
Calcium: 9 mg/dL (ref 8.4–10.5)
Creatinine, Ser: 0.82 mg/dL (ref 0.50–1.35)
GFR calc Af Amer: >90 mL/min (ref >90)
GFR calc non Af Amer: 79 mL/min — ABNORMAL LOW (ref >90)
GLUCOSE: 101 mg/dL — AB (ref 70–99)
Potassium: 4.6 mEq/L (ref 3.7–5.3)
Sodium: 138 mEq/L (ref 137–147)

## 2014-01-05 LAB — GLUCOSE, CAPILLARY: Glucose-Capillary: 91 mg/dL (ref 70–99)

## 2014-01-05 LAB — TROPONIN I: Troponin I: <0.3 ng/mL (ref <0.30)

## 2014-01-05 MED ORDER — SODIUM CHLORIDE 0.9 % IV SOLN
INTRAVENOUS | Status: DC
  Administered 2014-01-05: 16:00:00 via INTRAVENOUS

## 2014-01-05 MED ORDER — LORAZEPAM 2 MG/ML IJ SOLN
INTRAMUSCULAR | Status: AC
  Administered 2014-01-05: 12:00:00 0.5 mg via INTRAVENOUS
  Filled 2014-01-05: qty 1

## 2014-01-05 MED ORDER — DIGOXIN 125 MCG PO TABS
0.1250 mg | ORAL_TABLET | Freq: Every day | ORAL | Status: DC
  Filled 2014-01-05 (×2): qty 1

## 2014-01-05 MED ORDER — ONDANSETRON HCL 4 MG PO TABS: 4.0000 mg | ORAL_TABLET | Freq: Four times a day (QID) | ORAL | Status: DC | PRN

## 2014-01-05 MED ORDER — DILTIAZEM HCL 100 MG IV SOLR
5.0000 mg/h | INTRAVENOUS | Status: AC
  Administered 2014-01-05 – 2014-01-07 (×3): 5 mg/h via INTRAVENOUS
  Filled 2014-01-05 (×4): qty 100

## 2014-01-05 MED ORDER — DEXTROSE 5 % IV SOLN
1.0000 g | INTRAVENOUS | Status: DC
  Filled 2014-01-05: qty 10

## 2014-01-05 MED ORDER — SODIUM CHLORIDE 0.9 % IV SOLN
INTRAVENOUS | Status: DC
  Administered 2014-01-05: 100 mL/h via INTRAVENOUS

## 2014-01-05 MED ORDER — LORAZEPAM 2 MG/ML IJ SOLN
0.5000 mg | Freq: Once | INTRAMUSCULAR | Status: AC
  Administered 2014-01-05: 0.5 mg via INTRAVENOUS

## 2014-01-05 MED ORDER — ONDANSETRON HCL 4 MG/2ML IJ SOLN: 4.0000 mg | Freq: Four times a day (QID) | INTRAMUSCULAR | Status: DC | PRN

## 2014-01-05 MED ORDER — SENNOSIDES-DOCUSATE SODIUM 8.6-50 MG PO TABS: 1.0000 | ORAL_TABLET | Freq: Every day | ORAL | Status: DC

## 2014-01-05 MED ORDER — RESOURCE THICKENUP CLEAR PO POWD: 1.0000 | ORAL | Status: DC | PRN

## 2014-01-05 MED ORDER — DEXTROSE 5 % IV SOLN
5.0000 mg/h | INTRAVENOUS | Status: DC
  Filled 2014-01-05: qty 100

## 2014-01-05 MED ORDER — DEXTROSE 5 % IV SOLN
1.0000 g | INTRAVENOUS | Status: DC
  Administered 2014-01-05 – 2014-01-08 (×4): 1 g via INTRAVENOUS
  Filled 2014-01-05 (×5): qty 10

## 2014-01-05 MED ORDER — SODIUM CHLORIDE 0.9 % IJ SOLN
3.0000 mL | Freq: Two times a day (BID) | INTRAMUSCULAR | Status: DC
  Administered 2014-01-06 – 2014-01-09 (×4): 3 mL via INTRAVENOUS

## 2014-01-05 MED ORDER — RIVAROXABAN 15 MG PO TABS
15.0000 mg | ORAL_TABLET | Freq: Every day | ORAL | Status: DC
  Filled 2014-01-05: qty 1

## 2014-01-05 NOTE — Progress Notes (Signed)
SLP Cancellation Note  Patient Details Name: Nicholaos Schippers MRN: [id number] DOB: [birth year]   Cancelled treatment:       Pt missed 30 minutes of skilled SLP intervention due to change in medical status and transfer to telemetry unit.   Valda Christenson 01/05/2014, 3:27 PM

## 2014-01-05 NOTE — Significant Event (Signed)
Rapid Response Event Note  Overview: Time Called: 1118 Arrival Time: 1119 Event Type: Neurologic  Initial Focused Assessment: Per staff, while assisting patient with shower he became dysarthric and had Right sided weakness.  LKW prior to shower at 1100am.  Wife and Son at bedside Upon arrival patient lying flat in bed.  NIHSS 4  Left gaze preference, able to cross midline, Right arm drift, mild aphasia, and mild dysarthria.  Transported patient to radiology for head CT.  Patient with continuous movement of right arm questionable for seizure activity.  0.5 mg Ativan IVP.  EEG done at bedside.  Per Dr Nicole Kindred no seizure activity noted.  Pluse 170s, placed patient on Zoll monitor.  RAF 130-160.  5mg  Lopressor given IV, HR decreased initially into 90s with bursts of AF to 120, then HR remained 120-130.  DR K notified of HR and BP and patient status by Dr Nicole Kindred and myself.  Patient now NPO post Code Stroke since previously on a dysphasia 1 diet.  Dr Raliegh Ip consulted [facility] for hospital admit.  Dr Karleen Hampshire admitting patient, order received for Cardizem gtt.  Bolus and gtt given immediately post transfer to 4E.  Post 20mg  bolus HR 90-1110s, gtt started at 5 mg per hour.  Interventions:   Event Summary: Name of Physician Notified: Dr Nicole Kindred, and Dr Raliegh Ip at 1119  Name of Consulting Physician Notified: Dr Karleen Hampshire at 1230  Outcome: Transferred (Comment) [phone])  Event End Time: [place]  Raliegh Ip

## 2014-01-05 NOTE — Progress Notes (Signed)
Occupational Therapy Note  Patient Details  Name: [name] MRN: [id number] Date of Birth: 03-06-1927 Today's Date: 01/05/2014  Time: 1000-1100  (60 min) Pain:  Bilateral shoulder and knee pain  6 /10  Pt not requesting medicines at this time.  Knee is medicated with -pain cream Individual session  Pt sitting in recliner upon OT arrival.  Addressed functional mobility, transfers to toilet and shower.  Pt. Ambulated with RW to toilet with minimal assist.  Transferred to toilet and doffed pants for toileting.  Pt ambulated to shower and transferred to tub bench with minimal assist.  Pt. Able to wash with mod assist.  Went from sit to stand for peri care with minimal assist x2.  Completed dressing on shower bench.  Pt went from sit to stand and suddenly had garbled speech.  Son and Wife present.  Right hand was fisting.  Transferred pt to wc and back to bed.  Nurse notified.  Code stroke called.  See nursing note.       Lisa Roca 01/05/2014, 10:18 AM

## 2014-01-05 NOTE — Progress Notes (Signed)
EEG completed; results pending.    

## 2014-01-05 NOTE — Progress Notes (Signed)
Priscilla Finklea is a 80 y.o. male [birth year] [id number]  Subjective: C/o constipation. He had some cough and wheezing prior - not this am. Slept well. Feeling OK.  Objective: Vital signs in last 24 hours: Temp:  [98.1 F (36.7 C)-98.4 F (36.9 C)] 98.1 F (36.7 C) (01/10 0640) Pulse Rate:  [97-100] 97 (01/10 0640) Resp:  [17-18] 18 (01/10 0640) BP: (113-120)/(76-88) 113/76 mmHg (01/10 0640) SpO2:  [95 %-96 %] 96 % (01/10 0640) Weight change:  Last BM Date: 01/03/14  Intake/Output from previous day: 01/09 0701 - 01/10 0700 In: 70 [P.O.:720] Out: -  Last cbgs: CBG (last 3)  No results found for this basename: GLUCAP,  in the last 72 hours   Physical Exam General: No apparent distress    HEENT: moist mucosa Lungs: Normal effort. Lungs clear to auscultation, no crackles or wheezes. Cardiovascular: Iregular rate and rhythm, no edema Abdomen: S/NT/ND; BS(+) Musculoskeletal:  No change from before Neurological: No new neurological deficits Wounds: N/A    Skin: clear Alert, cooperative Foley is in - dark urine   Lab Results: BMET    Component Value Date/Time   NA 139 01/02/2014 0528   K 4.1 01/02/2014 0528   CL 102 01/02/2014 0528   CO2 27 01/02/2014 0528   GLUCOSE 110* 01/02/2014 0528   BUN 23 01/02/2014 0528   CREATININE 0.90 01/02/2014 0528   CALCIUM 8.5 01/02/2014 0528   GFRNONAA 76* 01/02/2014 0528   GFRAA 88* 01/02/2014 0528   CBC    Component Value Date/Time   WBC 5.8 01/02/2014 0528   RBC 3.45* 01/02/2014 0528   HGB 11.1* 01/02/2014 0528   HCT 33.3* 01/02/2014 0528   PLT 195 01/02/2014 0528   MCV 96.5 01/02/2014 0528   MCH 32.2 01/02/2014 0528   MCHC 33.3 01/02/2014 0528   RDW 14.2 01/02/2014 0528   LYMPHSABS 0.7 01/02/2014 0528   MONOABS 1.4* 01/02/2014 0528   EOSABS 0.0 01/02/2014 0528   BASOSABS 0.0 01/02/2014 0528    Studies/Results: No results found.  Medications: I have reviewed the patient's current medications.  Assessment/Plan:   Acute left parietal infarct with decreased  balance, possible aphasia as well as decreased cognition. Also has chronic left lower extremity weakness from right corona radiata infarct 2013  1. DVT Prophylaxis/Anticoagulation: Pharmaceutical: Xarelto  2. Pain Management: Will add Sportscreme to help with knee stiffness.  3. Mood: LSCW to follow for support.  4. Neuropsych: This patient is not at this time capable of making decisions on his own behalf.  5. A fib:RVR Will continue to monitor HR with bid checks. Continue xarelto, cardizem and increase lanoxin.  6. Hypotension: On Cardizem and lanoxin for HR control. Will monitor for trends--may need cardizem changed back to 30 mg q 6hours. Need to avoid hypotension. Will increase IVF for hydration and then change to evenings due to dysphagia diet with evidence of dehydration.  7. SIRS due to H Flu and questionable UTI: Has completed antibiotics and Tamiflu as of 12/31/13. Add prn xopenex for  8. Dysphagia: Continue Dysphagia 1 with nectar thick fluids. Push po fluids.  9. Pre-diabetic: Hgb A1c-5.8. Will have RD educate on CM diet.  10. Lethargy:probable multifactorial, possible UTI, recheck culture, 1/6 cx looked contaminated  -increase IVF, check am labs  - follow sleep pattern 11. Constipation - see Rx 12. Recent wheezing - better    Length of stay, days: 4  Walker Kehr , MD 01/05/2014, 9:30 AM

## 2014-01-05 NOTE — H&P (Signed)
[facility] History and Physical  [name] MOQ:[id number] DOB: January 06, 1927 DOA: 01/05/2014  Referring physician: Dr Shanon Rosser PCP: Cathlean Cower, MD   Chief Complaint: AMS this afternoon.   HPI: [name] is a 80 y.o. male recently diagnosed with CVA, h/o atrial fibrillation on xarelto, hypertension, COPD, in inpatient rehab, was found to be altered after his bath ealier this afternoon. Most of the history was obtained from the patient's family at bedside. He was unable to move right arm, unable to walk and  became confused. Code stroke was called and he was seenby Dr Nicole Kindred. Recommended CT head, whichwas negative for new stroke. MRI brain was ordered and he was referred to hospitalist service for admission. He was also found to be in afib with RVR, . His son reports he had pain and discomfort on urination. He was started o nIV cardizem and ordered IV rocephin for possible UTI. He will be admitted to hospitalist service on telemetry for management of afib with RVR and evaluation of new CVA.    Review of Systems:  Could not be obtained.   Past Medical History  Diagnosis Date  . NEOPLASM, MALIGNANT, BLADDER 04/10/2009  . HYPERLIPIDEMIA 04/10/2009  . ANEMIA-NOS 04/10/2009  . 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 ANXIETY 04/10/2009  . HYPERTENSION 04/10/2009  . Atrial fibrillation 04/10/2009  . COPD 04/10/2009  . TRANSIENT ISCHEMIC ATTACK, HX OF 04/10/2009  . Bladder cancer     Dr. Lawerance Bach  . Lacunar stroke 04/07/2011  . Pleural effusion 04/07/2011  . 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 Stroke    Past Surgical History  Procedure Laterality Date  . Splenectomy    . Bilateral vats ablation    . Facial cancer      facial skin cancer   Social History:  reports that he has never smoked. He has never used smokeless tobacco. He reports that he does not drink alcohol. His drug history is not on file.  Allergies  Allergen Reactions  . Diazepam     REACTION: agitation  .  Ezetimibe-Simvastatin     unknown  . Morphine Other (See Comments)    Hallucinations.  . Sulfonamide Derivatives Hives    Family History  Problem Relation Age of Onset  . Heart disease Mother   . Arthritis Father      Prior to Admission medications   Medication Sig Start Date End Date Taking? Authorizing Provider  Cholecalciferol (VITAMIN D) 2000 UNITS tablet Take 2,000-3,000 Units by mouth daily. Patient takes 2000 units daily, except for taking 3000 units (1.5 tablets) on "winter days" and days when "not feeling well."   Yes Historical Provider, MD  digoxin (LANOXIN) 0.125 MG tablet Take 1 tablet (0.125 mg total) by mouth daily. 04/24/13  Yes Darlin Coco, MD  diltiazem (CARDIZEM CD) 300 MG 24 hr capsule Take 1 capsule (300 mg total) by mouth daily. 01/01/14  Yes Nishant Dhungel, MD  Maltodextrin-Xanthan Gum (RESOURCE THICKENUP CLEAR) POWD Take 120 g by mouth as needed. 01/01/14  Yes Nishant Dhungel, MD  Probiotic Product (PROBIOTIC DAILY PO) Take 1 tablet by mouth every morning.    Yes Historical Provider, MD  Rivaroxaban (XARELTO) 15 MG TABS tablet Take 1 tablet (15 mg total) by mouth daily. 06/05/13  Yes Darlin Coco, MD   Physical Exam: There were no vitals filed for this visit.  There were no vitals taken for this visit.  General: CONFUSED, slightly agitated.  Eyes: PERRL, normal lids, irises & conjunctiva ENT: grossly normal hearing, lips & tongue  Neck: no LAD, masses or thyromegaly Cardiovascular: irregular, RR, no m/r/g. No LE edema. Respiratory: CTA bilaterally, no w/r/r. Normal respiratory effort. Abdomen: soft, mildly tender in the lower quadrant of the abdomen. BS+ NO distention.  Skin: no rash or induration seen on limited exam Musculoskeletal: grossly normal tone BUE/BLE Neurologic: no facial droop, able to move all extremities.           Labs on Admission:  Basic Metabolic Panel:  Recent Labs Lab 12/30/13 0519 01/02/14 0528 01/05/14 1339  NA 138 139  138  K 3.8 4.1 4.6  CL 99 102 100  CO2 30 27 27   GLUCOSE 95 110* 101*  BUN 34* 23 24*  CREATININE 0.94 0.90 0.82  CALCIUM 8.9 8.5 9.0   Liver Function Tests:  Recent Labs Lab 01/02/14 0528  AST 21  ALT 14  ALKPHOS 75  BILITOT 0.5  PROT 6.3  ALBUMIN 2.5*   No results found for this basename: LIPASE, AMYLASE,  in the last 168 hours No results found for this basename: AMMONIA,  in the last 168 hours CBC:  Recent Labs Lab 12/30/13 0519 01/02/14 0528 01/05/14 1339  WBC 9.0 5.8 8.8  NEUTROABS  --  3.8 7.4  HGB 12.1* 11.1* 11.1*  HCT 35.4* 33.3* 32.3*  MCV 97.0 96.5 96.4  PLT 128* 195 300   Cardiac Enzymes: No results found for this basename: CKTOTAL, CKMB, CKMBINDEX, TROPONINI,  in the last 168 hours  BNP (last 3 results) No results found for this basename: PROBNP,  in the last 8760 hours CBG: No results found for this basename: GLUCAP,  in the last 168 hours  Radiological Exams on Admission: Ct Head Wo Contrast  01/05/2014   CLINICAL DATA:  80 year old male with right upper extremity weakness and aphasia. Code stroke.  EXAM: CT HEAD WITHOUT CONTRAST  TECHNIQUE: Contiguous axial images were obtained from the base of the skull through the vertex without intravenous contrast.  COMPARISON:  12/27/2013 CT and MRI and prior examinations.  FINDINGS: Moderate chronic small-vessel white matter ischemic changes, generalized cerebral volume loss and remote lacunar infarcts within the deep white matter, basal ganglia and thalami again noted.  No acute intracranial abnormalities are identified, including mass lesion or mass effect, hydrocephalus, extra-axial fluid collection, midline shift, hemorrhage, or acute infarction. The visualized bony calvarium is unremarkable.  IMPRESSION: No evidence of acute intracranial abnormality.  Atrophy, chronic small-vessel white matter ischemic changes and remote infarcts.  Critical Value/emergent results were called by telephone at the time of  interpretation on 01/05/2014 at 12:08 PM to Dr. Wallie Char , who verbally acknowledged these results.   Electronically Signed   By: Hassan Rowan M.D.   On: 01/05/2014 12:08    EKG: pending.   Assessment/Plan Active Problems:   Altered mental status UTI Hypertension   Altered mental status;  Possibly from infection. But will rule out new CVA. MRI brain ordered and pending. Neuro checks on board.  Neurology on board.   Atrial fibrillation with RVR: Admitted to telemetry and started on IV cardizem,.  Resume digoxin resume xarelto.  Serial troponins,.   UTI: Started on rocephin and urine cultures pending.   Dysphagia: Currently NPO, will get SLP eval.   DVT prophylaxis    Code Status: full code Family Communication: discussed the  plan of care with pt's wife and son Disposition Plan: admit to telemetry.   Time spent: 75 minutes.   [facility] Pager [phone]

## 2014-01-05 NOTE — Progress Notes (Signed)
Pt alert, no sings of pain or discomfort at this time, VSS, no verbal at this time, no distress noticed. Pt continues with IV fluids as ordered and Cardizem gtt at 67mL/hr with HR from 95 - 107. Wife and Son very concern about pt having on hold his Xarelto. Stroke education started with family member, and oriented about the need to keep NPO the pt until pt able to swallow to prevent any aspiration or complication. Pt turned q2H for skin break down prevention, pt on bed alarm for fall precaution. We'll continue with POC.

## 2014-01-05 NOTE — Progress Notes (Addendum)
Patient was finishing shower with therapy when he became very weak more on right   his words were very slurred could not understand any of what he said. Pulse was going up bp stable  99% on rm air. Code stroke was called  Dr  Odis Luster attended the code and  Rapid response nurse . Dr Burnice Logan  Notified .  Taken down for ct scan.

## 2014-01-05 NOTE — Consult Note (Signed)
Reason for Consult: Altered mental status with reduced movement of right extremities.  HPI:                                                                                                                                          [name] is an 80 y.o. male a history of atrial fibrillation on Xarelto, hypertension, hyperlipidemia, COPD and recent small left parietal acute stroke, currently an inpatient on the rehabilitation service, who developed altered mental status with decreased speech as well as reduced movement of right extremities earlier this morning. Code stroke was activated. NIH stroke score was 3. CT scan of his head showed no acute intracranial abnormality. Patient was very slow to respond but was oriented to his correct age as well as correct month. He was able to follow commands but was very slow in initiating movements. He tended to gaze to the left side. He also had repetitive movements of his right hand with extension of his right arm. There was a slight drift of his right upper extremity but no weakness of his right lower extremity. He was given 0.5 mg of Ativan with no change in mental status. An EEG was obtained to rule out focal seizure activity. Study showed slight generalized slowing but was otherwise unremarkable. Urinalysis on 01/04/2014 which showed cloudy urine with evidence of blood and few bacteria. Urine culture is pending. Patient was afebrile this morning.  Past Medical History  Diagnosis Date  . NEOPLASM, MALIGNANT, BLADDER 04/10/2009  . HYPERLIPIDEMIA 04/10/2009  . ANEMIA-NOS 04/10/2009  . 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 ANXIETY 04/10/2009  . HYPERTENSION 04/10/2009  . Atrial fibrillation 04/10/2009  . COPD 04/10/2009  . TRANSIENT ISCHEMIC ATTACK, HX OF 04/10/2009  . Bladder cancer     Dr. Lawerance Bach  . Lacunar stroke 04/07/2011  . Pleural effusion 04/07/2011  . 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 Stroke     Past Surgical History  Procedure Laterality Date   . Splenectomy    . Bilateral vats ablation    . Facial cancer      facial skin cancer    Family History  Problem Relation Age of Onset  . Heart disease Mother   . Arthritis Father     Social History:  reports that he has never smoked. He has never used smokeless tobacco. He reports that he does not drink alcohol. His drug history is not on file.  Allergies  Allergen Reactions  . Diazepam     REACTION: agitation  . Ezetimibe-Simvastatin     unknown  . Morphine Other (See Comments)    Hallucinations.  . Sulfonamide Derivatives Hives    MEDICATIONS:  I have reviewed the patient's current medications.   ROS:                                                                                                                                       History obtained from spouse and child  General ROS: negative for - chills, fatigue, fever, night sweats, weight gain or weight loss Psychological ROS: negative for - behavioral disorder, hallucinations, memory difficulties, mood swings or suicidal ideation Ophthalmic ROS: negative for - blurry vision, double vision, eye pain or loss of vision ENT ROS: negative for - epistaxis, nasal discharge, oral lesions, sore throat, tinnitus or vertigo Allergy and Immunology ROS: negative for - hives or itchy/watery eyes Hematological and Lymphatic ROS: negative for - bleeding problems, bruising or swollen lymph nodes Endocrine ROS: negative for - galactorrhea, hair pattern changes, polydipsia/polyuria or temperature intolerance Respiratory ROS: negative for - cough, hemoptysis, shortness of breath or wheezing Cardiovascular ROS: negative for - chest pain, dyspnea on exertion, edema or irregular heartbeat Gastrointestinal ROS: negative for - abdominal pain, diarrhea, hematemesis, nausea/vomiting or stool incontinence Genito-Urinary ROS:  negative for - dysuria, hematuria, incontinence or urinary frequency/urgency Musculoskeletal ROS: negative for - joint swelling or muscular weakness Neurological ROS: as noted in HPI Dermatological ROS: negative for rash and skin lesion changes   Blood pressure 125/45, pulse 168, temperature 98.1 F (36.7 C), temperature source Oral, resp. rate 18, weight 59.7 kg (131 lb 9.8 oz), SpO2 97.00%.   Neurologic Examination:                                                                                                      Mental Status: Alert, oriented, slow to respond with very low volume speech which was mildly dysarthric. Able to follow commands but very slow to initiate movements. Cranial Nerves: II-Visual fields were normal. III/IV/VI-Pupils were equal and reacted. Extraocular movements were full and conjugate, but gaze preference toward the left side.    V/VII-no facial numbness and no facial weakness. VIII-normal. X-low-volume speech with mild dysarthria. Motor: 5/5 bilaterally with normal tone and bulk except for slight drift of right upper extremity. Sensory: Normal throughout. Deep Tendon Reflexes: 1+ and symmetric. Plantars: Mute bilaterally   Lab Results  Component Value Date/Time   CHOL 107 12/29/2013  5:21 AM    Results for orders placed during the hospital encounter of 01/01/14 (from the past 48 hour(s))  URINALYSIS, ROUTINE W REFLEX MICROSCOPIC     Status: Abnormal   Collection  Time    01/04/14 10:14 AM      Result Value Range   Color, Urine AMBER (*) YELLOW   Comment: BIOCHEMICALS MAY BE AFFECTED BY COLOR   APPearance TURBID (*) CLEAR   Specific Gravity, Urine 1.023  1.005 - 1.030   pH 6.0  5.0 - 8.0   Glucose, UA NEGATIVE  NEGATIVE mg/dL   Hgb urine dipstick LARGE (*) NEGATIVE   Bilirubin Urine SMALL (*) NEGATIVE   Ketones, ur 15 (*) NEGATIVE mg/dL   Protein, ur 100 (*) NEGATIVE mg/dL   Urobilinogen, UA 1.0  0.0 - 1.0 mg/dL   Nitrite NEGATIVE  NEGATIVE    Leukocytes, UA SMALL (*) NEGATIVE  URINE MICROSCOPIC-ADD ON     Status: Abnormal   Collection Time    01/04/14 10:14 AM      Result Value Range   Squamous Epithelial / LPF RARE  RARE   WBC, UA 21-50  <3 WBC/hpf   RBC / HPF TOO NUMEROUS TO COUNT  <3 RBC/hpf   Bacteria, UA FEW (*) RARE    Ct Head Wo Contrast  01/05/2014   CLINICAL DATA:  80 year old male with right upper extremity weakness and aphasia. Code stroke.  EXAM: CT HEAD WITHOUT CONTRAST  TECHNIQUE: Contiguous axial images were obtained from the base of the skull through the vertex without intravenous contrast.  COMPARISON:  12/27/2013 CT and MRI and prior examinations.  FINDINGS: Moderate chronic small-vessel white matter ischemic changes, generalized cerebral volume loss and remote lacunar infarcts within the deep white matter, basal ganglia and thalami again noted.  No acute intracranial abnormalities are identified, including mass lesion or mass effect, hydrocephalus, extra-axial fluid collection, midline shift, hemorrhage, or acute infarction. The visualized bony calvarium is unremarkable.  IMPRESSION: No evidence of acute intracranial abnormality.  Atrophy, chronic small-vessel white matter ischemic changes and remote infarcts.  Critical Value/emergent results were called by telephone at the time of interpretation on 01/05/2014 at 12:08 PM to Dr. Wallie Char , who verbally acknowledged these results.   Electronically Signed   By: Hassan Rowan M.D.   On: 01/05/2014 12:08   Assessment/Plan: Altered mental status of unclear etiology. Focal seizure cannot be ruled out, although unlikely with EEG showing no signs of seizure activity, and no frank unequivocal clinical seizure activity noted. Urine cultures pending. Recurrent urinary tract infection cannot be ruled out. As well recurrent stroke cannot be ruled out, although likelihood is low.  Plan: 1. Will speak with patient's attending physician regarding initiation of antibiotic therapy for  possible urinary tract infection. 2. No anticonvulsant medication recommended at this point. 3. We'll obtain a limited MRI of the brain without contrast to rule out recurrent stroke. 4. Continue Xarelto for anticoagulation.  We will continue to follow this patient with you.   C.R. Nicole Kindred, MD [facility] [phone]  01/05/2014, 1:01 PM

## 2014-01-05 NOTE — Progress Notes (Signed)
Pt arrived to 4E08 family at the bedside. Started cardizem gtt and antibotics. Pt alert but unable to follow commands. HR 130-140 in afib. Explained to family plan of care.

## 2014-01-05 NOTE — Progress Notes (Addendum)
Physical Therapy Session Note  Patient Details  Name: [name] MRN: [id number] Date of Birth: 1927/07/08  Today's Date: 01/05/2014 Time: 0901-1001 Time Calculation (min): 60 min  Short Term Goals: Week 1:  PT Short Term Goal 1 (Week 1): Pt to perform supine<>sit with min A. PT Short Term Goal 2 (Week 1): Pt to perform sit<>stand with min A. PT Short Term Goal 3 (Week 1): Pt to perform bed<>chair with min A. PT Short Term Goal 4 (Week 1): Pt to perform w/c mobility x25' in controlled environment with max A. PT Short Term Goal 5 (Week 1): Pt to perform gait x50' in controlled environment with rolling walker and min guard.  Skilled Therapeutic Interventions/Progress Updates:   Treatment 1: Pt seated in recliner chair with wife present upon PT entering room. Pt agreeable to therapy but reports feeling "slow" this morning. Pt completes sit to stand and stand to sit transfers with min A and verbal cues for hand placement and sequencing. Cues for promoting forward weight shift. Pt transferred to wheel chair with use of RW. Pt propelled wheel chair in hallways 160 feet using bilateral lower extremities at supervision level with minimal cues for safety awareness. After short rest break, pt able to ambulate 110 feet using RW and CGA with cues to promote upright posture, increased heel strike of bilateral lower extremities, increased hip and knee flexion and increased safety awareness. Pt instructed in stair negotiation, up/down 6 steps using a L railing, negotiating sideways with cues for foot placement and safety. Pt request to use the bathroom. Pt ambulated to bathroom in the apt with use of RW. Pt able to pull pants down with CGA for steadying. Pt performed hygiene sitting on toilet at supervision level. Pt requires min A for steadying at sink to wash his hands. Pt ambulated back to the gym and completed the nu-step x10 min, level 4. Pt returned to his room, seated in recliner chair with wife present,  awaiting OT session.   Treatment 2: Patient on hold in pm secondary to change in medical status. Stroke team called for assessment late in the am after patient became very weak on the right side and presented with slurred speech.  Possible new CVA. Results are pending.    Pain: Pain Assessment Pain Assessment: No/denies pain Pain Score: 0-No pain  See FIM for current functional status  Therapy/Group:  Individual   Tonesha Tsou R 01/05/2014, 11:31 AM

## 2014-01-06 ENCOUNTER — Inpatient Hospital Stay (HOSPITAL_COMMUNITY): Payer: Medicare Other

## 2014-01-06 ENCOUNTER — Inpatient Hospital Stay (HOSPITAL_COMMUNITY): Payer: Medicare Other | Admitting: Physical Therapy

## 2014-01-06 DIAGNOSIS — I6350 Cerebral infarction due to unspecified occlusion or stenosis of unspecified cerebral artery: Principal | ICD-10-CM

## 2014-01-06 DIAGNOSIS — I10 Essential (primary) hypertension: Secondary | ICD-10-CM

## 2014-01-06 LAB — APTT
APTT: 50 s — AB (ref 24–37)
aPTT: 37 seconds (ref 24–37)
aPTT: 59 seconds — ABNORMAL HIGH (ref 24–37)

## 2014-01-06 LAB — GLUCOSE, CAPILLARY
GLUCOSE-CAPILLARY: 77 mg/dL (ref 70–99)
Glucose-Capillary: 112 mg/dL — ABNORMAL HIGH (ref 70–99)
Glucose-Capillary: 112 mg/dL — ABNORMAL HIGH (ref 70–99)
Glucose-Capillary: 76 mg/dL (ref 70–99)
Glucose-Capillary: 79 mg/dL (ref 70–99)
Glucose-Capillary: 97 mg/dL (ref 70–99)
Glucose-Capillary: 99 mg/dL (ref 70–99)

## 2014-01-06 LAB — TROPONIN I: Troponin I: <0.3 ng/mL (ref <0.30)

## 2014-01-06 LAB — COMPREHENSIVE METABOLIC PANEL
ALBUMIN: 2.3 g/dL — AB (ref 3.5–5.2)
ALT: 13 U/L (ref 0–53)
AST: 20 U/L (ref 0–37)
Alkaline Phosphatase: 69 U/L (ref 39–117)
BUN: 17 mg/dL (ref 6–23)
CALCIUM: 8.3 mg/dL — AB (ref 8.4–10.5)
CO2: 29 mEq/L (ref 19–32)
Chloride: 98 mEq/L (ref 96–112)
Creatinine, Ser: 0.82 mg/dL (ref 0.50–1.35)
GFR calc Af Amer: >90 mL/min (ref >90)
GFR calc non Af Amer: 79 mL/min — ABNORMAL LOW (ref >90)
Glucose, Bld: 86 mg/dL (ref 70–99)
POTASSIUM: 3.6 meq/L — AB (ref 3.7–5.3)
Sodium: 137 mEq/L (ref 137–147)
Total Bilirubin: 0.4 mg/dL (ref 0.3–1.2)
Total Protein: 6.1 g/dL (ref 6.0–8.3)

## 2014-01-06 LAB — HEPARIN LEVEL (UNFRACTIONATED)
Heparin Unfractionated: 0.85 IU/mL — ABNORMAL HIGH (ref 0.30–0.70)
Heparin Unfractionated: 0.51 IU/mL (ref 0.30–0.70)
Heparin Unfractionated: 0.36 IU/mL (ref 0.30–0.70)

## 2014-01-06 MED ORDER — HEPARIN (PORCINE) IN NACL 100-0.45 UNIT/ML-% IJ SOLN
950.0000 [IU]/h | INTRAMUSCULAR | Status: DC
  Administered 2014-01-06: 900 [IU]/h via INTRAVENOUS
  Filled 2014-01-06 (×2): qty 250

## 2014-01-06 MED ORDER — DIGOXIN 0.25 MG/ML IJ SOLN
0.1250 mg | Freq: Every day | INTRAMUSCULAR | Status: DC
Start: 2014-01-06 — End: 2014-01-06
  Filled 2014-01-06: qty 0.5

## 2014-01-06 MED ORDER — HEPARIN BOLUS VIA INFUSION
3000.0000 [IU] | Freq: Once | INTRAVENOUS | Status: AC
  Administered 2014-01-06: 02:00:00 3000 [IU] via INTRAVENOUS
  Filled 2014-01-06: qty 3000

## 2014-01-06 MED ORDER — KCL IN DEXTROSE-NACL 20-5-0.9 MEQ/L-%-% IV SOLN
INTRAVENOUS | Status: DC
  Administered 2014-01-06 – 2014-01-08 (×4): via INTRAVENOUS
  Filled 2014-01-06 (×7): qty 1000

## 2014-01-06 MED ORDER — POTASSIUM CHLORIDE IN NACL 20-0.9 MEQ/L-% IV SOLN
INTRAVENOUS | Status: DC
  Filled 2014-01-06: qty 1000

## 2014-01-06 NOTE — Progress Notes (Signed)
ANTICOAGULATION CONSULT NOTE - Initial Consult  Pharmacy Consult for Heparin  Indication: atrial fibrillation, Xarelto on hold for NPO  Allergies  Allergen Reactions  . Diazepam     REACTION: agitation  . Ezetimibe-Simvastatin     unknown  . Morphine Other (See Comments)    Hallucinations.  . Sulfonamide Derivatives Hives    Patient Measurements: Height: 6\' 2"  (188 cm) IBW/kg (Calculated) : 82.2 59.7 kg  Vital Signs: Temp: 98.4 F (36.9 C) (01/10 2040) Temp src: Axillary (01/10 2040) BP: 107/50 mmHg (01/11 0043) Pulse Rate: 60 (01/11 0043)  Labs:  Recent Labs  01/05/14 1339 01/05/14 1820 01/06/14 0025  HGB 11.1*  --   --   HCT 32.3*  --   --   PLT 300  --   --   APTT  --   --  37  HEPARINUNFRC  --   --  0.85*  CREATININE 0.82  --   --   TROPONINI  --  <0.30  --     Medical History: Past Medical History  Diagnosis Date  . NEOPLASM, MALIGNANT, BLADDER 04/10/2009  . HYPERLIPIDEMIA 04/10/2009  . ANEMIA-NOS 04/10/2009  . 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 ANXIETY 04/10/2009  . HYPERTENSION 04/10/2009  . Atrial fibrillation 04/10/2009  . COPD 04/10/2009  . TRANSIENT ISCHEMIC ATTACK, HX OF 04/10/2009  . Bladder cancer     Dr. Lawerance Bach  . Lacunar stroke 04/07/2011  . Pleural effusion 04/07/2011  . 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 Stroke    Assessment: 80 y/o M to start heparin for afib while Xarelto is on hold for NPO status 2/2 dysphagia. Last dose of Xarelto 1/9. Baseline HL is 0.85 and baseline aPTT is 37, so will use aPTT to guide dosing for now. Other labs as above.   Goal of Therapy:  Heparin level 0.3-0.7 units/ml aPTT 66-102 seconds Monitor platelets by anticoagulation protocol: Yes   Plan:  -Heparin 3000 units BOLUS -Start heparin drip at 900 units/hr -8 hour HL/aPTT at 1000 -Daily CBC/HL -F/U restart Xarelto after dysphagia w/u  Narda Bonds 01/06/2014,1:26 AM

## 2014-01-06 NOTE — Consult Note (Signed)
Cardiology Consult Note   Patient ID: [name] MRN: [id number], DOB/AGE: 78-05-1927   Admit date: 01/05/2014 Date of Consult: 01/06/2014  Primary Physician: Cathlean Cower, MD Primary Cardiologist: Vaughan Browner, MD  Reason for consult: atrial fibrillation with RVR  HPI: [name] is a 79 y.o. male permanent atrial fibrillation w/ difficult to control rates, chronic Xarelto anticoagulation, recurrent CVAs, h/o bladder CA s/p surgery and residual hematuria, HTN, HLD, COPD and remote ITP s/p splenectomy.   He recently suffered an acute CVA and transferred to CIR 1/6 manifested as AMS, dysarthria and dysphagia. UTI was felt to be playing a role and was treated. He had been off Xarelto due to hematuria. This was resumed and he was transferred to CIR. Yesterday, he again developed AMS, R arm weakness, dysarthria and dysphagia. Code stroke was activated. Initial head CT is unrevealing. Follow-up MRI pending.    He has permanent atrial fibrillation. HR difficult to control in the past per T. Mare Ferrari, MD. He has been sensitive to adjustments in rate-control agents. He takes diltiazem and digoxin. He is currently on a Cardizem gtt and digoxin. He is heparinized as he is NPO. Last dig level 0.7 on 1/7. HR currently 110-120s. U/a- turbid, few bacteria, negative nitrites, few leuk's, TNTC RBCs.   Unable to view meds given at CIR. Xarelto not administered last PM. Heparin gtt, no bolus, started at 0147 per [facility].   Cardiology consulted for further recommendations.  2D echo 05/2013: EF 55-60%, mild LVH, moderate biatrial enlargement  Telemetry review reveals atrial fibrillation, HR varies 100-120s. TnI x 3 WNL. H/H stable at ~11/32. Patient dysphasic. History supplemented by wife.   Problem List: Past Medical History  Diagnosis Date  . NEOPLASM, MALIGNANT, BLADDER 04/10/2009  . HYPERLIPIDEMIA 04/10/2009  . ANEMIA-NOS 04/10/2009  . 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 ANXIETY 04/10/2009  .  HYPERTENSION 04/10/2009  . Atrial fibrillation 04/10/2009  . COPD 04/10/2009  . TRANSIENT ISCHEMIC ATTACK, HX OF 04/10/2009  . Bladder cancer     Dr. Lawerance Bach  . Lacunar stroke 04/07/2011  . Pleural effusion 04/07/2011  . 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 Stroke     Past Surgical History  Procedure Laterality Date  . Splenectomy    . Bilateral vats ablation    . Facial cancer      facial skin cancer     Allergies:  Allergies  Allergen Reactions  . Diazepam     REACTION: agitation  . Ezetimibe-Simvastatin     unknown  . Morphine Other (See Comments)    Hallucinations.  . Sulfonamide Derivatives Hives    Home Medications: Prior to Admission medications   Medication Sig Start Date End Date Taking? Authorizing Provider  Cholecalciferol (VITAMIN D) 2000 UNITS tablet Take 2,000-3,000 Units by mouth daily. Patient takes 2000 units daily, except for taking 3000 units (1.5 tablets) on "winter days" and days when "not feeling well."   Yes Historical Provider, MD  digoxin (LANOXIN) 0.125 MG tablet Take 1 tablet (0.125 mg total) by mouth daily. 04/24/13  Yes Darlin Coco, MD  diltiazem (CARDIZEM CD) 300 MG 24 hr capsule Take 1 capsule (300 mg total) by mouth daily. 01/01/14  Yes Nishant Dhungel, MD  Maltodextrin-Xanthan Gum (RESOURCE THICKENUP CLEAR) POWD Take 120 g by mouth as needed. 01/01/14  Yes Nishant Dhungel, MD  Probiotic Product (PROBIOTIC DAILY PO) Take 1 tablet by mouth every morning.    Yes Historical Provider, MD  Rivaroxaban (XARELTO) 15 MG TABS tablet Take 1 tablet (15 mg  total) by mouth daily. 06/05/13  Yes Darlin Coco, MD    Inpatient Medications:  . cefTRIAXone (ROCEPHIN)  IV  1 g Intravenous Q24H  . digoxin  0.125 mg Oral Daily  . sodium chloride  3 mL Intravenous Q12H   Prescriptions prior to admission  Medication Sig Dispense Refill  . Cholecalciferol (VITAMIN D) 2000 UNITS tablet Take 2,000-3,000 Units by mouth daily. Patient takes 2000 units daily, except for  taking 3000 units (1.5 tablets) on "winter days" and days when "not feeling well."      . digoxin (LANOXIN) 0.125 MG tablet Take 1 tablet (0.125 mg total) by mouth daily.  30 tablet  5  . diltiazem (CARDIZEM CD) 300 MG 24 hr capsule Take 1 capsule (300 mg total) by mouth daily.  30 capsule  0  . Maltodextrin-Xanthan Gum (RESOURCE THICKENUP CLEAR) POWD Take 120 g by mouth as needed.  10 Can  0  . Probiotic Product (PROBIOTIC DAILY PO) Take 1 tablet by mouth every morning.       . Rivaroxaban (XARELTO) 15 MG TABS tablet Take 1 tablet (15 mg total) by mouth daily.  30 tablet  12    Family History  Problem Relation Age of Onset  . Heart disease Mother   . Arthritis Father      History   Social History  . Marital Status: Married    Spouse Name: N/A    Number of Children: N/A  . Years of Education: N/A   Occupational History  . 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 Smoking status: Never Smoker   . Smokeless tobacco: Never Used  . Alcohol Use: No  . Drug Use: No  . Sexual Activity: Not on file   Other Topics Concern  . Not on file   Social History Narrative   Wife is [name] - pt. Of [place]     Review of Systems:  Unable to fully assess due to dysarthria.   All other systems reviewed and are otherwise negative except as noted above.  Physical Exam: Blood pressure 97/57, pulse 109, temperature 97.9 F (36.6 C), temperature source Oral, resp. rate 20, height 6\' 2"  (1.88 m), weight 58.6 kg (129 lb 3 oz), SpO2 98.00%.    General: Well developed, thin appearing elderly male, in no acute distress. Head: Normocephalic, atraumatic, sclera non-icteric, no xanthomas, nares are without discharge. Left facial droop, dysarthria.  Neck:  Negative for carotid bruits. JVD not elevated. Lungs: Clear bilaterally to auscultation without wheezes, rales, or rhonchi. Breathing is unlabored. Heart: Irregularly irregular, with S1  S2. No murmurs, rubs, or gallops appreciated. Abdomen: Soft, non-tender, non-distended with normoactive bowel sounds. No hepatomegaly. No rebound/guarding. No obvious abdominal masses. Msk:  Strength and tone appears normal for age. Extremities: No clubbing, cyanosis or edema.  Distal pedal pulses are 2+ and equal bilaterally. Neuro: RUE weakness, dysarthria, facial droop. Alert and oriented X 3. Moves all extremities spontaneously. Psych:  Responds to questions appropriately with a normal affect.  Labs: Recent Labs     01/05/14  1339  WBC  8.8  HGB  11.1*  HCT  32.3*  MCV  96.4  PLT  300   Recent Labs Lab 01/02/14 0528 01/05/14 1339 01/06/14 0355  NA 139 138 137  K 4.1 4.6 3.6*  CL 102 100 98  CO2 27 27 29   BUN 23 24* 17  CREATININE 0.90 0.82 0.82  CALCIUM 8.5  9.0 8.3*  PROT 6.3  --  6.1  BILITOT 0.5  --  0.4  ALKPHOS 75  --  69  ALT 14  --  13  AST 21  --  20  GLUCOSE 110* 101* 86   Recent Labs     01/05/14  1820  01/06/14  0036  01/06/14  0355  TROPONINI  <0.30  <0.30  <0.30   Radiology/Studies: Dg Chest 2 View  12/25/2013   CLINICAL DATA:  Weakness  EXAM: CHEST  2 VIEW  COMPARISON:  08/21/2012  FINDINGS: Heart size and mediastinal contours are normal. The vascular pattern is normal. Bilateral pleural apical thickening is stable. There is no consolidation or effusion. Chronic left rib deformities due to prior fractures, stable.  IMPRESSION: No active cardiopulmonary disease.   Electronically Signed   By: Skipper Cliche M.D.   On: 12/25/2013 16:55   Ct Head Wo Contrast  01/05/2014   CLINICAL DATA:  80 year old male with right upper extremity weakness and aphasia. Code stroke.  EXAM: CT HEAD WITHOUT CONTRAST  TECHNIQUE: Contiguous axial images were obtained from the base of the skull through the vertex without intravenous contrast.  COMPARISON:  12/27/2013 CT and MRI and prior examinations.  FINDINGS: Moderate chronic small-vessel white matter ischemic changes,  generalized cerebral volume loss and remote lacunar infarcts within the deep white matter, basal ganglia and thalami again noted.  No acute intracranial abnormalities are identified, including mass lesion or mass effect, hydrocephalus, extra-axial fluid collection, midline shift, hemorrhage, or acute infarction. The visualized bony calvarium is unremarkable.  IMPRESSION: No evidence of acute intracranial abnormality.  Atrophy, chronic small-vessel white matter ischemic changes and remote infarcts.  Critical Value/emergent results were called by telephone at the time of interpretation on 01/05/2014 at 12:08 PM to Dr. Wallie Char , who verbally acknowledged these results.   Electronically Signed   By: Hassan Rowan M.D.   On: 01/05/2014 12:08   Ct Head Wo Contrast  12/27/2013   CLINICAL DATA:  Patient having trouble finding words.  EXAM: CT HEAD WITHOUT CONTRAST  TECHNIQUE: Contiguous axial images were obtained from the base of the skull through the vertex without intravenous contrast.  COMPARISON:  08/20/2012  FINDINGS: There is no evidence of mass effect, midline shift, or extra-axial fluid collections. There is no evidence of a space-occupying lesion or intracranial hemorrhage. There is no evidence of a cortical-based area of acute infarction. There is an old right corona radiata infarct. There is an old left basal ganglia lacunar infarct. . There is generalized cerebral atrophy. There is periventricular white matter low attenuation likely secondary to microangiopathy.  The ventricles and sulci are appropriate for the patient's age. The basal cisterns are patent.  Visualized portions of the orbits are unremarkable. The visualized portions of the paranasal sinuses and mastoid air cells are unremarkable.  The osseous structures are unremarkable.  IMPRESSION: No acute intracranial pathology.   Electronically Signed   By: Kathreen Devoid   On: 12/27/2013 11:14   Mr Brain Wo Contrast  12/27/2013   CLINICAL DATA:  Stroke.   Altered mental status.  Speech disturbance.  EXAM: MRI HEAD WITHOUT CONTRAST  TECHNIQUE: Multiplanar, multiecho pulse sequences of the brain and surrounding structures were obtained without intravenous contrast.  COMPARISON:  Head CT same day .  FINDINGS: Diffusion imaging shows a punctate acute infarction in the left parietal lobe near the vertex. No other acute insult. There chronic small-vessel changes affecting the pons. There are old small vessel cerebellar  infarctions. There are numerous old lacunar infarctions affecting the basal ganglia, thalami and hemispheric deep white matter. There is widespread chronic small vessel disease throughout the white matter of the cerebral hemispheres. No mass lesion, acute hemorrhage, hydrocephalus or extra-axial collection. No pituitary mass. No inflammatory sinus disease. There is a small amount of fluid in the mastoid air cells on the right. Major vessels at the base of the brain show flow.  IMPRESSION: Punctate acute infarction in the high left parietal lobe.  Extensive chronic ischemic changes elsewhere throughout the brain as outlined above.   Electronically Signed   By: Nelson Chimes M.D.   On: 12/27/2013 17:01   EKG: formal tracing pending  ASSESSMENT AND PLAN:   1. Dysarthria, dysphagia, R hemiparesis 2. Recent left parietal CVA 3. Permanent atrial fibrillation w/ RVR 4. Hematuria 5. H/o bladder CA s/p surgery 6. HTN 7. HLD 8. COPD 9. Possible UTI  The patient is re-admitted for R sided hemiparesis, dysphagia and dysarthria. Initial noncontrast CT w/ no new findings. Repeat MRI pending. Initial CVA likely due to Xarelto interruption from hematuria. Unclear that this has been interrupted since however. Question re-infarction. CT w/o evidence of hemorrhagic conversion. Neurology recommended continued Xarelto use yesterday. The patient is at high risk for recurrent CVA. He is heparinized currently as he is NPO.   The patient's rate is fairly  well-controlled currently. He has a h/o difficult to control rates in the past. This is more likely in the setting of acute CVA +/- UTI. Continue digoxin for now-- check dig level. Titrate diltiazem gtt as BP tolerates. BP a bit soft this AM. Not on antihypertensive aside from dilt. Would not aim for aggressive rate-control. Avoid antiarrhythmics as he has not been fully anticoagulated for 3-4 weeks.    Signed, R. Valeria Batman, PA-C 01/06/2014, 9:06 AM  Agree with assessment and plan as noted above.  The patient is currently n.p.o. we will continue anticoagulation with IV heparin until he is able to take oral anticoagulants again.  Likewise we will control his ventricular response with intravenous Cardizem until he was able to resume normal Cardizem dose by mouth.  Digoxin can also be continued intravenously.  On exam today he has significant dysarthria and expressive aphasia.  He understands what is being spoken to him.  His lungs are clear.  The heart reveals no gallop.  The rhythm is irregular.  The abdomen is soft and nontender.  Extremities show 1+ ankle edema.  Speech therapy evaluation is pending.

## 2014-01-06 NOTE — Progress Notes (Signed)
[facility] PROGRESS NOTE  [name] [id number] DOB: 07/16/1927 DOA: 01/05/2014 PCP: Cathlean Cower, MD  Assessment/Plan:  Altered mental status;  Possibly from infection. Concerning for new CVA. But will rule out new CVA. MRI brain ordered and pending. Neuro checks on board. Neurology following.   Atrial fibrillation with RVR:  Admitted to telemetry and started on IV cardizem,. Rate currently controlled.  Will consult cardiology for recommendations, Pt's cardiologist is Dr. Mare Ferrari. Currently on heparin drip Resume digoxin   Serial troponins negative for acute myocardial injury  UTI:  Started on rocephin and urine cultures pending.   Dysphagia:  Currently NPO, will get SLP eval - (pending)  Hypokalemia - will replace IV  Code Status: full code  Family Communication: no family at bedside  Disposition Plan: admit to telemetry.   HPI/Subjective: Pt is very confused this morning, not able to give concise answers  Objective: Filed Vitals:   01/06/14 0501  BP: 97/57  Pulse: 109  Temp: 97.9 F (36.6 C)  Resp: 20    Intake/Output Summary (Last 24 hours) at 01/06/14 0729 Last data filed at 01/06/14 0600  Gross per 24 hour  Intake 152.95 ml  Output    750 ml  Net -597.05 ml   Filed Weights   01/06/14 0501  Weight: 129 lb 3 oz (58.6 kg)    Exam:  General: CONFUSED, appears comfortable  Eyes: PERRL, normal lids, irises & conjunctiva  ENT: grossly normal hearing, lips & tongue  Neck: no LAD, masses or thyromegaly  Cardiovascular: irregular, RR, no m/r/g. No LE edema.  Respiratory: CTA bilaterally. Normal respiratory effort.  Abdomen: soft,  BS+ No distention.  Skin: no rash or induration seen on limited exam  Musculoskeletal: grossly normal tone BUE/BLE  Neurologic: no facial droop, able to move all extremities   Data Reviewed: Basic Metabolic Panel:  Recent Labs Lab 01/02/14 0528 01/05/14 1339 01/06/14 0355  NA 139 138 137  K 4.1 4.6 3.6*  CL  102 100 98  CO2 27 27 29   GLUCOSE 110* 101* 86  BUN 23 24* 17  CREATININE 0.90 0.82 0.82  CALCIUM 8.5 9.0 8.3*   Liver Function Tests:  Recent Labs Lab 01/02/14 0528 01/06/14 0355  AST 21 20  ALT 14 13  ALKPHOS 75 69  BILITOT 0.5 0.4  PROT 6.3 6.1  ALBUMIN 2.5* 2.3*   No results found for this basename: LIPASE, AMYLASE,  in the last 168 hours No results found for this basename: AMMONIA,  in the last 168 hours CBC:  Recent Labs Lab 01/02/14 0528 01/05/14 1339  WBC 5.8 8.8  NEUTROABS 3.8 7.4  HGB 11.1* 11.1*  HCT 33.3* 32.3*  MCV 96.5 96.4  PLT 195 300   Cardiac Enzymes:  Recent Labs Lab 01/05/14 1820 01/06/14 0036 01/06/14 0355  TROPONINI <0.30 <0.30 <0.30   BNP (last 3 results) No results found for this basename: PROBNP,  in the last 8760 hours CBG:  Recent Labs Lab 01/05/14 2037 01/06/14 0016 01/06/14 0403  GLUCAP 91 76 77    Recent Results (from the past 240 hour(s))  URINE CULTURE     Status: None   Collection Time    01/02/14  5:55 AM      Result Value Range Status   Specimen Description URINE, CLEAN CATCH   Final   Special Requests NONE   Final   Culture  Setup Time     Final   Value: 01/02/2014 11:02     Performed at [facility]   Colony Count     Final   Value: >=100,000 COLONIES/ML     Performed at [facility]   Culture     Final   Value: Multiple bacterial morphotypes present, none predominant. Suggest appropriate recollection if clinically indicated.     Performed at [facility]   Report Status 01/03/2014 FINAL   Final  URINE CULTURE     Status: None   Collection Time    01/04/14 10:14 AM      Result Value Range Status   Specimen Description URINE, CATHETERIZED   Final   Special Requests NONE   Final   Culture  Setup Time     Final   Value: 01/04/2014 11:20     Performed at [place] PENDING   Incomplete   Culture     Final   Value: Culture reincubated for better growth      Performed at [facility]   Report Status PENDING   Incomplete     Studies: Ct Head Wo Contrast  01/05/2014   CLINICAL DATA:  80 year old male with right upper extremity weakness and aphasia. Code stroke.  EXAM: CT HEAD WITHOUT CONTRAST  TECHNIQUE: Contiguous axial images were obtained from the base of the skull through the vertex without intravenous contrast.  COMPARISON:  12/27/2013 CT and MRI and prior examinations.  FINDINGS: Moderate chronic small-vessel white matter ischemic changes, generalized cerebral volume loss and remote lacunar infarcts within the deep white matter, basal ganglia and thalami again noted.  No acute intracranial abnormalities are identified, including mass lesion or mass effect, hydrocephalus, extra-axial fluid collection, midline shift, hemorrhage, or acute infarction. The visualized bony calvarium is unremarkable.  IMPRESSION: No evidence of acute intracranial abnormality.  Atrophy, chronic small-vessel white matter ischemic changes and remote infarcts.  Critical Value/emergent results were called by telephone at the time of interpretation on 01/05/2014 at 12:08 PM to Dr. Wallie Char , who verbally acknowledged these results.   Electronically Signed   By: Hassan Rowan M.D.   On: 01/05/2014 12:08    Scheduled Meds: . cefTRIAXone (ROCEPHIN)  IV  1 g Intravenous Q24H  . digoxin  0.125 mg Oral Daily  . sodium chloride  3 mL Intravenous Q12H   Continuous Infusions: . sodium chloride 100 mL/hr at 01/05/14 1600  . diltiazem (CARDIZEM) infusion 5 mg/hr (01/06/14 0059)  . heparin 900 Units/hr (01/06/14 0147)    Active Problems:   Altered mental status   [facility] Pager [phone]. If 7PM-7AM, please contact night-coverage at [email or web address], password [facility] 01/06/2014, 7:29 AM  LOS: 1 day

## 2014-01-06 NOTE — Evaluation (Signed)
Physical Therapy Evaluation Patient Details Name: [name] MRN: [id number] DOB: [birth year] Today's Date: 01/06/2014 Time: 4098-1191 PT Time Calculation (min): 29 min  PT Assessment / Plan / Recommendation History of Present Illness  Pt with history of COPD, prior CVA 08/2012 with right sided weakness and dysphagia ( CIR stay) , A fib--Xarelto and bladder cancer with ongoing treatment at [facility]. He was admitted 12/25/2013 with altered mental status and low grade fever and reported hematuria. Cranial CT scan negative and MRI of the brain showed punctate acute infarct in the high left parietal lobe as well as extensive chronic ischemic changes. Patient transferred to CIR on 01/01/14.  On 01/05/14 patient with increased weakness, slurring speech.  Code stroke called and pt returned to acute care for further w/u.  MRI pending.  Clinical Impression  Patient weaker and requires more assistance than on Friday when on CIR.  Patient would benefit from PT to increase mobility and independence for return to CIR and ultimate d/c home.      PT Assessment  Patient needs continued PT services    Follow Up Recommendations  CIR    Does the patient have the potential to tolerate intense rehabilitation      Barriers to Discharge        Equipment Recommendations  Other (comment) (tbd with increased mobility)    Recommendations for Other Services Rehab consult   Frequency Min 4X/week    Precautions / Restrictions Precautions Precautions: Fall Precaution Comments: flu withr recent admission, now resolved   Pertinent Vitals/Pain No pain indicated except when donning condom cath with RN      Mobility  Bed Mobility Overal bed mobility: Needs Assistance Bed Mobility: Supine to Sit;Sit to Supine;Rolling Rolling: Mod assist Supine to sit: Mod assist Sit to supine: Mod assist General bed mobility comments: rolling to clean patient after condom cath fell off    Exercises     PT Diagnosis: Difficulty  walking;Generalized weakness  PT Problem List: Decreased strength;Decreased balance;Decreased activity tolerance;Decreased coordination;Decreased mobility PT Treatment Interventions: Gait training;Stair training;Functional mobility training;Therapeutic activities;Therapeutic exercise;Balance training;Neuromuscular re-education;Patient/family education     PT Goals(Current goals can be found in the care plan section) Acute Rehab PT Goals Patient Stated Goal: Family:  get him back to CIR PT Goal Formulation: With patient/family Time For Goal Achievement: 01/20/14 Potential to Achieve Goals: Good  Visit Information  Last PT Received On: 01/06/14 Assistance Needed: +2 History of Present Illness: Pt with history of COPD, prior CVA 08/2012 with right sided weakness and dysphagia ( CIR stay) , A fib--Xarelto and bladder cancer with ongoing treatment at [facility]. He was admitted 12/25/2013 with altered mental status and low grade fever and reported hematuria. Cranial CT scan negative and MRI of the brain showed punctate acute infarct in the high left parietal lobe as well as extensive chronic ischemic changes. Patient transferred to CIR on 01/01/14.  On 01/05/14 patient with increased weakness, slurring speech.  Code stroke called and pt returned to acute care for further w/u.  MRI pending.       Prior [place] expects to be discharged to:: Inpatient rehab Living Arrangements: Spouse/significant other Type of Home: House Home Access: Stairs to enter Entrance Stairs-Number of Steps: 4 Entrance Stairs-Rails: Right Home Layout: One level Home Equipment: Shower seat;Hand held shower head Additional Comments: tub shower.  Prior Function Level of Independence: Independent Comments: wife assists with setting out clothes. Pt has bladder incontinence (Wife would help him set up things, but he  could perform most) Communication Communication: Expressive difficulties (Cognitive  deficits) Dominant Hand: Right    Cognition  Cognition Arousal/Alertness: Awake/alert Behavior During Therapy: WFL for tasks assessed/performed Overall Cognitive Status: Difficult to assess Difficult to assess due to: Impaired communication    Extremity/Trunk Assessment Upper Extremity Assessment Upper Extremity Assessment: Defer to OT evaluation Lower Extremity Assessment Lower Extremity Assessment: Generalized weakness Cervical / Trunk Assessment Cervical / Trunk Assessment: Kyphotic   Balance Balance Overall balance assessment: Needs assistance Sitting-balance support: Bilateral upper extremity supported Sitting balance-Leahy Scale: Poor Dynamic Sitting - Comments: sat EOB x 5 minutes, working up maintaining upright; required max verbal cues to maintain upright  End of Session PT - End of Session Activity Tolerance: Patient limited by fatigue Patient left: in bed;with call bell/phone within reach;with family/visitor present  [place], Smiley, Guin 01/06/2014, 3:56 PM

## 2014-01-06 NOTE — Progress Notes (Signed)
ANTICOAGULATION CONSULT NOTE - Follow Up Consult  Pharmacy Consult for heparin Indication: afib  Allergies  Allergen Reactions  . Diazepam     REACTION: agitation  . Ezetimibe-Simvastatin     unknown  . Morphine Other (See Comments)    Hallucinations.  . Sulfonamide Derivatives Hives    Patient Measurements: Height: 6\' 2"  (188 cm) Weight: 129 lb 3 oz (58.6 kg) IBW/kg (Calculated) : 82.2   Vital Signs: Temp: 97.6 F (36.4 C) (01/11 1952) Temp src: Oral (01/11 1952) BP: 113/75 mmHg (01/11 1952) Pulse Rate: 99 (01/11 1952)  Labs:  Recent Labs  01/05/14 1339 01/05/14 1820 01/06/14 0025 01/06/14 0036 01/06/14 0355 01/06/14 0940 01/06/14 1854  HGB 11.1*  --   --   --   --   --   --   HCT 32.3*  --   --   --   --   --   --   PLT 300  --   --   --   --   --   --   APTT  --   --  37  --   --  50* 59*  HEPARINUNFRC  --   --  0.85*  --   --  0.51 0.36  CREATININE 0.82  --   --   --  0.82  --   --   TROPONINI  --  <0.30  --  <0.30 <0.30  --   --     Estimated Creatinine Clearance: 55.6 ml/min (by C-G formula based on Cr of 0.82).   Medications:  Infusions:  . dextrose 5 % and 0.9 % NaCl with KCl 20 mEq/L 70 mL/hr at 01/06/14 0913  . diltiazem (CARDIZEM) infusion Stopped (01/06/14 1133)  . heparin 950 Units/hr (01/06/14 1120)    Assessment: 80 yo male on heparin for afib and possible CVA. Patient on xarelto PTA and currently on hold. Heparin is at goal (HL= 0.36) on 950 units/hr. Last xarelto taken 01/04/13 and anticipate effect on Heparin level should be minimal.  Goal of Therapy:  Heparin level- 0.3-0.5 Monitor platelets by anticoagulation protocol: Yes    Plan:  -No heparin changes needed -Daily CBC and heparin level  -D/C daily aPTT  Hildred Laser, Pharm D 01/06/2014 8:16 PM

## 2014-01-06 NOTE — Plan of Care (Signed)
Problem: Acute Treatment Outcomes Goal: Neuro exam at baseline or improved Outcome: Progressing Pt is more Alert today, pt still having some delay on speech, but is improving, pt able to move extremities more purposely. Family member at the bedside. Pt continues with Cardizem gtt and Heparin gtt as ordered. We'll continue with POC.

## 2014-01-06 NOTE — Progress Notes (Signed)
Subjective: Patient has continued to have difficulty with speech output and use of his right upper extremity. Facial droop is also persisted. He said no recurrence of lethargy.  Objective: Current vital signs: BP 97/57  Pulse 109  Temp(Src) 97.9 F (36.6 C) (Oral)  Resp 20  Ht 6\' 2"  (1.88 m)  Wt 58.6 kg (129 lb 3 oz)  BMI 16.58 kg/m2  SpO2 98%  Neurologic Exam: Alert and in no acute distress. Mental status was normal. Patient had moderate expressive aphasia as well as dysarthria. Right facial weakness also noted. Mild weakness of right upper extremity with moderate coordination difficulty noted.  MRI of brain without contrast today showed findings consistent with multiple new ischemic infarctions involving the left PCA territory, including the thalamus, occipital region and left posterior temporal region.  Medications: I have reviewed the patient's current medications.  Assessment/Plan: Recurrent left cerebral infarction with involvement of posterior cerebral artery territory on the left as described above, most likely embolic of arterial source.  Recommend no changes in current management including anticoagulation with IV heparin drip. Long-term anticoagulation to be decided by stroke team and cardiologist. Inpatient rehabilitation will need to be reconsulted.  C.R. Nicole Kindred, MD [facility] [phone]  01/06/2014  5:48 PM

## 2014-01-06 NOTE — Progress Notes (Addendum)
ANTICOAGULATION CONSULT NOTE - Initial Consult  Pharmacy Consult for Heparin  Indication: atrial fibrillation, Xarelto on hold for NPO  Allergies  Allergen Reactions  . Diazepam     REACTION: agitation  . Ezetimibe-Simvastatin     unknown  . Morphine Other (See Comments)    Hallucinations.  . Sulfonamide Derivatives Hives    Patient Measurements: Height: 6\' 2"  (188 cm) Weight: 129 lb 3 oz (58.6 kg) IBW/kg (Calculated) : 82.2 59.7 kg  Vital Signs: Temp: 97.9 F (36.6 C) (01/11 0501) Temp src: Oral (01/11 0501) BP: 97/57 mmHg (01/11 0501) Pulse Rate: 109 (01/11 0501)  Labs:  Recent Labs  01/05/14 1339 01/05/14 1820 01/06/14 0025 01/06/14 0036 01/06/14 0355 01/06/14 0940  HGB 11.1*  --   --   --   --   --   HCT 32.3*  --   --   --   --   --   PLT 300  --   --   --   --   --   APTT  --   --  37  --   --  50*  HEPARINUNFRC  --   --  0.85*  --   --  0.51  CREATININE 0.82  --   --   --  0.82  --   TROPONINI  --  <0.30  --  <0.30 <0.30  --     Medical History: Past Medical History  Diagnosis Date  . NEOPLASM, MALIGNANT, BLADDER 04/10/2009  . HYPERLIPIDEMIA 04/10/2009  . ANEMIA-NOS 04/10/2009  . 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 ANXIETY 04/10/2009  . HYPERTENSION 04/10/2009  . Atrial fibrillation 04/10/2009  . COPD 04/10/2009  . TRANSIENT ISCHEMIC ATTACK, HX OF 04/10/2009  . Bladder cancer     Dr. Lawerance Bach  . Lacunar stroke 04/07/2011  . Pleural effusion 04/07/2011  . 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 Stroke    Assessment: 80 y/o M to start heparin for afib while Xarelto is on hold for NPO status 2/2 dysphagia. Last dose of Xarelto 1/9. Baseline HL is 0.85 and baseline aPTT is 37  01/11 AM HL 0.51, aPTT 50  Goal of Therapy:  Heparin level 0.3-0.7 units/ml aPTT 66-102 seconds Monitor platelets by anticoagulation protocol: Yes   Plan:  Appears HL may have started to normalize given last dose of Xarelto 01/09 but given aPTT slightly low, will bump  heparin drip slightly to 950 units/hr.  Check HL/aPTT in 8 hours to confirm.  Daily CBC/HL F/U restart Xarelto after dysphagia w/u   Ollen Gross B. Leitha Schuller, PharmD Clinical Pharmacist - Resident Pager: [phone] 01/06/2014 11:15 AM

## 2014-01-06 NOTE — Plan of Care (Signed)
Problem: Consults Goal: Ischemic Stroke Patient Education See Patient Education Module for education specifics.  Outcome: Progressing TIA orientation started with wife and son at the bedside.

## 2014-01-07 ENCOUNTER — Inpatient Hospital Stay (HOSPITAL_COMMUNITY): Payer: Medicare Other | Admitting: Physical Therapy

## 2014-01-07 ENCOUNTER — Encounter (HOSPITAL_COMMUNITY): Payer: Medicare Other

## 2014-01-07 ENCOUNTER — Inpatient Hospital Stay (HOSPITAL_COMMUNITY): Payer: Medicare Other

## 2014-01-07 DIAGNOSIS — I639 Cerebral infarction, unspecified: Secondary | ICD-10-CM | POA: Diagnosis present

## 2014-01-07 LAB — COMPREHENSIVE METABOLIC PANEL
ALT: 10 U/L (ref 0–53)
AST: 18 U/L (ref 0–37)
Albumin: 2.3 g/dL — ABNORMAL LOW (ref 3.5–5.2)
Alkaline Phosphatase: 67 U/L (ref 39–117)
BILIRUBIN TOTAL: 0.2 mg/dL — AB (ref 0.3–1.2)
BUN: 11 mg/dL (ref 6–23)
CO2: 27 mEq/L (ref 19–32)
CREATININE: 0.77 mg/dL (ref 0.50–1.35)
Calcium: 8.4 mg/dL (ref 8.4–10.5)
Chloride: 98 mEq/L (ref 96–112)
GFR calc Af Amer: >90 mL/min (ref >90)
GFR calc non Af Amer: 81 mL/min — ABNORMAL LOW (ref >90)
Glucose, Bld: 90 mg/dL (ref 70–99)
POTASSIUM: 3.8 meq/L (ref 3.7–5.3)
Sodium: 136 mEq/L — ABNORMAL LOW (ref 137–147)
Total Protein: 6.2 g/dL (ref 6.0–8.3)

## 2014-01-07 LAB — GLUCOSE, CAPILLARY
GLUCOSE-CAPILLARY: 88 mg/dL (ref 70–99)
GLUCOSE-CAPILLARY: 92 mg/dL (ref 70–99)
Glucose-Capillary: 102 mg/dL — ABNORMAL HIGH (ref 70–99)
Glucose-Capillary: 102 mg/dL — ABNORMAL HIGH (ref 70–99)

## 2014-01-07 LAB — URINE CULTURE

## 2014-01-07 LAB — MAGNESIUM: Magnesium: 1.9 mg/dL (ref 1.5–2.5)

## 2014-01-07 LAB — HEPARIN LEVEL (UNFRACTIONATED)
HEPARIN UNFRACTIONATED: 0.28 [IU]/mL — AB (ref 0.30–0.70)
HEPARIN UNFRACTIONATED: 0.31 [IU]/mL (ref 0.30–0.70)

## 2014-01-07 LAB — CBC
HEMATOCRIT: 27.3 % — AB (ref 39.0–52.0)
Hemoglobin: 9.5 g/dL — ABNORMAL LOW (ref 13.0–17.0)
MCH: 32.9 pg (ref 26.0–34.0)
MCHC: 34.8 g/dL (ref 30.0–36.0)
MCV: 94.5 fL (ref 78.0–100.0)
Platelets: 313 10*3/uL (ref 150–400)
RBC: 2.89 MIL/uL — AB (ref 4.22–5.81)
RDW: 13.9 % (ref 11.5–15.5)
WBC: 4.3 10*3/uL (ref 4.0–10.5)

## 2014-01-07 MED ORDER — HEPARIN (PORCINE) IN NACL 100-0.45 UNIT/ML-% IJ SOLN
1050.0000 [IU]/h | INTRAMUSCULAR | Status: DC
  Administered 2014-01-08: 01:00:00 1050 [IU]/h via INTRAVENOUS

## 2014-01-07 MED ORDER — DILTIAZEM HCL 60 MG PO TABS
60.0000 mg | ORAL_TABLET | Freq: Four times a day (QID) | ORAL | Status: DC
  Administered 2014-01-07 – 2014-01-09 (×6): 60 mg via ORAL
  Filled 2014-01-07 (×11): qty 1

## 2014-01-07 MED ORDER — RESOURCE THICKENUP CLEAR PO POWD
ORAL | Status: DC | PRN
  Filled 2014-01-07 (×2): qty 125

## 2014-01-07 NOTE — Progress Notes (Signed)
Pt did not pass bedside stroke screen- SLP - pt gone downstairs for Barium swallow.

## 2014-01-07 NOTE — Progress Notes (Signed)
Patient Name: [name] Date of Encounter: 01/07/2014     Principal Problem:   Acute CVA (cerebrovascular accident) Active Problems:   Altered mental status    SUBJECTIVE  Patient is unable to speak. Wife says that he can understand. She reports that he has no pain but does have a cough.   CURRENT MEDS . cefTRIAXone (ROCEPHIN)  IV  1 g Intravenous Q24H  . sodium chloride  3 mL Intravenous Q12H    OBJECTIVE  Filed Vitals:   01/06/14 0501 01/06/14 1952 01/07/14 0100 01/07/14 0444  BP: 97/57 113/75 109/72 103/71  Pulse: 109 99 90 89  Temp: 97.9 F (36.6 C) 97.6 F (36.4 C) 97.7 F (36.5 C) 97.1 F (36.2 C)  TempSrc: Oral Oral Oral Oral  Resp: 20 20 20 20   Height:      Weight: 129 lb 3 oz (58.6 kg)   128 lb 1.4 oz (58.1 kg)  SpO2: 98% 98% 98% 98%    Intake/Output Summary (Last 24 hours) at 01/07/14 0938 Last data filed at 01/07/14 0835  Gross per 24 hour  Intake 1828.27 ml  Output   1750 ml  Net  78.27 ml   Filed Weights   01/06/14 0501 01/07/14 0444  Weight: 129 lb 3 oz (58.6 kg) 128 lb 1.4 oz (58.1 kg)    PHYSICAL EXAM  General: No apparent distress, awake, alert  Eyes: PERRLA HENT: grossly normal hearing Neck: no LAD, masses or thyromegaly  Cardiovascular: irregular, RR, no m/r/g. No LE edema.  Respiratory: CTA bilaterally. Normal respiratory effort.  Abdomen: soft, BS+ No distention.  Skin: no rash  Musculoskeletal: grossly normal tone BUE/BLE  Neurologic: right facial droop, able to move all extremities, speech slightly improved  Accessory Clinical Findings  CBC  Recent Labs  01/05/14 1339 01/07/14 0445  WBC 8.8 4.3  NEUTROABS 7.4  --   HGB 11.1* 9.5*  HCT 32.3* 27.3*  MCV 96.4 94.5  PLT 300 976   Basic Metabolic Panel  Recent Labs  01/06/14 0355 01/07/14 0445  NA 137 136*  K 3.6* 3.8  CL 98 98  CO2 29 27  GLUCOSE 86 90  BUN 17 11  CREATININE 0.82 0.77  CALCIUM 8.3* 8.4  MG  --  1.9   Liver Function  Tests  Recent Labs  01/06/14 0355 01/07/14 0445  AST 20 18  ALT 13 10  ALKPHOS 69 67  BILITOT 0.4 0.2*  PROT 6.1 6.2  ALBUMIN 2.3* 2.3*    Cardiac Enzymes  Recent Labs  01/05/14 1820 01/06/14 0036 01/06/14 0355  TROPONINI <0.30 <0.30 <0.30    TELE  Afib HR 90-120s  Radiology/Studies  Dg Chest 2 View  01/06/2014   CLINICAL DATA:  Acute stroke  EXAM: CHEST  2 VIEW  COMPARISON:  12/25/2013  FINDINGS: Background COPD/ emphysema noted with mild hyperinflation and parenchymal scarring. Heart is enlarged with vascular congestion. No definite CHF, effusion or pneumonia. Skin folds overlie the mid chest regions bilaterally. Chronic apical scarring. No pneumothorax. Trachea is midline. Atherosclerosis of the aorta.  IMPRESSION: Chronic COPD/ emphysema  Cardiomegaly with vascular congestion  No definite pneumonia or CHF   Electronically Signed   By: Daryll Brod M.D.   On: 01/06/2014 12:53   Dg Chest 2 View  12/25/2013   CLINICAL DATA:  Weakness  EXAM: CHEST  2 VIEW  COMPARISON:  08/21/2012  FINDINGS: Heart size and mediastinal contours are normal. The vascular pattern is normal. Bilateral pleural apical thickening  is stable. There is no consolidation or effusion. Chronic left rib deformities due to prior fractures, stable.  IMPRESSION: No active cardiopulmonary disease.   Electronically Signed   By: Skipper Cliche M.D.   On: 12/25/2013 16:55   Ct Head Wo Contrast  01/05/2014   CLINICAL DATA:  80 year old male with right upper extremity weakness and aphasia. Code stroke.  EXAM: CT HEAD WITHOUT CONTRAST  TECHNIQUE: Contiguous axial images were obtained from the base of the skull through the vertex without intravenous contrast.  COMPARISON:  12/27/2013 CT and MRI and prior examinations.  FINDINGS: Moderate chronic small-vessel white matter ischemic changes, generalized cerebral volume loss and remote lacunar infarcts within the deep white matter, basal ganglia and thalami again noted.  No  acute intracranial abnormalities are identified, including mass lesion or mass effect, hydrocephalus, extra-axial fluid collection, midline shift, hemorrhage, or acute infarction. The visualized bony calvarium is unremarkable.  IMPRESSION: No evidence of acute intracranial abnormality.  Atrophy, chronic small-vessel white matter ischemic changes and remote infarcts.  Critical Value/emergent results were called by telephone at the time of interpretation on 01/05/2014 at 12:08 PM to Dr. Wallie Char , who verbally acknowledged these results.   Electronically Signed   By: Hassan Rowan M.D.   On: 01/05/2014 12:08   Ct Head Wo Contrast  12/27/2013   CLINICAL DATA:  Patient having trouble finding words.  EXAM: CT HEAD WITHOUT CONTRAST  TECHNIQUE: Contiguous axial images were obtained from the base of the skull through the vertex without intravenous contrast.  COMPARISON:  08/20/2012  FINDINGS: There is no evidence of mass effect, midline shift, or extra-axial fluid collections. There is no evidence of a space-occupying lesion or intracranial hemorrhage. There is no evidence of a cortical-based area of acute infarction. There is an old right corona radiata infarct. There is an old left basal ganglia lacunar infarct. . There is generalized cerebral atrophy. There is periventricular white matter low attenuation likely secondary to microangiopathy.  The ventricles and sulci are appropriate for the patient's age. The basal cisterns are patent.  Visualized portions of the orbits are unremarkable. The visualized portions of the paranasal sinuses and mastoid air cells are unremarkable.  The osseous structures are unremarkable.  IMPRESSION: No acute intracranial pathology.     Mr Brain Wo Contrast  01/06/2014   CLINICAL DATA:  Mental status changes.  Right arm weakness.  EXAM: MRI HEAD WITHOUT CONTRAST  TECHNIQUE: Multiplanar, multiecho pulse sequences of the brain and surrounding structures were obtained without intravenous  contrast.  COMPARISON:  Head CT 01/05/2014.  MRI 12/27/2013.  FINDINGS: Diffusion imaging shows new acute infarction in the left occipital lobe, left thalamus and left temporal lobe most consistent with PCA territory infarction. Areas of involvement show mild swelling but there is no hemorrhage or mass effect.  There chronic small vessel changes throughout the pons. There are old small vessel cerebellar infarctions. There are old lacunar infarctions within the basal ganglia and there are chronic small vessel changes throughout the cerebral hemispheric white matter. No mass lesion. No hydrocephalus or extra-axial collection. Major vessels at the base of the brain show flow.  IMPRESSION: Newly seen acute infarction within the left thalamus, left occipital lobe and left temporal lobe. Pattern of distribution most likely reflects left PCA territory infarction.    Mr Brain Wo Contrast  12/27/2013   CLINICAL DATA:  Stroke.  Altered mental status.  Speech disturbance.  EXAM: MRI HEAD WITHOUT CONTRAST  TECHNIQUE: Multiplanar, multiecho pulse sequences of the brain  and surrounding structures were obtained without intravenous contrast.  COMPARISON:  Head CT same day .  FINDINGS: Diffusion imaging shows a punctate acute infarction in the left parietal lobe near the vertex. No other acute insult. There chronic small-vessel changes affecting the pons. There are old small vessel cerebellar infarctions. There are numerous old lacunar infarctions affecting the basal ganglia, thalami and hemispheric deep white matter. There is widespread chronic small vessel disease throughout the white matter of the cerebral hemispheres. No mass lesion, acute hemorrhage, hydrocephalus or extra-axial collection. No pituitary mass. No inflammatory sinus disease. There is a small amount of fluid in the mastoid air cells on the right. Major vessels at the base of the brain show flow.  IMPRESSION: Punctate acute infarction in the high left parietal  lobe.  Extensive chronic ischemic changes elsewhere throughout the brain as outlined above.      ASSESSMENT AND PLAN [name] is a 80 y.o. male with permanent atrial fibrillation w/ difficult to control rates, chronic Xarelto anticoagulation, recurrent CVAs, h/o bladder CA s/p surgery and residual hematuria, HTN, HLD, COPD and remote ITP s/p splenectomy who was admitted on 01/05/14 for R sided hemiparesis, dysphagia and dysarthria. A newly seen acute infarction within the left thalamus, left occipital lobe and left temporal lobe was seen on MRI.   CVA --Pattern of distribution most likely reflects left PCA territory infarction. Neurology following. Pt currently on IV heparin. Currently NPO. Stroke team following   Atrial fibrillation with RVR:   -- On IV cardizem and rate currently controlled HR 100. BP still soft -- Home digoxin has no been restarted -- Continue heparin drip.  -- Serial troponins negative   UTI:  Started on rocephin and urine cultures pending.    Hypokalemia - repleted, following   Tyrell Antonio PA-C  Pager [phone]  History and all data above reviewed.  Patient examined.  I agree with the findings as above.  The patient exam reveals [id number]  ,  Lungs: Decreased breath sounds  ,  Abd: Positive bowel sounds, no rebound no guarding, Ext No edema, muscle wasting  .  All available labs, radiology testing, previous records reviewed. Agree with documented assessment and plan. Atrial fib rate controlled with IV Dilt until he is taking POs.  On heparin for afib with CVA.  Of note this is likely no a failure of Xarelto.  He did have apparently one dose held with hematuria and might have been subtherapeutic with a lower prescribed dose.  We will follow with neurology to discuss future anticoagulation.    Jeneen Rinks [name]  10:58 AM  01/07/2014

## 2014-01-07 NOTE — Progress Notes (Signed)
Pt now on Dysphagia 1 diet wth honey thick liquids no straws tuck chin and swallow twice. RN fed pt. HOB up 45 degrees just for meal and for 2-0 min after meal.  Tolerated well . Suction at bedside just in case. Family updated on how pt needs to eat.

## 2014-01-07 NOTE — Progress Notes (Signed)
ANTICOAGULATION CONSULT NOTE - Follow Up Consult  Pharmacy Consult for Heparin Indication: atrial fibrillation  Patient Measurements: Height: 6\' 2"  (188 cm) Weight: 128 lb 1.4 oz (58.1 kg) (bs) IBW/kg (Calculated) : 82.2 Heparin Dosing Weight: 58 kg  Labs:  Recent Labs  01/05/14 1339 01/05/14 1820  01/06/14 0025 01/06/14 0036 01/06/14 0355 01/06/14 0940 01/06/14 1854 01/07/14 0445 01/07/14 2035  HGB 11.1*  --   --   --   --   --   --   --  9.5*  --   HCT 32.3*  --   --   --   --   --   --   --  27.3*  --   PLT 300  --   --   --   --   --   --   --  313  --   APTT  --   --   --  37  --   --  50* 59*  --   --   HEPARINUNFRC  --   --   < > 0.85*  --   --  0.51 0.36 0.28* 0.31  CREATININE 0.82  --   --   --   --  0.82  --   --  0.77  --   TROPONINI  --  <0.30  --   --  <0.30 <0.30  --   --   --   --   < > = values in this interval not displayed.  Estimated Creatinine Clearance: 56.5 ml/min (by C-G formula based on Cr of 0.77).  Assessment:    Heparin level is low therapeutic (0.31) tonight on 1050 units/hr.  Goal of Therapy:  Heparin level 0.3-0.5 units/ml Monitor platelets by anticoagulation protocol: Yes   Plan:    Continue heparin drip at 1050 units/hr.   Next heparin level and CBC in am.  Arty Baumgartner, [place] Pager: [phone] 01/07/2014,9:08 PM

## 2014-01-07 NOTE — Progress Notes (Addendum)
Clinical Social Work Department CLINICAL SOCIAL WORK PLACEMENT NOTE 01/07/2014  Patient:  [name], [name]  Account Number:  [id number] [place] date:  01/05/2014  Clinical Social Worker:  Tilden Fossa, Latanya Presser  Date/time:  01/07/2014 12:38 PM  Clinical Social Work is seeking post-discharge placement for this patient at the following level of care:   SKILLED NURSING   (*CSW will update this form in Epic as items are completed)   01/07/2014  Patient/family provided with [place] Department of Clinical Social Work's list of facilities offering this level of care within the geographic area requested by the patient (or if unable, by the patient's family).  01/07/2014  Patient/family informed of their freedom to choose among providers that offer the needed level of care, that participate in Medicare, Medicaid or managed care program needed by the patient, have an available bed and are willing to accept the patient.    Patient/family informed of [facility]' ownership interest in [facility], as well as of the fact that they are under no obligation to receive care at this facility.  PASARR submitted to EDS on 01/07/2014 PASARR number received from EDS on 01/07/2014  FL2 transmitted to all facilities in geographic area requested by pt/family on  01/07/2014 FL2 transmitted to all facilities within larger geographic area on   Patient informed that his/her managed care company has contracts with or will negotiate with  certain facilities, including the following:     Patient/family informed of bed offers received:   Patient chooses bed at [place] Physician recommends and patient chooses bed at    Patient to be transferred to [place] on   01/09/14 Patient to be transferred to facility by   The following physician request were entered in Epic:   Additional Comments:  Tilden Fossa, MSW, [place] Worker [facility]  Dept. [phone]  01/10/14  DC to [facility] on 01/09/14.  DC arranged by Cyril Mourning Drinkard, LCSW.   Lorie Phenix. [place], [place]

## 2014-01-07 NOTE — Progress Notes (Signed)
ANTICOAGULATION CONSULT NOTE - Follow Up Consult  Pharmacy Consult:  Heparin Indication: atrial fibrillation  Allergies  Allergen Reactions  . Diazepam     REACTION: agitation  . Ezetimibe-Simvastatin     unknown  . Morphine Other (See Comments)    Hallucinations.  . Sulfonamide Derivatives Hives    Patient Measurements: Height: 6\' 2"  (188 cm) Weight: 128 lb 1.4 oz (58.1 kg) (bs) IBW/kg (Calculated) : 82.2   Vital Signs: Temp: 97.1 F (36.2 C) (01/12 0444) Temp src: Oral (01/12 0444) BP: 103/71 mmHg (01/12 0444) Pulse Rate: 89 (01/12 0444)  Labs:  Recent Labs  01/05/14 1339 01/05/14 1820  01/06/14 0025 01/06/14 0036 01/06/14 0355 01/06/14 0940 01/06/14 1854 01/07/14 0445  HGB 11.1*  --   --   --   --   --   --   --  9.5*  HCT 32.3*  --   --   --   --   --   --   --  27.3*  PLT 300  --   --   --   --   --   --   --  313  APTT  --   --   --  37  --   --  50* 59*  --   HEPARINUNFRC  --   --   < > 0.85*  --   --  0.51 0.36 0.28*  CREATININE 0.82  --   --   --   --  0.82  --   --  0.77  TROPONINI  --  <0.30  --   --  <0.30 <0.30  --   --   --   < > = values in this interval not displayed.  Estimated Creatinine Clearance: 56.5 ml/min (by C-G formula based on Cr of 0.77).     Assessment: 80 y/o M admitted 12/25/13 with AMS and low grade fever with hematuria. MRI brain showed left parietal infarct.  IV heparin was started for history of Afib while Xarelto is on hold.  Heparin level slightly below goal and has been trending down.  Noted documentation of residual hematuria from bladder cancer s/p surgery.   Goal of Therapy:  Heparin level 0.3-0.5 units/mL Monitor platelets by anticoagulation protocol: Yes    Plan:  - Increase heparin gtt to 1050 units/hr - Check 8 hr HL - Daily HL / CBC  - F/U resume Xarelto when able    Shantera Monts D. Mina Marble, PharmD, BCPS Pager:  [phone] 01/07/2014, 11:45 AM

## 2014-01-07 NOTE — Clinical Documentation Improvement (Signed)
Possible Clinical Conditions?  Encephalopathy (describe type if known)                       Anoxic                       Septic                       Alcoholic                        Hepatic                       Hypertensive                       Metabolic                       Toxic Other Condition Cannot Clinically Determine   Supporting Information: (As per notes) Altered mental status;   Possibly from infection. Concerning for new CVA. General: CONFUSED,  Thank You, Delena Serve, BSN, CCDS Clinical Documentation Specialist:  [phone]   Cell=[phone] [place]- Health Information Management

## 2014-01-07 NOTE — Progress Notes (Signed)
Stroke Team Progress Note  HISTORY [name] is an 80 y.o. male a history of atrial fibrillation on Xarelto, hypertension, hyperlipidemia, COPD and recent small left parietal acute stroke12/30/2014, currently an inpatient on the rehabilitation service, who developed altered mental status with decreased speech as well as reduced movement of right extremities earlier this morning. Code stroke was activated. NIH stroke score was 3. CT scan of his head showed no acute intracranial abnormality. Patient was very slow to respond but was oriented to his correct age as well as correct month. He was able to follow commands but was very slow in initiating movements. He tended to gaze to the left side. He also had repetitive movements of his right hand with extension of his right arm. There was a slight drift of his right upper extremity but no weakness of his right lower extremity. He was given 0.5 mg of Ativan with no change in mental status. An EEG was obtained to rule out focal seizure activity. Study showed slight generalized slowing but was otherwise unremarkable. Urinalysis on 01/04/2014 which showed cloudy urine with evidence of blood and few bacteria. Urine culture is pending. Patient was afebrile this morning.  Patient was not a TPA candidate secondary to stroke with in the past 90 days. He was admitted for further evaluation and treatment.  SUBJECTIVE His wife is at the bedside.  Overall he feels his condition is gradually improving.   OBJECTIVE Most recent Vital Signs: Filed Vitals:   01/06/14 0501 01/06/14 1952 01/07/14 0100 01/07/14 0444  BP: 97/57 113/75 109/72 103/71  Pulse: 109 99 90 89  Temp: 97.9 F (36.6 C) 97.6 F (36.4 C) 97.7 F (36.5 C) 97.1 F (36.2 C)  TempSrc: Oral Oral Oral Oral  Resp: 20 20 20 20   Height:      Weight: 58.6 kg (129 lb 3 oz)   58.1 kg (128 lb 1.4 oz)  SpO2: 98% 98% 98% 98%   CBG (last 3)   Recent Labs  01/06/14 2335 01/07/14 0440 01/07/14 0832   GLUCAP 112* 88 92    IV Fluid Intake:   . dextrose 5 % and 0.9 % NaCl with KCl 20 mEq/L 70 mL/hr at 01/06/14 0913  . diltiazem (CARDIZEM) infusion 5 mg/hr (01/07/14 0200)  . heparin 950 Units/hr (01/06/14 1120)    MEDICATIONS  . cefTRIAXone (ROCEPHIN)  IV  1 g Intravenous Q24H  . sodium chloride  3 mL Intravenous Q12H   PRN:  ondansetron (ZOFRAN) IV, ondansetron, RESOURCE THICKENUP CLEAR  Diet:  NPO  Activity:  Up with assistance DVT Prophylaxis:  IV heparin  CLINICALLY SIGNIFICANT STUDIES Basic Metabolic Panel:  Recent Labs Lab 01/06/14 0355 01/07/14 0445  NA 137 136*  K 3.6* 3.8  CL 98 98  CO2 29 27  GLUCOSE 86 90  BUN 17 11  CREATININE 0.82 0.77  CALCIUM 8.3* 8.4  MG  --  1.9   Liver Function Tests:  Recent Labs Lab 01/06/14 0355 01/07/14 0445  AST 20 18  ALT 13 10  ALKPHOS 69 67  BILITOT 0.4 0.2*  PROT 6.1 6.2  ALBUMIN 2.3* 2.3*   CBC:  Recent Labs Lab 01/02/14 0528 01/05/14 1339 01/07/14 0445  WBC 5.8 8.8 4.3  NEUTROABS 3.8 7.4  --   HGB 11.1* 11.1* 9.5*  HCT 33.3* 32.3* 27.3*  MCV 96.5 96.4 94.5  PLT 195 300 313   Coagulation: No results found for this basename: LABPROT, INR,  in the last 168 hours Cardiac Enzymes:  Recent Labs Lab 01/05/14 1820 01/06/14 0036 01/06/14 0355  TROPONINI <0.30 <0.30 <0.30   Urinalysis:  Recent Labs Lab 01/02/14 0555 01/04/14 1014  COLORURINE RED* AMBER*  LABSPEC 1.016 1.023  PHURINE 6.5 6.0  GLUCOSEU NEGATIVE NEGATIVE  HGBUR LARGE* LARGE*  BILIRUBINUR NEGATIVE SMALL*  KETONESUR NEGATIVE 15*  PROTEINUR 30* 100*  UROBILINOGEN 0.2 1.0  NITRITE NEGATIVE NEGATIVE  LEUKOCYTESUR MODERATE* SMALL*   Lipid Panel    Component Value Date/Time   CHOL 107 12/29/2013 0521   TRIG 53 12/29/2013 0521   HDL 49 12/29/2013 0521   CHOLHDL 2.2 12/29/2013 0521   VLDL 11 12/29/2013 0521   LDLCALC 47 12/29/2013 0521   HgbA1C  Lab Results  Component Value Date   HGBA1C 5.8* 12/29/2013    Urine Drug Screen:   No results  found for this basename: labopia, cocainscrnur, labbenz, amphetmu, thcu, labbarb    Alcohol Level: No results found for this basename: ETH,  in the last 168 hours   CT of the brain  01/05/2014    No evidence of acute intracranial abnormality.  Atrophy, chronic small-vessel white matter ischemic changes and remote infarcts.    MRI of the brain  01/06/2014   Newly seen acute infarction within the left thalamus, left occipital lobe and left temporal lobe. Pattern of distribution most likely reflects left PCA territory infarction.    Carotid Doppler  01/06/2014    Chronic COPD/ emphysema  Cardiomegaly with vascular congestion  No definite pneumonia or CHF     Therapy Recommendations   Physical Exam   Elderly Caucasian male not in distress.Awake alert. Afebrile. Head is nontraumatic. Neck is supple without bruit. Hearing is normal. Cardiac exam no murmur or gallop. Lungs are clear to auscultation. Distal pulses are well felt. Neurological Exam ; Awake alert oriented x 2. nonfluent speech with word hesitation. No dysarthria. Diminished recall.decease blink to threat on right.no facial weakness.no drift. diminished fine finger movements on right and right grip weakness. Orbits left over right upper extremity. ASSESSMENT [name] is a 80 y.o. male presenting with difficulty with speech output, inability to use his right upper extremity and facial droop. Imaging confirms a new left PCA infarct - thalamus, occipital and temporal lobe. Infarct felt to be  embolic secondary to known atrial fibrillation. Patient also with likely seizure at onset based on HPI.  On xarelto prior to admission. Now on heparin for secondary stroke prevention as pt unable to swallow. Patient with resultant memory deficit, right hemiparesis. Alien hand syndrome and expressive aphasia.   Atrial Fibrillation, on cardizem drip and IV heparin as NPO  CHA2DS2-VASc Score for Atrial Fibrillation Stroke Risk = 5   Age in Years:  ?72    +2    Sex:  Male   0    Congestive Heart Failure History:  no    Hypertension History:  yes   +1    Stroke/TIA/Thromboembolism History:  yes   +2   Vascular Disease History:  no    Diabetes Mellitus:  noHx stroke  12/25/2013 high left parietal lobe infarct, discharge to rehab following where current stroke occurred  2012 Lacunar stroke   2010 TIA hypertension Hyperlipidemia, LDL 96, on no statin PTA, at goal LDL < 100   Hospital day # 2  TREATMENT/PLAN  Continue IV heparin until able to swallow; once able to swallow, resume xarelto for secondary stroke prevention.  ST following swallow  F/u EEG  Continue therapies  SHARON BIBY, MSN, RN, ANVP-BC, ANP-BC,  GNP-BC [facility] Pager: [phone] 01/07/2014 9:41 AM  I have personally obtained a history, examined the patient, evaluated imaging results, and formulated the assessment and plan of care. I agree with the above. Antony Contras, MD

## 2014-01-07 NOTE — Progress Notes (Signed)
Clinical Social Work Department BRIEF PSYCHOSOCIAL ASSESSMENT 01/07/2014  Patient:  [name], [name]     Account Number:  [id number]     Admit date:  01/05/2014  Clinical Social Worker:  Su Monks  Date/Time:  01/07/2014 11:41 AM  Referred by:  Physician  Date Referred:  01/07/2014 Referred for  SNF Placement   Other Referral:   SNF placement search as backup to CIR   Interview type:  Patient Other interview type:   CSW spoke with wife Ronney Lion and son Lennette Bihari who were present at the bedside.    PSYCHOSOCIAL DATA Living Status:  WIFE Admitted from facility:   Level of care:   Primary support name:  Stepfon Rawles [phone] (H) Primary support relationship to patient:  SPOUSE Degree of support available:   Strong    CURRENT CONCERNS Current Concerns  Post-Acute Placement   Other Concerns:    SOCIAL WORK ASSESSMENT / PLAN CSW met with family to discuss backup options to CIR. CSW introduced self and explained role, patient and family agreeable to speaking. Patient had difficulty speaking but was engaged during assessment. Patient was participating in [place] program when he had Acute CVA. Patient and family are strongly wanting patient to return to [facility] program. CSW validated patient and family's thoughts and explained to them the benefits of having SNF as a backup plan. Patient and family appeared hesitant to agree to SNF but gave permission for CSW to initiate facility search in [place], [place], and [place] wants to know if patient can go to another hospital's CIR program if patient cannot return to [facility] CIR. CSW could not give family an answer on this matter. Wife expressed possible interest in [facility]. CSW thanked patient and family for their time, CSW to follow for discharge needs. Son Lennette Bihari is also involved in decision making. His contact information is [phone]   Assessment/plan status:   Other assessment/ plan:    Information/referral to community resources:   CSW provided patient and family with local SNF listing to review.    PATIENT'S/FAMILY'S RESPONSE TO PLAN OF CARE: Patient and family adamant that patient return to CIR. They do not want patient to go to SNF but would prefer that option compared to returning home in his current condition. Son Lennette Bihari and wife Ronney Lion are concerned that SNF would not provide the high level of health monitoring that would be available in the CIR program, nor the high intensity of physical therapy. CSW offered family support and emphasized that SNF is only a backup option at this point. Patient and family verbalized appreciation for CSW assistance.   Tilden Fossa, MSW, [place] Clinical Social Worker [facility] Emergency [facility]. [phone]

## 2014-01-07 NOTE — Evaluation (Signed)
Occupational Therapy Evaluation Patient Details Name: [name] MRN: [id number] DOB: Apr 07, 1927 Today's Date: 01/07/2014 Time: 6606-3016 OT Time Calculation (min): 18 min  OT Assessment / Plan / Recommendation History of present illness Pt with history of COPD, prior CVA 08/2012 with right sided weakness and dysphagia ( CIR stay) , A fib--Xarelto and bladder cancer with ongoing treatment at [facility]. He was admitted 12/25/2013 with altered mental status and low grade fever and reported hematuria. Cranial CT scan negative and MRI of the brain showed punctate acute infarct in the high left parietal lobe as well as extensive chronic ischemic changes. Patient transferred to CIR on 01/01/14.  On 01/05/14 patient with increased weakness, slurring speech.  Code stroke called and pt returned to acute care for further w/u.  MRI pending.   Clinical Impression   Pt demos decline in function with ADLs and ADL mobility with decreased strength, coordination, balance and endurance. Pt would benefit from acute OT services to address impairments to increase level of function and safety. Recommend pt return to CIR to continue rehab after acute care d/c    OT Assessment  Patient needs continued OT Services    Follow Up Recommendations  CIR    Barriers to Discharge      Equipment Recommendations  None recommended by OT    Recommendations for Other Services Rehab consult  Frequency  Min 2X/week    Precautions / Restrictions Precautions Precautions: Fall Precaution Comments: flu withr recent admission, now resolved Restrictions Weight Bearing Restrictions: No   Pertinent Vitals/Pain Pt states that he has pain, but unclear of location as he is pointing to chest and R UE when asked on 2 separate ocassions    ADL  Grooming: Performed;Wash/dry hands;Wash/dry face;Minimal assistance;Other (comment) (difficulty with R UE initiation/coordination) Upper Body Bathing: Simulated;Moderate assistance Where Assessed -  Upper Body Bathing: Supported sitting Lower Body Bathing: Simulated;Maximal assistance Upper Body Dressing: Performed;Moderate assistance Where Assessed - Upper Body Dressing: Supported sitting Lower Body Dressing: Maximal assistance Toilet Transfer Method: Sit to stand Toileting - Clothing Manipulation and Hygiene: Maximal assistance Transfers/Ambulation Related to ADLs: sit -stand at EOB with mod - min A for balance/support    OT Diagnosis: Generalized weakness;Cognitive deficits  OT Problem List: Decreased strength;Decreased range of motion;Decreased activity tolerance;Impaired balance (sitting and/or standing);Decreased coordination;Decreased cognition;Decreased safety awareness;Decreased knowledge of use of DME or AE;Decreased knowledge of precautions OT Treatment Interventions: Self-care/ADL training;Therapeutic exercise;Energy conservation;DME and/or AE instruction;Therapeutic activities;Cognitive remediation/compensation;Patient/family education;Balance training   OT Goals(Current goals can be found in the care plan section) Acute Rehab OT Goals Patient Stated Goal: Family:  get him back to CIR OT Goal Formulation: With patient/family Time For Goal Achievement: 01/14/14 Potential to Achieve Goals: Good ADL Goals Pt Will Perform Upper Body Bathing: with min assist;sitting (with min multimodal cues for R UE initiation) Pt Will Perform Lower Body Bathing: with mod assist;sitting/lateral leans;sit to/from stand (with min multimodal cues for R UE initiation) Pt Will Perform Upper Body Dressing: with min assist;sitting (with min multimodal cues for R UE initiation) Pt Will Perform Lower Body Dressing: with mod assist;sitting/lateral leans;sit to/from stand (with min multimodal cues for R UE initiation) Pt Will Transfer to Toilet: with mod assist;with min assist;bedside commode Additional ADL Goal #1: Pt will participate in R UE attention/coordination exercises with min multimodal cues  sitting EOB  Visit Information  Last OT Received On: 01/07/14 Assistance Needed: +2 PT/OT/SLP Co-Evaluation/Treatment: Yes Reason for Co-Treatment: Complexity of the patient's impairments (multi-system involvement) PT goals addressed during session: Mobility/safety  with mobility OT goals addressed during session: ADL's and self-care History of Present Illness: Pt with history of COPD, prior CVA 08/2012 with right sided weakness and dysphagia ( CIR stay) , A fib--Xarelto and bladder cancer with ongoing treatment at [facility]. He was admitted 12/25/2013 with altered mental status and low grade fever and reported hematuria. Cranial CT scan negative and MRI of the brain showed punctate acute infarct in the high left parietal lobe as well as extensive chronic ischemic changes. Patient transferred to CIR on 01/01/14.  On 01/05/14 patient with increased weakness, slurring speech.  Code stroke called and pt returned to acute care for further w/u.  MRI pending.       Prior [place] expects to be discharged to:: Inpatient rehab Living Arrangements: Spouse/significant other Available Help at Discharge: Available 24 hours/day Type of Home: House Home Access: Stairs to enter [employer] of Steps: 4 Entrance Stairs-Rails: Right Home Layout: One level Home Equipment: Shower seat;Hand held shower head  Lives With: Spouse Prior Function Level of Independence: Independent Comments: wife assists with setting out clothes. Pt has bladder incontinence Communication Communication: Expressive difficulties Dominant Hand: Right         Vision/Perception Vision - History Baseline Vision: Wears glasses all the time Visual History: Cataracts Patient Visual Report: No change from baseline Perception Perception: Impaired Spatial Orientation: noted overshooting when reaching for objects   Cognition  Cognition Arousal/Alertness: Awake/alert Behavior During  Therapy: WFL for tasks assessed/performed Overall Cognitive Status: Difficult to assess Area of Impairment: Memory;Awareness;Problem solving Orientation Level: Disoriented to;Situation Current Attention Level: Sustained Memory: Decreased short-term memory Following Commands: Follows multi-step commands inconsistently Safety/Judgement: Decreased awareness of deficits;Decreased awareness of safety Awareness: Emergent Problem Solving: Slow processing;Difficulty sequencing;Requires verbal cues;Requires tactile cues General Comments: pt is much more alert and oriented today, better command following and improved awareness and attention.  Difficult to assess due to: Impaired communication    Extremity/Trunk Assessment Upper Extremity Assessment Upper Extremity Assessment: Overall WFL for tasks assessed;Generalized weakness;RUE deficits/detail RUE Deficits / Details: generalized weakness, decreased coordination RUE Coordination: decreased gross motor;decreased fine motor Lower Extremity Assessment Lower Extremity Assessment: Defer to PT evaluation Cervical / Trunk Assessment Cervical / Trunk Assessment: Kyphotic     Mobility Bed Mobility Overal bed mobility: Needs Assistance Bed Mobility: Supine to Sit;Sit to Supine Rolling: Mod assist Supine to sit: Min assist Sit to supine: Mod assist Transfers Overall transfer level: Needs assistance Transfers: Sit to/from Stand Sit to Stand: +2 physical assistance;Mod assist;Min assist General transfer comment: sit -stand at EOB with mod - min A for balance/support     Exercise     Balance Balance Overall balance assessment: Needs assistance;History of Falls Sitting-balance support: Single extremity supported;Bilateral upper extremity supported;Feet supported Sitting balance-Leahy Scale: Poor Dynamic Sitting - Comments: Sat EOB needing min guard to min assist if challenged.  Pt with poor coordination of entire right hemibody with some ?  inattention to right side.   Postural control: Posterior lean Standing balance support: Bilateral upper extremity supported Standing balance-Leahy Scale: Poor Standing balance comment: Stood up to 1 min with bil HHA with incr flexion at hips, knees and ankles.     End of Session OT - End of Session Equipment Utilized During Treatment: Gait belt Activity Tolerance: Patient tolerated treatment well Patient left: in chair;with call bell/phone within reach  GO     Britt Bottom 01/07/2014, 2:49 PM

## 2014-01-07 NOTE — Progress Notes (Signed)
[facility] PROGRESS NOTE  [name] YHC:[id number] DOB: Mar 10, 1927 DOA: 01/05/2014 PCP: Cathlean Cower, MD  Assessment/Plan: Altered mental status, improved; Pt had an acute CVA   Acute CVA Newly seen acute infarction within the left thalamus, left occipital lobe and left temporal lobe. Pattern of distribution most likely reflects left PCA territory infarction.  Neurology following. Pt currently on IV heparin. SLP consult pending. Consulted Inpatient rehab for evaluation.  Stroke team input pending.   Atrial fibrillation with RVR:  Admitted to telemetry and started on IV cardizem. Rate currently controlled. Consulted cardiology for recommendations, Pt's cardiologist is Dr. Mare Ferrari. Currently on heparin drip. Resume digoxin  Serial troponins negative for acute myocardial injury.    UTI:  Started on rocephin and urine cultures pending.   Dysphagia:  Currently NPO, will get SLP eval - (pending)   Hypokalemia - repleted, following  Code Status: full code  Family Communication: wife at bedside  Disposition Plan: admit to telemetry.   HPI/Subjective: Pt's speech is slightly improved, No complaints today  Objective: Filed Vitals:   01/07/14 0444  BP: 103/71  Pulse: 89  Temp: 97.1 F (36.2 C)  Resp: 20    Intake/Output Summary (Last 24 hours) at 01/07/14 0907 Last data filed at 01/07/14 0835  Gross per 24 hour  Intake 1828.27 ml  Output   1750 ml  Net  78.27 ml   Filed Weights   01/06/14 0501 01/07/14 0444  Weight: 129 lb 3 oz (58.6 kg) 128 lb 1.4 oz (58.1 kg)    Exam: General: appears comfortable, no apparent distress, awake, alert Eyes: PERRL, normal lids, irises & conjunctiva  ENT: grossly normal hearing, lips & tongue  Neck: no LAD, masses or thyromegaly  Cardiovascular: irregular, RR, no m/r/g. No LE edema.  Respiratory: CTA bilaterally. Normal respiratory effort.  Abdomen: soft, BS+ No distention.  Skin: no rash or induration seen on limited exam   Musculoskeletal: grossly normal tone BUE/BLE  Neurologic: right facial droop, able to move all extremities, speech slightly improved  Data Reviewed: Basic Metabolic Panel:  Recent Labs Lab 01/02/14 0528 01/05/14 1339 01/06/14 0355 01/07/14 0445  NA 139 138 137 136*  K 4.1 4.6 3.6* 3.8  CL 102 100 98 98  CO2 27 27 29 27   GLUCOSE 110* 101* 86 90  BUN 23 24* 17 11  CREATININE 0.90 0.82 0.82 0.77  CALCIUM 8.5 9.0 8.3* 8.4  MG  --   --   --  1.9   Liver Function Tests:  Recent Labs Lab 01/02/14 0528 01/06/14 0355 01/07/14 0445  AST 21 20 18   ALT 14 13 10   ALKPHOS 75 69 67  BILITOT 0.5 0.4 0.2*  PROT 6.3 6.1 6.2  ALBUMIN 2.5* 2.3* 2.3*   No results found for this basename: LIPASE, AMYLASE,  in the last 168 hours No results found for this basename: AMMONIA,  in the last 168 hours CBC:  Recent Labs Lab 01/02/14 0528 01/05/14 1339 01/07/14 0445  WBC 5.8 8.8 4.3  NEUTROABS 3.8 7.4  --   HGB 11.1* 11.1* 9.5*  HCT 33.3* 32.3* 27.3*  MCV 96.5 96.4 94.5  PLT 195 300 313   Cardiac Enzymes:  Recent Labs Lab 01/05/14 1820 01/06/14 0036 01/06/14 0355  TROPONINI <0.30 <0.30 <0.30   BNP (last 3 results) No results found for this basename: PROBNP,  in the last 8760 hours CBG:  Recent Labs Lab 01/06/14 1626 01/06/14 1931 01/06/14 2335 01/07/14 0440 01/07/14 0832  GLUCAP 99 112* 112* 88  92    Recent Results (from the past 240 hour(s))  URINE CULTURE     Status: None   Collection Time    01/02/14  5:55 AM      Result Value Range Status   Specimen Description URINE, CLEAN CATCH   Final   Special Requests NONE   Final   Culture  Setup Time     Final   Value: 01/02/2014 11:02     Performed at [place]     Final   Value: >=100,000 COLONIES/ML     Performed at [facility]   Culture     Final   Value: Multiple bacterial morphotypes present, none predominant. Suggest appropriate recollection if clinically indicated.      Performed at [facility]   Report Status 01/03/2014 FINAL   Final  URINE CULTURE     Status: None   Collection Time    01/04/14 10:14 AM      Result Value Range Status   Specimen Description URINE, CATHETERIZED   Final   Special Requests NONE   Final   Culture  Setup Time     Final   Value: 01/04/2014 11:20     Performed at [place]     Final   Value: >=100,000 COLONIES/ML     Performed at [facility]   Culture     Final   Value: STAPHYLOCOCCUS SPECIES (COAGULASE NEGATIVE)     Note: RIFAMPIN AND GENTAMICIN SHOULD NOT BE USED AS SINGLE DRUGS FOR TREATMENT OF STAPH INFECTIONS.     Performed at [facility]   Report Status PENDING   Incomplete     Studies: Dg Chest 2 View  01/06/2014   CLINICAL DATA:  Acute stroke  EXAM: CHEST  2 VIEW  COMPARISON:  12/25/2013  FINDINGS: Background COPD/ emphysema noted with mild hyperinflation and parenchymal scarring. Heart is enlarged with vascular congestion. No definite CHF, effusion or pneumonia. Skin folds overlie the mid chest regions bilaterally. Chronic apical scarring. No pneumothorax. Trachea is midline. Atherosclerosis of the aorta.  IMPRESSION: Chronic COPD/ emphysema  Cardiomegaly with vascular congestion  No definite pneumonia or CHF   Electronically Signed   By: Daryll Brod M.D.   On: 01/06/2014 12:53   Ct Head Wo Contrast  01/05/2014   CLINICAL DATA:  80 year old male with right upper extremity weakness and aphasia. Code stroke.  EXAM: CT HEAD WITHOUT CONTRAST  TECHNIQUE: Contiguous axial images were obtained from the base of the skull through the vertex without intravenous contrast.  COMPARISON:  12/27/2013 CT and MRI and prior examinations.  FINDINGS: Moderate chronic small-vessel white matter ischemic changes, generalized cerebral volume loss and remote lacunar infarcts within the deep white matter, basal ganglia and thalami again noted.  No acute intracranial abnormalities are  identified, including mass lesion or mass effect, hydrocephalus, extra-axial fluid collection, midline shift, hemorrhage, or acute infarction. The visualized bony calvarium is unremarkable.  IMPRESSION: No evidence of acute intracranial abnormality.  Atrophy, chronic small-vessel white matter ischemic changes and remote infarcts.  Critical Value/emergent results were called by telephone at the time of interpretation on 01/05/2014 at 12:08 PM to Dr. Wallie Char , who verbally acknowledged these results.   Electronically Signed   By: Hassan Rowan M.D.   On: 01/05/2014 12:08   Mr Brain Wo Contrast  01/06/2014   CLINICAL DATA:  Mental status changes.  Right arm weakness.  EXAM: MRI  HEAD WITHOUT CONTRAST  TECHNIQUE: Multiplanar, multiecho pulse sequences of the brain and surrounding structures were obtained without intravenous contrast.  COMPARISON:  Head CT 01/05/2014.  MRI 12/27/2013.  FINDINGS: Diffusion imaging shows new acute infarction in the left occipital lobe, left thalamus and left temporal lobe most consistent with PCA territory infarction. Areas of involvement show mild swelling but there is no hemorrhage or mass effect.  There chronic small vessel changes throughout the pons. There are old small vessel cerebellar infarctions. There are old lacunar infarctions within the basal ganglia and there are chronic small vessel changes throughout the cerebral hemispheric white matter. No mass lesion. No hydrocephalus or extra-axial collection. Major vessels at the base of the brain show flow.  IMPRESSION: Newly seen acute infarction within the left thalamus, left occipital lobe and left temporal lobe. Pattern of distribution most likely reflects left PCA territory infarction.   Electronically Signed   By: Nelson Chimes M.D.   On: 01/06/2014 13:37    Scheduled Meds: . cefTRIAXone (ROCEPHIN)  IV  1 g Intravenous Q24H  . sodium chloride  3 mL Intravenous Q12H   Continuous Infusions: . dextrose 5 % and 0.9 % NaCl  with KCl 20 mEq/L 70 mL/hr at 01/06/14 0913  . diltiazem (CARDIZEM) infusion 5 mg/hr (01/07/14 0200)  . heparin 950 Units/hr (01/06/14 1120)    Active Problems:   Altered mental status   [place] [facility] Pager [phone]. If 7PM-7AM, please contact night-coverage at [email or web address], password [facility] 01/07/2014, 9:07 AM  LOS: 2 days

## 2014-01-07 NOTE — Progress Notes (Signed)
Noted pt returned to acute on 01/05/14. Patient admitted to ip rehab on 01/01/14. We will follow his progress to assist in return to ip rehab as appropriate. [phone].

## 2014-01-07 NOTE — Procedures (Signed)
ELECTROENCEPHALOGRAM REPORT   Patient: [name]       Room #: 3O12 EEG No. ID: [id number] Age: 80 y.o.        Sex: male Referring Physician: Wynetta Emery, C. Report Date:  01/07/2014        Interpreting Physician: Anthony Sar  History: [name] is an 80 y.o. male with recent left parietal stroke who developed acute onset of speech difficulty and reduced movements involving right side as well as continuous movements of right hand with stiffness of right upper extremity.  Indications for study: Rule out new onset focal seizure disorder.   Technique: This is an 18 channel routine scalp EEG performed at the bedside with bipolar and monopolar montages arranged in accordance to the international 10/20 system of electrode placement.   Description: This EEG recording was performed during wakefulness. Background activity consisted of low amplitude diffuse symmetrical mixed irregular delta and theta activity. 8-9 Hz alpha rhythm was recorded from the posterior head regions. Photic stimulation was not performed. Hyperventilation was not performed. No epileptiform discharges were recorded.  Interpretation: This EEG is abnormal with mild generalized nonspecific continuous slowing of cerebral activity. No evidence of an epileptic disorder was demonstrated.   Rush Farmer M.D. [facility] [phone]

## 2014-01-07 NOTE — Progress Notes (Signed)
SPEECH PATHOLOGY  Chart reviewed and in light of patient's recent history of CVA with dysphagia, which included silent aspiration, will defer bedside swallow evaluation and proceed with MBS.  Discussed with pt's wife and with stroke MD who agree. MBS scheduled for today at 11:00.  Quinn Axe T CCC/SLP

## 2014-01-07 NOTE — Procedures (Signed)
Objective Swallowing Evaluation: Modified Barium Swallowing Study  Patient Details  Name: Jamill Wetmore MRN: [id number] Date of Birth: 1927/11/02  Today's Date: 01/07/2014 Time: 4166-0630 SLP Time Calculation (min): 40 min  Past Medical History:  Past Medical History  Diagnosis Date  . NEOPLASM, MALIGNANT, BLADDER 04/10/2009  . HYPERLIPIDEMIA 04/10/2009  . ANEMIA-NOS 04/10/2009  . 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 ANXIETY 04/10/2009  . HYPERTENSION 04/10/2009  . Atrial fibrillation 04/10/2009  . COPD 04/10/2009  . TRANSIENT ISCHEMIC ATTACK, HX OF 04/10/2009  . Bladder cancer     Dr. Lawerance Bach  . Lacunar stroke 04/07/2011  . Pleural effusion 04/07/2011  . 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 Stroke    Past Surgical History:  Past Surgical History  Procedure Laterality Date  . Splenectomy    . Bilateral vats ablation    . Facial cancer      facial skin cancer   HPI:  80 y.o. male with hx of bladder cancer, undergoing new treatment at outside facility, hx of afib on Xarelto (Dr Mare Ferrari), prior CVA, HTN, hyperlipidemia, s/p splenectomy.  Admitted with  acute encephalopathy, likely secondary to influenza and questionable UTI; SIRS, hematuria.  Has been on dysphagia 3 diet with crushed meds since admission - now with difficulty swallowing. MRI: Newly seen acute infarction within the left thalamus, left occipital      Assessment / Plan / Recommendation Clinical Impression  Dysphagia Diagnosis: Moderate oral phase dysphagia;Moderate pharyngeal phase dysphagia Clinical impression: Pt. exhibits a moderate oropharyngeal dysphagia as evidenced by decreased lingual strength and coordination for bolus propulsion, decreased base of tongue contraction to the pharyngeal wall, decreased anterior movement of the hyoid, and decreased laryngeal elevation, resulting in positive aspiration of nectar thick liquids with a delayed and ineffective cough response, and moderate vallecular residue after the  swallow.  Pt. was able to clear most residue with moderate verbal cues to "swallow again."    Treatment Recommendation  Therapy as outlined in treatment plan below    Diet Recommendation Dysphagia 1 (Puree);Honey-thick liquid   Liquid Administration via: Cup;No straw Medication Administration: Whole meds with puree Supervision: Full supervision/cueing for compensatory strategies Compensations: Slow rate;Small sips/bites Postural Changes and/or Swallow Maneuvers: Seated upright 90 degrees    Other  Recommendations Oral Care Recommendations: Oral care BID Other Recommendations: Order thickener from pharmacy;Clarify dietary restrictions;Have oral suction available   Follow Up Recommendations  Inpatient Rehab    Frequency and Duration min 2x/week  2 weeks   Pertinent Vitals/Pain Afebrile; LS diminished.    SLP Swallow Goals     General Date of Onset: 12/25/13 HPI: 80 y.o. male with hx of bladder cancer, undergoing new treatment at outside facility, hx of afib on Xarelto (Dr Mare Ferrari), prior CVA, HTN, hyperlipidemia, s/p splenectomy.  Admitted with  acute encephalopathy, likely secondary to influenza and questionable UTI; SIRS, hematuria.  Has been on dysphagia 3 diet with crushed meds since admission - now with difficulty swallowing. MRI: Newly seen acute infarction within the left thalamus, left occipital  Type of Study: Modified Barium Swallowing Study Reason for Referral: Objectively evaluate swallowing function Previous Swallow Assessment: 08/18/12 MBS Clinical impression: Mr. Yi presents with a mild oral dyspahgia with mild oral residual due to left labial and lingual weakness. More signficant oropharyngeal dysphagia with sensory/motor deficits leading to a delay in swallow initiaton and silent aspriaiton of thin liquids before the swallow. Furthermore weakness of base of tongue and hyolaryngeal complex results in mild to moderate residual that necesitates verbal cues for a  second swallow to clear. Pt is a moderate aspiration risk Diet Prior to this Study: NPO Temperature Spikes Noted: No Respiratory Status: Room air History of Recent Intubation: No Behavior/Cognition: Alert;Cooperative;Requires cueing;Decreased sustained attention;Distractible Oral Cavity - Dentition: Adequate natural dentition Oral Motor / Sensory Function: Impaired motor Oral impairment: Right lingual;Right labial Self-Feeding Abilities: Needs assist Patient Positioning: Upright in chair Baseline Vocal Quality: Low vocal intensity Volitional Cough: Weak;Wet;Congested Volitional Swallow: Unable to elicit Anatomy: Within functional limits Pharyngeal Secretions: Not observed secondary MBS    Reason for Referral Objectively evaluate swallowing function   Oral Phase Oral Preparation/Oral Phase Oral Phase: Impaired Oral - Solids Oral - Puree: Weak lingual manipulation;Lingual pumping;Incomplete tongue to palate contact;Reduced posterior propulsion;Holding of bolus;Delayed oral transit Oral - Pill: Weak lingual manipulation;Lingual pumping;Reduced posterior propulsion;Holding of bolus;Delayed oral transit   Pharyngeal Phase Pharyngeal Phase Pharyngeal Phase: Impaired Pharyngeal - Honey Pharyngeal - Honey Cup: Delayed swallow initiation;Premature spillage to valleculae;Reduced anterior laryngeal mobility;Reduced laryngeal elevation;Reduced tongue base retraction;Pharyngeal residue - valleculae Pharyngeal - Nectar Pharyngeal - Nectar Teaspoon: Delayed swallow initiation;Reduced epiglottic inversion;Reduced anterior laryngeal mobility;Reduced laryngeal elevation;Reduced airway/laryngeal closure;Reduced tongue base retraction;Penetration/Aspiration during swallow;Moderate aspiration;Pharyngeal residue - valleculae Penetration/Aspiration details (nectar teaspoon): Material enters airway, passes BELOW cords and not ejected out despite cough attempt by patient Pharyngeal - Solids Pharyngeal -  Puree: Delayed swallow initiation;Premature spillage to valleculae;Reduced anterior laryngeal mobility;Reduced laryngeal elevation;Reduced tongue base retraction;Pharyngeal residue - valleculae;Compensatory strategies attempted (Comment) (Most clears with repeat dry swallow)  Cervical Esophageal Phase    GO    Cervical Esophageal Phase Cervical Esophageal Phase: Silverio Lay T 01/07/2014, 11:56 AM

## 2014-01-07 NOTE — Progress Notes (Signed)
Physical Therapy Treatment Patient Details Name: [name] MRN: [id number] DOB: 05/03/1927 Today's Date: 01/07/2014 Time: 4268-3419 PT Time Calculation (min): 16 min  PT Assessment / Plan / Recommendation  History of Present Illness Pt with history of COPD, prior CVA 08/2012 with right sided weakness and dysphagia ( CIR stay) , A fib--Xarelto and bladder cancer with ongoing treatment at [facility]. He was admitted 12/25/2013 with altered mental status and low grade fever and reported hematuria. Cranial CT scan negative and MRI of the brain showed punctate acute infarct in the high left parietal lobe as well as extensive chronic ischemic changes. Patient transferred to CIR on 01/01/14.  On 01/05/14 patient with increased weakness, slurring speech.  Code stroke called and pt returned to acute care for further w/u.  MRI pending.   PT Comments   Pt admitted with above. Pt currently with functional limitations due to continued balance and endurance deficits. Pt will benefit from skilled PT to increase their independence and safety with mobility to allow discharge to the venue listed below.   Follow Up Recommendations  CIR                 Equipment Recommendations  Other (comment) (TBA)    Recommendations for Other Services Rehab consult  Frequency Min 4X/week   Progress towards PT Goals Progress towards PT goals: Progressing toward goals  Plan Current plan remains appropriate    Precautions / Restrictions Precautions Precautions: Fall Precaution Comments: flu withr recent admission, now resolved Restrictions Weight Bearing Restrictions: No   Pertinent Vitals/Pain VSS, no pain    Mobility  Bed Mobility Overal bed mobility: Needs Assistance Bed Mobility: Supine to Sit;Sit to Supine Rolling: Mod assist Supine to sit: Min assist Sit to supine: Mod assist Transfers Overall transfer level: Needs assistance Transfers: Sit to/from Stand Sit to Stand: +2 physical assistance;Mod assist;Min  assist General transfer comment: sit to stand at EOB with varying mod to min assist.          PT Goals (current goals can now be found in the care plan section)    Visit Information  Last PT Received On: 01/07/14 Assistance Needed: +2 PT/OT/SLP Co-Evaluation/Treatment: Yes Reason for Co-Treatment: Complexity of the patient's impairments (multi-system involvement) PT goals addressed during session: Mobility/safety with mobility OT goals addressed during session: ADL's and self-care History of Present Illness: Pt with history of COPD, prior CVA 08/2012 with right sided weakness and dysphagia ( CIR stay) , A fib--Xarelto and bladder cancer with ongoing treatment at [facility]. He was admitted 12/25/2013 with altered mental status and low grade fever and reported hematuria. Cranial CT scan negative and MRI of the brain showed punctate acute infarct in the high left parietal lobe as well as extensive chronic ischemic changes. Patient transferred to CIR on 01/01/14.  On 01/05/14 patient with increased weakness, slurring speech.  Code stroke called and pt returned to acute care for further w/u.  MRI pending.    Subjective Data  Subjective: "I am tired."   Cognition  Cognition Arousal/Alertness: Awake/alert Behavior During Therapy: WFL for tasks assessed/performed Overall Cognitive Status: Difficult to assess Area of Impairment: Memory;Awareness;Problem solving Orientation Level: Disoriented to;Situation Current Attention Level: Sustained Memory: Decreased short-term memory Following Commands: Follows multi-step commands inconsistently Safety/Judgement: Decreased awareness of deficits;Decreased awareness of safety Awareness: Emergent Problem Solving: Slow processing;Difficulty sequencing;Requires verbal cues;Requires tactile cues General Comments: pt is much more alert and oriented today, better command following and improved awareness and attention.  Difficult to assess due to: Impaired  communication    Balance  Balance Overall balance assessment: Needs assistance;History of Falls Sitting-balance support: Bilateral upper extremity supported;Feet supported Sitting balance-Leahy Scale: Poor Dynamic Sitting - Comments: Sat EOB needing min guard to min assist if challenged.  Pt with poor coordination of entire right hemibody with some ? inattention to right side.   Postural control: Posterior lean Standing balance support: Bilateral upper extremity supported;During functional activity Standing balance-Leahy Scale: Poor Standing balance comment: Stood up to 1 min with bil HHA with incr flexion at hips, knees and ankles.    End of Session PT - End of Session Equipment Utilized During Treatment: Gait belt Activity Tolerance: Patient limited by fatigue Patient left: in bed;with call bell/phone within reach;with family/visitor present Nurse Communication: Mobility status        INGOLD,Dillon Livermore 01/07/2014, 2:28 PM [facility] [phone] (pager)

## 2014-01-08 DIAGNOSIS — R4182 Altered mental status, unspecified: Secondary | ICD-10-CM

## 2014-01-08 DIAGNOSIS — I6350 Cerebral infarction due to unspecified occlusion or stenosis of unspecified cerebral artery: Secondary | ICD-10-CM

## 2014-01-08 LAB — CBC
HCT: 28.4 % — ABNORMAL LOW (ref 39.0–52.0)
Hemoglobin: 9.8 g/dL — ABNORMAL LOW (ref 13.0–17.0)
MCH: 32.8 pg (ref 26.0–34.0)
MCHC: 34.5 g/dL (ref 30.0–36.0)
MCV: 95 fL (ref 78.0–100.0)
PLATELETS: 311 10*3/uL (ref 150–400)
RBC: 2.99 MIL/uL — ABNORMAL LOW (ref 4.22–5.81)
RDW: 14 % (ref 11.5–15.5)
WBC: 5.4 10*3/uL (ref 4.0–10.5)

## 2014-01-08 LAB — GLUCOSE, CAPILLARY
GLUCOSE-CAPILLARY: 116 mg/dL — AB (ref 70–99)
GLUCOSE-CAPILLARY: 125 mg/dL — AB (ref 70–99)

## 2014-01-08 LAB — HEPARIN LEVEL (UNFRACTIONATED): HEPARIN UNFRACTIONATED: 0.3 [IU]/mL (ref 0.30–0.70)

## 2014-01-08 MED ORDER — RIVAROXABAN 20 MG PO TABS
20.0000 mg | ORAL_TABLET | Freq: Every day | ORAL | Status: DC
  Administered 2014-01-08 – 2014-01-09 (×2): 20 mg via ORAL
  Filled 2014-01-08 (×2): qty 1

## 2014-01-08 MED ORDER — HEPARIN (PORCINE) IN NACL 100-0.45 UNIT/ML-% IJ SOLN
1100.0000 [IU]/h | INTRAMUSCULAR | Status: AC
  Administered 2014-01-08: 10:00:00 1100 [IU]/h via INTRAVENOUS

## 2014-01-08 MED ORDER — NITROFURANTOIN MONOHYD MACRO 100 MG PO CAPS: 100.0000 mg | ORAL_CAPSULE | Freq: Two times a day (BID) | ORAL | Status: DC

## 2014-01-08 NOTE — Progress Notes (Signed)
I will initiate  Insurance authorization to readmit pt to inpt rehab with [facility]. Likely admit tomorrow. [phone] Discussed with RNCM.

## 2014-01-08 NOTE — Care Management Note (Addendum)
  Page 1 of 1   01/09/2014     1:57:23 PM   CARE MANAGEMENT NOTE 01/09/2014  Patient:  [name], [name]   Account Number:  [id number]  Date Initiated:  01/08/2014  Documentation initiated by:  Mariann Laster  Subjective/Objective Assessment:   admitted with AMS and CVA  Hx/o CVA 01/25/2014 and d/c to CIR, however readmission d/t another CVA.     Action/Plan:   CM will monitor for disposition needs.   Anticipated DC Date:  01/09/2014   Anticipated DC Plan:  IP REHAB FACILITY         Choice offered to / List presented to:             Status of service:  Completed, signed off Medicare Important Message given?   (If response is "NO", the following Medicare IM given date fields will be blank) Date Medicare IM given:   Date Additional Medicare IM given:    Discharge Disposition:    Per UR Regulation:  Reviewed for med. necessity/level of care/duration of stay  If discussed at [place] of Stay Meetings, dates discussed:    Comments:  01/09/2014 CIR confirms auth received for CIR. Disposition to CIR today 01/09/2014 [place], BSN, [facility], CCM 01/09/2014  Hx/o CVA 01/25/2014 and d/c to CIR.  Patient had another CVA and readmitted Disposition:  Admitt back to CIR. CIR aware and working on authorization.  Possible TF today 01/09/2013 if authorization obtained. Harout Scheurich RN, BSN, [place], [facility] 01/09/2014  01/08/2014 Disposition Plan:  CIR pending new authorization for readmission back to CIR. Hussain Maimone RN, BSN, [place], [place] 01/08/2014

## 2014-01-08 NOTE — Progress Notes (Signed)
   SUBJECTIVE:  He is more alert.  Indicates no pain.  He is nonverbal   PHYSICAL EXAM Filed Vitals:   01/07/14 2327 01/07/14 2356 01/08/14 0514 01/08/14 1210  BP: 119/100 140/80 122/75 151/53  Pulse:   95 85  Temp:   98.2 F (36.8 C) 97.6 F (36.4 C)  TempSrc:   Oral Oral  Resp:   18 18  Height:      Weight:   128 lb 1.4 oz (58.1 kg)   SpO2:   97% 100%   General:  Frail but no distress Lungs:  Clear Heart:  Irregular Abdomen:  Positive bowel sounds, no rebound no guarding Extremities:  No edema  LABS:  Results for orders placed during the hospital encounter of 01/05/14 (from the past 24 hour(s))  GLUCOSE, CAPILLARY     Status: Abnormal   Collection Time    01/07/14  4:31 PM      Result Value Range   Glucose-Capillary 102 (*) 70 - 99 mg/dL  HEPARIN LEVEL (UNFRACTIONATED)     Status: None   Collection Time    01/07/14  8:35 PM      Result Value Range   Heparin Unfractionated 0.31  0.30 - 0.70 IU/mL  HEPARIN LEVEL (UNFRACTIONATED)     Status: None   Collection Time    01/08/14  4:10 AM      Result Value Range   Heparin Unfractionated 0.30  0.30 - 0.70 IU/mL  CBC     Status: Abnormal   Collection Time    01/08/14  4:10 AM      Result Value Range   WBC 5.4  4.0 - 10.5 K/uL   RBC 2.99 (*) 4.22 - 5.81 MIL/uL   Hemoglobin 9.8 (*) 13.0 - 17.0 g/dL   HCT 28.4 (*) 39.0 - 52.0 %   MCV 95.0  78.0 - 100.0 fL   MCH 32.8  26.0 - 34.0 pg   MCHC 34.5  30.0 - 36.0 g/dL   RDW 14.0  11.5 - 15.5 %   Platelets 311  150 - 400 K/uL  GLUCOSE, CAPILLARY     Status: Abnormal   Collection Time    01/08/14 11:01 AM      Result Value Range   Glucose-Capillary 116 (*) 70 - 99 mg/dL    Intake/Output Summary (Last 24 hours) at 01/08/14 1305 Last data filed at 01/08/14 1029  Gross per 24 hour  Intake 1840.65 ml  Output    250 ml  Net 1590.65 ml      ASSESSMENT AND PLAN:  CVA:  Creat clearance is 56.4.   He would qualify for the 20 mg dose of Xarelto.  However, the failure of  this med could be because it was held for a short while for hematuria.  At this point I think that it is most prudent to continue the 15 mg dose.   ATRIAL FIB:  Now on PO Cardizem.  Can consolidate to XL before discharge.    Jeneen Rinks [place] Duquette 01/08/2014 1:05 PM

## 2014-01-08 NOTE — Progress Notes (Signed)
Stroke Team Progress Note  HISTORY [name] is an 80 y.o. male a history of atrial fibrillation on Xarelto, hypertension, hyperlipidemia, COPD and recent small left parietal acute stroke12/30/2014, currently an inpatient on the rehabilitation service, who developed altered mental status with decreased speech as well as reduced movement of right extremities earlier this morning. Code stroke was activated. NIH stroke score was 3. CT scan of his head showed no acute intracranial abnormality. Patient was very slow to respond but was oriented to his correct age as well as correct month. He was able to follow commands but was very slow in initiating movements. He tended to gaze to the left side. He also had repetitive movements of his right hand with extension of his right arm. There was a slight drift of his right upper extremity but no weakness of his right lower extremity. He was given 0.5 mg of Ativan with no change in mental status. An EEG was obtained to rule out focal seizure activity. Study showed slight generalized slowing but was otherwise unremarkable. Urinalysis on 01/04/2014 which showed cloudy urine with evidence of blood and few bacteria. Urine culture is pending. Patient was afebrile this morning.  Patient was not a TPA candidate secondary to stroke with in the past 90 days. He was admitted for further evaluation and treatment.  SUBJECTIVE His wife is at the bedside.  She states he ate all his supper and breakfast - she feels he is doing well.  OBJECTIVE Most recent Vital Signs: Filed Vitals:   01/07/14 2306 01/07/14 2327 01/07/14 2356 01/08/14 0514  BP:  119/100 140/80 122/75  Pulse: 88   95  Temp:    98.2 F (36.8 C)  TempSrc:    Oral  Resp:    18  Height:      Weight:    58.1 kg (128 lb 1.4 oz)  SpO2:    97%   CBG (last 3)   Recent Labs  01/07/14 0832 01/07/14 1201 01/07/14 1631  GLUCAP 92 102* 102*    IV Fluid Intake:   . dextrose 5 % and 0.9 % NaCl with KCl 20  mEq/L 70 mL/hr at 01/08/14 0738  . heparin      MEDICATIONS  . cefTRIAXone (ROCEPHIN)  IV  1 g Intravenous Q24H  . diltiazem  60 mg Oral Q6H  . sodium chloride  3 mL Intravenous Q12H   PRN:  ondansetron (ZOFRAN) IV, ondansetron, RESOURCE THICKENUP CLEAR, RESOURCE THICKENUP CLEAR  Diet:  Dysphagia 1 honey thick liquids Activity:  Up with assistance DVT Prophylaxis:  IV heparin  CLINICALLY SIGNIFICANT STUDIES Basic Metabolic Panel:   Recent Labs Lab 01/06/14 0355 01/07/14 0445  NA 137 136*  K 3.6* 3.8  CL 98 98  CO2 29 27  GLUCOSE 86 90  BUN 17 11  CREATININE 0.82 0.77  CALCIUM 8.3* 8.4  MG  --  1.9   Liver Function Tests:   Recent Labs Lab 01/06/14 0355 01/07/14 0445  AST 20 18  ALT 13 10  ALKPHOS 69 67  BILITOT 0.4 0.2*  PROT 6.1 6.2  ALBUMIN 2.3* 2.3*   CBC:   Recent Labs Lab 01/02/14 0528 01/05/14 1339 01/07/14 0445 01/08/14 0410  WBC 5.8 8.8 4.3 5.4  NEUTROABS 3.8 7.4  --   --   HGB 11.1* 11.1* 9.5* 9.8*  HCT 33.3* 32.3* 27.3* 28.4*  MCV 96.5 96.4 94.5 95.0  PLT 195 300 313 311   Coagulation: No results found for this basename: LABPROT, INR,  in the last 168 hours Cardiac Enzymes:   Recent Labs Lab 01/05/14 1820 01/06/14 0036 01/06/14 0355  TROPONINI <0.30 <0.30 <0.30   Urinalysis:   Recent Labs Lab 01/02/14 0555 01/04/14 1014  COLORURINE RED* AMBER*  LABSPEC 1.016 1.023  PHURINE 6.5 6.0  GLUCOSEU NEGATIVE NEGATIVE  HGBUR LARGE* LARGE*  BILIRUBINUR NEGATIVE SMALL*  KETONESUR NEGATIVE 15*  PROTEINUR 30* 100*  UROBILINOGEN 0.2 1.0  NITRITE NEGATIVE NEGATIVE  LEUKOCYTESUR MODERATE* SMALL*   Lipid Panel    Component Value Date/Time   CHOL 107 12/29/2013 0521   TRIG 53 12/29/2013 0521   HDL 49 12/29/2013 0521   CHOLHDL 2.2 12/29/2013 0521   VLDL 11 12/29/2013 0521   LDLCALC 47 12/29/2013 0521   HgbA1C  Lab Results  Component Value Date   HGBA1C 5.8* 12/29/2013    Urine Drug Screen:   No results found for this basename: labopia,   cocainscrnur,  labbenz,  amphetmu,  thcu,  labbarb    Alcohol Level: No results found for this basename: ETH,  in the last 168 hours   CT of the brain  01/05/2014    No evidence of acute intracranial abnormality.  Atrophy, chronic small-vessel white matter ischemic changes and remote infarcts.    MRI of the brain  01/06/2014   Newly seen acute infarction within the left thalamus, left occipital lobe and left temporal lobe. Pattern of distribution most likely reflects left PCA territory infarction.    Carotid Doppler  01/06/2014    Chronic COPD/ emphysema  Cardiomegaly with vascular congestion  No definite pneumonia or CHF     EEG  abnormal with mild generalized nonspecific continuous slowing of cerebral activity. No evidence of an epileptic disorder was demonstrated.  Therapy Recommendations   Physical Exam   Elderly Caucasian male not in distress.Awake alert. Afebrile. Head is nontraumatic. Neck is supple without bruit. Hearing is normal. Cardiac exam no murmur or gallop. Lungs are clear to auscultation. Distal pulses are well felt. Neurological Exam ; Awake alert oriented x 2. nonfluent speech with word hesitation. No dysarthria. Diminished recall.decease blink to threat on right.no facial weakness.no drift. diminished fine finger movements on right and right grip weakness. Orbits left over right upper extremity.  ASSESSMENT [name] is a 80 y.o. male presenting with difficulty with speech output, inability to use his right upper extremity and facial droop. Imaging confirms a new left PCA infarct - thalamus, occipital and temporal lobe. Infarct felt to be  embolic secondary to known atrial fibrillation. Patient also with likely seizure at onset based on HPI.  On xarelto prior to admission. Now on heparin for secondary stroke prevention as pt unable to swallow - passed swallow last pm. Patient with resultant memory deficit, right hemiparesis. Sensory ataxia and expressive aphasia,  dysphagia  Atrial Fibrillation, on cardizem drip and IV heparin as NPO  CHA2DS2-VASc Score for Atrial Fibrillation Stroke Risk = 5   Age in Years:  ?14   +2    Sex:  Male   0    Congestive Heart Failure History:  no    Hypertension History:  yes   +1    Stroke/TIA/Thromboembolism History:  yes   +2   Vascular Disease History:  no    Diabetes Mellitus:  noHx stroke  12/25/2013 high left parietal lobe infarct, discharge to rehab following where current stroke occurred  2012 Lacunar stroke   2010 TIA hypertension Hyperlipidemia, LDL 96, on no statin PTA, at goal LDL < 100  Hospital day # 3  TREATMENT/PLAN  resume xarelto for secondary stroke prevention, discontinue IV heparin  ST following swallow  F/u EEG  Continue therapies - consider return to CIR - they are following - wife supportive  No further stroke workup indicated.  Stroke Service will sign off. Please call should any needs arise.  Follow up with Dr. Leonie Man, [facility], in 2 months.   Burnetta Sabin, MSN, RN, ANVP-BC, ANP-BC, [facility] Pager: [phone] 01/08/2014 9:48 AM  I have personally obtained a history, examined the patient, evaluated imaging results, and formulated the assessment and plan of care. I agree with the above.  Antony Contras, MD

## 2014-01-08 NOTE — Progress Notes (Addendum)
ANTICOAGULATION CONSULT NOTE - Follow Up Consult  Pharmacy Consult:  Heparin Indication: atrial fibrillation  Allergies  Allergen Reactions  . Diazepam     REACTION: agitation  . Ezetimibe-Simvastatin     unknown  . Morphine Other (See Comments)    Hallucinations.  . Sulfonamide Derivatives Hives    Patient Measurements: Height: 6\' 2"  (188 cm) Weight: 128 lb 1.4 oz (58.1 kg) IBW/kg (Calculated) : 82.2   Vital Signs: Temp: 98.2 F (36.8 C) (01/13 0514) Temp src: Oral (01/13 0514) BP: 122/75 mmHg (01/13 0514) Pulse Rate: 95 (01/13 0514)  Labs:  Recent Labs  01/05/14 1339 01/05/14 1820  01/06/14 0025 01/06/14 0036 01/06/14 0355 01/06/14 0940 01/06/14 1854 01/07/14 0445 01/07/14 2035 01/08/14 0410  HGB 11.1*  --   --   --   --   --   --   --  9.5*  --  9.8*  HCT 32.3*  --   --   --   --   --   --   --  27.3*  --  28.4*  PLT 300  --   --   --   --   --   --   --  313  --  311  APTT  --   --   --  37  --   --  50* 59*  --   --   --   HEPARINUNFRC  --   --   < > 0.85*  --   --  0.51 0.36 0.28* 0.31 0.30  CREATININE 0.82  --   --   --   --  0.82  --   --  0.77  --   --   TROPONINI  --  <0.30  --   --  <0.30 <0.30  --   --   --   --   --   < > = values in this interval not displayed.  Estimated Creatinine Clearance: 56.5 ml/min (by C-G formula based on Cr of 0.77).     Assessment: 80 y/o M admitted 12/25/13 with AMS and low grade fever with hematuria. MRI brain showed left parietal infarct.  IV heparin was started for history of Afib while Xarelto is on hold.  Heparin level at the low end of goal.  Noted documentation of residual hematuria from bladder cancer s/p surgery.   Goal of Therapy:  Heparin level 0.3-0.5 units/mL Monitor platelets by anticoagulation protocol: Yes    Plan:  - Increase heparin gtt to 1100 units/hr - Daily HL / CBC  - F/U resume Xarelto - F/U abx LOT    Jerell Demery D. Mina Marble, PharmD, BCPS Pager:  [phone] 01/08/2014, 7:55  AM    ===========================================  Addendum: - Change from IV heparin to Xarelto for Afib - CrCL > 50 ml/min, CBC stable - Unsure why patient's home Xarelto dose was 15mg  PO daily with supper    Plan: - D/C IV heparin gtt at 1200 noon (RN aware) - Xarelto 20mg  PO daily with supper, first dose at 12 noon today with lunch - PRN CBC / BMET    Parnika Tweten D. Mina Marble, PharmD, BCPS Pager:  [phone] 01/08/2014, 10:05 AM

## 2014-01-08 NOTE — Progress Notes (Signed)
Speech Language Pathology Treatment: Dysphagia  Patient Details Name: [name] MRN: [id number] DOB: 1927-02-01 Today's Date: 01/08/2014 Time: 2542-7062 SLP Time Calculation (min): 18 min  Assessment / Plan / Recommendation Clinical Impression  Pt. Currently sleeping soundly.  Wife reports that pt. Did not sleep well last night and has been sleeping more today. SLP reviewed results of MBS, diet recommendations, and aspiration precautions with pt's wife, as she was not available for education after the study on 1/12.  Wife states pt. Ate well at breakfast and lunch.     Possible readmit to Rehab. 1/14.  Will defer SLE to Rehab SLP.Marland Kitchen    HPI HPI: 80 y.o. male with hx of bladder cancer, undergoing new treatment at outside facility, hx of afib on Xarelto (Dr Mare Ferrari), prior CVA, HTN, hyperlipidemia, s/p splenectomy.  Admitted with  acute encephalopathy, likely secondary to influenza and questionable UTI; SIRS, hematuria.  Has been on dysphagia 3 diet with crushed meds since admission - now with difficulty swallowing. MRI: Newly seen acute infarction within the left thalamus, left occipital    Pertinent Vitals LS clear per Nsg., pt. afebrile  SLP Plan  Continue with current plan of care    Recommendations Diet recommendations: Dysphagia 1 (puree);Honey-thick liquid Liquids provided via: Cup;No straw Medication Administration: Whole meds with puree Supervision: Full supervision/cueing for compensatory strategies Compensations: Slow rate;Small sips/bites Postural Changes and/or Swallow Maneuvers: Seated upright 90 degrees              General recommendations: Rehab consult Oral Care Recommendations: Oral care BID Follow up Recommendations: Inpatient Rehab Plan: Continue with current plan of care    GO     Quinn Axe T 01/08/2014, 4:23 PM

## 2014-01-08 NOTE — H&P (Signed)
Physical Medicine and Rehabilitation Admission H&P    CC:  Right sided weakness, speech difficulties, problems swallowing.    HPI:  [name] is a 80 y.o. right handed male with history of COPD, prior CVA 08/2012 with right sided weakness and dysphagia ( CIR stay) , A fib--Xarelto and bladder cancer with ongoing treatment at [facility]. He was admitted 12/25/2013 with altered mental status and low grade fever of 100 Fahrenheit and reported hematuria. Cranial CT scan negative and MRI of the brain showed punctate acute infarct in the high left parietal lobe as well as extensive chronic ischemic changes. Patient's Xarelto held for a short time secondary to hematuria. He was started on IV rocephin due to SIRS as well as Tamiflu as he tested positive for type A influenza. He was placed on Dysphagia 1 nectar thick liquid diet per ST recommendations. Patient with difficulty with processing with easy distractibility, as well as problems with initiation. Needs tactile cues for basic tasks and mobility. Mentation improving and neurology recommends resuming Xarelto for embolic stroke due to A Fib  On 01/05/14, he was completing his shower when he developed acute onset of right sided weakness with speech difficulty. Code stroke was initiated and he was transferred to acute services.   The patient's wife has noted increased problems with vision to the right side. Also has noted uncontrolled movements of the right upper extremity. EEG was negative for seizure. The patient was diagnosed with "Alien hand syndrome" however the symptoms have subsequently improved  Appetite is good, remains on D1 diet Occasional cough ROS Past Medical History  Diagnosis Date  . NEOPLASM, MALIGNANT, BLADDER 04/10/2009  . HYPERLIPIDEMIA 04/10/2009  . ANEMIA-NOS 04/10/2009  . 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 ANXIETY 04/10/2009  . HYPERTENSION 04/10/2009  . Atrial fibrillation 04/10/2009  . COPD 04/10/2009  . TRANSIENT ISCHEMIC  ATTACK, HX OF 04/10/2009  . Bladder cancer     Dr. Lawerance Bach  . Lacunar stroke 04/07/2011  . Pleural effusion 04/07/2011  . 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 Stroke    Past Surgical History  Procedure Laterality Date  . Splenectomy    . Bilateral vats ablation    . Facial cancer      facial skin cancer   Family History  Problem Relation Age of Onset  . Heart disease Mother   . Arthritis Father    Social History:  reports that he has never smoked. He has never used smokeless tobacco. He reports that he does not drink alcohol or use illicit drugs. Allergies:  Allergies  Allergen Reactions  . Diazepam     REACTION: agitation  . Ezetimibe-Simvastatin     unknown  . Morphine Other (See Comments)    Hallucinations.  . Sulfonamide Derivatives Hives   Medications Prior to Admission  Medication Sig Dispense Refill  . Cholecalciferol (VITAMIN D) 2000 UNITS tablet Take 2,000-3,000 Units by mouth daily. Patient takes 2000 units daily, except for taking 3000 units (1.5 tablets) on "winter days" and days when "not feeling well."      . digoxin (LANOXIN) 0.125 MG tablet Take 1 tablet (0.125 mg total) by mouth daily.  30 tablet  5  . diltiazem (CARDIZEM CD) 300 MG 24 hr capsule Take 1 capsule (300 mg total) by mouth daily.  30 capsule  0  . Maltodextrin-Xanthan Gum (RESOURCE THICKENUP CLEAR) POWD Take 120 g by mouth as needed.  10 Can  0  . Probiotic Product (PROBIOTIC DAILY PO) Take 1 tablet by mouth every morning.       Marland Kitchen  Rivaroxaban (XARELTO) 15 MG TABS tablet Take 1 tablet (15 mg total) by mouth daily.  30 tablet  12    Home: Home Living Family/patient expects to be discharged to:: Inpatient rehab Living Arrangements: Spouse/significant other Available Help at Discharge: Available 24 hours/day Type of Home: House Home Access: Stairs to enter [employer] of Steps: 4 Entrance Stairs-Rails: Right Home Layout: One level Home Equipment: Shower seat;Hand held shower  head Additional Comments: tub shower.   Lives With: Spouse   Functional History: Prior Function Comments: wife assists with setting out clothes. Pt has bladder incontinence  Functional Status:  Mobility:          ADL: ADL Grooming: Performed;Wash/dry hands;Wash/dry face;Minimal assistance;Other (comment) (difficulty with R UE initiation/coordination) Upper Body Bathing: Simulated;Moderate assistance Where Assessed - Upper Body Bathing: Supported sitting Lower Body Bathing: Simulated;Maximal assistance Upper Body Dressing: Performed;Moderate assistance Where Assessed - Upper Body Dressing: Supported sitting Lower Body Dressing: Maximal assistance Toilet Transfer Method: Sit to stand Transfers/Ambulation Related to ADLs: sit -stand at EOB with mod - min A for balance/support  Cognition: Cognition Overall Cognitive Status: Difficult to assess Orientation Level: Oriented X4 Cognition Arousal/Alertness: Awake/alert Behavior During Therapy: WFL for tasks assessed/performed Overall Cognitive Status: Difficult to assess Area of Impairment: Memory;Awareness;Problem solving Orientation Level: Disoriented to;Situation Current Attention Level: Sustained Memory: Decreased short-term memory Following Commands: Follows multi-step commands inconsistently Safety/Judgement: Decreased awareness of deficits;Decreased awareness of safety Awareness: Emergent Problem Solving: Slow processing;Difficulty sequencing;Requires verbal cues;Requires tactile cues General Comments: pt is much more alert and oriented today, better command following and improved awareness and attention.  Difficult to assess due to: Impaired communication  Physical Exam: Blood pressure 112/64, pulse 84, temperature 98.3 F (36.8 C), temperature source Oral, resp. rate 18, height 6' 2" (1.88 m), weight 58.1 kg (128 lb 1.4 oz), SpO2 99.00%. Physical Exam  Nursing note and vitals reviewed. Constitutional: He appears  well-developed and well-nourished.  HENT:  Head: Normocephalic and atraumatic.  Eyes: Conjunctivae and EOM are normal. Pupils are equal, round, and reactive to light.  Neck: Normal range of motion. Neck supple.  Cardiovascular: Normal rate and normal heart sounds.  An irregularly irregular rhythm present.  Respiratory: Effort normal. No respiratory distress. He has decreased breath sounds in the right lower field and the left lower field.  Neurological: He is alert. He exhibits normal muscle tone.  Motor strength is 3 minus right deltoid, bicep, tricep, grip, right ankle dorsiflexor 4 minus right hip flexor knee extensor 5/5 left deltoid, bicep, tricep, grip, hip flexors, knee extensors, ankle dorsiflexor plantar flexor  Dysmetria right finger nose to finger testing  Visual field diminished right temporal with confrontational testing  Absent sensation to light touch in the right upper extremity withdraws to pinch right upper extremity Intact sensation to light touch right lower extremity as well as left-sided  Skin: Skin is warm, dry and intact.  Psychiatric: His affect is blunt. His speech is delayed. He is slowed. Cognition and memory are impaired. He does not express impulsivity or inappropriate judgment. He is inattentive.    Results for orders placed during the hospital encounter of 01/05/14 (from the past 48 hour(s))  HEPARIN LEVEL (UNFRACTIONATED)     Status: None   Collection Time    01/06/14  6:54 PM      Result Value Range   Heparin Unfractionated 0.36  0.30 - 0.70 IU/mL   Comment:            IF HEPARIN RESULTS ARE BELOW  EXPECTED VALUES, AND PATIENT     DOSAGE HAS BEEN CONFIRMED,     SUGGEST FOLLOW UP TESTING     OF ANTITHROMBIN III LEVELS.  APTT     Status: Abnormal   Collection Time    01/06/14  6:54 PM      Result Value Range   aPTT 59 (*) 24 - 37 seconds   Comment:            IF BASELINE aPTT IS ELEVATED,     SUGGEST PATIENT RISK ASSESSMENT     BE USED TO  DETERMINE APPROPRIATE     ANTICOAGULANT THERAPY.  GLUCOSE, CAPILLARY     Status: Abnormal   Collection Time    01/06/14  7:31 PM      Result Value Range   Glucose-Capillary 112 (*) 70 - 99 mg/dL  GLUCOSE, CAPILLARY     Status: Abnormal   Collection Time    01/06/14 11:35 PM      Result Value Range   Glucose-Capillary 112 (*) 70 - 99 mg/dL   Comment 1 Notify RN    GLUCOSE, CAPILLARY     Status: None   Collection Time    01/07/14  4:40 AM      Result Value Range   Glucose-Capillary 88  70 - 99 mg/dL   Comment 1 Notify RN    CBC     Status: Abnormal   Collection Time    01/07/14  4:45 AM      Result Value Range   WBC 4.3  4.0 - 10.5 K/uL   RBC 2.89 (*) 4.22 - 5.81 MIL/uL   Hemoglobin 9.5 (*) 13.0 - 17.0 g/dL   HCT 27.3 (*) 39.0 - 52.0 %   MCV 94.5  78.0 - 100.0 fL   MCH 32.9  26.0 - 34.0 pg   MCHC 34.8  30.0 - 36.0 g/dL   RDW 13.9  11.5 - 15.5 %   Platelets 313  150 - 400 K/uL  COMPREHENSIVE METABOLIC PANEL     Status: Abnormal   Collection Time    01/07/14  4:45 AM      Result Value Range   Sodium 136 (*) 137 - 147 mEq/L   Potassium 3.8  3.7 - 5.3 mEq/L   Chloride 98  96 - 112 mEq/L   CO2 27  19 - 32 mEq/L   Glucose, Bld 90  70 - 99 mg/dL   BUN 11  6 - 23 mg/dL   Creatinine, Ser 0.77  0.50 - 1.35 mg/dL   Calcium 8.4  8.4 - 10.5 mg/dL   Total Protein 6.2  6.0 - 8.3 g/dL   Albumin 2.3 (*) 3.5 - 5.2 g/dL   AST 18  0 - 37 U/L   ALT 10  0 - 53 U/L   Alkaline Phosphatase 67  39 - 117 U/L   Total Bilirubin 0.2 (*) 0.3 - 1.2 mg/dL   GFR calc non Af Amer 81 (*) >90 mL/min   GFR calc Af Amer >90  >90 mL/min   Comment: (NOTE)     The eGFR has been calculated using the CKD EPI equation.     This calculation has not been validated in all clinical situations.     eGFR's persistently <90 mL/min signify possible Chronic Kidney     Disease.  MAGNESIUM     Status: None   Collection Time    01/07/14  4:45 AM      Result Value Range  Magnesium 1.9  1.5 - 2.5 mg/dL  HEPARIN  LEVEL (UNFRACTIONATED)     Status: Abnormal   Collection Time    01/07/14  4:45 AM      Result Value Range   Heparin Unfractionated 0.28 (*) 0.30 - 0.70 IU/mL   Comment:            IF HEPARIN RESULTS ARE BELOW     EXPECTED VALUES, AND PATIENT     DOSAGE HAS BEEN CONFIRMED,     SUGGEST FOLLOW UP TESTING     OF ANTITHROMBIN III LEVELS.  GLUCOSE, CAPILLARY     Status: None   Collection Time    01/07/14  8:32 AM      Result Value Range   Glucose-Capillary 92  70 - 99 mg/dL  GLUCOSE, CAPILLARY     Status: Abnormal   Collection Time    01/07/14 12:01 PM      Result Value Range   Glucose-Capillary 102 (*) 70 - 99 mg/dL  GLUCOSE, CAPILLARY     Status: Abnormal   Collection Time    01/07/14  4:31 PM      Result Value Range   Glucose-Capillary 102 (*) 70 - 99 mg/dL  HEPARIN LEVEL (UNFRACTIONATED)     Status: None   Collection Time    01/07/14  8:35 PM      Result Value Range   Heparin Unfractionated 0.31  0.30 - 0.70 IU/mL   Comment:            IF HEPARIN RESULTS ARE BELOW     EXPECTED VALUES, AND PATIENT     DOSAGE HAS BEEN CONFIRMED,     SUGGEST FOLLOW UP TESTING     OF ANTITHROMBIN III LEVELS.  HEPARIN LEVEL (UNFRACTIONATED)     Status: None   Collection Time    01/08/14  4:10 AM      Result Value Range   Heparin Unfractionated 0.30  0.30 - 0.70 IU/mL   Comment:            IF HEPARIN RESULTS ARE BELOW     EXPECTED VALUES, AND PATIENT     DOSAGE HAS BEEN CONFIRMED,     SUGGEST FOLLOW UP TESTING     OF ANTITHROMBIN III LEVELS.  CBC     Status: Abnormal   Collection Time    01/08/14  4:10 AM      Result Value Range   WBC 5.4  4.0 - 10.5 K/uL   RBC 2.99 (*) 4.22 - 5.81 MIL/uL   Hemoglobin 9.8 (*) 13.0 - 17.0 g/dL   HCT 28.4 (*) 39.0 - 52.0 %   MCV 95.0  78.0 - 100.0 fL   MCH 32.8  26.0 - 34.0 pg   MCHC 34.5  30.0 - 36.0 g/dL   RDW 14.0  11.5 - 15.5 %   Platelets 311  150 - 400 K/uL  GLUCOSE, CAPILLARY     Status: Abnormal   Collection Time    01/08/14 11:01 AM       Result Value Range   Glucose-Capillary 116 (*) 70 - 99 mg/dL   Dg Swallowing Func-speech Pathology  01/07/2014   Joaquim Nam, CCC-SLP     01/07/2014 11:57 AM Objective Swallowing Evaluation: Modified Barium Swallowing Study   Patient Details  Name: [name] MRN: [id number] Date of Birth: 1927-09-20  Today's Date: 01/07/2014 Time: 4270-6237 SLP Time Calculation (min): 40 min  Past Medical History:  Past Medical History  Diagnosis Date  .  NEOPLASM, MALIGNANT, BLADDER 04/10/2009  . HYPERLIPIDEMIA 04/10/2009  . ANEMIA-NOS 04/10/2009  . 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 ANXIETY 04/10/2009  . HYPERTENSION 04/10/2009  . Atrial fibrillation 04/10/2009  . COPD 04/10/2009  . TRANSIENT ISCHEMIC ATTACK, HX OF 04/10/2009  . Bladder cancer     Dr. Lawerance Bach  . Lacunar stroke 04/07/2011  . Pleural effusion 04/07/2011  . 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 Stroke    Past Surgical History:  Past Surgical History  Procedure Laterality Date  . Splenectomy    . Bilateral vats ablation    . Facial cancer      facial skin cancer   HPI:  80 y.o. male with hx of bladder cancer, undergoing new treatment  at outside facility, hx of afib on Xarelto (Dr Mare Ferrari), prior  CVA, HTN, hyperlipidemia, s/p splenectomy.  Admitted with  acute  encephalopathy, likely secondary to influenza and questionable  UTI; SIRS, hematuria.  Has been on dysphagia 3 diet with crushed  meds since admission - now with difficulty swallowing. MRI: Newly  seen acute infarction within the left thalamus, left occipital      Assessment / Plan / Recommendation Clinical Impression  Dysphagia Diagnosis: Moderate oral phase dysphagia;Moderate  pharyngeal phase dysphagia Clinical impression: Pt. exhibits a moderate oropharyngeal  dysphagia as evidenced by decreased lingual strength and  coordination for bolus propulsion, decreased base of tongue  contraction to the pharyngeal wall, decreased anterior movement  of the hyoid, and decreased laryngeal elevation, resulting in   positive aspiration of nectar thick liquids with a delayed and  ineffective cough response, and moderate vallecular residue after  the swallow.  Pt. was able to clear most residue with moderate  verbal cues to "swallow again."    Treatment Recommendation  Therapy as outlined in treatment plan below    Diet Recommendation Dysphagia 1 (Puree);Honey-thick liquid   Liquid Administration via: Cup;No straw Medication Administration: Whole meds with puree Supervision: Full supervision/cueing for compensatory strategies Compensations: Slow rate;Small sips/bites Postural Changes and/or Swallow Maneuvers: Seated upright 90  degrees    Other  Recommendations Oral Care Recommendations: Oral care BID Other Recommendations: Order thickener from pharmacy;Clarify  dietary restrictions;Have oral suction available   Follow Up Recommendations  Inpatient Rehab    Frequency and Duration min 2x/week  2 weeks   Pertinent Vitals/Pain Afebrile; LS diminished.    SLP Swallow Goals     General Date of Onset: 12/25/13 HPI: 80 y.o. male with hx of bladder cancer, undergoing new  treatment at outside facility, hx of afib on Xarelto (Dr  Mare Ferrari), prior CVA, HTN, hyperlipidemia, s/p splenectomy.   Admitted with  acute encephalopathy, likely secondary to  influenza and questionable UTI; SIRS, hematuria.  Has been on  dysphagia 3 diet with crushed meds since admission - now with  difficulty swallowing. MRI: Newly seen acute infarction within  the left thalamus, left occipital  Type of Study: Modified Barium Swallowing Study Reason for Referral: Objectively evaluate swallowing function Previous Swallow Assessment: 08/18/12 MBS Clinical impression: Mr.  Wilhelmsen presents with a mild oral dyspahgia with mild oral  residual due to left labial and lingual weakness. More signficant  oropharyngeal dysphagia with sensory/motor deficits leading to a  delay in swallow initiaton and silent aspriaiton of thin liquids  before the swallow. Furthermore weakness of  base of tongue and  hyolaryngeal complex results in mild to moderate residual that  necesitates verbal cues for a second swallow to clear. Pt is a  moderate aspiration risk Diet Prior  to this Study: NPO Temperature Spikes Noted: No Respiratory Status: Room air History of Recent Intubation: No Behavior/Cognition: Alert;Cooperative;Requires cueing;Decreased  sustained attention;Distractible Oral Cavity - Dentition: Adequate natural dentition Oral Motor / Sensory Function: Impaired motor Oral impairment: Right lingual;Right labial Self-Feeding Abilities: Needs assist Patient Positioning: Upright in chair Baseline Vocal Quality: Low vocal intensity Volitional Cough: Weak;Wet;Congested Volitional Swallow: Unable to elicit Anatomy: Within functional limits Pharyngeal Secretions: Not observed secondary MBS    Reason for Referral Objectively evaluate swallowing function   Oral Phase Oral Preparation/Oral Phase Oral Phase: Impaired Oral - Solids Oral - Puree: Weak lingual manipulation;Lingual  pumping;Incomplete tongue to palate contact;Reduced posterior  propulsion;Holding of bolus;Delayed oral transit Oral - Pill: Weak lingual manipulation;Lingual pumping;Reduced  posterior propulsion;Holding of bolus;Delayed oral transit   Pharyngeal Phase Pharyngeal Phase Pharyngeal Phase: Impaired Pharyngeal - Honey Pharyngeal - Honey Cup: Delayed swallow initiation;Premature  spillage to valleculae;Reduced anterior laryngeal  mobility;Reduced laryngeal elevation;Reduced tongue base  retraction;Pharyngeal residue - valleculae Pharyngeal - Nectar Pharyngeal - Nectar Teaspoon: Delayed swallow initiation;Reduced  epiglottic inversion;Reduced anterior laryngeal mobility;Reduced  laryngeal elevation;Reduced airway/laryngeal closure;Reduced  tongue base retraction;Penetration/Aspiration during  swallow;Moderate aspiration;Pharyngeal residue - valleculae Penetration/Aspiration details (nectar teaspoon): Material enters  airway, passes BELOW  cords and not ejected out despite cough  attempt by patient Pharyngeal - Solids Pharyngeal - Puree: Delayed swallow initiation;Premature spillage  to valleculae;Reduced anterior laryngeal mobility;Reduced  laryngeal elevation;Reduced tongue base retraction;Pharyngeal  residue - valleculae;Compensatory strategies attempted (Comment)  (Most clears with repeat dry swallow)  Cervical Esophageal Phase    GO    Cervical Esophageal Phase Cervical Esophageal Phase: Silverio Lay T 01/07/2014, 11:56 AM     Post Admission Physician Evaluation: 1. Functional deficits secondary  to left posterior cerebral artery infarct with right hemiparesis, right homonymous hemianopsia, right hemisensory deficits. 2. Patient is admitted to receive collaborative, interdisciplinary care between the physiatrist, rehab nursing staff, and therapy team. 3. Patient's level of medical complexity and substantial therapy needs in context of that medical necessity cannot be provided at a lesser intensity of care such as a SNF. 4. Patient has experienced substantial functional loss from his/her baseline which was documented above under the "Functional History" and "Functional Status" headings.  Judging by the patient's diagnosis, physical exam, and functional history, the patient has potential for functional progress which will result in measurable gains while on inpatient rehab.  These gains will be of substantial and practical use upon discharge  in facilitating mobility and self-care at the household level. 5. Physiatrist will provide 24 hour management of medical needs as well as oversight of the therapy plan/treatment and provide guidance as appropriate regarding the interaction of the two. 6. 24 hour rehab nursing will assist with bladder management, bowel management, safety, skin/wound care, disease management, medication administration, pain management and patient education  and help integrate therapy concepts,  techniques,education, etc. 7. PT will assess and treat for/with: pre gait, gait training, endurance , safety, equipment, neuromuscular re education.   Goals are: Supervison/ Min A mobility. 8. OT will assess and treat for/with: ADLs, Cognitive perceptual skills, Neuromuscular re education, safety, endurance, equipment.   Goals are: Sup/MinA. 9. SLP will assess and treat for/with: Attend to right side.Dysphagia  Goals are: compensate for field cut, safe po intake normal consistencies. 10. Case Management and Social Worker will assess and treat for psychological issues and discharge planning. 11. Team conference will be held weekly to assess progress toward goals and to determine  barriers to discharge. 12. Patient will receive at least 3 hours of therapy per day at least 5 days per week. 13. ELOS: 19-22 days       14. Prognosis:  good   Medical Problem List and Plan: 1. DVT Prophylaxis/Anticoagulation: Pharmaceutical: Xarelto 2. Pain Management: tyelnol 3. Mood: monitor for depression, encourage interaction 4. Neuropsych: This patient is not capable of making decisions on his own behalf. 5.  Afib Xarelto 6.  Resp- recent Influenza and hx COPD- IS,monitor     Charlett Blake M.D. [facility] FAAPM&R (Sports Med, Neuromuscular Med) Diplomate Am Board of Electrodiagnostic Med Diplomate Am Board of Pain Medicine Fellow Am Board of Interventional Pain Physicians  01/08/2014

## 2014-01-08 NOTE — Progress Notes (Signed)
I await insurance approval to readmit pt to inpt rehab tomorrow. [phone]

## 2014-01-08 NOTE — Discharge Summary (Signed)
Physician Discharge Summary  [name] NIO:[id number] DOB: 10/25/1927 DOA: 01/05/2014  PCP: Cathlean Cower, MD  Admit date: 01/05/2014 Discharge date: 01/08/2014  Time spent: 30 minutes  Recommendations for Outpatient Follow-up:  Needs digoxin level TSH in about one to 2 weeksand other screening labs such as CBC and complete metabolic panel in 2-3 days  Will benefit from in hospital rehabilitation Will need extensive speech therapy as his deficits are verbal and right handed  Discharge Diagnoses:  Principal Problem:   Acute CVA (cerebrovascular accident) Active Problems:   Altered mental status   Discharge Condition: stable  Diet recommendation: heart healthy  Filed Weights   01/06/14 0501 01/07/14 0444 01/08/14 0514  Weight: 58.6 kg (129 lb 3 oz) 58.1 kg (128 lb 1.4 oz) 58.1 kg (128 lb 1.4 oz)    History of present illness:  80 y.o. male a history of atrial fibrillation on Xarelto, hypertension, hyperlipidemia, COPD and recent small left parietal acute stroke12/30/2014, currently an inpatient on the rehabilitation service, who developed altered mental status with decreased speech as well as reduced movement of right extremities earlier this morning. Code stroke was activated. NIH stroke score was 3. CT scan of his head showed no acute intracranial abnormality. Patient was very slow to respond but was oriented to his correct age as well as correct month. He was able to follow commands but was very slow in initiating movements. He tended to gaze to the left side. He also had repetitive movements of his right hand with extension of his right arm. There was a slight drift of his right upper extremity but no weakness of his right lower extremity. He was given 0.5 mg of Ativan with no change in mental status. An EEG was obtained which was negative. Study showed slight generalized slowing but was otherwise unremarkable. Urinalysis on 01/04/2014 which showed cloudy urine with evidence of blood and  few bacteria, which showed staph  Patient was not a TPA candidate secondary to stroke with in the past 90 days. He was admitted for further evaluation and treatment   Hospital Course:  Acute CVA  Newly seen acute infarction within the left thalamus, left occipital lobe and left temporal lobe. Pattern of distribution most likely reflects left PCA territory infarction. Neurology following. IV heparin transitioned back to by mouth Xarelto 15 mg. SLP consult recommended dysphagia 1 diet.  Please note there is a documented allergy to statins  Consulted Inpatient reha b4 possible return back to CIR Atrial fibrillation with RVR:  Admitted to telemetry and started on IV cardizem. This was subsequently transitioned to Cardizem 300 mg daily, digoxin 0.125 Serial troponins negative for acute myocardial injury.  Staphylococcal Pyelonephritis-Started on rocephin .  Continue oral Macrobid 100 twice a day for 1 more day Dysphagia: see above-will need skilled cognitive SLP input Hypokalemia - repleted   Procedures:  EEG  Chest x-ray  CT head 1/10  MRN 1/11   Consultations:  Stroke  Cardiology  Discharge Exam: [name] Vitals:   01/08/14 1210  BP: 151/53  Pulse: 85  Temp: 97.6 F (36.4 C)  Resp: 18   Alert verbal Moving left hand and right hand, past-pointing on the right side, no chest pain or shortness of breath  General: EOMI, NCAT Cardiovascular: S1-S2 no murmur rub or gallop Respiratory: clinically clear  Discharge Instructions  Discharge Orders   Future Appointments Provider Department Dept Phone   03/08/2014 10:15 AM Darlin Coco, MD [place] Office [phone]   Future Orders Complete By  Expires   Diet - low sodium heart healthy  As directed    Discharge instructions  As directed    Comments:     Will need aggressive speech, physical and occupational therapy Recommend followup with neurologist in 2 months   Increase activity slowly  As directed         Medication List         digoxin 0.125 MG tablet  Commonly known as:  LANOXIN  Take 1 tablet (0.125 mg total) by mouth daily.     diltiazem 300 MG 24 hr capsule  Commonly known as:  CARDIZEM CD  Take 1 capsule (300 mg total) by mouth daily.     PROBIOTIC DAILY PO  Take 1 tablet by mouth every morning.     RESOURCE THICKENUP CLEAR Powd  Take 120 g by mouth as needed.     Rivaroxaban 15 MG Tabs tablet  Commonly known as:  XARELTO  Take 1 tablet (15 mg total) by mouth daily.     Vitamin D 2000 UNITS tablet  Take 2,000-3,000 Units by mouth daily. Patient takes 2000 units daily, except for taking 3000 units (1.5 tablets) on "winter days" and days when "not feeling well."       Allergies  Allergen Reactions  . Diazepam     REACTION: agitation  . Ezetimibe-Simvastatin     unknown  . Morphine Other (See Comments)    Hallucinations.  . Sulfonamide Derivatives Hives       Follow-up Information   Follow up with SETHI,PRAMODKUMAR P, MD. Schedule an appointment as soon as possible for a visit in 2 months. (stroke clinic)    Specialties:  Neurology, Radiology   Contact information:   [address] [place] [address] [phone]        The results of significant diagnostics from this hospitalization (including imaging, microbiology, ancillary and laboratory) are listed below for reference.    Significant Diagnostic Studies: Dg Chest 2 View  01/06/2014   CLINICAL DATA:  Acute stroke  EXAM: CHEST  2 VIEW  COMPARISON:  12/25/2013  FINDINGS: Background COPD/ emphysema noted with mild hyperinflation and parenchymal scarring. Heart is enlarged with vascular congestion. No definite CHF, effusion or pneumonia. Skin folds overlie the mid chest regions bilaterally. Chronic apical scarring. No pneumothorax. Trachea is midline. Atherosclerosis of the aorta.  IMPRESSION: Chronic COPD/ emphysema  Cardiomegaly with vascular congestion  No definite pneumonia or CHF    Electronically Signed   By: Daryll Brod M.D.   On: 01/06/2014 12:53   Dg Chest 2 View  12/25/2013   CLINICAL DATA:  Weakness  EXAM: CHEST  2 VIEW  COMPARISON:  08/21/2012  FINDINGS: Heart size and mediastinal contours are normal. The vascular pattern is normal. Bilateral pleural apical thickening is stable. There is no consolidation or effusion. Chronic left rib deformities due to prior fractures, stable.  IMPRESSION: No active cardiopulmonary disease.   Electronically Signed   By: Skipper Cliche M.D.   On: 12/25/2013 16:55   Ct Head Wo Contrast  01/05/2014   CLINICAL DATA:  80 year old male with right upper extremity weakness and aphasia. Code stroke.  EXAM: CT HEAD WITHOUT CONTRAST  TECHNIQUE: Contiguous axial images were obtained from the base of the skull through the vertex without intravenous contrast.  COMPARISON:  12/27/2013 CT and MRI and prior examinations.  FINDINGS: Moderate chronic small-vessel white matter ischemic changes, generalized cerebral volume loss and remote lacunar infarcts within the deep white matter, basal ganglia  and thalami again noted.  No acute intracranial abnormalities are identified, including mass lesion or mass effect, hydrocephalus, extra-axial fluid collection, midline shift, hemorrhage, or acute infarction. The visualized bony calvarium is unremarkable.  IMPRESSION: No evidence of acute intracranial abnormality.  Atrophy, chronic small-vessel white matter ischemic changes and remote infarcts.  Critical Value/emergent results were called by telephone at the time of interpretation on 01/05/2014 at 12:08 PM to Dr. Wallie Char , who verbally acknowledged these results.   Electronically Signed   By: Hassan Rowan M.D.   On: 01/05/2014 12:08   Ct Head Wo Contrast  12/27/2013   CLINICAL DATA:  Patient having trouble finding words.  EXAM: CT HEAD WITHOUT CONTRAST  TECHNIQUE: Contiguous axial images were obtained from the base of the skull through the vertex without intravenous  contrast.  COMPARISON:  08/20/2012  FINDINGS: There is no evidence of mass effect, midline shift, or extra-axial fluid collections. There is no evidence of a space-occupying lesion or intracranial hemorrhage. There is no evidence of a cortical-based area of acute infarction. There is an old right corona radiata infarct. There is an old left basal ganglia lacunar infarct. . There is generalized cerebral atrophy. There is periventricular white matter low attenuation likely secondary to microangiopathy.  The ventricles and sulci are appropriate for the patient's age. The basal cisterns are patent.  Visualized portions of the orbits are unremarkable. The visualized portions of the paranasal sinuses and mastoid air cells are unremarkable.  The osseous structures are unremarkable.  IMPRESSION: No acute intracranial pathology.   Electronically Signed   By: Kathreen Devoid   On: 12/27/2013 11:14   Mr Brain Wo Contrast  01/06/2014   CLINICAL DATA:  Mental status changes.  Right arm weakness.  EXAM: MRI HEAD WITHOUT CONTRAST  TECHNIQUE: Multiplanar, multiecho pulse sequences of the brain and surrounding structures were obtained without intravenous contrast.  COMPARISON:  Head CT 01/05/2014.  MRI 12/27/2013.  FINDINGS: Diffusion imaging shows new acute infarction in the left occipital lobe, left thalamus and left temporal lobe most consistent with PCA territory infarction. Areas of involvement show mild swelling but there is no hemorrhage or mass effect.  There chronic small vessel changes throughout the pons. There are old small vessel cerebellar infarctions. There are old lacunar infarctions within the basal ganglia and there are chronic small vessel changes throughout the cerebral hemispheric white matter. No mass lesion. No hydrocephalus or extra-axial collection. Major vessels at the base of the brain show flow.  IMPRESSION: Newly seen acute infarction within the left thalamus, left occipital lobe and left temporal lobe.  Pattern of distribution most likely reflects left PCA territory infarction.   Electronically Signed   By: Nelson Chimes M.D.   On: 01/06/2014 13:37   Mr Brain Wo Contrast  12/27/2013   CLINICAL DATA:  Stroke.  Altered mental status.  Speech disturbance.  EXAM: MRI HEAD WITHOUT CONTRAST  TECHNIQUE: Multiplanar, multiecho pulse sequences of the brain and surrounding structures were obtained without intravenous contrast.  COMPARISON:  Head CT same day .  FINDINGS: Diffusion imaging shows a punctate acute infarction in the left parietal lobe near the vertex. No other acute insult. There chronic small-vessel changes affecting the pons. There are old small vessel cerebellar infarctions. There are numerous old lacunar infarctions affecting the basal ganglia, thalami and hemispheric deep white matter. There is widespread chronic small vessel disease throughout the white matter of the cerebral hemispheres. No mass lesion, acute hemorrhage, hydrocephalus or extra-axial collection. No pituitary mass.  No inflammatory sinus disease. There is a small amount of fluid in the mastoid air cells on the right. Major vessels at the base of the brain show flow.  IMPRESSION: Punctate acute infarction in the high left parietal lobe.  Extensive chronic ischemic changes elsewhere throughout the brain as outlined above.   Electronically Signed   By: Nelson Chimes M.D.   On: 12/27/2013 17:01   Dg Swallowing Func-speech Pathology  01/07/2014   Joaquim Nam, CCC-SLP     01/07/2014 11:57 AM Objective Swallowing Evaluation: Modified Barium Swallowing Study   Patient Details  Name: Aarush Sahagian MRN: [id number] Date of Birth: 10-Jun-1927  Today's Date: 01/07/2014 Time: [id number] SLP Time Calculation (min): 40 min  Past Medical History:  Past Medical History  Diagnosis Date  . NEOPLASM, MALIGNANT, BLADDER 04/10/2009  . HYPERLIPIDEMIA 04/10/2009  . ANEMIA-NOS 04/10/2009  . 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 ANXIETY 04/10/2009  . HYPERTENSION  04/10/2009  . Atrial fibrillation 04/10/2009  . COPD 04/10/2009  . TRANSIENT ISCHEMIC ATTACK, HX OF 04/10/2009  . Bladder cancer     Dr. Lawerance Bach  . Lacunar stroke 04/07/2011  . Pleural effusion 04/07/2011  . 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 Stroke    Past Surgical History:  Past Surgical History  Procedure Laterality Date  . Splenectomy    . Bilateral vats ablation    . Facial cancer      facial skin cancer   HPI:  80 y.o. male with hx of bladder cancer, undergoing new treatment  at outside facility, hx of afib on Xarelto (Dr Mare Ferrari), prior  CVA, HTN, hyperlipidemia, s/p splenectomy.  Admitted with  acute  encephalopathy, likely secondary to influenza and questionable  UTI; SIRS, hematuria.  Has been on dysphagia 3 diet with crushed  meds since admission - now with difficulty swallowing. MRI: Newly  seen acute infarction within the left thalamus, left occipital      Assessment / Plan / Recommendation Clinical Impression  Dysphagia Diagnosis: Moderate oral phase dysphagia;Moderate  pharyngeal phase dysphagia Clinical impression: Pt. exhibits a moderate oropharyngeal  dysphagia as evidenced by decreased lingual strength and  coordination for bolus propulsion, decreased base of tongue  contraction to the pharyngeal wall, decreased anterior movement  of the hyoid, and decreased laryngeal elevation, resulting in  positive aspiration of nectar thick liquids with a delayed and  ineffective cough response, and moderate vallecular residue after  the swallow.  Pt. was able to clear most residue with moderate  verbal cues to "swallow again."    Treatment Recommendation  Therapy as outlined in treatment plan below    Diet Recommendation Dysphagia 1 (Puree);Honey-thick liquid   Liquid Administration via: Cup;No straw Medication Administration: Whole meds with puree Supervision: Full supervision/cueing for compensatory strategies Compensations: Slow rate;Small sips/bites Postural Changes and/or Swallow Maneuvers: Seated upright 90   degrees    Other  Recommendations Oral Care Recommendations: Oral care BID Other Recommendations: Order thickener from pharmacy;Clarify  dietary restrictions;Have oral suction available   Follow Up Recommendations  Inpatient Rehab    Frequency and Duration min 2x/week  2 weeks   Pertinent Vitals/Pain Afebrile; LS diminished.    SLP Swallow Goals     General Date of Onset: 12/25/13 HPI: 80 y.o. male with hx of bladder cancer, undergoing new  treatment at outside facility, hx of afib on Xarelto (Dr  Mare Ferrari), prior CVA, HTN, hyperlipidemia, s/p splenectomy.   Admitted with  acute encephalopathy, likely secondary to  influenza and questionable UTI; SIRS, hematuria.  Has been on  dysphagia 3 diet with crushed meds since admission - now with  difficulty swallowing. MRI: Newly seen acute infarction within  the left thalamus, left occipital  Type of Study: Modified Barium Swallowing Study Reason for Referral: Objectively evaluate swallowing function Previous Swallow Assessment: 08/18/12 MBS Clinical impression: Mr.  Kazmierski presents with a mild oral dyspahgia with mild oral  residual due to left labial and lingual weakness. More signficant  oropharyngeal dysphagia with sensory/motor deficits leading to a  delay in swallow initiaton and silent aspriaiton of thin liquids  before the swallow. Furthermore weakness of base of tongue and  hyolaryngeal complex results in mild to moderate residual that  necesitates verbal cues for a second swallow to clear. Pt is a  moderate aspiration risk Diet Prior to this Study: NPO Temperature Spikes Noted: No Respiratory Status: Room air History of Recent Intubation: No Behavior/Cognition: Alert;Cooperative;Requires cueing;Decreased  sustained attention;Distractible Oral Cavity - Dentition: Adequate natural dentition Oral Motor / Sensory Function: Impaired motor Oral impairment: Right lingual;Right labial Self-Feeding Abilities: Needs assist Patient Positioning: Upright in chair Baseline  Vocal Quality: Low vocal intensity Volitional Cough: Weak;Wet;Congested Volitional Swallow: Unable to elicit Anatomy: Within functional limits Pharyngeal Secretions: Not observed secondary MBS    Reason for Referral Objectively evaluate swallowing function   Oral Phase Oral Preparation/Oral Phase Oral Phase: Impaired Oral - Solids Oral - Puree: Weak lingual manipulation;Lingual  pumping;Incomplete tongue to palate contact;Reduced posterior  propulsion;Holding of bolus;Delayed oral transit Oral - Pill: Weak lingual manipulation;Lingual pumping;Reduced  posterior propulsion;Holding of bolus;Delayed oral transit   Pharyngeal Phase Pharyngeal Phase Pharyngeal Phase: Impaired Pharyngeal - Honey Pharyngeal - Honey Cup: Delayed swallow initiation;Premature  spillage to valleculae;Reduced anterior laryngeal  mobility;Reduced laryngeal elevation;Reduced tongue base  retraction;Pharyngeal residue - valleculae Pharyngeal - Nectar Pharyngeal - Nectar Teaspoon: Delayed swallow initiation;Reduced  epiglottic inversion;Reduced anterior laryngeal mobility;Reduced  laryngeal elevation;Reduced airway/laryngeal closure;Reduced  tongue base retraction;Penetration/Aspiration during  swallow;Moderate aspiration;Pharyngeal residue - valleculae Penetration/Aspiration details (nectar teaspoon): Material enters  airway, passes BELOW cords and not ejected out despite cough  attempt by patient Pharyngeal - Solids Pharyngeal - Puree: Delayed swallow initiation;Premature spillage  to valleculae;Reduced anterior laryngeal mobility;Reduced  laryngeal elevation;Reduced tongue base retraction;Pharyngeal  residue - valleculae;Compensatory strategies attempted (Comment)  (Most clears with repeat dry swallow)  Cervical Esophageal Phase    GO    Cervical Esophageal Phase Cervical Esophageal Phase: Silverio Lay T 01/07/2014, 11:56 AM     Microbiology: Recent Results (from the past 240 hour(s))  URINE CULTURE     Status: None   Collection  Time    01/02/14  5:55 AM      Result Value Range Status   Specimen Description URINE, CLEAN CATCH   Final   Special Requests NONE   Final   Culture  Setup Time     Final   Value: 01/02/2014 11:02     Performed at [facility]     Final   Value: >=100,000 COLONIES/ML     Performed at [facility]   Culture     Final   Value: Multiple bacterial morphotypes present, none predominant. Suggest appropriate recollection if clinically indicated.     Performed at [facility]   Report Status 01/03/2014 FINAL   Final  URINE CULTURE     Status: None   Collection Time    01/04/14 10:14 AM      Result Value Range  Status   Specimen Description URINE, CATHETERIZED   Final   Special Requests NONE   Final   Culture  Setup Time     Final   Value: 01/04/2014 11:20     Performed at [facility]     Final   Value: >=100,000 COLONIES/ML     Performed at [facility]   Culture     Final   Value: STAPHYLOCOCCUS SPECIES (COAGULASE NEGATIVE)     Note: RIFAMPIN AND GENTAMICIN SHOULD NOT BE USED AS SINGLE DRUGS FOR TREATMENT OF STAPH INFECTIONS.     Performed at [facility]   Report Status 01/07/2014 FINAL   Final   Organism ID, Bacteria STAPHYLOCOCCUS SPECIES (COAGULASE NEGATIVE)   Final     Labs: Basic Metabolic Panel:  Recent Labs Lab 01/02/14 0528 01/05/14 1339 01/06/14 0355 01/07/14 0445  NA 139 138 137 136*  K 4.1 4.6 3.6* 3.8  CL 102 100 98 98  CO2 27 27 29 27   GLUCOSE 110* 101* 86 90  BUN 23 24* 17 11  CREATININE 0.90 0.82 0.82 0.77  CALCIUM 8.5 9.0 8.3* 8.4  MG  --   --   --  1.9   Liver Function Tests:  Recent Labs Lab 01/02/14 0528 01/06/14 0355 01/07/14 0445  AST 21 20 18   ALT 14 13 10   ALKPHOS 75 69 67  BILITOT 0.5 0.4 0.2*  PROT 6.3 6.1 6.2  ALBUMIN 2.5* 2.3* 2.3*   No results found for this basename: LIPASE, AMYLASE,  in the last 168 hours No results found for this basename:  AMMONIA,  in the last 168 hours CBC:  Recent Labs Lab 01/02/14 0528 01/05/14 1339 01/07/14 0445 01/08/14 0410  WBC 5.8 8.8 4.3 5.4  NEUTROABS 3.8 7.4  --   --   HGB 11.1* 11.1* 9.5* 9.8*  HCT 33.3* 32.3* 27.3* 28.4*  MCV 96.5 96.4 94.5 95.0  PLT 195 300 313 311   Cardiac Enzymes:  Recent Labs Lab 01/05/14 1820 01/06/14 0036 01/06/14 0355  TROPONINI <0.30 <0.30 <0.30   BNP: BNP (last 3 results) No results found for this basename: PROBNP,  in the last 8760 hours CBG:  Recent Labs Lab 01/07/14 0440 01/07/14 0832 01/07/14 1201 01/07/14 1631 01/08/14 1101  GLUCAP 88 92 102* 102* 116*       Signed:  Clever Geraldo, [id number]  [facility] 01/08/2014, 12:32 PM

## 2014-01-08 NOTE — Progress Notes (Signed)
Physical Therapy Treatment Patient Details Name: [name] MRN: [id number] DOB: 1927/09/02 Today's Date: 01/08/2014 Time: 9678-9381 PT Time Calculation (min): 28 min  PT Assessment / Plan / Recommendation  History of Present Illness Pt with history of COPD, prior CVA 08/2012 with right sided weakness and dysphagia ( CIR stay) , A fib--Xarelto and bladder cancer with ongoing treatment at [facility]. He was admitted 12/25/2013 with altered mental status and low grade fever and reported hematuria. Cranial CT scan negative and MRI of the brain showed punctate acute infarct in the high left parietal lobe as well as extensive chronic ischemic changes. Patient transferred to CIR on 01/01/14.  On 01/05/14 patient with increased weakness, slurring speech.  Code stroke called and pt returned to acute care for further w/u.  MRI pending.   PT Comments   Pt admitted with above. Pt currently with functional limitations due to right hemibody weakness as well as poor endurance.  Feel pt is still a good rehab candidate.   Pt will benefit from skilled PT to increase their independence and safety with mobility to allow discharge to the venue listed below.   Follow Up Recommendations  CIR                 Equipment Recommendations  Other (comment) (TBA)    Recommendations for Other Services Rehab consult  Frequency Min 4X/week   Progress towards PT Goals Progress towards PT goals: Progressing toward goals  Plan Current plan remains appropriate    Precautions / Restrictions Precautions Precautions: Fall Restrictions Weight Bearing Restrictions: No   Pertinent Vitals/Pain VSS, no pain    Mobility  Bed Mobility Overal bed mobility: Needs Assistance Bed Mobility: Supine to Sit Rolling: Mod assist Supine to sit: Min assist Sit to supine: Mod assist General bed mobility comments: Pt needed assist for moving LEs off bed as well as to elevate trunk. Transfers Overall transfer level: Needs  assistance Equipment used: 2 person hand held assist Transfers: Sit to/from Stand Sit to Stand: +2 physical assistance;Mod assist;Min assist General transfer comment: sit -stand at EOB with mod - min A for balance/support.  Pt needed assist for hip and knee extension as pt slightly flexed at hips, knees and trunk.  Pt needed constant cues for full upright posture.  Pt was able to stand close to fully upright and was standing better today than yesterday.  Pt holding onto chair with left UE and right UE being held in place by PT.  Performed some pre gait activities by pt stepping forward and backward.  Definitely needed mod assist for support of trunk for stepping.     PT Goals (current goals can now be found in the care plan section)    Visit Information  Last PT Received On: 01/08/14 Assistance Needed: +2 History of Present Illness: Pt with history of COPD, prior CVA 08/2012 with right sided weakness and dysphagia ( CIR stay) , A fib--Xarelto and bladder cancer with ongoing treatment at [facility]. He was admitted 12/25/2013 with altered mental status and low grade fever and reported hematuria. Cranial CT scan negative and MRI of the brain showed punctate acute infarct in the high left parietal lobe as well as extensive chronic ischemic changes. Patient transferred to CIR on 01/01/14.  On 01/05/14 patient with increased weakness, slurring speech.  Code stroke called and pt returned to acute care for further w/u.  MRI pending.    Subjective Data  Subjective: Pt with slurred speech.   Cognition  Cognition Arousal/Alertness: Awake/alert  Behavior During Therapy: WFL for tasks assessed/performed Overall Cognitive Status: Difficult to assess Area of Impairment: Memory;Awareness;Problem solving Orientation Level: Disoriented to;Situation Current Attention Level: Sustained Memory: Decreased short-term memory Following Commands: Follows multi-step commands inconsistently Safety/Judgement: Decreased awareness  of deficits;Decreased awareness of safety Awareness: Emergent Problem Solving: Slow processing;Difficulty sequencing;Requires verbal cues;Requires tactile cues General Comments: pt is much more alert and oriented today, better command following and improved awareness and attention.  Difficult to assess due to: Impaired communication    Balance  Balance Overall balance assessment: Needs assistance;History of Falls Sitting-balance support: Bilateral upper extremity supported;Feet supported Sitting balance-Leahy Scale: Fair Sitting balance - Comments: Sat EOB needing min guard to min assist if challenged.  Pt with poor coordination of entire right hemibody with some ? inattention to right side.   Postural control: Posterior lean Standing balance support: Bilateral upper extremity supported;During functional activity Standing balance-Leahy Scale: Poor Standing balance comment: Stood 3 min with bil HHA with left hand holding onto chair back and right hand supported on chair back by PT.  Pt able to extend hips, trunk and knees with commands.    End of Session PT - End of Session Equipment Utilized During Treatment: Gait belt Activity Tolerance: Patient limited by fatigue Patient left: in bed;with call bell/phone within reach;with family/visitor present Nurse Communication: Mobility status        [name],Leonard Rufus 01/08/2014, 4:10 PM [facility] Acute Rehabilitation [phone] (pager)

## 2014-01-08 NOTE — Discharge Summary (Signed)
Physician Discharge Summary  Patient ID: [name] MRN: [id number] DOB/AGE: 05-05-1927 80 y.o.  Admit date: 01/01/2014 Discharge date: 01/05/14  Discharge Diagnoses: Slurred speech with mental status changes and right sided weakness Principal Problem:   CVA (cerebral infarction) Active Problems:   NEOPLASM, MALIGNANT, BLADDER   HYPERTENSION   Nocturia   Atrial fibrillation   Altered mental status   Discharged Condition: Guarded.    Labs:  Basic Metabolic Panel:  Recent Labs Lab 01/02/14 0528  NA 139  K 4.1  CL 102  CO2 27  GLUCOSE 110*  BUN 23  CREATININE 0.90  CALCIUM 8.5  MG  --     CBC:  Recent Labs Lab 01/02/14 0528  WBC 5.8  NEUTROABS 3.8  HGB 11.1*  HCT 33.3*  MCV 96.5  PLT 195     Brief HPI:   [name] is a 80 y.o. right handed male with history of COPD, prior CVA 08/2012 with right sided weakness and dysphagia ( CIR stay) , A fib--Xarelto and bladder cancer with ongoing treatment at [facility]. He was admitted 12/25/2013 with altered mental status and low grade fever of 100 Fahrenheit and reported hematuria. Cranial CT scan negative and MRI of the brain showed punctate acute infarct in the high left parietal lobe as well as extensive chronic ischemic changes. Patient's Xarelto held for a short time secondary to hematuria. He was started on IV rocephin due to SIRS as well as Tamiflu as he tested positive for type A influenza. He was placed on Dysphagia 1 nectar thick liquid diet per ST recommendations. Patient with difficulty with processing with easy distractibility, as well as problems with initiation. Needs tactile cues for basic tasks and mobility. Mentation improving and neurology recommends resuming Xarelto for embolic stroke due to A Fib. CIR recommended by rehab team and patient admitted today   Hospital Course: [name] was admitted to rehab 01/01/2014 for inpatient therapies to consist of PT, ST and OT at least three hours five days a week. Past  admission physiatrist, therapy team and rehab RN have worked together to provide customized collaborative inpatient rehab. Patient was noted to be lethargic and dehydrated at admission and was treated with IVF X 48 hours. IVF were changed to HS schedule as BUN/Cr normalized  He had improvement in mentation as well as endurance levels in 24 hours and was showing improved participation in therapies. Hematuria has been monitored and was showing improvement. He has had some drop in H/H due to continued hematuria.  Repeat UCS 01/02/14 showed multiple species. He had complaints of bilateral knee pain and Sportscreme has helped with symptoms.   On 01/05/14, patient was completing his OT session when he developed acute onset of speech difficulty, left gaze preference, RUE weakness as well as change in mental status. Code stroke was activated. He was evaluated by Dr. Nicole Kindred and  EEG was done at bedside. Patient was transported to [facility] and [facility] hospitalist were consulted to transfer patient to acute services.  Additional work up as well as changes in medications to be done by [facility].    Disposition:  Acute hospital.  Diet: NPO.     Future Appointments Provider Department Dept Phone   03/08/2014 10:15 AM Darlin Coco, MD [place] Office [phone]       Medication List    ASK your doctor about these medications       digoxin 0.125 MG tablet  Commonly known as:  LANOXIN  Take 1 tablet (0.125  mg total) by mouth daily.     diltiazem 300 MG 24 hr capsule  Commonly known as:  CARDIZEM CD  Take 1 capsule (300 mg total) by mouth daily.     PROBIOTIC DAILY PO  Take 1 tablet by mouth every morning.     RESOURCE THICKENUP CLEAR Powd  Take 120 g by mouth as needed.     Rivaroxaban 15 MG Tabs tablet  Commonly known as:  XARELTO  Take 1 tablet (15 mg total) by mouth daily.     Vitamin D 2000 UNITS tablet  Take 2,000-3,000 Units by mouth daily. Patient takes 2000 units daily, except for  taking 3000 units (1.5 tablets) on "winter days" and days when "not feeling well."         Signed: Bary Leriche 01/08/2014, 5:15 PM

## 2014-01-09 ENCOUNTER — Inpatient Hospital Stay (HOSPITAL_COMMUNITY)
Admission: RE | Admit: 2014-01-09 | Discharge: 2014-01-31 | DRG: 945 | Disposition: A | Discharge status: Home Health | Payer: Medicare Other | Source: Intra-hospital | Attending: Physical Medicine & Rehabilitation | Admitting: Physical Medicine & Rehabilitation

## 2014-01-09 DIAGNOSIS — E860 Dehydration: Secondary | ICD-10-CM | POA: Diagnosis present

## 2014-01-09 DIAGNOSIS — Z5189 Encounter for other specified aftercare: Principal | ICD-10-CM

## 2014-01-09 DIAGNOSIS — R4701 Aphasia: Secondary | ICD-10-CM | POA: Diagnosis present

## 2014-01-09 DIAGNOSIS — R279 Unspecified lack of coordination: Secondary | ICD-10-CM | POA: Diagnosis present

## 2014-01-09 DIAGNOSIS — G8190 Hemiplegia, unspecified affecting unspecified side: Secondary | ICD-10-CM | POA: Diagnosis present

## 2014-01-09 DIAGNOSIS — Z888 Allergy status to other drugs, medicaments and biological substances status: Secondary | ICD-10-CM | POA: Diagnosis not present

## 2014-01-09 DIAGNOSIS — R351 Nocturia: Secondary | ICD-10-CM | POA: Diagnosis present

## 2014-01-09 DIAGNOSIS — G9340 Encephalopathy, unspecified: Secondary | ICD-10-CM

## 2014-01-09 DIAGNOSIS — Z885 Allergy status to narcotic agent status: Secondary | ICD-10-CM

## 2014-01-09 DIAGNOSIS — Z8249 Family history of ischemic heart disease and other diseases of the circulatory system: Secondary | ICD-10-CM

## 2014-01-09 DIAGNOSIS — I6340 Cerebral infarction due to embolism of unspecified cerebral artery: Secondary | ICD-10-CM | POA: Diagnosis present

## 2014-01-09 DIAGNOSIS — E785 Hyperlipidemia, unspecified: Secondary | ICD-10-CM | POA: Diagnosis present

## 2014-01-09 DIAGNOSIS — J111 Influenza due to unidentified influenza virus with other respiratory manifestations: Secondary | ICD-10-CM | POA: Diagnosis present

## 2014-01-09 DIAGNOSIS — N390 Urinary tract infection, site not specified: Secondary | ICD-10-CM | POA: Diagnosis present

## 2014-01-09 DIAGNOSIS — I4891 Unspecified atrial fibrillation: Secondary | ICD-10-CM | POA: Diagnosis present

## 2014-01-09 DIAGNOSIS — Z8673 Personal history of transient ischemic attack (TIA), and cerebral infarction without residual deficits: Secondary | ICD-10-CM | POA: Diagnosis not present

## 2014-01-09 DIAGNOSIS — R32 Unspecified urinary incontinence: Secondary | ICD-10-CM | POA: Diagnosis present

## 2014-01-09 DIAGNOSIS — Z8261 Family history of arthritis: Secondary | ICD-10-CM

## 2014-01-09 DIAGNOSIS — H53469 Homonymous bilateral field defects, unspecified side: Secondary | ICD-10-CM | POA: Diagnosis present

## 2014-01-09 DIAGNOSIS — R1312 Dysphagia, oropharyngeal phase: Secondary | ICD-10-CM | POA: Diagnosis present

## 2014-01-09 DIAGNOSIS — F411 Generalized anxiety disorder: Secondary | ICD-10-CM | POA: Diagnosis present

## 2014-01-09 DIAGNOSIS — J449 Chronic obstructive pulmonary disease, unspecified: Secondary | ICD-10-CM | POA: Diagnosis present

## 2014-01-09 DIAGNOSIS — K5900 Constipation, unspecified: Secondary | ICD-10-CM | POA: Diagnosis present

## 2014-01-09 DIAGNOSIS — Z8551 Personal history of malignant neoplasm of bladder: Secondary | ICD-10-CM | POA: Diagnosis not present

## 2014-01-09 DIAGNOSIS — R7989 Other specified abnormal findings of blood chemistry: Secondary | ICD-10-CM | POA: Diagnosis present

## 2014-01-09 DIAGNOSIS — J4489 Other specified chronic obstructive pulmonary disease: Secondary | ICD-10-CM | POA: Diagnosis present

## 2014-01-09 DIAGNOSIS — Z882 Allergy status to sulfonamides status: Secondary | ICD-10-CM | POA: Diagnosis not present

## 2014-01-09 DIAGNOSIS — R414 Neurologic neglect syndrome: Secondary | ICD-10-CM | POA: Diagnosis present

## 2014-01-09 DIAGNOSIS — I639 Cerebral infarction, unspecified: Secondary | ICD-10-CM | POA: Diagnosis present

## 2014-01-09 DIAGNOSIS — I10 Essential (primary) hypertension: Secondary | ICD-10-CM | POA: Diagnosis present

## 2014-01-09 MED ORDER — DOXYCYCLINE HYCLATE 100 MG PO TABS: 100.0000 mg | ORAL_TABLET | Freq: Two times a day (BID) | ORAL | Status: AC

## 2014-01-09 MED ORDER — FLEET ENEMA 7-19 GM/118ML RE ENEM: 1.0000 | ENEMA | Freq: Once | RECTAL | Status: AC | PRN

## 2014-01-09 MED ORDER — KCL IN DEXTROSE-NACL 20-5-0.9 MEQ/L-%-% IV SOLN: INTRAVENOUS | Status: DC

## 2014-01-09 MED ORDER — DOXYCYCLINE HYCLATE 100 MG PO TABS
100.0000 mg | ORAL_TABLET | Freq: Two times a day (BID) | ORAL | Status: DC
  Administered 2014-01-09: 12:00:00 100 mg via ORAL
  Filled 2014-01-09 (×2): qty 1

## 2014-01-09 MED ORDER — SODIUM CHLORIDE 0.9 % IV SOLN
INTRAVENOUS | Status: DC
  Administered 2014-01-11 – 2014-01-17 (×8): via INTRAVENOUS
  Filled 2014-01-09 (×9): qty 1000

## 2014-01-09 MED ORDER — ONDANSETRON HCL 4 MG/2ML IJ SOLN: 4.0000 mg | Freq: Four times a day (QID) | INTRAMUSCULAR | Status: DC | PRN

## 2014-01-09 MED ORDER — KCL IN DEXTROSE-NACL 20-5-0.9 MEQ/L-%-% IV SOLN: 70.0000 mL/h | INTRAVENOUS | Status: DC

## 2014-01-09 MED ORDER — DIPHENHYDRAMINE HCL 12.5 MG/5ML PO ELIX: 12.5000 mg | ORAL_SOLUTION | Freq: Four times a day (QID) | ORAL | Status: DC | PRN

## 2014-01-09 MED ORDER — TRAZODONE HCL 50 MG PO TABS: 25.0000 mg | ORAL_TABLET | Freq: Every evening | ORAL | Status: DC | PRN

## 2014-01-09 MED ORDER — DILTIAZEM HCL ER COATED BEADS 180 MG PO CP24
180.0000 mg | ORAL_CAPSULE | Freq: Every day | ORAL | Status: DC
  Administered 2014-01-10 – 2014-01-12 (×3): 180 mg via ORAL
  Filled 2014-01-09 (×4): qty 1

## 2014-01-09 MED ORDER — DILTIAZEM HCL ER COATED BEADS 180 MG PO CP24
180.0000 mg | ORAL_CAPSULE | Freq: Every day | ORAL | Status: DC
Start: 2014-01-09 — End: 2014-01-09
  Administered 2014-01-09: 180 mg via ORAL
  Filled 2014-01-09: qty 1

## 2014-01-09 MED ORDER — ALUM & MAG HYDROXIDE-SIMETH 200-200-20 MG/5ML PO SUSP: 30.0000 mL | ORAL | Status: DC | PRN

## 2014-01-09 MED ORDER — ONDANSETRON HCL 4 MG PO TABS: 4.0000 mg | ORAL_TABLET | Freq: Four times a day (QID) | ORAL | Status: DC | PRN

## 2014-01-09 MED ORDER — SENNOSIDES-DOCUSATE SODIUM 8.6-50 MG PO TABS: 1.0000 | ORAL_TABLET | Freq: Every evening | ORAL | Status: DC | PRN

## 2014-01-09 MED ORDER — PROCHLORPERAZINE MALEATE 5 MG PO TABS
5.0000 mg | ORAL_TABLET | Freq: Four times a day (QID) | ORAL | Status: DC | PRN
  Filled 2014-01-09: qty 2

## 2014-01-09 MED ORDER — ACETAMINOPHEN 325 MG PO TABS: 325.0000 mg | ORAL_TABLET | ORAL | Status: DC | PRN

## 2014-01-09 MED ORDER — RESOURCE THICKENUP CLEAR PO POWD
ORAL | Status: DC | PRN
  Filled 2014-01-09: qty 125

## 2014-01-09 MED ORDER — CHLORHEXIDINE GLUCONATE 0.12 % MT SOLN
15.0000 mL | Freq: Two times a day (BID) | OROMUCOSAL | Status: DC
  Administered 2014-01-09 – 2014-01-31 (×39): 15 mL via OROMUCOSAL
  Filled 2014-01-09 (×46): qty 15

## 2014-01-09 MED ORDER — DOXYCYCLINE HYCLATE 100 MG PO TABS
100.0000 mg | ORAL_TABLET | Freq: Two times a day (BID) | ORAL | Status: AC
  Administered 2014-01-09 – 2014-01-14 (×10): 100 mg via ORAL
  Filled 2014-01-09 (×11): qty 1

## 2014-01-09 MED ORDER — PROCHLORPERAZINE EDISYLATE 5 MG/ML IJ SOLN
5.0000 mg | Freq: Four times a day (QID) | INTRAMUSCULAR | Status: DC | PRN
  Filled 2014-01-09: qty 2

## 2014-01-09 MED ORDER — DILTIAZEM HCL ER COATED BEADS 240 MG PO CP24: 240.0000 mg | ORAL_CAPSULE | Freq: Every day | ORAL | Status: DC

## 2014-01-09 MED ORDER — BIOTENE DRY MOUTH MT LIQD
15.0000 mL | Freq: Two times a day (BID) | OROMUCOSAL | Status: DC
  Administered 2014-01-11 – 2014-01-30 (×34): 15 mL via OROMUCOSAL

## 2014-01-09 MED ORDER — RIVAROXABAN 20 MG PO TABS
20.0000 mg | ORAL_TABLET | Freq: Every day | ORAL | Status: DC
  Filled 2014-01-09: qty 1

## 2014-01-09 MED ORDER — PROCHLORPERAZINE 25 MG RE SUPP
12.5000 mg | Freq: Four times a day (QID) | RECTAL | Status: DC | PRN
  Filled 2014-01-09: qty 1

## 2014-01-09 MED ORDER — BISACODYL 10 MG RE SUPP
10.0000 mg | Freq: Every day | RECTAL | Status: DC | PRN
  Filled 2014-01-09: qty 1

## 2014-01-09 MED ORDER — GUAIFENESIN-DM 100-10 MG/5ML PO SYRP: 5.0000 mL | ORAL_SOLUTION | Freq: Four times a day (QID) | ORAL | Status: DC | PRN

## 2014-01-09 NOTE — Progress Notes (Signed)
Pt arrived to unit at [facility] with wife at bedside. Wife already had rehab booklet and info with understanding process and safety plan. Pt in bed call bell within reach, bed alarm on, SRX3.

## 2014-01-09 NOTE — PMR Pre-admission (Signed)
PMR Admission Coordinator Pre-Admission Assessment  Patient: [name] is an 80 y.o., male MRN: [id number] DOB: 24-Sep-1927 Height: 6\' 2"  (188 cm) Weight: 56.79 kg (125 lb 3.2 oz) (bed scale)              Insurance Information HMO:     PPO: yes     PCP:      IPA:      80/20:      OTHER:  PRIMARY: A[employer] Medicare      Policy#: [id number]      Subscriber: [name]: [name]      Phone#: [phone]     Fax#: [id number] Pre-Cert#: [id number]      Employer: retired, follow up with onsite case manager [name] [phone] Benefits:  Phone #: [phone]     [name]: checked 12/31/13 Eff. Date: 12/27/13     Deduct: none      Out of Pocket Max: $4900     Life Max: none CIR: $345 per day (days 1-5)      SNF: no copay days 1-20;  $155/day (days 21-52); no copay (days 53-100) Outpatient: $40 per visit     Co-Pay: no visit limit. Home Health: 100%      Co-Pay: none, no visit limit DME: 80%     Co-Pay: 20% Providers: in network benefits  Emergency Contact Information Contact Information   [name] Relation Home Work Mobile   [name] [phone]  [phone]   [name] [phone]  [phone]     Current Medical History  Patient Admitting Diagnosis: L PCA, R hemiplegis, R homonymous hemianopsia, R hemisensory deficits  History of Present Illness: [name] is an 80 y.o. right handed male with history of COPD, prior CVA 08/2012 with right sided weakness and dysphagia ( CIR stay) , A fib--Xarelto and bladder cancer with ongoing treatment at [facility]. He was admitted 12/25/2013 with altered mental status and low grade fever of 100 Fahrenheit and reported hematuria. Cranial CT scan negative and MRI of the brain showed punctate acute infarct in the high left parietal lobe as well as extensive chronic ischemic changes. Patient's Xarelto held for a short time secondary to hematuria. He was started on IV rocephin due to SIRS as well as Tamiflu as he tested positive  for type A influenza. He was placed on Dysphagia 1 nectar thick liquid diet per ST recommendations. Patient with difficulty with processing with easy distractibility, as well as problems with initiation. Needs tactile cues for basic tasks and mobility. Mentation improving and neurology recommends resuming Xarelto for embolic stroke due to A Fib.  Admitted to [facility] 01/01/14  On 01/05/14, while on inpatient rehab,  he was completing his shower when he developed acute onset of right sided weakness with speech difficulty. Code stroke was initiated and he was transferred to acute services.   The patient's wife has noted increased problems with vision to the right side. Also has noted uncontrolled movements of the right upper extremity. EEG was negative for seizure. The patient was diagnosed with "Alien hand syndrome" however the symptoms have subsequently improved. Also diagnosed with CVA  (newly seen acute infarction within the left thalamus, left occipital lobe and left temporal lobe). Pattern of distribution most likely reflects left PCA territory infarction. Neurology following. IV heparin transitioned back to by mouth Xarelto 15 mg. SLP consult recommended dysphagia 1 diet. Please note there is a documented allergy to statins.  Pt. Was initially on IV cardiazem for A fib with  RVR but transitioned to PO once he passed swallow eval with SLP        Past Medical History  Past Medical History  Diagnosis Date  . NEOPLASM, MALIGNANT, BLADDER 04/10/2009  . HYPERLIPIDEMIA 04/10/2009  . ANEMIA-NOS 04/10/2009  . 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 ANXIETY 04/10/2009  . HYPERTENSION 04/10/2009  . Atrial fibrillation 04/10/2009  . COPD 04/10/2009  . TRANSIENT ISCHEMIC ATTACK, HX OF 04/10/2009  . Bladder cancer     Dr. Lawerance [name]  . Lacunar stroke 04/07/2011  . Pleural effusion 04/07/2011  . 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 Stroke     Family History  family history includes Arthritis in his father;  Heart disease in his mother.  Prior Rehab/Hospitalizations: Acute hospitalization 12/25/2013 till 01/01/14, CIR from 1/6-1/10/15, at which time he returned to acute. He also had a CIR admission 8/13.  Current Medications  Current facility-administered medications:dextrose 5 % and 0.9 % NaCl with KCl 20 mEq/L infusion, , Intravenous, Continuous, Leonard Dubois, [name], Last Rate: 20 mL/hr at 01/09/14 1000, 20 mL/hr at 01/09/14 1000;  diltiazem (CARDIZEM CD) 24 hr capsule 180 mg, 180 mg, Oral, Daily, Leonard Breeding, [name], 180 mg at 01/09/14 1134;  doxycycline (VIBRA-TABS) tablet 100 mg, 100 mg, Oral, Q12H, Leonard Dubois, [name], 100 mg at 01/09/14 1134 ondansetron (ZOFRAN) injection 4 mg, 4 mg, Intravenous, Q6H PRN, Leonard Poisson, [name];  ondansetron (ZOFRAN) tablet 4 mg, 4 mg, Oral, Q6H PRN, Leonard Poisson, [name];  RESOURCE THICKENUP CLEAR, , Oral, PRN, Leonard Marisa Hua, [name];  Rivaroxaban (XARELTO) tablet 20 mg, 20 mg, Oral, Q supper, [name], [name], 20 mg at 01/08/14 1214;  sodium chloride 0.9 % injection 3 mL, 3 mL, Intravenous, Q12H, Leonard Poisson, [name], 3 mL at 01/09/14 1100  Patients Current Diet: Dysphagia 1 honey thick  Precautions / Restrictions Precautions Precautions: Fall Precaution Comments: flu withr recent admission, now resolved Restrictions Weight Bearing Restrictions: No   Prior Activity Level Community (5-7x/wk): out of the house daily, drives "farm truckArchivist / [place] Devices/Equipment: None Home Equipment: Shower seat;Hand held shower head  Prior Functional Level Prior Function Level of Independence: Independent Comments: wife assists with setting out clothes. Pt has bladder incontinence  Current Functional Level Cognition  Overall Cognitive Status: Difficult to assess Difficult to assess due to: Impaired communication Current Attention Level: Sustained Orientation Level: Oriented to person;Oriented to place;Disoriented to time;Oriented to  situation Following Commands: Follows multi-step commands inconsistently Safety/Judgement: Decreased awareness of deficits;Decreased awareness of safety General Comments: pt is much more alert and oriented today, better command following and improved awareness and attention.     Extremity Assessment (includes Sensation/Coordination)          ADLs  Grooming: Performed;Wash/dry hands;Wash/dry face;Minimal assistance;Other (comment) (difficulty with R UE initiation/coordination) Upper Body Bathing: Simulated;Moderate assistance Where Assessed - Upper Body Bathing: Supported sitting Lower Body Bathing: Simulated;Maximal assistance Upper Body Dressing: Performed;Moderate assistance Where Assessed - Upper Body Dressing: Supported sitting Lower Body Dressing: Maximal assistance Toilet Transfer Method: Sit to stand Toileting - Clothing Manipulation and Hygiene: Maximal assistance Transfers/Ambulation Related to ADLs: sit -stand at EOB with mod - min A for balance/support    Mobility        Mobility  Bed Mobility  Overal bed mobility: Needs Assistance  Bed Mobility: Supine to Sit  Rolling: Mod assist  Supine to sit: Min assist  Sit to supine: Mod assist  General bed mobility comments: Pt needed assist for moving LEs off bed as well  as to elevate trunk.  Transfers  Overall transfer level: Needs assistance  Equipment used: 2 person hand held assist  Transfers: Sit to/from Stand  Sit to Stand: +2 physical assistance;Mod assist;Min assist  General transfer comment: sit -stand at EOB with mod - min A for balance/support. Pt needed assist for hip and knee extension as pt slightly flexed at hips, knees and trunk. Pt needed constant cues for full upright posture. Pt was able to stand close to fully upright and was standing better today than yesterday. Pt holding onto chair with left UE and right UE being held in place by PT. Performed some pre gait activities by pt stepping forward and backward.  Definitely needed mod assist for support of trunk for stepping.                 Posture / Balance Dynamic Sitting Balance Sitting balance - Comments: Sat EOB needing min guard to min assist if challenged.  Pt with poor coordination of entire right hemibody with some ? inattention to right side.      Special needs/care consideration BiPAP/CPAP none CPM none Continuous Drip IV  yes Dialysis none        Days none Life Vest none Oxygen none Special Bed none Trach Size none Wound Vac   none Skin no issues                              Location Bowel mgmt: [place] Bladder mgmt: incontinent of urine with h/o bladder cancer; wears depends during day and condom cath at night      Previous Home Environment Living Arrangements: [name]/significant other  Lives With: [name] Available Help at Discharge: Available 24 hours/day Type of Home: House Home Layout: One level Home Access: Stairs to enter Entrance Stairs-Rails: Right Entrance Stairs-Number of Steps: 4 Bathroom Shower/Tub: Tub/shower unit;Door Bathroom Toilet: Standard Bathroom Accessibility: Yes Home Care Services: No Additional Comments: tub shower.   Discharge Living Setting Plans for Discharge Living Setting: Patient's home Type of Home at Discharge: House Discharge Home Layout: One level Discharge Home Access: Stairs to enter Entrance Stairs-Rails: Right Entrance Stairs-Number of Steps: 4 Does the patient have any problems obtaining your medications?: No  Social/Family/Support Systems Patient Roles: [name] Contact Information: wife, Wilder Kurowski,  Anticipated Caregiver: wife Anticipated Caregiver's Contact Information: [phone] cell, [phone] home Ability/Limitations of Caregiver: none Caregiver Availability: 24/7 Discharge Plan Discussed with Primary Caregiver: Yes Is Caregiver In Agreement with Plan?: Yes Does Caregiver/Family have Issues with Lodging/Transportation while Pt is in Rehab?:  No    Goals/Additional Needs Patient/Family Goal for Rehab: PT/OT supervision to min assist, SLP  Expected length of stay: about 2 weeks Cultural Considerations: none Dietary Needs: dysphagia 1, honey thick liquids Special Service Needs: none Pt/Family Agrees to Admission and willing to participate: Yes Program Orientation Provided & Reviewed with Pt/Caregiver Including Roles  & Responsibilities: Yes Additional Information Needs: Pt. recently on CIR unit, transferred back to acute on 01/05/14  Possible need for SNF placement upon discharge: not anticipated   Patient Condition :  Discussed case with Dr. Letta Pate and he agrees that   the patient's condition is appropriate for intensive rehabilitative care in an inpatient rehabilitation facility. Will re- admit to inpatient rehab today.  Preadmission Screen Completed By:  Gerlean Ren, 01/09/2014 3:59 PM ______________________________________________________________________   Discussed status with Dr. Letta Pate on 01/09/14 at 1555 and received telephone approval for admission today.  Admission Coordinator:  Manuela Schwartz  Hildreth Robart,PT  Time [id number] /Date 01/09/14.

## 2014-01-09 NOTE — Progress Notes (Signed)
Patient seen and examined. Stable and ready to go back to CIR. Will continue medication reconciliation and rec's as outlined on Discharge summary by Dr. Verlon Au on 01/08/13; with exemption on treatment for his UTI which will provide use doxycycline and complete a total of ten days antibiotic treatment; no further changes made to discharge summary.  [name] [phone]

## 2014-01-09 NOTE — Consult Note (Signed)
01/09/2014, 7:59 PM  Short Note:   Noted that patient on Orbit-AF II study for Treatment of Atrial Fibrillation (ORBIT-AF II) is designed to evaluate the utilization of target-specific antithrombotic agents, such as factor Xa (FXa) inhibitors and direct thrombin inhibitors  Patient has already received Xarelto 20 mg dose prior to transfer to ReHab unit today.    Pharmacy will review history, dosages, clinical picture and adjust doses as indicated.  Thank you.  Nykayla Marcelli, Craig Guess,  Pharm.D.

## 2014-01-09 NOTE — Progress Notes (Signed)
    SUBJECTIVE:  Indicates no pain.  He is nonverbal.  Bit of a cough   PHYSICAL EXAM Filed Vitals:   01/08/14 1635 01/08/14 2046 01/08/14 2351 01/09/14 0527  BP: 112/64 101/54 140/76 115/77  Pulse: 84 95  100  Temp: 98.3 F (36.8 C) 97.4 F (36.3 C)  98.2 F (36.8 C)  TempSrc: Oral Oral  Oral  Resp: 18 18  18   Height:      Weight:    125 lb 3.2 oz (56.79 kg)  SpO2: 99% 95%  96%   General:  Frail but no distress Lungs:  Clear Heart:  Irregular Abdomen:  Positive bowel sounds, no rebound no guarding Extremities:  No edema  LABS:  Results for orders placed during the hospital encounter of 01/05/14 (from the past 24 hour(s))  GLUCOSE, CAPILLARY     Status: Abnormal   Collection Time    01/08/14 11:01 AM      Result Value Range   Glucose-Capillary 116 (*) 70 - 99 mg/dL  GLUCOSE, CAPILLARY     Status: Abnormal   Collection Time    01/08/14  5:20 PM      Result Value Range   Glucose-Capillary 125 (*) 70 - 99 mg/dL    Intake/Output Summary (Last 24 hours) at 01/09/14 0840 Last data filed at 01/09/14 0541  Gross per 24 hour  Intake    720 ml  Output   1275 ml  Net   -555 ml    ASSESSMENT AND PLAN:  CVA:  Creat clearance is 56.4.   He would qualify for the 20 mg dose of Xarelto.  However, the failure of this med could be because it was held for a short while for hematuria.  At this point I think that it is most prudent to continue the 15 mg dose.   I have discussed this with the patient and his wife.  He did have some hematuria.  However, this has cleared this morning.    ATRIAL FIB:  Now on PO Cardizem. Rate is OK.  I will consolidate to XL but reduce the dose as his BP is somewhat low.   Jeneen Rinks [facility] 01/09/2014 8:40 AM

## 2014-01-09 NOTE — Progress Notes (Signed)
Pt has small amount of blood in his urine tonight with no clots. Pt wife states his urine looked similar when he was on Xarelto before admission. MD on call paged via [facility] text page. Will continue to monitor. Ronnette Hila, RN

## 2014-01-09 NOTE — H&P (View-Only) (Signed)
Physical Medicine and Rehabilitation Admission H&P    CC:  Right sided weakness, speech difficulties, problems swallowing.    HPI:  [name] is a 80 y.o. right handed male with history of COPD, prior CVA 08/2012 with right sided weakness and dysphagia ( CIR stay) , A fib--Xarelto and bladder cancer with ongoing treatment at [facility]. He was admitted 12/25/2013 with altered mental status and low grade fever of 100 Fahrenheit and reported hematuria. Cranial CT scan negative and MRI of the brain showed punctate acute infarct in the high left parietal lobe as well as extensive chronic ischemic changes. Patient's Xarelto held for a short time secondary to hematuria. He was started on IV rocephin due to SIRS as well as Tamiflu as he tested positive for type A influenza. He was placed on Dysphagia 1 nectar thick liquid diet per ST recommendations. Patient with difficulty with processing with easy distractibility, as well as problems with initiation. Needs tactile cues for basic tasks and mobility. Mentation improving and neurology recommends resuming Xarelto for embolic stroke due to A Fib  On 01/05/14, he was completing his shower when he developed acute onset of right sided weakness with speech difficulty. Code stroke was initiated and he was transferred to acute services.   The patient's wife has noted increased problems with vision to the right side. Also has noted uncontrolled movements of the right upper extremity. EEG was negative for seizure. The patient was diagnosed with "Alien hand syndrome" however the symptoms have subsequently improved  Appetite is good, remains on D1 diet Occasional cough ROS Past Medical History  Diagnosis Date  . NEOPLASM, MALIGNANT, BLADDER 04/10/2009  . HYPERLIPIDEMIA 04/10/2009  . ANEMIA-NOS 04/10/2009  . 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 ANXIETY 04/10/2009  . HYPERTENSION 04/10/2009  . Atrial fibrillation 04/10/2009  . COPD 04/10/2009  . TRANSIENT ISCHEMIC  ATTACK, HX OF 04/10/2009  . Bladder cancer     Dr. Lawerance Bach  . Lacunar stroke 04/07/2011  . Pleural effusion 04/07/2011  . 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 Stroke    Past Surgical History  Procedure Laterality Date  . Splenectomy    . Bilateral vats ablation    . Facial cancer      facial skin cancer   Family History  Problem Relation Age of Onset  . Heart disease Mother   . Arthritis Father    Social History:  reports that he has never smoked. He has never used smokeless tobacco. He reports that he does not drink alcohol or use illicit drugs. Allergies:  Allergies  Allergen Reactions  . Diazepam     REACTION: agitation  . Ezetimibe-Simvastatin     unknown  . Morphine Other (See Comments)    Hallucinations.  . Sulfonamide Derivatives Hives   Medications Prior to Admission  Medication Sig Dispense Refill  . Cholecalciferol (VITAMIN D) 2000 UNITS tablet Take 2,000-3,000 Units by mouth daily. Patient takes 2000 units daily, except for taking 3000 units (1.5 tablets) on "winter days" and days when "not feeling well."      . digoxin (LANOXIN) 0.125 MG tablet Take 1 tablet (0.125 mg total) by mouth daily.  30 tablet  5  . diltiazem (CARDIZEM CD) 300 MG 24 hr capsule Take 1 capsule (300 mg total) by mouth daily.  30 capsule  0  . Maltodextrin-Xanthan Gum (RESOURCE THICKENUP CLEAR) POWD Take 120 g by mouth as needed.  10 Can  0  . Probiotic Product (PROBIOTIC DAILY PO) Take 1 tablet by mouth every morning.       Marland Kitchen  Rivaroxaban (XARELTO) 15 MG TABS tablet Take 1 tablet (15 mg total) by mouth daily.  30 tablet  12    Home: Home Living Family/patient expects to be discharged to:: Inpatient rehab Living Arrangements: Spouse/significant other Available Help at Discharge: Available 24 hours/day Type of Home: House Home Access: Stairs to enter Entrance Stairs-Number of Steps: 4 Entrance Stairs-Rails: Right Home Layout: One level Home Equipment: Shower seat;Hand held shower  head Additional Comments: tub shower.   Lives With: Spouse   Functional History: Prior Function Comments: wife assists with setting out clothes. Pt has bladder incontinence  Functional Status:  Mobility:          ADL: ADL Grooming: Performed;Wash/dry hands;Wash/dry face;Minimal assistance;Other (comment) (difficulty with R UE initiation/coordination) Upper Body Bathing: Simulated;Moderate assistance Where Assessed - Upper Body Bathing: Supported sitting Lower Body Bathing: Simulated;Maximal assistance Upper Body Dressing: Performed;Moderate assistance Where Assessed - Upper Body Dressing: Supported sitting Lower Body Dressing: Maximal assistance Toilet Transfer Method: Sit to stand Transfers/Ambulation Related to ADLs: sit -stand at EOB with mod - min A for balance/support  Cognition: Cognition Overall Cognitive Status: Difficult to assess Orientation Level: Oriented X4 Cognition Arousal/Alertness: Awake/alert Behavior During Therapy: WFL for tasks assessed/performed Overall Cognitive Status: Difficult to assess Area of Impairment: Memory;Awareness;Problem solving Orientation Level: Disoriented to;Situation Current Attention Level: Sustained Memory: Decreased short-term memory Following Commands: Follows multi-step commands inconsistently Safety/Judgement: Decreased awareness of deficits;Decreased awareness of safety Awareness: Emergent Problem Solving: Slow processing;Difficulty sequencing;Requires verbal cues;Requires tactile cues General Comments: pt is much more alert and oriented today, better command following and improved awareness and attention.  Difficult to assess due to: Impaired communication  Physical Exam: Blood pressure 112/64, pulse 84, temperature 98.3 F (36.8 C), temperature source Oral, resp. rate 18, height 6' 2" (1.88 m), weight 58.1 kg (128 lb 1.4 oz), SpO2 99.00%. Physical Exam  Nursing note and vitals reviewed. Constitutional: He appears  well-developed and well-nourished.  HENT:  Head: Normocephalic and atraumatic.  Eyes: Conjunctivae and EOM are normal. Pupils are equal, round, and reactive to light.  Neck: Normal range of motion. Neck supple.  Cardiovascular: Normal rate and normal heart sounds.  An irregularly irregular rhythm present.  Respiratory: Effort normal. No respiratory distress. He has decreased breath sounds in the right lower field and the left lower field.  Neurological: He is alert. He exhibits normal muscle tone.  Motor strength is 3 minus right deltoid, bicep, tricep, grip, right ankle dorsiflexor 4 minus right hip flexor knee extensor 5/5 left deltoid, bicep, tricep, grip, hip flexors, knee extensors, ankle dorsiflexor plantar flexor  Dysmetria right finger nose to finger testing  Visual field diminished right temporal with confrontational testing  Absent sensation to light touch in the right upper extremity withdraws to pinch right upper extremity Intact sensation to light touch right lower extremity as well as left-sided  Skin: Skin is warm, dry and intact.  Psychiatric: His affect is blunt. His speech is delayed. He is slowed. Cognition and memory are impaired. He does not express impulsivity or inappropriate judgment. He is inattentive.    Results for orders placed during the hospital encounter of 01/05/14 (from the past 48 hour(s))  HEPARIN LEVEL (UNFRACTIONATED)     Status: None   Collection Time    01/06/14  6:54 PM      Result Value Range   Heparin Unfractionated 0.36  0.30 - 0.70 IU/mL   Comment:            IF HEPARIN RESULTS ARE BELOW       EXPECTED VALUES, AND PATIENT     DOSAGE HAS BEEN CONFIRMED,     SUGGEST FOLLOW UP TESTING     OF ANTITHROMBIN III LEVELS.  APTT     Status: Abnormal   Collection Time    01/06/14  6:54 PM      Result Value Range   aPTT 59 (*) 24 - 37 seconds   Comment:            IF BASELINE aPTT IS ELEVATED,     SUGGEST PATIENT RISK ASSESSMENT     BE USED TO  DETERMINE APPROPRIATE     ANTICOAGULANT THERAPY.  GLUCOSE, CAPILLARY     Status: Abnormal   Collection Time    01/06/14  7:31 PM      Result Value Range   Glucose-Capillary 112 (*) 70 - 99 mg/dL  GLUCOSE, CAPILLARY     Status: Abnormal   Collection Time    01/06/14 11:35 PM      Result Value Range   Glucose-Capillary 112 (*) 70 - 99 mg/dL   Comment 1 Notify RN    GLUCOSE, CAPILLARY     Status: None   Collection Time    01/07/14  4:40 AM      Result Value Range   Glucose-Capillary 88  70 - 99 mg/dL   Comment 1 Notify RN    CBC     Status: Abnormal   Collection Time    01/07/14  4:45 AM      Result Value Range   WBC 4.3  4.0 - 10.5 K/uL   RBC 2.89 (*) 4.22 - 5.81 MIL/uL   Hemoglobin 9.5 (*) 13.0 - 17.0 g/dL   HCT 27.3 (*) 39.0 - 52.0 %   MCV 94.5  78.0 - 100.0 fL   MCH 32.9  26.0 - 34.0 pg   MCHC 34.8  30.0 - 36.0 g/dL   RDW 13.9  11.5 - 15.5 %   Platelets 313  150 - 400 K/uL  COMPREHENSIVE METABOLIC PANEL     Status: Abnormal   Collection Time    01/07/14  4:45 AM      Result Value Range   Sodium 136 (*) 137 - 147 mEq/L   Potassium 3.8  3.7 - 5.3 mEq/L   Chloride 98  96 - 112 mEq/L   CO2 27  19 - 32 mEq/L   Glucose, Bld 90  70 - 99 mg/dL   BUN 11  6 - 23 mg/dL   Creatinine, Ser 0.77  0.50 - 1.35 mg/dL   Calcium 8.4  8.4 - 10.5 mg/dL   Total Protein 6.2  6.0 - 8.3 g/dL   Albumin 2.3 (*) 3.5 - 5.2 g/dL   AST 18  0 - 37 U/L   ALT 10  0 - 53 U/L   Alkaline Phosphatase 67  39 - 117 U/L   Total Bilirubin 0.2 (*) 0.3 - 1.2 mg/dL   GFR calc non Af Amer 81 (*) >90 mL/min   GFR calc Af Amer >90  >90 mL/min   Comment: (NOTE)     The eGFR has been calculated using the CKD EPI equation.     This calculation has not been validated in all clinical situations.     eGFR's persistently <90 mL/min signify possible Chronic Kidney     Disease.  MAGNESIUM     Status: None   Collection Time    01/07/14  4:45 AM      Result Value Range     Magnesium 1.9  1.5 - 2.5 mg/dL  HEPARIN  LEVEL (UNFRACTIONATED)     Status: Abnormal   Collection Time    01/07/14  4:45 AM      Result Value Range   Heparin Unfractionated 0.28 (*) 0.30 - 0.70 IU/mL   Comment:            IF HEPARIN RESULTS ARE BELOW     EXPECTED VALUES, AND PATIENT     DOSAGE HAS BEEN CONFIRMED,     SUGGEST FOLLOW UP TESTING     OF ANTITHROMBIN III LEVELS.  GLUCOSE, CAPILLARY     Status: None   Collection Time    01/07/14  8:32 AM      Result Value Range   Glucose-Capillary 92  70 - 99 mg/dL  GLUCOSE, CAPILLARY     Status: Abnormal   Collection Time    01/07/14 12:01 PM      Result Value Range   Glucose-Capillary 102 (*) 70 - 99 mg/dL  GLUCOSE, CAPILLARY     Status: Abnormal   Collection Time    01/07/14  4:31 PM      Result Value Range   Glucose-Capillary 102 (*) 70 - 99 mg/dL  HEPARIN LEVEL (UNFRACTIONATED)     Status: None   Collection Time    01/07/14  8:35 PM      Result Value Range   Heparin Unfractionated 0.31  0.30 - 0.70 IU/mL   Comment:            IF HEPARIN RESULTS ARE BELOW     EXPECTED VALUES, AND PATIENT     DOSAGE HAS BEEN CONFIRMED,     SUGGEST FOLLOW UP TESTING     OF ANTITHROMBIN III LEVELS.  HEPARIN LEVEL (UNFRACTIONATED)     Status: None   Collection Time    01/08/14  4:10 AM      Result Value Range   Heparin Unfractionated 0.30  0.30 - 0.70 IU/mL   Comment:            IF HEPARIN RESULTS ARE BELOW     EXPECTED VALUES, AND PATIENT     DOSAGE HAS BEEN CONFIRMED,     SUGGEST FOLLOW UP TESTING     OF ANTITHROMBIN III LEVELS.  CBC     Status: Abnormal   Collection Time    01/08/14  4:10 AM      Result Value Range   WBC 5.4  4.0 - 10.5 K/uL   RBC 2.99 (*) 4.22 - 5.81 MIL/uL   Hemoglobin 9.8 (*) 13.0 - 17.0 g/dL   HCT 28.4 (*) 39.0 - 52.0 %   MCV 95.0  78.0 - 100.0 fL   MCH 32.8  26.0 - 34.0 pg   MCHC 34.5  30.0 - 36.0 g/dL   RDW 14.0  11.5 - 15.5 %   Platelets 311  150 - 400 K/uL  GLUCOSE, CAPILLARY     Status: Abnormal   Collection Time    01/08/14 11:01 AM       Result Value Range   Glucose-Capillary 116 (*) 70 - 99 mg/dL   Dg Swallowing Func-speech Pathology  01/07/2014   Lori T Willis, CCC-SLP     01/07/2014 11:57 AM Objective Swallowing Evaluation: Modified Barium Swallowing Study   Patient Details  Name: [name] MRN: [id number] Date of Birth: 03/13/1927  Today's Date: 01/07/2014 Time: 1105-1145 SLP Time Calculation (min): 40 min  Past Medical History:  Past Medical History  Diagnosis Date  .   NEOPLASM, MALIGNANT, BLADDER 04/10/2009  . HYPERLIPIDEMIA 04/10/2009  . ANEMIA-NOS 04/10/2009  . 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 ANXIETY 04/10/2009  . HYPERTENSION 04/10/2009  . Atrial fibrillation 04/10/2009  . COPD 04/10/2009  . TRANSIENT ISCHEMIC ATTACK, HX OF 04/10/2009  . Bladder cancer     Dr. Lawerance Bach  . Lacunar stroke 04/07/2011  . Pleural effusion 04/07/2011  . 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 Stroke    Past Surgical History:  Past Surgical History  Procedure Laterality Date  . Splenectomy    . Bilateral vats ablation    . Facial cancer      facial skin cancer   HPI:  80 y.o. male with hx of bladder cancer, undergoing new treatment  at outside facility, hx of afib on Xarelto (Dr Mare Ferrari), prior  CVA, HTN, hyperlipidemia, s/p splenectomy.  Admitted with  acute  encephalopathy, likely secondary to influenza and questionable  UTI; SIRS, hematuria.  Has been on dysphagia 3 diet with crushed  meds since admission - now with difficulty swallowing. MRI: Newly  seen acute infarction within the left thalamus, left occipital      Assessment / Plan / Recommendation Clinical Impression  Dysphagia Diagnosis: Moderate oral phase dysphagia;Moderate  pharyngeal phase dysphagia Clinical impression: Pt. exhibits a moderate oropharyngeal  dysphagia as evidenced by decreased lingual strength and  coordination for bolus propulsion, decreased base of tongue  contraction to the pharyngeal wall, decreased anterior movement  of the hyoid, and decreased laryngeal elevation, resulting in   positive aspiration of nectar thick liquids with a delayed and  ineffective cough response, and moderate vallecular residue after  the swallow.  Pt. was able to clear most residue with moderate  verbal cues to "swallow again."    Treatment Recommendation  Therapy as outlined in treatment plan below    Diet Recommendation Dysphagia 1 (Puree);Honey-thick liquid   Liquid Administration via: Cup;No straw Medication Administration: Whole meds with puree Supervision: Full supervision/cueing for compensatory strategies Compensations: Slow rate;Small sips/bites Postural Changes and/or Swallow Maneuvers: Seated upright 90  degrees    Other  Recommendations Oral Care Recommendations: Oral care BID Other Recommendations: Order thickener from pharmacy;Clarify  dietary restrictions;Have oral suction available   Follow Up Recommendations  Inpatient Rehab    Frequency and Duration min 2x/week  2 weeks   Pertinent Vitals/Pain Afebrile; LS diminished.    SLP Swallow Goals     General Date of Onset: 12/25/13 HPI: 80 y.o. male with hx of bladder cancer, undergoing new  treatment at outside facility, hx of afib on Xarelto (Dr  Mare Ferrari), prior CVA, HTN, hyperlipidemia, s/p splenectomy.   Admitted with  acute encephalopathy, likely secondary to  influenza and questionable UTI; SIRS, hematuria.  Has been on  dysphagia 3 diet with crushed meds since admission - now with  difficulty swallowing. MRI: Newly seen acute infarction within  the left thalamus, left occipital  Type of Study: Modified Barium Swallowing Study Reason for Referral: Objectively evaluate swallowing function Previous Swallow Assessment: 08/18/12 MBS Clinical impression: Mr.  Wilhelmsen presents with a mild oral dyspahgia with mild oral  residual due to left labial and lingual weakness. More signficant  oropharyngeal dysphagia with sensory/motor deficits leading to a  delay in swallow initiaton and silent aspriaiton of thin liquids  before the swallow. Furthermore weakness of  base of tongue and  hyolaryngeal complex results in mild to moderate residual that  necesitates verbal cues for a second swallow to clear. Pt is a  moderate aspiration risk Diet Prior  to this Study: NPO Temperature Spikes Noted: No Respiratory Status: Room air History of Recent Intubation: No Behavior/Cognition: Alert;Cooperative;Requires cueing;Decreased  sustained attention;Distractible Oral Cavity - Dentition: Adequate natural dentition Oral Motor / Sensory Function: Impaired motor Oral impairment: Right lingual;Right labial Self-Feeding Abilities: Needs assist Patient Positioning: Upright in chair Baseline Vocal Quality: Low vocal intensity Volitional Cough: Weak;Wet;Congested Volitional Swallow: Unable to elicit Anatomy: Within functional limits Pharyngeal Secretions: Not observed secondary MBS    Reason for Referral Objectively evaluate swallowing function   Oral Phase Oral Preparation/Oral Phase Oral Phase: Impaired Oral - Solids Oral - Puree: Weak lingual manipulation;Lingual  pumping;Incomplete tongue to palate contact;Reduced posterior  propulsion;Holding of bolus;Delayed oral transit Oral - Pill: Weak lingual manipulation;Lingual pumping;Reduced  posterior propulsion;Holding of bolus;Delayed oral transit   Pharyngeal Phase Pharyngeal Phase Pharyngeal Phase: Impaired Pharyngeal - Honey Pharyngeal - Honey Cup: Delayed swallow initiation;Premature  spillage to valleculae;Reduced anterior laryngeal  mobility;Reduced laryngeal elevation;Reduced tongue base  retraction;Pharyngeal residue - valleculae Pharyngeal - Nectar Pharyngeal - Nectar Teaspoon: Delayed swallow initiation;Reduced  epiglottic inversion;Reduced anterior laryngeal mobility;Reduced  laryngeal elevation;Reduced airway/laryngeal closure;Reduced  tongue base retraction;Penetration/Aspiration during  swallow;Moderate aspiration;Pharyngeal residue - valleculae Penetration/Aspiration details (nectar teaspoon): Material enters  airway, passes BELOW  cords and not ejected out despite cough  attempt by patient Pharyngeal - Solids Pharyngeal - Puree: Delayed swallow initiation;Premature spillage  to valleculae;Reduced anterior laryngeal mobility;Reduced  laryngeal elevation;Reduced tongue base retraction;Pharyngeal  residue - valleculae;Compensatory strategies attempted (Comment)  (Most clears with repeat dry swallow)  Cervical Esophageal Phase    GO    Cervical Esophageal Phase Cervical Esophageal Phase: Silverio Lay T 01/07/2014, 11:56 AM     Post Admission Physician Evaluation: 1. Functional deficits secondary  to left posterior cerebral artery infarct with right hemiparesis, right homonymous hemianopsia, right hemisensory deficits. 2. Patient is admitted to receive collaborative, interdisciplinary care between the physiatrist, rehab nursing staff, and therapy team. 3. Patient's level of medical complexity and substantial therapy needs in context of that medical necessity cannot be provided at a lesser intensity of care such as a SNF. 4. Patient has experienced substantial functional loss from his/her baseline which was documented above under the "Functional History" and "Functional Status" headings.  Judging by the patient's diagnosis, physical exam, and functional history, the patient has potential for functional progress which will result in measurable gains while on inpatient rehab.  These gains will be of substantial and practical use upon discharge  in facilitating mobility and self-care at the household level. 5. Physiatrist will provide 24 hour management of medical needs as well as oversight of the therapy plan/treatment and provide guidance as appropriate regarding the interaction of the two. 6. 24 hour rehab nursing will assist with bladder management, bowel management, safety, skin/wound care, disease management, medication administration, pain management and patient education  and help integrate therapy concepts,  techniques,education, etc. 7. PT will assess and treat for/with: pre gait, gait training, endurance , safety, equipment, neuromuscular re education.   Goals are: Supervison/ Min A mobility. 8. OT will assess and treat for/with: ADLs, Cognitive perceptual skills, Neuromuscular re education, safety, endurance, equipment.   Goals are: Sup/MinA. 9. SLP will assess and treat for/with: Attend to right side.Dysphagia  Goals are: compensate for field cut, safe po intake normal consistencies. 10. Case Management and Social Worker will assess and treat for psychological issues and discharge planning. 11. Team conference will be held weekly to assess progress toward goals and to determine  barriers to discharge. 12. Patient will receive at least 3 hours of therapy per day at least 5 days per week. 13. ELOS: 19-22 days       14. Prognosis:  good   Medical Problem List and Plan: 1. DVT Prophylaxis/Anticoagulation: Pharmaceutical: Xarelto 2. Pain Management: tyelnol 3. Mood: monitor for depression, encourage interaction 4. Neuropsych: This patient is not capable of making decisions on his own behalf. 5.  Afib Xarelto 6.  Resp- recent Influenza and hx COPD- IS,monitor     Randee Upchurch E. Zedric Deroy M.D.  Physical Med and Rehab FAAPM&R (Sports Med, Neuromuscular Med) Diplomate Am Board of Electrodiagnostic Med Diplomate Am Board of Pain Medicine Fellow Am Board of Interventional Pain Physicians  01/08/2014 

## 2014-01-09 NOTE — Progress Notes (Signed)
I have insurance approval and plan to admit pt to inpt rehab today. Pt's wife at bedside and is in agreement to readmission. I have notified Dr. Dyann Kief and we ill arrange for today. [phone]

## 2014-01-09 NOTE — Interval H&P Note (Signed)
[name] was admitted today to Inpatient Rehabilitation with the diagnosis of left PCA infarct.  The patient's history has been reviewed, patient examined, and there is no change in status.  Patient continues to be appropriate for intensive inpatient rehabilitation.  I have reviewed the patient's chart and labs.  Questions were answered to the patient's satisfaction.  [name] T 01/09/2014, 8:40 PM

## 2014-01-09 NOTE — Progress Notes (Addendum)
Called report to [facility] for in pt rehab spoke with Metallurgist

## 2014-01-09 NOTE — Progress Notes (Signed)
Pt discharged  per bed with all personal belongings to in pt rehab [place] bed 08. Wife and RN accompanied pt.

## 2014-01-10 ENCOUNTER — Inpatient Hospital Stay (HOSPITAL_COMMUNITY): Payer: Medicare Other

## 2014-01-10 ENCOUNTER — Inpatient Hospital Stay (HOSPITAL_COMMUNITY): Payer: Medicare Other | Admitting: Physical Therapy

## 2014-01-10 DIAGNOSIS — R5381 Other malaise: Secondary | ICD-10-CM

## 2014-01-10 LAB — CBC WITH DIFFERENTIAL/PLATELET
BASOS ABS: 0 10*3/uL (ref 0.0–0.1)
BASOS PCT: 0 % (ref 0–1)
EOS ABS: 0.1 10*3/uL (ref 0.0–0.7)
Eosinophils Relative: 1 % (ref 0–5)
HCT: 29.5 % — ABNORMAL LOW (ref 39.0–52.0)
Hemoglobin: 10 g/dL — ABNORMAL LOW (ref 13.0–17.0)
Lymphocytes Relative: 15 % (ref 12–46)
Lymphs Abs: 0.9 10*3/uL (ref 0.7–4.0)
MCH: 32.9 pg (ref 26.0–34.0)
MCHC: 33.9 g/dL (ref 30.0–36.0)
MCV: 97 fL (ref 78.0–100.0)
Monocytes Absolute: 0.8 10*3/uL (ref 0.1–1.0)
Monocytes Relative: 13 % — ABNORMAL HIGH (ref 3–12)
NEUTROS PCT: 71 % (ref 43–77)
Neutro Abs: 4.6 10*3/uL (ref 1.7–7.7)
PLATELETS: 321 10*3/uL (ref 150–400)
RBC: 3.04 MIL/uL — AB (ref 4.22–5.81)
RDW: 14.4 % (ref 11.5–15.5)
WBC: 6.4 10*3/uL (ref 4.0–10.5)

## 2014-01-10 LAB — COMPREHENSIVE METABOLIC PANEL WITH GFR
ALT: 15 U/L (ref 0–53)
AST: 27 U/L (ref 0–37)
Albumin: 2.3 g/dL — ABNORMAL LOW (ref 3.5–5.2)
Alkaline Phosphatase: 83 U/L (ref 39–117)
BUN: 18 mg/dL (ref 6–23)
CO2: 26 meq/L (ref 19–32)
Calcium: 8.7 mg/dL (ref 8.4–10.5)
Chloride: 104 meq/L (ref 96–112)
Creatinine, Ser: 0.87 mg/dL (ref 0.50–1.35)
GFR calc Af Amer: 89 mL/min — ABNORMAL LOW
GFR calc non Af Amer: 77 mL/min — ABNORMAL LOW
Glucose, Bld: 90 mg/dL (ref 70–99)
Potassium: 4.5 meq/L (ref 3.7–5.3)
Sodium: 141 meq/L (ref 137–147)
Total Bilirubin: 0.2 mg/dL — ABNORMAL LOW (ref 0.3–1.2)
Total Protein: 6.1 g/dL (ref 6.0–8.3)

## 2014-01-10 MED ORDER — RIVAROXABAN 15 MG PO TABS
15.0000 mg | ORAL_TABLET | Freq: Every day | ORAL | Status: DC
  Administered 2014-01-10 – 2014-01-30 (×21): 15 mg via ORAL
  Filled 2014-01-10 (×23): qty 1

## 2014-01-10 NOTE — Progress Notes (Signed)
Inpatient [facility] Individual Statement of Services  Patient Name:  [name]  Date:  01/10/2014  Welcome to the [facility].  Our goal is to provide you with an individualized program based on your diagnosis and situation, designed to meet your specific needs.  With this comprehensive rehabilitation program, you will be expected to participate in at least 3 hours of rehabilitation therapies Monday-Friday, with modified therapy programming on the weekends.  Your rehabilitation program will include the following services:  Physical Therapy (PT), Occupational Therapy (OT), Speech Therapy (ST), 24 hour per day rehabilitation nursing, Therapeutic Recreation (TR), Neuropsychology, Case Management (Social Worker), Rehabilitation Medicine, Nutrition Services and Pharmacy Services  Weekly team conferences will be held on Wednesdays to discuss your progress.  Your Social Worker will talk with you frequently to get your input and to update you on team discussions.  Team conferences with you and your family in attendance may also be held.  Expected length of stay: about 3 weeks  Overall anticipated outcome: Supervision overall with minimal assistance for stairs and car transfers   Depending on your progress and recovery, your program may change. Your Social Worker will coordinate services and will keep you informed of any changes. Your Social Worker's name and contact numbers are listed  below.  The following services may also be recommended but are not provided by the [facility] will be made to provide these services after discharge if needed.  Arrangements include referral to agencies that provide these services.  Your insurance has been verified to be:  [employer] Your primary doctor is:  Dr. Cathlean Cower  Pertinent information will be shared with your doctor and your insurance company.  Social Worker:  Alfonse Alpers, LCSW  [phone] or [phone]  Information discussed with and copy given to patient by: Trey Sailors, 01/10/2014, 12:41 PM

## 2014-01-10 NOTE — Progress Notes (Signed)
80 y.o. 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 He was admitted 12/25/2013 with altered mental status and low grade fever of 100 Fahrenheit and reported hematuria. Cranial CT scan negative and MRI of the brain showed punctate acute infarct in the high left parietal lobe as well as extensive chronic ischemic changes. Patient's Xarelto held for a short time secondary to hematuria. He was started on IV rocephin due to SIRS as well as Tamiflu as he tested positive for type A influenza. He was placed on Dysphagia 1 nectar thick liquid diet per ST recommendations. Patient with difficulty with processing with easy distractibility, as well as problems with initiation. Needs tactile cues for basic tasks and mobility. Mentation improving and neurology recommends resuming Xarelto for embolic stroke due to A Fib  On 01/05/14, he was completing his shower when he developed acute onset of right sided weakness with speech difficulty. Code stroke was initiated and he was transferred to acute services. MRI 01/06/2014 revealed Left PCA distribution infarct  Subjective/Complaints: Slept well overnite looks bright this am Review of Systems - difficult to obtain secondary to aphasia and reduced attn Objective: Vital Signs: Blood pressure 144/89, pulse 92, temperature 97.9 F (36.6 C), temperature source Oral, resp. rate 18, height 6' 2.02" (1.88 m), weight 56.8 kg (125 lb 3.5 oz), SpO2 100.00%. No results found. Results for orders placed during the hospital encounter of 01/09/14 (from the past 72 hour(s))  CBC WITH DIFFERENTIAL     Status: Abnormal   Collection Time    01/10/14  4:52 AM      Result Value Range   WBC 6.4  4.0 - 10.5 K/uL   RBC 3.04 (*) 4.22 - 5.81 MIL/uL   Hemoglobin 10.0 (*) 13.0 - 17.0 g/dL   HCT 29.5 (*) 39.0 - 52.0 %   MCV 97.0  78.0 - 100.0 fL   MCH 32.9  26.0 -  34.0 pg   MCHC 33.9  30.0 - 36.0 g/dL   RDW 14.4  11.5 - 15.5 %   Platelets 321  150 - 400 K/uL   Neutrophils Relative % 71  43 - 77 %   Neutro Abs 4.6  1.7 - 7.7 K/uL   Lymphocytes Relative 15  12 - 46 %   Lymphs Abs 0.9  0.7 - 4.0 K/uL   Monocytes Relative 13 (*) 3 - 12 %   Monocytes Absolute 0.8  0.1 - 1.0 K/uL   Eosinophils Relative 1  0 - 5 %   Eosinophils Absolute 0.1  0.0 - 0.7 K/uL   Basophils Relative 0  0 - 1 %   Basophils Absolute 0.0  0.0 - 0.1 K/uL  COMPREHENSIVE METABOLIC PANEL     Status: Abnormal   Collection Time    01/10/14  4:52 AM      Result Value Range   Sodium 141  137 - 147 mEq/L   Potassium 4.5  3.7 - 5.3 mEq/L   Chloride 104  96 - 112 mEq/L   CO2 26  19 - 32 mEq/L   Glucose, Bld 90  70 - 99 mg/dL   BUN 18  6 - 23 mg/dL   Creatinine, Ser 0.87  0.50 - 1.35 mg/dL   Calcium 8.7  8.4 - 10.5 mg/dL   Total Protein 6.1  6.0 - 8.3 g/dL   Albumin 2.3 (*) 3.5 - 5.2  g/dL   AST 27  0 - 37 U/L   ALT 15  0 - 53 U/L   Alkaline Phosphatase 83  39 - 117 U/L   Total Bilirubin 0.2 (*) 0.3 - 1.2 mg/dL   GFR calc non Af Amer 77 (*) >90 mL/min   GFR calc Af Amer 89 (*) >90 mL/min   Comment: (NOTE)     The eGFR has been calculated using the CKD EPI equation.     This calculation has not been validated in all clinical situations.     eGFR's persistently <90 mL/min signify possible Chronic Kidney     Disease.      Constitutional: He appears well-developed and well-nourished.  HENT:  Head: Normocephalic and atraumatic.  Eyes: Conjunctivae and EOM are normal. Pupils are equal, round, and reactive to light.  Neck: Normal range of motion. Neck supple.  Cardiovascular: Normal rate and normal heart sounds. An irregularly irregular rhythm present.  Respiratory: Effort normal. No respiratory distress. He has decreased breath sounds in the right lower field and the left lower field.  Neurological: He is alert. He exhibits normal muscle tone.  Motor strength is 3 minus right  deltoid, bicep, tricep, grip, right ankle dorsiflexor 4 minus right hip flexor knee extensor 5/5 left deltoid, bicep, tricep, grip, hip flexors, knee extensors, ankle dorsiflexor plantar flexor   Assessment/Plan: 1. Functional deficits secondary to L PCA embolic infarct 08/20/538 superimposed on L high parietal infarct onset 12/25/2013 which require 3+ hours per day of interdisciplinary therapy in a comprehensive inpatient rehab setting. Physiatrist is providing close team supervision and 24 hour management of active medical problems listed below. Physiatrist and rehab team continue to assess barriers to discharge/monitor patient progress toward functional and medical goals. FIM: FIM - Bathing Bathing Steps Patient Completed: Chest;Right Arm;Left Arm;Abdomen;Front perineal area;Right upper leg;Left upper leg Bathing: 3: Mod-Patient completes 5-7 42f 10 parts or 50-74%  FIM - Upper Body Dressing/Undressing Upper body dressing/undressing steps patient completed: Put head through opening of pull over shirt/dress Upper body dressing/undressing: 2: Max-Patient completed 25-49% of tasks FIM - Lower Body Dressing/Undressing Lower body dressing/undressing steps patient completed: Thread/unthread left pants leg Lower body dressing/undressing: 2: Max-Patient completed 25-49% of tasks        FIM - Bed/Chair Transfer Bed/Chair Transfer: 3: Bed > Chair or W/C: Mod A (lift or lower assist);3: Supine > Sit: Mod A (lifting assist/Pt. 50-74%/lift 2 legs     Comprehension Comprehension Mode: Auditory Comprehension: 3-Understands basic 50 - 74% of the time/requires cueing 25 - 50%  of the time  Expression Expression Mode: Verbal Expression: 2-Expresses basic 25 - 49% of the time/requires cueing 50 - 75% of the time. Uses single words/gestures.  Social Interaction Social Interaction: 4-Interacts appropriately 75 - 89% of the time - Needs redirection for appropriate language or to initiate  interaction.  Problem Solving Problem Solving: 2-Solves basic 25 - 49% of the time - needs direction more than half the time to initiate, plan or complete simple activities  Memory Memory: 2-Recognizes or recalls 25 - 49% of the time/requires cueing 51 - 75% of the time   Medical Problem List and Plan:  1. DVT Prophylaxis/Anticoagulation: Pharmaceutical: Xarelto  2. Pain Management: tylenol  3. Mood: monitor for depression, encourage interaction  4. Neuropsych: This patient is not capable of making decisions on his own behalf.  5. Afib Xarelto  6. Resp- recent Influenza and hx COPD- IS,monitor   LOS (Days) 1 A FACE TO FACE EVALUATION WAS  PERFORMED  Charlett Blake 01/10/2014, 12:23 PM

## 2014-01-10 NOTE — Evaluation (Signed)
Physical Therapy Assessment and Plan  Patient Details  Name: [name] MRN: [id number] Date of Birth: 1927/06/06  PT Diagnosis: Abnormal posture, Abnormality of gait, Ataxia, Coordination disorder, Hemiparesis dominant, Impaired cognition, Impaired sensation and Muscle weakness Rehab Potential: Good ELOS: 21-25 days  Today's Date: 01/10/2014 Time: 1105-1205 Time Calculation (min): 60 min  Problem List:  Patient Active Problem List   Diagnosis Date Noted  . Acute CVA (cerebrovascular accident) 01/07/2014  . Altered mental status 01/05/2014  . Influenza with other respiratory manifestations 01/01/2014  . Prediabetes 01/01/2014  . CVA (cerebral infarction) 01/01/2014  . Atrial fibrillation 12/25/2013  . Hypothermia 03/23/2013  . Constipation 10/16/2012  . Preventative health care 09/17/2012  . Nocturia 09/17/2012  . UTI (lower urinary tract infection) 08/22/2012  . Leukocytosis 08/22/2012  . Pseudogout 08/22/2012  . Encephalopathy 08/21/2012  . Left knee pain 08/21/2012  . Prerenal azotemia 08/21/2012  . CVA (cerebral vascular accident)-Acute right small nonhemorrhagic infarct seen on MRI 08/17/2012  . Lacunar stroke 04/07/2011  . Pleural effusion 04/07/2011  . 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 Long term current use of anticoagulant 04/07/2011  . NEOPLASM, MALIGNANT, BLADDER 04/10/2009  . HYPERLIPIDEMIA 04/10/2009  . ANEMIA-NOS 04/10/2009  . 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 ANXIETY 04/10/2009  . HYPERTENSION 04/10/2009  . COPD 04/10/2009  . TRANSIENT ISCHEMIC ATTACK, HX OF 04/10/2009    Past Medical History:  Past Medical History  Diagnosis Date  . NEOPLASM, MALIGNANT, BLADDER 04/10/2009  . HYPERLIPIDEMIA 04/10/2009  . ANEMIA-NOS 04/10/2009  . 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 ANXIETY 04/10/2009  . HYPERTENSION 04/10/2009  . Atrial fibrillation 04/10/2009  . COPD 04/10/2009  . TRANSIENT ISCHEMIC ATTACK, HX OF 04/10/2009  . Bladder cancer     Dr.  Darvin Neighbours  . Lacunar stroke 04/07/2011  . Pleural effusion 04/07/2011  . 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 Stroke    Past Surgical History:  Past Surgical History  Procedure Laterality Date  . Splenectomy    . Bilateral vats ablation    . Facial cancer      facial skin cancer    Assessment & Plan Clinical Impression: [name] is a 80 y.o. right handed male with history of COPD, prior CVA 08/2012 with right sided weakness and dysphagia ( CIR stay) , A fib--Xarelto and bladder cancer with ongoing treatment at [facility]. He was admitted 12/25/2013 with altered mental status and low grade fever of 100 Fahrenheit and reported hematuria. Cranial CT scan negative and MRI of the brain showed punctate acute infarct in the high left parietal lobe as well as extensive chronic ischemic changes. Patient's Xarelto held for a short time secondary to hematuria. He was started on IV rocephin due to SIRS as well as Tamiflu as he tested positive for type A influenza. He was placed on Dysphagia 1 nectar thick liquid diet per ST recommendations. Patient with difficulty with processing with easy distractibility, as well as problems with initiation. Needs tactile cues for basic tasks and mobility. Mentation improving and neurology recommends resuming Xarelto for embolic stroke due to A Fib   On 01/05/14, he was completing his shower when he developed acute onset of right sided weakness with speech difficulty. Code stroke was initiated and he was transferred to acute services.  The patient's wife has noted increased problems with vision to the right side. Also has noted uncontrolled movements of the right upper extremity. EEG was negative for seizure. The patient was diagnosed with "Alien hand syndrome" however the symptoms have subsequently improved. Patient transferred  to CIR on 01/09/2014 .   Patient currently requires mod-max with mobility secondary to muscle weakness, decreased cardiorespiratoy endurance, impaired timing  and sequencing, ataxia, decreased coordination and decreased motor planning, decreased midline orientation and decreased attention to right, decreased initiation, decreased attention, decreased awareness, decreased problem solving, decreased memory and delayed processing and decreased sitting balance, decreased standing balance, decreased postural control and decreased balance strategies.  Prior to hospitalization, patient was independent  with mobility and lived with Spouse in a House home.  Home access is 4Stairs to enter.  Patient will benefit from skilled PT intervention to maximize safe functional mobility and minimize fall risk for planned discharge home with 24 hour supervision.  Anticipate patient will benefit from follow up HH at discharge.  PT - End of Session Activity Tolerance: Tolerates 30+ min activity with multiple rests Endurance Deficit: Yes Endurance Deficit Description: Pt requested seated rest break (secondary to fatigue) after gait x12'  PT Assessment Rehab Potential: Good PT Patient demonstrates impairments in the following area(s): Balance;Endurance;Motor;Sensory PT Transfers Functional Problem(s): Bed Mobility;Bed to Chair;Car;Furniture PT Locomotion Functional Problem(s): Ambulation;Wheelchair Mobility;Stairs PT Plan PT Intensity: Minimum of 1-2 x/day ,45 to 90 minutes PT Frequency: 5 out of 7 days PT Duration Estimated Length of Stay: 14-18 days PT Treatment/Interventions: Ambulation/gait training;Balance/vestibular training;Cognitive remediation/compensation;Disease management/prevention;Discharge planning;DME/adaptive equipment instruction;Functional mobility training;Patient/family education;Pain management;Neuromuscular re-education;UE/LE Coordination activities;Wheelchair propulsion/positioning;UE/LE Strength taining/ROM;Stair training;Therapeutic Activities;Therapeutic Exercise PT Transfers Anticipated Outcome(s): Supervision to Min A (min guard) PT Locomotion  Anticipated Outcome(s): Supervision to Min A (min guard) PT Recommendation Follow Up Recommendations: Home health PT Patient destination: Home Equipment Recommended: Rolling walker with 5" wheels;To be determined  Skilled Therapeutic Intervention PT evaluation completed. See below for detailed findings. PT treatment initiated. Pt oriented x4 with increased time. Pt w/c mobility x8' in controlled environment with LUE, min A for placement on w/c hand rim, demonstration cueing to facilitate correct technique. Gait x12' in controlled environment with rolling walker and mod-max A secondary to pt tendency to push trunk to R side. Wife educated on appropriate cueing to increase pt attention to R side of body. Wife in agreement. Pt required frequent rest breaks secondary to fatigue, increased time to execute functional transfers/mobility tasks. Therapist departed with pt seated in w/c with wife present and all needs within reach.  PT Evaluation Precautions/Restrictions Precautions Precautions: Fall Precaution Comments: Sensory deficits on R side; may push to R Restrictions Weight Bearing Restrictions: No General   Vital Signs  Pain Pain Assessment Pain Assessment: No/denies pain Pain Score: 0-No pain Home Living/Prior Functioning Home Living Available Help at Discharge: Available 24 hours/day Type of Home: House Home Access: Stairs to enter [employer] of Steps: 4 Entrance Stairs-Rails: Right Home Layout: One level Additional Comments: tub shower.   Lives With: Spouse Vision/Perception  Vision - History Baseline Vision: Wears glasses all the time Visual History: Cataracts Patient Visual Report: No change from baseline Vision - Assessment Eye Alignment: Impaired (comment) Vision Assessment: Vision not tested Additional Comments: difficult to complete formal assessment d/t cognitive deficit Perception Perception: Impaired Spatial Orientation: overshooting when reaching for  objects Praxis Praxis: Impaired Praxis Impairment Details: Motor planning  Cognition Overall Cognitive Status: Impaired/Different from baseline Arousal/Alertness: Lethargic Orientation Level: Oriented to person;Oriented to place;Oriented to situation;Disoriented to time Attention: Sustained Sustained Attention: Impaired Memory: Impaired Memory Impairment: Decreased recall of new information Awareness: Impaired Awareness Impairment: Emergent impairment;Intellectual impairment Problem Solving: Impaired Safety/Judgment: Impaired Sensation Sensation Light Touch: Impaired by gross assessment Additional Comments: unable to accurately test LE  sensation and proprioception d/t impaired cognition  Coordination Gross Motor Movements are Fluid and Coordinated: No Fine Motor Movements are Fluid and Coordinated: No Motor  Motor Motor: Abnormal postural alignment and control;Hemiplegia Motor - Skilled Clinical Observations: R hemiparesis  Mobility Transfers Transfers: Yes Sit to Stand: From chair/3-in-1;With armrests;With upper extremity assist;3: Mod assist Sit to Stand Details: Manual facilitation for placement Sit to Stand Details (indicate cue type and reason): Cueing for safe hand placement Stand to Sit: 4: Min assist;With armrests;To bed;To chair/3-in-1;With upper extremity assist Stand Pivot Transfers: 3: Mod assist Stand Pivot Transfer Details: Verbal cues for technique;Manual facilitation for weight shifting;Verbal cues for safe use of DME/AE Stand Pivot Transfer Details (indicate cue type and reason): rolling walker Locomotion  Ambulation Ambulation: Yes Ambulation/Gait Assistance: 2: Max assist Ambulation Distance (Feet): 12 Feet Assistive device: Rolling walker Ambulation/Gait Assistance Details: Manual facilitation for weight shifting Ambulation/Gait Assistance Details: Mod-Max A secondary to pt pushing to R side; manual facilitation of weight shift to L Gait Gait:  Yes Gait Pattern: Trunk flexed;Decreased dorsiflexion - right;Left flexed knee in stance;Right flexed knee in stance;Step-to pattern;Lateral trunk lean to right;Decreased step length - left;Decreased stance time - left;Decreased step length - right;Decreased weight shift to left Gait velocity: decreased Stairs / Additional Locomotion Stairs: No (Unsafe at this time) Naval architect Mobility: Yes Wheelchair Assistance: 4: Min Chiropodist Details: Manual facilitation for placement;Tactile cues for initiation Wheelchair Propulsion: Left upper extremity Wheelchair Parts Management: Needs assistance Distance: 8  Trunk/Postural Assessment  Cervical Assessment Cervical Assessment: Within Functional Limits Thoracic Assessment Thoracic Assessment: Within Functional Limits Lumbar Assessment Lumbar Assessment: Within Functional Limits Postural Control Postural Control: Deficits on evaluation Righting Reactions: Unable to formally assess secondary to decreased stability with standing balance Postural Limitations: Limited cervicothoracic extension in standing/walking   Balance Balance Balance Assessed: Yes Static Sitting Balance Static Sitting - Balance Support: Bilateral upper extremity supported;Feet supported Static Sitting - Level of Assistance: 5: Stand by assistance Static Sitting - Comment/# of Minutes: >5  Static Standing Balance Static Standing - Balance Support: Bilateral upper extremity supported Static Standing - Level of Assistance: 4: Min assist;3: Mod assist Static Standing - Comment/# of Minutes: Static standing with bilat UE support at rolling walker x3 minutes with min-mod A secondary to pt tendency to push to R side. Dynamic Standing Balance Dynamic Standing - Balance Support: Bilateral upper extremity supported;During functional activity Dynamic Standing - Level of Assistance: 3: Mod assist;2: Max assist Extremity Assessment      RLE  Assessment RLE Assessment: Exceptions to [facility] RLE Strength RLE Overall Strength: Deficits RLE Overall Strength Comments: Grossly 4-/5 in R hip/knee, R ankle plantarflexion; 3-/5 R ankle dorsiflexion LLE Assessment LLE Assessment: Within Functional Limits  FIM:  FIM - Banker Devices: Walker;Arm rests Bed/Chair Transfer: 3: Bed > Chair or W/C: Mod A (lift or lower assist);3: Chair or W/C > Bed: Mod A (lift or lower assist) FIM - Locomotion: Wheelchair Distance: 8 Locomotion: Wheelchair: 1: Travels less than 50 ft with minimal assistance (Pt.>75%) FIM - Locomotion: Ambulation Locomotion: Ambulation Assistive Devices: Designer, industrial/product Ambulation/Gait Assistance: 2: Max assist Locomotion: Ambulation: 1: Travels less than 50 ft with maximal assistance (Pt: 25 - 49%) FIM - Locomotion: Stairs Locomotion: Stairs: 0: Activity did not occur (Unsafe at this time)   Refer to Care Plan for Long Term Goals  Recommendations for other services: None  Discharge Criteria: Patient will be discharged from PT if patient refuses treatment 3 consecutive  times without medical reason, if treatment goals not met, if there is a change in medical status, if patient makes no progress towards goals or if patient is discharged from hospital.  The above assessment, treatment plan, treatment alternatives and goals were discussed and mutually agreed upon: by patient and by family  Stefano Gaul 01/10/2014, 1:00 PM

## 2014-01-10 NOTE — Progress Notes (Signed)
Occupational Therapy Session Note  Patient Details  Name: [name] MRN: [id number] Date of Birth: 08-20-1927  Today's Date: 01/10/2014 Time: 1400-1430 Time Calculation (min): 30 min  Short Term Goals: Week 1:  OT Short Term Goal 1 (Week 1): Patient will complete LB dressing with mod assist and mod cues OT Short Term Goal 2 (Week 1): Patient will complete bathing with mod assist and mod cueing OT Short Term Goal 3 (Week 1): Patient will complete toilet transfer with min assist OT Short Term Goal 4 (Week 1): Patient will complete UB dressing with min assist OT Short Term Goal 5 (Week 1): Pt demonstrate improved postural control in sitting at midline with min assist for approx 1 min   Skilled Therapeutic Interventions/Progress Updates:    Pt seen for 1:1 OT session with focus on initiation/termination of task, attention to right, and functional transfers. Pt received supine in bed and easily aroused. Pt's wife walked in as pt transferring to w/c and hoping to complete oral care with pt. Completed using sponge brush and suction. Pt initiated task then began perseverating  requiring assist to terminate. Pt using LUE for task and required [facility] assist to begin brushing motion then demonstrated carryover as he initially was making a flossing motion. Pt required verbal and tactile cues to brush right side of mouth. Pt also with left gaze as therapist was cleaning up after oral care. At end of session pt returned to bed with mod assist and focused on scooting in bed. Pt required mod assist and did initiate scooting. Pt left with all needs in reach.   Therapy Documentation Precautions:  Precautions Precautions: Fall Precaution Comments: Sensory deficits on R side; may push to R Restrictions Weight Bearing Restrictions: No General:   Vital Signs:   Pain: No report of pain during therapy session.   Other Treatments:    See FIM for current functional status  Therapy/Group: Individual  Therapy  Duayne Cal 01/10/2014, 2:48 PM

## 2014-01-10 NOTE — Progress Notes (Signed)
Assessment:  [name] is an 80 yo man originally admitted on 12/30 for CVA, hematuria and flu, He was on Xarelto 15 mg daily, which was resumed, but pt. Developed recurrent stroke on 1/10. His renal function (est. crcl ~ 50 ml/min) is borderline qualify for 20mg  daily, per record his PTA dose was 15mg  daily. Dr. Mare Ferrari had pt on lower dose due to his recurrent hematuria. Per Dr. Rosezella [place] note (1/14), the failure of this med could be because it was held for a short while for hematuria, and he recommenced to continue 15mg  daily for now. He is also on diltiazem now, which could inhibit metabolism of xarelto. Hgb 10, plt 321.   Plan:  1. Change Xarelto to 15 mg po q supper as previously prescribed by Dr. Mare Ferrari.  2. F/u renal function, cbc, s/sx of bleeding

## 2014-01-10 NOTE — Evaluation (Signed)
Occupational Therapy Assessment and Plan  Patient Details  Name: Hazel Wrinkle MRN: [id number] Date of Birth: [birth year]  OT Diagnosis: ataxia, cognitive deficits, hemiplegia affecting dominant side and muscle weakness (generalized) Rehab Potential: Rehab Potential: Good ELOS: 21-25 days   Today's Date: 01/10/2014 Time:  0730-0830 Time calculation (min): 60 min   Problem List:  Patient Active Problem List   Diagnosis Date Noted  . Acute CVA (cerebrovascular accident) 01/07/2014  . Altered mental status 01/05/2014  . Influenza with other respiratory manifestations 01/01/2014  . Prediabetes 01/01/2014  . CVA (cerebral infarction) 01/01/2014  . Atrial fibrillation 12/25/2013  . Hypothermia 03/23/2013  . Constipation 10/16/2012  . Preventative health care 09/17/2012  . Nocturia 09/17/2012  . UTI (lower urinary tract infection) 08/22/2012  . Leukocytosis 08/22/2012  . Pseudogout 08/22/2012  . Encephalopathy 08/21/2012  . Left knee pain 08/21/2012  . Prerenal azotemia 08/21/2012  . CVA (cerebral vascular accident)-Acute right small nonhemorrhagic infarct seen on MRI 08/17/2012  . Lacunar stroke 04/07/2011  . Pleural effusion 04/07/2011  . 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 Long term current use of anticoagulant 04/07/2011  . NEOPLASM, MALIGNANT, BLADDER 04/10/2009  . HYPERLIPIDEMIA 04/10/2009  . ANEMIA-NOS 04/10/2009  . 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 ANXIETY 04/10/2009  . HYPERTENSION 04/10/2009  . COPD 04/10/2009  . TRANSIENT ISCHEMIC ATTACK, HX OF 04/10/2009    Past Medical History:  Past Medical History  Diagnosis Date  . NEOPLASM, MALIGNANT, BLADDER 04/10/2009  . HYPERLIPIDEMIA 04/10/2009  . ANEMIA-NOS 04/10/2009  . 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 ANXIETY 04/10/2009  . HYPERTENSION 04/10/2009  . Atrial fibrillation 04/10/2009  . COPD 04/10/2009  . TRANSIENT ISCHEMIC ATTACK, HX OF 04/10/2009  . Bladder cancer     Dr. Lawerance Bach  . Lacunar stroke  04/07/2011  . Pleural effusion 04/07/2011  . 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 Stroke    Past Surgical History:  Past Surgical History  Procedure Laterality Date  . Splenectomy    . Bilateral vats ablation    . Facial cancer      facial skin cancer    Assessment & Plan Clinical Impression: Patient is a 80 y.o. right handed male with history of COPD, prior CVA 08/2012 with right sided weakness and dysphagia ( CIR stay) , A fib--Xarelto and bladder cancer with ongoing treatment at [facility]. He was admitted 12/25/2013 with altered mental status and low grade fever of 100 Fahrenheit and reported hematuria. Cranial CT scan negative and MRI of the brain showed punctate acute infarct in the high left parietal lobe as well as extensive chronic ischemic changes. Patient's Xarelto held for a short time secondary to hematuria. He was started on IV rocephin due to SIRS as well as Tamiflu as he tested positive for type A influenza. He was placed on Dysphagia 1 nectar thick liquid diet per ST recommendations. Patient with difficulty with processing with easy distractibility, as well as problems with initiation. Needs tactile cues for basic tasks and mobility. Mentation improving and neurology recommends resuming Xarelto for embolic stroke due to A Fib  On 01/05/14, he was completing his shower when he developed acute onset of right sided weakness with speech difficulty. Code stroke was initiated and he was transferred to acute services.  The patient's wife has noted increased problems with vision to the right side. Also has noted uncontrolled movements of the right upper extremity. EEG was negative for seizure. The patient was diagnosed with "Alien hand syndrome" however the symptoms have subsequently improved   Patient transferred to  CIR on 01/09/2014 .    Patient currently requires max-mod assist with basic self-care skills secondary to muscle weakness, decreased cardiorespiratoy endurance, impaired timing and  sequencing, ataxia, decreased coordination and decreased motor planning, decreased visual perceptual skills, decreased midline orientation and decreased attention to right, decreased attention, decreased awareness, decreased problem solving, decreased safety awareness, decreased memory and delayed processing and decreased sitting balance, decreased standing balance, decreased postural control and decreased balance strategies.  Prior to hospitalization, patient could complete BADLs with supervision.  Patient will benefit from skilled intervention to increase independence with basic self-care skills prior to discharge home with care partner.  Anticipate patient will require 24 hour supervision and follow up home health.  OT - End of Session Activity Tolerance: Decreased this session Endurance Deficit: Yes OT Assessment Rehab Potential: Good OT Patient demonstrates impairments in the following area(s): Balance;Cognition;Endurance;Motor;Perception;Safety;Sensory;Vision OT Basic ADL's Functional Problem(s): Grooming;Bathing;Dressing;Toileting;Eating OT Transfers Functional Problem(s): Toilet;Tub/Shower OT Plan OT Intensity: Minimum of 1-2 x/day, 45 to 90 minutes OT Frequency: 5 out of 7 days OT Duration/Estimated Length of Stay: 21-25 days OT Treatment/Interventions: Balance/vestibular training;Cognitive remediation/compensation;Community reintegration;Discharge planning;DME/adaptive equipment instruction;Functional mobility training;Pain management;Patient/family education;Neuromuscular re-education;Psychosocial support;Self Care/advanced ADL retraining;Therapeutic Activities;Therapeutic Exercise;UE/LE Strength taining/ROM;UE/LE Coordination activities;Visual/perceptual remediation/compensation OT Self Feeding Anticipated Outcome(s): supervision OT Basic Self-Care Anticipated Outcome(s): supervision  OT Toileting Anticipated Outcome(s): supervision  OT Bathroom Transfers Anticipated Outcome(s):  supervision OT Recommendation Patient destination: Home Follow Up Recommendations: Home health OT Equipment Recommended: 3 in 1 bedside comode;Tub/shower bench   Skilled Therapeutic Intervention Pt seen for ADL retraining with focus on sustained attention, visual scanning, functional use of RUE, and postural control in sitting. Pt appeared to have slight visual impairments when scanning to right side. Noted RUE apraxia when reaching for items in midline as he was overshooting and undershooting. Pt with lateral lean to R requiring min assist to correct and reporting feeling like he was "leaning" when sitting upright in midline. Pt noted to have impaired visual perceptual awareness. Pt required mod cues for sustained attention to tasks. Pt with difficulty of word retrieval when naming clothing items however no dressing apraxia noted as he initiated donning clothing correctly. Pt with impaired sensation during bathing as he was unaware when he dropped wash cloth from right hand. Provided cues to visually attend to RUE when using it for bathing. At end of session pt completed stand pivot transfer bed>w/c with mod assist. Pt left with quick release belt donned and demonstrated ability to use call light.   OT Evaluation Precautions/Restrictions  Precautions Precautions: Fall Restrictions Weight Bearing Restrictions: No General   Vital Signs Therapy Vitals Temp: 97.9 F (36.6 C) Temp src: Oral Pulse Rate: 92 Resp: 18 BP: 144/89 mmHg Patient Position, if appropriate: Lying Oxygen Therapy SpO2: 100 % O2 Device: None (Room air) Pain Pain Assessment Pain Assessment: No/denies pain Home Living/Prior Functioning Home Living Available Help at Discharge: Available 24 hours/day Type of Home: House Home Access: Stairs to enter [employer] of Steps: 4 Entrance Stairs-Rails: Right Home Layout: One level Additional Comments: tub shower.   Lives With: Spouse ADL   Vision/Perception   Vision - History Baseline Vision: Wears glasses all the time Visual History: Cataracts Patient Visual Report: No change from baseline Vision - Assessment Eye Alignment: Impaired (comment) Vision Assessment: Vision not tested Additional Comments: difficult to complete formal assessment d/t cognitive deficit Perception Perception: Impaired Spatial Orientation: overshooting when reaching for objects Praxis Praxis: Impaired Praxis Impairment Details: Motor planning  Cognition Overall Cognitive Status: Impaired/Different from  baseline Arousal/Alertness: Lethargic Orientation Level: Oriented to person;Oriented to time Attention: Sustained Sustained Attention: Impaired Memory: Impaired Memory Impairment: Decreased recall of new information Awareness: Impaired Awareness Impairment: Emergent impairment;Intellectual impairment Problem Solving: Impaired Safety/Judgment: Impaired Sensation Sensation Light Touch: Impaired by gross assessment Additional Comments: unable to accurately test UE sensation and propriception d/t impaired cognition  Coordination Gross Motor Movements are Fluid and Coordinated: No Fine Motor Movements are Fluid and Coordinated: No Finger Nose Finger Test: ataxic movements noted in RUE (overshooting and undershooting with rigid movements) Motor    Mobility     Trunk/Postural Assessment     Balance   Extremity/Trunk Assessment RUE Assessment RUE Assessment: Exceptions to WFL (full ROM; ataxic) LUE Assessment LUE Assessment: Within Functional Limits (would benefit from UE strengthening)  FIM:  FIM - Grooming Grooming Steps: Wash, rinse, dry face;Wash, rinse, dry hands Grooming: 3: Patient completes 2 of 4 or 3 of 5 steps FIM - Bathing Bathing Steps Patient Completed: Chest;Right Arm;Left Arm;Abdomen;Front perineal area;Right upper leg;Left upper leg Bathing: 3: Mod-Patient completes 5-7 33f 10 parts or 50-74% FIM - Upper Body Dressing/Undressing Upper  body dressing/undressing steps patient completed: Put head through opening of pull over shirt/dress Upper body dressing/undressing: 2: Max-Patient completed 25-49% of tasks FIM - Lower Body Dressing/Undressing Lower body dressing/undressing steps patient completed: Thread/unthread left pants leg Lower body dressing/undressing: 2: Max-Patient completed 25-49% of tasks FIM - Bed/Chair Transfer Bed/Chair Transfer: 3: Bed > Chair or W/C: Mod A (lift or lower assist);3: Supine > Sit: Mod A (lifting assist/Pt. 50-74%/lift 2 legs   Refer to Care Plan for Long Term Goals  Recommendations for other services: None  Discharge Criteria: Patient will be discharged from OT if patient refuses treatment 3 consecutive times without medical reason, if treatment goals not met, if there is a change in medical status, if patient makes no progress towards goals or if patient is discharged from hospital.  The above assessment, treatment plan, treatment alternatives and goals were discussed and mutually agreed upon: by patient  Timmey Lamba, Quillian Quince 01/10/2014, 8:30 AM

## 2014-01-10 NOTE — Evaluation (Signed)
Speech Language Pathology Assessment and Plan  Patient Details  Name: [name] MRN: [id number] Date of Birth: [birth year]  SLP Diagnosis: Aphasia;Dysarthria;Cognitive Impairments;Dysphagia  Rehab Potential: Good ELOS: 21-25 days   Today's Date: 01/10/2014 Time: 0900-1006 Time Calculation (min): 66 min  Problem List:  Patient Active Problem List   Diagnosis Date Noted  . Acute CVA (cerebrovascular accident) 01/07/2014  . Altered mental status 01/05/2014  . Influenza with other respiratory manifestations 01/01/2014  . Prediabetes 01/01/2014  . CVA (cerebral infarction) 01/01/2014  . Atrial fibrillation 12/25/2013  . Hypothermia 03/23/2013  . Constipation 10/16/2012  . Preventative health care 09/17/2012  . Nocturia 09/17/2012  . UTI (lower urinary tract infection) 08/22/2012  . Leukocytosis 08/22/2012  . Pseudogout 08/22/2012  . Encephalopathy 08/21/2012  . Left knee pain 08/21/2012  . Prerenal azotemia 08/21/2012  . CVA (cerebral vascular accident)-Acute right small nonhemorrhagic infarct seen on MRI 08/17/2012  . Lacunar stroke 04/07/2011  . Pleural effusion 04/07/2011  . 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 Long term current use of anticoagulant 04/07/2011  . NEOPLASM, MALIGNANT, BLADDER 04/10/2009  . HYPERLIPIDEMIA 04/10/2009  . ANEMIA-NOS 04/10/2009  . 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 ANXIETY 04/10/2009  . HYPERTENSION 04/10/2009  . COPD 04/10/2009  . TRANSIENT ISCHEMIC ATTACK, HX OF 04/10/2009   Past Medical History:  Past Medical History  Diagnosis Date  . NEOPLASM, MALIGNANT, BLADDER 04/10/2009  . HYPERLIPIDEMIA 04/10/2009  . ANEMIA-NOS 04/10/2009  . 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 ANXIETY 04/10/2009  . HYPERTENSION 04/10/2009  . Atrial fibrillation 04/10/2009  . COPD 04/10/2009  . TRANSIENT ISCHEMIC ATTACK, HX OF 04/10/2009  . Bladder cancer     Dr. Darvin Neighbours  . Lacunar stroke 04/07/2011  . Pleural effusion 04/07/2011  .  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 Stroke    Past Surgical History:  Past Surgical History  Procedure Laterality Date  . Splenectomy    . Bilateral vats ablation    . Facial cancer      facial skin cancer    Assessment / Plan / Recommendation Clinical Impression  Pt is an 80 y.o. right handed male with h/o COPD, prior CVA 08/2012 with right sided weakness and dysphagia (CIR stay), A fib--Xarelto and bladder cancer with ongoing tx at [facility]. He was admitted 12/25/13 with AMS and low grade fever of 100 Fahrenheit and reported hematuria. Cranial CT scan negative and MRI of the brain showed punctate acute infarct in the high left parietal lobe as well as extensive chronic ischemic changes. Patient's Xarelto held for a short time secondary to hematuria. He was started on IV rocephin due to SIRS as well as Tamiflu as he tested positive for type A influenza. He was placed on Dys 1 nectar thick liquid diet per ST recommendations. Pt with difficulty with processing with easy distractibility, as well as problems with initiation. Needs tactile cues for basic tasks and mobility. Mentation improving and neurology recommends resuming Xarelto for embolic stroke due to A Fib. On 01/05/14, he was completing his shower when he developed acute onset of right sided weakness with speech difficulty. Code stroke was initiated and he was transferred to acute services. The pt's wife has noted increased problems with vision to the right side. Also has noted uncontrolled movements of the right upper extremity. EEG was negative for seizure. The patient was diagnosed with "Alien hand syndrome" however the symptoms have subsequently improved. Pt was readmitted to CIR 1/14 with SLP bedside swallow and cognitive-linguistic eval completed 1/15. Pt presents with lethargy, disorientation, and  decreased sustained attention, impacting higher levels of cognitive function including basic problem solving, recall of daily event, and initiation. Pt  exhibits an expressive greater than receptive aphasia, with verbal communication limited to one-word and short-word phrases. Pt exhibited oral weakness and decreased ROM that impacts speech intelligibility as well as swallowing function. Pt exhibited decreased bolus manipulation and containment with Dys 1 textures and honey thick liquids, requiring Max cues for utilization of safe swallow strategies. Intermittent coughing was reduced with smaller bolus sizes. Pt will benefit from SLP services to maximize cognitive function, functional communication, and swallowing safety.    SLP Assessment  Patient will need skilled Speech Lanaguage Pathology Services during CIR admission    Recommendations  Diet Recommendations: Dysphagia 1 (Puree);Honey-thick liquid Liquid Administration via: Cup;No straw Medication Administration: Crushed with puree Supervision: Full supervision/cueing for compensatory strategies;Staff to assist with self feeding;Trained caregiver to feed patient Compensations: Slow rate;Small sips/bites;Multiple dry swallows after each bite/sip Postural Changes and/or Swallow Maneuvers: Seated upright 90 degrees Oral Care Recommendations: Oral care BID Patient destination: Home Follow up Recommendations: Home Health SLP;24 hour supervision/assistance Equipment Recommended: None recommended by SLP    SLP Frequency 5 out of 7 days   SLP Treatment/Interventions Cognitive remediation/compensation;Cueing hierarchy;Dysphagia/aspiration precaution training;Environmental controls;Functional tasks;Internal/external aids;Patient/family education;Speech/Language facilitation;Oral motor exercises    Pain Pain Assessment Pain Assessment: No/denies pain Pain Score: 0-No pain Prior Functioning Cognitive/Linguistic Baseline: Baseline deficits Baseline deficit details: son previously reported mild cognitive impairments from previous CVA 07/2012 Type of Home: House  Lives With: Spouse Available Help  at Discharge: Available 24 hours/day Vocation: Retired  Industrial/product designer Term Goals: Week 1: SLP Short Term Goal 1 (Week 1): Pt will consume Dys 1 textures and honey thick liquids, utilizing safe swallowing strategies with Mod cues SLP Short Term Goal 2 (Week 1): Pt will consume therapeutic trials of nectar thick liquid with minimal overt s/s of aspiration to determine readiness for diet advancement SLP Short Term Goal 3 (Week 1): Pt will sustain attention to basic functional task for 60 seconds with Max cues SLP Short Term Goal 4 (Week 1): Pt will use environmental clues to demonstrate orientation x4 with Max cues SLP Short Term Goal 5 (Week 1): Pt will verbally communicate wants/needs during functional task with Max cues SLP Short Term Goal 6 (Week 1): Pt will name common objects with 80% accuracy with Mod cues  See FIM for current functional status Refer to Care Plan for Long Term Goals  Recommendations for other services: None  Discharge Criteria: Patient will be discharged from SLP if patient refuses treatment 3 consecutive times without medical reason, if treatment goals not met, if there is a change in medical status, if patient makes no progress towards goals or if patient is discharged from hospital.  The above assessment, treatment plan, treatment alternatives and goals were discussed and mutually agreed upon: by patient and by family   Germain Osgood, M.A. CCC-SLP [phone]   Germain Osgood 01/10/2014, 1:13 PM

## 2014-01-10 NOTE — Progress Notes (Signed)
Social Work Assessment and Plan  Patient Details  Name: [name] MRN: [id number] Date of Birth: Aug 10, 1927  Today's Date: 01/10/2014  Problem List:  Patient Active Problem List   Diagnosis Date Noted  . Acute CVA (cerebrovascular accident) 01/07/2014  . Altered mental status 01/05/2014  . Influenza with other respiratory manifestations 01/01/2014  . Prediabetes 01/01/2014  . CVA (cerebral infarction) 01/01/2014  . Atrial fibrillation 12/25/2013  . Hypothermia 03/23/2013  . Constipation 10/16/2012  . Preventative health care 09/17/2012  . Nocturia 09/17/2012  . UTI (lower urinary tract infection) 08/22/2012  . Leukocytosis 08/22/2012  . Pseudogout 08/22/2012  . Encephalopathy 08/21/2012  . Left knee pain 08/21/2012  . Prerenal azotemia 08/21/2012  . CVA (cerebral vascular accident)-Acute right small nonhemorrhagic infarct seen on MRI 08/17/2012  . Lacunar stroke 04/07/2011  . Pleural effusion 04/07/2011  . 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 Long term current use of anticoagulant 04/07/2011  . NEOPLASM, MALIGNANT, BLADDER 04/10/2009  . HYPERLIPIDEMIA 04/10/2009  . ANEMIA-NOS 04/10/2009  . 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 ANXIETY 04/10/2009  . HYPERTENSION 04/10/2009  . COPD 04/10/2009  . TRANSIENT ISCHEMIC ATTACK, HX OF 04/10/2009   Past Medical History:  Past Medical History  Diagnosis Date  . NEOPLASM, MALIGNANT, BLADDER 04/10/2009  . HYPERLIPIDEMIA 04/10/2009  . ANEMIA-NOS 04/10/2009  . 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 ANXIETY 04/10/2009  . HYPERTENSION 04/10/2009  . Atrial fibrillation 04/10/2009  . COPD 04/10/2009  . TRANSIENT ISCHEMIC ATTACK, HX OF 04/10/2009  . Bladder cancer     Dr. Lawerance Bach  . Lacunar stroke 04/07/2011  . Pleural effusion 04/07/2011  . 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 Stroke    Past Surgical History:  Past Surgical History  Procedure Laterality Date  . Splenectomy    . Bilateral vats ablation    . Facial cancer     facial skin cancer   Social History:  reports that he has never smoked. He has never used smokeless tobacco. He reports that he does not drink alcohol or use illicit drugs.  Family / Support Systems Patient Roles: Spouse Anticipated Caregiver: wife Ability/Limitations of Caregiver: none Caregiver Availability: 24/7  Social History Preferred language: English Religion: Christian Employment Status: Retired Date Retired/Disabled/Unemployed: several years ago Freight forwarder Issues: none Guardian/Conservator: N/A   Abuse/Neglect Physical Abuse: Denies Verbal Abuse: Denies Sexual Abuse: Denies Exploitation of patient/patient's resources: Denies Self-Neglect: Denies  Emotional Status Pt's affect, behavior and adjustment status: Pt was very lethargic and could not hold conversation with CSW at time of initial assessment.  CSW will continue to assess pt.  Again on 01-10-14 with reassessment, pt could not participate in conversation and pt's wife answered all of CSW's questions.   Recent Psychosocial Issues: none per son Pyschiatric History: none per son  Patient / Family Perceptions, Expectations & Goals Pt/Family understanding of illness & functional limitations: Pt's son expressed a good understanding of pt's condition and feels family's questions have been answered. 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 He also enjoys golfing and spending time with family. Anticipated changes in roles/activities/participation: Pt's wife can assist pt and keep the household going. Pt/family expectations/goals: Pt's son reports that pt just wants to get home.  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   Patient/Family Preliminary Plans: Pt will go home with his wife at d/c.  Pt's wife has been providing pt with 24/7 supervision already, so this will not be a change for her. Barriers to Discharge: Steps Social Work Anticipated Follow Up Needs: HH/OP Expected length of stay: about 3 weeks  Clinical Impression CSW met with pt and his wife to welcome pt back to Rehab, as pt was transferred to acute due to another stroke, and to make sure pt/wife are doing okay.  Pt's wife reports being very tired and she decided to stay at home last night.  Pt has now been here 17 days and there were just too many things that needed to be done.  She is disappointed that pt had another stroke and that his condition has worsened and now his speech is affected.  Wife is aware that pt will need more physical care now and she is prepared to provide this.  CSW listened to pt and offered support and encouragement and told her to contact CSW if she needs anything.  Wife was very appreciative of CSW visit and she/her son both feel comfortable reaching out if they need to.  CSW will continue to follow pt/family and assist as needed.   Harleigh Civello, [name] 01/10/2014, 12:30 PM

## 2014-01-10 NOTE — Progress Notes (Signed)
Patient information reviewed and entered into eRehab system by Daiva Nakayama, RN, CRRN, [place] Coordinator.  Information including medical coding and functional independence measure will be reviewed and updated through discharge.     This patient is a readmit past short stay on acute.  Per nursing patient was given "Data Collection Information Summary for Patients in Inpatient Rehabilitation Facilities with attached "Privacy Act [place] Records" upon original admission to the unit and still has the document in their possession and expressed understanding.

## 2014-01-10 NOTE — Plan of Care (Signed)
Problem: RH SKIN INTEGRITY Goal: RH STG SKIN FREE OF INFECTION/BREAKDOWN Patient will remain free from infection or breakdown with mod assist.

## 2014-01-11 ENCOUNTER — Inpatient Hospital Stay (HOSPITAL_COMMUNITY): Payer: Medicare Other

## 2014-01-11 DIAGNOSIS — R5381 Other malaise: Secondary | ICD-10-CM

## 2014-01-11 MED ORDER — FLUCONAZOLE 100 MG PO TABS
100.0000 mg | ORAL_TABLET | Freq: Every day | ORAL | Status: AC
  Administered 2014-01-11 – 2014-01-13 (×3): 100 mg via ORAL
  Filled 2014-01-11 (×3): qty 1

## 2014-01-11 MED ORDER — BOOST / RESOURCE BREEZE PO LIQD
1.0000 | Freq: Three times a day (TID) | ORAL | Status: DC
Start: 2014-01-11 — End: 2014-01-23
  Administered 2014-01-11 – 2014-01-23 (×20): 1 via ORAL

## 2014-01-11 NOTE — Progress Notes (Signed)
80 y.o. right handed male with history of COPD, prior CVA 08/2012 with right sided weakness and dysphagia ( CIR stay) , A fib--Xarelto and bladder cancer with ongoing treatment at [facility]. He was admitted 12/25/2013 with altered mental status and low grade fever of 100 Fahrenheit and reported hematuria. Cranial CT scan negative and MRI of the brain showed punctate acute infarct in the high left parietal lobe as well as extensive chronic ischemic changes. Patient's Xarelto held for a short time secondary to hematuria. He was started on IV rocephin due to SIRS as well as Tamiflu as he tested positive for type A influenza. He was placed on Dysphagia 1 nectar thick liquid diet per ST recommendations. Patient with difficulty with processing with easy distractibility, as well as problems with initiation. Needs tactile cues for basic tasks and mobility. Mentation improving and neurology recommends resuming Xarelto for embolic stroke due to A Fib  On 01/05/14, he was completing his shower when he developed acute onset of right sided weakness with speech difficulty. Code stroke was initiated and he was transferred to acute services. MRI 01/06/2014 revealed Left PCA distribution infarct  Subjective/Complaints: Slept well overnite poor eye contact, kyphotic posture  Review of Systems - difficult to obtain secondary to aphasia and reduced attn Objective: Vital Signs: Blood pressure 136/85, pulse 115, temperature 98.1 F (36.7 C), temperature source Oral, resp. rate 18, height 6' 2.02" (1.88 m), weight 56.8 kg (125 lb 3.5 oz), SpO2 99.00%. No results found. Results for orders placed during the hospital encounter of 01/09/14 (from the past 72 hour(s))  CBC WITH DIFFERENTIAL     Status: Abnormal   Collection Time    01/10/14  4:52 AM      Result Value Range   WBC 6.4  4.0 - 10.5 K/uL   RBC 3.04 (*) 4.22 - 5.81 MIL/uL   Hemoglobin 10.0 (*) 13.0 - 17.0 g/dL   HCT 29.5 (*) 39.0 - 52.0 %   MCV 97.0  78.0 - 100.0 fL    MCH 32.9  26.0 - 34.0 pg   MCHC 33.9  30.0 - 36.0 g/dL   RDW 14.4  11.5 - 15.5 %   Platelets 321  150 - 400 K/uL   Neutrophils Relative % 71  43 - 77 %   Neutro Abs 4.6  1.7 - 7.7 K/uL   Lymphocytes Relative 15  12 - 46 %   Lymphs Abs 0.9  0.7 - 4.0 K/uL   Monocytes Relative 13 (*) 3 - 12 %   Monocytes Absolute 0.8  0.1 - 1.0 K/uL   Eosinophils Relative 1  0 - 5 %   Eosinophils Absolute 0.1  0.0 - 0.7 K/uL   Basophils Relative 0  0 - 1 %   Basophils Absolute 0.0  0.0 - 0.1 K/uL  COMPREHENSIVE METABOLIC PANEL     Status: Abnormal   Collection Time    01/10/14  4:52 AM      Result Value Range   Sodium 141  137 - 147 mEq/L   Potassium 4.5  3.7 - 5.3 mEq/L   Chloride 104  96 - 112 mEq/L   CO2 26  19 - 32 mEq/L   Glucose, Bld 90  70 - 99 mg/dL   BUN 18  6 - 23 mg/dL   Creatinine, Ser 0.87  0.50 - 1.35 mg/dL   Calcium 8.7  8.4 - 10.5 mg/dL   Total Protein 6.1  6.0 - 8.3 g/dL   Albumin 2.3 (*) 3.5 -  5.2 g/dL   AST 27  0 - 37 U/L   ALT 15  0 - 53 U/L   Alkaline Phosphatase 83  39 - 117 U/L   Total Bilirubin 0.2 (*) 0.3 - 1.2 mg/dL   GFR calc non Af Amer 77 (*) >90 mL/min   GFR calc Af Amer 89 (*) >90 mL/min   Comment: (NOTE)     The eGFR has been calculated using the CKD EPI equation.     This calculation has not been validated in all clinical situations.     eGFR's persistently <90 mL/min signify possible Chronic Kidney     Disease.      Constitutional: He appears well-developed and well-nourished.  HENT:  Head: Normocephalic and atraumatic.  Eyes: Conjunctivae and EOM are normal. Pupils are equal, round, and reactive to light.  Neck: Normal range of motion. Neck supple.  Cardiovascular: Normal rate and normal heart sounds. An irregularly irregular rhythm present.  Respiratory: Effort normal. No respiratory distress. He has decreased breath sounds in the right lower field and the left lower field.  Neurological: He is alert. He exhibits normal muscle tone.  Motor strength  is 3 minus right deltoid, bicep, tricep, grip, right ankle dorsiflexor 4 minus right hip flexor knee extensor 5/5 left deltoid, bicep, tricep, grip, hip flexors, knee extensors, ankle dorsiflexor plantar flexor   Assessment/Plan: 1. Functional deficits secondary to L PCA embolic infarct 01/04/3234 superimposed on L high parietal infarct onset 12/25/2013 which require 3+ hours per day of interdisciplinary therapy in a comprehensive inpatient rehab setting. Physiatrist is providing close team supervision and 24 hour management of active medical problems listed below. Physiatrist and rehab team continue to assess barriers to discharge/monitor patient progress toward functional and medical goals. FIM: FIM - Bathing Bathing Steps Patient Completed: Chest;Right Arm;Left Arm;Abdomen;Front perineal area;Right upper leg;Left upper leg Bathing: 3: Mod-Patient completes 5-7 52f 10 parts or 50-74%  FIM - Upper Body Dressing/Undressing Upper body dressing/undressing steps patient completed: Put head through opening of pull over shirt/dress Upper body dressing/undressing: 2: Max-Patient completed 25-49% of tasks FIM - Lower Body Dressing/Undressing Lower body dressing/undressing steps patient completed: Thread/unthread left pants leg Lower body dressing/undressing: 2: Max-Patient completed 25-49% of tasks        FIM - Control and instrumentation engineer Devices: Walker;Arm rests Bed/Chair Transfer: 3: Supine > Sit: Mod A (lifting assist/Pt. 50-74%/lift 2 legs;3: Bed > Chair or W/C: Mod A (lift or lower assist);3: Chair or W/C > Bed: Mod A (lift or lower assist);3: Sit > Supine: Mod A (lifting assist/Pt. 50-74%/lift 2 legs)  FIM - Locomotion: Wheelchair Distance: 8 Locomotion: Wheelchair: 1: Travels less than 50 ft with minimal assistance (Pt.>75%) FIM - Locomotion: Ambulation Locomotion: Ambulation Assistive Devices: Administrator Ambulation/Gait Assistance: 2: Max  assist Locomotion: Ambulation: 1: Travels less than 50 ft with maximal assistance (Pt: 25 - 49%)  Comprehension Comprehension Mode: Auditory Comprehension: 3-Understands basic 50 - 74% of the time/requires cueing 25 - 50%  of the time  Expression Expression Mode: Verbal Expression: 2-Expresses basic 25 - 49% of the time/requires cueing 50 - 75% of the time. Uses single words/gestures.  Social Interaction Social Interaction: 4-Interacts appropriately 75 - 89% of the time - Needs redirection for appropriate language or to initiate interaction.  Problem Solving Problem Solving: 2-Solves basic 25 - 49% of the time - needs direction more than half the time to initiate, plan or complete simple activities  Memory Memory: 2-Recognizes or recalls 25 - 49%  of the time/requires cueing 51 - 75% of the time   Medical Problem List and Plan:  1. DVT Prophylaxis/Anticoagulation: Pharmaceutical: Xarelto  2. Pain Management: tylenol  3. Mood: monitor for depression, encourage interaction  4. Neuropsych: This patient is not capable of making decisions on his own behalf.  5. Afib Xarelto  6. Resp- recent Influenza and hx COPD- IS,monitor   LOS (Days) 2 A FACE TO FACE EVALUATION WAS PERFORMED  Ngan Qualls E 01/11/2014, 7:58 AM

## 2014-01-11 NOTE — Progress Notes (Signed)
INITIAL NUTRITION ASSESSMENT  Pt meets criteria for severe MALNUTRITION in the context of chronic illness as evidenced by <75% estimated energy intake for the past month in addition to pt with severe muscle wasting and subcutaneous fat loss in clavicles.  DOCUMENTATION CODES Per approved criteria  -Severe malnutrition in the context of chronic illness -Underweight   INTERVENTION: - Resource Breeze TID - RN to thicken to appropriate consistency - RD to continue to monitor  NUTRITION DIAGNOSIS: Increased nutrient needs related to underweight as evidenced by BMI of 16 kg/(m^2).   Goal: Pt to consume 100% of meals/supplements  Monitor:  Weights, labs, intake  Reason for Assessment: Low braden  80 y.o. male  Admitting Dx: left PCA infarct   ASSESSMENT: Pt with history of COPD, prior CVA 08/2012 with right sided weakness and dysphagia, A fib--Xarelto and bladder cancer with ongoing treatment. He was admitted 12/25/2013 with altered mental status and low grade fever of 100 Fahrenheit and reported hematuria. Cranial CT scan negative and MRI of the brain showed punctate acute infarct in the high left parietal lobe as well as extensive chronic ischemic changes. He was placed on Dysphagia 1 nectar thick liquid diet per ST recommendations. Patient with difficulty with processing with easy distractibility, as well as problems with initiation. Needs tactile cues for basic tasks and mobility. On 01/05/14, he was completing his shower when he developed acute onset of right sided weakness with speech difficulty. Code stroke was initiated and he was transferred to acute services. MRI 01/06/2014 revealed Left PCA distribution infarct.  Met with pt and wife who report pt has been consistently eating 50% of meals and eats 100% of Magic Cups. Wife reports pt has never been a big guy, always thin, but thinks pt's weight has gone down 2 pounds in the past month. Pt does not like Ensure.   Nutrition Focused  Physical Exam:  Subcutaneous Fat:  Orbital Region: mild/moderate wasting Upper Arm Region: NA Thoracic and Lumbar Region: mild/moderate wasting  Muscle:  Temple Region: mild/moderate wasting Clavicle Bone Region: severe wasting Clavicle and Acromion Bone Region: severe wasting Scapular Bone Region: NA Dorsal Hand: mild/moderate wasting Patellar Region: mild/moderate wasting Anterior Thigh Region: mild/moderate wasting Posterior Calf Region: mild/moderate wasting  Edema: None noted    Height: Ht Readings from Last 1 Encounters:  01/09/14 6' 2.02" (1.88 m)    Weight: Wt Readings from Last 1 Encounters:  01/09/14 125 lb 3.5 oz (56.8 kg)    Ideal Body Weight: 190 lb  % Ideal Body Weight: 66%  Wt Readings from Last 10 Encounters:  01/09/14 125 lb 3.5 oz (56.8 kg)  01/09/14 125 lb 3.2 oz (56.79 kg)  01/02/14 131 lb 9.8 oz (59.7 kg)  12/25/13 171 lb 6.4 oz (77.747 kg)  12/07/13 128 lb (58.06 kg)  09/07/13 127 lb 12.8 oz (57.97 kg)  06/05/13 132 lb (59.875 kg)  03/23/13 132 lb (59.875 kg)  03/16/13 132 lb (59.875 kg)  03/06/13 133 lb (60.328 kg)    Usual Body Weight: 127 lb per wife  % Usual Body Weight: 98%  BMI:  Body mass index is 16.07 kg/(m^2). Underweight  Estimated Nutritional Needs: Kcal: 2000-2400 Protein: 115-130g/day Fluid: 2-2.4L/day  Skin: Intact  Diet Order: Dysphagia 1, honey thick  EDUCATION NEEDS: -No education needs identified at this time   Intake/Output Summary (Last 24 hours) at 01/11/14 1438 Last data filed at 01/11/14 1145  Gross per 24 hour  Intake    480 ml  Output  200 ml  Net    280 ml    Last BM: 1/15  Labs:   Recent Labs Lab 01/06/14 0355 01/07/14 0445 01/10/14 0452  NA 137 136* 141  K 3.6* 3.8 4.5  CL 98 98 104  CO2 _0 BUN _1 CREATININE 0.82 0.77 0.87  CALCIUM 8.3* 8.4 8.7  MG  --  1.9  --   GLUCOSE 86 90 90    CBG (last 3)   Recent Labs  01/08/14 1720  GLUCAP 125*     Scheduled Meds: . antiseptic oral rinse  15 mL Mouth Rinse q12n4p  . chlorhexidine  15 mL Mouth Rinse BID  . diltiazem  180 mg Oral Daily  . doxycycline  100 mg Oral Q12H  . fluconazole  100 mg Oral Daily  . rivaroxaban  15 mg Oral Q supper  . sodium chloride 0.9 % 1,000 mL with potassium chloride 20 mEq infusion   Intravenous Custom    Continuous Infusions:   Past Medical History  Diagnosis Date  . NEOPLASM, MALIGNANT, BLADDER 04/10/2009  . HYPERLIPIDEMIA 04/10/2009  . ANEMIA-NOS 04/10/2009  . 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 ANXIETY 04/10/2009  . HYPERTENSION 04/10/2009  . Atrial fibrillation 04/10/2009  . COPD 04/10/2009  . TRANSIENT ISCHEMIC ATTACK, HX OF 04/10/2009  . Bladder cancer     Dr. Lawerance Bach  . Lacunar stroke 04/07/2011  . Pleural effusion 04/07/2011  . 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 Stroke     Past Surgical History  Procedure Laterality Date  . Splenectomy    . Bilateral vats ablation    . Facial cancer      facial skin cancer    Mikey College MS, RD, LDN [phone] Pager [phone] After Hours Pager

## 2014-01-11 NOTE — IPOC Note (Signed)
Overall Plan of Care [facility]) Patient Details Name: [name] MRN: [id number] DOB: 16-Jun-1927  Admitting Diagnosis: Deconditioned  Hospital Problems: Active Problems:   CVA (cerebral infarction)     Functional Problem List: Nursing Bladder;Bowel;Endurance;Motor;Nutrition;Medication Management;Skin Integrity  PT Balance;Endurance;Motor;Sensory  OT Balance;Cognition;Endurance;Motor;Perception;Safety;Sensory;Vision  SLP Cognition;Linguistic  TR         Basic ADL's: OT Grooming;Bathing;Dressing;Toileting;Eating     Advanced  ADL's: OT       Transfers: PT Bed Mobility;Bed to Chair;Car;Furniture  OT Toilet;Tub/Shower     Locomotion: PT Ambulation;Wheelchair Mobility;Stairs     Additional Impairments: OT    SLP Swallowing;Communication;Social Cognition comprehension;expression Problem Solving;Memory;Attention;Awareness  TR      Anticipated Outcomes Item Anticipated Outcome  Self Feeding supervision  Swallowing  Supervision with least restrictive PO   Basic self-care  supervision   Toileting  supervision    Bathroom Transfers supervision  Bowel/Bladder  min assist  Transfers  Supervision to Min A (min guard)  Locomotion  Supervision to Min A (min guard)  Communication  Min  Cognition  Min  Pain  <3  Safety/Judgment  min assist   Therapy Plan: PT Intensity: Minimum of 1-2 x/day ,45 to 90 minutes PT Frequency: 5 out of 7 days PT Duration Estimated Length of Stay: 14-18 days OT Intensity: Minimum of 1-2 x/day, 45 to 90 minutes OT Frequency: 5 out of 7 days OT Duration/Estimated Length of Stay: 21-25 days SLP Intensity: Minumum of 1-2 x/day, 30 to 90 minutes SLP Frequency: 5 out of 7 days SLP Duration/Estimated Length of Stay: 21-25 days       Team Interventions: Nursing Interventions Patient/Family Education;Bladder Management;Bowel Management;Disease Management/Prevention;Medication Management;Dysphagia/Aspiration Precaution Training;Discharge  Planning;Psychosocial Support  PT interventions Ambulation/gait training;Balance/vestibular training;Cognitive remediation/compensation;Disease management/prevention;Discharge planning;DME/adaptive equipment instruction;Functional mobility training;Patient/family education;Pain management;Neuromuscular re-education;UE/LE Coordination activities;Wheelchair propulsion/positioning;UE/LE Strength taining/ROM;Stair training;Therapeutic Activities;Therapeutic Exercise  OT Interventions Balance/vestibular training;Cognitive remediation/compensation;Community reintegration;Discharge planning;DME/adaptive equipment instruction;Functional mobility training;Pain management;Patient/family education;Neuromuscular re-education;Psychosocial support;Self Care/advanced ADL retraining;Therapeutic Activities;Therapeutic Exercise;UE/LE Strength taining/ROM;UE/LE Coordination activities;Visual/perceptual remediation/compensation  SLP Interventions Cognitive remediation/compensation;Cueing hierarchy;Dysphagia/aspiration precaution training;Environmental controls;Functional tasks;Internal/external aids;Patient/family education;Speech/Language facilitation;Oral motor exercises  TR Interventions    SW/CM Interventions      Team Discharge Planning: Destination: PT-Home ,OT- Home , SLP-Home Projected Follow-up: PT-Home health PT, OT-  Home health OT, SLP-Home Health SLP;24 hour supervision/assistance Projected Equipment Needs: PT-Rolling walker with 5" wheels;To be determined, OT- 3 in 1 bedside comode;Tub/shower bench, SLP-None recommended by SLP Equipment Details: PT- , OT-  Patient/family involved in discharge planning: PT- Patient;Family member/caregiver,  OT-Patient, SLP-Family member/caregiver  MD ELOS: 21-25 days Medical Rehab Prognosis:  Good Assessment: Recent Rehab admit for Left parietal infarct.While at [facility] 01/05/14, he was completing his shower when he developed acute onset of right sided weakness with speech  difficulty. Code stroke was initiated and he was transferred to acute services. MRI 01/06/2014 revealed Left PCA distribution infarct  Readmitted for CIR.  Now requiring 24/7 Rehab RN,MD, as well as CIR level PT, OT and SLP.  Treatment team will focus on ADLs and mobility with goals set at [place]      See Team Conference Notes for weekly updates to the plan of care

## 2014-01-11 NOTE — Progress Notes (Signed)
Physical Therapy Session Note  Patient Details  Name: [name] MRN: [id number] Date of Birth: 1927/04/26  Today's Date: 01/11/2014 Time: 0900-1005 Time Calculation (min): 65 min   Skilled Therapeutic Interventions/Progress Updates:  Patient sitting in wheelchair finishing breakfast upon entering room. Patient pushed in wheelchair to gym. Patient max assist (lift and lower) for sit to stand and stand pivot with rolling walker from wheelchair to mat. Patient worked on scooting side to side on mat. Patient sit to supine with min assist. Patient performed LE exercise working on strength and control including heel slides, hip abduction/adduction, and SAQ's x 10 reps each bilaterally. Patient required occasional active assist with left LE especially with hip abduction. Patient supine to sit with mod assist - some lifting assist at trunk. Patient max assist sit to stand and ambulated with RW and mod assist 25 feet x 1 and 32 feet x 1. Patient required facilitation to weight shift to left and cueing to pick up right LE - increase hip flexion and stride on right. Patient ambulated up and down 3 steps with bilateral rails and mod assist and verbal cueing for sequencing. Patient transferred wheelchair to recliner with mod assist for sit to stand. Patient left in recliner with wife in room to assist with needs.  Therapy Documentation Precautions:  Precautions Precautions: Fall Precaution Comments: Sensory deficits on R side; may push to R Restrictions Weight Bearing Restrictions: No Pain: Pain Assessment Pain Assessment: No/denies pain Pain Score: 0-No pain  Locomotion : Ambulation Ambulation/Gait Assistance: 3: Mod assist   See FIM for current functional status  Therapy/Group: Individual Therapy  [name] 01/11/2014, 10:53 AM

## 2014-01-11 NOTE — Progress Notes (Signed)
Speech Language Pathology Daily Session Notes  Patient Details  Name: [name] MRN: [id number] Date of Birth: 1927-02-23  Today's Date: 01/11/2014 Time #1: 6073-7106 Time Calculation (min): 45 min  Time #2: 2694-8546 Time Calculation (min): 45 min   Short Term Goals: Week 1: SLP Short Term Goal 1 (Week 1): Pt will consume Dys 1 textures and honey thick liquids, utilizing safe swallowing strategies with Mod cues SLP Short Term Goal 2 (Week 1): Pt will consume therapeutic trials of nectar thick liquid with minimal overt s/s of aspiration to determine readiness for diet advancement SLP Short Term Goal 3 (Week 1): Pt will sustain attention to basic functional task for 60 seconds with Max cues SLP Short Term Goal 4 (Week 1): Pt will use environmental clues to demonstrate orientation x4 with Max cues SLP Short Term Goal 5 (Week 1): Pt will verbally communicate wants/needs during functional task with Max cues SLP Short Term Goal 6 (Week 1): Pt will name common objects with 80% accuracy with Mod cues  Skilled Therapeutic Interventions: Session #1: Skilled co-treatment with OT in [name] focused on swallowing and cognitive-linguistic goals during self-feeding task. SLP facilitated session with skilled observation of lunch meal, consisting of Dys 1 textures and honey thick liquids via straw. Cough observed x1 throughout meal. Pt initially required Mod encouragement to engage in conversation, becoming increasingly conversant as the session progressed. Verbal expression was noted at the sentence level, with imprecise articulation and low vocal intensity. Pt attended to his right visual field with supervision level question cues.    Session #2: Skilled treatment focused on cognitive-linguistic goals. SLP facilitated session with confrontational task. Pt named common objects with ~75% accuracy with Min phonemic and sentence completion cues. He participated in categorical naming task with Max cues  to generate 10 words. Pt sustained attention to linguistic tasks with Mod cues. SLP provided Mod cues for increased vocal intensity to increase intelligibility. Verbal expression was noted at primarily the phrase level this afternoon. Continue plan of care.  FIM:  Comprehension Comprehension Mode: Auditory Comprehension: 4-Understands basic 75 - 89% of the time/requires cueing 10 - 24% of the time Expression Expression Mode: Verbal Expression: 3-Expresses basic 50 - 74% of the time/requires cueing 25 - 50% of the time. Needs to repeat parts of sentences. Social Interaction Social Interaction: 4-Interacts appropriately 75 - 89% of the time - Needs redirection for appropriate language or to initiate interaction. Problem Solving Problem Solving: 2-Solves basic 25 - 49% of the time - needs direction more than half the time to initiate, plan or complete simple activities Memory Memory: 2-Recognizes or recalls 25 - 49% of the time/requires cueing 51 - 75% of the time FIM - Eating Eating Activity: 4: Helper occasionally scoops food on utensil;4: Helper occasionally brings food to mouth;4: Help with picking up utensils;4: Help with managing cup/glass  Pain  No/denies pain  Therapy/Group: Individual Therapy and Group Therapy   Germain Osgood, M.A. CCC-SLP [phone]   Germain Osgood 01/11/2014, 4:39 PM

## 2014-01-11 NOTE — Progress Notes (Signed)
Occupational Therapy Session Note  Patient Details  Name: [name] MRN: [id number] Date of Birth: February 03, 1927  Today's Date: 01/11/2014 Time: 0730-0830 Time Calculation (min): 60 min  Short Term Goals: Week 1:  OT Short Term Goal 1 (Week 1): Patient will complete LB dressing with mod assist and mod cues OT Short Term Goal 2 (Week 1): Patient will complete bathing with mod assist and mod cueing OT Short Term Goal 3 (Week 1): Patient will complete toilet transfer with min assist OT Short Term Goal 4 (Week 1): Patient will complete UB dressing with min assist OT Short Term Goal 5 (Week 1): Pt demonstrate improved postural control in sitting at midline with min assist for approx 1 min   Skilled Therapeutic Interventions/Progress Updates:    Pt seen for ADL retraining with focus on cognition, visual spatial deficits, postural control, and body awareness. Completed bathing while sitting unsupported EOB requiring min-supervision for sitting balance as pt demonstrates lateral lean to right with decreased awareness. Cues and manual facilitation provided for extension as pt with cervical flexion and bil shoulder protraction. Provided visual cue in room as target for posture and pt sustained approx 3-5 seconds. Cues provided for pt to visually attend to RUE while using it and apraxic movements noted with visual spatial deficits as well. Therapist provided [facility] assist at times to RUE then had pt use self HOH assist on 2 occasions. Pt completed sit<>stands with mod assist and cues for pushing up from surface. Pt required increased processing time throughout session and followed 2 step commands approx 50% of time. Wife present throughout session. At end of session pt left sitting in w/c with all needs in reach.   Therapy Documentation Precautions:  Precautions Precautions: Fall Precaution Comments: Sensory deficits on R side; may push to R Restrictions Weight Bearing Restrictions: No General:   Vital  Signs: Therapy Vitals Temp: 98.1 F (36.7 C) Temp src: Oral Pulse Rate: 115 Resp: 18 BP: 136/85 mmHg Patient Position, if appropriate: Lying Oxygen Therapy SpO2: 99 % O2 Device: None (Room air) Pain: Pt reports "yes" to pain however unable to determine location secondary to cognitive linguistic deficits.   See FIM for current functional status  Therapy/Group: Individual Therapy  Duayne Cal 01/11/2014, 8:43 AM

## 2014-01-11 NOTE — Progress Notes (Signed)
Occupational Therapy Session Note  Patient Details  Name: [name] MRN: [id number] Date of Birth: 10-10-1927  Today's Date: 01/11/2014 Time: 1215-1230 Time Calculation (min): 15 min  Skilled Therapeutic Interventions/Progress Updates:    Patient participated in [employer] today.  Patient very eager to utilize his right upper extremity to feed himself, although with limited strength and fine motor coordination to manipulate utensil within hand, and change orientation of utensil to bring to mouth.  Patient with some success with backward chaining, if fork loaded with food, elbow manually supported on arm rest, fork placed in correct orientation to mouth, patient able to complete fork to mouth pattern.  Patient did not want red foam on fork today, and had difficulty changing his preferred grasp pattern to use it.  Encouraged patient to use right hand for a percentage of his meal (30%) to ensure he still received adequate nutrition each meal.   Therapy Documentation Precautions:  Precautions Precautions: Fall Precaution Comments: Sensory deficits on R side; may push to R Restrictions Weight Bearing Restrictions: No   Pain:  Patient without report of pain  See FIM for current functional status  Therapy/Group: Group Therapy  Mariah Milling 01/11/2014, 6:17 PM

## 2014-01-12 ENCOUNTER — Inpatient Hospital Stay (HOSPITAL_COMMUNITY): Payer: Medicare Other | Admitting: Occupational Therapy

## 2014-01-12 ENCOUNTER — Inpatient Hospital Stay (HOSPITAL_COMMUNITY): Payer: Medicare Other | Admitting: Physical Therapy

## 2014-01-12 ENCOUNTER — Inpatient Hospital Stay (HOSPITAL_COMMUNITY): Payer: Medicare Other | Admitting: Speech Pathology

## 2014-01-12 MED ORDER — SENNOSIDES-DOCUSATE SODIUM 8.6-50 MG PO TABS
2.0000 | ORAL_TABLET | Freq: Every day | ORAL | Status: DC
  Administered 2014-01-12 – 2014-01-15 (×4): 2 via ORAL
  Filled 2014-01-12 (×4): qty 2

## 2014-01-12 MED ORDER — DILTIAZEM HCL 60 MG PO TABS
60.0000 mg | ORAL_TABLET | Freq: Three times a day (TID) | ORAL | Status: DC
  Administered 2014-01-12 – 2014-01-31 (×56): 60 mg via ORAL
  Filled 2014-01-12 (×63): qty 1

## 2014-01-12 NOTE — Progress Notes (Signed)
80 y.o. 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 He was admitted 12/25/2013 with altered mental status and low grade fever of 100 Fahrenheit and reported hematuria. Cranial CT scan negative and MRI of the brain showed punctate acute infarct in the high left parietal lobe as well as extensive chronic ischemic changes. Patient's Xarelto held for a short time secondary to hematuria. He was started on IV rocephin due to SIRS as well as Tamiflu as he tested positive for type A influenza. He was placed on Dysphagia 1 nectar thick liquid diet per ST recommendations. Patient with difficulty with processing with easy distractibility, as well as problems with initiation. Needs tactile cues for basic tasks and mobility. Mentation improving and neurology recommends resuming Xarelto for embolic stroke due to A Fib  On 01/05/14, he was completing his shower when he developed acute onset of right sided weakness with speech difficulty. Code stroke was initiated and he was transferred to acute services. MRI 01/06/2014 revealed Left PCA distribution infarct  Subjective/Complaints: Had good day in PT yest went up 3 steps with assist, constipated, can't swallow pills whole  Review of Systems - difficult to obtain secondary to aphasia and reduced attn Objective: Vital Signs: Blood pressure 130/78, pulse 112, temperature 98.2 F (36.8 C), temperature source Oral, resp. rate 17, height 6' 2.02" (1.88 m), weight 56.8 kg (125 lb 3.5 oz), SpO2 99.00%. No results found. Results for orders placed during the hospital encounter of 01/09/14 (from the past 72 hour(s))  CBC WITH DIFFERENTIAL     Status: Abnormal   Collection Time    01/10/14  4:52 AM      Result Value Range   WBC 6.4  4.0 - 10.5 K/uL   RBC 3.04 (*) 4.22 - 5.81 MIL/uL   Hemoglobin 10.0 (*) 13.0 - 17.0 g/dL   HCT 29.5 (*) 39.0 -  52.0 %   MCV 97.0  78.0 - 100.0 fL   MCH 32.9  26.0 - 34.0 pg   MCHC 33.9  30.0 - 36.0 g/dL   RDW 14.4  11.5 - 15.5 %   Platelets 321  150 - 400 K/uL   Neutrophils Relative % 71  43 - 77 %   Neutro Abs 4.6  1.7 - 7.7 K/uL   Lymphocytes Relative 15  12 - 46 %   Lymphs Abs 0.9  0.7 - 4.0 K/uL   Monocytes Relative 13 (*) 3 - 12 %   Monocytes Absolute 0.8  0.1 - 1.0 K/uL   Eosinophils Relative 1  0 - 5 %   Eosinophils Absolute 0.1  0.0 - 0.7 K/uL   Basophils Relative 0  0 - 1 %   Basophils Absolute 0.0  0.0 - 0.1 K/uL  COMPREHENSIVE METABOLIC PANEL     Status: Abnormal   Collection Time    01/10/14  4:52 AM      Result Value Range   Sodium 141  137 - 147 mEq/L   Potassium 4.5  3.7 - 5.3 mEq/L   Chloride 104  96 - 112 mEq/L   CO2 26  19 - 32 mEq/L   Glucose, Bld 90  70 - 99 mg/dL   BUN 18  6 - 23 mg/dL   Creatinine, Ser 0.87  0.50 - 1.35 mg/dL   Calcium 8.7  8.4 - 10.5 mg/dL   Total Protein 6.1  6.0 -  8.3 g/dL   Albumin 2.3 (*) 3.5 - 5.2 g/dL   AST 27  0 - 37 U/L   ALT 15  0 - 53 U/L   Alkaline Phosphatase 83  39 - 117 U/L   Total Bilirubin 0.2 (*) 0.3 - 1.2 mg/dL   GFR calc non Af Amer 77 (*) >90 mL/min   GFR calc Af Amer 89 (*) >90 mL/min   Comment: (NOTE)     The eGFR has been calculated using the CKD EPI equation.     This calculation has not been validated in all clinical situations.     eGFR's persistently <90 mL/min signify possible Chronic Kidney     Disease.      Constitutional: He appears well-developed and well-nourished.  HENT:  Head: Normocephalic and atraumatic.  Eyes: Conjunctivae and EOM are normal. Pupils are equal, round, and reactive to light.  Neck: Normal range of motion. Neck supple.  Cardiovascular: Normal rate and normal heart sounds. An irregularly irregular rhythm present.  Respiratory: Effort normal. No respiratory distress. He has decreased breath sounds in the right lower field and the left lower field.  Neurological: He is alert. He exhibits  normal muscle tone.  Motor strength is 3 minus right deltoid, bicep, tricep, grip, right ankle dorsiflexor 4 minus right hip flexor knee extensor 5/5 left deltoid, bicep, tricep, grip, hip flexors, knee extensors, ankle dorsiflexor plantar flexor   Assessment/Plan: 1. Functional deficits secondary to L PCA embolic infarct 0/97/3532 superimposed on L high parietal infarct onset 12/25/2013 which require 3+ hours per day of interdisciplinary therapy in a comprehensive inpatient rehab setting. Physiatrist is providing close team supervision and 24 hour management of active medical problems listed below. Physiatrist and rehab team continue to assess barriers to discharge/monitor patient progress toward functional and medical goals. FIM: FIM - Bathing Bathing Steps Patient Completed: Chest;Right Arm;Left Arm;Abdomen;Right upper leg;Left upper leg;Front perineal area Bathing: 3: Mod-Patient completes 5-7 60f 10 parts or 50-74%  FIM - Upper Body Dressing/Undressing Upper body dressing/undressing steps patient completed: Put head through opening of pull over shirt/dress;Thread/unthread left sleeve of pullover shirt/dress (donned 2 shirts) Upper body dressing/undressing: 2: Max-Patient completed 25-49% of tasks FIM - Lower Body Dressing/Undressing Lower body dressing/undressing steps patient completed: Thread/unthread left pants leg Lower body dressing/undressing: 2: Max-Patient completed 25-49% of tasks        FIM - Control and instrumentation engineer Devices: Walker;Arm rests Bed/Chair Transfer: 3: Supine > Sit: Mod A (lifting assist/Pt. 50-74%/lift 2 legs;4: Sit > Supine: Min A (steadying pt. > 75%/lift 1 leg);2: Bed > Chair or W/C: Max A (lift and lower assist);2: Chair or W/C > Bed: Max A (lift and lower assist)  FIM - Locomotion: Wheelchair Distance: 8 Locomotion: Wheelchair: 1: Travels less than 50 ft with minimal assistance (Pt.>75%) FIM - Locomotion: Ambulation Locomotion:  Ambulation Assistive Devices: Administrator Ambulation/Gait Assistance: 3: Mod assist Locomotion: Ambulation: 1: Travels less than 50 ft with moderate assistance (Pt: 50 - 74%)  Comprehension Comprehension Mode: Auditory Comprehension: 4-Understands basic 75 - 89% of the time/requires cueing 10 - 24% of the time  Expression Expression Mode: Verbal Expression: 3-Expresses basic 50 - 74% of the time/requires cueing 25 - 50% of the time. Needs to repeat parts of sentences.  Social Interaction Social Interaction: 4-Interacts appropriately 75 - 89% of the time - Needs redirection for appropriate language or to initiate interaction.  Problem Solving Problem Solving: 2-Solves basic 25 - 49% of the time - needs direction more  than half the time to initiate, plan or complete simple activities  Memory Memory: 2-Recognizes or recalls 25 - 49% of the time/requires cueing 51 - 75% of the time   Medical Problem List and Plan:  1. DVT Prophylaxis/Anticoagulation: Pharmaceutical: Xarelto  2. Pain Management: tylenol  3. Mood: monitor for depression, encourage interaction  4. Neuropsych: This patient is not capable of making decisions on his own behalf.  5. Afib Xarelto  6. Resp- recent Influenza and hx COPD- IS,monitor   LOS (Days) 3 A FACE TO FACE EVALUATION WAS PERFORMED  [name] E 01/12/2014, 9:16 AM

## 2014-01-12 NOTE — Progress Notes (Signed)
Occupational Therapy Session Note  Patient Details  Name: [name] MRN: [id number] Date of Birth: [birth year]  Today's Date: 01/12/2014  1st sessionTime: 0165-5374 Time Calculation (min): 60 min  Skilled Therapeutic Interventions/Progress Updates: ADL in w/c at sink with focus on receptive communication skills and cognitive processing to incorpate right UE for bilateral tasks as well as dynamic standing tasks.   Son and wife supportive and assisted as needed.   2nd session: 11-12;   Time=60 min Skilled Therapeutic Interventions/Progress Updates: Patient wanted to toilet and brush teeth; so, he completed both tasks for his pm session with [facility] assist for R for oral care.  This clinician completed suction regularly for safe swallowing.   Patient able to complete cleansing after BM and maintain balance as he bent forward to complete the process.    Therapy Documentation Precautions:  Precautions Precautions: Fall Precaution Comments: Sensory deficits on R side; may push to R Restrictions Weight Bearing Restrictions: No  Pain: Pain Assessment Pain Assessment: No/denies pain  See FIM for current functional status  Therapy/Group: Individual Therapy  Alfredia Ferguson [facility] 01/12/2014, 5:52 PM

## 2014-01-12 NOTE — Progress Notes (Signed)
Incontinent of urine during the night. PVR=12cc's. [name] A

## 2014-01-12 NOTE — Progress Notes (Signed)
Speech Language Pathology Daily Session Note  Patient Details  Name: [name] MRN: [id number] Date of Birth: 1927/01/13  Today's Date: 01/12/2014 Time: 1696-7893 Time Calculation (min): 45 min  Short Term Goals: Week 1: SLP Short Term Goal 1 (Week 1): Pt will consume Dys 1 textures and honey thick liquids, utilizing safe swallowing strategies with Mod cues SLP Short Term Goal 2 (Week 1): Pt will consume therapeutic trials of nectar thick liquid with minimal overt s/s of aspiration to determine readiness for diet advancement SLP Short Term Goal 3 (Week 1): Pt will sustain attention to basic functional task for 60 seconds with Max cues SLP Short Term Goal 4 (Week 1): Pt will use environmental clues to demonstrate orientation x4 with Max cues SLP Short Term Goal 5 (Week 1): Pt will verbally communicate wants/needs during functional task with Max cues SLP Short Term Goal 6 (Week 1): Pt will name common objects with 80% accuracy with Mod cues  Skilled Therapeutic Interventions: Therapeutic intervention complete with short term goals addressed.  The patient was given a few trials of nectar thick liquids via spoon.  Patient had wet voice, required double swallow to clear.  He was able to attend to task for approximately 3 minutes with verbal cues and was able to name common objects with 75% accuracy.  Also worked on increasing voice intensity which required max verbal cues.  Max verbal cues to swallow secretions to decrease drooling.  Continue with current treatment plan.    FIM:  Comprehension Comprehension Mode: Auditory Social Interaction Social Interaction: 4-Interacts appropriately 75 - 89% of the time - Needs redirection for appropriate language or to initiate interaction. Problem Solving Problem Solving: 2-Solves basic 25 - 49% of the time - needs direction more than half the time to initiate, plan or complete simple activities FIM - Eating Eating Activity: 3: Helper brings food to  mouth every scoop  Pain Pain Assessment Pain Assessment: No/denies pain  Therapy/Group: Individual Therapy  Frances Maywood 01/12/2014, 4:02 PM

## 2014-01-12 NOTE — Progress Notes (Signed)
Physical Therapy Session Note  Patient Details  Name: [name] MRN: [id number] Date of Birth: [birth year]  Today's Date: 01/12/2014 Time: 1000-1100 Time Calculation (min): 60 min  Therapy Documentation Precautions:  Precautions Precautions: Fall Precaution Comments: Sensory deficits on R side; may push to R Restrictions Weight Bearing Restrictions: No Pain:denies pain Patient expressing need to use bathroom and was assisted with transfers and hygiene.  Therapeutic Activity:(15') Transfers w/c<->toilet with Mod-assist, transfers sit<->stand with min/Mod-assist but need for frequent tactile and verbal cueing for safety and positioning. During transfers, patient tends keep trunk from leaning forward which results in inability to transfer to standing and causes feet to slide outward. With verbal and tactile cueing to flex trunk forward, patient able to perform transfer sit->stand with min-assist. Patient needed verbal and tactile cueing for hand placement with sit<->stand transfers.  Therapeutic Exercise:(15') Nu-Step Level 3 x 10' with rest breaks. Patient able to sustain grip with R hand on handle up to 2 minutes and then would take a break with hand.  Gait Training:(15') 4 x 25' using RW with Mod-assist as patient tends to lean back and to the right. Patient has difficulty being able to advance R LE and needed frequent verbal cue-ing to lift R LE up high and this improved his overall mobility and safety.    Therapy/Group: Individual Therapy  Abhay Godbolt J 01/12/2014, 10:14 AM

## 2014-01-13 ENCOUNTER — Ambulatory Visit (HOSPITAL_COMMUNITY): Payer: Medicare Other | Admitting: *Deleted

## 2014-01-13 NOTE — Progress Notes (Signed)
80 y.o. right handed male with history of COPD, prior CVA 08/2012 with right sided weakness and dysphagia ( CIR stay) , A fib--Xarelto and bladder cancer with ongoing treatment at [facility]. He was admitted 12/25/2013 with altered mental status and low grade fever of 100 Fahrenheit and reported hematuria. Cranial CT scan negative and MRI of the brain showed punctate acute infarct in the high left parietal lobe as well as extensive chronic ischemic changes. Patient's Xarelto held for a short time secondary to hematuria. He was started on IV rocephin due to SIRS as well as Tamiflu as he tested positive for type A influenza. He was placed on Dysphagia 1 nectar thick liquid diet per ST recommendations. Patient with difficulty with processing with easy distractibility, as well as problems with initiation. Needs tactile cues for basic tasks and mobility. Mentation improving and neurology recommends resuming Xarelto for embolic stroke due to A Fib  On 01/05/14, he was completing his shower when he developed acute onset of right sided weakness with speech difficulty. Code stroke was initiated and he was transferred to acute services. MRI 01/06/2014 revealed Left PCA distribution infarct  Subjective/Complaints: Still on D1 honey thick diet  Review of Systems - difficult to obtain secondary to aphasia and reduced attn Objective: Vital Signs: Blood pressure 127/72, pulse 95, temperature 98.1 F (36.7 C), temperature source Oral, resp. rate 17, height 6' 2.02" (1.88 m), weight 56.8 kg (125 lb 3.5 oz), SpO2 96.00%. No results found. No results found for this or any previous visit (from the past 72 hour(s)).    Constitutional: He appears well-developed and well-nourished.  HENT:  Head: Normocephalic and atraumatic.  Eyes: Conjunctivae and EOM are normal. Pupils are equal, round, and reactive to light.  Neck: Normal range of motion. Neck supple.  Cardiovascular: Normal rate and normal heart sounds. An irregularly  irregular rhythm present.  Respiratory: Effort normal. No respiratory distress. He has decreased breath sounds in the right lower field and the left lower field.  Neurological: He is alert. He exhibits normal muscle tone.  Motor strength is 3 minus right deltoid, bicep, tricep, grip, right ankle dorsiflexor 4 minus right hip flexor knee extensor 5/5 left deltoid, bicep, tricep, grip, hip flexors, knee extensors, ankle dorsiflexor plantar flexor   Assessment/Plan: 1. Functional deficits secondary to L PCA embolic infarct 08/13/2992 superimposed on L high parietal infarct onset 12/25/2013 which require 3+ hours per day of interdisciplinary therapy in a comprehensive inpatient rehab setting. Physiatrist is providing close team supervision and 24 hour management of active medical problems listed below. Physiatrist and rehab team continue to assess barriers to discharge/monitor patient progress toward functional and medical goals. FIM: FIM - Bathing Bathing Steps Patient Completed: Chest;Right Arm;Abdomen;Right upper leg;Left upper leg Bathing: 3: Mod-Patient completes 5-7 2f 10 parts or 50-74%  FIM - Upper Body Dressing/Undressing Upper body dressing/undressing steps patient completed: Thread/unthread right sleeve of pullover shirt/dresss Upper body dressing/undressing: 2: Max-Patient completed 25-49% of tasks FIM - Lower Body Dressing/Undressing Lower body dressing/undressing steps patient completed: Thread/unthread right underwear leg;Thread/unthread left underwear leg;Thread/unthread right pants leg;Thread/unthread left pants leg Lower body dressing/undressing: 3: Mod-Patient completed 50-74% of tasks  FIM - Toileting Toileting steps completed by patient: Performs perineal hygiene Toileting Assistive Devices: Grab bar or rail for support Toileting: 2: Max-Patient completed 1 of 3 steps  FIM - Radio producer Devices: Grab bars Toilet Transfers: 4-To  toilet/BSC: Min A (steadying Pt. > 75%);4-From toilet/BSC: Min A (steadying Pt. > 75%)  FIM -  Bed/Chair Financial planner Devices: Environmental consultant;Arm rests Bed/Chair Transfer: 3: Supine > Sit: Mod A (lifting assist/Pt. 50-74%/lift 2 legs;4: Sit > Supine: Min A (steadying pt. > 75%/lift 1 leg);2: Bed > Chair or W/C: Max A (lift and lower assist);2: Chair or W/C > Bed: Max A (lift and lower assist)  FIM - Locomotion: Wheelchair Distance: 8 Locomotion: Wheelchair: 1: Travels less than 50 ft with minimal assistance (Pt.>75%) FIM - Locomotion: Ambulation Locomotion: Ambulation Assistive Devices: Administrator Ambulation/Gait Assistance: 3: Mod assist Locomotion: Ambulation: 1: Travels less than 50 ft with moderate assistance (Pt: 50 - 74%)  Comprehension Comprehension Mode: Auditory Comprehension: 4-Understands basic 75 - 89% of the time/requires cueing 10 - 24% of the time  Expression Expression Mode: Verbal Expression: 3-Expresses basic 50 - 74% of the time/requires cueing 25 - 50% of the time. Needs to repeat parts of sentences.  Social Interaction Social Interaction: 4-Interacts appropriately 75 - 89% of the time - Needs redirection for appropriate language or to initiate interaction.  Problem Solving Problem Solving: 2-Solves basic 25 - 49% of the time - needs direction more than half the time to initiate, plan or complete simple activities  Memory Memory: 2-Recognizes or recalls 25 - 49% of the time/requires cueing 51 - 75% of the time   Medical Problem List and Plan:  1. DVT Prophylaxis/Anticoagulation: Pharmaceutical: Xarelto  2. Pain Management: tylenol  3. Mood: monitor for depression, encourage interaction  4. Neuropsych: This patient is not capable of making decisions on his own behalf.  5. Afib Xarelto  6. Resp- recent Influenza and hx COPD- IS,monitor   LOS (Days) 4 A FACE TO FACE EVALUATION WAS PERFORMED  [name] 01/13/2014, 7:51 AM

## 2014-01-13 NOTE — Progress Notes (Signed)
Assessment:  [name] is an 80 yo man originally admitted on 12/30 for CVA, hematuria and flu. He was on Xarelto 15 mg daily, which was resumed, but pt. developed recurrent stroke on 1/10. His renal function (est. crcl ~ 50 ml/min) is borderline qualify for 20mg  daily, per record his PTA dose was 15mg  daily. Dr. Mare Ferrari had pt on lower dose due to his recurrent hematuria. Per Dr. Rosezella [place] note (1/14), the failure of this med could be because it was held for a short while for hematuria, and he recommenced to continue 15mg  daily for now. He is also on diltiazem now, which could inhibit metabolism of xarelto.   Plan:  1. Cont Xarelto 15 mg po q supper 2. F/u renal function, cbc, s/sx of bleeding  Thanks for allowing pharmacy to be a part of this patient's care.  Excell Seltzer, PharmD Clinical Pharmacist, [phone]

## 2014-01-13 NOTE — Progress Notes (Signed)
Scrotal area very red, microgaurd powder applied. Incontinent of urine. HS IV fluids. Patrici Ranks A

## 2014-01-14 ENCOUNTER — Inpatient Hospital Stay (HOSPITAL_COMMUNITY): Payer: Medicare Other

## 2014-01-14 ENCOUNTER — Inpatient Hospital Stay (HOSPITAL_COMMUNITY): Payer: Medicare Other | Admitting: Physical Therapy

## 2014-01-14 DIAGNOSIS — G8110 Spastic hemiplegia affecting unspecified side: Secondary | ICD-10-CM

## 2014-01-14 DIAGNOSIS — R5381 Other malaise: Secondary | ICD-10-CM

## 2014-01-14 DIAGNOSIS — I6340 Cerebral infarction due to embolism of unspecified cerebral artery: Secondary | ICD-10-CM

## 2014-01-14 DIAGNOSIS — I69920 Aphasia following unspecified cerebrovascular disease: Secondary | ICD-10-CM

## 2014-01-14 NOTE — Progress Notes (Signed)
Occupational Therapy Session Co-Treatment Note  Patient Details  Name: [name] MRN: [id number] Date of Birth: October 16, 1927  Today's Date: 01/14/2014 Time: 1200-1230 Time Calculation (min): 30 min  Skilled Therapeutic Interventions: Co-treatment with SLP in [place] this session with OT focus on use of R-UE for self-feeding. Patient continues to demo limited key pinch strength and fine motor coordination to manipulate utensil within hand, and to bring utensil to mouth. Patient with some success with backward chaining, if fork loaded with food.  Patient noted to lose focus on pinch strength specifically when he opens his mouth to received food.  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  Therapy Documentation Precautions:  Precautions Precautions: Fall Precaution Comments: Sensory deficits on R side; may push to R Restrictions Weight Bearing Restrictions: No   Pain: No report of pain   See FIM for current functional status  Therapy/Group: Co-Treatment  Latwan Luchsinger 01/14/2014, 3:41 PM

## 2014-01-14 NOTE — Progress Notes (Addendum)
Speech Language Pathology Daily Session Note  Patient Details  Name: [name] MRN: [id number] Date of Birth: [birth year]  Today's Date: 01/14/2014 Time #1: 1050-1120 Time Calculation (min): 30 min  Time #2: 1130-1200 Time Calculation (min): 30 min  Short Term Goals: Week 1: SLP Short Term Goal 1 (Week 1): Pt will consume Dys 1 textures and honey thick liquids, utilizing safe swallowing strategies with Mod cues SLP Short Term Goal 2 (Week 1): Pt will consume therapeutic trials of nectar thick liquid with minimal overt s/s of aspiration to determine readiness for diet advancement SLP Short Term Goal 3 (Week 1): Pt will sustain attention to basic functional task for 60 seconds with Max cues SLP Short Term Goal 4 (Week 1): Pt will use environmental clues to demonstrate orientation x4 with Max cues SLP Short Term Goal 5 (Week 1): Pt will verbally communicate wants/needs during functional task with Max cues SLP Short Term Goal 6 (Week 1): Pt will name common objects with 80% accuracy with Mod cues  Skilled Therapeutic Interventions: Session #1: Skilled treatment focused on cognitive-linguistic and speech goals. SLP facilitated session with Mod-Max cues for increased volume. Wife shared that pt has a h/o laryngeal cancer s/p surgical intervention ~20 years ago, and that while his voice is currently not as strong as it was, that he has spoken softly since that procedure. They deny any h/o radiation treatment. Pt required Max-Total A to complete 4-step sequencing task. He named pictures of common items with Min cues throughout task. Pt was oriented to time with Mod I, utilizing calendar as an external aid.    Session #2: Skilled co-treatment with OT focused on swallowing, communication, and self-feeding goals. SLP facilitated session with skilled observation of Dys 1 textures with advanced trials of nectar-thick liquids via cup sips. Pt demonstrated a wet vocal quality x2, which he self-corrected  with reflexive throat clears. Pt exhibited delayed coughing, several minutes after completion of all PO intake; question possible residuals, although cough appeared strong. Pt required Min cues for utilization of safe swallowing strategies throughout meal. Verbal expression was at the conversational level with Min word-finding errors noted. Mod cues were provided for increased vocal intensity. Continue plan of care.  FIM:  Comprehension Comprehension Mode: Auditory Comprehension: 4-Understands basic 75 - 89% of the time/requires cueing 10 - 24% of the time Expression Expression Mode: Verbal Expression: 3-Expresses basic 50 - 74% of the time/requires cueing 25 - 50% of the time. Needs to repeat parts of sentences. Social Interaction Social Interaction: 4-Interacts appropriately 75 - 89% of the time - Needs redirection for appropriate language or to initiate interaction. Problem Solving Problem Solving: 2-Solves basic 25 - 49% of the time - needs direction more than half the time to initiate, plan or complete simple activities Memory Memory: 2-Recognizes or recalls 25 - 49% of the time/requires cueing 51 - 75% of the time FIM - Eating Eating Activity: 4: Help with picking up utensils;4: Helper occasionally scoops food on utensil;5: Needs verbal cues/supervision  Pain  No/denies pain  Therapy/Group: Individual Therapy   Germain Osgood, M.A. CCC-SLP [phone]   Germain Osgood 01/14/2014, 3:03 PM

## 2014-01-14 NOTE — Progress Notes (Signed)
Occupational Therapy Session Note  Patient Details  Name: [name] MRN: [id number] Date of Birth: 12-21-1927  Today's Date: 01/14/2014 Time: 0815-0900 and 1345-1430 Time Calculation (min): 45 min and 45 min   Short Term Goals: Week 1:  OT Short Term Goal 1 (Week 1): Patient will complete LB dressing with mod assist and mod cues OT Short Term Goal 2 (Week 1): Patient will complete bathing with mod assist and mod cueing OT Short Term Goal 3 (Week 1): Patient will complete toilet transfer with min assist OT Short Term Goal 4 (Week 1): Patient will complete UB dressing with min assist OT Short Term Goal 5 (Week 1): Pt demonstrate improved postural control in sitting at midline with min assist for approx 1 min   Skilled Therapeutic Interventions/Progress Updates:    Session 1: Pt seen for ADL retraining with focus on postural control in sitting and standing, functional use of RUE, visual perceptual skills, and functional transfers. Pt received sitting EOB with wife present assisting with breakfast. Pt completed bathing EOB with supervision for postural control and increased attention to right side. Cues provided for wringing wash cloth out use bil hands and for attending to RUE when using in during self-care tasks. Increased control of RUE noted when visually attending to it. Pt with decreased sensation and unaware when he dropped wash cloth on 2 occasions. Pt overshooting and undershooting approx 50% of time when reaching for items. Pt required min-mod assist for sit<>stand and min assist for standing balance. Pt used RUE to assist with managing clothing up and cues provided to grab pants with R hand in front of waist then slide to back to pull up. Pt pleased with "some" improvement of use of RUE. At end of session pt ambulated to bathroom with RW and min-mod assist. Required assist for clothing management and min assist for balance. Completed oral care at sink with HOH assist to use toothbrush with  RUE. At end of session pt left sitting in w/c with wife present and all needs in reach.   Session 2: Pt seen for 1:1 OT session with focus on NMR to RUE. Engaged in weightbearing through [place] while in standing to remove checkers pieces. Pt then cued to place back on board using RUE. Pt unable to secondary to ataxia and impaired visual perceptual skills so continued weightbearing through RUE while placing pieces on board with LUE. Engaged in wiping table using wash cloth with RUE and emphasis on pushing weight through RUE to do so and visual scanning to R. From w/c level, practiced using weighted clothes pins to place at target. Pt required min assist at time for positioning and grip of clothes pins. At end of session pt returned to room and left in recliner chair with all needs in reach.   Therapy Documentation Precautions:  Precautions Precautions: Fall Precaution Comments: Sensory deficits on R side; may push to R Restrictions Weight Bearing Restrictions: No General:   Vital Signs:   Pain: No report of pain during therapy sessions.   See FIM for current functional status  Therapy/Group: Individual Therapy  Duayne Cal 01/14/2014, 12:20 PM

## 2014-01-14 NOTE — Progress Notes (Signed)
80 y.o. 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 He was admitted 12/25/2013 with altered mental status and low grade fever of 100 Fahrenheit and reported hematuria. Cranial CT scan negative and MRI of the brain showed punctate acute infarct in the high left parietal lobe as well as extensive chronic ischemic changes. Patient's Xarelto held for a short time secondary to hematuria. He was started on IV rocephin due to SIRS as well as Tamiflu as he tested positive for type A influenza. He was placed on Dysphagia 1 nectar thick liquid diet per ST recommendations. Patient with difficulty with processing with easy distractibility, as well as problems with initiation. Needs tactile cues for basic tasks and mobility. Mentation improving and neurology recommends resuming Xarelto for embolic stroke due to A Fib  On 01/05/14, he was completing his shower when he developed acute onset of right sided weakness with speech difficulty. Code stroke was initiated and he was transferred to acute services. MRI 01/06/2014 revealed Left PCA distribution infarct  Subjective/Complaints: Still on D1 honey thick diet, poor oral intake, freq awakening at noc due to urinary inc  Review of Systems - difficult to obtain secondary to aphasia and reduced attn Objective: Vital Signs: Blood pressure 144/81, pulse 104, temperature 98.1 F (36.7 C), temperature source Oral, resp. rate 18, height 6' 2.02" (1.88 m), weight 56.8 kg (125 lb 3.5 oz), SpO2 96.00%. No results found. No results found for this or any previous visit (from the past 72 hour(s)).    Constitutional: He appears well-developed and well-nourished.  HENT:  Head: Normocephalic and atraumatic.  Eyes: Conjunctivae and EOM are normal. Pupils are equal, round, and reactive to light.  Neck: Normal range of motion. Neck supple.   Cardiovascular: Normal rate and normal heart sounds. An irregularly irregular rhythm present.  Respiratory: Effort normal. No respiratory distress. He has decreased breath sounds in the right lower field and the left lower field.  Neurological: He is alert. He exhibits normal muscle tone.  Motor strength is 3 minus right deltoid, bicep, tricep, grip, right ankle dorsiflexor 4 minus right hip flexor knee extensor 5/5 left deltoid, bicep, tricep, grip, hip flexors, knee extensors, ankle dorsiflexor plantar flexor   Assessment/Plan: 1. Functional deficits secondary to L PCA embolic infarct 07/02/2375 superimposed on L high parietal infarct onset 12/25/2013 which require 3+ hours per day of interdisciplinary therapy in a comprehensive inpatient rehab setting. Physiatrist is providing close team supervision and 24 hour management of active medical problems listed below. Physiatrist and rehab team continue to assess barriers to discharge/monitor patient progress toward functional and medical goals. FIM: FIM - Bathing Bathing Steps Patient Completed: Chest;Right Arm;Abdomen;Right upper leg;Left upper leg Bathing: 3: Mod-Patient completes 5-7 89f 10 parts or 50-74%  FIM - Upper Body Dressing/Undressing Upper body dressing/undressing steps patient completed: Thread/unthread right sleeve of pullover shirt/dresss Upper body dressing/undressing: 2: Max-Patient completed 25-49% of tasks FIM - Lower Body Dressing/Undressing Lower body dressing/undressing steps patient completed: Thread/unthread right underwear leg;Thread/unthread left underwear leg;Thread/unthread right pants leg;Thread/unthread left pants leg Lower body dressing/undressing: 3: Mod-Patient completed 50-74% of tasks  FIM - Toileting Toileting steps completed by patient: Performs perineal hygiene Toileting Assistive Devices: Grab bar or rail for support Toileting: 2: Max-Patient completed 1 of 3 steps  FIM - Sport and exercise psychologist Devices: Grab bars Toilet Transfers: 4-From toilet/BSC: Min A (steadying Pt. >  75%);4-To toilet/BSC: Min A (steadying Pt. > 75%)  FIM - Control and instrumentation engineer Devices: Walker;Arm rests Bed/Chair Transfer: 3: Supine > Sit: Mod A (lifting assist/Pt. 50-74%/lift 2 legs;4: Sit > Supine: Min A (steadying pt. > 75%/lift 1 leg);2: Bed > Chair or W/C: Max A (lift and lower assist);2: Chair or W/C > Bed: Max A (lift and lower assist)  FIM - Locomotion: Wheelchair Distance: 8 Locomotion: Wheelchair: 1: Travels less than 50 ft with minimal assistance (Pt.>75%) FIM - Locomotion: Ambulation Locomotion: Ambulation Assistive Devices: Administrator Ambulation/Gait Assistance: 3: Mod assist Locomotion: Ambulation: 1: Travels less than 50 ft with moderate assistance (Pt: 50 - 74%)  Comprehension Comprehension Mode: Auditory Comprehension: 4-Understands basic 75 - 89% of the time/requires cueing 10 - 24% of the time  Expression Expression Mode: Verbal Expression: 3-Expresses basic 50 - 74% of the time/requires cueing 25 - 50% of the time. Needs to repeat parts of sentences.  Social Interaction Social Interaction: 4-Interacts appropriately 75 - 89% of the time - Needs redirection for appropriate language or to initiate interaction.  Problem Solving Problem Solving: 2-Solves basic 25 - 49% of the time - needs direction more than half the time to initiate, plan or complete simple activities  Memory Memory: 2-Recognizes or recalls 25 - 49% of the time/requires cueing 51 - 75% of the time   Medical Problem List and Plan:  1. DVT Prophylaxis/Anticoagulation: Pharmaceutical: Xarelto  2. Pain Management: tylenol  3. Mood: monitor for depression, encourage interaction  4. Neuropsych: This patient is not capable of making decisions on his own behalf.  5. Afib Xarelto  6. Resp- recent Influenza and hx COPD- IS,monitor   LOS (Days) 5 A FACE TO FACE  EVALUATION WAS PERFORMED  KIRSTEINS,ANDREW E 01/14/2014, 6:38 AM

## 2014-01-14 NOTE — Progress Notes (Signed)
Physical Therapy Session Note  Patient Details  Name: [name] MRN: [id number] Date of Birth: June 23, 1927  Today's Date: 01/14/2014 Time: 1005-1045 Time Calculation (min): 40 min  Short Term Goals: Week 1:  PT Short Term Goal 1 (Week 1): Pt to perform supine<>sit with min A. PT Short Term Goal 2 (Week 1): Pt to perform bed<>chair transfer with min A using rolling walker. PT Short Term Goal 3 (Week 1): Pt will perform gait x75' in controlled environment with rolling walker and mod A. PT Short Term Goal 4 (Week 1): Pt will perform w/c mobility x50' in controlled environment with supervision.  Skilled Therapeutic Interventions/Progress Updates:    Pt received seated in w/c with wife present. Pt initially lethargic but agreeable to session. Oriented x4; no cueing required for utilization of visual aid to orient self to date. With cueing from wife, pt reports needing to urinate. Performed toilet transfer with min A, rolling walker. Dynamic standing balance x3 minutes while washing hands with intermittent UE support, min guard to min A. Sit<>stand from w/c with mod A. Performed gait x62' in controlled environment with rolling walker and min-mod A. Pt with effective between-session carryover of cues provided by PT last week to perform RLE "marching" to increase R step length, RLE clearance during swing phase. Therapist departed with pt seated in w/c with wife present and all needs within reach.  Therapy Documentation Precautions:  Precautions Precautions: Fall Precaution Comments: Sensory deficits on R side; may push to R Restrictions Weight Bearing Restrictions: No Vital Signs: Therapy Vitals Temp: 98 F (36.7 C) Temp src: Oral Pulse Rate: 88 Resp: 19 BP: 116/78 mmHg Patient Position, if appropriate: Sitting Oxygen Therapy SpO2: 99 % O2 Device: None (Room air) Pain:  Pt reports no pain during session. See FIM for current functional status  Therapy/Group: Individual  Therapy  [name], Malva Cogan 01/14/2014, 5:35 PM

## 2014-01-15 ENCOUNTER — Inpatient Hospital Stay (HOSPITAL_COMMUNITY): Payer: Medicare Other

## 2014-01-15 ENCOUNTER — Inpatient Hospital Stay (HOSPITAL_COMMUNITY): Payer: Medicare Other | Admitting: Physical Therapy

## 2014-01-15 NOTE — Progress Notes (Signed)
80 y.o. right handed male with history of COPD, prior CVA 08/2012 with right sided weakness and dysphagia ( CIR stay) , A fib--Xarelto and bladder cancer with ongoing treatment at [facility]. He was admitted 12/25/2013 with altered mental status and low grade fever of 100 Fahrenheit and reported hematuria. Cranial CT scan negative and MRI of the brain showed punctate acute infarct in the high left parietal lobe as well as extensive chronic ischemic changes. Patient's Xarelto held for a short time secondary to hematuria. He was started on IV rocephin due to SIRS as well as Tamiflu as he tested positive for type A influenza. He was placed on Dysphagia 1 nectar thick liquid diet per ST recommendations. Patient with difficulty with processing with easy distractibility, as well as problems with initiation. Needs tactile cues for basic tasks and mobility. Mentation improving and neurology recommends resuming Xarelto for embolic stroke due to A Fib  On 01/05/14, he was completing his shower when he developed acute onset of right sided weakness with speech difficulty. Code stroke was initiated and he was transferred to acute services. MRI 01/06/2014 revealed Left PCA distribution infarct  Subjective/Complaints: No new issues overnite no pain or cough, speech more intelligible  Review of Systems - difficult to obtain secondary to aphasia and reduced attn Objective: Vital Signs: Blood pressure 125/78, pulse 108, temperature 98 F (36.7 C), temperature source Oral, resp. rate 18, height 6' 2.02" (1.88 m), weight 56.8 kg (125 lb 3.5 oz), SpO2 96.00%. No results found. No results found for this or any previous visit (from the past 72 hour(s)).    Constitutional: He appears well-developed and well-nourished.  HENT:  Head: Normocephalic and atraumatic.  Eyes: Conjunctivae and EOM are normal. Pupils are equal, round, and reactive to light.  Neck: Normal range of motion. Neck supple.  Cardiovascular: Normal rate and  normal heart sounds. An irregularly irregular rhythm present.  Respiratory: Effort normal. No respiratory distress. He has decreased breath sounds in the right lower field and the left lower field.  Neurological: He is alert. He exhibits normal muscle tone.  Motor strength is 3 minus right deltoid, bicep, tricep, grip, right ankle dorsiflexor 4 minus right hip flexor knee extensor 5/5 left deltoid, bicep, tricep, grip, hip flexors, knee extensors, ankle dorsiflexor plantar flexor   Assessment/Plan: 1. Functional deficits secondary to L PCA embolic infarct 4/33/2951 superimposed on L high parietal infarct onset 12/25/2013 which require 3+ hours per day of interdisciplinary therapy in a comprehensive inpatient rehab setting. Physiatrist is providing close team supervision and 24 hour management of active medical problems listed below. Physiatrist and rehab team continue to assess barriers to discharge/monitor patient progress toward functional and medical goals. FIM: FIM - Bathing Bathing Steps Patient Completed: Chest;Right Arm;Abdomen;Right upper leg;Left upper leg;Left Arm;Front perineal area;Buttocks Bathing: 4: Min-Patient completes 8-9 63f 10 parts or 75+ percent  FIM - Upper Body Dressing/Undressing Upper body dressing/undressing steps patient completed: Thread/unthread left sleeve of pullover shirt/dress;Put head through opening of pull over shirt/dress Upper body dressing/undressing: 3: Mod-Patient completed 50-74% of tasks FIM - Lower Body Dressing/Undressing Lower body dressing/undressing steps patient completed: Thread/unthread right underwear leg;Thread/unthread left underwear leg;Thread/unthread right pants leg;Thread/unthread left pants leg;Pull pants up/down Lower body dressing/undressing: 3: Mod-Patient completed 50-74% of tasks  FIM - Toileting Toileting steps completed by patient: Performs perineal hygiene Toileting Assistive Devices: Grab bar or rail for support Toileting:  2: Max-Patient completed 1 of 3 steps  FIM - Radio producer Devices: Grab bars;Walker Toilet Transfers:  4-To toilet/BSC: Min A (steadying Pt. > 75%);4-From toilet/BSC: Min A (steadying Pt. > 75%)  FIM - Control and instrumentation engineer Devices: Walker;Arm rests Bed/Chair Transfer: 3: Bed > Chair or W/C: Mod A (lift or lower assist);3: Chair or W/C > Bed: Mod A (lift or lower assist)  FIM - Locomotion: Wheelchair Distance: 8 Locomotion: Wheelchair: 0: Activity did not occur FIM - Locomotion: Ambulation Locomotion: Ambulation Assistive Devices: Administrator Ambulation/Gait Assistance: 3: Mod assist Locomotion: Ambulation: 2: Travels 50 - 149 ft with moderate assistance (Pt: 50 - 74%)  Comprehension Comprehension Mode: Auditory Comprehension: 4-Understands basic 75 - 89% of the time/requires cueing 10 - 24% of the time  Expression Expression Mode: Verbal Expression: 3-Expresses basic 50 - 74% of the time/requires cueing 25 - 50% of the time. Needs to repeat parts of sentences.  Social Interaction Social Interaction: 4-Interacts appropriately 75 - 89% of the time - Needs redirection for appropriate language or to initiate interaction.  Problem Solving Problem Solving: 2-Solves basic 25 - 49% of the time - needs direction more than half the time to initiate, plan or complete simple activities  Memory Memory: 2-Recognizes or recalls 25 - 49% of the time/requires cueing 51 - 75% of the time   Medical Problem List and Plan:  1. DVT Prophylaxis/Anticoagulation: Pharmaceutical: Xarelto  2. Pain Management: tylenol  3. Mood: monitor for depression, encourage interaction  4. Neuropsych: This patient is not capable of making decisions on his own behalf.  5. Afib Xarelto  6. Resp- recent Influenza and hx COPD- IS,monitor   LOS (Days) 6 A FACE TO FACE EVALUATION WAS PERFORMED  Cherree Conerly E 01/15/2014, 6:27 AM

## 2014-01-15 NOTE — Plan of Care (Signed)
Problem: RH BLADDER ELIMINATION Goal: RH STG MANAGE BLADDER WITH ASSISTANCE STG Manage Bladder With Assistance- Min Outcome: Progressing Requesting transfer to bathroom this shift

## 2014-01-15 NOTE — Progress Notes (Signed)
Speech Language Pathology Daily Session Note  Patient Details  Name: [name] MRN: [id number] Date of Birth: 1927/10/05  Today's Date: 01/15/2014 Time: 1130-1215 Time Calculation (min): 45 min  Short Term Goals: Week 1: SLP Short Term Goal 1 (Week 1): Pt will consume Dys 1 textures and honey thick liquids, utilizing safe swallowing strategies with Mod cues SLP Short Term Goal 2 (Week 1): Pt will consume therapeutic trials of nectar thick liquid with minimal overt s/s of aspiration to determine readiness for diet advancement SLP Short Term Goal 3 (Week 1): Pt will sustain attention to basic functional task for 60 seconds with Max cues SLP Short Term Goal 4 (Week 1): Pt will use environmental clues to demonstrate orientation x4 with Max cues SLP Short Term Goal 5 (Week 1): Pt will verbally communicate wants/needs during functional task with Max cues SLP Short Term Goal 6 (Week 1): Pt will name common objects with 80% accuracy with Mod cues  Skilled Therapeutic Interventions: Skilled treatment focused on speech/language and swallowing goals. SLP facilitated session with Mod-Max cues for increased vocal intensity to increase intelligibility at the conversational level. Pt was engaged in communication at the conversational level with one instance of word-finding difficulty noted. Pt consumed therapeutic trials of Dys 2 textures and nectar thick liquids. He exhibited a mild delay in bolus formation and transit, with wet vocal quality noted after bites of Dys 2 textures. Wet vocal quality s/p cup sips of nectar thick liquids was noted x1, with pt reflexively clearing. Pt may be appropriate for upgrade to nectar thick liquids tomorrow with continued trials of dys 2 textures to assess readiness for solid advancement.   FIM:  Comprehension Comprehension Mode: Auditory Comprehension: 4-Understands basic 75 - 89% of the time/requires cueing 10 - 24% of the time Expression Expression Mode:  Verbal Expression: 3-Expresses basic 50 - 74% of the time/requires cueing 25 - 50% of the time. Needs to repeat parts of sentences. Social Interaction Social Interaction: 4-Interacts appropriately 75 - 89% of the time - Needs redirection for appropriate language or to initiate interaction. Problem Solving Problem Solving: 2-Solves basic 25 - 49% of the time - needs direction more than half the time to initiate, plan or complete simple activities Memory Memory: 2-Recognizes or recalls 25 - 49% of the time/requires cueing 51 - 75% of the time FIM - Eating Eating Activity: 4: Helper occasionally scoops food on utensil;4: Help with picking up utensils;5: Needs verbal cues/supervision  Pain Pain Assessment Pain Assessment: No/denies pain Pain Score: 0-No pain  Therapy/Group: Individual Therapy   Germain Osgood, M.A. CCC-SLP [phone]   Germain Osgood 01/15/2014, 1:38 PM

## 2014-01-15 NOTE — Plan of Care (Signed)
Problem: RH BOWEL ELIMINATION Goal: RH STG MANAGE BOWEL W/MEDICATION W/ASSISTANCE STG Manage Bowel with Medication with Assistance. min  Outcome: Not Progressing Requiring insertion of suppository

## 2014-01-15 NOTE — Progress Notes (Signed)
Occupational Therapy Session Note  Patient Details  Name: [name] MRN: [id number] Date of Birth: 02-05-1927  Today's Date: 01/15/2014 Time: 1004-1100 Time Calculation (min): 56 min  Short Term Goals: Week 1:  OT Short Term Goal 1 (Week 1): Patient will complete LB dressing with mod assist and mod cues OT Short Term Goal 2 (Week 1): Patient will complete bathing with mod assist and mod cueing OT Short Term Goal 3 (Week 1): Patient will complete toilet transfer with min assist OT Short Term Goal 4 (Week 1): Patient will complete UB dressing with min assist OT Short Term Goal 5 (Week 1): Pt demonstrate improved postural control in sitting at midline with min assist for approx 1 min   Skilled Therapeutic Interventions/Progress Updates:    Pt seen for ADL retraining with focus on postural control in sitting and standing, functional use of RUE, and standing balance. Pt received sitting in w/c and willing to take shower with min cues of encouragement from wife and therapist. Pt ambulated to bathroom using RW with min-mod assist and verbal cues. Completed bathing mod assist and using RUE approx 20% of task. Verbal cues provided to visually attend to RUE while using it. Pt dropping wash cloth on 2 occasions however demonstrated awareness of this for first time. Pt required min cues and min assist for postural control while in sitting as he was leaning to R with fatigue and during functional task with RUE. Completed dressing from w/c level. Cues provided to thread RUE first and increased carryover and use of RUE when visually attending to it when attempting to grip clothing items with R hand. Pt required min assist for all sit<>stands and standing balance. Pt donned R sock using bil hands without losing grip with R hand for first time. Pt and wife very motivated by this. At end of session pt left sitting in w/c with all needs in reach and wife present.   Therapy Documentation Precautions:   Precautions Precautions: Fall Precaution Comments: Sensory deficits on R side; may push to R Restrictions Weight Bearing Restrictions: No General:   Vital Signs:   Pain: No report of pain during therapy session.   See FIM for current functional status  Therapy/Group: Individual Therapy  Duayne Cal 01/15/2014, 12:18 PM

## 2014-01-15 NOTE — Progress Notes (Signed)
Physical Therapy Session Note  Patient Details  Name: [name] MRN: [id number] Date of Birth: August 03, 1927  Today's Date: 01/15/2014 Time: 0930-1000 [id number] Time Calculation (min):30 min and 75 min Short Term Goals: Week 1:  PT Short Term Goal 1 (Week 1): Pt to perform supine<>sit with min A. PT Short Term Goal 2 (Week 1): Pt to perform bed<>chair transfer with min A using rolling walker. PT Short Term Goal 3 (Week 1): Pt will perform gait x75' in controlled environment with rolling walker and mod A. PT Short Term Goal 4 (Week 1): Pt will perform w/c mobility x50' in controlled environment with supervision.  Skilled Therapeutic Interventions/Progress Updates:    Treatment Session 1: Pt received seated in w/c. Pt asleep but easily awakened. Session focused on increasing gait stability, increasing independence with w/c mobility. Pt performed sit<>stand transfer with rolling walker and mod A. Gait x116' in controlled environment with rolling walker and mod A; verbal cueing to increase R hip/knee flexion during RLE swing phase. With turning during gait, pt exhibits R trunk lean and RLE scissoring. Performed w/c mobility x70' with bilat UE's with min A to facilitate proper grip on R w/c hand rim (secondary to pt tendency to grasp entire w/c wheel). Therapist departed with pt seated in w/c with wife present and all needs within reach.  Treatment Session 2: Pt received seated on toilet with CNA present. Toilet transfer with rolling walker, mod A, grab bars. Self-propulsion of w/c x50' in controlled environment using bilat UE's with supervision. Once in gym, performed stand pivot from w/c>mat table (raised surface) with rolling walker and mod A; (raised) mat table>w/c with rolling walker and min A. Performed static/dynamic standing balance activities and RLE NMR. See below for detailed description of interventions.   L sidelying>sit transfer with supervision, HOB flat, no rails; no cues required. Nu  Step x5 minutes at level 3 using bilat UE/LE's to promote coordination of bilateral reciprocal UE/LE movement; multimodal cueing to facilitate full R knee extension. Gait x89' (to return to room) in controlled environment with rolling walker, min-mod A. When cued by wife, pt reports need to urinate. Toilet transfer with rolling walker, mod A, grab bars. Therapist departed with pt seated on toilet with wife present, pt in no apparent distress. Therapist notified CNA that wife would call for assistance with toilet transfer when pt was finished.  Therapy Documentation Precautions:  Precautions Precautions: Fall Precaution Comments: Sensory deficits on R side; may push to R Restrictions Weight Bearing Restrictions: No Vital Signs: Therapy Vitals Temp: 97.5 F (36.4 C) Temp src: Oral Pulse Rate: 108 Resp: 18 BP: 109/72 mmHg Patient Position, if appropriate: Sitting Oxygen Therapy SpO2: 98 % O2 Device: None (Room air) Pain:  Pt reports no pain during AM and PM sessions. Locomotion : Ambulation Ambulation/Gait Assistance: 3: Mod assist Wheelchair Mobility Distance: 50  Balance: Balance Balance Assessed: Yes Static Standing Balance Static Standing - Balance Support: No upper extremity supported Static Standing - Level of Assistance: 4: Min assist Static Standing - Comment/# of Minutes: Static standing with visual feedback via mirror (in front of pt) x7 minutes total. Verbal cues for upright posture secondary to pt tendency toward cervicothoracic flexion, downward gaze. Manual facilitation of upright posture at R axilla and L ribcage. Dynamic Standing Balance Dynamic Standing - Balance Support: No upper extremity supported Dynamic Standing - Level of Assistance: 3: Mod assist Dynamic Standing - Balance Activities: Lateral lean/weight shifting Dynamic Standing - Comments: Standing with mirror in front of pt for  visual feedback x8 minutes. M/L weightshifting, then lateral weightshifting  with contralateral step.  NMR: The following RLE NMR was performed using powder board with pt in L sidelying: R hip/knee flexion x20 reps, hip/knee extension x20 reps, manually-assisted R ankle dorsiflexion with concurrent R hip/knee flexion x15 reps, and resisted R ankle plantarflexion with concurrent hip/knee extension x10 reps. Tactile cueing at R hip flexors, hamstrings during RLE flexion at R gluteus maximus and quadriceps during RLE extension.  See FIM for current functional status  Therapy/Group: Individual Therapy  Monie Shere, Malva Cogan 01/15/2014, 4:10 PM

## 2014-01-16 ENCOUNTER — Inpatient Hospital Stay (HOSPITAL_COMMUNITY): Payer: Medicare Other

## 2014-01-16 ENCOUNTER — Inpatient Hospital Stay (HOSPITAL_COMMUNITY): Payer: Medicare Other | Admitting: Physical Therapy

## 2014-01-16 MED ORDER — SENNOSIDES-DOCUSATE SODIUM 8.6-50 MG PO TABS
2.0000 | ORAL_TABLET | Freq: Two times a day (BID) | ORAL | Status: DC
  Administered 2014-01-16 – 2014-01-31 (×31): 2 via ORAL
  Filled 2014-01-16 (×10): qty 2
  Filled 2014-01-16: qty 1
  Filled 2014-01-16 (×20): qty 2

## 2014-01-16 NOTE — Progress Notes (Signed)
Occupational Therapy Session Note  Patient Details  Name: [name] MRN: [id number] Date of Birth: [birth year]  Today's Date: 01/16/2014 Time: 0830-0930 Time Calculation (min): 60 min  Short Term Goals: Week 1:  OT Short Term Goal 1 (Week 1): Patient will complete LB dressing with mod assist and mod cues OT Short Term Goal 2 (Week 1): Patient will complete bathing with mod assist and mod cueing OT Short Term Goal 3 (Week 1): Patient will complete toilet transfer with min assist OT Short Term Goal 4 (Week 1): Patient will complete UB dressing with min assist OT Short Term Goal 5 (Week 1): Pt demonstrate improved postural control in sitting at midline with min assist for approx 1 min   Skilled Therapeutic Interventions/Progress Updates:    Session 1: Pt seen for ADL retraining with focus on initiation, postural control in standing, and functional use of RUE. Pt sitting EOB upon arrival with wife present and pt requesting to complete toilet task. Ambulated to bathroom with RW at mod assist and lateral lean to R with decreased awareness. Completed toilet task at max assist and min assist standing balance with cues for hand placement during sit<>stand. Pt required min cues for initiation of task and use of RUE to wash 40% of body. Pt dropped wash cloth 3x and aware of this. Completed dressing with increased time and improving use of RUE to assist with managing clothing around waist. Completed oral care using red tubing and pt demonstrating improved grip with RUE however required assist for turning toothbrush. At end of session pt practiced opening/closing toothbrush holder using BUE. Ataxic movements noted however pt completed task 3x. Encouraged pt to practice this task outside of therapy. Pt more motivated at end of session and left in w/c with all needs in reach and wife present.   Therapy Documentation Precautions:  Precautions Precautions: Fall Precaution Comments: Sensory deficits on R side;  may push to R Restrictions Weight Bearing Restrictions: No General:   Vital Signs:   Pain: No report of pain during therapy session.   See FIM for current functional status  Therapy/Group: Individual Therapy  Duayne Cal 01/16/2014, 10:49 AM

## 2014-01-16 NOTE — Progress Notes (Signed)
80 y.o. 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 He was admitted 12/25/2013 with altered mental status and low grade fever of 100 Fahrenheit and reported hematuria. Cranial CT scan negative and MRI of the brain showed punctate acute infarct in the high left parietal lobe as well as extensive chronic ischemic changes. Patient's Xarelto held for a short time secondary to hematuria. He was started on IV rocephin due to SIRS as well as Tamiflu as he tested positive for type A influenza. He was placed on Dysphagia 1 nectar thick liquid diet per ST recommendations. Patient with difficulty with processing with easy distractibility, as well as problems with initiation. Needs tactile cues for basic tasks and mobility. Mentation improving and neurology recommends resuming Xarelto for embolic stroke due to A Fib  On 01/05/14, he was completing his shower when he developed acute onset of right sided weakness with speech difficulty. Code stroke was initiated and he was transferred to acute services. MRI 01/06/2014 revealed Left PCA distribution infarct  Subjective/Complaints: Appetite ok, no pain c/os No SOB or cough Review of Systems - difficult to obtain secondary to aphasia and reduced attn Objective: Vital Signs: Blood pressure 140/82, pulse 110, temperature 97.7 F (36.5 C), temperature source Oral, resp. rate 18, height 6' 2.02" (1.88 m), weight 56.8 kg (125 lb 3.5 oz), SpO2 97.00%. No results found. No results found for this or any previous visit (from the past 72 hour(s)).    Constitutional: He appears well-developed and well-nourished.  HENT:  Head: Normocephalic and atraumatic.  Eyes: Conjunctivae and EOM are normal. Pupils are equal, round, and reactive to light.  Neck: Normal range of motion. Neck supple.  Cardiovascular: Normal rate and normal heart sounds. An  irregularly irregular rhythm present.  Respiratory: Effort normal. No respiratory distress. He has decreased breath sounds in the right lower field and the left lower field.  Neurological: He is alert. He exhibits normal muscle tone.  Motor strength is 3 minus right deltoid, bicep, tricep, grip, right ankle dorsiflexor 4 minus right hip flexor knee extensor 5/5 left deltoid, bicep, tricep, grip, hip flexors, knee extensors, ankle dorsiflexor plantar flexor   Assessment/Plan: 1. Functional deficits secondary to L PCA embolic infarct 3/81/0175 superimposed on L high parietal infarct onset 12/25/2013 which require 3+ hours per day of interdisciplinary therapy in a comprehensive inpatient rehab setting. Physiatrist is providing close team supervision and 24 hour management of active medical problems listed below. Physiatrist and rehab team continue to assess barriers to discharge/monitor patient progress toward functional and medical goals. FIM: FIM - Bathing Bathing Steps Patient Completed: Chest;Right Arm;Abdomen;Right upper leg;Left upper leg;Left Arm;Front perineal area Bathing: 3: Mod-Patient completes 5-7 64f 10 parts or 50-74%  FIM - Upper Body Dressing/Undressing Upper body dressing/undressing steps patient completed: Thread/unthread left sleeve of pullover shirt/dress;Put head through opening of pull over shirt/dress Upper body dressing/undressing: 3: Mod-Patient completed 50-74% of tasks FIM - Lower Body Dressing/Undressing Lower body dressing/undressing steps patient completed: Thread/unthread right underwear leg;Thread/unthread left underwear leg;Thread/unthread right pants leg;Thread/unthread left pants leg;Don/Doff right sock Lower body dressing/undressing: 3: Mod-Patient completed 50-74% of tasks  FIM - Toileting Toileting steps completed by patient: Performs perineal hygiene Toileting Assistive Devices: Grab bar or rail for support Toileting: 2: Max-Patient completed 1 of 3  steps  FIM - Radio producer Devices: Grab bars Toilet Transfers: 3-To toilet/BSC: Mod A (  lift or lower assist);3-From toilet/BSC: Mod A (lift or lower assist)  FIM - Bed/Chair Transfer Bed/Chair Transfer Assistive Devices: Copy: 4: Bed > Chair or W/C: Min A (steadying Pt. > 75%);4: Chair or W/C > Bed: Min A (steadying Pt. > 75%)  FIM - Locomotion: Wheelchair Distance: 50 Locomotion: Wheelchair: 2: Travels 50 - 149 ft with supervision, cueing or coaxing FIM - Locomotion: Ambulation Locomotion: Ambulation Assistive Devices: Administrator Ambulation/Gait Assistance: 3: Mod assist Locomotion: Ambulation: 2: Travels 50 - 149 ft with moderate assistance (Pt: 50 - 74%)  Comprehension Comprehension Mode: Auditory Comprehension: 4-Understands basic 75 - 89% of the time/requires cueing 10 - 24% of the time  Expression Expression Mode: Verbal Expression: 3-Expresses basic 50 - 74% of the time/requires cueing 25 - 50% of the time. Needs to repeat parts of sentences.  Social Interaction Social Interaction: 4-Interacts appropriately 75 - 89% of the time - Needs redirection for appropriate language or to initiate interaction.  Problem Solving Problem Solving: 2-Solves basic 25 - 49% of the time - needs direction more than half the time to initiate, plan or complete simple activities  Memory Memory: 2-Recognizes or recalls 25 - 49% of the time/requires cueing 51 - 75% of the time   Medical Problem List and Plan:  1. DVT Prophylaxis/Anticoagulation: Pharmaceutical: Xarelto  2. Pain Management: tylenol  3. Mood: monitor for depression, encourage interaction  4. Neuropsych: This patient is not capable of making decisions on his own behalf.  5. Afib Xarelto  6. Resp- recent Influenza and hx COPD- IS,monitor   LOS (Days) 7 A FACE TO FACE EVALUATION WAS PERFORMED  KIRSTEINS,ANDREW E 01/16/2014, 8:09 AM

## 2014-01-16 NOTE — Progress Notes (Signed)
Physical Therapy Session Note  Patient Details  Name: [name] MRN: [id number] Date of Birth: 1926/12/28  Today's Date: 01/16/2014 Time: 1122-1208 and 1950-9326 Time Calculation (min): 46 min and 42 min  Short Term Goals: Week 1:  PT Short Term Goal 1 (Week 1): Pt to perform supine<>sit with min A. PT Short Term Goal 2 (Week 1): Pt to perform bed<>chair transfer with min A using rolling walker. PT Short Term Goal 3 (Week 1): Pt will perform gait x75' in controlled environment with rolling walker and mod A. PT Short Term Goal 4 (Week 1): Pt will perform w/c mobility x50' in controlled environment with supervision.  Skilled Therapeutic Interventions/Progress Updates:    Treatment Session 1: Pt received seated on toilet in room with wife and son present. Pt performed stand-pivot from toilet>w/c with rolling walker, mod A of son. Self-propulsion of w/c x160' in controlled environment with bilat UE's, min A for grasping w/c hand rim, increased time. Gait x220' total (standing rest break x30 seconds after initial 110') in controlled environment with rolling walker and min guard for initial 180', min A during final 40' secondary to onset of R-sided trunk lean, limited RLE step length, decreased R foot clearance during due to fatigue. Pt required increased time for gait trial.   Son demonstrated safe and appropriate assistance with stand pivot transfer using rolling walker. Therapist modified safety plan and notified RN that son is safe to transfer pt within room with rolling walker. Session ended in room, where pt was left seated in w/c with son and wife present and all needs within reach.   Treatment Session 2: Pt received seated in w/c accompanied by son. Pt agreeable to session. Attempted to initiate w/c mobility; however, pt with increased difficulty gripping R hand rim with R hand. Therapist retrieved and applied Theraband to R w/c hand rim to improve pt grip, increase independence with w/c  mobility. Pt then performed self-propulsion of w/c x100' in controlled environment with increased time, min A for RUE position on hand rim. Transported pt remaining distance to gym, where pt performed negotiation of 6 stairs with L rail, sideways, step-to pattern, with min A and verbal cueing for foot placement. Transported pt to room in w/c secondary to pt fatigue. Stand pivot from w/c>bedside chair with rolling walker, min guard, and verbal cueing for transfer setup and body mechanics. Multiple sit<>stand transfers to/from w/c with supervision to min A (for anterior weight shift). Therapist departed with pt seated in bedside chair with wife and son present and all needs within reach.  Therapy Documentation Precautions:  Precautions Precautions: Fall Precaution Comments: Sensory deficits on R side; may push to R Restrictions Weight Bearing Restrictions: No Pain: Pain Assessment Pain Assessment: No/denies pain Pain Score: 0-No pain Locomotion : Ambulation Ambulation/Gait Assistance: 4: Min assist Wheelchair Mobility Distance: 160   See FIM for current functional status  Therapy/Group: Individual Therapy  Shainna Faux, Malva Cogan 01/16/2014, 12:20 PM

## 2014-01-16 NOTE — Progress Notes (Signed)
Speech Language Pathology Daily Session Note  Patient Details  Name: [name] MRN: [id number] Date of Birth: [birth year]  Today's Date: 01/16/2014 Time: 2952-8413 Time Calculation (min): 45 min  Short Term Goals: Week 1: SLP Short Term Goal 1 (Week 1): Pt will consume Dys 1 textures and honey thick liquids, utilizing safe swallowing strategies with Mod cues SLP Short Term Goal 2 (Week 1): Pt will consume therapeutic trials of nectar thick liquid with minimal overt s/s of aspiration to determine readiness for diet advancement SLP Short Term Goal 3 (Week 1): Pt will sustain attention to basic functional task for 60 seconds with Max cues SLP Short Term Goal 4 (Week 1): Pt will use environmental clues to demonstrate orientation x4 with Max cues SLP Short Term Goal 5 (Week 1): Pt will verbally communicate wants/needs during functional task with Max cues SLP Short Term Goal 6 (Week 1): Pt will name common objects with 80% accuracy with Mod cues  Skilled Therapeutic Interventions: Skilled treatment focused on cognitive-linguistic and swallowing goals. SLP facilitated session with skilled observation of advanced trials of nectar-thick liquids, with mild-moderate anterior spillage yet no overt s/s of aspiration observed. Recommend to advance pt to nectar thick liquids. Pt sustained attention to structured cognitive task for ~10 minutes with Min cues for sustained attention and accuracy while sorting cards into 4 categories. Pt required Mod cues to recall events of therapy session yesterday and to utilize a louder voice. Continue plan of care.   FIM:  Comprehension Comprehension Mode: Auditory Comprehension: 5-Understands basic 90% of the time/requires cueing < 10% of the time Expression Expression Mode: Verbal Expression: 3-Expresses basic 50 - 74% of the time/requires cueing 25 - 50% of the time. Needs to repeat parts of sentences. Social Interaction Social Interaction: 4-Interacts  appropriately 75 - 89% of the time - Needs redirection for appropriate language or to initiate interaction. Problem Solving Problem Solving: 2-Solves basic 25 - 49% of the time - needs direction more than half the time to initiate, plan or complete simple activities Memory Memory: 2-Recognizes or recalls 25 - 49% of the time/requires cueing 51 - 75% of the time FIM - Eating Eating Activity: 5: Needs verbal cues/supervision  Pain  No/denies pain  Therapy/Group: Individual Therapy   Germain Osgood, M.A. CCC-SLP [phone]   Germain Osgood 01/16/2014, 3:24 PM

## 2014-01-17 ENCOUNTER — Inpatient Hospital Stay (HOSPITAL_COMMUNITY): Payer: Medicare Other

## 2014-01-17 ENCOUNTER — Inpatient Hospital Stay (HOSPITAL_COMMUNITY): Payer: Medicare Other | Admitting: Physical Therapy

## 2014-01-17 ENCOUNTER — Inpatient Hospital Stay (HOSPITAL_COMMUNITY): Payer: Medicare Other | Admitting: Occupational Therapy

## 2014-01-17 NOTE — Progress Notes (Addendum)
Speech Language Pathology Daily Session Notes  Patient Details  Name: [name] MRN: [id number] Date of Birth: Mar 17, 1927  Today's Date: 01/17/2014 Time #1: 1287-8676 Time Calculation (min): 45 min  Time #2: 7209-4709 Time Calculation (min): 45 min  Short Term Goals: Week 1: SLP Short Term Goal 1 (Week 1): Pt will consume Dys 1 textures and honey thick liquids, utilizing safe swallowing strategies with Mod cues SLP Short Term Goal 2 (Week 1): Pt will consume therapeutic trials of nectar thick liquid with minimal overt s/s of aspiration to determine readiness for diet advancement SLP Short Term Goal 3 (Week 1): Pt will sustain attention to basic functional task for 60 seconds with Max cues SLP Short Term Goal 4 (Week 1): Pt will use environmental clues to demonstrate orientation x4 with Max cues SLP Short Term Goal 5 (Week 1): Pt will verbally communicate wants/needs during functional task with Max cues SLP Short Term Goal 6 (Week 1): Pt will name common objects with 80% accuracy with Mod cues  Skilled Therapeutic Interventions: Session #1: Skilled treatment focused on speech and cognitive goals. Upon SLP arrival, pt verbalized his need to use the bathroom with Mod I. SLP facilitated session with education regarding speech intelligibility strategies. Pt utilized strategies to increase intelligibility at the phrase level with Max cues. He read strategies aloud from paper with Min-Mod cues from SLP; pt reported difficulty due to visual deficits. Pt completed basic money counting task with Max cues for initiation and basic problem solving.    Session #2: Skilled treatment focused on swallowing, speech, and cognitive goals. SLP facilitated session with skilled observation of nectar thick liquids via cup sips. Pt consumed ~4 oz of fluids with wet vocal quality x1, which he self-managed with a reflexive throat clear. Pt recalled what he had eaten for breakfast with Mod cues from SLP/wife. He  demonstrated intellectual awareness of physical and cognitive changes with Mod cues. Pt required Max cues for speech intelligibility strategies with minimal increase in vocal intensity noted during this session.   FIM:  Comprehension Comprehension Mode: Auditory Comprehension: 4-Understands basic 75 - 89% of the time/requires cueing 10 - 24% of the time Expression Expression Mode: Verbal Expression: 3-Expresses basic 50 - 74% of the time/requires cueing 25 - 50% of the time. Needs to repeat parts of sentences. Social Interaction Social Interaction: 4-Interacts appropriately 75 - 89% of the time - Needs redirection for appropriate language or to initiate interaction. Problem Solving Problem Solving: 2-Solves basic 25 - 49% of the time - needs direction more than half the time to initiate, plan or complete simple activities Memory Memory: 2-Recognizes or recalls 25 - 49% of the time/requires cueing 51 - 75% of the time FIM - Eating Eating Activity: 5: Needs verbal cues/supervision;4: Helper checks for pocketed food  Pain  No/denies pain  Therapy/Group: Individual Therapy   Germain Osgood, M.A. CCC-SLP [phone]   Germain Osgood 01/17/2014, 3:53 PM

## 2014-01-17 NOTE — Progress Notes (Signed)
Speech Language Pathology Weekly Progress Note  Patient Details  Name: [name] MRN: [id number] Date of Birth: [birth year]  Today's Date: 01/17/2014  Short Term Goals: Week 1: SLP Short Term Goal 1 (Week 1): Pt will consume Dys 1 textures and honey thick liquids, utilizing safe swallowing strategies with Mod cues SLP Short Term Goal 1 - Progress (Week 1): Met SLP Short Term Goal 2 (Week 1): Pt will consume therapeutic trials of nectar thick liquid with minimal overt s/s of aspiration to determine readiness for diet advancement SLP Short Term Goal 2 - Progress (Week 1): Met SLP Short Term Goal 3 (Week 1): Pt will sustain attention to basic functional task for 60 seconds with Max cues SLP Short Term Goal 3 - Progress (Week 1): Met SLP Short Term Goal 4 (Week 1): Pt will use environmental clues to demonstrate orientation x4 with Max cues SLP Short Term Goal 4 - Progress (Week 1): Met SLP Short Term Goal 5 (Week 1): Pt will verbally communicate wants/needs during functional task with Max cues SLP Short Term Goal 5 - Progress (Week 1): Not met SLP Short Term Goal 6 (Week 1): Pt will name common objects with 80% accuracy with Mod cues SLP Short Term Goal 6 - Progress (Week 1): Met  New Short Term Goals: Week 2: SLP Short Term Goal 1 (Week 2): Pt will consume Dys 1 textures and nectar-thick liquids, utilizing safe swallowing strategies with Mod cues SLP Short Term Goal 2 (Week 2): Pt will demonstrate effective mastication and oral clearance with dys 2 textures to assess readiness for diet advancement SLP Short Term Goal 3 (Week 2): Pt will sustain attention to basic functional task for 10 min with Mod cues SLP Short Term Goal 5 (Week 2): Pt will name common objects with 80% accuracy with Supervision cues  Weekly Progress Updates: Pt has met 5 out of 6 STGs during this reporting period with improvements noted in sustained attention, confrontational naming, and swallowing function. He is  consuming Dys 1 textures and has been advanced to nectar thick liquids. He is consuming therapeutic trials of Dys 2 textures to assess readiness for advancement. Pt requires Max cues for speech intelligibility strategies. He requires Mod-Max cues for sustained attention, intellectual awareness, basic problem solving, and initiation of tasks. Education is ongoing with patient and wife. Pt will benefit from continued SLP services to maximize swallowing safety, cognitive function, and functional communication.   SLP Intensity: Minumum of 1-2 x/day, 30 to 90 minutes SLP Frequency: 5 out of 7 days SLP Duration/Estimated Length of Stay: 21-25 days SLP Treatment/Interventions: Cognitive remediation/compensation;Cueing hierarchy;Dysphagia/aspiration precaution training;Environmental controls;Functional tasks;Internal/external aids;Patient/family education;Speech/Language facilitation;Oral motor exercises     Germain Osgood, M.A. CCC-SLP [phone]   Germain Osgood 01/17/2014, 4:26 PM

## 2014-01-17 NOTE — Progress Notes (Signed)
Physical Therapy Weekly Progress Note  Patient Details  Name: [name] MRN: [id number] Date of Birth: 1927-08-10  Today's Date: 01/17/2014 Time: 1025-8527 Time Calculation (min): 60 min  Patient has met 3 of 4 short term goals.  Patient has demonstrated increased stability/independence with bed mobility, functional transfers, and gait stability. Pt continues to require min A with w/c mobility secondary to difficulty using RUE on w/c hand rim.  Patient continues to demonstrate the following deficits: R hemiparesis, impaired coordination, postural/gait instability, endurance, and therefore will continue to benefit from skilled PT intervention to enhance overall performance with activity tolerance, balance, postural control, ability to compensate for deficits, functional use of  right upper extremity and right lower extremity, attention, awareness and coordination.  Patient progressing toward long term goals..  Continue plan of care.  Patient progressing more quickly than originally expected. The following long term goals were therefore upgraded: dynamic sitting balance, bed mobility, car transfers, stair negotiation, and gait distance in controlled/community environments.  PT Short Term Goals Week 1:  PT Short Term Goal 1 (Week 1): Pt to perform supine<>sit with min A. PT Short Term Goal 1 - Progress (Week 1): Met PT Short Term Goal 2 (Week 1): Pt to perform bed<>chair transfer with min A using rolling walker. PT Short Term Goal 2 - Progress (Week 1): Met PT Short Term Goal 3 (Week 1): Pt will perform gait x75' in controlled environment with rolling walker and mod A. PT Short Term Goal 3 - Progress (Week 1): Met PT Short Term Goal 4 (Week 1): Pt will perform w/c mobility x50' in controlled environment with supervision. PT Short Term Goal 4 - Progress (Week 1): Progressing toward goal Week 2:  PT Short Term Goal 1 (Week 2): Patient will perform sit>supine independently with HOB flat and no  rails. PT Short Term Goal 2 (Week 2): Patient will performed bed<>w/c transfer with rolling walker and supervision. PT Short Term Goal 3 (Week 2): Patient will perform gait x75' in controlled environment with rolling walker and supervision. PT Short Term Goal 4 (Week 2): Patient will perform w/c mobility x75' in controlled environment with supervision.  Skilled Therapeutic Interventions/Progress Updates:    Pt received seated in w/c with wife present; agreeable to session. Stand pivot from w/c>toilet with mod A, rolling walker, grab bars; from toilet>w/c with min A, rolling walker, grab bars. Pt performed gait x119' in controlled environment with min A, manual facilitation of L weight shifting prior to RLE advancement. With turning during gait, verbal cues for widened BOS, manual facilitation of L weight shift secondary to pt tendency of LE scissoring, trunk lean to R side. Sit>supine with supervision, HOB flat, no rails, and increased time. Independent with supine>sit. See below for detailed description of NMR. Returned to room via gait x140' in controlled environment with rolling walker, min A, manual facilitation and cueing as described above. Session ended in pt room, where pt was left seated in w/c with wife present and all needs within reach.  Therapy Documentation Precautions:  Precautions Precautions: Fall Precaution Comments: Sensory deficits on R side; may push to R Restrictions Weight Bearing Restrictions: No Pain: Pain Assessment Pain Assessment: 0-10 Pain Score: 4  Pain Type: Acute pain Pain Location: Knee Pain Orientation: Right Pain Descriptors / Indicators: Aching;Other (Comment) ("stiffness" per pt) Pain Onset: Gradual Pain Intervention(s): Ambulation/increased activity;Other (Comment) (Pt declines pain medication; wife to apply Voltaren gel post-session) Multiple Pain Sites: No Locomotion : Ambulation Ambulation/Gait Assistance: 4: Min assist  Balance:  Supine: RLE D2  flexion/extension x5 reps rhythmic initiation, x12 reps resisted. Verbal cues focused on R dorsiflexion with concurrent hip/knee flexion. Pt able to co-activate R dorsiflexors and R hip/knee flexors; however, pt with difficulty performing hip abduction/adduction and internal/external rotation components of RLE flexion/extension patterns. Effective carryover of R ankle dorsiflexion noted during RLE swing phase during second gait trial.  See FIM for current functional status  Therapy/Group: Individual Therapy  [name], Malva Cogan 01/17/2014, 11:52 AM

## 2014-01-17 NOTE — Progress Notes (Addendum)
Occupational Therapy Session Note  Patient Details  Name: [name] MRN: [id number] Date of Birth: Apr 29, 1927  Today's Date: 01/17/2014 Time: 0830-0930 Time Calculation (min): 60 min  Short Term Goals: Week 1:  OT Short Term Goal 1 (Week 1): Patient will complete LB dressing with mod assist and mod cues OT Short Term Goal 2 (Week 1): Patient will complete bathing with mod assist and mod cueing OT Short Term Goal 3 (Week 1): Patient will complete toilet transfer with min assist OT Short Term Goal 4 (Week 1): Patient will complete UB dressing with min assist OT Short Term Goal 5 (Week 1): Pt demonstrate improved postural control in sitting at midline with min assist for approx 1 min   Skilled Therapeutic Interventions/Progress Updates:  No complaints of pain (addendum) Upon entering room, patient found seated edge of bed brushing teeth with wife present. Patient stood with RW and ambulated into bathroom for toilet transfer & toileting tasks. From here, patient ambulated and transferred onto tub transfer bench with moderate assistance. UB/LB bathing completed with minimal assistance (patient required assistance with peri cleansing). Patient donned 2 shirts while seated in shower and then ambulated out of shower > w/c set-up in room. LB dressing completed in sit<>stand position. Patient with good initiation during tasks and was audibly understandable during most conversations. At end of session, left patient seated in w/c with wife present.   Precautions:  Precautions Precautions: Fall Precaution Comments: Sensory deficits on R side; may push to R Restrictions Weight Bearing Restrictions: No  See FIM for current functional status  Therapy/Group: Individual Therapy  Maram Bently 01/17/2014, 11:53 AM

## 2014-01-17 NOTE — Progress Notes (Signed)
Xarelto per pharmacy  Anticoagulation: Xarelto resumed for Afib, had small amt of hematuria without clots 1/13 (similar to PTA, residual hematuria from bladder cancer s/p surgery per MD documentation). Hx CVA, taken off of Xarelto or dose reduced (per RN), then had another CVA. His renal function is borderline for Xarelto 20 mg daily, but per record his PTA dose was 15mg  daily. Dr. Mare Ferrari had pt on lower dose due to hematuria. Per Dr. Rosezella [place] note (1/14), the failure of this med could be because it was held for a short while for hematuria, and he recommenced to continue 15mg  daily for now. Urine output good.  1- Xarelto 15mg  daily 2- Next note 1/25

## 2014-01-17 NOTE — Progress Notes (Signed)
80 y.o. right handed male with history of COPD, prior CVA 08/2012 with right sided weakness and dysphagia ( CIR stay) , A fib--Xarelto and bladder cancer with ongoing treatment at [facility]. He was admitted 12/25/2013 with altered mental status and low grade fever of 100 Fahrenheit and reported hematuria. Cranial CT scan negative and MRI of the brain showed punctate acute infarct in the high left parietal lobe as well as extensive chronic ischemic changes. Patient's Xarelto held for a short time secondary to hematuria. He was started on IV rocephin due to SIRS as well as Tamiflu as he tested positive for type A influenza. He was placed on Dysphagia 1 nectar thick liquid diet per ST recommendations. Patient with difficulty with processing with easy distractibility, as well as problems with initiation. Needs tactile cues for basic tasks and mobility. Mentation improving and neurology recommends resuming Xarelto for embolic stroke due to A Fib  On 01/05/14, he was completing his shower when he developed acute onset of right sided weakness with speech difficulty. Code stroke was initiated and he was transferred to acute services. MRI 01/06/2014 revealed Left PCA distribution infarct  Subjective/Complaints: Fluids upgraded no cough or SOB No SOB or cough Review of Systems - difficult to obtain secondary to aphasia and reduced attn Objective: Vital Signs: Blood pressure 114/74, pulse 111, temperature 97.8 F (36.6 C), temperature source Oral, resp. rate 17, height 6' 2.02" (1.88 m), weight 56.8 kg (125 lb 3.5 oz), SpO2 96.00%. No results found. No results found for this or any previous visit (from the past 72 hour(s)).    Constitutional: He appears well-developed and well-nourished.  HENT:  Head: Normocephalic and atraumatic.  Eyes: Conjunctivae and EOM are normal. Pupils are equal, round, and reactive to light.  Neck: Normal range of motion. Neck supple.  Cardiovascular: Normal rate and normal heart sounds.  An irregularly irregular rhythm present.  Respiratory: Effort normal. No respiratory distress. He has decreased breath sounds in the right lower field and the left lower field.  Neurological: He is alert. He exhibits normal muscle tone.  Motor strength is 3 minus right deltoid, bicep, tricep, grip, right ankle dorsiflexor 4 minus right hip flexor knee extensor 5/5 left deltoid, bicep, tricep, grip, hip flexors, knee extensors, ankle dorsiflexor plantar flexor   Assessment/Plan: 1. Functional deficits secondary to L PCA embolic infarct 2/99/2426 superimposed on L high parietal infarct onset 12/25/2013 which require 3+ hours per day of interdisciplinary therapy in a comprehensive inpatient rehab setting. Physiatrist is providing close team supervision and 24 hour management of active medical problems listed below. Physiatrist and rehab team continue to assess barriers to discharge/monitor patient progress toward functional and medical goals. FIM: FIM - Bathing Bathing Steps Patient Completed: Chest;Right Arm;Abdomen;Right upper leg;Left upper leg;Left Arm;Front perineal area Bathing: 3: Mod-Patient completes 5-7 55f 10 parts or 50-74%  FIM - Upper Body Dressing/Undressing Upper body dressing/undressing steps patient completed: Thread/unthread left sleeve of pullover shirt/dress;Put head through opening of pull over shirt/dress;Thread/unthread right sleeve of pullover shirt/dresss (donned 2 shirts) Upper body dressing/undressing: 3: Mod-Patient completed 50-74% of tasks FIM - Lower Body Dressing/Undressing Lower body dressing/undressing steps patient completed: Thread/unthread right underwear leg;Thread/unthread right pants leg;Thread/unthread left pants leg;Don/Doff right sock;Pull pants up/down Lower body dressing/undressing: 3: Mod-Patient completed 50-74% of tasks  FIM - Toileting Toileting steps completed by patient: Adjust clothing prior to toileting Toileting Assistive Devices: Grab bar  or rail for support Toileting: 2: Max-Patient completed 1 of 3 steps  FIM - Media planner  Assistive Devices: Grab bars;Walker Toilet Transfers: 3-From toilet/BSC: Mod A (lift or lower assist)  FIM - Control and instrumentation engineer Devices: Environmental consultant;Arm rests Bed/Chair Transfer: 4: Bed > Chair or W/C: Min A (steadying Pt. > 75%);4: Chair or W/C > Bed: Min A (steadying Pt. > 75%)  FIM - Locomotion: Wheelchair Distance: 100 Locomotion: Wheelchair: 2: Travels 50 - 149 ft with minimal assistance (Pt.>75%) FIM - Locomotion: Ambulation Locomotion: Ambulation Assistive Devices: Administrator Ambulation/Gait Assistance: 4: Min assist Locomotion: Ambulation: 4: Travels 150 ft or more with minimal assistance (Pt.>75%)  Comprehension Comprehension Mode: Auditory Comprehension: 5-Understands basic 90% of the time/requires cueing < 10% of the time  Expression Expression Mode: Verbal Expression: 3-Expresses basic 50 - 74% of the time/requires cueing 25 - 50% of the time. Needs to repeat parts of sentences.  Social Interaction Social Interaction: 4-Interacts appropriately 75 - 89% of the time - Needs redirection for appropriate language or to initiate interaction.  Problem Solving Problem Solving: 2-Solves basic 25 - 49% of the time - needs direction more than half the time to initiate, plan or complete simple activities  Memory Memory: 2-Recognizes or recalls 25 - 49% of the time/requires cueing 51 - 75% of the time   Medical Problem List and Plan:  1. DVT Prophylaxis/Anticoagulation: Pharmaceutical: Xarelto  2. Pain Management: tylenol  3. Mood: monitor for depression, encourage interaction  4. Neuropsych: This patient is not capable of making decisions on his own behalf.  5. Afib Xarelto  6. Resp- recent Influenza and hx COPD- IS,monitor   LOS (Days) 8 A FACE TO FACE EVALUATION WAS PERFORMED  KIRSTEINS,ANDREW E 01/17/2014, 6:17 AM

## 2014-01-18 ENCOUNTER — Inpatient Hospital Stay (HOSPITAL_COMMUNITY): Payer: Medicare Other

## 2014-01-18 DIAGNOSIS — I69920 Aphasia following unspecified cerebrovascular disease: Secondary | ICD-10-CM

## 2014-01-18 DIAGNOSIS — I6340 Cerebral infarction due to embolism of unspecified cerebral artery: Secondary | ICD-10-CM

## 2014-01-18 DIAGNOSIS — G8110 Spastic hemiplegia affecting unspecified side: Secondary | ICD-10-CM

## 2014-01-18 DIAGNOSIS — R5381 Other malaise: Secondary | ICD-10-CM

## 2014-01-18 LAB — BASIC METABOLIC PANEL
BUN: 23 mg/dL (ref 6–23)
CALCIUM: 8.8 mg/dL (ref 8.4–10.5)
CHLORIDE: 105 meq/L (ref 96–112)
CO2: 26 meq/L (ref 19–32)
Creatinine, Ser: 0.97 mg/dL (ref 0.50–1.35)
GFR calc Af Amer: 85 mL/min — ABNORMAL LOW (ref >90)
GFR calc non Af Amer: 74 mL/min — ABNORMAL LOW (ref >90)
GLUCOSE: 86 mg/dL (ref 70–99)
Potassium: 4.6 mEq/L (ref 3.7–5.3)
SODIUM: 142 meq/L (ref 137–147)

## 2014-01-18 LAB — CBC
HEMATOCRIT: 30.7 % — AB (ref 39.0–52.0)
Hemoglobin: 10.3 g/dL — ABNORMAL LOW (ref 13.0–17.0)
MCH: 32.5 pg (ref 26.0–34.0)
MCHC: 33.6 g/dL (ref 30.0–36.0)
MCV: 96.8 fL (ref 78.0–100.0)
PLATELETS: 253 10*3/uL (ref 150–400)
RBC: 3.17 MIL/uL — AB (ref 4.22–5.81)
RDW: 14.9 % (ref 11.5–15.5)
WBC: 4.4 10*3/uL (ref 4.0–10.5)

## 2014-01-18 NOTE — Patient Care Conference (Signed)
Inpatient RehabilitationTeam Conference and Plan of Care Update Date: 01/16/2014   Time: 10:45 AM    Patient Name: [name]      Medical Record Number: [id number]  Date of Birth: July 13, 1927 Sex: Male         Room/Bed: 4W08C/4W08C-01 Payor Info: Payor: [employer] MEDICARE / Plan: AARP MEDICARE COMPLETE / Product Type: *No Product type* /    Admitting Diagnosis: Deconditioned  Admit Date/Time:  01/09/2014  6:47 PM Admission Comments: No comment available   Primary Diagnosis:  CVA (cerebral infarction) Principal Problem: CVA (cerebral infarction)  Patient Active Problem List   Diagnosis Date Noted  . Acute CVA (cerebrovascular accident) 01/07/2014  . Altered mental status 01/05/2014  . Influenza with other respiratory manifestations 01/01/2014  . Prediabetes 01/01/2014  . CVA (cerebral infarction) 01/01/2014  . Atrial fibrillation 12/25/2013  . Hypothermia 03/23/2013  . Constipation 10/16/2012  . Preventative health care 09/17/2012  . Nocturia 09/17/2012  . UTI (lower urinary tract infection) 08/22/2012  . Leukocytosis 08/22/2012  . Pseudogout 08/22/2012  . Encephalopathy 08/21/2012  . Left knee pain 08/21/2012  . Prerenal azotemia 08/21/2012  . CVA (cerebral vascular accident)-Acute right small nonhemorrhagic infarct seen on MRI 08/17/2012  . Lacunar stroke 04/07/2011  . Pleural effusion 04/07/2011  . 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 Long term current use of anticoagulant 04/07/2011  . NEOPLASM, MALIGNANT, BLADDER 04/10/2009  . HYPERLIPIDEMIA 04/10/2009  . ANEMIA-NOS 04/10/2009  . 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 ANXIETY 04/10/2009  . HYPERTENSION 04/10/2009  . COPD 04/10/2009  . TRANSIENT ISCHEMIC ATTACK, HX OF 04/10/2009    Expected Discharge Date: Expected Discharge Date: 01/31/14  Team Members Present: Physician leading conference: Dr. Alysia Penna Social Worker Present: Alfonse Alpers, LCSW Nurse Present: Other (comment) Rayetta Pigg, RN) PT Present: Georjean Mode, PT;Other (comment) Benjie Karvonen [name], PT) OT Present: Gareth Morgan, Lorelee Cover, OT SLP Present: Germain Osgood, SLP PPS Coordinator present : Daiva Nakayama, RN, CRRN     Current Status/Progress Goal Weekly Team Focus  Medical   Knee pain improved, right-sided weakness and aphasia, poor fluid intake  Improve cognitive status, improve speech  Encourage per os hydration, discontinued IV fluids   Bowel/Bladder   Continent of bowel. LBM 01/15/13. Continent of bladder. Brief during the day. Condom cath @[facility] .   Managed of bowel and bladder  Monitor for effectiveness of bladder regiment   Swallow/Nutrition/ Hydration   Dys 1 textures and honey thick liquids, trials of nectar thick liquids and Dys 2 textures  supervision with least restrictive PO  trials of nectar thick liquids and Dys 2 textures to assess readiness for advancement   ADL's   mod assist all self-care tasks and functional transfers, apraxia in RUE  supervision overall and min assist bathing  NMR, cognition, strengthening, functional transfers, activity tolerance, standing balance, family eduation    Mobility   Mod A overall  Supervision overall  Static/dynamic standing balance, endurance, gait/stair training   Communication   Mod-Max cues for speech intelligibility, Min cues for confrontational naming  Min  increase use of intelligibility strategies and word-finding strategies   Safety/Cognition/ Behavioral Observations  Max A for basic problem solving, oriented to time with Mod I using calendar  Min  sustained attention, basic problem solving, awareness   Pain   No c/o pain  <3  Offer pain medication 1hr prior to initial therapy session   Skin   Scrotum pink. No visible rash present  No additional skin breakdown  Assess q shift  Rehab Goals Patient on target to meet rehab goals: Yes Rehab Goals Revised: Pt returned to rehab and goals have been set for supervision with some minimal  assistance. *See Care Plan and progress notes for long and short-term goals.  Barriers to Discharge: New CVA on top of subacute CVA    Possible Resolutions to Barriers:  See above    Discharge Planning/Teaching Needs:  Wife plans to provide pt with 24/7 supervision at discharge and can provide minimal assistance required.  Wife can participate in family education, as needed.  She is here with pt daily.   Team Discussion:  Pt's diet has been advanced to nectar thick liquids from honey thick.  Pt with some cognitive impairment and difficulty with word finding, poor initiation, and low attention.  Pt's goals are set for supervision overall.  Pt is doing well with therapies.  Revisions to Treatment Plan:  None   Continued Need for Acute Rehabilitation Level of Care: The patient requires daily medical management by a physician with specialized training in physical medicine and rehabilitation for the following conditions: Daily direction of a multidisciplinary physical rehabilitation program to ensure safe treatment while eliciting the highest outcome that is of practical value to the patient.: Yes Daily medical management of patient stability for increased activity during participation in an intensive rehabilitation regime.: Yes Daily analysis of laboratory values and/or radiology reports with any subsequent need for medication adjustment of medical intervention for : Neurological problems;Other  Mazzie Brodrick, [name] 01/18/2014, 1:25 PM

## 2014-01-18 NOTE — Progress Notes (Signed)
Speech Language Pathology Daily Session Notes  Patient Details  Name: [name] MRN: [id number] Date of Birth: 1927-02-01  Today's Date: 01/18/2014 Time #1: 1130-1225 Time Calculation (min): 55 min  Time #2: 1455-1540 Time Calculation (min): 45 min  Short Term Goals: Week 2: SLP Short Term Goal 1 (Week 2): Pt will consume Dys 1 textures and nectar-thick liquids, utilizing safe swallowing strategies with Mod cues SLP Short Term Goal 2 (Week 2): Pt will demonstrate effective mastication and oral clearance with dys 2 textures to assess readiness for diet advancement SLP Short Term Goal 3 (Week 2): Pt will sustain attention to basic functional task for 10 min with Mod cues SLP Short Term Goal 5 (Week 2): Pt will name common objects with 80% accuracy with Supervision cues  Skilled Therapeutic Interventions: Session #1: Treatment focused on speech, swallowing, and cognitive goals. SLP facilitated session with skilled observation of lunch meal consisting of Dys 1 textures and nectar thick liquids as well as advanced trials of small bites of DYs 3 textures. Pt exhibited intermittent wet vocal quality that he self-managed with reflexive throat clear to return to baseline. SLP suspects strong enough to clear possible penetrates. Pt demonstrated adequate although mildly prolonged mastication of advanced solids, utilizing a liquid wash for oral clearance with Mod I. Pt with increased initiation throughout session with self-care tasks such as self-feeding, wiping his mouth, etc. Pt participated in conversation throughout meal with mildly increased vocal intensity, which mildly improved speech intelligibility. Pt exhibited emergent awareness of Min word-finding difficulties at the conversational level, requiring Max cues to correct.     Session #2: Skilled treatment focused on speech and cognitive-linguistic goals. Upon SLP arrival, pt verbalized his need to use the bathroom with soft but adequate vocal  intensity. Pt followed commands for transfer with Mod cues for initiation, and verbalized his wants/needs throughout toileting with Mod-Max cues for speech intelligibility strategies. SLP facilitated session with structured new learning task. Pt sustained attention to task with Mod cues for ~15 minutes, and required Mod cues for initiation. He demonstrated adequate basic problem solving with Mod cues. Continue plan of care.  FIM:  Comprehension Comprehension Mode: Auditory Comprehension: 5-Understands basic 90% of the time/requires cueing < 10% of the time Expression Expression Mode: Verbal Expression: 4-Expresses basic 75 - 89% of the time/requires cueing 10 - 24% of the time. Needs helper to occlude trach/needs to repeat words. Social Interaction Social Interaction: 4-Interacts appropriately 75 - 89% of the time - Needs redirection for appropriate language or to initiate interaction. Problem Solving Problem Solving: 2-Solves basic 25 - 49% of the time - needs direction more than half the time to initiate, plan or complete simple activities Memory Memory: 2-Recognizes or recalls 25 - 49% of the time/requires cueing 51 - 75% of the time FIM - Eating Eating Activity: 5: Needs verbal cues/supervision;4: Helper checks for pocketed food;4: Help with picking up utensils  Pain Pain Assessment Pain Assessment: No/denies pain  Therapy/Group: Individual Therapy   Germain Osgood, M.A. CCC-SLP [phone]   Germain Osgood 01/18/2014, 1:25 PM

## 2014-01-18 NOTE — Progress Notes (Signed)
NUTRITION FOLLOW UP  DOCUMENTATION CODES  Per approved criteria   -Severe malnutrition in the context of chronic illness  -Underweight    Intervention:   1.  Supplements; decreased Breeze to once daily.  Add Ensure Pudding po BID, each supplement provides 170 kcal and 4 grams of protein  Nutrition Dx:   Increased nutrient needs, ongoing  Monitor:   1.  Food/Beverage; pt meeting >/=90% estimated needs with tolerance. 2.  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 He was admitted 12/25/2013 with altered mental status and low grade fever of 100 Fahrenheit and reported hematuria. Cranial CT scan negative and MRI of the brain showed punctate acute infarct in the high left parietal lobe as well as extensive chronic ischemic changes. He was placed on Dysphagia 1 nectar thick liquid diet per ST recommendations. Patient with difficulty with processing with easy distractibility, as well as problems with initiation. Needs tactile cues for basic tasks and mobility. On 01/05/14, he was completing his shower when he developed acute onset of right sided weakness with speech difficulty. Code stroke was initiated and he was transferred to acute services. MRI 01/06/2014 revealed Left PCA distribution infarct.  Pt's intake has improved to 75-100% of meals consistently. He has been taking Breeze once daily.  He continues to get [employer] on tray.   Height: Ht Readings from Last 1 Encounters:  01/09/14 6' 2.02" (1.88 m)    Weight Status:   Wt Readings from Last 1 Encounters:  01/09/14 125 lb 3.5 oz (56.8 kg)    Re-estimated needs:  Kcal: 2000-2400  Protein: 115-130g/day  Fluid: 2-2.4L/day  Skin: intact  Diet Order: Dysphagia 1, nectar-thick   Intake/Output Summary (Last 24 hours) at 01/18/14 1601 Last data filed at 01/18/14 1300  Gross per 24 hour   Intake    720 ml  Output    550 ml  Net    170 ml    Last BM: 1/22   Labs:   Recent Labs Lab 01/18/14 0557  NA 142  K 4.6  CL 105  CO2 26  BUN 23  CREATININE 0.97  CALCIUM 8.8  GLUCOSE 86    CBG (last 3)  No results found for this basename: GLUCAP,  in the last 72 hours  Scheduled Meds: . antiseptic oral rinse  15 mL Mouth Rinse q12n4p  . chlorhexidine  15 mL Mouth Rinse BID  . diltiazem  60 mg Oral Q8H  . feeding supplement (RESOURCE BREEZE)  1 Container Oral TID BM  . rivaroxaban  15 mg Oral Q supper  . senna-docusate  2 tablet Oral BID    Continuous Infusions:   Brynda Greathouse, MS RD LDN Clinical Inpatient Dietitian Pager: [phone] Weekend/After hours pager: [phone]

## 2014-01-18 NOTE — Progress Notes (Signed)
Physical Therapy Session Note  Patient Details  Name: [name] MRN: [id number] Date of Birth: 09-05-1927  Today's Date: 01/18/2014 Time: 1000-1040 Time Calculation (min): 40 min  Short Term Goals: Week 2:  PT Short Term Goal 1 (Week 2): Patient will perform sit>supine independently with HOB flat and no rails. PT Short Term Goal 2 (Week 2): Patient will performed bed<>w/c transfer with rolling walker and supervision. PT Short Term Goal 3 (Week 2): Patient will perform gait x75' in controlled environment with rolling walker and supervision. PT Short Term Goal 4 (Week 2): Patient will perform w/c mobility x75' in controlled environment with supervision.  Skilled Therapeutic Interventions/Progress Updates:    Pt reports minimal sleep last night and therefore tired this AM but willing to participate in therapy. Focused session on transfer training with RW (cues for correct hand placement needed and advancing RLE during step part of transfer), side stepping along EOB to simulate home environment mobility, supine <-> sit and rolling on mat mod I to simulate flat bed at home without rails, gait training with RW with min A overall and cues for advancing RLE and for posture, and w/c propulsion for overall endurance and strengthening of UE's with S. Excellent participation this session and wife present as well and pleased with pt's overall progress.   Therapy Documentation Precautions:  Precautions Precautions: Fall Precaution Comments: Sensory deficits on R side; may push to R Restrictions Weight Bearing Restrictions: No  Pain: Pain Assessment Pain Assessment: No/denies pain Pain Score: 0-No pain  See FIM for current functional status  Therapy/Group: Individual Therapy  Canary Brim [facility] 01/18/2014, 11:45 AM

## 2014-01-18 NOTE — Progress Notes (Signed)
Occupational Therapy Weekly Progress Note  Patient Details  Name: [name] MRN: [id number] Date of Birth: Jan 28, 1927  Today's Date: 01/18/2014 Time: 0830-0930 Time Calculation (min): 60 min  Patient has met 4 of 5 short term goals.  Patient has been progressing well during his rehab stay and was initially limited by fatigue, cognition, strength, and coordination. Patient's cognition and fatigue have improved greatly. Patient is progressing to min assist for functional transfers and improvement in postural control while in standing. Patient is a Scientist, research (physical sciences) and consistently working to use his RUE functionally.   Patient continues to demonstrate the following deficits: R hemiparesis, impaired coordination, impaired postural control in standing, decreased strength, decreased endurance, and decreased awareness and therefore will continue to benefit from skilled OT intervention to enhance overall performance with BADL and functional use of RUE/RLE, awareness, coordination, safety, ability to compensate for deficits, postural control, and standing balance.  Patient progressing toward long term goals..  Continue plan of care.  OT Short Term Goals Week 1:  OT Short Term Goal 1 (Week 1): Patient will complete LB dressing with mod assist and mod cues OT Short Term Goal 1 - Progress (Week 1): Met OT Short Term Goal 2 (Week 1): Patient will complete bathing with mod assist and mod cueing OT Short Term Goal 2 - Progress (Week 1): Met OT Short Term Goal 3 (Week 1): Patient will complete toilet transfer with min assist OT Short Term Goal 3 - Progress (Week 1): Not met OT Short Term Goal 4 (Week 1): Patient will complete UB dressing with min assist OT Short Term Goal 4 - Progress (Week 1): Met OT Short Term Goal 5 (Week 1): Pt demonstrate improved postural control in sitting at midline with min assist for approx 1 min  OT Short Term Goal 5 - Progress (Week 1): Met Week 2:  OT Short Term Goal 1 (Week 2):  Patient will complete toilet transfer with min assist OT Short Term Goal 2 (Week 2): Patient will complete toilet task with steadying assist only OT Short Term Goal 3 (Week 2): Patient will complete oral care using RUE without assist 75% of task OT Short Term Goal 4 (Week 2): Patient will complete LB dressing with steadying assist  Skilled Therapeutic Interventions/Progress Updates:    Pt seen for ADL retraining with focus on functional transfers, postural control in standing, and functional use of RUE. Pt received sitting EOB with wife present. Ambulated to bathroom using RW with min assist and cues for keeping walker close to him. Pt with improved postural control during ambulation. Pt required mod assist to come up to standing from toilet and mod assist for turning for walk-in shower transfer. Pt utilized RUE for 30% of bathing, dropping wash cloth on one occasions. Completed dressing while sitting in w/c with [facility] assist provided for use of RUE to assist in managing clothing around waist. Completed oral care from w/c level using large tubing for sustained grip and HOH assist from therapist 75% of task for control. At end of session pt left sitting in w/c with all needs in reach and wife present.   Therapy Documentation Precautions:  Precautions Precautions: Fall Precaution Comments: Sensory deficits on R side; may push to R Restrictions Weight Bearing Restrictions: No General:   Vital Signs:   Pain: No report of pain during therapy session.   See FIM for current functional status  Therapy/Group: Individual Therapy  Duayne Cal 01/18/2014, 10:01 AM

## 2014-01-18 NOTE — Progress Notes (Signed)
80 y.o. 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 He was admitted 12/25/2013 with altered mental status and low grade fever of 100 Fahrenheit and reported hematuria. Cranial CT scan negative and MRI of the brain showed punctate acute infarct in the high left parietal lobe as well as extensive chronic ischemic changes. Patient's Xarelto held for a short time secondary to hematuria. He was started on IV rocephin due to SIRS as well as Tamiflu as he tested positive for type A influenza. He was placed on Dysphagia 1 nectar thick liquid diet per ST recommendations. Patient with difficulty with processing with easy distractibility, as well as problems with initiation. Needs tactile cues for basic tasks and mobility. Mentation improving and neurology recommends resuming Xarelto for embolic stroke due to A Fib  On 01/05/14, he was completing his shower when he developed acute onset of right sided weakness with speech difficulty. Code stroke was initiated and he was transferred to acute services. MRI 01/06/2014 revealed Left PCA distribution infarct  Subjective/Complaints: Fluids upgraded Nectar no cough or SOB Po intake improved, fluids and calories Review of Systems - difficult to obtain secondary to aphasia and reduced attn Objective: Vital Signs: Blood pressure 121/83, pulse 103, temperature 98 F (36.7 C), temperature source Oral, resp. rate 18, height 6' 2.02" (1.88 m), weight 56.8 kg (125 lb 3.5 oz), SpO2 99.00%. No results found. Results for orders placed during the hospital encounter of 01/09/14 (from the past 72 hour(s))  CBC     Status: Abnormal   Collection Time    01/18/14  5:57 AM      Result Value Range   WBC 4.4  4.0 - 10.5 K/uL   RBC 3.17 (*) 4.22 - 5.81 MIL/uL   Hemoglobin 10.3 (*) 13.0 - 17.0 g/dL   HCT 30.7 (*) 39.0 - 52.0 %   MCV 96.8  78.0 - 100.0 fL    MCH 32.5  26.0 - 34.0 pg   MCHC 33.6  30.0 - 36.0 g/dL   RDW 14.9  11.5 - 15.5 %   Platelets 253  150 - 400 K/uL      Constitutional: He appears well-developed and well-nourished.  HENT:  Head: Normocephalic and atraumatic.  Eyes: Conjunctivae and EOM are normal. Pupils are equal, round, and reactive to light.  Neck: Normal range of motion. Neck supple.  Cardiovascular: Normal rate and normal heart sounds. An irregularly irregular rhythm present.  Respiratory: Effort normal. No respiratory distress. He has decreased breath sounds in the right lower field and the left lower field.  Neurological: He is alert. He exhibits normal muscle tone.  Motor strength is 3 minus right deltoid, bicep, tricep, grip, right ankle dorsiflexor 4 minus right hip flexor knee extensor 5/5 left deltoid, bicep, tricep, grip, hip flexors, knee extensors, ankle dorsiflexor plantar flexor   Assessment/Plan: 1. Functional deficits secondary to L PCA embolic infarct 03/28/271 superimposed on L high parietal infarct onset 12/25/2013 which require 3+ hours per day of interdisciplinary therapy in a comprehensive inpatient rehab setting. Physiatrist is providing close team supervision and 24 hour management of active medical problems listed below. Physiatrist and rehab team continue to assess barriers to discharge/monitor patient progress toward functional and medical goals. FIM: FIM - Bathing Bathing Steps Patient Completed: Chest;Right Arm;Abdomen;Right upper leg;Left upper leg;Left Arm;Front perineal area;Right lower leg (including foot);Left lower leg (including foot) Bathing: 4: Min-Patient  completes 8-9 14f 10 parts or 75+ percent  FIM - Upper Body Dressing/Undressing Upper body dressing/undressing steps patient completed: Thread/unthread right sleeve of pullover shirt/dresss;Thread/unthread left sleeve of pullover shirt/dress;Put head through opening of pull over shirt/dress Upper body dressing/undressing: 4:  Min-Patient completed 75 plus % of tasks FIM - Lower Body Dressing/Undressing Lower body dressing/undressing steps patient completed: Thread/unthread right underwear leg;Thread/unthread left underwear leg;Thread/unthread right pants leg;Thread/unthread left pants leg;Don/Doff right sock;Don/Doff left sock Lower body dressing/undressing: 4: Min-Patient completed 75 plus % of tasks  FIM - Toileting Toileting steps completed by patient: Adjust clothing prior to toileting Toileting Assistive Devices: Grab bar or rail for support Toileting: 2: Max-Patient completed 1 of 3 steps  FIM - Radio producer Devices: Elevated toilet seat;Grab bars;Walker Toilet Transfers: 3-To toilet/BSC: Mod A (lift or lower assist);3-From toilet/BSC: Mod A (lift or lower assist)  FIM - Control and instrumentation engineer Devices: Walker;Arm rests Bed/Chair Transfer: 4: Bed > Chair or W/C: Min A (steadying Pt. > 75%);4: Chair or W/C > Bed: Min A (steadying Pt. > 75%);7: Supine > Sit: No assist;5: Sit > Supine: Supervision (verbal cues/safety issues)  FIM - Locomotion: Wheelchair Distance: 100 Locomotion: Wheelchair: 1: Total Assistance/staff pushes wheelchair (Pt<25%) FIM - Locomotion: Ambulation Locomotion: Ambulation Assistive Devices: Administrator Ambulation/Gait Assistance: 4: Min assist Locomotion: Ambulation: 2: Travels 50 - 149 ft with minimal assistance (Pt.>75%)  Comprehension Comprehension Mode: Auditory Comprehension: 4-Understands basic 75 - 89% of the time/requires cueing 10 - 24% of the time  Expression Expression Mode: Verbal Expression: 3-Expresses basic 50 - 74% of the time/requires cueing 25 - 50% of the time. Needs to repeat parts of sentences.  Social Interaction Social Interaction: 4-Interacts appropriately 75 - 89% of the time - Needs redirection for appropriate language or to initiate interaction.  Problem Solving Problem Solving: 2-Solves  basic 25 - 49% of the time - needs direction more than half the time to initiate, plan or complete simple activities  Memory Memory: 2-Recognizes or recalls 25 - 49% of the time/requires cueing 51 - 75% of the time   Medical Problem List and Plan:  1. DVT Prophylaxis/Anticoagulation: Pharmaceutical: Xarelto  2. Pain Management: tylenol  3. Mood: monitor for depression, encourage interaction  4. Neuropsych: This patient is not capable of making decisions on his own behalf.  5. Afib Xarelto  6. Resp- recent Influenza and hx COPD- IS,monitor  7.  Dysphagia improving d/c IV LOS (Days) 9 A FACE TO FACE EVALUATION WAS PERFORMED  Jasime Westergren E 01/18/2014, 6:36 AM

## 2014-01-18 NOTE — Progress Notes (Signed)
Social Work Patient ID: [name], male   DOB: 1927/04/03, 80 y.o.   MRN: [id number]  CSW met with pt/pt's family on 01-16-14 to update them on team conference.  Pt's wife and son are pleased with his progress and happy to learn his d/c date will be on 01-31-14.  They have no questions/concerns for the team and are very appreciative of the care they have received on the rehab unit.  Pt's wife continues to feel she can provide pt with the care he will need when he leaves rehab.  CSW will continue to support pt and wife toward this goal.

## 2014-01-19 ENCOUNTER — Inpatient Hospital Stay (HOSPITAL_COMMUNITY): Payer: Medicare Other

## 2014-01-19 ENCOUNTER — Inpatient Hospital Stay (HOSPITAL_COMMUNITY): Payer: Medicare Other | Admitting: Speech Pathology

## 2014-01-19 DIAGNOSIS — R5381 Other malaise: Secondary | ICD-10-CM

## 2014-01-19 DIAGNOSIS — I69920 Aphasia following unspecified cerebrovascular disease: Secondary | ICD-10-CM

## 2014-01-19 DIAGNOSIS — I6340 Cerebral infarction due to embolism of unspecified cerebral artery: Secondary | ICD-10-CM

## 2014-01-19 DIAGNOSIS — G8110 Spastic hemiplegia affecting unspecified side: Secondary | ICD-10-CM

## 2014-01-19 LAB — URINALYSIS, ROUTINE W REFLEX MICROSCOPIC
Bilirubin Urine: NEGATIVE
Glucose, UA: NEGATIVE mg/dL
KETONES UR: NEGATIVE mg/dL
NITRITE: NEGATIVE
PH: 6 (ref 5.0–8.0)
Protein, ur: 100 mg/dL — AB
Specific Gravity, Urine: 1.022 (ref 1.005–1.030)
UROBILINOGEN UA: 0.2 mg/dL (ref 0.0–1.0)

## 2014-01-19 LAB — URINE MICROSCOPIC-ADD ON

## 2014-01-19 NOTE — Progress Notes (Signed)
80 y.o. 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 He was admitted 12/25/2013 with altered mental status and low grade fever of 100 Fahrenheit and reported hematuria. Cranial CT scan negative and MRI of the brain showed punctate acute infarct in the high left parietal lobe as well as extensive chronic ischemic changes. Patient's Xarelto held for a short time secondary to hematuria. He was started on IV rocephin due to SIRS as well as Tamiflu as he tested positive for type A influenza. He was placed on Dysphagia 1 nectar thick liquid diet per ST recommendations. Patient with difficulty with processing with easy distractibility, as well as problems with initiation. Needs tactile cues for basic tasks and mobility. Mentation improving and neurology recommends resuming Xarelto for embolic stroke due to A Fib  On 01/05/14, he was completing his shower when he developed acute onset of right sided weakness with speech difficulty. Code stroke was initiated and he was transferred to acute services. MRI 01/06/2014 revealed Left PCA distribution infarct  Subjective/Complaints: No new complaints. Patient comfortable. Slept well per RN. Review of Systems - difficult to obtain secondary to aphasia and reduced attn Objective: Vital Signs: Blood pressure 127/81, pulse 131, temperature 98 F (36.7 C), temperature source Oral, resp. rate 17, height 6' 2.02" (1.88 m), weight 56.8 kg (125 lb 3.5 oz), SpO2 95.00%. No results found. Results for orders placed during the hospital encounter of 01/09/14 (from the past 72 hour(s))  BASIC METABOLIC PANEL     Status: Abnormal   Collection Time    01/18/14  5:57 AM      Result Value Range   Sodium 142  137 - 147 mEq/L   Potassium 4.6  3.7 - 5.3 mEq/L   Chloride 105  96 - 112 mEq/L   CO2 26  19 - 32 mEq/L   Glucose, Bld 86  70 - 99 mg/dL   BUN 23   6 - 23 mg/dL   Creatinine, Ser 0.97  0.50 - 1.35 mg/dL   Calcium 8.8  8.4 - 10.5 mg/dL   GFR calc non Af Amer 74 (*) >90 mL/min   GFR calc Af Amer 85 (*) >90 mL/min   Comment: (NOTE)     The eGFR has been calculated using the CKD EPI equation.     This calculation has not been validated in all clinical situations.     eGFR's persistently <90 mL/min signify possible Chronic Kidney     Disease.  CBC     Status: Abnormal   Collection Time    01/18/14  5:57 AM      Result Value Range   WBC 4.4  4.0 - 10.5 K/uL   RBC 3.17 (*) 4.22 - 5.81 MIL/uL   Hemoglobin 10.3 (*) 13.0 - 17.0 g/dL   HCT 30.7 (*) 39.0 - 52.0 %   MCV 96.8  78.0 - 100.0 fL   MCH 32.5  26.0 - 34.0 pg   MCHC 33.6  30.0 - 36.0 g/dL   RDW 14.9  11.5 - 15.5 %   Platelets 253  150 - 400 K/uL      Constitutional: He appears well-developed and well-nourished.  HENT:  Head: Normocephalic and atraumatic.  Eyes: Conjunctivae and EOM are normal. Pupils are equal, round, and reactive to light.  Neck: Normal range of motion. Neck supple.  Cardiovascular: Normal rate and normal heart sounds.  An irregularly irregular rhythm present.  Respiratory: Effort normal. No respiratory distress. He has decreased breath sounds in the right lower field and the left lower field.  Neurological: He is alert. He exhibits normal muscle tone.  Motor strength is 3 minus right deltoid, bicep, tricep, grip, right ankle dorsiflexor 4 minus right hip flexor knee extensor 5/5 left deltoid, bicep, tricep, grip, hip flexors, knee extensors, ankle dorsiflexor plantar flexor   Assessment/Plan: 1. Functional deficits secondary to L PCA embolic infarct 8/54/6270 superimposed on L high parietal infarct onset 12/25/2013 which require 3+ hours per day of interdisciplinary therapy in a comprehensive inpatient rehab setting. Physiatrist is providing close team supervision and 24 hour management of active medical problems listed below. Physiatrist and rehab team  continue to assess barriers to discharge/monitor patient progress toward functional and medical goals. FIM: FIM - Bathing Bathing Steps Patient Completed: Chest;Right Arm;Abdomen;Right upper leg;Left upper leg;Left Arm;Front perineal area;Right lower leg (including foot);Left lower leg (including foot) Bathing: 4: Min-Patient completes 8-9 68f 10 parts or 75+ percent  FIM - Upper Body Dressing/Undressing Upper body dressing/undressing steps patient completed: Thread/unthread right sleeve of pullover shirt/dresss;Thread/unthread left sleeve of pullover shirt/dress;Put head through opening of pull over shirt/dress Upper body dressing/undressing: 4: Min-Patient completed 75 plus % of tasks FIM - Lower Body Dressing/Undressing Lower body dressing/undressing steps patient completed: Thread/unthread right underwear leg;Thread/unthread left underwear leg;Thread/unthread right pants leg;Thread/unthread left pants leg;Don/Doff right sock;Don/Doff left sock Lower body dressing/undressing: 4: Min-Patient completed 75 plus % of tasks  FIM - Toileting Toileting steps completed by patient: Adjust clothing prior to toileting;Performs perineal hygiene Toileting Assistive Devices: Grab bar or rail for support Toileting: 3: Mod-Patient completed 2 of 3 steps  FIM - Radio producer Devices: Elevated toilet seat;Grab bars;Walker Toilet Transfers: 4-To toilet/BSC: Min A (steadying Pt. > 75%);3-From toilet/BSC: Mod A (lift or lower assist)  FIM - Control and instrumentation engineer Devices: Walker;Arm rests Bed/Chair Transfer: 6: Supine > Sit: No assist;6: Sit > Supine: No assist;4: Bed > Chair or W/C: Min A (steadying Pt. > 75%);4: Chair or W/C > Bed: Min A (steadying Pt. > 75%)  FIM - Locomotion: Wheelchair Distance: 100 Locomotion: Wheelchair: 2: Travels 50 - 149 ft with supervision, cueing or coaxing FIM - Locomotion: Ambulation Locomotion: Ambulation Assistive  Devices: Administrator Ambulation/Gait Assistance: 4: Min assist Locomotion: Ambulation: 2: Travels 50 - 149 ft with minimal assistance (Pt.>75%)  Comprehension Comprehension Mode: Auditory Comprehension: 5-Understands basic 90% of the time/requires cueing < 10% of the time  Expression Expression Mode: Verbal Expression: 3-Expresses basic 50 - 74% of the time/requires cueing 25 - 50% of the time. Needs to repeat parts of sentences.  Social Interaction Social Interaction: 4-Interacts appropriately 75 - 89% of the time - Needs redirection for appropriate language or to initiate interaction.  Problem Solving Problem Solving: 2-Solves basic 25 - 49% of the time - needs direction more than half the time to initiate, plan or complete simple activities  Memory Memory: 2-Recognizes or recalls 25 - 49% of the time/requires cueing 51 - 75% of the time   Medical Problem List and Plan:  1. DVT Prophylaxis/Anticoagulation: Pharmaceutical: Xarelto  2. Pain Management: tylenol  3. Mood: monitor for depression, encourage interaction  4. Neuropsych: This patient is not capable of making decisions on his own behalf.  5. Afib Xarelto  6. Resp- recent Influenza and hx COPD- IS,monitor  7.  Dysphagia improving. Off IVF LOS (Days) 10 A FACE TO FACE EVALUATION  WAS PERFORMED  Lavonia Eager T 01/19/2014, 9:02 AM

## 2014-01-19 NOTE — Progress Notes (Signed)
Occupational Therapy Session Note  Patient Details  Name: [name] MRN: [id number] Date of Birth: [birth year]  Today's Date: 01/19/2014 Time: 0930-1030 Time Calculation (min): 60 min  Short Term Goals: Week 2:  OT Short Term Goal 1 (Week 2): Patient will complete toilet transfer with min assist OT Short Term Goal 2 (Week 2): Patient will complete toilet task with steadying assist only OT Short Term Goal 3 (Week 2): Patient will complete oral care using RUE without assist 75% of task OT Short Term Goal 4 (Week 2): Patient will complete LB dressing with steadying assist  Skilled Therapeutic Interventions/Progress Updates:    Pt seen for ADL retraining with focus on functional use of RUE, postural control in standing, and sit<>stand. Pt received sitting in w/c reading comics using L hand to hold magnifying glass. Cued pt to switch hands. Pt sustained grip on handle approx 40 sec however demonstrated rigid movements and decreased control when trying to manage L>R to scan comics, requiring HOH assist. Pt ambulated to bathroom with min assist and cueing for RW and stepping sequence as he tends to leave RLE behind him. Completed bathing using RUE approx 30% of task, not dropping wash cloth but required [facility] for thoroughness of washing. Required min assist for standing balance during self-care tasks and cueing for hand positioning and bil feet positioning. Pt required [facility] assist for gripping pants 50% of time to assist with managing around waist. After dressing, pt requesting to complete toilet task. Pt required min assist for stand pivot transfer and mod instructional cueing as he is fearful of falling. Pt required assist for hygiene as he declines using L hand and unable to d/t ataxia in RUE. At end of session pt left in w/c with wife present and all needs in reach.   Therapy Documentation Precautions:  Precautions Precautions: Fall Precaution Comments: Sensory deficits on R side; may push to  R Restrictions Weight Bearing Restrictions: No General:   Vital Signs:   Pain: No report of pain during therapy session.   See FIM for current functional status  Therapy/Group: Individual Therapy  Duayne Cal 01/19/2014, 10:38 AM

## 2014-01-19 NOTE — Progress Notes (Signed)
Speech Language Pathology Daily Session Note  Patient Details  Name: [name] MRN: [id number] Date of Birth: 04-Mar-1927  Today's Date: 01/19/2014 Time: 5009-3818 Time Calculation (min): 41 min  Short Term Goals: Week 2: SLP Short Term Goal 1 (Week 2): Pt will consume Dys 1 textures and nectar-thick liquids, utilizing safe swallowing strategies with Mod cues SLP Short Term Goal 2 (Week 2): Pt will demonstrate effective mastication and oral clearance with dys 2 textures to assess readiness for diet advancement SLP Short Term Goal 3 (Week 2): Pt will sustain attention to basic functional task for 10 min with Mod cues SLP Short Term Goal 5 (Week 2): Pt will name common objects with 80% accuracy with Supervision cues  Skilled Therapeutic Interventions: Skilled treatment session focused on addressing dysarthria and swallowing goals. SLP facilitated session with Max encouragement for Dys.2 trials and patient consumed 2/4 trials with throat clears which appeared to clear suspected penetrates.  Recommend to continue with trials with SLP.  Patient participated in conversation throughout session with Max cues to recall and utilize increased volume, which mildly improved speech intelligibility. Continue with plan of care.    FIM:  Comprehension Comprehension Mode: Auditory Comprehension: 5-Understands basic 90% of the time/requires cueing < 10% of the time Expression Expression Mode: Verbal Expression: 3-Expresses basic 50 - 74% of the time/requires cueing 25 - 50% of the time. Needs to repeat parts of sentences. Social Interaction Social Interaction: 4-Interacts appropriately 75 - 89% of the time - Needs redirection for appropriate language or to initiate interaction. Problem Solving Problem Solving: 2-Solves basic 25 - 49% of the time - needs direction more than half the time to initiate, plan or complete simple activities Memory Memory: 2-Recognizes or recalls 25 - 49% of the time/requires  cueing 51 - 75% of the time FIM - Eating Eating Activity: 1: Helper feeds patient  Pain Pain Assessment Pain Assessment: No/denies pain  Therapy/Group: Individual Therapy  Carmelia Roller., CCC-SLP [phone]  [place] 01/19/2014, 9:35 AM

## 2014-01-20 ENCOUNTER — Inpatient Hospital Stay (HOSPITAL_COMMUNITY): Payer: Medicare Other | Admitting: Physical Therapy

## 2014-01-20 MED ORDER — CIPROFLOXACIN HCL 250 MG PO TABS
250.0000 mg | ORAL_TABLET | Freq: Two times a day (BID) | ORAL | Status: DC
  Administered 2014-01-20 – 2014-01-21 (×4): 250 mg via ORAL
  Filled 2014-01-20 (×7): qty 1

## 2014-01-20 NOTE — Plan of Care (Signed)
Problem: RH BLADDER ELIMINATION Goal: RH STG MANAGE BLADDER WITH ASSISTANCE STG Manage Bladder With Assistance- Min  Outcome: Not Progressing Condom cath in use at [facility]

## 2014-01-20 NOTE — Progress Notes (Signed)
80 y.o. right handed male with history of COPD, prior CVA 08/2012 with right sided weakness and dysphagia ( CIR stay) , A fib--Xarelto and bladder cancer with ongoing treatment at [facility]. He was admitted 12/25/2013 with altered mental status and low grade fever of 100 Fahrenheit and reported hematuria. Cranial CT scan negative and MRI of the brain showed punctate acute infarct in the high left parietal lobe as well as extensive chronic ischemic changes. Patient's Xarelto held for a short time secondary to hematuria. He was started on IV rocephin due to SIRS as well as Tamiflu as he tested positive for type A influenza. He was placed on Dysphagia 1 nectar thick liquid diet per ST recommendations. Patient with difficulty with processing with easy distractibility, as well as problems with initiation. Needs tactile cues for basic tasks and mobility. Mentation improving and neurology recommends resuming Xarelto for embolic stroke due to A Fib  On 01/05/14, he was completing his shower when he developed acute onset of right sided weakness with speech difficulty. Code stroke was initiated and he was transferred to acute services. MRI 01/06/2014 revealed Left PCA distribution infarct  Subjective/Complaints: Still incontinent of urine.  Review of Systems - difficult to obtain secondary to aphasia and reduced attn Objective: Vital Signs: Blood pressure 129/65, pulse 92, temperature 98 F (36.7 C), temperature source Oral, resp. rate 17, height 6' 2.02" (1.88 m), weight 56.8 kg (125 lb 3.5 oz), SpO2 98.00%. No results found. Results for orders placed during the hospital encounter of 01/09/14 (from the past 72 hour(s))  BASIC METABOLIC PANEL     Status: Abnormal   Collection Time    01/18/14  5:57 AM      Result Value Range   Sodium 142  137 - 147 mEq/L   Potassium 4.6  3.7 - 5.3 mEq/L   Chloride 105  96 - 112 mEq/L   CO2 26  19 - 32 mEq/L   Glucose, Bld 86  70 - 99 mg/dL   BUN 23  6 - 23 mg/dL   Creatinine,  Ser 0.97  0.50 - 1.35 mg/dL   Calcium 8.8  8.4 - 10.5 mg/dL   GFR calc non Af Amer 74 (*) >90 mL/min   GFR calc Af Amer 85 (*) >90 mL/min   Comment: (NOTE)     The eGFR has been calculated using the CKD EPI equation.     This calculation has not been validated in all clinical situations.     eGFR's persistently <90 mL/min signify possible Chronic Kidney     Disease.  CBC     Status: Abnormal   Collection Time    01/18/14  5:57 AM      Result Value Range   WBC 4.4  4.0 - 10.5 K/uL   RBC 3.17 (*) 4.22 - 5.81 MIL/uL   Hemoglobin 10.3 (*) 13.0 - 17.0 g/dL   HCT 30.7 (*) 39.0 - 52.0 %   MCV 96.8  78.0 - 100.0 fL   MCH 32.5  26.0 - 34.0 pg   MCHC 33.6  30.0 - 36.0 g/dL   RDW 14.9  11.5 - 15.5 %   Platelets 253  150 - 400 K/uL  URINALYSIS, ROUTINE W REFLEX MICROSCOPIC     Status: Abnormal   Collection Time    01/19/14  8:30 PM      Result Value Range   Color, Urine RED (*) YELLOW   Comment: BIOCHEMICALS MAY BE AFFECTED BY COLOR   APPearance CLOUDY (*) CLEAR  Specific Gravity, Urine 1.022  1.005 - 1.030   pH 6.0  5.0 - 8.0   Glucose, UA NEGATIVE  NEGATIVE mg/dL   Hgb urine dipstick LARGE (*) NEGATIVE   Bilirubin Urine NEGATIVE  NEGATIVE   Ketones, ur NEGATIVE  NEGATIVE mg/dL   Protein, ur 100 (*) NEGATIVE mg/dL   Urobilinogen, UA 0.2  0.0 - 1.0 mg/dL   Nitrite NEGATIVE  NEGATIVE   Leukocytes, UA MODERATE (*) NEGATIVE  URINE MICROSCOPIC-ADD ON     Status: Abnormal   Collection Time    01/19/14  8:30 PM      Result Value Range   Squamous Epithelial / LPF RARE  RARE   WBC, UA 11-20  <3 WBC/hpf   RBC / HPF TOO NUMEROUS TO COUNT  <3 RBC/hpf   Bacteria, UA MANY (*) RARE      Constitutional: He appears well-developed and well-nourished.  HENT:  Head: Normocephalic and atraumatic.  Eyes: Conjunctivae and EOM are normal. Pupils are equal, round, and reactive to light.  Neck: Normal range of motion. Neck supple.  Cardiovascular: Normal rate and normal heart sounds. An  irregularly irregular rhythm present.  Respiratory: Effort normal. No respiratory distress. He has decreased breath sounds in the right lower field and the left lower field.  Neurological: He is fairly alert. Delayed processing, poor awareness. He exhibits normal muscle tone.  Motor strength is 3 minus right deltoid, bicep, tricep, grip, right ankle dorsiflexor 4 minus right hip flexor knee extensor 5/5 left deltoid, bicep, tricep, grip, hip flexors, knee extensors, ankle dorsiflexor plantar flexor   Assessment/Plan: 1. Functional deficits secondary to L PCA embolic infarct 1/61/0960 superimposed on L high parietal infarct onset 12/25/2013 which require 3+ hours per day of interdisciplinary therapy in a comprehensive inpatient rehab setting. Physiatrist is providing close team supervision and 24 hour management of active medical problems listed below. Physiatrist and rehab team continue to assess barriers to discharge/monitor patient progress toward functional and medical goals. FIM: FIM - Bathing Bathing Steps Patient Completed: Chest;Right Arm;Abdomen;Right upper leg;Left upper leg;Front perineal area;Right lower leg (including foot);Left lower leg (including foot) Bathing: 4: Min-Patient completes 8-9 70f 10 parts or 75+ percent  FIM - Upper Body Dressing/Undressing Upper body dressing/undressing steps patient completed: Thread/unthread right sleeve of pullover shirt/dresss;Thread/unthread left sleeve of pullover shirt/dress;Put head through opening of pull over shirt/dress Upper body dressing/undressing: 4: Min-Patient completed 75 plus % of tasks FIM - Lower Body Dressing/Undressing Lower body dressing/undressing steps patient completed: Thread/unthread right underwear leg;Thread/unthread left underwear leg;Thread/unthread right pants leg;Thread/unthread left pants leg Lower body dressing/undressing: 3: Mod-Patient completed 50-74% of tasks  FIM - Toileting Toileting steps completed by  patient: Adjust clothing prior to toileting Toileting Assistive Devices: Grab bar or rail for support Toileting: 2: Max-Patient completed 1 of 3 steps  FIM - Radio producer Devices: Elevated toilet seat;Grab bars;Walker Toilet Transfers: 4-To toilet/BSC: Min A (steadying Pt. > 75%);4-From toilet/BSC: Min A (steadying Pt. > 75%)  FIM - Control and instrumentation engineer Devices: Walker;Arm rests Bed/Chair Transfer: 6: Supine > Sit: No assist;6: Sit > Supine: No assist;4: Bed > Chair or W/C: Min A (steadying Pt. > 75%);4: Chair or W/C > Bed: Min A (steadying Pt. > 75%)  FIM - Locomotion: Wheelchair Distance: 100 Locomotion: Wheelchair: 2: Travels 50 - 149 ft with supervision, cueing or coaxing FIM - Locomotion: Ambulation Locomotion: Ambulation Assistive Devices: Administrator Ambulation/Gait Assistance: 4: Min assist Locomotion: Ambulation: 2: Travels 50 - 149 ft  with minimal assistance (Pt.>75%)  Comprehension Comprehension Mode: Auditory Comprehension: 5-Understands basic 90% of the time/requires cueing < 10% of the time  Expression Expression Mode: Verbal Expression: 3-Expresses basic 50 - 74% of the time/requires cueing 25 - 50% of the time. Needs to repeat parts of sentences.  Social Interaction Social Interaction: 4-Interacts appropriately 75 - 89% of the time - Needs redirection for appropriate language or to initiate interaction.  Problem Solving Problem Solving: 2-Solves basic 25 - 49% of the time - needs direction more than half the time to initiate, plan or complete simple activities  Memory Memory: 2-Recognizes or recalls 25 - 49% of the time/requires cueing 51 - 75% of the time   Medical Problem List and Plan:  1. DVT Prophylaxis/Anticoagulation: Pharmaceutical: Xarelto  2. Pain Management: tylenol  3. Mood: monitor for depression, encourage interaction  4. Neuropsych: This patient is not capable of making decisions on  his own behalf.  5. Afib Xarelto  6. Resp- recent Influenza and hx COPD- IS,monitor  7.  Dysphagia improving. Off IVF 8. Urinary frequency: he's off IVF. Continues with frequency and incontinence  -ua positive  -culture pending  -begin empiric cipro LOS (Days) 11 A FACE TO FACE EVALUATION WAS PERFORMED  Danish Ruffins T 01/20/2014, 8:19 AM

## 2014-01-20 NOTE — Progress Notes (Signed)
Physical Therapy Session Note  Patient Details  Name: [name] MRN: [id number] Date of Birth: [birth year]  Today's Date: 01/20/2014 Time: 1300-1330 Time Calculation (min): 30 min  Short Term Goals: Week 2:  PT Short Term Goal 1 (Week 2): Patient will perform sit>supine independently with HOB flat and no rails. PT Short Term Goal 2 (Week 2): Patient will performed bed<>w/c transfer with rolling walker and supervision. PT Short Term Goal 3 (Week 2): Patient will perform gait x75' in controlled environment with rolling walker and supervision. PT Short Term Goal 4 (Week 2): Patient will perform w/c mobility x75' in controlled environment with supervision.  Skilled Therapeutic Interventions/Progress Updates:    Pt received sitting in wheelchair with pt's son present. Pt performed sit to stand x 5 with min A with vcs for proper hand and foot placement and sequencing. Gait training with RW with wheelchair behind with min A for 460' without rest with increased assistance with turns and changing direction as pt displays decreased weight shift to L LE displaying shuffle gait pattern, pt with increased heel strike and able to pass stance foot and verbally relayed proper gait to therapist, mild knee buckle noted at end of gait trial due to fatigue, pt was asked to rest but pt very motivated and completed increased distance today.   Therapy Documentation Precautions:  Precautions Precautions: Fall Precaution Comments: Sensory deficits on R side; may push to R Restrictions Weight Bearing Restrictions: No    Pain: Pt reports mild pain in R hand, c/o numbness and tingling unable to quantify.       Locomotion : Ambulation Ambulation/Gait Assistance: 4: Min assist  :           See FIM for current functional status  Therapy/Group: Individual Therapy  Jeanette Caprice 01/20/2014, 4:46 PM

## 2014-01-20 NOTE — Progress Notes (Signed)
ANTICOAGULATION CONSULT NOTE - Follow Up Consult  Pharmacy Consult for xarelto  Indication: atrial fibrillation  Allergies  Allergen Reactions  . Diazepam     REACTION: agitation  . Ezetimibe-Simvastatin     unknown  . Morphine Other (See Comments)    Hallucinations.  . Sulfonamide Derivatives Hives    Patient Measurements: Height: 6' 2.02" (188 cm) Weight: 125 lb 3.5 oz (56.8 kg) IBW/kg (Calculated) : 82.24  Vital Signs: Temp: 98 F (36.7 C) (01/25 0500) Temp src: Oral (01/25 0500) BP: 129/65 mmHg (01/25 0500) Pulse Rate: 92 (01/25 0510)  Labs:  Recent Labs  01/18/14 0557  HGB 10.3*  HCT 30.7*  PLT 253  CREATININE 0.97    Estimated Creatinine Clearance: 45.5 ml/min (by C-G formula based on Cr of 0.97).  Assessment: 40 yof continues on xarelto for afib. Dose is appropriate for renal fxn. No overt bleeding noted. Recent hx of small amount of hematuria without clots on 1/13 (likely related to surgery for bladder cancer). H/H 10.3/30.7, plts 253 as of 1/23.   Goal of Therapy:  Therapeutic anticoagulation   Plan:  1. Continue xarelto 15mg  PO daily 2. Monitor for signs and symptoms of bleeding  Elisabeth Strom, Rande Lawman 01/20/2014,8:40 AM

## 2014-01-21 ENCOUNTER — Inpatient Hospital Stay (HOSPITAL_COMMUNITY): Payer: Medicare Other

## 2014-01-21 ENCOUNTER — Inpatient Hospital Stay (HOSPITAL_COMMUNITY): Payer: Medicare Other | Admitting: Physical Therapy

## 2014-01-21 DIAGNOSIS — R5381 Other malaise: Secondary | ICD-10-CM

## 2014-01-21 DIAGNOSIS — I69920 Aphasia following unspecified cerebrovascular disease: Secondary | ICD-10-CM

## 2014-01-21 DIAGNOSIS — G8110 Spastic hemiplegia affecting unspecified side: Secondary | ICD-10-CM

## 2014-01-21 DIAGNOSIS — I6340 Cerebral infarction due to embolism of unspecified cerebral artery: Secondary | ICD-10-CM

## 2014-01-21 LAB — BASIC METABOLIC PANEL
BUN: 30 mg/dL — AB (ref 6–23)
CALCIUM: 9.1 mg/dL (ref 8.4–10.5)
CHLORIDE: 101 meq/L (ref 96–112)
CO2: 28 meq/L (ref 19–32)
Creatinine, Ser: 1.02 mg/dL (ref 0.50–1.35)
GFR calc Af Amer: 76 mL/min — ABNORMAL LOW (ref >90)
GFR calc non Af Amer: 65 mL/min — ABNORMAL LOW (ref >90)
GLUCOSE: 89 mg/dL (ref 70–99)
Potassium: 4.3 mEq/L (ref 3.7–5.3)
Sodium: 140 mEq/L (ref 137–147)

## 2014-01-21 MED ORDER — SODIUM CHLORIDE 0.45 % IV SOLN
INTRAVENOUS | Status: DC
  Administered 2014-01-21 – 2014-01-23 (×3): via INTRAVENOUS

## 2014-01-21 MED ORDER — SODIUM CHLORIDE 0.45 % IV SOLN: INTRAVENOUS | Status: DC

## 2014-01-21 NOTE — Progress Notes (Signed)
Occupational Therapy Session Note  Patient Details  Name: [name] MRN: [id number] Date of Birth: [birth year]  Today's Date: 01/21/2014 Time: 0830-0930 Time Calculation (min): 60 min  Short Term Goals: Week 2:  OT Short Term Goal 1 (Week 2): Patient will complete toilet transfer with min assist OT Short Term Goal 2 (Week 2): Patient will complete toilet task with steadying assist only OT Short Term Goal 3 (Week 2): Patient will complete oral care using RUE without assist 75% of task OT Short Term Goal 4 (Week 2): Patient will complete LB dressing with steadying assist  Skilled Therapeutic Interventions/Progress Updates:    Pt seen for ADL retraining with focus on standing balance, functional transfers, R NMR, and sequencing. Pt with UTI and increased confusion during therapy session. Pt received sitting on toilet with wife present. Completed all transfers min assist stand pivot and mod cueing and assist for RW safety. Pt with increased difficulty using RUE secondary to perceptual deficit, requiring HOH assist at times. Pt required cueing for problem solving to use LUE at times. Pt required increased time for dressing and HOH for RUE to pull pants past knees. Completed oral care in standing while weight bearing through RUE and wife providing HOH assist to manage toothbrush with LUE as pt just placing in mouth and not making brushing motion despite visual and and verbal cueing. At end of session pt left sitting in w/c with all needs in reach and wife present.   Therapy Documentation Precautions:  Precautions Precautions: Fall Precaution Comments: Sensory deficits on R side; may push to R Restrictions Weight Bearing Restrictions: No General:   Vital Signs:   Pain: No report of pain during therapy session.   See FIM for current functional status  Therapy/Group: Individual Therapy  Duayne Cal 01/21/2014, 12:01 PM

## 2014-01-21 NOTE — Progress Notes (Signed)
Patient fatigued and harder to participate in therapy. Will resume IVF now to help with hydration. Started on cipro--await [facility]. Push po fluids.

## 2014-01-21 NOTE — Progress Notes (Signed)
Physical Therapy Session Note  Patient Details  Name: [name] MRN: [id number] Date of Birth: [birth year]  Today's Date: 01/21/2014 Time: 1001-1059 Time Calculation (min): 58 min  Short Term Goals: PT Short Term Goal 1 (Week 2): Patient will perform sit>supine independently with HOB flat and no rails. PT Short Term Goal 2 (Week 2): Patient will performed bed<>w/c transfer with rolling walker and supervision. PT Short Term Goal 3 (Week 2): Patient will perform gait x75' in controlled environment with rolling walker and supervision. PT Short Term Goal 4 (Week 2): Patient will perform w/c mobility x75' in controlled environment with supervision.  Skilled Therapeutic Interventions/Progress Updates:    Pt received seated in w/c with wife present; agreeable to session. Orient x4 with mod verbal cueing for use of external aid to orient self to date. Pt requires increased processing time throughout session.  Session focused on increasing quality of gait and functional transfers. Gait x125', x57' (seated rest break x3 minutes between gait trials) in controlled environment with rolling walker and min guard-min A; multimodal cueing to address lateral trunk lean to R during gait; pt with effective within-session carryover. Pt continues to exhibit excessive R-sided trunk lean, LE scissoring with turning during gait with rolling walker.   Pt required mod-max cueing for safe/proper setup, body mechanics with initial 3 sit>stand transfers from w/c. Therefore, remainder of session focused on sit>stand transfers from mat table to rolling walker. Verbal and demonstration cues focused on setup, hand placement, and anterior weight shifting. Pt initially gave effective return demonstration, as exhibited by final 2 sit<>stand transfers with supervision.  Returned to room via w/c mobility x120' in controlled environment with bilat UE's and supervision. Once in room, performed stand pivot from w/c>bedside chair with  effective carryover of ~50% of cues provided for sit>stand transfer. Therapist departed with pt seated in bedside chair (bilat LE's elevated) with all needs within reach.  Therapy Documentation Precautions:  Precautions Precautions: Fall Precaution Comments: Sensory deficits on R side; may push to R Restrictions Weight Bearing Restrictions: No Pain: Pain Assessment Pain Assessment: No/denies pain Pain Score: 0-No pain Locomotion : Ambulation Ambulation/Gait Assistance: 4: Min assist Wheelchair Mobility Distance: 120   See FIM for current functional status  Therapy/Group: Individual Therapy  [name], Malva Cogan 01/21/2014, 12:25 PM

## 2014-01-21 NOTE — Progress Notes (Addendum)
80 y.o. 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 He was admitted 12/25/2013 with altered mental status and low grade fever of 100 Fahrenheit and reported hematuria. Cranial CT scan negative and MRI of the brain showed punctate acute infarct in the high left parietal lobe as well as extensive chronic ischemic changes. Patient's Xarelto held for a short time secondary to hematuria. He was started on IV rocephin due to SIRS as well as Tamiflu as he tested positive for type A influenza. He was placed on Dysphagia 1 nectar thick liquid diet per ST recommendations. Patient with difficulty with processing with easy distractibility, as well as problems with initiation. Needs tactile cues for basic tasks and mobility. Mentation improving and neurology recommends resuming Xarelto for embolic stroke due to A Fib  On 01/05/14, he was completing his shower when he developed acute onset of right sided weakness with speech difficulty. Code stroke was initiated and he was transferred to acute services. MRI 01/06/2014 revealed Left PCA distribution infarct  Subjective/Complaints: Still incontinent of urine. UA positive , no burning Review of Systems - difficult to obtain secondary to aphasia and reduced attn Objective: Vital Signs: Blood pressure 108/73, pulse 115, temperature 98.1 F (36.7 C), temperature source Oral, resp. rate 18, height 6' 2.02" (1.88 m), weight 56.8 kg (125 lb 3.5 oz), SpO2 92.00%. No results found. Results for orders placed during the hospital encounter of 01/09/14 (from the past 72 hour(s))  URINALYSIS, ROUTINE W REFLEX MICROSCOPIC     Status: Abnormal   Collection Time    01/19/14  8:30 PM      Result Value Range   Color, Urine RED (*) YELLOW   Comment: BIOCHEMICALS MAY BE AFFECTED BY COLOR   APPearance CLOUDY (*) CLEAR   Specific Gravity, Urine 1.022   1.005 - 1.030   pH 6.0  5.0 - 8.0   Glucose, UA NEGATIVE  NEGATIVE mg/dL   Hgb urine dipstick LARGE (*) NEGATIVE   Bilirubin Urine NEGATIVE  NEGATIVE   Ketones, ur NEGATIVE  NEGATIVE mg/dL   Protein, ur 100 (*) NEGATIVE mg/dL   Urobilinogen, UA 0.2  0.0 - 1.0 mg/dL   Nitrite NEGATIVE  NEGATIVE   Leukocytes, UA MODERATE (*) NEGATIVE  URINE MICROSCOPIC-ADD ON     Status: Abnormal   Collection Time    01/19/14  8:30 PM      Result Value Range   Squamous Epithelial / LPF RARE  RARE   WBC, UA 11-20  <3 WBC/hpf   RBC / HPF TOO NUMEROUS TO COUNT  <3 RBC/hpf   Bacteria, UA MANY (*) RARE  BASIC METABOLIC PANEL     Status: Abnormal   Collection Time    01/21/14  5:50 AM      Result Value Range   Sodium 140  137 - 147 mEq/L   Potassium 4.3  3.7 - 5.3 mEq/L   Chloride 101  96 - 112 mEq/L   CO2 28  19 - 32 mEq/L   Glucose, Bld 89  70 - 99 mg/dL   BUN 30 (*) 6 - 23 mg/dL   Creatinine, Ser 1.02  0.50 - 1.35 mg/dL   Calcium 9.1  8.4 - 10.5 mg/dL   GFR calc non Af Amer 65 (*) >90 mL/min   GFR calc Af Amer 76 (*) >90 mL/min   Comment: (NOTE)  The eGFR has been calculated using the CKD EPI equation.     This calculation has not been validated in all clinical situations.     eGFR's persistently <90 mL/min signify possible Chronic Kidney     Disease.      Constitutional: He appears well-developed and well-nourished.  HENT:  Head: Normocephalic and atraumatic.  Eyes: Conjunctivae and EOM are normal. Pupils are equal, round, and reactive to light.  Neck: Normal range of motion. Neck supple.  Cardiovascular: Normal rate and normal heart sounds. An irregularly irregular rhythm present.  Respiratory: Effort normal. No respiratory distress. He has decreased breath sounds in the right lower field and the left lower field.  Neurological: He is fairly alert. Delayed processing, poor awareness. He exhibits normal muscle tone.  Motor strength is 3 minus right deltoid, bicep, tricep, grip, right  ankle dorsiflexor 4 minus right hip flexor knee extensor 5/5 left deltoid, bicep, tricep, grip, hip flexors, knee extensors, ankle dorsiflexor plantar flexor   Assessment/Plan: 1. Functional deficits secondary to L PCA embolic infarct 6/96/7893 superimposed on L high parietal infarct onset 12/25/2013 which require 3+ hours per day of interdisciplinary therapy in a comprehensive inpatient rehab setting. Physiatrist is providing close team supervision and 24 hour management of active medical problems listed below. Physiatrist and rehab team continue to assess barriers to discharge/monitor patient progress toward functional and medical goals. FIM: FIM - Bathing Bathing Steps Patient Completed: Chest;Right Arm;Abdomen;Right upper leg;Left upper leg;Front perineal area;Right lower leg (including foot);Left lower leg (including foot) Bathing: 4: Min-Patient completes 8-9 37f10 parts or 75+ percent  FIM - Upper Body Dressing/Undressing Upper body dressing/undressing steps patient completed: Thread/unthread right sleeve of pullover shirt/dresss;Thread/unthread left sleeve of pullover shirt/dress;Put head through opening of pull over shirt/dress Upper body dressing/undressing: 4: Min-Patient completed 75 plus % of tasks FIM - Lower Body Dressing/Undressing Lower body dressing/undressing steps patient completed: Thread/unthread right underwear leg;Thread/unthread left underwear leg;Thread/unthread right pants leg;Thread/unthread left pants leg Lower body dressing/undressing: 3: Mod-Patient completed 50-74% of tasks  FIM - Toileting Toileting steps completed by patient: Adjust clothing prior to toileting Toileting Assistive Devices: Grab bar or rail for support Toileting: 2: Max-Patient completed 1 of 3 steps  FIM - TRadio producerDevices: Elevated toilet seat;Grab bars;Walker Toilet Transfers: 4-To toilet/BSC: Min A (steadying Pt. > 75%);4-From toilet/BSC: Min A  (steadying Pt. > 75%)  FIM - BControl and instrumentation engineerDevices: Walker;Arm rests Bed/Chair Transfer: 4: Bed > Chair or W/C: Min A (steadying Pt. > 75%)  FIM - Locomotion: Wheelchair Distance: 100 Locomotion: Wheelchair: 2: Travels 556- 149 ft with supervision, cueing or coaxing FIM - Locomotion: Ambulation Locomotion: Ambulation Assistive Devices: WAdministratorAmbulation/Gait Assistance: 4: Min assist Locomotion: Ambulation: 4: Travels 150 ft or more with minimal assistance (Pt.>75%)  Comprehension Comprehension Mode: Auditory Comprehension: 5-Understands basic 90% of the time/requires cueing < 10% of the time  Expression Expression Mode: Verbal Expression: 3-Expresses basic 50 - 74% of the time/requires cueing 25 - 50% of the time. Needs to repeat parts of sentences.  Social Interaction Social Interaction: 4-Interacts appropriately 75 - 89% of the time - Needs redirection for appropriate language or to initiate interaction.  Problem Solving Problem Solving: 2-Solves basic 25 - 49% of the time - needs direction more than half the time to initiate, plan or complete simple activities  Memory Memory: 2-Recognizes or recalls 25 - 49% of the time/requires cueing 51 - 75% of the time   Medical Problem  List and Plan:  1. DVT Prophylaxis/Anticoagulation: Pharmaceutical: Xarelto  2. Pain Management: tylenol  3. Mood: monitor for depression, encourage interaction  4. Neuropsych: This patient is not capable of making decisions on his own behalf.  5. Afib Xarelto  6. Resp- recent Influenza and hx COPD- IS,monitor  7.  Dysphagia improving. BUN/CReat rising Off IVF, will resume .45 NS 8. Urinary frequency: he's off IVF. Continues with frequency and incontinence  -ua positive  -culture pending  - empiric cipro LOS (Days) 12 A FACE TO FACE EVALUATION WAS PERFORMED  Leani Myron E 01/21/2014, 6:47 AM

## 2014-01-21 NOTE — Progress Notes (Addendum)
Speech Language Pathology Daily Session Notes  Patient Details  Name: [name] MRN: [id number] Date of Birth: 12-30-1926  Today's Date: 01/21/2014 Time #1: 1130-1225 Time Calculation (min): 55 min  Time #2: 3500-9381 Time Calculation (min): 45 min  Short Term Goals: Week 2: SLP Short Term Goal 1 (Week 2): Pt will consume Dys 1 textures and nectar-thick liquids, utilizing safe swallowing strategies with Mod cues SLP Short Term Goal 2 (Week 2): Pt will demonstrate effective mastication and oral clearance with dys 2 textures to assess readiness for diet advancement SLP Short Term Goal 3 (Week 2): Pt will sustain attention to basic functional task for 10 min with Mod cues SLP Short Term Goal 5 (Week 2): Pt will name common objects with 80% accuracy with Supervision cues  Skilled Therapeutic Interventions: Session #1: Skilled treatment focused on swallowing and cognitive goals. SLP facilitated session with skilled observation of advanced lunch meal with Dys 2 textures and nectar thick liquids via cup sips. Pt exhibited throat clear x2 throughout meal, which appeared adequate to clear possible penetrates. He exhibited mild-moderately prolonged mastication with tough meats, with adequate oral clearance of solid POs. Pt required Min cues for initiation of self-feeding task. Recommend to continue with Dys 2 textures as tolerated.   Session #2: Skilled treatment focused on cognitive and swallowing goals. SLP facilitated session with PO trials of nectar thick liquids, which pt consumed with cough x1 following multiple consecutive sips. Pt requiring Mod cues throughout session for use of increased vocal intensity to increase speech intelligibility, and adequately verbalized his need to go to the bathroom. Pt followed one- and two-step commands throughout transfers and toileting with Min-Mod cues. Pt participated in structured cognitive task focused on sustained attention, scanning, and sorting for the  remaining ~10 minutes of his session. He required Min cues for accuracy, demonstrating adequate sustained attention with supervision. Continue plan of care.  FIM:  Comprehension Comprehension Mode: Auditory Comprehension: 4-Understands basic 75 - 89% of the time/requires cueing 10 - 24% of the time Expression Expression Mode: Verbal Expression: 3-Expresses basic 50 - 74% of the time/requires cueing 25 - 50% of the time. Needs to repeat parts of sentences. Social Interaction Social Interaction: 4-Interacts appropriately 75 - 89% of the time - Needs redirection for appropriate language or to initiate interaction. Problem Solving Problem Solving: 2-Solves basic 25 - 49% of the time - needs direction more than half the time to initiate, plan or complete simple activities Memory Memory: 2-Recognizes or recalls 25 - 49% of the time/requires cueing 51 - 75% of the time FIM - Eating Eating Activity: 5: Set-up assist for open containers;5: Needs verbal cues/supervision;5: Set-up assist for cut food;4: Helper checks for pocketed food  Pain  No/denies pain  Therapy/Group: Individual Therapy   Germain Osgood, M.A. CCC-SLP [phone]   Germain Osgood 01/21/2014, 4:43 PM

## 2014-01-22 ENCOUNTER — Inpatient Hospital Stay (HOSPITAL_COMMUNITY): Payer: Medicare Other | Admitting: Speech Pathology

## 2014-01-22 ENCOUNTER — Inpatient Hospital Stay (HOSPITAL_COMMUNITY): Payer: Medicare Other

## 2014-01-22 ENCOUNTER — Inpatient Hospital Stay (HOSPITAL_COMMUNITY): Payer: Medicare Other | Admitting: Physical Therapy

## 2014-01-22 LAB — URINE CULTURE
Colony Count: 65000
Special Requests: NORMAL

## 2014-01-22 MED ORDER — CEPHALEXIN 250 MG PO CAPS
250.0000 mg | ORAL_CAPSULE | Freq: Three times a day (TID) | ORAL | Status: AC
  Administered 2014-01-22 – 2014-01-28 (×21): 250 mg via ORAL
  Filled 2014-01-22 (×21): qty 1

## 2014-01-22 NOTE — Progress Notes (Signed)
80 y.o. right handed male with history of COPD, prior CVA 08/2012 with right sided weakness and dysphagia ( CIR stay) , A fib--Xarelto and bladder cancer with ongoing treatment at [facility]. He was admitted 12/25/2013 with altered mental status and low grade fever of 100 Fahrenheit and reported hematuria. Cranial CT scan negative and MRI of the brain showed punctate acute infarct in the high left parietal lobe as well as extensive chronic ischemic changes. Patient's Xarelto held for a short time secondary to hematuria. He was started on IV rocephin due to SIRS as well as Tamiflu as he tested positive for type A influenza. He was placed on Dysphagia 1 nectar thick liquid diet per ST recommendations. Patient with difficulty with processing with easy distractibility, as well as problems with initiation. Needs tactile cues for basic tasks and mobility. Mentation improving and neurology recommends resuming Xarelto for embolic stroke due to A Fib  On 01/05/14, he was completing his shower when he developed acute onset of right sided weakness with speech difficulty. Code stroke was initiated and he was transferred to acute services. MRI 01/06/2014 revealed Left PCA distribution infarct  Subjective/Complaints: Still incontinent of urine. UA positive , no burning Review of Systems - difficult to obtain secondary to aphasia and reduced attn Objective: Vital Signs: Blood pressure 106/70, pulse 123, temperature 98 F (36.7 C), temperature source Oral, resp. rate 18, height 6' 2.02" (1.88 m), weight 56.8 kg (125 lb 3.5 oz), SpO2 95.00%. No results found. Results for orders placed during the hospital encounter of 01/09/14 (from the past 72 hour(s))  URINE CULTURE     Status: None   Collection Time    01/19/14  8:30 PM      Result Value Range   Specimen Description URINE, CLEAN CATCH     Special Requests Normal     Culture  Setup Time       Value: 01/20/2014 16:30     Performed at [facility] Count        Value: 65,000 COLONIES/ML     Performed at [facility]   Culture       Value: KLEBSIELLA PNEUMONIAE     Performed at [facility]   Report Status 01/22/2014 FINAL     Organism ID, Bacteria KLEBSIELLA PNEUMONIAE    URINALYSIS, ROUTINE W REFLEX MICROSCOPIC     Status: Abnormal   Collection Time    01/19/14  8:30 PM      Result Value Range   Color, Urine RED (*) YELLOW   Comment: BIOCHEMICALS MAY BE AFFECTED BY COLOR   APPearance CLOUDY (*) CLEAR   Specific Gravity, Urine 1.022  1.005 - 1.030   pH 6.0  5.0 - 8.0   Glucose, UA NEGATIVE  NEGATIVE mg/dL   Hgb urine dipstick LARGE (*) NEGATIVE   Bilirubin Urine NEGATIVE  NEGATIVE   Ketones, ur NEGATIVE  NEGATIVE mg/dL   Protein, ur 100 (*) NEGATIVE mg/dL   Urobilinogen, UA 0.2  0.0 - 1.0 mg/dL   Nitrite NEGATIVE  NEGATIVE   Leukocytes, UA MODERATE (*) NEGATIVE  URINE MICROSCOPIC-ADD ON     Status: Abnormal   Collection Time    01/19/14  8:30 PM      Result Value Range   Squamous Epithelial / LPF RARE  RARE   WBC, UA 11-20  <3 WBC/hpf   RBC / HPF TOO NUMEROUS TO COUNT  <3 RBC/hpf   Bacteria, UA MANY (*) RARE  BASIC METABOLIC PANEL  Status: Abnormal   Collection Time    01/21/14  5:50 AM      Result Value Range   Sodium 140  137 - 147 mEq/L   Potassium 4.3  3.7 - 5.3 mEq/L   Chloride 101  96 - 112 mEq/L   CO2 28  19 - 32 mEq/L   Glucose, Bld 89  70 - 99 mg/dL   BUN 30 (*) 6 - 23 mg/dL   Creatinine, Ser 1.02  0.50 - 1.35 mg/dL   Calcium 9.1  8.4 - 10.5 mg/dL   GFR calc non Af Amer 65 (*) >90 mL/min   GFR calc Af Amer 76 (*) >90 mL/min   Comment: (NOTE)     The eGFR has been calculated using the CKD EPI equation.     This calculation has not been validated in all clinical situations.     eGFR's persistently <90 mL/min signify possible Chronic Kidney     Disease.      Constitutional: He appears well-developed and well-nourished.  HENT:  Head: Normocephalic and atraumatic.  Eyes: Conjunctivae and  EOM are normal. Pupils are equal, round, and reactive to light.  Neck: Normal range of motion. Neck supple.  Cardiovascular: Normal rate and normal heart sounds. An irregularly irregular rhythm present.  Respiratory: Effort normal. No respiratory distress. He has decreased breath sounds in the right lower field and the left lower field.  Neurological: He is fairly alert. Delayed processing, poor awareness. He exhibits normal muscle tone.  Motor strength is 3 minus right deltoid, bicep, tricep, grip, right ankle dorsiflexor 4 minus right hip flexor knee extensor 5/5 left deltoid, bicep, tricep, grip, hip flexors, knee extensors, ankle dorsiflexor plantar flexor   Assessment/Plan: 1. Functional deficits secondary to L PCA embolic infarct 3/32/9518 superimposed on L high parietal infarct onset 12/25/2013 which require 3+ hours per day of interdisciplinary therapy in a comprehensive inpatient rehab setting. Physiatrist is providing close team supervision and 24 hour management of active medical problems listed below. Physiatrist and rehab team continue to assess barriers to discharge/monitor patient progress toward functional and medical goals. FIM: FIM - Bathing Bathing Steps Patient Completed: Chest;Right Arm;Abdomen;Right upper leg;Left upper leg;Front perineal area;Right lower leg (including foot);Left lower leg (including foot) Bathing: 4: Min-Patient completes 8-9 30f 10 parts or 75+ percent  FIM - Upper Body Dressing/Undressing Upper body dressing/undressing steps patient completed: Thread/unthread left sleeve of pullover shirt/dress;Put head through opening of pull over shirt/dress Upper body dressing/undressing: 3: Mod-Patient completed 50-74% of tasks FIM - Lower Body Dressing/Undressing Lower body dressing/undressing steps patient completed: Thread/unthread right underwear leg;Thread/unthread left underwear leg;Thread/unthread right pants leg;Thread/unthread left pants leg Lower body  dressing/undressing: 3: Mod-Patient completed 50-74% of tasks  FIM - Toileting Toileting steps completed by patient: Adjust clothing prior to toileting Toileting Assistive Devices: Grab bar or rail for support Toileting: 2: Max-Patient completed 1 of 3 steps  FIM - Radio producer Devices: Elevated toilet seat;Grab bars;Walker Toilet Transfers: 4-To toilet/BSC: Min A (steadying Pt. > 75%);4-From toilet/BSC: Min A (steadying Pt. > 75%)  FIM - Control and instrumentation engineer Devices: Walker;Arm rests Bed/Chair Transfer: 4: Bed > Chair or W/C: Min A (steadying Pt. > 75%);4: Chair or W/C > Bed: Min A (steadying Pt. > 75%)  FIM - Locomotion: Wheelchair Distance: 120 Locomotion: Wheelchair: 2: Travels 50 - 149 ft with supervision, cueing or coaxing FIM - Locomotion: Ambulation Locomotion: Ambulation Assistive Devices: Administrator Ambulation/Gait Assistance: 4: Min assist Locomotion: Ambulation: 2:  Travels 50 - 149 ft with minimal assistance (Pt.>75%)  Comprehension Comprehension Mode: Auditory Comprehension: 4-Understands basic 75 - 89% of the time/requires cueing 10 - 24% of the time  Expression Expression Mode: Verbal Expression: 3-Expresses basic 50 - 74% of the time/requires cueing 25 - 50% of the time. Needs to repeat parts of sentences.  Social Interaction Social Interaction: 4-Interacts appropriately 75 - 89% of the time - Needs redirection for appropriate language or to initiate interaction.  Problem Solving Problem Solving: 2-Solves basic 25 - 49% of the time - needs direction more than half the time to initiate, plan or complete simple activities  Memory Memory: 2-Recognizes or recalls 25 - 49% of the time/requires cueing 51 - 75% of the time   Medical Problem List and Plan:  1. DVT Prophylaxis/Anticoagulation: Pharmaceutical: Xarelto  2. Pain Management: tylenol  3. Mood: monitor for depression, encourage interaction   4. Neuropsych: This patient is not capable of making decisions on his own behalf.  5. Afib Xarelto  6. Resp- recent Influenza and hx COPD- IS,monitor  7.  Dysphagia improving. BUN/CReat rising Off IVF, will resume .45 NS 8. Urinary frequency: he's off IVF. Continues with frequency and incontinence  -ua positive  -culture +Kleb P  - empiric cipro LOS (Days) 13 A FACE TO FACE EVALUATION WAS PERFORMED  KIRSTEINS,ANDREW E 01/22/2014, 7:10 AM

## 2014-01-22 NOTE — Progress Notes (Signed)
Speech Language Pathology Daily Session Note  Patient Details  Name: [name] MRN: [id number] Date of Birth: 1927/08/18  Today's Date: 01/22/2014 Time: 6301-6010 Time Calculation (min): 45 min  Short Term Goals: Week 2: SLP Short Term Goal 1 (Week 2): Pt will consume Dys 1 textures and nectar-thick liquids, utilizing safe swallowing strategies with Mod cues SLP Short Term Goal 2 (Week 2): Pt will demonstrate effective mastication and oral clearance with dys 2 textures to assess readiness for diet advancement SLP Short Term Goal 3 (Week 2): Pt will sustain attention to basic functional task for 10 min with Mod cues SLP Short Term Goal 5 (Week 2): Pt will name common objects with 80% accuracy with Supervision cues  Skilled Therapeutic Interventions: Treatment focus on cognitive-linguistic goals. SLP facilitated session by providing Mod A verbal cues for utilization of an increased vocal intensity at the word and phrase level. Pt named functional items with 100% accuracy with supervision phonemic cues and participated in comparing/contrasting task with 2 functional items with Min-Mod A question and sentence completion cues with extra time. Towards end of session, pt initiated conversation with this clinician in regards to biographical information. Continue with plan of care.    FIM:  Comprehension Comprehension Mode: Auditory Comprehension: 4-Understands basic 75 - 89% of the time/requires cueing 10 - 24% of the time Expression Expression Mode: Verbal Expression: 3-Expresses basic 50 - 74% of the time/requires cueing 25 - 50% of the time. Needs to repeat parts of sentences. Social Interaction Social Interaction: 4-Interacts appropriately 75 - 89% of the time - Needs redirection for appropriate language or to initiate interaction. Problem Solving Problem Solving: 2-Solves basic 25 - 49% of the time - needs direction more than half the time to initiate, plan or complete simple  activities Memory Memory: 2-Recognizes or recalls 25 - 49% of the time/requires cueing 51 - 75% of the time  Pain Pain Assessment Pain Assessment: No/denies pain  Therapy/Group: Individual Therapy  Michal Strzelecki 01/22/2014, 1:09 PM

## 2014-01-22 NOTE — Progress Notes (Signed)
Physical Therapy Session Note  Patient Details  Name: [name] MRN: [id number] Date of Birth: 1927-07-06  Today's Date: 01/22/2014 Time: 1305-1405 Time Calculation (min): 60 min  Short Term Goals: Week 2:  PT Short Term Goal 1 (Week 2): Patient will perform sit>supine independently with HOB flat and no rails. PT Short Term Goal 2 (Week 2): Patient will performed bed<>w/c transfer with rolling walker and supervision. PT Short Term Goal 3 (Week 2): Patient will perform gait x75' in controlled environment with rolling walker and supervision. PT Short Term Goal 4 (Week 2): Patient will perform w/c mobility x75' in controlled environment with supervision.  Skilled Therapeutic Interventions/Progress Updates:    Pt received seated in w/c with wife present; agreeable to therapy. Session focused on initiating hands-on education/training with wife. Stand pivot from w/c<>bedside chair x2 trials with rolling walker and wife providing min A for stability/balance. Wife provided appropriate assist and cueing with bed<>chair transfer; therefore, therapist approved wife performance of basic transfers within room. RN notified and safety plan updated to reflect changes. Wife educated on importance of asking for assistance with transfers if she feels unsafe for any reason (for example, if pt is fatigued or wife needs assistance managing IV pole, etc). Wife in agreement.  Pt performed gait x135' in controlled environment with rolling walker and wife providing min A. Negotiation of 3 stairs with bilat UE support at L rail, sideways, step-to pattern, with wife providing min A. Car transfer with rolling walker and wife providing min A and verbal cueing for safe/proper technique. Wife will require reinforcement of assistance/cueing with gait, stair negotiation, and car transfer. Pt transported to room, where session ended. Performed toilet transfer with rolling walker and therapist providing min A. Upon OT arrival,  therapist departed with pt seated on toilet with OT providing supervision.  Therapy Documentation Precautions:  Precautions Precautions: Fall Precaution Comments: Sensory deficits on R side; may push to R Restrictions Weight Bearing Restrictions: No Vital Signs: Therapy Vitals Temp: 97.9 F (36.6 C) Temp src: Oral Pulse Rate: 110 Resp: 18 BP: 126/74 mmHg Patient Position, if appropriate: Sitting Oxygen Therapy SpO2: 99 % O2 Device: None (Room air) Pain: Pain Assessment Pain Assessment: No/denies pain Locomotion : Ambulation Ambulation/Gait Assistance: 4: Min assist   See FIM for current functional status  Therapy/Group: Individual Therapy  [name], Malva Cogan 01/22/2014, 5:30 PM

## 2014-01-22 NOTE — Progress Notes (Signed)
Occupational Therapy Session Note  Patient Details  Name: [name] MRN: [id number] Date of Birth: 1927-08-21  Today's Date: 01/22/2014 Time: 0906-1006 and 1405-1440 Time Calculation (min): 60 min and 35 min   Short Term Goals: Week 2:  OT Short Term Goal 1 (Week 2): Patient will complete toilet transfer with min assist OT Short Term Goal 2 (Week 2): Patient will complete toilet task with steadying assist only OT Short Term Goal 3 (Week 2): Patient will complete oral care using RUE without assist 75% of task OT Short Term Goal 4 (Week 2): Patient will complete LB dressing with steadying assist  Skilled Therapeutic Interventions/Progress Updates:    Session 1: Pt seen for ADL retraining with focus on postural control in standing, sequencing, and functional use of RUE. Pt received sitting EOB with wife present. Pt ambulated to toilet with RW and min assist demonstrating R lean requiring assist to correct. Pt required max cueing throughout session for placement of BLEs and BUEs for sit<>stand and for keeping RW closer to body. Pt required [facility] assist for holding wash cloth in R hand to wash LUE. Completed oral care in standing while weightbearing through RUE and utilizing LUE with min assist. At end of session pt left sitting in w/c with wife present and all items in reach.   Session 2: Pt seen for 1:1 skilled OT session with focus on functional transfers. Pt received sitting on toilet with wife and PT present. Pt required max assist for toilet task and max cueing from therapist to wife to have pt attempt to complete as much of task as he can. Wife assisted pt from toilet to w/c with min assist providing appropriate cueing. Practiced tub transfer with TTB. Pt completed with min assist and required assist to initiate scooting on TTB once in sitting. At end of session pt returned to room and completed stand pivot transfer to recliner chair. Pt completed all sit<>stands with min assist, except one at  supervision level.   Therapy Documentation Precautions:  Precautions Precautions: Fall Precaution Comments: Sensory deficits on R side; may push to R Restrictions Weight Bearing Restrictions: No General:   Vital Signs:   Pain: No report of pain during therapy sessions.    Other Treatments:    See FIM for current functional status  Therapy/Group: Individual Therapy  Duayne Cal 01/22/2014, 10:22 AM

## 2014-01-22 NOTE — Progress Notes (Signed)
Speech Language Pathology Daily Session Note  Patient Details  Name: [name] MRN: [id number] Date of Birth: April 13, 1927  Today's Date: 01/22/2014 Time: 1500-1540 Time Calculation (min): 40 min  Short Term Goals: Week 2: SLP Short Term Goal 1 (Week 2): Pt will consume Dys 1 textures and nectar-thick liquids, utilizing safe swallowing strategies with Mod cues SLP Short Term Goal 2 (Week 2): Pt will demonstrate effective mastication and oral clearance with dys 2 textures to assess readiness for diet advancement SLP Short Term Goal 3 (Week 2): Pt will sustain attention to basic functional task for 10 min with Mod cues SLP Short Term Goal 5 (Week 2): Pt will name common objects with 80% accuracy with Supervision cues  Skilled Therapeutic Interventions: Skilled treatment focused on cognitive-linguistic goals. SLP facilitated session with Mod verbal cues for utilization of an increased vocal intensity to increase speech intelligibility at the phrase level. Pt completed basic money management task with Max A for basic problem solving and calculations. Continue plan of care.   FIM:  Comprehension Comprehension Mode: Auditory Comprehension: 4-Understands basic 75 - 89% of the time/requires cueing 10 - 24% of the time Expression Expression Mode: Verbal Expression: 3-Expresses basic 50 - 74% of the time/requires cueing 25 - 50% of the time. Needs to repeat parts of sentences. Social Interaction Social Interaction: 4-Interacts appropriately 75 - 89% of the time - Needs redirection for appropriate language or to initiate interaction. Problem Solving Problem Solving: 2-Solves basic 25 - 49% of the time - needs direction more than half the time to initiate, plan or complete simple activities Memory Memory: 2-Recognizes or recalls 25 - 49% of the time/requires cueing 51 - 75% of the time  Pain Pain Assessment Pain Assessment: No/denies pain  Therapy/Group: Individual Therapy   Germain Osgood, M.A. CCC-SLP [phone]   Germain Osgood 01/22/2014, 4:56 PM

## 2014-01-23 ENCOUNTER — Inpatient Hospital Stay (HOSPITAL_COMMUNITY): Payer: Medicare Other | Admitting: Physical Therapy

## 2014-01-23 ENCOUNTER — Inpatient Hospital Stay (HOSPITAL_COMMUNITY): Payer: Medicare Other | Admitting: Speech Pathology

## 2014-01-23 ENCOUNTER — Encounter (HOSPITAL_COMMUNITY): Payer: Medicare Other

## 2014-01-23 ENCOUNTER — Inpatient Hospital Stay (HOSPITAL_COMMUNITY): Payer: Medicare Other

## 2014-01-23 LAB — BASIC METABOLIC PANEL
BUN: 20 mg/dL (ref 6–23)
CALCIUM: 8.9 mg/dL (ref 8.4–10.5)
CO2: 27 meq/L (ref 19–32)
CREATININE: 0.99 mg/dL (ref 0.50–1.35)
Chloride: 103 mEq/L (ref 96–112)
GFR calc Af Amer: 85 mL/min — ABNORMAL LOW (ref >90)
GFR, EST NON AFRICAN AMERICAN: 73 mL/min — AB (ref >90)
GLUCOSE: 86 mg/dL (ref 70–99)
Potassium: 4.4 mEq/L (ref 3.7–5.3)
SODIUM: 141 meq/L (ref 137–147)

## 2014-01-23 MED ORDER — SODIUM CHLORIDE 0.45 % IV SOLN
INTRAVENOUS | Status: DC
  Administered 2014-01-24 – 2014-01-28 (×5): via INTRAVENOUS
  Filled 2014-01-23 (×8): qty 1000

## 2014-01-23 NOTE — Progress Notes (Signed)
NUTRITION FOLLOW UP  DOCUMENTATION CODES  Per approved criteria   -Severe malnutrition in the context of chronic illness  -Underweight    Intervention:   1.  D/C Resource  2. Magic cup with meals and medications  3. Provide pt with additional nectar thick liquids on meal trays.   Nutrition Dx:   Increased nutrient needs, ongoing  Monitor:   1.  Food/Beverage; pt meeting >/=90% estimated needs with tolerance. 2.  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 He was admitted 12/25/2013 with altered mental status and low grade fever of 100 Fahrenheit and reported hematuria. Cranial CT scan negative and MRI of the brain showed punctate acute infarct in the high left parietal lobe as well as extensive chronic ischemic changes. He was placed on Dysphagia 1 nectar thick liquid diet per ST recommendations. Patient with difficulty with processing with easy distractibility, as well as problems with initiation. Needs tactile cues for basic tasks and mobility. On 01/05/14, he was completing his shower when he developed acute onset of right sided weakness with speech difficulty. Code stroke was initiated and he was transferred to acute services. MRI 01/06/2014 revealed Left PCA distribution infarct.  Pt's intake has improved to 75-100% of meals consistently. Pt sleeping but spoke with pt's wife who is always with him. Per wife intake good and she feels that he is tolerating his Dysphagia 2 diet better. Pt dislikes the [place]. Per wife she can get him to drink a little of them. Pt loves the magic cups, he orders for all meal trays and is also taking them with medications.  Pt required IVF's. Discussed with wife liquids he will consume (pt likes V8, buttermilk, and pre-thickened juices) and to encourage these. Pt dislikes the pre-thickened milk.   Height: Ht Readings  from Last 1 Encounters:  01/09/14 6' 2.02" (1.88 m)    Weight Status:   Wt Readings from Last 1 Encounters:  01/09/14 125 lb 3.5 oz (56.8 kg)  No new weight recorded. Pt in chair, unable to weigh pt at this time.   Re-estimated needs:  Kcal: 2000-2400  Protein: 115-130g/day  Fluid: 2-2.4L/day  Skin: intact  Diet Order: Dysphagia 2, nectar-thick Meal Completion: >75%   Intake/Output Summary (Last 24 hours) at 01/23/14 1309 Last data filed at 01/23/14 0754  Gross per 24 hour  Intake    480 ml  Output   1800 ml  Net  -1320 ml    Last BM: 1/25   Labs:   Recent Labs Lab 01/18/14 0557 01/21/14 0550 01/23/14 0616  NA 142 140 141  K 4.6 4.3 4.4  CL 105 101 103  CO2 26 28 27   BUN 23 30* 20  CREATININE 0.97 1.02 0.99  CALCIUM 8.8 9.1 8.9  GLUCOSE 86 89 86    CBG (last 3)  No results found for this basename: GLUCAP,  in the last 72 hours  Scheduled Meds: . antiseptic oral rinse  15 mL Mouth Rinse q12n4p  . cephALEXin  250 mg Oral Q8H  . chlorhexidine  15 mL Mouth Rinse BID  . diltiazem  60 mg Oral Q8H  . feeding supplement (RESOURCE BREEZE)  1 Container Oral TID BM  . rivaroxaban  15 mg Oral Q supper  . senna-docusate  2 tablet Oral BID    Continuous Infusions: . sodium chloride 75 mL/hr at 01/23/14 [phone]  [place], [place], [place] Pager [phone] After Hours Pager

## 2014-01-23 NOTE — Progress Notes (Signed)
Speech Language Pathology Daily Session Note  Patient Details  Name: [name] MRN: [id number] Date of Birth: 01-Jan-1927  Today's Date: 01/23/2014 Time: 1445-1530 Time Calculation (min): 45 min  Short Term Goals: Week 2: SLP Short Term Goal 1 (Week 2): Pt will consume Dys 1 textures and nectar-thick liquids, utilizing safe swallowing strategies with Mod cues SLP Short Term Goal 2 (Week 2): Pt will demonstrate effective mastication and oral clearance with dys 2 textures to assess readiness for diet advancement SLP Short Term Goal 3 (Week 2): Pt will sustain attention to basic functional task for 10 min with Mod cues SLP Short Term Goal 5 (Week 2): Pt will name common objects with 80% accuracy with Supervision cues  Skilled Therapeutic Interventions: Skilled treatment focused on cognitive-linguistic goals. SLP facilitated session with Mod verbal cues for utilization of an increased vocal intensity to increase speech intelligibility at the phrase level. Pt required Min question and visual cues for naming of functional items and Mod A verbal cues for functional problem solving with category exclusion task.  Pt also required Mod-Max verbal, question and visual cues for sequencing 4 step picture cards. Pt's son was present and demonstrates appropriate cueing. Continue plan of care.    FIM:  Comprehension Comprehension Mode: Auditory Comprehension: 4-Understands basic 75 - 89% of the time/requires cueing 10 - 24% of the time Expression Expression Mode: Verbal Expression: 3-Expresses basic 50 - 74% of the time/requires cueing 25 - 50% of the time. Needs to repeat parts of sentences. Social Interaction Social Interaction: 4-Interacts appropriately 75 - 89% of the time - Needs redirection for appropriate language or to initiate interaction. Problem Solving Problem Solving: 2-Solves basic 25 - 49% of the time - needs direction more than half the time to initiate, plan or complete simple  activities Memory Memory: 2-Recognizes or recalls 25 - 49% of the time/requires cueing 51 - 75% of the time  Pain Pain Assessment Pain Assessment: No/denies pain  Therapy/Group: Individual Therapy  Dina Warbington 01/23/2014, 4:31 PM

## 2014-01-23 NOTE — Progress Notes (Signed)
Occupational Therapy Session Note  Patient Details  Name: [name] MRN: [id number] Date of Birth: Jun 01, 1927  Today's Date: 01/23/2014 Time: 0900-0951 Time Calculation (min): 51 min   Skilled Therapeutic Interventions/Progress Updates:    Patient seen this am for OT bathing and dressing.  Wife present for this session, and very participative, willing to provide hands on physical and personal care.  Wife aided patient with hygiene after toileting, walked with patient into bathroom, and assisted with various aspects of bathing and dressing.  Wife very patient and encouraging.  Patient required cueing to utlize vision to enhance right upper extremity function, e.g. Make sure your thumb is inside your sock before pulling up.  Patient transitioned from sit to stand multiple times, and did not require greater than min assist, although needed consistent cueing for steps of process, and hand placement.  Wife indicates that they will need a tub transfer bench for discharge.  She indicated that primary OT demonstrated this bench yesterday, and this was determined to be the safest option for shower at home.    Therapy Documentation Precautions:  Precautions Precautions: Fall Precaution Comments: Sensory deficits on R side; may push to R Restrictions Weight Bearing Restrictions: No    Pain: Pain Assessment Pain Assessment: No/denies pain Pain Score: 0-No pain       See FIM for current functional status  Therapy/Group: Individual Therapy  Mariah Milling 01/23/2014, 9:59 AM

## 2014-01-23 NOTE — Progress Notes (Signed)
Physical Therapy Note  Patient Details  Name: Delvin Hedeen MRN: [id number] Date of Birth: [birth year] Today's Date: 01/23/2014  Time: 1030-1115 45 minutes  1:1 No c/o pain, pt c/o fatigue but agreeable to PT.  Gait with RW with min A due to R lean 150' in home and controlled environments.  Gait on carpet with cues for lifting R LE, obstacle negotiation with good safety awareness, min A for balance with tight turns.  Side stepping with min A for balance.  Gait without AD with focus on wt shifting and balance with min A, 2 x standing rest breaks due to fatigue, cues for lifting R LE.  Standing balance with golf putting activity with focus on standing balance and wt shifts.  Pt able to putt with close supervision with min A for R UE placement.   Carmichael Burdette 01/23/2014, 11:14 AM

## 2014-01-23 NOTE — Progress Notes (Addendum)
Physical Therapy Weekly Progress Note  Patient Details  Name: [name] MRN: [id number] Date of Birth: [birth year]  Today's Date: 01/23/2014 Time: 0800-0859 Time Calculation (min): 59 min  Patient has met 2 of 4 short term goals.  Patient demonstrates increased independence with bed mobility and w/c mobility. Pt continues to require min guard-min A with functional transfers and gait secondary to tendency toward trunk lateral lean to R, narrow BOS, and inconsistent pt carryover of safe transfer technique.  Patient continues to demonstrate the following deficits: R hemiparesis, postural/gait instability, endurance deficit, decreased selective attention during functional mobility, and therefore will continue to benefit from skilled PT intervention to enhance overall performance with activity tolerance, balance, postural control, ability to compensate for deficits, functional use of  right upper extremity and right lower extremity, attention, awareness and coordination.  Patient progressing toward long term goals..Continue plan of care.  PT Short Term Goals Week 2:  PT Short Term Goal 1 (Week 2): Patient will perform sit>supine independently with HOB flat and no rails. PT Short Term Goal 1 - Progress (Week 2): Met PT Short Term Goal 2 (Week 2): Patient will performed bed<>w/c transfer with rolling walker and supervision. PT Short Term Goal 2 - Progress (Week 2): Progressing toward goal PT Short Term Goal 3 (Week 2): Patient will perform gait x75' in controlled environment with rolling walker and supervision. PT Short Term Goal 3 - Progress (Week 2): Progressing toward goal PT Short Term Goal 4 (Week 2): Patient will perform w/c mobility x75' in controlled environment with supervision. PT Short Term Goal 4 - Progress (Week 2): Met Week 3:  PT Short Term Goal 1 (Week 3): STG's=LTG's secondary to ELOS  Skilled Therapeutic Interventions/Progress Updates:    Pt received seated EOB accompanied by  wife; agreeable to therapy. Session focused on pt/family education (focus on D/C planning), increasing gait stability. Patient, wife, and therapist engaged in discussion concerning discharge planning with focus on equipment, wife's perceived preparedness to provide physical assistance. Wife expresses not feeling safe/comfortable providing hands-on assist with gait and stair negotiation at this time. Pt participated in conversation while eating breakfast, independently sitting EOB with bilat LE's supported on floor, no UE support; no LOB.   Pt independently performed sit<>supine with HOB flat, no rails. Toilet transfer with rolling walker, min A, grab bars. Performed gait x135' with close supervision during linear gait, min guard during turning secondary to pt tendency toward R trunk lean (with L turns>R turns). Min verbal cueing provided during gait trial to alert pt to onset of R-sided trunk lean (onset with gait >80') with effective return demonstration of midline posture. Mod verbal cueing to widen BOS and decrease gait velocity during turning. W/c<>furniture transfer with min A and mod verbal cueing for safe body position (proximity to chair prior to sitting) and safe hand placement. Therapist departed with pt seated in w/c accompanied by wife and OT, present for B&D.   Therapy Documentation Precautions:  Precautions Precautions: Fall Precaution Comments: Sensory deficits on R side; may push to R Restrictions Weight Bearing Restrictions: No Vital Signs: Therapy Vitals Temp: 97.6 F (36.4 C) Temp src: Oral Pulse Rate: 110 Resp: 18 BP: 109/75 mmHg Patient Position, if appropriate: Lying Oxygen Therapy SpO2: 97 % O2 Device: None (Room air) Pain: Pain Assessment Pain Assessment: No/denies pain Pain Score: 0-No pain  See FIM for current functional status  Therapy/Group: Individual Therapy  [name], [name] 01/23/2014, 6:00 PM

## 2014-01-23 NOTE — Progress Notes (Signed)
80 y.o. 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 He was admitted 12/25/2013 with altered mental status and low grade fever of 100 Fahrenheit and reported hematuria. Cranial CT scan negative and MRI of the brain showed punctate acute infarct in the high left parietal lobe as well as extensive chronic ischemic changes. Patient's Xarelto held for a short time secondary to hematuria. He was started on IV rocephin due to SIRS as well as Tamiflu as he tested positive for type A influenza. He was placed on Dysphagia 1 nectar thick liquid diet per ST recommendations. Patient with difficulty with processing with easy distractibility, as well as problems with initiation. Needs tactile cues for basic tasks and mobility. Mentation improving and neurology recommends resuming Xarelto for embolic stroke due to A Fib  On 01/05/14, he was completing his shower when he developed acute onset of right sided weakness with speech difficulty. Code stroke was initiated and he was transferred to acute services. MRI 01/06/2014 revealed Left PCA distribution infarct  Subjective/Complaints: Good urine output last noc has condom cath, IV fluids for hydration Review of Systems - difficult to obtain secondary to aphasia and reduced attn Objective: Vital Signs: Blood pressure 109/75, pulse 110, temperature 97.6 F (36.4 C), temperature source Oral, resp. rate 18, height 6' 2.02" (1.88 m), weight 56.8 kg (125 lb 3.5 oz), SpO2 97.00%. No results found. Results for orders placed during the hospital encounter of 01/09/14 (from the past 72 hour(s))  BASIC METABOLIC PANEL     Status: Abnormal   Collection Time    01/21/14  5:50 AM      Result Value Range   Sodium 140  137 - 147 mEq/L   Potassium 4.3  3.7 - 5.3 mEq/L   Chloride 101  96 - 112 mEq/L   CO2 28  19 - 32 mEq/L   Glucose, Bld 89  70 - 99  mg/dL   BUN 30 (*) 6 - 23 mg/dL   Creatinine, Ser 1.02  0.50 - 1.35 mg/dL   Calcium 9.1  8.4 - 10.5 mg/dL   GFR calc non Af Amer 65 (*) >90 mL/min   GFR calc Af Amer 76 (*) >90 mL/min   Comment: (NOTE)     The eGFR has been calculated using the CKD EPI equation.     This calculation has not been validated in all clinical situations.     eGFR's persistently <90 mL/min signify possible Chronic Kidney     Disease.      Constitutional: He appears well-developed and well-nourished.  HENT:  Head: Normocephalic and atraumatic.  Eyes: Conjunctivae and EOM are normal. Pupils are equal, round, and reactive to light.  Neck: Normal range of motion. Neck supple.  Cardiovascular: Normal rate and normal heart sounds. An irregularly irregular rhythm present.  Respiratory: Effort normal. No respiratory distress. He has decreased breath sounds in the right lower field and the left lower field.  Neurological: He is fairly alert. Delayed processing, poor awareness. He exhibits normal muscle tone.  Motor strength is 3 minus right deltoid, bicep, tricep, grip, right ankle dorsiflexor 4 minus right hip flexor knee extensor 5/5 left deltoid, bicep, tricep, grip, hip flexors, knee extensors, ankle dorsiflexor plantar flexor   Assessment/Plan: 1. Functional deficits secondary to L PCA embolic infarct 07/02/2375 superimposed on L high parietal infarct onset 12/25/2013 which require 3+ hours per day of  interdisciplinary therapy in a comprehensive inpatient rehab setting. Physiatrist is providing close team supervision and 24 hour management of active medical problems listed below. Physiatrist and rehab team continue to assess barriers to discharge/monitor patient progress toward functional and medical goals. Team conference today please see physician documentation under team conference tab, met with team face-to-face to discuss problems,progress, and goals. Formulized individual treatment plan based on medical  history, underlying problem and comorbidities. FIM: FIM - Bathing Bathing Steps Patient Completed: Chest;Right Arm;Abdomen;Left upper leg;Front perineal area;Left lower leg (including foot);Right upper leg Bathing: 3: Mod-Patient completes 5-7 53f 10 parts or 50-74%  FIM - Upper Body Dressing/Undressing Upper body dressing/undressing steps patient completed: Thread/unthread left sleeve of pullover shirt/dress;Put head through opening of pull over shirt/dress Upper body dressing/undressing: 3: Mod-Patient completed 50-74% of tasks FIM - Lower Body Dressing/Undressing Lower body dressing/undressing steps patient completed: Thread/unthread left underwear leg;Thread/unthread right pants leg;Thread/unthread left pants leg;Don/Doff left sock Lower body dressing/undressing: 3: Mod-Patient completed 50-74% of tasks  FIM - Toileting Toileting steps completed by patient: Adjust clothing prior to toileting Toileting Assistive Devices: Grab bar or rail for support Toileting: 2: Max-Patient completed 1 of 3 steps  FIM - Radio producer Devices: Elevated toilet seat;Grab bars;Walker Toilet Transfers: 4-To toilet/BSC: Min A (steadying Pt. > 75%);4-From toilet/BSC: Min A (steadying Pt. > 75%)  FIM - Control and instrumentation engineer Devices: Walker;Arm rests Bed/Chair Transfer: 4: Bed > Chair or W/C: Min A (steadying Pt. > 75%);4: Chair or W/C > Bed: Min A (steadying Pt. > 75%)  FIM - Locomotion: Wheelchair Distance: 120 Locomotion: Wheelchair: 1: Total Assistance/staff pushes wheelchair (Pt<25%) FIM - Locomotion: Ambulation Locomotion: Ambulation Assistive Devices: Administrator Ambulation/Gait Assistance: 4: Min assist Locomotion: Ambulation: 2: Travels 50 - 149 ft with minimal assistance (Pt.>75%)  Comprehension Comprehension Mode: Auditory Comprehension: 4-Understands basic 75 - 89% of the time/requires cueing 10 - 24% of the  time  Expression Expression Mode: Verbal Expression: 3-Expresses basic 50 - 74% of the time/requires cueing 25 - 50% of the time. Needs to repeat parts of sentences.  Social Interaction Social Interaction: 4-Interacts appropriately 75 - 89% of the time - Needs redirection for appropriate language or to initiate interaction.  Problem Solving Problem Solving: 3-Solves basic 50 - 74% of the time/requires cueing 25 - 49% of the time  Memory Memory: 2-Recognizes or recalls 25 - 49% of the time/requires cueing 51 - 75% of the time   Medical Problem List and Plan:  1. DVT Prophylaxis/Anticoagulation: Pharmaceutical: Xarelto  2. Pain Management: tylenol  3. Mood: monitor for depression, encourage interaction  4. Neuropsych: This patient is not capable of making decisions on his own behalf.  5. Afib Xarelto  6. Resp- recent Influenza and hx COPD- IS,monitor  7.  Dysphagia improving. BUN/CReat rising Off IVF, will resume .45 NS 8. Urinary frequency: he's off IVF. Continues with frequency and incontinence  -ua positive  -culture +Kleb P  - sens to keflex will tx x 7d LOS (Days) 14 A FACE TO FACE EVALUATION WAS PERFORMED  KIRSTEINS,ANDREW E 01/23/2014, 7:08 AM

## 2014-01-23 NOTE — Progress Notes (Signed)
Patient has been off digoxin since transfer to acute. Tachycardia note this week--likely related to dehydration and UTI. Discussed with cardiology who recommends documenting heart rate with activity. If HR>110 with activity to resume low dose digoxin. Will have PT document tomorrow.

## 2014-01-23 NOTE — Progress Notes (Signed)
ANTICOAGULATION CONSULT NOTE - Follow Up Consult  Pharmacy Consult for xarelto  Indication: atrial fibrillation  Allergies  Allergen Reactions  . Diazepam     REACTION: agitation  . Ezetimibe-Simvastatin     unknown  . Morphine Other (See Comments)    Hallucinations.  . Sulfonamide Derivatives Hives    Patient Measurements: Height: 6' 2.02" (188 cm) Weight: 125 lb 3.5 oz (56.8 kg) IBW/kg (Calculated) : 82.24  Vital Signs: Temp: 97.6 F (36.4 C) (01/28 0518) Temp src: Oral (01/28 0518) BP: 109/75 mmHg (01/28 0518) Pulse Rate: 110 (01/28 0518)  Labs:  Recent Labs  01/21/14 0550 01/23/14 0616  CREATININE 1.02 0.99    Estimated Creatinine Clearance: 44.6 ml/min (by C-G formula based on Cr of 0.99).  Assessment: 24 yof continues on xarelto for afib. SCr = 0.99, CrCl ~ 45 ml/min.  Current dose of Xarelto is 15 mg daily which is appropriate for renal fxn. No overt bleeding noted. Recent hx of small amount of hematuria without clots on 1/13 (likely related to surgery for bladder cancer). H/H 10.3/30.7, plts 253 as of 1/23. No bleeding reported currently.  Goal of Therapy:  Therapeutic anticoagulation   Plan:  1. Continue xarelto 15mg  PO daily 2. Monitor for signs and symptoms of bleeding  Nicole Cella, RPh Clinical Pharmacist Pager: [phone] 01/23/2014,1:47 PM

## 2014-01-24 ENCOUNTER — Inpatient Hospital Stay (HOSPITAL_COMMUNITY): Payer: Medicare Other | Admitting: Physical Therapy

## 2014-01-24 ENCOUNTER — Inpatient Hospital Stay (HOSPITAL_COMMUNITY): Payer: Medicare Other

## 2014-01-24 NOTE — Progress Notes (Signed)
Occupational Therapy Session Note  Patient Details  Name: [name] MRN: [id number] Date of Birth: [birth year]  Today's Date: 01/24/2014 Time: 0830-0930 Time Calculation (min): 60 min  Short Term Goals: Week 2:  OT Short Term Goal 1 (Week 2): Patient will complete toilet transfer with min assist OT Short Term Goal 2 (Week 2): Patient will complete toilet task with steadying assist only OT Short Term Goal 3 (Week 2): Patient will complete oral care using RUE without assist 75% of task OT Short Term Goal 4 (Week 2): Patient will complete LB dressing with steadying assist  Skilled Therapeutic Interventions/Progress Updates:    Pt seen for ADL retraining with focus on functional transfers, use of RUE, and standing balance. Pt received sitting EOB and ambulated to bathroom with min assist HHA. Wife eager to assist with hygiene during toilet task. Completed bathing with min assist for standing balance and for buttocks hygiene. Pt required min cueing during bathing and dressing to visually attend to R hand when using it functionally. Cues also provided to slow movements. At end of session pt left sitting in w/c with all needs in reach.   Therapy Documentation Precautions:  Precautions Precautions: Fall Precaution Comments: Sensory deficits on R side; may push to R Restrictions Weight Bearing Restrictions: No General:   Vital Signs:   Pain: Pt with no report of pain during therapy session.   See FIM for current functional status  Therapy/Group: Individual Therapy  Duayne Cal 01/24/2014, 12:19 PM

## 2014-01-24 NOTE — Progress Notes (Signed)
Physical Therapy Note  Patient Details  Name: [name] MRN: [id number] Date of Birth: [birth year] Today's Date: 01/24/2014  Time 1: 1000-1055 55 minutes  1:1 No c/o pain.  Pt/wife safely demo'd gait in home and controlled environment as well as bathroom transfers at min A level.  Standing balance exercises with focus on equal wt bearing B LEs, finding center and decreasing R and posterior lean in standing.  Pt continues to require min facilitation for wt shifts and balance.  Standing stepping over obstacles forward and laterally with min/mod A for balance and wt shifts.  Standing squat and reach activity with R hand grasping and pinning clothespins with increased time and occasional assist for R grip.  Pt with improved activity tolerance, continues to be frustrated by difficulty with R hand.  Time 2: 1300-1325 25 minutes  1:1 No c/o pain.  Treatment session focused on gait training home and controlled environments while monitoring HR per MD new order.  Pt gait in controlled and home environment with RW with min A, cues for lifting R LE on carpet.  Obstacle negotiation and sidestepping with RW with min A, cues for safety and posture.   HR seated :73bpm HR after controlled environment gait: 102 bpm HR after home environment gait: 126 bpm   [name] 01/24/2014, 10:54 AM

## 2014-01-24 NOTE — Progress Notes (Signed)
80 y.o. right handed male with history of COPD, prior CVA 08/2012 with right sided weakness and dysphagia ( CIR stay) , A fib--Xarelto and bladder cancer with ongoing treatment at [facility]. He was admitted 12/25/2013 with altered mental status and low grade fever of 100 Fahrenheit and reported hematuria. Cranial CT scan negative and MRI of the brain showed punctate acute infarct in the high left parietal lobe as well as extensive chronic ischemic changes. Patient's Xarelto held for a short time secondary to hematuria. He was started on IV rocephin due to SIRS as well as Tamiflu as he tested positive for type A influenza. He was placed on Dysphagia 1 nectar thick liquid diet per ST recommendations. Patient with difficulty with processing with easy distractibility, as well as problems with initiation. Needs tactile cues for basic tasks and mobility. Mentation improving and neurology recommends resuming Xarelto for embolic stroke due to A Fib  On 01/05/14, he was completing his shower when he developed acute onset of right sided weakness with speech difficulty. Code stroke was initiated and he was transferred to acute services. MRI 01/06/2014 revealed Left PCA distribution infarct  Subjective  Off IVF, less urine last noc Review of Systems - difficult to obtain secondary to aphasia and reduced attn Objective: Vital Signs: Blood pressure 104/72, pulse 139, temperature 98 F (36.7 C), temperature source Oral, resp. rate 18, height 6' 2.02" (1.88 m), weight 56.8 kg (125 lb 3.5 oz), SpO2 98.00%. No results found. Results for orders placed during the hospital encounter of 01/09/14 (from the past 72 hour(s))  BASIC METABOLIC PANEL     Status: Abnormal   Collection Time    01/23/14  6:16 AM      Result Value Range   Sodium 141  137 - 147 mEq/L   Potassium 4.4  3.7 - 5.3 mEq/L   Chloride 103  96 - 112 mEq/L   CO2 27  19 - 32 mEq/L   Glucose, Bld 86  70 - 99 mg/dL   BUN 20  6 - 23 mg/dL   Creatinine, Ser 0.99   0.50 - 1.35 mg/dL   Calcium 8.9  8.4 - 10.5 mg/dL   GFR calc non Af Amer 73 (*) >90 mL/min   GFR calc Af Amer 85 (*) >90 mL/min   Comment: (NOTE)     The eGFR has been calculated using the CKD EPI equation.     This calculation has not been validated in all clinical situations.     eGFR's persistently <90 mL/min signify possible Chronic Kidney     Disease.      Constitutional: He appears well-developed and well-nourished.  HENT:  Head: Normocephalic and atraumatic.  Eyes: Conjunctivae and EOM are normal. Pupils are equal, round, and reactive to light.  Neck: Normal range of motion. Neck supple.  Cardiovascular: Normal rate and normal heart sounds. An irregularly irregular rhythm present.  Respiratory: Effort normal. No respiratory distress. He has decreased breath sounds in the right lower field and the left lower field.  Neurological: He is fairly alert. Delayed processing, poor awareness. He exhibits normal muscle tone.  Motor strength is 3 minus right deltoid, bicep, tricep, grip, right ankle dorsiflexor 4 minus right hip flexor knee extensor Ataxia on Right side with reduced sensory 5/5 left deltoid, bicep, tricep, grip, hip flexors, knee extensors, ankle dorsiflexor plantar flexor   Assessment/Plan: 1. Functional deficits secondary to L PCA embolic infarct 0/09/9322 superimposed on L high parietal infarct onset 12/25/2013 which require 3+ hours per  day of interdisciplinary therapy in a comprehensive inpatient rehab setting. Physiatrist is providing close team supervision and 24 hour management of active medical problems listed below. Physiatrist and rehab team continue to assess barriers to discharge/monitor patient progress toward functional and medical goals.  FIM: FIM - Bathing Bathing Steps Patient Completed: Chest;Right Arm;Abdomen;Front perineal area;Left upper leg;Right upper leg Bathing: 3: Mod-Patient completes 5-7 49f 10 parts or 50-74%  FIM - Upper Body  Dressing/Undressing Upper body dressing/undressing steps patient completed: Thread/unthread left sleeve of pullover shirt/dress;Put head through opening of pull over shirt/dress Upper body dressing/undressing: 3: Mod-Patient completed 50-74% of tasks FIM - Lower Body Dressing/Undressing Lower body dressing/undressing steps patient completed: Thread/unthread right underwear leg;Thread/unthread left underwear leg;Thread/unthread right pants leg;Thread/unthread left pants leg;Don/Doff left sock Lower body dressing/undressing: 3: Mod-Patient completed 50-74% of tasks  FIM - Toileting Toileting steps completed by patient: Adjust clothing prior to toileting Toileting Assistive Devices: Grab bar or rail for support Toileting: 2: Max-Patient completed 1 of 3 steps  FIM - Radio producer Devices: Grab bars;Walker Toilet Transfers: 4-To toilet/BSC: Min A (steadying Pt. > 75%);4-From toilet/BSC: Min A (steadying Pt. > 75%)  FIM - Control and instrumentation engineer Devices: Walker;Arm rests Bed/Chair Transfer: 7: Sit > Supine: No assist;7: Supine > Sit: No assist;4: Bed > Chair or W/C: Min A (steadying Pt. > 75%);4: Chair or W/C > Bed: Min A (steadying Pt. > 75%)  FIM - Locomotion: Wheelchair Distance: 120 Locomotion: Wheelchair: 1: Total Assistance/staff pushes wheelchair (Pt<25%) FIM - Locomotion: Ambulation Locomotion: Ambulation Assistive Devices: Administrator Ambulation/Gait Assistance: 4: Min guard;5: Supervision Locomotion: Ambulation: 2: Travels 50 - 149 ft with minimal assistance (Pt.>75%)  Comprehension Comprehension Mode: Auditory Comprehension: 4-Understands basic 75 - 89% of the time/requires cueing 10 - 24% of the time  Expression Expression Mode: Verbal Expression: 3-Expresses basic 50 - 74% of the time/requires cueing 25 - 50% of the time. Needs to repeat parts of sentences.  Social Interaction Social Interaction: 4-Interacts  appropriately 75 - 89% of the time - Needs redirection for appropriate language or to initiate interaction.  Problem Solving Problem Solving: 2-Solves basic 25 - 49% of the time - needs direction more than half the time to initiate, plan or complete simple activities  Memory Memory: 2-Recognizes or recalls 25 - 49% of the time/requires cueing 51 - 75% of the time   Medical Problem List and Plan:  1. DVT Prophylaxis/Anticoagulation: Pharmaceutical: Xarelto  2. Pain Management: tylenol  3. Mood: monitor for depression, encourage interaction  4. Neuropsych: This patient is not capable of making decisions on his own behalf.  5. Afib Xarelto  6. Resp- recent Influenza and hx COPD- IS,monitor  7.  Dysphagia improving. BUN/CReat rising Off IVF, will resume .45 NS 8. Urinary frequency:UTI on KeflexLOS (Days) 15 A FACE TO FACE EVALUATION WAS PERFORMED  KIRSTEINS,ANDREW E 01/24/2014, 6:44 AM

## 2014-01-24 NOTE — Progress Notes (Signed)
Speech Language Pathology Weekly Progress & Daily Session Notes  Patient Details  Name: [name] MRN: [id number] Date of Birth: [birth year]  Today's Date: 01/24/2014 Time: 1355-1445 Time Calculation (min): 50 min  Short Term Goals: Week 2: SLP Short Term Goal 1 (Week 2): Pt will consume Dys 1 textures and nectar-thick liquids, utilizing safe swallowing strategies with Mod cues SLP Short Term Goal 1 - Progress (Week 2): Met SLP Short Term Goal 2 (Week 2): Pt will demonstrate effective mastication and oral clearance with dys 2 textures to assess readiness for diet advancement SLP Short Term Goal 2 - Progress (Week 2): Met SLP Short Term Goal 3 (Week 2): Pt will sustain attention to basic functional task for 10 min with Mod cues SLP Short Term Goal 3 - Progress (Week 2): Met SLP Short Term Goal 5 (Week 2): Pt will name common objects with 80% accuracy with Supervision cues SLP Short Term Goal 5 - Progress (Week 2): Met  New Short Term Goals: Week 3: SLP Short Term Goal 1 (Week 3): Pt will consume Dys 2 textures and nectar-thick liquids, utilizing safe swallowing strategies with Min cues SLP Short Term Goal 2 (Week 3): Pt will demonstrate effective mastication and oral clearance with dys 3 textures to assess readiness for diet advancement SLP Short Term Goal 3 (Week 3): Pt will demonstrate basic problem solving during functional task with Mod cues SLP Short Term Goal 4 (Week 3): Pt will utilize speech intelligibility strategies in conversation with Min cues  Weekly Progress Updates: Pt has met 4 out of 4 STGs during this reporting period. He is tolerating advanced Dys 2 textures and nectar thick liquids, and is now trialing thin liquids. He is able to sustain his attention to functional tasks with supervision, and requires Mod-Max cues for basic problem solving. He is using his speech intelligibility strategies with Mod cues. Education with patient and wife is ongoing. Pt will benefit from  continued SLP services to maximize functional communication, swallowing safety, and functional independence to reduce caregiver burden prior to discharge home with family.   SLP Intensity: Minumum of 1-2 x/day, 30 to 90 minutes SLP Frequency: 5 out of 7 days SLP Duration/Estimated Length of Stay: 21-25 days SLP Treatment/Interventions: Cognitive remediation/compensation;Cueing hierarchy;Dysphagia/aspiration precaution training;Environmental controls;Functional tasks;Internal/external aids;Patient/family education;Speech/Language facilitation;Oral motor exercises  Daily Session Skilled Therapeutic Intervention: Treatment focused on swallowing and cognitive-linguistic goals. SLP facilitated session with Mod cues for use of speech intelligibility strategies. Pt consumed ~3-4 ounces of thin liquid with cough observed x3, with SLP providing Mod cues for small sips. Recommend to continue trials with SLP only at this time. SLP observed pt consuming medication whole with puree with no overt s/s of aspiration observed and with adequate clearance. Pt completed categorical exclusion task with Mod cues for problem solving/reasoning and Min cues for word-finding errors. He participated in functional, abstract conversation regarding healthy lifestyles, medications, and stroke risk factors without word-finding difficulty noted. He sustained attention throughout all tasks with supervision.  FIM:  Comprehension Comprehension Mode: Auditory Comprehension: 4-Understands basic 75 - 89% of the time/requires cueing 10 - 24% of the time Expression Expression Mode: Verbal Expression: 3-Expresses basic 50 - 74% of the time/requires cueing 25 - 50% of the time. Needs to repeat parts of sentences. Social Interaction Social Interaction: 5-Interacts appropriately 90% of the time - Needs monitoring or encouragement for participation or interaction. Problem Solving Problem Solving: 2-Solves basic 25 - 49% of the time - needs  direction more than half the time  to initiate, plan or complete simple activities Memory Memory: 2-Recognizes or recalls 25 - 49% of the time/requires cueing 51 - 75% of the time FIM - Eating Eating Activity: 5: Set-up assist for open containers;5: Needs verbal cues/supervision General    Pain Pain Assessment Pain Assessment: No/denies pain  Therapy/Group: Individual Therapy   Germain Osgood, M.A. CCC-SLP [phone]   Germain Osgood 01/24/2014, 3:47 PM

## 2014-01-25 ENCOUNTER — Inpatient Hospital Stay (HOSPITAL_COMMUNITY): Payer: Medicare Other | Admitting: Physical Therapy

## 2014-01-25 ENCOUNTER — Inpatient Hospital Stay (HOSPITAL_COMMUNITY): Payer: Medicare Other

## 2014-01-25 DIAGNOSIS — G8110 Spastic hemiplegia affecting unspecified side: Secondary | ICD-10-CM

## 2014-01-25 DIAGNOSIS — I6340 Cerebral infarction due to embolism of unspecified cerebral artery: Secondary | ICD-10-CM

## 2014-01-25 DIAGNOSIS — R5381 Other malaise: Secondary | ICD-10-CM

## 2014-01-25 DIAGNOSIS — I69920 Aphasia following unspecified cerebrovascular disease: Secondary | ICD-10-CM

## 2014-01-25 MED ORDER — DIGOXIN 0.0625 MG HALF TABLET
0.0625 mg | ORAL_TABLET | Freq: Every day | ORAL | Status: DC
  Administered 2014-01-25 – 2014-01-30 (×6): 0.0625 mg via ORAL
  Filled 2014-01-25 (×8): qty 1

## 2014-01-25 NOTE — Progress Notes (Signed)
Social Work Patient ID: [name], male   DOB: [birth year], 80 y.o.   MRN: [id number]  CSW met with pt and his son and later his wife to discuss team conference.  Explained that team feels pt is doing well with ambulation and that he should reach his goals by 01-31-14.  Pt's wife is comfortable with that d/c date is glad pt will be receiving therapy for another week, as this will only make him stronger and thus easier for her to provide the minimal physical assistance he will require.  Told pt/family that we need to continue to encourage intake and family expressed the importance of this.  Discussed equipment with wife and she wants to purchase a transport chair and CSW will check prices for her at various medical supply stores.  Pt will also need a tub transfer bench and rolling walker.  Pt already has a handicap placard for the car.  Pt's wife prefers that pt return to [facility] for follow up therapies, as she believes they can do more for pt there than at home and pt has been there in the past.  CSW to arrange the above and will continue to support the pt/family.

## 2014-01-25 NOTE — Progress Notes (Signed)
Occupational Therapy Weekly Progress Note  Patient Details  Name: [name] MRN: [id number] Date of Birth: 1927-07-21  Today's Date: 01/25/2014 Time: 8088-1103 Time Calculation (min): 65 min  Patient has met 2 of 4 short term goals.  Patient demonstrating increased functional use of RUE during self-care tasks as he has progressed to completing 80% of oral with RUE and min assist for dressing. Patient continues to require min assist for functional transfers and an occasional supervision. Patient's wife has been actively participating in therapy sessions and is providing hands on assist with toilet transfers and toilet task.   Patient continues to demonstrate the following deficits: R hemiplegia, decreased postural control in standing, decreased activity tolerance, decreased strength, decreased coordination, decreased standing balance and therefore will continue to benefit from skilled OT intervention to enhance overall performance with BADLs, activity tolerance, dynamic standing balance, strengthening.  Patient progressing toward long term goals..  Continue plan of care.  OT Short Term Goals Week 2:  OT Short Term Goal 1 (Week 2): Patient will complete toilet transfer with min assist OT Short Term Goal 1 - Progress (Week 2): Met OT Short Term Goal 2 (Week 2): Patient will complete toilet task with steadying assist only OT Short Term Goal 2 - Progress (Week 2): Not met OT Short Term Goal 3 (Week 2): Patient will complete oral care using RUE without assist 75% of task OT Short Term Goal 3 - Progress (Week 2): Met OT Short Term Goal 4 (Week 2): Patient will complete LB dressing with steadying assist OT Short Term Goal 4 - Progress (Week 2): Not met Week 3:  OT Short Term Goal 1 (Week 3): Focus on LTGs  Skilled Therapeutic Interventions/Progress Updates:    Pt seen for ADL retraining with focus on functional transfers, functional use of RUE, and standing balance. Pt received sitting EOB  finishing breakfast with wife. Ambulated with RW to toilet min assist and min assist and cueing for balance for clothing management up/down. Wife eager to assist with hygiene. Pt improving functional use of RUE during self-care tasks requiring min-mod cueing for slowing movements and visually attending to R hand when reaching or sustaining grip. Pt completed oral care from w/c level using R hand 80% of task as he required assist to turn toothbrush over when transitioning to brushing top teeth. At end of session pt left sitting in w/c with all needs in reach and wife present.   Therapy Documentation Precautions:  Precautions Precautions: Fall Precaution Comments: Lateral trunk lean to R; sensory deficits on R side Restrictions Weight Bearing Restrictions: No General:   Vital Signs:   Pain: Pain Assessment Pain Assessment: No/denies pain Pain Score: 0-No pain    Other Treatments:    See FIM for current functional status  Therapy/Group: Individual Therapy  Duayne Cal 01/25/2014, 12:51 PM

## 2014-01-25 NOTE — Progress Notes (Signed)
80 y.o. 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 He was admitted 12/25/2013 with altered mental status and low grade fever of 100 Fahrenheit and reported hematuria. Cranial CT scan negative and MRI of the brain showed punctate acute infarct in the high left parietal lobe as well as extensive chronic ischemic changes. Patient's Xarelto held for a short time secondary to hematuria. He was started on IV rocephin due to SIRS as well as Tamiflu as he tested positive for type A influenza. He was placed on Dysphagia 1 nectar thick liquid diet per ST recommendations. Patient with difficulty with processing with easy distractibility, as well as problems with initiation. Needs tactile cues for basic tasks and mobility. Mentation improving and neurology recommends resuming Xarelto for embolic stroke due to A Fib  On 01/05/14, he was completing his shower when he developed acute onset of right sided weakness with speech difficulty. Code stroke was initiated and he was transferred to acute services. MRI 01/06/2014 revealed Left PCA distribution infarct  Subjective  Intake 75-80% Review of Systems - difficult to obtain secondary to aphasia and reduced attn Objective: Vital Signs: Blood pressure 114/76, pulse 136, temperature 98 F (36.7 C), temperature source Oral, resp. rate 16, height 6' 2.02" (1.88 m), weight 56.8 kg (125 lb 3.5 oz), SpO2 96.00%. No results found. Results for orders placed during the hospital encounter of 01/09/14 (from the past 72 hour(s))  BASIC METABOLIC PANEL     Status: Abnormal   Collection Time    01/23/14  6:16 AM      Result Value Range   Sodium 141  137 - 147 mEq/L   Potassium 4.4  3.7 - 5.3 mEq/L   Chloride 103  96 - 112 mEq/L   CO2 27  19 - 32 mEq/L   Glucose, Bld 86  70 - 99 mg/dL   BUN 20  6 - 23 mg/dL   Creatinine, Ser 0.99  0.50 - 1.35  mg/dL   Calcium 8.9  8.4 - 10.5 mg/dL   GFR calc non Af Amer 73 (*) >90 mL/min   GFR calc Af Amer 85 (*) >90 mL/min   Comment: (NOTE)     The eGFR has been calculated using the CKD EPI equation.     This calculation has not been validated in all clinical situations.     eGFR's persistently <90 mL/min signify possible Chronic Kidney     Disease.      Constitutional: He appears well-developed and well-nourished.  HENT:  Head: Normocephalic and atraumatic.  Eyes: Conjunctivae and EOM are normal. Pupils are equal, round, and reactive to light.  Neck: Normal range of motion. Neck supple.  Cardiovascular: Normal rate and normal heart sounds. An irregularly irregular rhythm present.  Respiratory: Effort normal. No respiratory distress. He has decreased breath sounds in the right lower field and the left lower field.  Neurological: He is fairly alert. Delayed processing, poor awareness. He exhibits normal muscle tone.  Motor strength is 3 minus right deltoid, bicep, tricep, grip, right ankle dorsiflexor 4 minus right hip flexor knee extensor Ataxia on Right side with reduced sensory 5/5 left deltoid, bicep, tricep, grip, hip flexors, knee extensors, ankle dorsiflexor plantar flexor   Assessment/Plan: 1. Functional deficits secondary to L PCA embolic infarct 0/81/4481 superimposed on L high parietal infarct onset 12/25/2013 which require 3+ hours per day of interdisciplinary therapy  in a comprehensive inpatient rehab setting. Physiatrist is providing close team supervision and 24 hour management of active medical problems listed below. Physiatrist and rehab team continue to assess barriers to discharge/monitor patient progress toward functional and medical goals.  FIM: FIM - Bathing Bathing Steps Patient Completed: Chest;Right Arm;Abdomen;Front perineal area;Left upper leg;Right upper leg;Left Arm;Right lower leg (including foot);Left lower leg (including foot) Bathing: 4: Min-Patient  completes 8-9 48f 10 parts or 75+ percent  FIM - Upper Body Dressing/Undressing Upper body dressing/undressing steps patient completed: Thread/unthread left sleeve of pullover shirt/dress;Put head through opening of pull over shirt/dress Upper body dressing/undressing: 3: Mod-Patient completed 50-74% of tasks FIM - Lower Body Dressing/Undressing Lower body dressing/undressing steps patient completed: Thread/unthread right underwear leg;Thread/unthread left underwear leg;Thread/unthread right pants leg;Thread/unthread left pants leg;Don/Doff left sock Lower body dressing/undressing: 3: Mod-Patient completed 50-74% of tasks  FIM - Toileting Toileting steps completed by patient: Adjust clothing prior to toileting Toileting Assistive Devices: Grab bar or rail for support Toileting: 2: Max-Patient completed 1 of 3 steps  FIM - Radio producer Devices: Grab bars;Walker Toilet Transfers: 4-To toilet/BSC: Min A (steadying Pt. > 75%);4-From toilet/BSC: Min A (steadying Pt. > 75%)  FIM - Control and instrumentation engineer Devices: Walker;Arm rests Bed/Chair Transfer: 7: Sit > Supine: No assist;7: Supine > Sit: No assist;4: Bed > Chair or W/C: Min A (steadying Pt. > 75%);4: Chair or W/C > Bed: Min A (steadying Pt. > 75%)  FIM - Locomotion: Wheelchair Distance: 120 Locomotion: Wheelchair: 1: Total Assistance/staff pushes wheelchair (Pt<25%) FIM - Locomotion: Ambulation Locomotion: Ambulation Assistive Devices: Administrator Ambulation/Gait Assistance: 4: Min guard;5: Supervision Locomotion: Ambulation: 2: Travels 50 - 149 ft with minimal assistance (Pt.>75%)  Comprehension Comprehension Mode: Auditory Comprehension: 4-Understands basic 75 - 89% of the time/requires cueing 10 - 24% of the time  Expression Expression Mode: Verbal Expression: 3-Expresses basic 50 - 74% of the time/requires cueing 25 - 50% of the time. Needs to repeat parts of  sentences.  Social Interaction Social Interaction: 5-Interacts appropriately 90% of the time - Needs monitoring or encouragement for participation or interaction.  Problem Solving Problem Solving: 2-Solves basic 25 - 49% of the time - needs direction more than half the time to initiate, plan or complete simple activities  Memory Memory: 2-Recognizes or recalls 25 - 49% of the time/requires cueing 51 - 75% of the time   Medical Problem List and Plan:  1. DVT Prophylaxis/Anticoagulation: Pharmaceutical: Xarelto  2. Pain Management: tylenol  3. Mood: monitor for depression, encourage interaction  4. Neuropsych: This patient is not capable of making decisions on his own behalf.  5. Afib Xarelto , HR intermittently elevated at rest and with activity resume low dose dig 6. Resp- recent Influenza and hx COPD- IS,monitor  7.  Dysphagia improving. BUN/CReat rising Off IVF, will resume .45 NS 8. Urinary frequency:UTI on KeflexLOS (Days) 16 A FACE TO FACE EVALUATION WAS PERFORMED  [name] E 01/25/2014, 6:44 AM

## 2014-01-25 NOTE — Progress Notes (Signed)
Speech Language Pathology Daily Session Note  Patient Details  Name: [name] MRN: [id number] Date of Birth: [birth year]  Today's Date: 01/25/2014 Time: 1000-1045 Time Calculation (min): 45 min  Short Term Goals: Week 3: SLP Short Term Goal 1 (Week 3): Pt will consume Dys 2 textures and nectar-thick liquids, utilizing safe swallowing strategies with Min cues SLP Short Term Goal 2 (Week 3): Pt will demonstrate effective mastication and oral clearance with dys 3 textures to assess readiness for diet advancement SLP Short Term Goal 3 (Week 3): Pt will demonstrate basic problem solving during functional task with Mod cues SLP Short Term Goal 4 (Week 3): Pt will utilize speech intelligibility strategies in conversation with Min cues  Skilled Therapeutic Interventions: Skilled treatment focused on swallowing, speech, and cognitive-linguistic goals. SLP facilitated session with Mod cues for increased vocal intensity to increase speech intelligibility. Pt consumed trials of thin liquids with cough or throat clear after ~75% of trials. Pt exhibits oral holding and manipulation of thin liquids, which wife reports is a "bad habit" he has always had; question premature spillage of thin liquid material as pt is orally holding. Chin tuck was not effective at reducing overt s/s of aspiration. Pt sustained attention to cognitive-linguistic task for ~30 minutes with Mod cues for selective attention in distracting environment. He identified pictures of common objects with Min phonemic cues provided for word-finding. Pt compared/contrasted items with Mod cues. Continue plan of care.   FIM:  Comprehension Comprehension Mode: Auditory Comprehension: 5-Understands basic 90% of the time/requires cueing < 10% of the time Expression Expression Mode: Verbal Expression: 3-Expresses basic 50 - 74% of the time/requires cueing 25 - 50% of the time. Needs to repeat parts of sentences. Social Interaction Social  Interaction: 5-Interacts appropriately 90% of the time - Needs monitoring or encouragement for participation or interaction. Problem Solving Problem Solving: 3-Solves basic 50 - 74% of the time/requires cueing 25 - 49% of the time Memory Memory: 3-Recognizes or recalls 50 - 74% of the time/requires cueing 25 - 49% of the time FIM - Eating Eating Activity: 5: Set-up assist for open containers;5: Needs verbal cues/supervision  Pain  No/denies pain  Therapy/Group: Individual Therapy   Germain Osgood, M.A. CCC-SLP [phone]   Germain Osgood 01/25/2014, 3:20 PM

## 2014-01-25 NOTE — Progress Notes (Signed)
Physical Therapy Session Note  Patient Details  Name: [name] MRN: [id number] Date of Birth: April 20, 1927  Today's Date: 01/25/2014 Time: 2353-6144 and 3154-0086 Time Calculation (min): 65 min and 32 min  Short Term Goals: Week 3: STG's=LTG's secondary to ELOS  Skilled Therapeutic Interventions/Progress Updates:    Treatment Session 1: Pt received seated in w/c accompanied by wife; agreeable to session. Self-propulsion of w/c x135' in controlled environment with bilat UE's with min A to increase LUE force for effective obstacle negotiation. Pt performed gait x2 trials (seated rest break between trials) x155', x160' in controlled environment with rolling walker and supervision to min guard. Negotiation of 4 (8" steps) with bilat UE support at L rail, sideways, step-to pattern with min A and min verbal cueing for foot placement. Negotiation of 5 steps with bilat rails, forward-facing, reciprocal pattern, min A. Performed NuStep for R NMR x5 minutes (limited by pt fatigue) at level 3 resistance. Performed multiple sit<>stands from mat table, w/c, standard chair, and bedside chair with min A to stabilize L LE (due to pt tendency to maintain majority of weight on R side). Transferred to toilet with min guard, rolling walker, and rails. Session ended with pt seated on toilet with wife present and all needs within reach. Overall, pt with effective between-session carryover of >75% of cues with sit<>stand transfers and gait.  Treatment Session 2: Pt received seated in w/c; agreeable to therapy. Session focused on continued hands-on family training, on increasing pt stability/independence with functional transfers and gait in home (carpeted) environment. Gait x180' and x130' in controlled environment with rolling walker and min guard of wife. Pt performed multiple sit<>stand transfers and w/c<>bedside chair transfers with wife providing verbal cueing for setup and min guard-min A. Therapist provided  demonstration and verbal cueing to promote safe body position with side stepping. See vital signs for post-gait HR, SpO2. Therapist departed with pt seated in wheelchair with wife present and all needs within reach.  Therapy Documentation Precautions:  Precautions Precautions: Fall Precaution Comments: Lateral trunk lean to R; sensory deficits on R side Restrictions Weight Bearing Restrictions: No Pain: Pain Assessment Pain Assessment: No/denies pain Pain Score: 0-No pain Locomotion : Ambulation Ambulation/Gait Assistance: 4: Min guard;5: Supervision Wheelchair Mobility Distance: 135   See FIM for current functional status  Therapy/Group: Individual Therapy  [name], Malva Cogan 01/25/2014, 12:21 PM

## 2014-01-25 NOTE — Patient Care Conference (Signed)
Inpatient RehabilitationTeam Conference and Plan of Care Update Date: 01/23/2014   Time: 11:00 AM    Patient Name: [name]      Medical Record Number: [id number]  Date of Birth: 11-09-1927 Sex: Male         Room/Bed: 4W08C/4W08C-01 Payor Info: Payor: [employer] MEDICARE / Plan: AARP MEDICARE COMPLETE / Product Type: *No Product type* /    Admitting Diagnosis: Deconditioned  Admit Date/Time:  01/09/2014  6:47 PM Admission Comments: No comment available   Primary Diagnosis:  CVA (cerebral infarction) Principal Problem: CVA (cerebral infarction)  Patient Active Problem List   Diagnosis Date Noted  . Acute CVA (cerebrovascular accident) 01/07/2014  . Altered mental status 01/05/2014  . Influenza with other respiratory manifestations 01/01/2014  . Prediabetes 01/01/2014  . CVA (cerebral infarction) 01/01/2014  . Atrial fibrillation 12/25/2013  . Hypothermia 03/23/2013  . Constipation 10/16/2012  . Preventative health care 09/17/2012  . Nocturia 09/17/2012  . UTI (lower urinary tract infection) 08/22/2012  . Leukocytosis 08/22/2012  . Pseudogout 08/22/2012  . Encephalopathy 08/21/2012  . Left knee pain 08/21/2012  . Prerenal azotemia 08/21/2012  . CVA (cerebral vascular accident)-Acute right small nonhemorrhagic infarct seen on MRI 08/17/2012  . Lacunar stroke 04/07/2011  . Pleural effusion 04/07/2011  . 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 Long term current use of anticoagulant 04/07/2011  . NEOPLASM, MALIGNANT, BLADDER 04/10/2009  . HYPERLIPIDEMIA 04/10/2009  . ANEMIA-NOS 04/10/2009  . 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 ANXIETY 04/10/2009  . HYPERTENSION 04/10/2009  . COPD 04/10/2009  . TRANSIENT ISCHEMIC ATTACK, HX OF 04/10/2009    Expected Discharge Date: Expected Discharge Date: 01/31/14  Team Members Present: Physician leading conference: Dr. Alysia Penna Social Worker Present: Alfonse Alpers, LCSW Nurse Present: Junius Creamer, RN PT Present:  Other (comment);Georjean Mode, PT ([address] [place], [place]) OT Present: Antony Salmon, OT SLP Present: Germain Osgood, SLP PPS Coordinator present : Daiva Nakayama, RN, CRRN     Current Status/Progress Goal Weekly Team Focus  Medical   poor intake  adequate po intake  IV fluids, trial off prior to D/C   Bowel/Bladder   pt continent of bowel. LBM 1-26. Pt can be incont. of bladder at times. Wears brief and pad with condom cath HS.   Manage of bowel and bladder with minimal assist. Continent of bladder  continue bowel and bladder regimen and timed toileting   Swallow/Nutrition/ Hydration   Dys 2 textures and nectar thick liquids  supervision with least restrictive PO  diet tolerance, trials of advanced textures, increase use of swallowing strategies   ADL's   min-mod dressing and bathing, min assits functional transfers, max assist toilet task  supervision overall  NMR, cognition, functional trasnfers, family education, activity tolerance, postural control in standing, standing balance   Mobility   Min A overall  Supervision overall  Dynamic standing balance, postural normalization with functional mobility, gait/transfer training, initiation of gait in community environment, continued hands-on family training   Communication   Mod cues for vocal intensity, supervision for confrontational naming  Min  increase use of speech intelligibility strategies   Safety/Cognition/ Behavioral Observations  Max A basic money management, Min cues sustained attention  Min  basic problem solving, awareness   Pain   no complaints of pain         Skin   skin clean dry and intact.   No additional skin breakdown       Rehab Goals Patient on target to meet rehab goals: Yes Rehab Goals Revised: None *  See Care Plan and progress notes for long and short-term goals.  Barriers to Discharge: see above    Possible Resolutions to Barriers:  see above    Discharge Planning/Teaching Needs:  Wife plans to provide pt  with 24/7 supervision at discharge and can provide minimal assistance required.   Wife is already participating with therapists for family education, as she is here with pt daily.   Team Discussion:  Pt needs to increase his intake and the team is trying to encourage him to do so.  Currently he has a UTI and is receiving IV fluids.  Speech is completing a trial of thin liquids so that he may be more likely to drink more, as pt mostly drank water prior to stroke.  Pt is doing well with ambulation.  Revisions to Treatment Plan:  None   Continued Need for Acute Rehabilitation Level of Care: The patient requires daily medical management by a physician with specialized training in physical medicine and rehabilitation for the following conditions: Daily direction of a multidisciplinary physical rehabilitation program to ensure safe treatment while eliciting the highest outcome that is of practical value to the patient.: Yes Daily medical management of patient stability for increased activity during participation in an intensive rehabilitation regime.: Yes Daily analysis of laboratory values and/or radiology reports with any subsequent need for medication adjustment of medical intervention for : Neurological problems  [name], [name] 01/25/2014, 12:00 PM

## 2014-01-26 ENCOUNTER — Inpatient Hospital Stay (HOSPITAL_COMMUNITY): Payer: Medicare Other | Admitting: Physical Therapy

## 2014-01-26 DIAGNOSIS — R5381 Other malaise: Secondary | ICD-10-CM

## 2014-01-26 DIAGNOSIS — I6340 Cerebral infarction due to embolism of unspecified cerebral artery: Secondary | ICD-10-CM

## 2014-01-26 DIAGNOSIS — G8110 Spastic hemiplegia affecting unspecified side: Secondary | ICD-10-CM

## 2014-01-26 DIAGNOSIS — I69920 Aphasia following unspecified cerebrovascular disease: Secondary | ICD-10-CM

## 2014-01-26 NOTE — Progress Notes (Signed)
ANTICOAGULATION CONSULT NOTE - Follow Up Consult  Pharmacy Consult for Xarelto  Indication: atrial fibrillation  Allergies  Allergen Reactions  . Diazepam     REACTION: agitation  . Ezetimibe-Simvastatin     unknown  . Morphine Other (See Comments)    Hallucinations.  . Sulfonamide Derivatives Hives    Patient Measurements: Height: 6' 2.02" (188 cm) Weight: 125 lb 3.5 oz (56.8 kg) IBW/kg (Calculated) : 82.24  Vital Signs: Temp: 98.4 F (36.9 C) (01/31 0617) Temp src: Oral (01/31 0617) BP: 94/62 mmHg (01/31 0947) Pulse Rate: 112 (01/31 0947)  Labs: No results found for this basename: HGB, HCT, PLT, APTT, LABPROT, INR, HEPARINUNFRC, CREATININE, CKTOTAL, CKMB, TROPONINI,  in the last 72 hours  Estimated Creatinine Clearance: 44.6 ml/min (by C-G formula based on Cr of 0.99).  Assessment: 9 yof continues on xarelto for afib. SCr = 0.99, CrCl ~ 45 ml/min.  Current dose of Xarelto is 15 mg daily which is appropriate for renal fxn. No overt bleeding noted. Recent hx of small amount of hematuria without clots on 1/13 (likely related to surgery for bladder cancer). H/H 10.3/30.7, plts 253 as of 1/23. No bleeding reported currently.  Goal of Therapy:  Therapeutic anticoagulation   Plan:  1. Continue xarelto 15mg  PO daily 2. Monitor for signs and symptoms of bleeding  Legrand Como, Pharm.D., BCPS, AAHIVP Clinical Pharmacist Phone: [phone] or [phone] 01/26/2014, 1:10 PM

## 2014-01-26 NOTE — Progress Notes (Signed)
Physical Therapy Session Note  Patient Details  Name: [name] MRN: [id number] Date of Birth: 1927/04/03  Today's Date: 01/26/2014 Time: 1135-1209 Time Calculation (min): 34 min  Short Term Goals: Week 3:  PT Short Term Goal 1 (Week 3): STG's=LTG's secondary to ELOS  Skilled Therapeutic Interventions/Progress Updates:    Pt received seated in w/c; agreeable to session. Session focused on continued hands-on family training. Performed gait x155' in controlled and home environment with rolling walker and min guard of wife. Per pt request to use bathroom, pt performed toilet transfer (apartment) with rolling walker, grab bar, and min guard. Per request of wife, performed sit>supine with  in apartment bed x2 trials with supervision; PT provided cueing for technique during initial trial; wife provided effective cueing during subsequent trial. Performed gait x168' in controlled and home environments with rolling walker and min guard of wife. Wife able to safely provide min guard and effective cues during gait and basic transfers. Will require additional hands-on training with car transfers and stair negotiation. Therapist departed with pt seated in bedside chair with wife present and all needs within reach.  Therapy Documentation Precautions:  Precautions Precautions: Fall Precaution Comments: Lateral trunk lean to R; sensory deficits on R side Restrictions Weight Bearing Restrictions: No Vital Signs: Therapy Vitals Pulse Rate: 112 BP: 94/62 mmHg Patient Position, if appropriate: Sitting Pain: Pain Assessment Pain Assessment: No/denies pain Pain Score: 0-No pain Locomotion : Ambulation Ambulation/Gait Assistance: 4: Min guard   See FIM for current functional status  Therapy/Group: Individual Therapy  [name], Malva Cogan 01/26/2014, 12:25 PM

## 2014-01-26 NOTE — Progress Notes (Signed)
80 y.o. 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 He was admitted 12/25/2013 with altered mental status and low grade fever of 100 Fahrenheit and reported hematuria. Cranial CT scan negative and MRI of the brain showed punctate acute infarct in the high left parietal lobe as well as extensive chronic ischemic changes. Patient's Xarelto held for a short time secondary to hematuria. He was started on IV rocephin due to SIRS as well as Tamiflu as he tested positive for type A influenza. He was placed on Dysphagia 1 nectar thick liquid diet per ST recommendations. Patient with difficulty with processing with easy distractibility, as well as problems with initiation. Needs tactile cues for basic tasks and mobility. Mentation improving and neurology recommends resuming Xarelto for embolic stroke due to A Fib  On 01/05/14, he was completing his shower when he developed acute onset of right sided weakness with speech difficulty. Code stroke was initiated and he was transferred to acute services. MRI 01/06/2014 revealed Left PCA distribution infarct  Subjective No specific complaints. Denies pain. I Objective: Vital Signs: Blood pressure 106/89, pulse 105, temperature 98.4 F (36.9 C), temperature source Oral, resp. rate 17, height 6' 2.02" (1.88 m), weight 125 lb 3.5 oz (56.8 kg), SpO2 96.00%.  Elderly male in no acute distress. Chest clear to auscultation. Cardiac: Irregular irregular rhythm Abdomen: Active bowel sounds, soft. Extremities no significant edema Neurologic exam he is alert.  Assessment/Plan: 1. Functional deficits secondary to L PCA embolic infarct 9/51/8841 superimposed on L high parietal infarct onset 12/25/2013   Medical Problem List and Plan:  1. DVT Prophylaxis/Anticoagulation: Pharmaceutical: Xarelto  2. Pain Management: tylenol  3. Mood: monitor  for depression, encourage interaction  4. Neuropsych: This patient is not capable of making decisions on his own behalf.  5. Afib Xarelto , HR intermittently elevated at rest and with activity resume low dose dig 6. Resp- recent Influenza and hx COPD- IS,monitor  7.  Dysphagia improving. BUN/CReat  Lab Results  Component Value Date   CREATININE 0.99 01/23/2014   8. Urinary frequency:UTI on Keflex, last dose schedule 01/29/14 LOS (Days) 17 A FACE TO FACE EVALUATION WAS PERFORMED  [name] [name] 01/26/2014, 9:12 AM

## 2014-01-26 NOTE — Plan of Care (Signed)
Problem: RH BLADDER ELIMINATION Goal: RH STG MANAGE BLADDER WITH ASSISTANCE STG Manage Bladder With Assistance- Min  Outcome: Not Progressing Wears condom cath. At night.

## 2014-01-27 ENCOUNTER — Inpatient Hospital Stay (HOSPITAL_COMMUNITY): Payer: Medicare Other | Admitting: Physical Therapy

## 2014-01-27 NOTE — Progress Notes (Signed)
Physical Therapy Note  Patient Details  Name: Nour Scalise MRN: [id number] Date of Birth: 06-27-1927 Today's Date: 01/27/2014  1430 -47 (40 minutes) individual Pain : no reported pain Focus of treatment: Therapeutic exercise focused on strengthening/control RT LE; gait training Treatment: Pt up in wc upon arrival; wife present; transfers stand/turn RW min to close SBA with vcs for hand placement sit >< stand; Nustep level 4 X 10 minutes; up/down 4 inch step RT LE with RW support for quad control; gait forward/backward with RW focusing on increased step length RT LE and maintaining shoulder width BOS; standing horseshoe pitch using RT UE; returned to room with wife present.    Nyssa Sayegh,JIM 01/27/2014, 3:16 PM

## 2014-01-27 NOTE — Progress Notes (Signed)
80 y.o. right handed male with history of COPD, prior CVA 08/2012 with right sided weakness and dysphagia ( CIR stay) , A fib--Xarelto and bladder cancer with ongoing treatment at [facility]. He was admitted 12/25/2013 with altered mental status and low grade fever of 100 Fahrenheit and reported hematuria. Cranial CT scan negative and MRI of the brain showed punctate acute infarct in the high left parietal lobe as well as extensive chronic ischemic changes. Patient's Xarelto held for a short time secondary to hematuria. He was started on IV rocephin due to SIRS as well as Tamiflu as he tested positive for type A influenza. He was placed on Dysphagia 1 nectar thick liquid diet per ST recommendations. Patient with difficulty with processing with easy distractibility, as well as problems with initiation. Needs tactile cues for basic tasks and mobility. Mentation improving and neurology recommends resuming Xarelto for embolic stroke due to A Fib  On 01/05/14, he was completing his shower when he developed acute onset of right sided weakness with speech difficulty. Code stroke was initiated and he was transferred to acute services. MRI 01/06/2014 revealed Left PCA distribution infarct  Subjective No specific complaints. Wife with patieint today I Objective: Vital Signs: Blood pressure 115/71, pulse 110, temperature 98 F (36.7 C), temperature source Oral, resp. rate 18, height 6' 2.02" (1.88 m), weight 125 lb 3.5 oz (56.8 kg), SpO2 99.00%.  Elderly male in no acute distress. Chest clear to auscultation. Cardiac: Irregular irregular rhythm Abdomen: Active bowel sounds, soft. Extremities no significant edema Neurologic exam he is alert.  Assessment/Plan: 1. Functional deficits secondary to L PCA embolic infarct 0/06/6225 superimposed on L high parietal infarct onset 12/25/2013   Medical Problem List and Plan:  1. DVT Prophylaxis/Anticoagulation: Pharmaceutical: Xarelto  2. Pain Management: tylenol  3. Mood:  monitor for depression, encourage interaction  4. Neuropsych: This patient is not capable of making decisions on his own behalf.  5. Afib Xarelto ,note bp, on diltiazem. His HR is on the high side . Will need to continue to watchh BP and HR 6. Resp- recent Influenza and hx COPD- IS,monitor  7.  Dysphagia improving. BUN/CReat  Lab Results  Component Value Date   CREATININE 0.99 01/23/2014   8. Urinary frequency:UTI on Keflex, last dose schedule 01/29/14 LOS (Days) 18 A FACE TO [place] 01/27/2014, 9:52 AM

## 2014-01-28 ENCOUNTER — Inpatient Hospital Stay (HOSPITAL_COMMUNITY): Payer: Medicare Other | Admitting: Physical Therapy

## 2014-01-28 ENCOUNTER — Inpatient Hospital Stay (HOSPITAL_COMMUNITY): Payer: Medicare Other

## 2014-01-28 ENCOUNTER — Encounter (HOSPITAL_COMMUNITY): Payer: Medicare Other

## 2014-01-28 DIAGNOSIS — I6340 Cerebral infarction due to embolism of unspecified cerebral artery: Secondary | ICD-10-CM

## 2014-01-28 DIAGNOSIS — I6350 Cerebral infarction due to unspecified occlusion or stenosis of unspecified cerebral artery: Secondary | ICD-10-CM

## 2014-01-28 DIAGNOSIS — I69920 Aphasia following unspecified cerebrovascular disease: Secondary | ICD-10-CM

## 2014-01-28 DIAGNOSIS — G8110 Spastic hemiplegia affecting unspecified side: Secondary | ICD-10-CM

## 2014-01-28 DIAGNOSIS — R5381 Other malaise: Secondary | ICD-10-CM

## 2014-01-28 LAB — BASIC METABOLIC PANEL WITH GFR
BUN: 25 mg/dL — ABNORMAL HIGH (ref 6–23)
CO2: 28 meq/L (ref 19–32)
Calcium: 9.4 mg/dL (ref 8.4–10.5)
Chloride: 98 meq/L (ref 96–112)
Creatinine, Ser: 1.02 mg/dL (ref 0.50–1.35)
GFR calc Af Amer: 76 mL/min — ABNORMAL LOW
GFR calc non Af Amer: 65 mL/min — ABNORMAL LOW
Glucose, Bld: 135 mg/dL — ABNORMAL HIGH (ref 70–99)
Potassium: 4.3 meq/L (ref 3.7–5.3)
Sodium: 139 meq/L (ref 137–147)

## 2014-01-28 NOTE — Progress Notes (Signed)
Physical Therapy Session Note  Patient Details  Name: [name] MRN: [id number] Date of Birth: 07-13-1927  Today's Date: 01/28/2014 Time: 1105-1202 and 2423-5361 Time Calculation (min): 57 min and 30 min  Short Term Goals: Week 3:  PT Short Term Goal 1 (Week 3): STG's=LTG's secondary to ELOS  Skilled Therapeutic Interventions/Progress Updates:    Treatment Session 1: Pt received seated in w/c; agreeable to therapy. Session focused on increasing pt stability/independence with gait and functional transfers in home environment. Pt performed gait x200' in controlled and home environments with rolling walker, supervision-min guard secondary to single posterior LOB, which occurred when pt turned head to R side when passing nurses' station. Pt with effective hip and ankle strategies (L>R)  but no stepping strategy to recover from LOB.   In apartment, performed multiple transfers from couch<>bed with rolling walker and supervision with bed>chair, supervision to min A with chair>bed secondary to LOB to R side when turning to L side. Pt responded favorably to blocked practice of multiple transfers, questioning cues to facilitate active learning, and feedback immediately after each transfer. Required mod cueing initially, no cueing with final 2 transfers. Returned to room via gait x200' in controlled and home environments with rolling walker, supervision-min guard. Therapist departed room with pt seated in w/c with wife present and all needs within reach.  Treatment Session 2: Pt received seated in w/c with wife present; agreeable to therapy. Session focused on continued hands-on family training. Transported pt to gym in w/c, where pt negotiated 5 steps with bilat UE support at L rail facing sideways with step-to pattern and min A of wife. Wife provided appropriate assistance but required min PT cueing to cue pt effectively (focus on foot placement) . Performed second stair negotiation trial with same  technique, wife assisting. Wife exhibited effective within-session carryover of cueing.   PT and wife discussed home setup and how to utilize transport chair, rolling walker to ensure safety entering home. Wife reports feeling "a lot better" about negotiating stairs, landing to enter home. Will re-assess pt/wife safety with stair negotiation technique, assistance. Transported pt in w/c to room, where pt performed stand pivot transfer from w/c>bedside chair with rolling walker and min guard. Therapist departed with pt seated in bedside chair with wife present and all needs within reach.  Long term goal addressing stair negotiation downgraded secondary to slow pt progress, wife report of feeling comfortable proving assist with stair negotiation, and wife demonstration of safety providing min A with stair negotiation.  Therapy Documentation Precautions:  Precautions Precautions: Fall Precaution Comments: Lateral trunk lean to R; sensory deficits on R side Restrictions Weight Bearing Restrictions: No Vital Signs: Therapy Vitals Pulse Rate: 120 Pain: Pain Assessment Pain Assessment: No/denies pain Pain Score: 0-No pain Locomotion : Ambulation Ambulation/Gait Assistance: 4: Min guard   See FIM for current functional status  Therapy/Group: Individual Therapy  Jaimee Corum, Malva Cogan 01/28/2014, 12:05 PM

## 2014-01-28 NOTE — Progress Notes (Signed)
Speech Language Pathology Daily Session Note  Patient Details  Name: [name] MRN: [id number] Date of Birth: 1927/10/18  Today's Date: 01/28/2014 Time: 9892-1194 Time Calculation (min): 45 min  Short Term Goals: Week 3: SLP Short Term Goal 1 (Week 3): Pt will consume Dys 2 textures and nectar-thick liquids, utilizing safe swallowing strategies with Min cues SLP Short Term Goal 2 (Week 3): Pt will demonstrate effective mastication and oral clearance with dys 3 textures to assess readiness for diet advancement SLP Short Term Goal 3 (Week 3): Pt will demonstrate basic problem solving during functional task with Mod cues SLP Short Term Goal 4 (Week 3): Pt will utilize speech intelligibility strategies in conversation with Min cues  Skilled Therapeutic Interventions: Skilled treatment focused on swallowing goals. SLP facilitated session with Min verbal cues for increased vocal intensity at the conversational level. Pt consumed cup sips of water with Mod cues for small sips and to reduce oral holding. He consumed ~5 oz of water with cough x1 and throat clear x2, which SLP suspects has been strong enough to adequate clear possible penetrates during PO intake. Recommend to initiate water protocol; thorough education provided to patient and wife with emphasis on oral care. Pt consumed trials of Dys 3 textures, demonstrating adequate mastication and oral clearance with trace residuals with supervision. Recommend to advance solid textures. Continue plan of care.   FIM:  Comprehension Comprehension Mode: Auditory Comprehension: 4-Understands basic 75 - 89% of the time/requires cueing 10 - 24% of the time Expression Expression Mode: Verbal Expression: 4-Expresses basic 75 - 89% of the time/requires cueing 10 - 24% of the time. Needs helper to occlude trach/needs to repeat words. Social Interaction Social Interaction: 4-Interacts appropriately 75 - 89% of the time - Needs redirection for appropriate  language or to initiate interaction. Problem Solving Problem Solving: 3-Solves basic 50 - 74% of the time/requires cueing 25 - 49% of the time Memory Memory: 3-Recognizes or recalls 50 - 74% of the time/requires cueing 25 - 49% of the time FIM - Eating Eating Activity: 5: Set-up assist for open containers;5: Needs verbal cues/supervision  Pain  No/denies pain  Therapy/Group: Individual Therapy   Germain Osgood, M.A. CCC-SLP [phone]   Germain Osgood 01/28/2014, 4:57 PM

## 2014-01-28 NOTE — Progress Notes (Signed)
80 y.o. right handed male with history of COPD, prior CVA 08/2012 with right sided weakness and dysphagia ( CIR stay) , A fib--Xarelto and bladder cancer with ongoing treatment at [facility]. He was admitted 12/25/2013 with altered mental status and low grade fever of 100 Fahrenheit and reported hematuria. Cranial CT scan negative and MRI of the brain showed punctate acute infarct in the high left parietal lobe as well as extensive chronic ischemic changes. Patient's Xarelto held for a short time secondary to hematuria. He was started on IV rocephin due to SIRS as well as Tamiflu as he tested positive for type A influenza. He was placed on Dysphagia 1 nectar thick liquid diet per ST recommendations. Patient with difficulty with processing with easy distractibility, as well as problems with initiation. Needs tactile cues for basic tasks and mobility. Mentation improving and neurology recommends resuming Xarelto for embolic stroke due to A Fib  On 01/05/14, he was completing his shower when he developed acute onset of right sided weakness with speech difficulty. Code stroke was initiated and he was transferred to acute services. MRI 01/06/2014 revealed Left PCA distribution infarct  Subjective  Intake 50-100% Good fluid intake Off IVF Review of Systems - difficult to obtain secondary to aphasia and reduced attn Objective: Vital Signs: Blood pressure 120/70, pulse 124, temperature 98.4 F (36.9 C), temperature source Oral, resp. rate 16, height 6' 2.02" (1.88 m), weight 56.8 kg (125 lb 3.5 oz), SpO2 98.00%. No results found. No results found for this or any previous visit (from the past 72 hour(s)).    Constitutional: He appears well-developed and well-nourished.  HENT:  Head: Normocephalic and atraumatic.  Eyes: Conjunctivae and EOM are normal. Pupils are equal, round, and reactive to light.  Neck: Normal range of motion. Neck supple.  Cardiovascular: Normal rate and normal heart sounds. An irregularly  irregular rhythm present.  Respiratory: Effort normal. No respiratory distress. He has decreased breath sounds in the right lower field and the left lower field.  Neurological: He is fairly alert. Delayed processing, poor awareness. He exhibits normal muscle tone.  Motor strength is 3 minus right deltoid, bicep, tricep, grip, right ankle dorsiflexor 4 minus right hip flexor knee extensor Ataxia on Right side with reduced sensory 5/5 left deltoid, bicep, tricep, grip, hip flexors, knee extensors, ankle dorsiflexor plantar flexor   Assessment/Plan: 1. Functional deficits secondary to L PCA embolic infarct 07/08/4579 superimposed on L high parietal infarct onset 12/25/2013 which require 3+ hours per day of interdisciplinary therapy in a comprehensive inpatient rehab setting. Physiatrist is providing close team supervision and 24 hour management of active medical problems listed below. Physiatrist and rehab team continue to assess barriers to discharge/monitor patient progress toward functional and medical goals.  FIM: FIM - Bathing Bathing Steps Patient Completed: Chest;Right Arm;Abdomen;Front perineal area;Left upper leg;Right upper leg;Left Arm;Right lower leg (including foot);Left lower leg (including foot) Bathing: 4: Min-Patient completes 8-9 19f 10 parts or 75+ percent  FIM - Upper Body Dressing/Undressing Upper body dressing/undressing steps patient completed: Thread/unthread left sleeve of pullover shirt/dress;Put head through opening of pull over shirt/dress;Thread/unthread right sleeve of pullover shirt/dresss Upper body dressing/undressing: 4: Min-Patient completed 75 plus % of tasks FIM - Lower Body Dressing/Undressing Lower body dressing/undressing steps patient completed: Thread/unthread right underwear leg;Thread/unthread left underwear leg;Thread/unthread right pants leg;Thread/unthread left pants leg;Pull underwear up/down;Pull pants up/down;Don/Doff right shoe Lower body  dressing/undressing: 4: Min-Patient completed 75 plus % of tasks  FIM - Toileting Toileting steps completed by patient: Adjust clothing prior  to toileting;Adjust clothing after toileting Toileting Assistive Devices: Grab bar or rail for support Toileting: 3: Mod-Patient completed 2 of 3 steps  FIM - Radio producer Devices: Grab bars;Walker Toilet Transfers: 4-To toilet/BSC: Min A (steadying Pt. > 75%);4-From toilet/BSC: Min A (steadying Pt. > 75%)  FIM - Control and instrumentation engineer Devices: Walker;Arm rests Bed/Chair Transfer: 4: Bed > Chair or W/C: Min A (steadying Pt. > 75%);4: Chair or W/C > Bed: Min A (steadying Pt. > 75%)  FIM - Locomotion: Wheelchair Distance: 135 Locomotion: Wheelchair: 0: Activity did not occur FIM - Locomotion: Ambulation Locomotion: Ambulation Assistive Devices: Administrator Ambulation/Gait Assistance: 4: Min guard Locomotion: Ambulation: 4: Travels 150 ft or more with minimal assistance (Pt.>75%)  Comprehension Comprehension Mode: Auditory Comprehension: 3-Understands basic 50 - 74% of the time/requires cueing 25 - 50%  of the time  Expression Expression Mode: Verbal Expression: 3-Expresses basic 50 - 74% of the time/requires cueing 25 - 50% of the time. Needs to repeat parts of sentences.  Social Interaction Social Interaction: 4-Interacts appropriately 75 - 89% of the time - Needs redirection for appropriate language or to initiate interaction.  Problem Solving Problem Solving: 2-Solves basic 25 - 49% of the time - needs direction more than half the time to initiate, plan or complete simple activities  Memory Memory: 3-Recognizes or recalls 50 - 74% of the time/requires cueing 25 - 49% of the time   Medical Problem List and Plan:  1. DVT Prophylaxis/Anticoagulation: Pharmaceutical: Xarelto  2. Pain Management: tylenol  3. Mood: monitor for depression, encourage interaction  4. Neuropsych:  This patient is not capable of making decisions on his own behalf.  5. Afib Xarelto , HR intermittently elevated at rest and with activity resume low dose dig 6. Resp- recent Influenza and hx COPD- IS,monitor  7.  Dysphagia improving. Fluid intake better recheck BUN/Creat off IVF,  8. Urinary frequency:UTI treated LOS (Days) 19 A FACE TO FACE EVALUATION WAS PERFORMED  KIRSTEINS,ANDREW E 01/28/2014, 7:53 AM

## 2014-01-28 NOTE — Progress Notes (Signed)
Occupational Therapy Session Note  Patient Details  Name: Buford Bremer MRN: [id number] Date of Birth: 13-Aug-1927  Today's Date: 01/28/2014 Time: 0830-0930 Time Calculation (min): 60 min  Short Term Goals: Week 3:  OT Short Term Goal 1 (Week 3): Focus on LTGs  Skilled Therapeutic Interventions/Progress Updates:    Pt seen for ADL retraining with focus on dynamic standing balance, functional use of RUE, and activity tolerance. Pt received sitting EOB. Pt's wife present and willing to participate in ADL session hands-on for family training. She has been assisting pt with toilet transfers and toilet task. Ambulated to bathroom with RW and steadying assist. Pt's wife required mod cueing from therapist to allow pt to try and complete as many self-care tasks as he could, as she was very eager to assist. During dressing pt required cueing to visually attend to RUE when attempting to grab clothing item and to make sure he had grip before pulling on item. Wife also providing appropriate cueing for this. Pt completed several sit<>stand with wife with min-supervision. Wife assisted with threading BLE into undergarments despite cueing from therapist to have pt focus on this as he has consistently been successful. Completed oral care at sink using LUE primarily and HOH assist provided when using RUE. Educated wife on providing HOH assist to RUE for self-feeding as this is importance to pt. Discussed alternating use of LUE and RUE during task to ensure adequate nutritional intake, and providing HOH 2-3 bites then allowing pt to complete a bite without assist. Both pt and wife eager to do this for lunch. At end of session pt left with all items in reach and no questions or concerns.   Therapy Documentation Precautions:  Precautions Precautions: Fall Precaution Comments: Lateral trunk lean to R; sensory deficits on R side Restrictions Weight Bearing Restrictions: No General:   Vital Signs: Therapy  Vitals Pulse Rate: 120 Pain: Pain Assessment Pain Assessment: No/denies pain Pain Score: 0-No pain ADL:   Exercises:   Other Treatments:    See FIM for current functional status  Therapy/Group: Individual Therapy  Jessa Stinson, Quillian Quince 01/28/2014, 12:00 PM

## 2014-01-28 NOTE — Consult Note (Signed)
INITIAL DIAGNOSTIC EVALUATION - CONFIDENTIAL  Inpatient Rehabilitation   MEDICAL NECESSITY:  [name] was seen on the [place] [facility] for an initial diagnostic evaluation owing to the patient's diagnosis of stroke.   According to medical records, [name] was admitted to the rehab unit owing to "Functional deficits secondary to L PCA embolic infarct 06/27/6377 superimposed on L high parietal infarct onset 12/25/2013." He has a history of COPD, prior CVA 08/2012 with right sided weakness and dysphagia (CIR stay), A fib (Xarelto), and bladder cancer with ongoing treatment at [facility]. He was admitted 12/25/2013 with altered mental status and low grade fever of 100 Fahrenheit and reported hematuria. Head CT scan was reportedly negative though MRI of the brain showed an acute punctate infarct in the high left parietal lobe as well as extensive chronic ischemic changes. Patient reportedly exhibits slowed processing speed, distractibility, and problems with initiation. In addition, on 01/05/14, he was showering on the rehab unit when he developed acute onset of right sided weakness with speech difficulty. Code stroke was initiated and he was transferred to acute services.   During today's visit, [name] reported suffering from memory loss, decreased attention, and speech disruption. His voice is hoarse and hypophonic. He was quiet and did not spontaneously generate a lot of conversation. His wife spoke for him much of the time. He admitted to mild depressive symptoms owing to the recent loss of his brother and in reaction to his present medical situation. He has no history of chronic mental health issues. He questioned whether he was going to be able to recover fully from his stroke. He purportedly has quite the complex medical history.   [name] expressed trepidation about her husband's upcoming discharge. She wishes to implement in-home healthcare services for when  she has to leave to run errands. She wishes for social work to follow up with her about this topic. They have no family close by.   In general, [name] feels that he is making strides in therapy and they have enjoyed their staff and treatment team. No adjustment issues related to his hospital stay were endorsed.   PROCEDURES ADMINISTERED: [1 unit [id number] on 01/28/14] Diagnostic clinical interview  Review of available records  Emotional & Behavioral Evaluation: [name] was appropriately dressed for season and situation, and he appeared tidy and well-groomed. Normal posture was noted. He quiet and did not generate a lot of conversation. Initiation was limited. His speech was hypophonic and expressive aphasia was noted. His affect was flat. Attention and motivation were good.   From an emotional standpoint, [name] expressed symptoms of bereavement regarding the loss of his brother. No adjustment issues or chronic mental health issues were endorsed. Suicidal/homicidal ideation, plan or intent was denied. No manic or hypomanic episodes were reported. The patient denied ever experiencing any auditory/visual hallucinations. No major behavioral or personality changes were endorsed.    Overall, [name] endorsed experiencing at least a mild degree of cognitive deficit post stroke, as well as speech changes. He also admits to suffering from a degree of bereavement regarding his brother's death and his present medical situation. A great deal of time was spent providing supportive therapy and coping, as well as stroke education, including post discharge planning.     RECOMMENDATIONS    Social work meeting requested to discuss in-home services   Complete neuropsychological evaluation post discharge   Please provide information regarding the above services     [name],  Psy.D.  Clinical Neuropsychologist

## 2014-01-28 NOTE — Plan of Care (Signed)
Problem: RH Dressing Goal: LTG Patient will perform upper body dressing (OT) LTG Patient will perform upper body dressing with assist, with/without cues (OT).  Downgraded as patient dons 2 shirts and unable to manage second shirt down trunk  Problem: RH Toileting Goal: LTG Patient will perform toileting w/assist, cues/equip (OT) LTG: Patient will perform toiletiing (clothes management/hygiene) with assist, with/without cues using equipment (OT)  Downgraded as patient requires assist for thoroughness when having BM.

## 2014-01-29 ENCOUNTER — Inpatient Hospital Stay (HOSPITAL_COMMUNITY): Payer: Medicare Other

## 2014-01-29 ENCOUNTER — Inpatient Hospital Stay (HOSPITAL_COMMUNITY): Payer: Medicare Other | Admitting: Physical Therapy

## 2014-01-29 NOTE — Progress Notes (Signed)
Speech Language Pathology Daily Session Note  Patient Details  Name: [name] MRN: [id number] Date of Birth: 02-Jun-1927  Today's Date: 01/29/2014 Time: 0830-0920 Time Calculation (min): 50 min  Short Term Goals: Week 3: SLP Short Term Goal 1 (Week 3): Pt will consume Dys 2 textures and nectar-thick liquids, utilizing safe swallowing strategies with Min cues SLP Short Term Goal 2 (Week 3): Pt will demonstrate effective mastication and oral clearance with dys 3 textures to assess readiness for diet advancement SLP Short Term Goal 3 (Week 3): Pt will demonstrate basic problem solving during functional task with Mod cues SLP Short Term Goal 4 (Week 3): Pt will utilize speech intelligibility strategies in conversation with Min cues  Skilled Therapeutic Interventions: Skilled treatment focused on cognitive and swallowing goals. SLP facilitated session with skilled observation of breakfast meal, consisting of Dys 3 textures and nectar thick liquids. Pt exhibited delayed throat clear x1 after completion of all PO intake with supervision level cues. SLP provided education to pt/wife regarding Dys 3 textures, with pt/wife engaged and asking questions about food textures he can eat at home. Will provide handouts. Pt participated in basic money management task with Min cues for basic problem solving and Min-Mod cues for working memory. Pt participated in all therapy tasks without cues for redirection. Continue plan of care.   FIM:  Comprehension Comprehension Mode: Auditory Comprehension: 4-Understands basic 75 - 89% of the time/requires cueing 10 - 24% of the time Expression Expression Mode: Verbal Expression: 4-Expresses basic 75 - 89% of the time/requires cueing 10 - 24% of the time. Needs helper to occlude trach/needs to repeat words. Social Interaction Social Interaction: 4-Interacts appropriately 75 - 89% of the time - Needs redirection for appropriate language or to initiate  interaction. Problem Solving Problem Solving: 4-Solves basic 75 - 89% of the time/requires cueing 10 - 24% of the time Memory Memory: 3-Recognizes or recalls 50 - 74% of the time/requires cueing 25 - 49% of the time FIM - Eating Eating Activity: 5: Supervision/cues;5: Set-up assist for open containers  Pain Pain Assessment Pain Assessment: No/denies pain  Therapy/Group: Individual Therapy   [name], M.A. CCC-SLP [phone]   [name] 01/29/2014, 12:08 PM

## 2014-01-29 NOTE — Progress Notes (Signed)
Occupational Therapy Session Note  Patient Details  Name: [name] MRN: [id number] Date of Birth: 04/07/1927  Today's Date: 01/29/2014 Time: 0930-1030 and 1448-1856 Time Calculation (min): 60 min and 28 min   Short Term Goals: Week 3:  OT Short Term Goal 1 (Week 3): Focus on LTGs  Skilled Therapeutic Interventions/Progress Updates:    Session 1: Pt seen for ADL retraining with focus on postural control in standing, functional use of RUE, standing balance, and family education. Pt received sitting on toilet with wife present. Wife assisted with toilet task and transfer to shower requiring cues to allow pt to complete most of task. Completed bathing with supervision for standing balance as verbal cues provided for feet placement. Completed dressing with increased success using RUE to pull shirt down requiring min cueing for technique. Supervision for standing balance for clothing management around waist using BUE to pull up. Pt had new shoes requiring assist to don. Therapist provided elastic shoe laces to trial to increase independence with task. At end of session pt left sitting in w/c with all needs in reach. Pt did very well today and increased spirits as he was talking more and joking with therapist and wife.  Session 2: Pt seen for 1:1 OT session with focus on family education/training for tub shower transfer. Pt received sitting in w/c with wife present. Practiced tub transfer x3 using RW and TTB. Pt ambulating short distance with RW from ADL apartment to bathroom for each transfers. Wife providing assist 2/3 trials and required mod cueing to allow pt to complete more of task himself. Pt required min cueing and min assist initially from wife then progressed to supervision with min cueing mainly with turn changes. Min cueing also provided for hand positioning during sit<>stand. Pt and wife reported feeling very comfortable about transfer and have no questions or concerns. They demonstrated good  safety during each transfer. Returned to room and provided theraputty with beads for HEP to address RUE. Wife very eager for pt to practice this. Pt left with wife present and all needs in reach.    Therapy Documentation Precautions:  Precautions Precautions: Fall Precaution Comments: Lateral trunk lean to R; sensory deficits on R side Restrictions Weight Bearing Restrictions: No General:   Vital Signs: Therapy Vitals Pulse Rate: 98 Pain: Pain Assessment Pain Assessment: No/denies pain    Other Treatments:    See FIM for current functional status  Therapy/Group: Individual Therapy  Ashlee Player, Quillian Quince 01/29/2014, 11:11 AM

## 2014-01-29 NOTE — Progress Notes (Signed)
ANTICOAGULATION CONSULT NOTE - Follow Up Consult  Pharmacy Consult for Xarelto  Indication: atrial fibrillation  Allergies  Allergen Reactions  . Diazepam     REACTION: agitation  . Ezetimibe-Simvastatin     unknown  . Morphine Other (See Comments)    Hallucinations.  . Sulfonamide Derivatives Hives    Patient Measurements: Height: 6' 2.02" (188 cm) Weight: 125 lb 3.5 oz (56.8 kg) IBW/kg (Calculated) : 82.24  Vital Signs: Temp: 98.1 F (36.7 C) (02/03 0538) Temp src: Oral (02/03 0538) BP: 132/87 mmHg (02/03 0538) Pulse Rate: 98 (02/03 0908)  Labs:  Recent Labs  01/28/14 0920  CREATININE 1.02    Estimated Creatinine Clearance: 43.3 ml/min (by C-G formula based on Cr of 1.02).  Assessment: 72 yof continues on xarelto for afib. SCr = 1.02, CrCl ~ 40 - 45 ml/min.  Current dose of Xarelto is 15 mg daily which is appropriate for renal fxn. Recent hx of small amount of hematuria without clots on 1/13 (likely related to surgery for bladder cancer). H/H 10.3/30.7, plts 253 as of 1/23. No bleeding reported currently.  Goal of Therapy:  Therapeutic anticoagulation   Plan:  1. Continue xarelto 15mg  PO daily 2. Monitor for signs and symptoms of bleeding  Maryanna Shape, PharmD, BCPS  Clinical Pharmacist  Pager: [phone]   01/29/2014, 2:40 PM

## 2014-01-29 NOTE — Progress Notes (Signed)
80 y.o. 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 He was admitted 12/25/2013 with altered mental status and low grade fever of 100 Fahrenheit and reported hematuria. Cranial CT scan negative and MRI of the brain showed punctate acute infarct in the high left parietal lobe as well as extensive chronic ischemic changes. Patient's Xarelto held for a short time secondary to hematuria. He was started on IV rocephin due to SIRS as well as Tamiflu as he tested positive for type A influenza. He was placed on Dysphagia 1 nectar thick liquid diet per ST recommendations. Patient with difficulty with processing with easy distractibility, as well as problems with initiation. Needs tactile cues for basic tasks and mobility. Mentation improving and neurology recommends resuming Xarelto for embolic stroke due to A Fib  On 01/05/14, he was completing his shower when he developed acute onset of right sided weakness with speech difficulty. Code stroke was initiated and he was transferred to acute services. MRI 01/06/2014 revealed Left PCA distribution infarct  Subjective  Intake 50-100% Good fluid intake Off IVF Review of Systems - difficult to obtain secondary to aphasia and reduced attn Objective: Vital Signs: Blood pressure 132/87, pulse 113, temperature 98.1 F (36.7 C), temperature source Oral, resp. rate 18, height 6' 2.02" (1.88 m), weight 56.8 kg (125 lb 3.5 oz), SpO2 97.00%. No results found. Results for orders placed during the hospital encounter of 01/09/14 (from the past 72 hour(s))  BASIC METABOLIC PANEL     Status: Abnormal   Collection Time    01/28/14  9:20 AM      Result Value Range   Sodium 139  137 - 147 mEq/L   Potassium 4.3  3.7 - 5.3 mEq/L   Chloride 98  96 - 112 mEq/L   CO2 28  19 - 32 mEq/L   Glucose, Bld 135 (*) 70 - 99 mg/dL   BUN 25 (*) 6 - 23 mg/dL    Creatinine, Ser 1.02  0.50 - 1.35 mg/dL   Calcium 9.4  8.4 - 10.5 mg/dL   GFR calc non Af Amer 65 (*) >90 mL/min   GFR calc Af Amer 76 (*) >90 mL/min   Comment: (NOTE)     The eGFR has been calculated using the CKD EPI equation.     This calculation has not been validated in all clinical situations.     eGFR's persistently <90 mL/min signify possible Chronic Kidney     Disease.      Constitutional: He appears well-developed and well-nourished.  HENT:  Head: Normocephalic and atraumatic.  Eyes: Conjunctivae and EOM are normal. Pupils are equal, round, and reactive to light.  Neck: Normal range of motion. Neck supple.  Cardiovascular: Normal rate and normal heart sounds. An irregularly irregular rhythm present.  Respiratory: Effort normal. No respiratory distress. He has decreased breath sounds in the right lower field and the left lower field.  Neurological: He is fairly alert. Delayed processing, poor awareness. He exhibits normal muscle tone.  Motor strength is 3 minus right deltoid, bicep, tricep, grip, right ankle dorsiflexor 4 minus right hip flexor knee extensor Ataxia on Right side with reduced sensory 5/5 left deltoid, bicep, tricep, grip, hip flexors, knee extensors, ankle dorsiflexor plantar flexor   Assessment/Plan: 1. Functional deficits secondary to L PCA embolic infarct 0/93/2671 superimposed on L high parietal infarct onset 12/25/2013 which require 3+ hours  per day of interdisciplinary therapy in a comprehensive inpatient rehab setting. Physiatrist is providing close team supervision and 24 hour management of active medical problems listed below. Physiatrist and rehab team continue to assess barriers to discharge/monitor patient progress toward functional and medical goals.  FIM: FIM - Bathing Bathing Steps Patient Completed: Chest;Right Arm;Abdomen;Front perineal area;Left upper leg;Right upper leg;Left Arm;Right lower leg (including foot);Left lower leg (including  foot);Buttocks Bathing: 5: Supervision: Safety issues/verbal cues  FIM - Upper Body Dressing/Undressing Upper body dressing/undressing steps patient completed: Thread/unthread left sleeve of pullover shirt/dress;Put head through opening of pull over shirt/dress;Thread/unthread right sleeve of pullover shirt/dresss Upper body dressing/undressing: 4: Min-Patient completed 75 plus % of tasks FIM - Lower Body Dressing/Undressing Lower body dressing/undressing steps patient completed: Thread/unthread right underwear leg;Thread/unthread right pants leg;Thread/unthread left pants leg;Pull pants up/down Lower body dressing/undressing: 3: Mod-Patient completed 50-74% of tasks  FIM - Toileting Toileting steps completed by patient: Adjust clothing prior to toileting;Adjust clothing after toileting Toileting Assistive Devices: Grab bar or rail for support Toileting: 3: Mod-Patient completed 2 of 3 steps  FIM - Radio producer Devices: Grab bars;Walker Toilet Transfers: 4-To toilet/BSC: Min A (steadying Pt. > 75%);4-From toilet/BSC: Min A (steadying Pt. > 75%)  FIM - Control and instrumentation engineer Devices: Walker;Arm rests Bed/Chair Transfer: 4: Chair or W/C > Bed: Min A (steadying Pt. > 75%);4: Bed > Chair or W/C: Min A (steadying Pt. > 75%)  FIM - Locomotion: Wheelchair Distance: 135 Locomotion: Wheelchair: 1: Total Assistance/staff pushes wheelchair (Pt<25%) FIM - Locomotion: Ambulation Locomotion: Ambulation Assistive Devices: Administrator Ambulation/Gait Assistance: 4: Min guard Locomotion: Ambulation: 0: Activity did not occur  Comprehension Comprehension Mode: Auditory Comprehension: 4-Understands basic 75 - 89% of the time/requires cueing 10 - 24% of the time  Expression Expression Mode: Verbal Expression: 4-Expresses basic 75 - 89% of the time/requires cueing 10 - 24% of the time. Needs helper to occlude trach/needs to repeat  words.  Social Interaction Social Interaction: 4-Interacts appropriately 75 - 89% of the time - Needs redirection for appropriate language or to initiate interaction.  Problem Solving Problem Solving: 3-Solves basic 50 - 74% of the time/requires cueing 25 - 49% of the time  Memory Memory: 3-Recognizes or recalls 50 - 74% of the time/requires cueing 25 - 49% of the time   Medical Problem List and Plan:  1. DVT Prophylaxis/Anticoagulation: Pharmaceutical: Xarelto  2. Pain Management: tylenol  3. Mood: monitor for depression, encourage interaction  4. Neuropsych: This patient is not capable of making decisions on his own behalf.  5. Afib Xarelto , HR intermittently elevated at rest and with activity resume low dose dig, check level may need to increase 6. Resp- recent Influenza and hx COPD- IS,monitor  7.  Dysphagia improving. Fluid intake better recheck BUN/Creat off IVF,  8. Urinary frequency:UTI treated LOS (Days) 20 A FACE TO FACE EVALUATION WAS PERFORMED  [name] E 01/29/2014, 6:31 AM

## 2014-01-29 NOTE — Progress Notes (Signed)
Physical Therapy Session Note  Patient Details  Name: [name] MRN: [id number] Date of Birth: February 18, 1927  Today's Date: 01/29/2014 Time: 5621-3086 Time Calculation (min): 65 min  Short Term Goals: Week 3:  PT Short Term Goal 1 (Week 3): STG's=LTG's secondary to ELOS  Skilled Therapeutic Interventions/Progress Updates:    Pt received seated in w/c with wife and son present. Pt agreeable to therapy. Session focused on completing hands-on family education, increasing stability/independence with functional mobility. The following were performed with assistance of this PT, then by patient's son Lennette Bihari), then by wife: stand-pivot from bed<>chair with rolling walker and supervision; gait x50' in controlled environment with rolling walker and supervision; car transfer with rolling walker and supervision, verbal cueing; negotiation of 6 stairs with bilat UE's at L rail, sideways, step-to pattern with min guard; furniture transfer with supervision and rolling walker; and supine<>sit with supervision/cueing for initial trial, independent with second trial. Son and wife provided safe/appropriate assist, supervision, and cueing with all abovementioned aspects of functional mobility.   Patient, wife, son, and therapist engaged in interactive discussion of home setup, need for transport chair, and wife comfort providing assist/supervision with functional mobility at this time. Wife verbalized feeling comfortable with all transfers, bed mobility, and gait described above; she does express apprehension providing min guard with stair negotiation. Son therefore agreed to assist patient/wife in entering home for first time and social worker discussed home health PT for initial week after D/C to ensure wife comfort and safety assisting with stair negotiation. Transported pt to room in w/c, where session ended. Therapist departed with pt seated in w/c with wife and son present and all needs within reach.  Goals  addressing w/c mobility in community environment were discharged due to inapplicability, as pt will not be utilizing w/c for mobility after D/C.    Therapy Documentation Precautions:  Precautions Precautions: Fall Precaution Comments: Lateral trunk lean to R; sensory deficits on R side Restrictions Weight Bearing Restrictions: No Vital Signs: Therapy Vitals Pulse Rate: 98 Pain: Pain Assessment Pain Assessment: No/denies pain Pain Score: 0-No pain Locomotion : Ambulation Ambulation/Gait Assistance: 5: Supervision   See FIM for current functional status  Therapy/Group: Individual Therapy  [name], Malva Cogan 01/29/2014, 12:37 PM

## 2014-01-29 NOTE — Plan of Care (Signed)
Problem: RH Wheelchair Mobility Goal: LTG Patient will propel w/c in community environment (PT) LTG: Patient will propel wheelchair in community environment, # of feet with assist (PT)  Outcome: Not Applicable Date Met:  65/53/74 N/A, as pt will not be utilizing w/c for mobility after discharge.

## 2014-01-29 NOTE — Plan of Care (Signed)
Problem: RH Wheelchair Mobility Goal: LTG Patient will propel w/c in home environment (PT) LTG: Patient will propel wheelchair in home environment, # of feet with assistance (PT).  Outcome: Not Applicable Date Met:  71/16/57 N/A, as pt will not be utilizing w/c for mobility at discharge.

## 2014-01-29 NOTE — Plan of Care (Signed)
Problem: RH BLADDER ELIMINATION Goal: RH STG MANAGE BLADDER WITH ASSISTANCE STG Manage Bladder With Assistance- Min  Outcome: Not Progressing Incont. At times, wears condom cath. at night.

## 2014-01-30 ENCOUNTER — Inpatient Hospital Stay (HOSPITAL_COMMUNITY): Payer: Medicare Other

## 2014-01-30 ENCOUNTER — Inpatient Hospital Stay (HOSPITAL_COMMUNITY): Payer: Medicare Other | Admitting: Physical Therapy

## 2014-01-30 LAB — BASIC METABOLIC PANEL
BUN: 24 mg/dL — AB (ref 6–23)
CO2: 29 mEq/L (ref 19–32)
Calcium: 9 mg/dL (ref 8.4–10.5)
Chloride: 98 mEq/L (ref 96–112)
Creatinine, Ser: 1.02 mg/dL (ref 0.50–1.35)
GFR calc Af Amer: 76 mL/min — ABNORMAL LOW (ref >90)
GFR, EST NON AFRICAN AMERICAN: 65 mL/min — AB (ref >90)
Glucose, Bld: 89 mg/dL (ref 70–99)
Potassium: 4.6 mEq/L (ref 3.7–5.3)
Sodium: 137 mEq/L (ref 137–147)

## 2014-01-30 LAB — DIGOXIN LEVEL: Digoxin Level: <0.3 ng/mL — ABNORMAL LOW (ref 0.8–2.0)

## 2014-01-30 MED ORDER — CAMPHOR-MENTHOL 0.5-0.5 % EX LOTN
TOPICAL_LOTION | CUTANEOUS | Status: DC | PRN
  Filled 2014-01-30: qty 222

## 2014-01-30 MED ORDER — DILTIAZEM HCL 60 MG PO TABS: 60.0000 mg | ORAL_TABLET | Freq: Three times a day (TID) | ORAL | Status: DC

## 2014-01-30 MED ORDER — SENNOSIDES-DOCUSATE SODIUM 8.6-50 MG PO TABS: 2.0000 | ORAL_TABLET | Freq: Two times a day (BID) | ORAL | Status: DC

## 2014-01-30 MED ORDER — RESOURCE THICKENUP CLEAR PO POWD: 1.0000 g | ORAL | Status: DC | PRN

## 2014-01-30 MED ORDER — DIGOXIN 125 MCG PO TABS: 0.0625 mg | ORAL_TABLET | Freq: Every day | ORAL | Status: DC

## 2014-01-30 NOTE — Progress Notes (Signed)
Physical Therapy Discharge Summary  Patient Details  Name: [name] MRN: [id number] Date of Birth: 1927/06/07  Today's Date: 01/30/2014 Time: 1301-1400 Time Calculation (min): 59 min  Patient has met 14 of 14  long term goals due to improved activity tolerance, improved balance, improved postural control, increased strength, ability to compensate for deficits, functional use of  right upper extremity and right lower extremity, improved attention, improved awareness and improved coordination.  Patient to discharge at an ambulatory level Supervision.   Patient's care partner is independent to provide the necessary physical and cognitive assistance at discharge.  Reasons goals not met: N/A; all goals met or surpassed.  Recommendation:  Patient will benefit from ongoing skilled PT services in home health setting for initial week, followed by PT services in outpatient setting to continue to advance safe functional mobility, address ongoing impairments in stability/independence with functional mobility, and minimize fall risk.  Equipment: rolling walker  Reasons for discharge: treatment goals met and discharge from hospital  Patient/family agrees with progress made and goals achieved: Yes  Skilled Therapeutic Interventions/Progress Updates: Pt received seated on toilet with wife present. Performed toilet transfer with supervision, rolling walker. Self-propulsion of w/c x160' in controlled environment with bilat UE's and supervision. Negotiation of 5 stairs with bilat UE's at L rail, sideways, with step-to pattern and supervision and min guard. Transported pt to apartment bathroom, per pt request, where pt performed toilet transfer with supervision, rolling walker. See below for detailed description of sitting/standing balance assist/cueing. Performed sit<>stand from apartment couch to rolling walker with supervision and cueing for setup. Gait x170' in community environment with rolling walker and  supervision; no LOB. Transported pt to room, where session ended. Therapist departed with pt seated in w/c with wife present and all needs within reach.  PT Discharge Precautions/Restrictions Precautions Precautions: Fall Restrictions Weight Bearing Restrictions: No Vital Signs Therapy Vitals Temp: 98.2 F (36.8 C) Temp src: Oral Pulse Rate: 85 Resp: 18 BP: 102/66 mmHg Patient Position, if appropriate: Sitting Oxygen Therapy SpO2: 99 % O2 Device: None (Room air) Pain Pain Assessment Pain Assessment: No/denies pain Pain Score: 0-No pain Vision/Perception  Vision - History Baseline Vision: Wears glasses all the time Visual History: Cataracts Patient Visual Report: No change from baseline Vision - Assessment Eye Alignment: Within Functional Limits Perception Perception: Impaired Spatial Orientation: slight overshooting when reaching for objects; much improvement noted Praxis Praxis: Impaired Praxis Impairment Details: Motor planning  Cognition Overall Cognitive Status: Impaired/Different from baseline Orientation Level: Oriented X4 Attention: Sustained;Selective Sustained Attention: Appears intact Sustained Attention Impairment: Verbal basic;Functional basic Selective Attention: Impaired Selective Attention Impairment: Verbal basic;Functional basic Memory: Impaired Memory Impairment: Decreased recall of new information Awareness: Impaired Awareness Impairment: Anticipatory impairment Problem Solving: Impaired Safety/Judgment: Impaired Sensation Sensation Light Touch: Impaired by gross assessment Coordination Gross Motor Movements are Fluid and Coordinated: Yes Fine Motor Movements are Fluid and Coordinated: No Finger Nose Finger Test: pt continues to have slight ataxic movements in RUE (overshooting/undershooting) however noted improvement during rehab Heel Shin Test: Decreased speed with RLE (compared to LLE) but improved excursion since evaluation Motor   Motor Motor: Abnormal postural alignment and control  Mobility Bed Mobility Bed Mobility: Supine to Sit;Sit to Supine;Sitting - Scoot to Edge of Bed Supine to Sit: 7: Independent;5: Supervision Supine to Sit Details (indicate cue type and reason): Independent for majority of supine<>sit transfers; intermittently requires cueing to initiate transfer Sitting - Scoot to Edge of Bed: 7: Independent Sit to Supine: 7: Independent;5: Supervision Sit to Supine -  Details (indicate cue type and reason): See comment for supine to sit Transfers Transfers: Yes Sit to Stand: 5: Supervision Sit to Stand Details (indicate cue type and reason): Intermittently requires verbal reminder for safe hand placement, setup Stand to Sit: 5: Supervision Stand to Sit Details: Intermittently requires verbal reminder for safe hand placement Locomotion  Ambulation Ambulation: Yes Ambulation/Gait Assistance: 5: Supervision Ambulation Distance (Feet): 170 Feet Assistive device: Rolling walker Gait Gait: Yes Gait Pattern: Trunk flexed;Step-to pattern Gait velocity: WNL in controlled environment; decreased in home and community environments High Level Ambulation High Level Ambulation: Side stepping Side Stepping: Close supervision with rolling walker Stairs / Additional Locomotion Stairs: Yes Stairs Assistance: 4: Min guard Stair Management Technique: One rail Left;Sideways;Step to pattern Number of Stairs: 5 Wheelchair Mobility Wheelchair Mobility: Yes Wheelchair Assistance: 5: Supervision Wheelchair Propulsion: Both upper extremities Wheelchair Parts Management: Supervision/cueing;Needs assistance;Other (comment) (Assistance managing R w/c leg rest; supervision/cueing for management of all other w/c parts) Distance: 160  Trunk/Postural Assessment  Cervical Assessment Cervical Assessment: Within Functional Limits Thoracic Assessment Thoracic Assessment: Within Functional Limits Lumbar Assessment Lumbar  Assessment: Within Functional Limits Postural Control Righting Reactions: L ankle strategy inconsistent. No stepping strategy noted with balance perturbations in all directions. Postural Limitations: During standing/walking, pt able to self-correct postural alignment without cueing.  Balance Balance Balance Assessed: Yes Static Sitting Balance Static Sitting - Balance Support: Feet supported;No upper extremity supported Static Sitting - Level of Assistance: 7: Independent Static Sitting - Comment/# of Minutes: Seated EOB in apartment >3 minutes without LOB; independent. Dynamic Sitting Balance Dynamic Sitting - Balance Support: Feet supported;No upper extremity supported;Right upper extremity supported Dynamic Sitting - Level of Assistance: 6: Modified independent (Device/Increase time) Sitting balance - Comments: Seated on toilet, lateral leaning to L side to reach for toilet paper, anterior weight shift for hygiene with mod I for intermittent RUE support. Static Standing Balance Static Standing - Balance Support: No upper extremity supported;Bilateral upper extremity supported Static Standing - Level of Assistance: 6: Modified independent (Device/Increase time) Static Standing - Comment/# of Minutes: Static standing >2 minutes with bilat UE support at rolling walker with mod I; no LOB. Pt with upright posture without cueing/facilitation. Dynamic Standing Balance Dynamic Standing - Balance Support: No upper extremity supported;During functional activity Dynamic Standing - Level of Assistance: 5: Stand by assistance Dynamic Standing - Balance Activities: Lateral lean/weight shifting;Forward lean/weight shifting;Reaching for objects Dynamic Standing - Comments: Standing in front of sink to wash hands x2 miinutes; anterior/lateral weight shift (bilaterally) to reach for soap and paper towel with supervision. Extremity Assessment  RUE Assessment RUE Assessment: Within Functional Limits LUE  Assessment LUE Assessment: Within Functional Limits RLE Assessment RLE Assessment: Exceptions to [facility] RLE Strength RLE Overall Strength: Deficits RLE Overall Strength Comments: Grossly 4/5 in R hip/knee and R ankle plantarflexion; 3+/5 R ankle dorsiflexion. LLE Assessment LLE Assessment: Within Functional Limits  See FIM for current functional status  Cadence Haslam, Malva Cogan 01/30/2014, 4:29 PM

## 2014-01-30 NOTE — Progress Notes (Signed)
Occupational Therapy Discharge Summary  Patient Details  Name: [name] MRN: [id number] Date of Birth: 1927-09-04  Today's Date: 01/30/2014  Patient has met 5 of 12 long term goals due to improved activity tolerance, improved balance, postural control, ability to compensate for deficits, functional use of  RIGHT upper and RIGHT lower extremity and improved coordination.  Patient to discharge at overall min assist-supervision level.  Patient's care partner is independent to provide the necessary physical and cognitive assistance at discharge.  Patient's wife has participated in many hands on therapy sessions with focus on self-care tasks and functional transfers. She has consistently demonstrated the ability to safely provide the physical and cognitive assistance required to ensure a safe discharge home. Patient and wife with no questions or concerns about discharge at this time.   Reasons goals not met: Patient did not meet LTG of selective attention in controlled environment at Mod I level as patient requires cueing for selective attention.   Recommendation:  Patient will benefit from ongoing skilled OT services in home health setting for approx 1 week then transition to outpatient therapy to continue to advance functional skills in the area of self-care, functional use of RUE, dynamic standing balance, safety awareness.  Equipment: TTB  Reasons for discharge: treatment goals met and discharge from hospital  Patient/family agrees with progress made and goals achieved: Yes  OT Discharge Precautions/Restrictions  Precautions Precautions: Fall Restrictions Weight Bearing Restrictions: No General   Vital Signs   Pain Pain Assessment Pain Assessment: No/denies pain ADL   Vision/Perception  Vision - History Baseline Vision: Wears glasses all the time Visual History: Cataracts Patient Visual Report: No change from baseline Vision - Assessment Eye Alignment: Within Functional  Limits Perception Perception: Impaired Spatial Orientation: slight overshooting when reaching for objects; much improvement noted Praxis Praxis: Impaired Praxis Impairment Details: Motor planning  Cognition Overall Cognitive Status: Impaired/Different from baseline Arousal/Alertness: Awake/alert Orientation Level: Oriented X4 Attention: Sustained;Selective Sustained Attention: Appears intact Sustained Attention Impairment: Verbal basic;Functional basic Selective Attention: Impaired Selective Attention Impairment: Verbal basic;Functional basic Memory: Impaired Memory Impairment: Decreased recall of new information Awareness: Impaired Awareness Impairment: Anticipatory impairment Problem Solving: Impaired Problem Solving Impairment: Functional basic;Verbal basic Executive Function:  (impaired due to lower level cognitive deficits) Safety/Judgment: Impaired Sensation Sensation Light Touch: Impaired by gross assessment Coordination Gross Motor Movements are Fluid and Coordinated: Yes Fine Motor Movements are Fluid and Coordinated: No Finger Nose Finger Test: pt continues to have slight ataxic movements in RUE (overshooting/undershooting) however noted improvement during rehab Motor    Mobility     Trunk/Postural Assessment     Balance   Extremity/Trunk Assessment RUE Assessment RUE Assessment: Within Functional Limits LUE Assessment LUE Assessment: Within Functional Limits  See FIM for current functional status  Cindel Daugherty, Quillian Quince 01/30/2014, 12:25 PM

## 2014-01-30 NOTE — Progress Notes (Signed)
NUTRITION FOLLOW UP  DOCUMENTATION CODES  Per approved criteria   -Severe malnutrition in the context of chronic illness  -Underweight    Intervention:   1.  Supplements; conitnue Magic cup with meals and medications 2. Provide pt with additional nectar thick liquids on meal trays.   Nutrition Dx:   Increased nutrient needs, ongoing  Monitor:   1.  Food/Beverage; pt meeting >/=90% estimated needs with tolerance. 2.  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 He was admitted 12/25/2013 with altered mental status and low grade fever of 100 Fahrenheit and reported hematuria. Cranial CT scan negative and MRI of the brain showed punctate acute infarct in the high left parietal lobe as well as extensive chronic ischemic changes. He was placed on Dysphagia 1 nectar thick liquid diet per ST recommendations. Patient with difficulty with processing with easy distractibility, as well as problems with initiation. Needs tactile cues for basic tasks and mobility. On 01/05/14, he was completing his shower when he developed acute onset of right sided weakness with speech difficulty. Code stroke was initiated and he was transferred to acute services. MRI 01/06/2014 revealed Left PCA distribution infarct.  Pt's intake remains 75-100% of meals consistently. He has gained 2 lbs since admission.  His diet has been liberalized to Dysphagia 3, however continues on nectar thick liquids.   Pt states he is eating well and is getting more fluids.  He is currently on water precautions and is being offered fluids between meals.   Height: Ht Readings from Last 1 Encounters:  01/09/14 6' 2.02" (1.88 m)    Weight Status:   Wt Readings from Last 1 Encounters:  01/30/14 127 lb 13.9 oz (58 kg)    Re-estimated needs:  Kcal: 2000-2400  Protein: 115-130g/day  Fluid:  2-2.4L/day  Skin: intact  Diet Order: Dysphagia 2, nectar-thick Meal Completion: >75%   Intake/Output Summary (Last 24 hours) at 01/30/14 1437 Last data filed at 01/30/14 1300  Gross per 24 hour  Intake    840 ml  Output      0 ml  Net    840 ml    Last BM: 2/2  Labs:   Recent Labs Lab 01/28/14 0920 01/30/14 0525  NA 139 137  K 4.3 4.6  CL 98 98  CO2 28 29  BUN 25* 24*  CREATININE 1.02 1.02  CALCIUM 9.4 9.0  GLUCOSE 135* 89    CBG (last 3)  No results found for this basename: GLUCAP,  in the last 72 hours  Scheduled Meds: . antiseptic oral rinse  15 mL Mouth Rinse q12n4p  . chlorhexidine  15 mL Mouth Rinse BID  . digoxin  0.0625 mg Oral Daily  . diltiazem  60 mg Oral Q8H  . rivaroxaban  15 mg Oral Q supper  . senna-docusate  2 tablet Oral BID    Continuous Infusions:    Brynda Greathouse, MS RD LDN Clinical Inpatient Dietitian Pager: [phone] Weekend/After hours pager: [phone]

## 2014-01-30 NOTE — Progress Notes (Signed)
80 y.o. right handed male with history of COPD, prior CVA 08/2012 with right sided weakness and dysphagia ( CIR stay) , A fib--Xarelto and bladder cancer with ongoing treatment at [facility]. He was admitted 12/25/2013 with altered mental status and low grade fever of 100 Fahrenheit and reported hematuria. Cranial CT scan negative and MRI of the brain showed punctate acute infarct in the high left parietal lobe as well as extensive chronic ischemic changes. Patient's Xarelto held for a short time secondary to hematuria. He was started on IV rocephin due to SIRS as well as Tamiflu as he tested positive for type A influenza. He was placed on Dysphagia 1 nectar thick liquid diet per ST recommendations. Patient with difficulty with processing with easy distractibility, as well as problems with initiation. Needs tactile cues for basic tasks and mobility. Mentation improving and neurology recommends resuming Xarelto for embolic stroke due to A Fib  On 01/05/14, he was completing his shower when he developed acute onset of right sided weakness with speech difficulty. Code stroke was initiated and he was transferred to acute services. MRI 01/06/2014 revealed Left PCA distribution infarct  Subjective No new issues overnite, some constipation Review of Systems - difficult to obtain secondary to aphasia and reduced attn Objective: Vital Signs: Blood pressure 121/73, pulse 100, temperature 98.3 F (36.8 C), temperature source Oral, resp. rate 18, height 6' 2.02" (1.88 m), weight 58 kg (127 lb 13.9 oz), SpO2 96.00%. No results found. Results for orders placed during the hospital encounter of 01/09/14 (from the past 72 hour(s))  BASIC METABOLIC PANEL     Status: Abnormal   Collection Time    01/28/14  9:20 AM      Result Value Range   Sodium 139  137 - 147 mEq/L   Potassium 4.3  3.7 - 5.3 mEq/L   Chloride 98  96 - 112 mEq/L   CO2 28  19 - 32 mEq/L   Glucose, Bld 135 (*) 70 - 99 mg/dL   BUN 25 (*) 6 - 23 mg/dL   Creatinine, Ser 1.02  0.50 - 1.35 mg/dL   Calcium 9.4  8.4 - 10.5 mg/dL   GFR calc non Af Amer 65 (*) >90 mL/min   GFR calc Af Amer 76 (*) >90 mL/min   Comment: (NOTE)     The eGFR has been calculated using the CKD EPI equation.     This calculation has not been validated in all clinical situations.     eGFR's persistently <90 mL/min signify possible Chronic Kidney     Disease.  DIGOXIN LEVEL     Status: Abnormal   Collection Time    01/30/14  5:25 AM      Result Value Range   Digoxin Level <0.3 (*) 0.8 - 2.0 ng/mL  BASIC METABOLIC PANEL     Status: Abnormal   Collection Time    01/30/14  5:25 AM      Result Value Range   Sodium 137  137 - 147 mEq/L   Potassium 4.6  3.7 - 5.3 mEq/L   Chloride 98  96 - 112 mEq/L   CO2 29  19 - 32 mEq/L   Glucose, Bld 89  70 - 99 mg/dL   BUN 24 (*) 6 - 23 mg/dL   Creatinine, Ser 1.02  0.50 - 1.35 mg/dL   Calcium 9.0  8.4 - 10.5 mg/dL   GFR calc non Af Amer 65 (*) >90 mL/min   GFR calc Af Amer 76 (*) >90  mL/min   Comment: (NOTE)     The eGFR has been calculated using the CKD EPI equation.     This calculation has not been validated in all clinical situations.     eGFR's persistently <90 mL/min signify possible Chronic Kidney     Disease.      Constitutional: He appears well-developed and well-nourished.  HENT:  Head: Normocephalic and atraumatic.  Eyes: Conjunctivae and EOM are normal. Pupils are equal, round, and reactive to light.  Neck: Normal range of motion. Neck supple.  Cardiovascular: Normal rate and normal heart sounds. An irregularly irregular rhythm present.  Respiratory: Effort normal. No respiratory distress. He has decreased breath sounds in the right lower field and the left lower field.  Neurological: He is fairly alert. Delayed processing, poor awareness. He exhibits normal muscle tone.  Motor strength is 4 minus right deltoid, bicep, tricep, grip, right ankle dorsiflexor 4 minus right hip flexor knee extensor Ataxia on  Right side with reduced sensory 5/5 left deltoid, bicep, tricep, grip, hip flexors, knee extensors, ankle dorsiflexor plantar flexor   Assessment/Plan: 1. Functional deficits secondary to L PCA embolic infarct 04/04/8118 superimposed on L high parietal infarct onset 12/25/2013 which require 3+ hours per day of interdisciplinary therapy in a comprehensive inpatient rehab setting. Physiatrist is providing close team supervision and 24 hour management of active medical problems listed below. Physiatrist and rehab team continue to assess barriers to discharge/monitor patient progress toward functional and medical goals. Team conference today please see physician documentation under team conference tab, met with team face-to-face to discuss problems,progress, and goals. Formulized individual treatment plan based on medical history, underlying problem and comorbidities. FIM: FIM - Bathing Bathing Steps Patient Completed: Chest;Right Arm;Abdomen;Front perineal area;Left upper leg;Right upper leg;Left Arm;Right lower leg (including foot);Left lower leg (including foot);Buttocks Bathing: 5: Supervision: Safety issues/verbal cues  FIM - Upper Body Dressing/Undressing Upper body dressing/undressing steps patient completed: Thread/unthread left sleeve of pullover shirt/dress;Put head through opening of pull over shirt/dress;Thread/unthread right sleeve of pullover shirt/dresss Upper body dressing/undressing: 4: Min-Patient completed 75 plus % of tasks FIM - Lower Body Dressing/Undressing Lower body dressing/undressing steps patient completed: Thread/unthread right underwear leg;Thread/unthread right pants leg;Thread/unthread left pants leg;Pull pants up/down;Thread/unthread left underwear leg;Don/Doff right sock;Don/Doff left sock;Don/Doff left shoe Lower body dressing/undressing: 3: Mod-Patient completed 50-74% of tasks  FIM - Toileting Toileting steps completed by patient: Adjust clothing prior to  toileting;Adjust clothing after toileting Toileting Assistive Devices: Grab bar or rail for support Toileting: 3: Mod-Patient completed 2 of 3 steps  FIM - Radio producer Devices: Grab bars;Walker Toilet Transfers: 4-To toilet/BSC: Min A (steadying Pt. > 75%);4-From toilet/BSC: Min A (steadying Pt. > 75%)  FIM - Control and instrumentation engineer Devices: Walker;Arm rests Bed/Chair Transfer: 5: Sit > Supine: Supervision (verbal cues/safety issues);5: Supine > Sit: Supervision (verbal cues/safety issues);5: Chair or W/C > Bed: Supervision (verbal cues/safety issues);5: Bed > Chair or W/C: Supervision (verbal cues/safety issues)  FIM - Locomotion: Wheelchair Distance: 135 Locomotion: Wheelchair: 1: Total Assistance/staff pushes wheelchair (Pt<25%) FIM - Locomotion: Ambulation Locomotion: Ambulation Assistive Devices: Administrator Ambulation/Gait Assistance: 5: Supervision Locomotion: Ambulation: 2: Travels 50 - 149 ft with supervision/safety issues  Comprehension Comprehension Mode: Auditory Comprehension: 4-Understands basic 75 - 89% of the time/requires cueing 10 - 24% of the time  Expression Expression Mode: Verbal Expression: 4-Expresses basic 75 - 89% of the time/requires cueing 10 - 24% of the time. Needs helper to occlude trach/needs to repeat words.  Social  Interaction Social Interaction: 4-Interacts appropriately 75 - 89% of the time - Needs redirection for appropriate language or to initiate interaction.  Problem Solving Problem Solving: 4-Solves basic 75 - 89% of the time/requires cueing 10 - 24% of the time  Memory Memory: 3-Recognizes or recalls 50 - 74% of the time/requires cueing 25 - 49% of the time   Medical Problem List and Plan:  1. DVT Prophylaxis/Anticoagulation: Pharmaceutical: Xarelto  2. Pain Management: tylenol  3. Mood: monitor for depression, encourage interaction  4. Neuropsych: This patient is not  capable of making decisions on his own behalf.  5. Afib Xarelto , HR intermittently elevated at rest and with activity resume low dose dig, check level may need to increase 6. Resp- recent Influenza and hx COPD- IS,monitor  7.  Dysphagia improving. Fluid intake better recheck BUN/Creat off IVF,  8. Urinary frequency:UTI treated LOS (Days) 21 A FACE TO FACE EVALUATION WAS PERFORMED  Tija Biss E 01/30/2014, 7:45 AM

## 2014-01-30 NOTE — Discharge Instructions (Signed)
Inpatient Rehab Discharge Instructions  [name] Discharge date and time:  01/31/14  Activities/Precautions/ Functional Status: Activity: activity as tolerated with assistance Diet: regular diet Water protocol. Nectar liquids with meals Wound Care: none needed  Functional status:  ___ No restrictions     ___ Walk up steps independently _X__ 24/7 supervision/assistance   ___ Walk up steps with assistance ___ Intermittent supervision/assistance  ___ Bathe/dress independently ___ Walk with walker     _X__ Bathe/dress with assistance ___ Walk Independently    ___ Shower independently ___ Walk with assistance    ___ Shower with assistance _X__ No alcohol     ___ Return to work/school ________  COMMUNITY REFERRALS UPON DISCHARGE:   Home Health:   PT      OT      ST        Agency:  [facility] Phone:  [phone] Medical Equipment/Items Ordered:  Rolling walker and tub bench  Agency/Supplier:  [facility] Other:  Mrs. Belsito is privately purchasing a transport chair to use, as well as condom catheters.  Use the list of medical supply stores Sonia Baller gave to you.  GENERAL COMMUNITY RESOURCES FOR PATIENT/FAMILY: Support Groups:  [employer] Stroke Support Group  Special Instructions:  STROKE/TIA DISCHARGE INSTRUCTIONS SMOKING Cigarette smoking nearly doubles your risk of having a stroke & is the single most alterable risk factor  If you smoke or have smoked in the last 12 months, you are advised to quit smoking for your health.  Most of the excess cardiovascular risk related to smoking disappears within a year of stopping.  Ask you doctor about anti-smoking medications  [place] Quit Line: 1-800-QUIT NOW  Free Smoking Cessation Classes [phone]  CHOLESTEROL Know your levels; limit fat & cholesterol in your diet  Lipid Panel     Component Value Date/Time   CHOL 107 12/29/2013 0521   TRIG 53 12/29/2013 0521   HDL 49 12/29/2013 0521   CHOLHDL 2.2 12/29/2013 0521   VLDL 11  12/29/2013 0521   LDLCALC 47 12/29/2013 0521      Many patients benefit from treatment even if their cholesterol is at goal.  Goal: Total Cholesterol (CHOL) less than 160  Goal:  Triglycerides (TRIG) less than 150  Goal:  HDL greater than 40  Goal:  LDL (LDLCALC) less than 100   BLOOD PRESSURE American Stroke Association blood pressure target is less that 120/80 mm/Hg  Your discharge blood pressure is:  BP: 132/87 mmHg  Monitor your blood pressure  Limit your salt and alcohol intake  Many individuals will require more than one medication for high blood pressure  DIABETES (A1c is a blood sugar average for last 3 months) Goal HGBA1c is under 7% (HBGA1c is blood sugar average for last 3 months)  Diabetes:     Lab Results  Component Value Date   HGBA1C 5.8* 12/29/2013     Your HGBA1c can be lowered with medications, healthy diet, and exercise.  Check your blood sugar as directed by your physician  Call your physician if you experience unexplained or low blood sugars.  PHYSICAL ACTIVITY/REHABILITATION Goal is 30 minutes at least 4 days per week  Activity: No driving, Therapies: See above Return to work: N/A  Activity decreases your risk of heart attack and stroke and makes your heart stronger.  It helps control your weight and blood pressure; helps you relax and can improve your mood.  Participate in a regular exercise program.  Talk with your doctor about  the best form of exercise for you (dancing, walking, swimming, cycling).  DIET/WEIGHT Goal is to maintain a healthy weight  Your discharge diet is: Dysphagia nectar liquids Your height is:  Height: 6' 2.02" (188 cm) Your current weight is: Weight: 56.8 kg (125 lb 3.5 oz) Your Body Mass Index (BMI) is:  BMI (Calculated): 16.1  Following the type of diet specifically designed for you will help prevent another stroke.  Your goal weight is: 194 lbs  Your goal Body Mass Index (BMI) is 19-24.  Healthy food habits can help  reduce 3 risk factors for stroke:  High cholesterol, hypertension, and excess weight.  RESOURCES Stroke/Support Group:  Call [phone]   STROKE EDUCATION PROVIDED/REVIEWED AND GIVEN TO PATIENT Stroke warning signs and symptoms How to activate emergency medical system (call 911). Medications prescribed at discharge. Need for follow-up after discharge. Personal risk factors for stroke. Pneumonia vaccine given:  Flu vaccine given:  My questions have been answered, the writing is legible, and I understand these instructions.  I will adhere to these goals & educational materials that have been provided to me after my discharge from the hospital.      My questions have been answered and I understand these instructions. I will adhere to these goals and the provided educational materials after my discharge from the hospital.  Patient/Caregiver Signature _______________________________ Date __________  Clinician Signature _______________________________________ Date __________  Please bring this form and your medication list with you to all your follow-up doctor's appointments.

## 2014-01-30 NOTE — Progress Notes (Signed)
Occupational Therapy Session Note  Patient Details  Name: [name] MRN: [id number] Date of Birth: 04/01/1927  Today's Date: 01/30/2014 Time: 2694-8546  And 2703-5009  Time Calculation (min): 57 min and 35 min   Short Term Goals: Week 3:  OT Short Term Goal 1 (Week 3): Focus on LTGs  Skilled Therapeutic Interventions/Progress Updates:    Session 1: Pt seen for ADL retraining with focus on functional transfers, functional use of RUE, and standing balance. Pt's wife present during therapy session and assisting with transfers and toilet task. Pt completed all transfers at supervision level and min cueing for safety and technique to ensure pt positioned properly before descending. Pt with min cueing initially for hand placement during sit<>stand. Pt used RUE approx 20% of bathing task and 50% of oral care. Pt improving coordination as he was able to manipulate toothbrush in hand 2x. Min cueing provided to slow movements with RUE and to visually attend RUE to ensure having grip prior to completing movement with hand. Pt with supervision for standing balance during all self care tasks. Pt able to complete 1st step in tying shoelaces with increased time and required assist to tie bow. Provided elastic shoe strings. At end of session pt left in w/c with wife present and no questions or concerns at this time.   Session 2: Pt seen for 1:1 OT session with focus on NMR to RUE, activity tolerance, and functional transfers. Pt received sitting in w/c with wife present. Ambulated from room to ADL apartment with RW at supervision level with min cueing for slowing pace and safety. Completed tub transfer x2 trials at supervision level. Pt engaged in 9 hole peg test with LUE (1:22). Pt with difficulty picking up pegs with R hand when they were placed on dycem. Required assist to hold peg upright for 5/9 pegs and mod cueing to visually attend to hand when moving peg from dycem to board. Pt with improved sustained pincer  grip during activity and improved success when removing all pegs without assist. Returned to room and discussed setup of DME as it was delivered while pt in therapy. Pt and wife with no questions or concerns about d/c at this time. Discussed HEP with beads in putty and wife reported they completed last night and plan to do it again this afternoon. Pt left in w/c with all needs in reach.   Therapy Documentation Precautions:  Precautions Precautions: Fall Precaution Comments: Lateral trunk lean to R; sensory deficits on R side Restrictions Weight Bearing Restrictions: No General:   Vital Signs: Therapy Vitals Pulse Rate: 69 Pain: No report of pain during therapy sessions.   Other Treatments:    See FIM for current functional status  Therapy/Group: Individual Therapy  Duayne Cal 01/30/2014, 10:55 AM

## 2014-01-30 NOTE — Plan of Care (Signed)
Problem: RH Attention Goal: LTG Patient will demonstrate focused/sustained (OT) LTG: Patient will demonstrate focused/sustained/selective/alternating/divided attention during functional activities in specific environment with assist for # of minutes (OT)  Outcome: Not Met (add Reason) Requires cueing for selective attention.

## 2014-01-30 NOTE — Progress Notes (Signed)
Speech Language Pathology Discharge Summary & Final Treatment Note  Patient Details  Name: [name] MRN: [id number] Date of Birth: [birth year]  Today's Date: 01/30/2014 Time: 0413-6438 Time Calculation (min): 47 min  Skilled Therapeutic Interventions:  Skilled treatment focused on education with patient and wife. SLP facilitated session with education regarding current speech, swallowing, and cognitive-linguistic functioning. Pt consumed breakfast meal consisting of Dys 3 textures and nectar thick liquids with throat clear x1, which appeared strong enough to clear possible penetrates. He utilized his safe swallowing strategies with supervision level cueing. Pt was oriented x4 with Mod I and completed confrontational naming task with Min cues. Handouts were provided for education, and pt/wife were engaged and asking questions. They both verbalized their understanding of the information provided.   Patient has met 9 of 9 long term goals.  Patient to discharge at [facility] level.  Reasons goals not met: N/A   Clinical Impression/Discharge Summary: Pt has met 9 out of 9 LTGs during this admission, demonstrating functional gains in swallowing, speech, and cognitive-linguistic function. Pt is tolerating Dys 3 textures and nectar thick liquids and water between meals per the water protocol with Min cues for use of safe swallowing strategies. Pt requires Min cues for basic problem solving and Mod cues for working memory. He requires Min cues to increase his vocal intensity to increase speech intelligibility and to use word-finding strategies at the conversational level. Education completed with pt and wife, who has been present throughout most therapy sessions. Pt is scheduled to d/c home tomorrow with wife to provide 24/7 supervision. [facility] SLP services are recommended to further maximize swallowing safety, functional communication, and functional independence in order to decrease caregiver burden.  Care  Partner:  Caregiver Able to Provide Assistance: Yes  Type of Caregiver Assistance: Cognitive  Recommendation:  Home Health SLP;24 hour supervision/assistance  Rationale for SLP Follow Up: Maximize functional communication;Maximize cognitive function and independence;Maximize swallowing safety;Reduce caregiver burden   Equipment:  None recommended by SLP  Reasons for discharge: Treatment goals met;Discharged from hospital   Patient/Family Agrees with Progress Made and Goals Achieved: Yes   See FIM for current functional status   Germain Osgood, M.A. CCC-SLP [phone]   Germain Osgood 01/30/2014, 12:27 PM

## 2014-01-31 MED ORDER — DIGOXIN 125 MCG PO TABS
0.1250 mg | ORAL_TABLET | Freq: Every day | ORAL | Status: DC
  Administered 2014-01-31: 0.125 mg via ORAL
  Filled 2014-01-31 (×3): qty 1

## 2014-01-31 NOTE — Progress Notes (Signed)
Social Work Patient ID: [name], male   DOB: 1927/11/02, 80 y.o.   MRN: [id number]  CSW met with pt and his wife to update them on team conference.  Team continues to feel pt is ready for d/c on 01-31-14.  Pt and wife are pleased with this, as pt has been in the hospital for 38 days.  CSW reviewed with pt and wife what is set up for him at home.  Pt will receive home health for PT, OT, and ST for a few weeks and then when he is able, they will transfer to [facility].  CSW already faxed this order to them so it would be ready for pt when he is ready.  CSW also priced transport chairs and condom catheters for pt at local medical supply stores.  Pt's wife plans to go purchase a chair and will look into where to obtain catheters.  CSW also gave her brochures for mail order catheter supplies.  Wife was quite Patent attorney.  Discussed stroke support group with them, as well.  Pt's son and dtr-in-law will be at pt's home day of d/c to assist pt and wife with getting home and settled.  Pt's wife expressed appreciation to all of staff on rehab and was grateful for neuropsychology referral, as well.  CSW encouraged them to call if they need anything when they return home and pt's wife stated she would, as needed.

## 2014-01-31 NOTE — Progress Notes (Signed)
80 y.o. 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 He was admitted 12/25/2013 with altered mental status and low grade fever of 100 Fahrenheit and reported hematuria. Cranial CT scan negative and MRI of the brain showed punctate acute infarct in the high left parietal lobe as well as extensive chronic ischemic changes. Patient's Xarelto held for a short time secondary to hematuria. He was started on IV rocephin due to SIRS as well as Tamiflu as he tested positive for type A influenza. He was placed on Dysphagia 1 nectar thick liquid diet per ST recommendations. Patient with difficulty with processing with easy distractibility, as well as problems with initiation. Needs tactile cues for basic tasks and mobility. Mentation improving and neurology recommends resuming Xarelto for embolic stroke due to A Fib  On 01/05/14, he was completing his shower when he developed acute onset of right sided weakness with speech difficulty. Code stroke was initiated and he was transferred to acute services. MRI 01/06/2014 revealed Left PCA distribution infarct  Subjective No new issues overnite, some constipation Review of Systems - difficult to obtain secondary to aphasia and reduced attn Objective: Vital Signs: Blood pressure 117/64, pulse 105, temperature 97.5 F (36.4 C), temperature source Oral, resp. rate 16, height 6' 2.02" (1.88 m), weight 58 kg (127 lb 13.9 oz), SpO2 99.00%. No results found. Results for orders placed during the hospital encounter of 01/09/14 (from the past 72 hour(s))  BASIC METABOLIC PANEL     Status: Abnormal   Collection Time    01/28/14  9:20 AM      Result Value Range   Sodium 139  137 - 147 mEq/L   Potassium 4.3  3.7 - 5.3 mEq/L   Chloride 98  96 - 112 mEq/L   CO2 28  19 - 32 mEq/L   Glucose, Bld 135 (*) 70 - 99 mg/dL   BUN 25 (*) 6 - 23 mg/dL   Creatinine, Ser 1.02  0.50 - 1.35 mg/dL   Calcium 9.4  8.4 - 10.5 mg/dL   GFR calc non Af Amer 65 (*) >90 mL/min   GFR calc Af Amer 76 (*) >90 mL/min   Comment: (NOTE)     The eGFR has been calculated using the CKD EPI equation.     This calculation has not been validated in all clinical situations.     eGFR's persistently <90 mL/min signify possible Chronic Kidney     Disease.  DIGOXIN LEVEL     Status: Abnormal   Collection Time    01/30/14  5:25 AM      Result Value Range   Digoxin Level <0.3 (*) 0.8 - 2.0 ng/mL  BASIC METABOLIC PANEL     Status: Abnormal   Collection Time    01/30/14  5:25 AM      Result Value Range   Sodium 137  137 - 147 mEq/L   Potassium 4.6  3.7 - 5.3 mEq/L   Chloride 98  96 - 112 mEq/L   CO2 29  19 - 32 mEq/L   Glucose, Bld 89  70 - 99 mg/dL   BUN 24 (*) 6 - 23 mg/dL   Creatinine, Ser 1.02  0.50 - 1.35 mg/dL   Calcium 9.0  8.4 - 10.5 mg/dL   GFR calc non Af Amer 65 (*) >90 mL/min   GFR calc Af Amer 76 (*) >90  mL/min   Comment: (NOTE)     The eGFR has been calculated using the CKD EPI equation.     This calculation has not been validated in all clinical situations.     eGFR's persistently <90 mL/min signify possible Chronic Kidney     Disease.      Constitutional: He appears well-developed and well-nourished.  HENT:  Head: Normocephalic and atraumatic.  Eyes: Conjunctivae and EOM are normal. Pupils are equal, round, and reactive to light.  Neck: Normal range of motion. Neck supple.  Cardiovascular: Normal rate and normal heart sounds. An irregularly irregular rhythm present.  Respiratory: Effort normal. No respiratory distress. He has decreased breath sounds in the right lower field and the left lower field.  Neurological: He is fairly alert. Delayed processing, poor awareness. He exhibits normal muscle tone.  Motor strength is 4 minus right deltoid, bicep, tricep, grip, right ankle dorsiflexor 4 minus right hip flexor knee extensor Ataxia on  Right side with reduced sensory 5/5 left deltoid, bicep, tricep, grip, hip flexors, knee extensors, ankle dorsiflexor plantar flexor   Assessment/Plan: 1. Functional deficits secondary to L PCA embolic infarct 4/40/1027 superimposed on L high parietal infarct onset 12/25/2013  Stable for D/C today F/u PCP in 1-2 weeks F/u cardiology F/u PM&R 3 weeks See D/C summary See D/C instructions FIM: FIM - Bathing Bathing Steps Patient Completed: Chest;Right Arm;Abdomen;Front perineal area;Left upper leg;Right upper leg;Left Arm;Right lower leg (including foot);Left lower leg (including foot);Buttocks Bathing: 5: Supervision: Safety issues/verbal cues  FIM - Upper Body Dressing/Undressing Upper body dressing/undressing steps patient completed: Thread/unthread left sleeve of pullover shirt/dress;Put head through opening of pull over shirt/dress;Thread/unthread right sleeve of pullover shirt/dresss Upper body dressing/undressing: 4: Min-Patient completed 75 plus % of tasks FIM - Lower Body Dressing/Undressing Lower body dressing/undressing steps patient completed: Thread/unthread right underwear leg;Thread/unthread right pants leg;Thread/unthread left pants leg;Pull pants up/down;Thread/unthread left underwear leg;Don/Doff right sock;Don/Doff left sock;Don/Doff left shoe;Don/Doff right shoe;Pull underwear up/down Lower body dressing/undressing: 4: Min-Patient completed 75 plus % of tasks  FIM - Toileting Toileting steps completed by patient: Adjust clothing prior to toileting;Adjust clothing after toileting Toileting Assistive Devices: Grab bar or rail for support Toileting: 3: Mod-Patient completed 2 of 3 steps  FIM - Radio producer Devices: Grab bars;Walker Toilet Transfers: 5-To toilet/BSC: Supervision (verbal cues/safety issues);5-From toilet/BSC: Supervision (verbal cues/safety issues)  FIM - Control and instrumentation engineer Devices: Walker;Arm  rests Bed/Chair Transfer: 5: Chair or W/C > Bed: Supervision (verbal cues/safety issues);5: Bed > Chair or W/C: Supervision (verbal cues/safety issues)  FIM - Locomotion: Wheelchair Distance: 160 Locomotion: Wheelchair: 5: Travels 150 ft or more: maneuvers on rugs and over door sills with supervision, cueing or coaxing FIM - Locomotion: Ambulation Locomotion: Ambulation Assistive Devices: Administrator Ambulation/Gait Assistance: 5: Supervision Locomotion: Ambulation: 5: Travels 150 ft or more with supervision/safety issues  Comprehension Comprehension Mode: Auditory Comprehension: 4-Understands basic 75 - 89% of the time/requires cueing 10 - 24% of the time  Expression Expression Mode: Verbal Expression: 4-Expresses basic 75 - 89% of the time/requires cueing 10 - 24% of the time. Needs helper to occlude trach/needs to repeat words.  Social Interaction Social Interaction: 5-Interacts appropriately 90% of the time - Needs monitoring or encouragement for participation or interaction.  Problem Solving Problem Solving: 4-Solves basic 75 - 89% of the time/requires cueing 10 - 24% of the time  Memory Memory: 3-Recognizes or recalls 50 - 74% of the time/requires cueing 25 - 49% of the time  Medical Problem List and Plan:  1. DVT Prophylaxis/Anticoagulation: Pharmaceutical: Xarelto  2. Pain Management: tylenol  3. Mood: monitor for depression, encourage interaction  4. Neuropsych: This patient is not capable of making decisions on his own behalf.  5. Afib Xarelto , HR intermittently elevated at rest and with activity low dig  level  Increase dose, f/u cardiology 6. Resp- recent Influenza and hx COPD- IS,monitor  7.  Dysphagia improving. Wife to enc fluids at home   LOS (Days) 22 A FACE TO FACE EVALUATION WAS PERFORMED  Danylle Ouk E 01/31/2014, 6:25 AM

## 2014-01-31 NOTE — Progress Notes (Signed)
Social Work Discharge Note  The overall goal for the admission was met for:   Discharge location: Yes - home  Length of Stay: Yes - 22 days  Discharge activity level: Yes - supervision with some minimal assistance  Home/community participation: Yes  Services provided included: MD, RD, PT, OT, SLP, RN, TR, Pharmacy, Neuropsych and SW  Financial Services: Other: [employer] Medicare  Follow-up services arranged: Home Health: PT, OT, ST, DME: rolling walker and tub bench and Patient/Family request agency HH: [facility], DME: [place] [facility]  Comments (or additional information):  Patient/Family verbalized understanding of follow-up arrangements: Yes  Individual responsible for coordination of the follow-up plan: pt's wife  Confirmed correct DME delivered: Trey Sailors 01/31/2014    Nomie Buchberger, [name]

## 2014-01-31 NOTE — Patient Care Conference (Signed)
Inpatient RehabilitationTeam Conference and Plan of Care Update Date: 01/30/2014   Time: 10:45 AM    Patient Name: [name]      Medical Record Number: [id number]  Date of Birth: [birth year] Sex: Male         Room/Bed: 4W08C/4W08C-01 Payor Info: Payor: [employer] MEDICARE / Plan: AARP MEDICARE COMPLETE / Product Type: *No Product type* /    Admitting Diagnosis: Deconditioned  Admit Date/Time:  01/09/2014  6:47 PM Admission Comments: No comment available   Primary Diagnosis:  CVA (cerebral infarction) Principal Problem: CVA (cerebral infarction)  Patient Active Problem List   Diagnosis Date Noted  . Acute CVA (cerebrovascular accident) 01/07/2014  . Altered mental status 01/05/2014  . Influenza with other respiratory manifestations 01/01/2014  . Prediabetes 01/01/2014  . CVA (cerebral infarction) 01/01/2014  . Atrial fibrillation 12/25/2013  . Hypothermia 03/23/2013  . Constipation 10/16/2012  . Preventative health care 09/17/2012  . Nocturia 09/17/2012  . UTI (lower urinary tract infection) 08/22/2012  . Leukocytosis 08/22/2012  . Pseudogout 08/22/2012  . Encephalopathy 08/21/2012  . Left knee pain 08/21/2012  . Prerenal azotemia 08/21/2012  . CVA (cerebral vascular accident)-Acute right small nonhemorrhagic infarct seen on MRI 08/17/2012  . Lacunar stroke 04/07/2011  . Pleural effusion 04/07/2011  . 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 Long term current use of anticoagulant 04/07/2011  . NEOPLASM, MALIGNANT, BLADDER 04/10/2009  . HYPERLIPIDEMIA 04/10/2009  . ANEMIA-NOS 04/10/2009  . 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 ANXIETY 04/10/2009  . HYPERTENSION 04/10/2009  . COPD 04/10/2009  . TRANSIENT ISCHEMIC ATTACK, HX OF 04/10/2009    Expected Discharge Date: Expected Discharge Date: 01/31/14  Team Members Present: Physician leading conference: Dr. Alysia Penna Social Worker Present: Alfonse Alpers, LCSW Nurse Present: Other (comment) Lynett Fish,  RN) PT Present: Georjean Mode, PT;Other (comment) Benjie Karvonen [name], PT) OT Present: Gareth Morgan, Lorelee Cover, OT SLP Present: Germain Osgood, SLP PPS Coordinator present : Ileana Ladd, Lelan Pons, RN, CRRN     Current Status/Progress Goal Weekly Team Focus  Medical   hydration status improved  maintain hydration  family training   Bowel/Bladder   continent of bowel LBM 2/2. Condom cath at [facility] prior to admission. Wears brief during day occasional incontinence  Manage B&B with minimal assist  Continue with timed toileting during day   Swallow/Nutrition/ Hydration   supervision-Min with Dys 3 textures and nectar thick liquids, water protocol  Min  at goal level, focus on tolerance and education   ADL's   min -supervision transfers, bathing, dressing (wife tends to assist more then needed), mod assist toilet task  supervision-min assist  NMR, family training/education, activity tolerance, functional transfers, postural control in standing   Mobility   Supervision with transfers; supervision-min a with gait; min A with stairs  Supervision with transfers and gait; min A with stairs  Gait in community environment; postural/gait stability; complete hands-on family training/education   Communication   Min cues for vocal intensity and word-finding at the conversational level  Min  at goal level, focus on education   Safety/Cognition/ Behavioral Observations  Min basic problem solving, supervision for awareness and orientation, Mod cues for recall  Min  at goal level, focus on education   Pain   No complaints of pain   Monitor for signs of pain. Less than 3  Assess for pain, and offer pain medication pror to therapy session   Skin   clean, and intact  No skin breakdown/infection  assess q shift    Rehab Goals  Patient on target to meet rehab goals: Yes Rehab Goals Revised: None *See Care Plan and progress notes for long and short-term goals.  Barriers to Discharge: needs  final family training on steps    Possible Resolutions to Barriers:  see above    Discharge Planning/Teaching Needs:  Wife can provide meet pt's needs for 24/7 supervision and minimal assistance, as needed.  Wife is doing well with therapists and pt and needs no further training.   Team Discussion:  Pt's hydration status is better, though he still needs encouragement to drink liquids.  Team feels pt is ready for d/c and that wife has been excellent with family education.  Revisions to Treatment Plan:  None   Continued Need for Acute Rehabilitation Level of Care: The patient requires daily medical management by a physician with specialized training in physical medicine and rehabilitation for the following conditions: Daily direction of a multidisciplinary physical rehabilitation program to ensure safe treatment while eliciting the highest outcome that is of practical value to the patient.: Yes Daily medical management of patient stability for increased activity during participation in an intensive rehabilitation regime.: Yes Daily analysis of laboratory values and/or radiology reports with any subsequent need for medication adjustment of medical intervention for : Neurological problems;Other  Ketrina Boateng, [name] 01/31/2014, 11:27 AM

## 2014-02-01 ENCOUNTER — Ambulatory Visit: Payer: Medicare Other

## 2014-02-01 ENCOUNTER — Ambulatory Visit: Payer: Medicare Other | Admitting: Physical Therapy

## 2014-02-04 ENCOUNTER — Encounter: Payer: Medicare Other | Admitting: Occupational Therapy

## 2014-02-04 NOTE — Discharge Summary (Signed)
Physician Discharge Summary  Patient ID: [name] MRN: [id number] DOB/AGE: [birth year] 78 y.o.  Admit date: 01/09/2014 Discharge date: 2/05 /2015  Discharge Diagnoses:  Principal Problem:   CVA (cerebral infarction) Active Problems:   HYPERTENSION   Prerenal azotemia   UTI (lower urinary tract infection)   Nocturia   Atrial fibrillation   Discharged Condition: Improved  Significant Diagnostic Studies: N/A   Labs:  Basic Metabolic Panel:  Recent Labs Lab 01/30/14 0525  NA 137  K 4.6  CL 98  CO2 29  GLUCOSE 89  BUN 24*  CREATININE 1.02  CALCIUM 9.0    CBC: No results found for this basename: WBC, NEUTROABS, HGB, HCT, MCV, PLT,  in the last 168 hours  CBG: No results found for this basename: GLUCAP,  in the last 168 hours  Brief HPI:   [name] is a 80 y.o. right handed male with history of COPD, prior CVA 08/2012 with right sided weakness and dysphagia ( CIR stay) , A fib--Xarelto and bladder cancer with ongoing treatment at [facility]. He was originally  admitted 12/25/2013 with altered mental status and fever due to SIRS as well as flu.  Cranial CT scan negative and MRI of the brain showed punctate acute infarct in the high left parietal lobe as well as extensive chronic ischemic changes. Patient's Xarelto held for a short time secondary to hematuria but resumed per neurology recommendations  for embolic stroke due to A Fib. He was transferred to CIR on 01/01/14 and placed on IVF due to renal insufficiency and dysphagia diet. He was doing well with rehab but on 01/05/14, he developed acute onset of right sided weakness, right inattention as well as speech difficulty. Code stroke was initiated and he was transferred to acute services.  He also had  uncontrolled movements of the right upper extremity due to "Alien hand syndrome" however the symptoms have subsequently improved. Once stabilized, he was readmitted to CIR to complete his rehab course.    Hospital Course:  [name] was admitted to rehab 78/14/2015 for inpatient therapies to consist of PT, ST and OT at least three hours five days a week. Past admission physiatrist, therapy team and rehab RN have worked together to provide customized collaborative inpatient rehab. He was maintained on Xarelto without recurrence in hematuria. Klebsiella UTI was treated with one week course of keflex.  Po intake has improved and diet was advanced to dysphagia 3 with nectar liquids.  He has required IVF despite nectar liquids but once water protocol initiated his hydration status improved. Therefore IV fluids were discontinued.  His energy levels have shown improvement and he has had good recovery in strength as well as use of RUE. He was noted to have tachycardia with increase in activity level and low dose digoxin was resumed. Dig level of 01/30/14 was  <0.3.  ABLA is slowly improving with Hgb rising from 9.5 to 10.3 g/dL.  He has made steady progress and is at min assist level overall.  Wife is very supportive and has been present for most sessions. Family education was done with wife and son who will provide assistance as needed past discharge. He will continue to receive home health PT, OT and ST past discharge.    Rehab course: During patient's stay in rehab weekly team conferences were held to monitor patient's progress, set goals and discuss barriers to discharge. Patient has had improvement in activity tolerance, balance, postural control, as well as ability to compensate for deficits. He  has had improvement in functional use RUE  and RLE as well as improved awareness. He requires min assist for ADL tasks. He is able to perform transfer and ambulate up to 28' with supervision and use of RW. He is oriented X 4 but requires min cues for basic problem solving and moderate cues for working memory. He continues to require min cues to increase vocal intensity to improve speech intelligibility. He is tolerating dysphagia 3 diet  with water in between meals.     Disposition: 06-Home-Health Care Svc  Diet:  Soft. Nectar liquids with meals. Water protocol  Special Instructions: 1. [facility] to provide PT, OT, ST.       Future Appointments Provider Department Dept Phone   03/07/2014 11:00 AM Flora Lipps [facility] Physical Medicine and Rehabilitation [phone]   03/08/2014 10:15 AM Darlin Coco, MD [place] Office [phone]        Medication List    STOP taking these medications       dextrose 5 % and 0.9 % NaCl with KCl 20 mEq/L 20-5-0.9 MEQ/L-%-%     diltiazem 300 MG 24 hr capsule  Commonly known as:  CARDIZEM CD     doxycycline 100 MG tablet  Commonly known as:  VIBRA-TABS     nitrofurantoin (macrocrystal-monohydrate) 100 MG capsule  Commonly known as:  MACROBID      TAKE these medications       digoxin 0.125 MG tablet  Commonly known as:  LANOXIN  Take 0.5 tablets (0.0625 mg total) by mouth daily.     diltiazem 60 MG tablet  Commonly known as:  CARDIZEM  Take 1 tablet (60 mg total) by mouth every 8 (eight) hours.     PROBIOTIC DAILY PO  Take 1 tablet by mouth every morning.     RESOURCE THICKENUP CLEAR Powd  Take 1 g by mouth as needed.     Rivaroxaban 15 MG Tabs tablet  Commonly known as:  XARELTO  Take 1 tablet (15 mg total) by mouth daily.     senna-docusate 8.6-50 MG per tablet  Commonly known as:  Senokot-S  Take 2 tablets by mouth 2 (two) times daily. For constipation.     Vitamin D 2000 UNITS tablet  Take 2,000-3,000 Units by mouth daily. Patient takes 2000 units daily, except for taking 3000 units (1.5 tablets) on "winter days" and days when "not feeling well."       Follow-up Information   Follow up with Charlett Blake, MD On 03/07/2014. (Be there at 10;30  for 11 am appointment)    Specialty:  Physical Medicine and Rehabilitation   Contact information:   [place] [address] [phone]        Follow up with Forbes Cellar, MD. Call today. (for appointment in 6 weeks)    Specialties:  Neurology, Radiology   Contact information:   [place] [address] [phone]       Follow up with Darlin Coco, MD On 03/08/2014. (appointment at 10:15 am)    Specialty:  Cardiology   Contact information:   [address] [place] [address] [phone]       Follow up with Cathlean Cower, MD On 02/07/2014. (@ 10:45 AM)    Specialties:  Internal Medicine, Radiology   Contact information:   [address] [address] [place] [address] [phone]       Signed:  Bary Leriche 02/04/2014, 4:47 PM

## 2014-02-06 ENCOUNTER — Telehealth: Payer: Self-pay

## 2014-02-06 NOTE — Telephone Encounter (Signed)
Ester @ [facility] is requesting a verbal order for a home health nurse for one visit. Patient has calluses on feet that he has been trying to cut off himself. Ester feels a nurse needs to take a look at them to see if he needs further treatment. Is this okay?

## 2014-02-07 ENCOUNTER — Ambulatory Visit: Payer: Medicare Other | Admitting: Internal Medicine

## 2014-02-07 NOTE — Telephone Encounter (Signed)
OK for RN eval

## 2014-02-08 NOTE — Telephone Encounter (Signed)
Contacted Ester @ [facility] to give her verbal orders per Dr. Letta Pate for a RN to go to patient's home to evaluate patient's feet.

## 2014-02-14 DIAGNOSIS — Z5189 Encounter for other specified aftercare: Secondary | ICD-10-CM

## 2014-02-14 DIAGNOSIS — R1310 Dysphagia, unspecified: Secondary | ICD-10-CM

## 2014-02-14 DIAGNOSIS — I69991 Dysphagia following unspecified cerebrovascular disease: Secondary | ICD-10-CM

## 2014-02-14 DIAGNOSIS — I69919 Unspecified symptoms and signs involving cognitive functions following unspecified cerebrovascular disease: Secondary | ICD-10-CM

## 2014-02-15 ENCOUNTER — Telehealth: Payer: Self-pay

## 2014-02-15 NOTE — Telephone Encounter (Signed)
Informed [name] is it ok to UA and C&S.  She will do this Monday when she sees the patient again.

## 2014-02-15 NOTE — Telephone Encounter (Signed)
[name] with [facility] called to get an order to collect a urine sample.  Patients wife says he has history of UTI/bladder cancer and his urine has been dark.  Please adivse.

## 2014-02-15 NOTE — Telephone Encounter (Signed)
Make it UA C&S, results to primary care physician

## 2014-02-20 ENCOUNTER — Telehealth: Payer: Self-pay

## 2014-02-20 NOTE — Telephone Encounter (Signed)
FYI:: Ester @ [facility] called to inform you that patient is now in outpatient occupational therapy. Patient is finished with home therapy per patient.

## 2014-02-20 NOTE — Telephone Encounter (Signed)
thanks

## 2014-02-27 ENCOUNTER — Ambulatory Visit: Payer: Medicare Other | Attending: Physical Medicine & Rehabilitation

## 2014-02-27 ENCOUNTER — Ambulatory Visit: Payer: Medicare Other | Admitting: *Deleted

## 2014-02-27 ENCOUNTER — Ambulatory Visit: Payer: Medicare Other | Admitting: Physical Therapy

## 2014-02-27 DIAGNOSIS — R269 Unspecified abnormalities of gait and mobility: Secondary | ICD-10-CM | POA: Insufficient documentation

## 2014-02-27 DIAGNOSIS — I69919 Unspecified symptoms and signs involving cognitive functions following unspecified cerebrovascular disease: Secondary | ICD-10-CM | POA: Insufficient documentation

## 2014-02-27 DIAGNOSIS — I69922 Dysarthria following unspecified cerebrovascular disease: Secondary | ICD-10-CM | POA: Insufficient documentation

## 2014-02-27 DIAGNOSIS — R5381 Other malaise: Secondary | ICD-10-CM | POA: Insufficient documentation

## 2014-02-27 DIAGNOSIS — R4701 Aphasia: Secondary | ICD-10-CM | POA: Insufficient documentation

## 2014-02-27 DIAGNOSIS — M6281 Muscle weakness (generalized): Secondary | ICD-10-CM | POA: Insufficient documentation

## 2014-02-27 DIAGNOSIS — Z5189 Encounter for other specified aftercare: Secondary | ICD-10-CM | POA: Insufficient documentation

## 2014-02-27 DIAGNOSIS — R279 Unspecified lack of coordination: Secondary | ICD-10-CM | POA: Insufficient documentation

## 2014-03-01 ENCOUNTER — Ambulatory Visit (INDEPENDENT_AMBULATORY_CARE_PROVIDER_SITE_OTHER): Payer: Medicare Other | Admitting: Cardiology

## 2014-03-01 ENCOUNTER — Encounter: Payer: Self-pay | Admitting: Cardiology

## 2014-03-01 VITALS — BP 125/84 | HR 112 | Ht 69.0 in | Wt 122.0 lb

## 2014-03-01 DIAGNOSIS — I639 Cerebral infarction, unspecified: Secondary | ICD-10-CM

## 2014-03-01 DIAGNOSIS — R319 Hematuria, unspecified: Secondary | ICD-10-CM | POA: Insufficient documentation

## 2014-03-01 DIAGNOSIS — I4891 Unspecified atrial fibrillation: Secondary | ICD-10-CM

## 2014-03-01 DIAGNOSIS — I6350 Cerebral infarction due to unspecified occlusion or stenosis of unspecified cerebral artery: Secondary | ICD-10-CM

## 2014-03-01 DIAGNOSIS — Z7901 Long term (current) use of anticoagulants: Secondary | ICD-10-CM

## 2014-03-01 DIAGNOSIS — I10 Essential (primary) hypertension: Secondary | ICD-10-CM

## 2014-03-01 LAB — CBC WITH DIFFERENTIAL/PLATELET
Basophils Absolute: 0 10*3/uL (ref 0.0–0.1)
Basophils Relative: 0.3 % (ref 0.0–3.0)
Eosinophils Absolute: 0 10*3/uL (ref 0.0–0.7)
Eosinophils Relative: 0.6 % (ref 0.0–5.0)
HCT: 38.1 % — ABNORMAL LOW (ref 39.0–52.0)
Hemoglobin: 12.1 g/dL — ABNORMAL LOW (ref 13.0–17.0)
Lymphocytes Relative: 17.4 % (ref 12.0–46.0)
Lymphs Abs: 1.1 10*3/uL (ref 0.7–4.0)
MCHC: 31.8 g/dL (ref 30.0–36.0)
MCV: 101 fl — AB (ref 78.0–100.0)
MONO ABS: 0.8 10*3/uL (ref 0.1–1.0)
Monocytes Relative: 12.9 % — ABNORMAL HIGH (ref 3.0–12.0)
NEUTROS PCT: 68.8 % (ref 43.0–77.0)
Neutro Abs: 4.3 10*3/uL (ref 1.4–7.7)
PLATELETS: 206 10*3/uL (ref 150.0–400.0)
RBC: 3.77 Mil/uL — ABNORMAL LOW (ref 4.22–5.81)
RDW: 15.2 % — ABNORMAL HIGH (ref 11.5–14.6)
WBC: 6.2 10*3/uL (ref 4.5–10.5)

## 2014-03-01 LAB — BASIC METABOLIC PANEL
BUN: 23 mg/dL (ref 6–23)
CO2: 31 meq/L (ref 19–32)
CREATININE: 1 mg/dL (ref 0.4–1.5)
Calcium: 9.2 mg/dL (ref 8.4–10.5)
Chloride: 98 mEq/L (ref 96–112)
GFR: 75.58 mL/min (ref >60.00)
Glucose, Bld: 82 mg/dL (ref 70–99)
Potassium: 4.3 mEq/L (ref 3.5–5.1)
Sodium: 135 mEq/L (ref 135–145)

## 2014-03-01 MED ORDER — DILTIAZEM HCL ER COATED BEADS 120 MG PO CP24: 120.0000 mg | ORAL_CAPSULE | Freq: Every day | ORAL | Status: DC

## 2014-03-01 NOTE — Assessment & Plan Note (Signed)
His most recent CVA in January 2015 resulted in right-sided weakness and also difficulty swallowing.  He is still on swallowing restrictions.  His therapist we'll plan to do another swallowing study subsequently. The patient has been making good progress in physical therapy.  He still walks with a walker.

## 2014-03-01 NOTE — Assessment & Plan Note (Signed)
The patient still has significant gross hematuria according to the wife.  We will get a CBC today.  She is concerned that he may be getting anemic because of blood loss.  The patient has an appointment to see his urologist Dr. Rosana Hoes in several weeks.

## 2014-03-01 NOTE — Progress Notes (Signed)
[name] Date of Birth:  [birth year] [name], [place]  [address] [phone]         Fax   [phone]  History of Present Illness: [name] is seen back today for a followup office visit. Previously followed by Dr. Azzie Roup and Roque Cash in [facility]. Has had chronic atrial fib, difficult to control his rate.  Since we last saw him he has had another CVA.  He was hospitalized on the rehabilitation service from 01/09/14 until 01/31/14.[name] His other problems include CRI, HLD, anemia, HTN, COPD, remote ITP with prior spleenectomy and pseudogout. He has had a remote echo back in 2010 showing an EF of 45 to 50%. He also has bladder cancer and is followed by Dr. Tresa Endo in [place].  Since last visit he has had successful bladder surgery and since then he has continued to have intermittent heavy hematuria.  He previously was in a research study regarding his bladder tumor but is no longer participating in a clinical trial.   Current Outpatient Prescriptions  Medication Sig Dispense Refill  . Cholecalciferol (VITAMIN D) 2000 UNITS tablet Take 2,000-3,000 Units by mouth daily. Patient takes 2000 units daily, except for taking 3000 units (1.5 tablets) on "winter days" and days when "not feeling well."      . digoxin (LANOXIN) 0.125 MG tablet Take 0.5 tablets (0.0625 mg total) by mouth daily.  30 tablet  1  . Maltodextrin-Xanthan Gum (RESOURCE THICKENUP CLEAR) POWD Take 1 g by mouth as needed.  125 g  4  . Probiotic Product (PROBIOTIC DAILY PO) Take 1 tablet by mouth every morning.       . Rivaroxaban (XARELTO) 15 MG TABS tablet Take 1 tablet (15 mg total) by mouth daily.  30 tablet  12  . senna-docusate (SENOKOT-S) 8.6-50 MG per tablet Take 2 tablets by mouth 2 (two) times daily. For constipation.      [name] diltiazem (CARDIZEM CD) 120 MG 24 hr capsule Take 1 capsule (120 mg total) by mouth daily.  30 capsule  5   No current facility-administered medications for this  visit.    Allergies  Allergen Reactions  . Diazepam     REACTION: agitation  . Ezetimibe-Simvastatin     unknown  . Morphine Other (See Comments)    Hallucinations.  . Sulfonamide Derivatives Hives    Patient Active Problem List   Diagnosis Date Noted  . Hematuria 03/01/2014  . Acute CVA (cerebrovascular accident) 01/07/2014  . Altered mental status 01/05/2014  . Influenza with other respiratory manifestations 01/01/2014  . Prediabetes 01/01/2014  . CVA (cerebral infarction) 01/01/2014  . Atrial fibrillation 12/25/2013  . Hypothermia 03/23/2013  . Constipation 10/16/2012  . Preventative health care 09/17/2012  . Nocturia 09/17/2012  . UTI (lower urinary tract infection) 08/22/2012  . Leukocytosis 08/22/2012  . Pseudogout 08/22/2012  . Encephalopathy 08/21/2012  . Left knee pain 08/21/2012  . Prerenal azotemia 08/21/2012  . CVA (cerebral vascular accident)-Acute right small nonhemorrhagic infarct seen on MRI 08/17/2012  . Lacunar stroke 04/07/2011  . Pleural effusion 04/07/2011  . 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 Long term current use of anticoagulant 04/07/2011  . NEOPLASM, MALIGNANT, BLADDER 04/10/2009  . HYPERLIPIDEMIA 04/10/2009  . ANEMIA-NOS 04/10/2009  . 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 ANXIETY 04/10/2009  . HYPERTENSION 04/10/2009  . COPD 04/10/2009  . TRANSIENT ISCHEMIC ATTACK, HX OF 04/10/2009    History  Smoking status  . Never Smoker   Smokeless tobacco  .  Never Used    History  Alcohol Use No    Family History  Problem Relation Age of Onset  . Heart disease Mother   . Arthritis Father     Review of Systems: Constitutional: no fever chills diaphoresis or fatigue or change in weight.  Head and neck: no hearing loss, no epistaxis, no photophobia or visual disturbance. Respiratory: No cough, shortness of breath or wheezing. Cardiovascular: No chest pain peripheral edema, palpitations. Gastrointestinal: No abdominal distention, no  abdominal pain, no change in bowel habits hematochezia or melena. Genitourinary: No dysuria, no frequency, no urgency, no nocturia. Musculoskeletal:No arthralgias, no back pain, no gait disturbance or myalgias. Neurological: No dizziness, no headaches, no numbness, no seizures, no syncope, no weakness, no tremors. Hematologic: No lymphadenopathy, no easy bruising. Psychiatric: No confusion, no hallucinations, no sleep disturbance.    Physical Exam: Filed Vitals:   03/01/14 1149  BP: 125/84  Pulse: 112   the general appearance reveals a well-developed well-nourished elderly gentleman in no distress.The head and neck exam reveals pupils equal and reactive.  Extraocular movements are full.  There is no scleral icterus.  The mouth and pharynx are normal.  The neck is supple.  The carotids reveal no bruits.  The jugular venous pressure is normal.  The  thyroid is not enlarged.  There is no lymphadenopathy.  The chest is clear to percussion and auscultation.  There are no rales or rhonchi.  Expansion of the chest is symmetrical.  The precordium is quiet.  The pulse is irregularly irregular.  The first heart sound is normal.  The second heart sound is physiologically split.  There is no murmur gallop rub or click.  There is no abnormal lift or heave.  The abdomen is soft and nontender.  The bowel sounds are normal.  The liver and spleen are not enlarged.  There are no abdominal masses.  There are no abdominal bruits.  Extremities reveal good pedal pulses.  There is no phlebitis or edema.  There is no cyanosis or clubbing.  Strength is normal and symmetrical in all extremities.  There is no lateralizing weakness.  There are no sensory deficits.  The skin is warm and dry.  There is no rash.     Assessment / Plan: Recheck in 3 months for office visit and EKG.  We will also get CBC and basal metabolic panel that day. We are checking lab work today. We are switching him back to diltiazem CD 120 one daily  for rate control of his permanent atrial fib.

## 2014-03-01 NOTE — Patient Instructions (Signed)
Will obtain labs today and call you with the results (cbc/bmet)  STOP DILTIAZEM 60   START DILTIAZEM CD 120 MG DAILY  Your physician recommends that you schedule a follow-up appointment in: 3 month ov/egk/cbc/bmet

## 2014-03-01 NOTE — Assessment & Plan Note (Signed)
The patient was sent home from the hospital on short acting diltiazem 60 mg 3 times a day.  The family is requesting that he go back to his previous once a day long-acting form and we will switch him to diltiazem CD 120 once a day.  His blood pressure has been soft at times.

## 2014-03-01 NOTE — Progress Notes (Signed)
Quick Note:  Please report to patient. The recent labs are stable. Continue same medication and careful diet. Anemia is better. Hgb 12.1 ______

## 2014-03-04 ENCOUNTER — Telehealth: Payer: Self-pay | Admitting: *Deleted

## 2014-03-04 NOTE — Telephone Encounter (Signed)
Message copied by Earvin Hansen on Mon Mar 04, 2014 10:53 AM ------      Message from: Darlin Coco      Created: Fri Mar 01, 2014  9:33 PM       Please report to patient.  The recent labs are stable. Continue same medication and careful diet. Anemia is better. Hgb 12.1 ------

## 2014-03-04 NOTE — Telephone Encounter (Signed)
Advised wife of labs 

## 2014-03-05 ENCOUNTER — Ambulatory Visit: Payer: Medicare Other | Admitting: Physical Therapy

## 2014-03-05 ENCOUNTER — Ambulatory Visit: Payer: Medicare Other | Admitting: Occupational Therapy

## 2014-03-05 ENCOUNTER — Ambulatory Visit: Payer: Medicare Other | Admitting: Speech Pathology

## 2014-03-07 ENCOUNTER — Ambulatory Visit: Payer: Medicare Other | Admitting: Physical Medicine and Rehabilitation

## 2014-03-08 ENCOUNTER — Ambulatory Visit: Payer: Medicare Other

## 2014-03-08 ENCOUNTER — Ambulatory Visit: Payer: Medicare Other | Admitting: Cardiology

## 2014-03-08 ENCOUNTER — Ambulatory Visit: Payer: Medicare Other | Admitting: Occupational Therapy

## 2014-03-08 ENCOUNTER — Ambulatory Visit: Payer: Medicare Other | Admitting: Physical Therapy

## 2014-03-12 ENCOUNTER — Ambulatory Visit: Payer: Medicare Other | Admitting: Rehabilitative and Restorative Service Providers"

## 2014-03-12 ENCOUNTER — Ambulatory Visit: Payer: Medicare Other | Admitting: Speech Pathology

## 2014-03-12 ENCOUNTER — Ambulatory Visit: Payer: Medicare Other | Admitting: Occupational Therapy

## 2014-03-14 ENCOUNTER — Encounter: Payer: Self-pay | Admitting: Physical Medicine and Rehabilitation

## 2014-03-14 ENCOUNTER — Encounter
Payer: Medicare Other | Attending: Physical Medicine and Rehabilitation | Admitting: Physical Medicine and Rehabilitation

## 2014-03-14 VITALS — BP 131/83 | HR 123 | Resp 14 | Ht 69.0 in | Wt 121.0 lb

## 2014-03-14 DIAGNOSIS — IMO0002 Reserved for concepts with insufficient information to code with codable children: Secondary | ICD-10-CM | POA: Insufficient documentation

## 2014-03-14 DIAGNOSIS — M179 Osteoarthritis of knee, unspecified: Secondary | ICD-10-CM

## 2014-03-14 DIAGNOSIS — I6350 Cerebral infarction due to unspecified occlusion or stenosis of unspecified cerebral artery: Secondary | ICD-10-CM

## 2014-03-14 DIAGNOSIS — J449 Chronic obstructive pulmonary disease, unspecified: Secondary | ICD-10-CM | POA: Insufficient documentation

## 2014-03-14 DIAGNOSIS — I10 Essential (primary) hypertension: Secondary | ICD-10-CM | POA: Insufficient documentation

## 2014-03-14 DIAGNOSIS — I4891 Unspecified atrial fibrillation: Secondary | ICD-10-CM | POA: Insufficient documentation

## 2014-03-14 DIAGNOSIS — J4489 Other specified chronic obstructive pulmonary disease: Secondary | ICD-10-CM | POA: Insufficient documentation

## 2014-03-14 DIAGNOSIS — M1710 Unilateral primary osteoarthritis, unspecified knee: Secondary | ICD-10-CM

## 2014-03-14 DIAGNOSIS — I639 Cerebral infarction, unspecified: Secondary | ICD-10-CM

## 2014-03-14 DIAGNOSIS — C679 Malignant neoplasm of bladder, unspecified: Secondary | ICD-10-CM | POA: Insufficient documentation

## 2014-03-14 MED ORDER — DICLOFENAC SODIUM 1 % TD GEL: 2.0000 g | Freq: Three times a day (TID) | TRANSDERMAL | Status: DC

## 2014-03-14 NOTE — Progress Notes (Signed)
Subjective:    Patient ID: [name], male    DOB: [birth year], 80 y.o.   MRN: [id number]  HPI [name] is a 80 y.o. right handed male with history of COPD,CVA,  ) , A fib and bladder cancer with ongoing treatment at [facility]. He was originally admitted 12/25/2013 with altered mental status and fever due to SIRS as well as flu. Cranial CT scan negative and MRI of the brain showed punctate acute infarct in the high left parietal lobe as well as extensive chronic ischemic changes. Patient's Xarelto held for a short time secondary to hematuria but resumed per neurology recommendations for embolic stroke due to A Fib. He was transferred to CIR on 01/01/14 and  was doing until 01/05/14 when he developed acute onset of right sided weakness, right inattention as well as speech difficulty. He was transferred to acute services for work up and MRI revealed newly seen acute infarction within the left thalamus, left occipital lobe and left temporal lobe. He was maintained on xarelto and Kleb UTI treated with keflex.   He was readmitted to CIR on 01/09/14 and has good recovery in strength of RUE as well as improvement in dysphagia. At discharge he was requires min assist for ADL tasks. He was able to perform transfer and ambulate up to 17' with supervision and use of RW. He was oriented X 4 but required min cues for basic problem solving and moderate cues for working memory as well as  min cues to increase vocal intensity to improve speech intelligibility. He was on dysphagia 3 diet with nectar liquids with meals and water in between meals. He was discharged to home on 02/04/14.   He has completed [place] and has transitioned to outpatient therapy on 02/20/14. He is receiving PT,OT and ST 2-3 times a week. He has had one fall in the bathroom due to difficulty in getting walker in sideways. He has difficulty getting out of low surfaces as well as chairs without armrests. Wife uses the transport chair in dinning room. He  continue to feed himself with his left hand as right hand weakness continues. She helps him complete bathing and dressing tasks. He continues on D3 diet with nectar and water protocol between meals.  Wife reports he still drools when relaxed and needs occasional cues to mobilize/swallow secretions.  Short term memory continues to be significantly impaired. Wife provides 24 hours supervision and does have hired help for 5 hrs/week to run errands.    Pain Inventory Average Pain 4 Pain Right Now 4 My pain is intermittent and aching  In the last 24 hours, has pain interfered with the following? General activity 10 Relation with others 10 Enjoyment of life 10 What TIME of day is your pain at its worst? varies Sleep (in general) Fair  Pain is worse with: walking Pain improves with: rest Relief from Meds: 0  Mobility walk with assistance use a cane do you drive?  no needs help with transfers  Function retired I need assistance with the following:  feeding, dressing, bathing, toileting, meal prep, household duties and shopping Do you have any goals in this area?  yes  Neuro/Psych bladder control problems numbness trouble walking confusion  Prior Studies Any changes since last visit?  no  Physicians involved in your care Any changes since last visit?  no   Family History  Problem Relation Age of Onset  . Heart disease Mother   . Arthritis Father    History  Social History  . Marital Status: Married    Spouse Name: N/A    Number of Children: N/A  . Years of Education: N/A   Occupational History  . 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 Smoking status: Never Smoker   . Smokeless tobacco: Never Used  . Alcohol Use: No  . Drug Use: No  . Sexual Activity: None   Other Topics Concern  . None   Social History Narrative   Wife is Yordy Macaskill - pt. Of [place]   Past Surgical History  Procedure Laterality  Date  . Splenectomy    . Bilateral vats ablation    . Facial cancer      facial skin cancer   Past Medical History  Diagnosis Date  . NEOPLASM, MALIGNANT, BLADDER 04/10/2009  . HYPERLIPIDEMIA 04/10/2009  . ANEMIA-NOS 04/10/2009  . 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 ANXIETY 04/10/2009  . HYPERTENSION 04/10/2009  . Atrial fibrillation 04/10/2009  . COPD 04/10/2009  . TRANSIENT ISCHEMIC ATTACK, HX OF 04/10/2009  . Bladder cancer     Dr. Lawerance Bach  . Lacunar stroke 04/07/2011  . Pleural effusion 04/07/2011  . 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 Stroke    BP 131/83  Pulse 123  Resp 14  Ht 5\' 9"  (1.753 m)  Wt 121 lb (54.885 kg)  BMI 17.86 kg/m2  SpO2 99%  Opioid Risk Score:   Fall Risk Score: High Fall Risk (>13 points) (pt educated and given brochure previously)    Review of Systems  Constitutional: Positive for unexpected weight change.  HENT: Positive for trouble swallowing and voice change.   Respiratory: Negative for shortness of breath.   Cardiovascular: Negative for chest pain and leg swelling.  Gastrointestinal: Negative for abdominal pain and constipation.  Genitourinary: Positive for urgency, frequency and hematuria.       Bladder control problems  Musculoskeletal: Positive for arthralgias (bilateral knee pain) and gait problem.  Neurological: Positive for speech difficulty and numbness (right hand and right foot). Negative for dizziness.  Psychiatric/Behavioral: Positive for confusion.  All other systems reviewed and are negative.       Objective:   Physical Exam  Nursing note and vitals reviewed. Constitutional: He is oriented to person, place, and time.  Thin elderly male  HENT:  Head: Normocephalic and atraumatic.  Eyes: Conjunctivae are normal. Pupils are equal, round, and reactive to light.  Neck: Normal range of motion. Neck supple.  Cardiovascular: Normal rate.  An irregular rhythm present.  Pulmonary/Chest: Effort normal and breath sounds normal.  No respiratory distress. He has no wheezes.  Abdominal: Soft. Bowel sounds are normal. He exhibits no distension. There is no tenderness.  Neurological: He is alert and oriented to person, place, and time.  Still dysphonic but his voice shows improvement in volume. He does need to clear throat intermittently but no drooling noted. He continues to have poor awareness with impaired insight and poor memory.  Perseverative speech needing redirection. Strength LUE/LLE 5/5. Strength RUE 5/5 except intrinsics  4+/5. Numbness right hand and right foot.   Skin: Skin is warm and dry.          Assessment & Plan:  1.  L- PCA embolic infarct [id number] superimposed on L high parietal infarct onset 12/25/2013:  Has had improvement in RUE strength but continues with decrease in motor control right hand due to sensory deficits. Continue progressive outpatient PT, OT, ST. Wife is very supportive  seems exhausted and reports that she has lost 7 lbs.  She continue to provide 24 hours supervision and assistance due to balance and cognitive issues.   2. Bladder Cancer:  Recurrence due to  recurrent hematuria.  He is not a candidate for further treatment per Dr. Rosana Hoes. His primary care physicians are  to keep up with H/H. Wife aware of quandary due to need to stay on blood thinners for stroke prevention. He has frequency with urgency and needs to be toileted every hour during the day.   3. Dysphagia:  He continues on water protocol and is getting cognitive therapy on outpatient basis. He hasn't had an objective swallow evaluation and she expressed concerns about this. Advised her to bring this up with outpatient ST and get input on diet advancement. Wife is pushing water between meals due to his bladder hx.   4. Mood: Stable overall. He hopes to get back to golf in the next few months.

## 2014-03-15 ENCOUNTER — Ambulatory Visit: Payer: Medicare Other | Admitting: Occupational Therapy

## 2014-03-15 ENCOUNTER — Ambulatory Visit: Payer: Medicare Other

## 2014-03-15 ENCOUNTER — Encounter: Payer: Self-pay | Admitting: Cardiology

## 2014-03-18 ENCOUNTER — Ambulatory Visit: Payer: Medicare Other | Admitting: *Deleted

## 2014-03-18 ENCOUNTER — Ambulatory Visit: Payer: Medicare Other | Admitting: Physical Therapy

## 2014-03-18 ENCOUNTER — Ambulatory Visit: Payer: Medicare Other

## 2014-03-20 ENCOUNTER — Ambulatory Visit: Payer: Medicare Other

## 2014-03-20 ENCOUNTER — Ambulatory Visit: Payer: Medicare Other | Admitting: Occupational Therapy

## 2014-03-20 ENCOUNTER — Ambulatory Visit: Payer: Medicare Other | Admitting: Physical Therapy

## 2014-03-26 ENCOUNTER — Ambulatory Visit: Payer: Medicare Other | Admitting: Physical Therapy

## 2014-03-26 ENCOUNTER — Ambulatory Visit: Payer: Medicare Other | Admitting: Speech Pathology

## 2014-03-26 ENCOUNTER — Ambulatory Visit: Payer: Medicare Other | Admitting: Occupational Therapy

## 2014-03-28 ENCOUNTER — Other Ambulatory Visit (INDEPENDENT_AMBULATORY_CARE_PROVIDER_SITE_OTHER): Payer: Medicare Other

## 2014-03-28 ENCOUNTER — Ambulatory Visit: Payer: Medicare Other | Admitting: Occupational Therapy

## 2014-03-28 ENCOUNTER — Ambulatory Visit: Payer: Medicare Other | Attending: Physical Medicine & Rehabilitation | Admitting: Physical Therapy

## 2014-03-28 ENCOUNTER — Ambulatory Visit (INDEPENDENT_AMBULATORY_CARE_PROVIDER_SITE_OTHER): Payer: Medicare Other | Admitting: Internal Medicine

## 2014-03-28 ENCOUNTER — Encounter: Payer: Self-pay | Admitting: Internal Medicine

## 2014-03-28 ENCOUNTER — Ambulatory Visit: Payer: Medicare Other

## 2014-03-28 VITALS — BP 120/78 | HR 76 | Temp 97.3°F | Resp 16 | Wt 120.0 lb

## 2014-03-28 DIAGNOSIS — R279 Unspecified lack of coordination: Secondary | ICD-10-CM | POA: Insufficient documentation

## 2014-03-28 DIAGNOSIS — I69919 Unspecified symptoms and signs involving cognitive functions following unspecified cerebrovascular disease: Secondary | ICD-10-CM | POA: Insufficient documentation

## 2014-03-28 DIAGNOSIS — N390 Urinary tract infection, site not specified: Secondary | ICD-10-CM

## 2014-03-28 DIAGNOSIS — R269 Unspecified abnormalities of gait and mobility: Secondary | ICD-10-CM | POA: Insufficient documentation

## 2014-03-28 DIAGNOSIS — R319 Hematuria, unspecified: Secondary | ICD-10-CM

## 2014-03-28 DIAGNOSIS — R4701 Aphasia: Secondary | ICD-10-CM | POA: Insufficient documentation

## 2014-03-28 DIAGNOSIS — R5381 Other malaise: Secondary | ICD-10-CM | POA: Insufficient documentation

## 2014-03-28 DIAGNOSIS — I69922 Dysarthria following unspecified cerebrovascular disease: Secondary | ICD-10-CM | POA: Insufficient documentation

## 2014-03-28 DIAGNOSIS — M6281 Muscle weakness (generalized): Secondary | ICD-10-CM | POA: Insufficient documentation

## 2014-03-28 DIAGNOSIS — Z5189 Encounter for other specified aftercare: Secondary | ICD-10-CM | POA: Insufficient documentation

## 2014-03-28 LAB — URINALYSIS, ROUTINE W REFLEX MICROSCOPIC
Bilirubin Urine: NEGATIVE
Ketones, ur: NEGATIVE
NITRITE: POSITIVE — AB
Specific Gravity, Urine: 1.02 (ref 1.000–1.030)
Total Protein, Urine: 100 — AB
Urine Glucose: NEGATIVE
Urobilinogen, UA: 0.2 (ref 0.0–1.0)
pH: 6 (ref 5.0–8.0)

## 2014-03-28 MED ORDER — CIPROFLOXACIN HCL 500 MG PO TABS: 500.0000 mg | ORAL_TABLET | Freq: Two times a day (BID) | ORAL | Status: AC

## 2014-03-28 NOTE — Patient Instructions (Signed)
Urinary Tract Infection  Urinary tract infections (UTIs) can develop anywhere along your urinary tract. Your urinary tract is your body's drainage system for removing wastes and extra water. Your urinary tract includes two kidneys, two ureters, a bladder, and a urethra. Your kidneys are a pair of bean-shaped organs. Each kidney is about the size of your fist. They are located below your ribs, one on each side of your spine.  CAUSES  Infections are caused by microbes, which are microscopic organisms, including fungi, viruses, and bacteria. These organisms are so small that they can only be seen through a microscope. Bacteria are the microbes that most commonly cause UTIs.  SYMPTOMS   Symptoms of UTIs may vary by age and gender of the patient and by the location of the infection. Symptoms in young women typically include a frequent and intense urge to urinate and a painful, burning feeling in the bladder or urethra during urination. Older women and men are more likely to be tired, shaky, and weak and have muscle aches and abdominal pain. A fever may mean the infection is in your kidneys. Other symptoms of a kidney infection include pain in your back or sides below the ribs, nausea, and vomiting.  DIAGNOSIS  To diagnose a UTI, your caregiver will ask you about your symptoms. Your caregiver also will ask to provide a urine sample. The urine sample will be tested for bacteria and white blood cells. White blood cells are made by your body to help fight infection.  TREATMENT   Typically, UTIs can be treated with medication. Because most UTIs are caused by a bacterial infection, they usually can be treated with the use of antibiotics. The choice of antibiotic and length of treatment depend on your symptoms and the type of bacteria causing your infection.  HOME CARE INSTRUCTIONS   If you were prescribed antibiotics, take them exactly as your caregiver instructs you. Finish the medication even if you feel better after you  have only taken some of the medication.   Drink enough water and fluids to keep your urine clear or pale yellow.   Avoid caffeine, tea, and carbonated beverages. They tend to irritate your bladder.   Empty your bladder often. Avoid holding urine for long periods of time.   Empty your bladder before and after sexual intercourse.   After a bowel movement, women should cleanse from front to back. Use each tissue only once.  SEEK MEDICAL CARE IF:    You have back pain.   You develop a fever.   Your symptoms do not begin to resolve within 3 days.  SEEK IMMEDIATE MEDICAL CARE IF:    You have severe back pain or lower abdominal pain.   You develop chills.   You have nausea or vomiting.   You have continued burning or discomfort with urination.  MAKE SURE YOU:    Understand these instructions.   Will watch your condition.   Will get help right away if you are not doing well or get worse.  Document Released: 09/22/2005 Document Revised: 06/13/2012 Document Reviewed: 01/21/2012  [employer] Patient Information 2014 [employer].

## 2014-03-28 NOTE — Progress Notes (Signed)
Pre visit review using our clinic review tool, if applicable. No additional management support is needed unless otherwise documented below in the visit note. 

## 2014-03-28 NOTE — Progress Notes (Signed)
Subjective:    Patient ID: [name], male    DOB: 07/25/1927, 80 y.o.   MRN: [id number]  Dysuria  This is a recurrent problem. The current episode started in the past 7 days. The problem occurs every urination. The problem has been gradually worsening. The quality of the pain is described as burning. The pain is at a severity of 1/10. The patient is experiencing no pain. There has been no fever. The fever has been present for less than 1 day. He is not sexually active. There is no history of pyelonephritis. Associated symptoms include chills, frequency, hematuria and urgency. Pertinent negatives include no discharge, flank pain, hesitancy, nausea or vomiting. He has tried nothing for the symptoms. His past medical history is significant for catheterization (condom cath), recurrent UTIs, urinary stasis and a urological procedure. There is no history of kidney stones.      Review of Systems  Constitutional: Positive for chills. Negative for fever, diaphoresis, activity change, appetite change, fatigue and unexpected weight change.  HENT: Negative.   Eyes: Negative.   Respiratory: Negative.  Negative for cough, choking, chest tightness, shortness of breath, wheezing and stridor.   Cardiovascular: Negative.  Negative for chest pain, palpitations and leg swelling.  Gastrointestinal: Negative.  Negative for nausea, vomiting, abdominal pain, diarrhea and constipation.  Endocrine: Negative.   Genitourinary: Positive for dysuria, urgency, frequency and hematuria. Negative for hesitancy, flank pain, decreased urine volume, discharge, difficulty urinating and genital sores.  Musculoskeletal: Negative.   Skin: Negative.   Neurological: Negative.   Hematological: Negative.  Negative for adenopathy. Does not bruise/bleed easily.  Psychiatric/Behavioral: Negative.        Objective:   Physical Exam  Vitals reviewed. Constitutional: He is oriented to person, place, and time. He appears  well-developed and well-nourished.  Non-toxic appearance. He does not have a sickly appearance. He does not appear ill. No distress.  HENT:  Head: Normocephalic and atraumatic.  Mouth/Throat: Oropharynx is clear and moist. No oropharyngeal exudate.  Eyes: Conjunctivae are normal. Right eye exhibits no discharge. Left eye exhibits no discharge.  Neck: Normal range of motion. Neck supple. No JVD present. No tracheal deviation present. No thyromegaly present.  Cardiovascular: Normal rate, regular rhythm, normal heart sounds and intact distal pulses.  Exam reveals no gallop and no friction rub.   No murmur heard. Pulmonary/Chest: Effort normal and breath sounds normal. No stridor. No respiratory distress. He has no wheezes. He has no rales. He exhibits no tenderness.  Abdominal: Soft. Bowel sounds are normal. He exhibits no distension and no mass. There is no hepatosplenomegaly, splenomegaly or hepatomegaly. There is no tenderness. There is no rebound, no guarding and no CVA tenderness.  Musculoskeletal: Normal range of motion. He exhibits no edema and no tenderness.  Lymphadenopathy:    He has no cervical adenopathy.  Neurological: He is oriented to person, place, and time.  Skin: Skin is warm and dry. No rash noted. He is not diaphoretic. No erythema. No pallor.     Lab Results  Component Value Date   WBC 6.2 03/01/2014   HGB 12.1* 03/01/2014   HCT 38.1* 03/01/2014   PLT 206.0 03/01/2014   GLUCOSE 82 03/01/2014   CHOL 107 12/29/2013   TRIG 53 12/29/2013   HDL 49 12/29/2013   LDLCALC 47 12/29/2013   ALT 15 01/10/2014   AST 27 01/10/2014   NA 135 03/01/2014   K 4.3 03/01/2014   CL 98 03/01/2014   CREATININE 1.0 03/01/2014   BUN  23 03/01/2014   CO2 31 03/01/2014   TSH 2.07 03/23/2013   INR 1.3* 03/06/2013   HGBA1C 5.8* 12/29/2013       Assessment & Plan:

## 2014-03-29 ENCOUNTER — Encounter: Payer: Self-pay | Admitting: Internal Medicine

## 2014-03-29 NOTE — Assessment & Plan Note (Signed)
Will treat with cipro I await the results of the urine culture

## 2014-03-29 NOTE — Assessment & Plan Note (Signed)
UA is positive for evidence of infection Will start cipro

## 2014-03-30 LAB — CULTURE, URINE COMPREHENSIVE
Colony Count: NO GROWTH
Organism ID, Bacteria: NO GROWTH

## 2014-04-02 ENCOUNTER — Ambulatory Visit: Payer: Medicare Other

## 2014-04-02 ENCOUNTER — Ambulatory Visit: Payer: Medicare Other | Admitting: Physical Therapy

## 2014-04-04 ENCOUNTER — Ambulatory Visit: Payer: Medicare Other | Admitting: Occupational Therapy

## 2014-04-04 ENCOUNTER — Ambulatory Visit: Payer: Medicare Other | Admitting: Physical Therapy

## 2014-04-09 ENCOUNTER — Ambulatory Visit: Payer: Medicare Other | Admitting: Occupational Therapy

## 2014-04-09 ENCOUNTER — Ambulatory Visit: Payer: Medicare Other | Admitting: Physical Therapy

## 2014-04-11 ENCOUNTER — Ambulatory Visit: Payer: Medicare Other | Admitting: Physical Therapy

## 2014-04-11 ENCOUNTER — Ambulatory Visit: Payer: Medicare Other | Admitting: Occupational Therapy

## 2014-04-15 ENCOUNTER — Ambulatory Visit: Payer: Medicare Other | Admitting: *Deleted

## 2014-04-15 ENCOUNTER — Ambulatory Visit: Payer: Medicare Other | Admitting: Physical Therapy

## 2014-04-18 ENCOUNTER — Ambulatory Visit: Payer: Medicare Other | Admitting: Physical Therapy

## 2014-04-18 ENCOUNTER — Ambulatory Visit: Payer: Medicare Other | Admitting: *Deleted

## 2014-04-22 ENCOUNTER — Ambulatory Visit: Payer: Medicare Other | Admitting: *Deleted

## 2014-04-22 ENCOUNTER — Ambulatory Visit: Payer: Medicare Other | Admitting: Physical Therapy

## 2014-04-25 ENCOUNTER — Ambulatory Visit: Payer: Medicare Other | Admitting: Occupational Therapy

## 2014-04-25 ENCOUNTER — Ambulatory Visit: Payer: Medicare Other | Admitting: Physical Therapy

## 2014-05-01 ENCOUNTER — Telehealth: Payer: Self-pay | Admitting: Cardiology

## 2014-05-01 NOTE — Telephone Encounter (Signed)
New message    Patient wife calling asking for a call back from [facility]

## 2014-05-01 NOTE — Telephone Encounter (Signed)
Patient having dizzy spells and blood pressure up has been low at times. Scheduled ov for tomorrow and advised wife to bring blood pressure machine to visit

## 2014-05-02 ENCOUNTER — Ambulatory Visit (INDEPENDENT_AMBULATORY_CARE_PROVIDER_SITE_OTHER): Payer: Medicare Other | Admitting: Cardiology

## 2014-05-02 ENCOUNTER — Encounter: Payer: Self-pay | Admitting: Cardiology

## 2014-05-02 VITALS — BP 118/60 | HR 117 | Ht 69.0 in | Wt 120.0 lb

## 2014-05-02 DIAGNOSIS — I4891 Unspecified atrial fibrillation: Secondary | ICD-10-CM

## 2014-05-02 DIAGNOSIS — I10 Essential (primary) hypertension: Secondary | ICD-10-CM

## 2014-05-02 DIAGNOSIS — I6350 Cerebral infarction due to unspecified occlusion or stenosis of unspecified cerebral artery: Secondary | ICD-10-CM

## 2014-05-02 DIAGNOSIS — Z79899 Other long term (current) drug therapy: Secondary | ICD-10-CM

## 2014-05-02 DIAGNOSIS — I639 Cerebral infarction, unspecified: Secondary | ICD-10-CM

## 2014-05-02 LAB — CBC WITH DIFFERENTIAL/PLATELET
Basophils Absolute: 0 10*3/uL (ref 0.0–0.1)
Basophils Relative: 0.1 % (ref 0.0–3.0)
EOS PCT: 0.5 % (ref 0.0–5.0)
Eosinophils Absolute: 0 10*3/uL (ref 0.0–0.7)
HEMATOCRIT: 35.8 % — AB (ref 39.0–52.0)
Hemoglobin: 11.6 g/dL — ABNORMAL LOW (ref 13.0–17.0)
LYMPHS ABS: 1.7 10*3/uL (ref 0.7–4.0)
Lymphocytes Relative: 28.2 % (ref 12.0–46.0)
MCHC: 32.4 g/dL (ref 30.0–36.0)
MCV: 98.2 fl (ref 78.0–100.0)
MONO ABS: 0.8 10*3/uL (ref 0.1–1.0)
Monocytes Relative: 12.9 % — ABNORMAL HIGH (ref 3.0–12.0)
Neutro Abs: 3.4 10*3/uL (ref 1.4–7.7)
Neutrophils Relative %: 58.3 % (ref 43.0–77.0)
Platelets: 207 10*3/uL (ref 150.0–400.0)
RBC: 3.65 Mil/uL — ABNORMAL LOW (ref 4.22–5.81)
RDW: 13.7 % (ref 11.5–15.5)
WBC: 5.9 10*3/uL (ref 4.0–10.5)

## 2014-05-02 LAB — BASIC METABOLIC PANEL
BUN: 27 mg/dL — ABNORMAL HIGH (ref 6–23)
CHLORIDE: 97 meq/L (ref 96–112)
CO2: 32 mEq/L (ref 19–32)
Calcium: 9.2 mg/dL (ref 8.4–10.5)
Creatinine, Ser: 1.1 mg/dL (ref 0.4–1.5)
GFR: 66.29 mL/min (ref >60.00)
GLUCOSE: 70 mg/dL (ref 70–99)
POTASSIUM: 4.3 meq/L (ref 3.5–5.1)
Sodium: 135 mEq/L (ref 135–145)

## 2014-05-02 NOTE — Assessment & Plan Note (Signed)
The patient has had several embolic strokes in the past usually related to inadequate anticoagulation for his atrial fibrillation.  He has finished his physical therapy for his most recent stroke.  He has had no new TIA symptoms

## 2014-05-02 NOTE — Assessment & Plan Note (Signed)
Last evening his blood pressure was low in the 80 systolic range.  This was by his home blood pressure cuff.  The patient felt somewhat lightheaded and dizzy when his blood pressure was as low.  He brought in his cuff today and we compared it with hours and his cuff is correct.  The patient is not on any antihypertensive medication except for the diltiazem which we use for rate control for his atrial fibrillation.  We will have him increase his salt and water intake when his blood pressure gets low.  He can try some Gatorade.

## 2014-05-02 NOTE — Patient Instructions (Signed)
Will obtain labs today and call you with the results (bmet/cbc)  Your physician recommends that you continue on your current medications as directed. Please refer to the Current Medication list given to you today.  Your physician recommends that you schedule a follow-up appointment in: 3 month ov 

## 2014-05-02 NOTE — Progress Notes (Signed)
Quick Note:  Please report to patient. The recent labs are stable. Continue same medication and careful diet. ______ 

## 2014-05-02 NOTE — Progress Notes (Signed)
[name] Date of Birth:  November 12, 1927 [facility] [address] [place], [place]  [address] [phone]        Fax   [phone]   History of Present Illness: [name] is seen back today for a followup office visit. Previously followed by [name] and [name] in [facility]. Has had chronic atrial fib, difficult to control his rate. Since we last saw him he has had another CVA. He was hospitalized on the rehabilitation service from 01/09/14 until 01/31/14.Marland Kitchen His other problems include CRI, HLD, anemia, HTN, COPD, remote ITP with prior spleenectomy and pseudogout. He has had a remote echo back in 2010 showing an EF of 45 to 50%. He also has bladder cancer and is followed by [name] in [place]. Since last visit he has had successful bladder surgery and since then he has continued to have intermittent heavy hematuria. He previously was in a research study regarding his bladder tumor but is no longer participating in a clinical trial.  He he has been having some problems with low blood pressure and his wife wanted him checked today.   Current Outpatient Prescriptions  Medication Sig Dispense Refill  . Cholecalciferol (VITAMIN D) 2000 UNITS tablet Take 2,000-3,000 Units by mouth daily. Patient takes 2000 units daily, except for taking 3000 units (1.5 tablets) on "winter days" and days when "not feeling well."      . diclofenac sodium (VOLTAREN) 1 % GEL Apply 2 g topically 3 (three) times daily.  3 Tube  1  . digoxin (LANOXIN) 0.125 MG tablet Take 0.5 tablets (0.0625 mg total) by mouth daily.  30 tablet  1  . diltiazem (CARDIZEM CD) 120 MG 24 hr capsule Take 1 capsule (120 mg total) by mouth daily.  30 capsule  5  . Probiotic Product (PROBIOTIC DAILY PO) Take 1 tablet by mouth every morning.       . Rivaroxaban (XARELTO) 15 MG TABS tablet Take 1 tablet (15 mg total) by mouth daily.  30 tablet  12  . senna-docusate (SENOKOT-S) 8.6-50 MG per tablet Take 2  tablets by mouth 2 (two) times daily. For constipation.       No current facility-administered medications for this visit.    Allergies  Allergen Reactions  . Azithromycin     Other reaction(s): Other (See Comments) Altered mental status  . Diazepam     REACTION: agitation  . Ezetimibe-Simvastatin     unknown  . Morphine Other (See Comments)    Hallucinations.  . Prednisone     Other reaction(s): Dizziness (intolerance)  . Sulfonamide Derivatives Hives    Patient Active Problem List   Diagnosis Date Noted  . Hematuria 03/01/2014  . Acute CVA (cerebrovascular accident) 01/07/2014  . Altered mental status 01/05/2014  . Influenza with other respiratory manifestations 01/01/2014  . Prediabetes 01/01/2014  . CVA (cerebral infarction) 01/01/2014  . Atrial fibrillation 12/25/2013  . Hypothermia 03/23/2013  . Constipation 10/16/2012  . Preventative health care 09/17/2012  . Nocturia 09/17/2012  . UTI (lower urinary tract infection) 08/22/2012  . Leukocytosis 08/22/2012  . Pseudogout 08/22/2012  . Encephalopathy 08/21/2012  . Left knee pain 08/21/2012  . Prerenal azotemia 08/21/2012  . CVA (cerebral vascular accident)-Acute right small nonhemorrhagic infarct seen on MRI 08/17/2012  . Lacunar stroke 04/07/2011  . Pleural effusion 04/07/2011  . 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 Long term current use of anticoagulant 04/07/2011  . NEOPLASM, MALIGNANT, BLADDER 04/10/2009  . HYPERLIPIDEMIA 04/10/2009  .  ANEMIA-NOS 04/10/2009  . 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 ANXIETY 04/10/2009  . HYPERTENSION 04/10/2009  . COPD 04/10/2009  . TRANSIENT ISCHEMIC ATTACK, HX OF 04/10/2009    History  Smoking status  . Never Smoker   Smokeless tobacco  . Never Used    History  Alcohol Use No    Family History  Problem Relation Age of Onset  . Heart disease Mother   . Arthritis Father     Review of Systems: Constitutional: no fever chills diaphoresis or fatigue or  change in weight.  Head and neck: no hearing loss, no epistaxis, no photophobia or visual disturbance. Respiratory: No cough, shortness of breath or wheezing. Cardiovascular: No chest pain peripheral edema, palpitations. Gastrointestinal: No abdominal distention, no abdominal pain, no change in bowel habits hematochezia or melena. Genitourinary: No dysuria, no frequency, no urgency, no nocturia. Musculoskeletal:No arthralgias, no back pain, no gait disturbance or myalgias. Neurological: No dizziness, no headaches, no numbness, no seizures, no syncope, no weakness, no tremors. Hematologic: No lymphadenopathy, no easy bruising. Psychiatric: No confusion, no hallucinations, no sleep disturbance.    Physical Exam: Filed Vitals:   05/02/14 1031  BP: 118/60  Pulse: 117   the general appearance reveals a well-developed elderly gentleman in no distress.The head and neck exam reveals pupils equal and reactive.  Extraocular movements are full.  There is no scleral icterus.  The mouth and pharynx are normal.  The neck is supple.  The carotids reveal no bruits.  The jugular venous pressure is normal.  The  thyroid is not enlarged.  There is no lymphadenopathy.  The chest is clear to percussion and auscultation.  There are no rales or rhonchi.  Expansion of the chest is symmetrical.  The pulse is irregularly irregular The precordium is quiet.  The first heart sound is normal.  The second heart sound is physiologically split.  There is no murmur gallop rub or click.  There is no abnormal lift or heave.  The abdomen is soft and nontender.  The bowel sounds are normal.  The liver and spleen are not enlarged.  There are no abdominal masses.  There are no abdominal bruits.  Extremities reveal good pedal pulses.  There is no phlebitis or edema.  There is no cyanosis or clubbing.  Strength is normal and symmetrical in all extremities.  There is no lateralizing weakness.  There are no sensory deficits.  The skin is  warm and dry.  There is no rash.  EKG shows atrial fibrillation with rapid ventricular response   Assessment / Plan: 1. atrial fibrillation, permanent 2. past history of embolic strokes 3. history of bladder cancer with intermittent gross hematuria 4. labile blood pressure is recent low blood pressure readings associated with feelings of dizziness and lightheadedness  Plan: Continue same medication.  Increase fluid intake and increased salt intake to support blood pressure. Recheck in 3 months for office visit. CBC and basal metabolic panel pending today

## 2014-05-02 NOTE — Assessment & Plan Note (Signed)
The patient is on Xarelto for his atrial fibrillation.  We are checking a CBC and a basal metabolic panel today to be sure that his renal function has not changed enough to warrant a change in his Xarelto dose.  From time to time his gross hematuria necessitates holding his Xarelto for a day

## 2014-05-03 ENCOUNTER — Telehealth: Payer: Self-pay | Admitting: Cardiology

## 2014-05-03 NOTE — Telephone Encounter (Signed)
Message copied by Earvin Hansen on Fri May 03, 2014 12:05 PM ------      Message from: Darlin Coco      Created: Thu May 02, 2014  9:04 PM       Please report to patient.  The recent labs are stable. Continue same medication and careful diet. ------

## 2014-05-03 NOTE — Telephone Encounter (Signed)
Agree with advice given

## 2014-05-03 NOTE — Telephone Encounter (Signed)
Advised patient of lab results  Wife states patient had a nose bleed this am but she was able to get it stopped. Advised to try applying polysporin in his nose at bedtime to see if this would prevent further nose bleeds and to call back if anymore. Will forward to  Dr. Mare Ferrari for review/recommendations.

## 2014-05-03 NOTE — Telephone Encounter (Signed)
New message ° ° ° ° °Want lab results °

## 2014-05-03 NOTE — Telephone Encounter (Signed)
Message copied by Earvin Hansen on Fri May 03, 2014 12:06 PM ------      Message from: Darlin Coco      Created: Thu May 02, 2014  9:04 PM       Please report to patient.  The recent labs are stable. Continue same medication and careful diet. ------

## 2014-05-13 ENCOUNTER — Encounter: Payer: Medicare Other | Attending: Physical Medicine and Rehabilitation

## 2014-05-13 ENCOUNTER — Encounter: Payer: Self-pay | Admitting: Physical Medicine & Rehabilitation

## 2014-05-13 ENCOUNTER — Ambulatory Visit (HOSPITAL_BASED_OUTPATIENT_CLINIC_OR_DEPARTMENT_OTHER): Payer: Medicare Other | Admitting: Physical Medicine & Rehabilitation

## 2014-05-13 VITALS — BP 123/84 | HR 124 | Resp 14 | Wt 120.6 lb

## 2014-05-13 DIAGNOSIS — I6350 Cerebral infarction due to unspecified occlusion or stenosis of unspecified cerebral artery: Secondary | ICD-10-CM | POA: Insufficient documentation

## 2014-05-13 DIAGNOSIS — IMO0002 Reserved for concepts with insufficient information to code with codable children: Secondary | ICD-10-CM | POA: Insufficient documentation

## 2014-05-13 DIAGNOSIS — J4489 Other specified chronic obstructive pulmonary disease: Secondary | ICD-10-CM | POA: Insufficient documentation

## 2014-05-13 DIAGNOSIS — I69919 Unspecified symptoms and signs involving cognitive functions following unspecified cerebrovascular disease: Secondary | ICD-10-CM

## 2014-05-13 DIAGNOSIS — J449 Chronic obstructive pulmonary disease, unspecified: Secondary | ICD-10-CM | POA: Insufficient documentation

## 2014-05-13 DIAGNOSIS — I69398 Other sequelae of cerebral infarction: Secondary | ICD-10-CM

## 2014-05-13 DIAGNOSIS — Z8673 Personal history of transient ischemic attack (TIA), and cerebral infarction without residual deficits: Secondary | ICD-10-CM

## 2014-05-13 DIAGNOSIS — M1710 Unilateral primary osteoarthritis, unspecified knee: Secondary | ICD-10-CM | POA: Insufficient documentation

## 2014-05-13 DIAGNOSIS — I10 Essential (primary) hypertension: Secondary | ICD-10-CM | POA: Insufficient documentation

## 2014-05-13 DIAGNOSIS — R269 Unspecified abnormalities of gait and mobility: Secondary | ICD-10-CM

## 2014-05-13 DIAGNOSIS — I4891 Unspecified atrial fibrillation: Secondary | ICD-10-CM | POA: Insufficient documentation

## 2014-05-13 DIAGNOSIS — I69319 Unspecified symptoms and signs involving cognitive functions following cerebral infarction: Secondary | ICD-10-CM

## 2014-05-13 DIAGNOSIS — Z8669 Personal history of other diseases of the nervous system and sense organs: Secondary | ICD-10-CM

## 2014-05-13 DIAGNOSIS — C679 Malignant neoplasm of bladder, unspecified: Secondary | ICD-10-CM | POA: Insufficient documentation

## 2014-05-13 DIAGNOSIS — I69993 Ataxia following unspecified cerebrovascular disease: Secondary | ICD-10-CM

## 2014-05-13 IMAGING — CT CT HEAD W/O CM
1 of 2 series · 14 of 30 positions shown, 18 images · non-contrast
Comparison: 12/27/2013 CT and MRI and prior examinations.

CLINICAL DATA: 86-year-old male with right upper extremity weakness
and aphasia. Code stroke.

EXAM:
CT HEAD WITHOUT CONTRAST
TECHNIQUE: Contiguous axial images were obtained from the base of the skull
through the vertex without intravenous contrast.

[Series 2: head 5.0 h30s · axial · 0.42mm/px · z∈[-129,+1]mm · 14 of 30 slices shown, 18 images]
[im 2/30  brain]
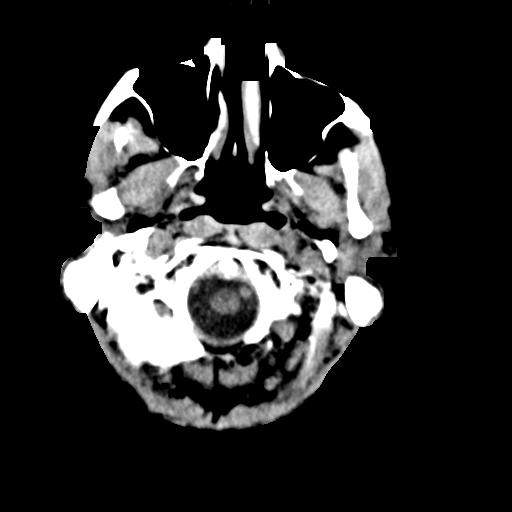
[im 2/30  bone]
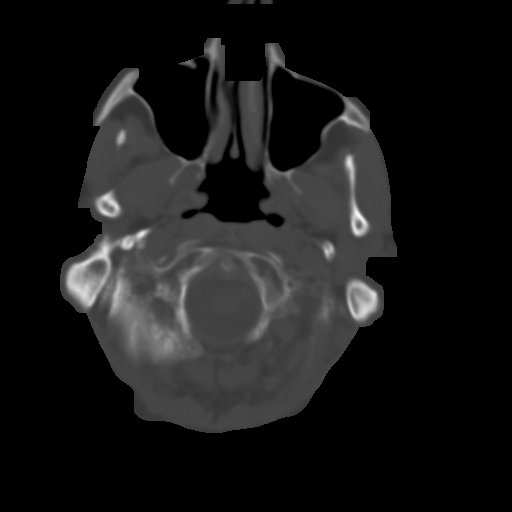
[im 4/30  brain]
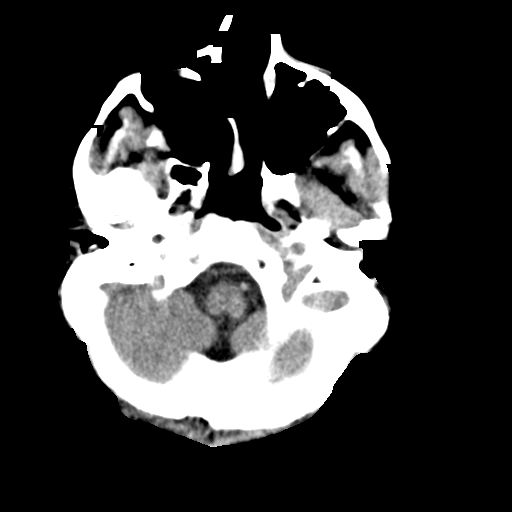
[im 6/30  brain]
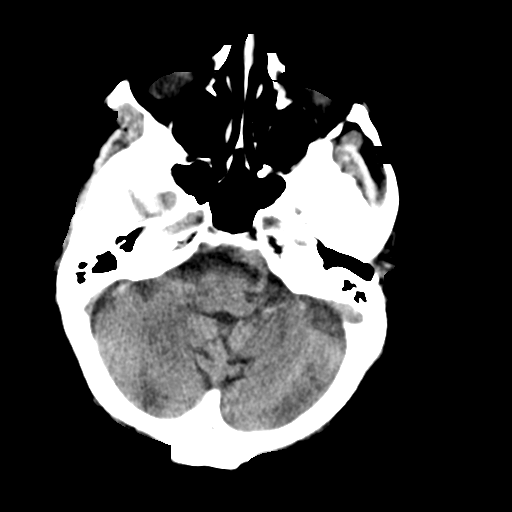
[im 8/30  brain]
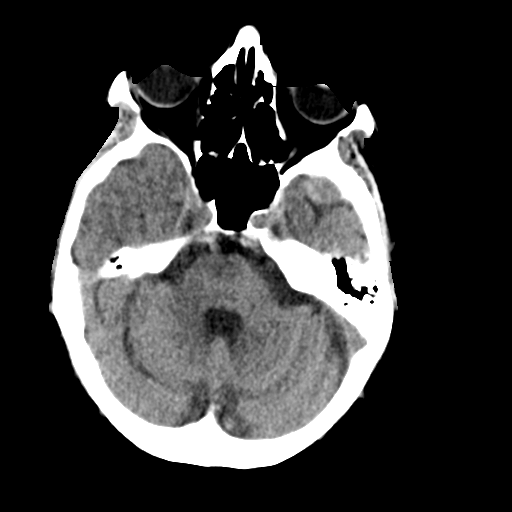
[im 10/30  brain]
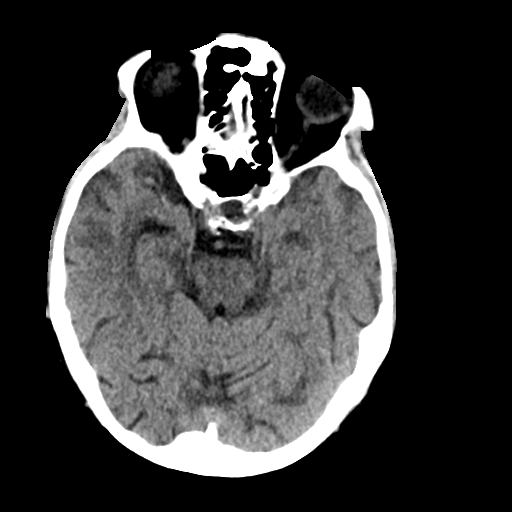
[im 10/30  bone]
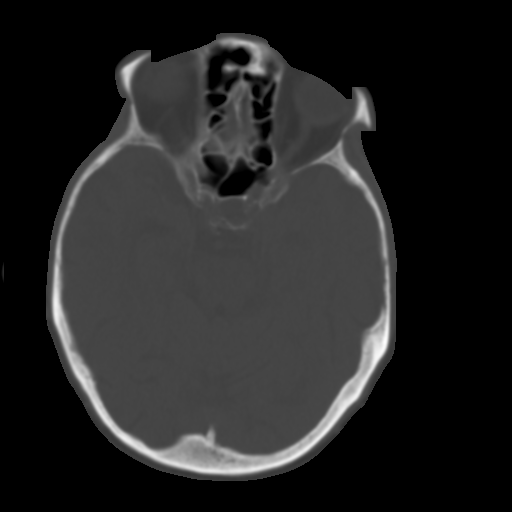
[im 12/30  brain]
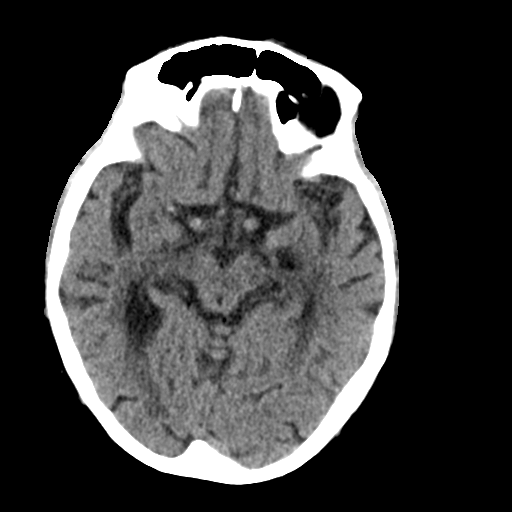
[im 14/30  brain]
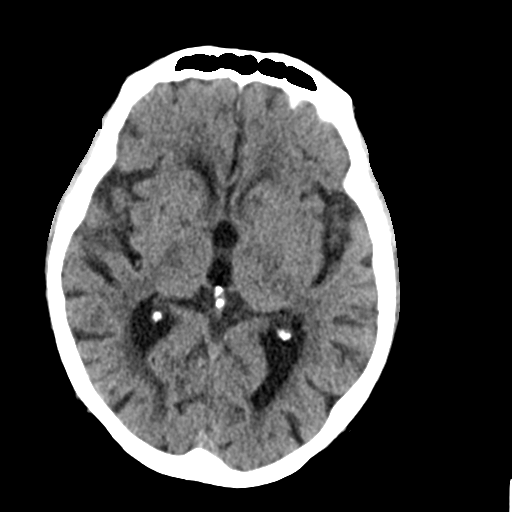
[im 16/30  brain]
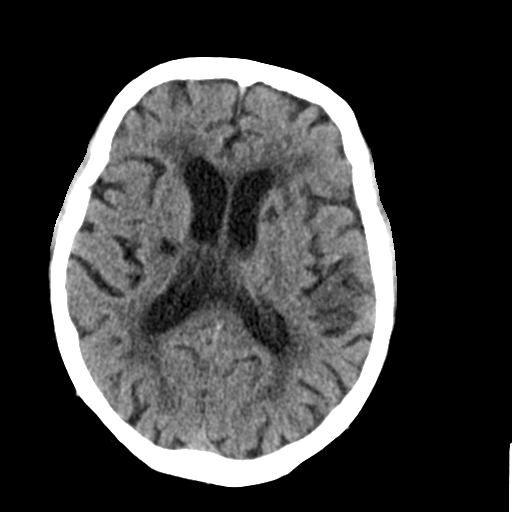
[im 18/30  brain]
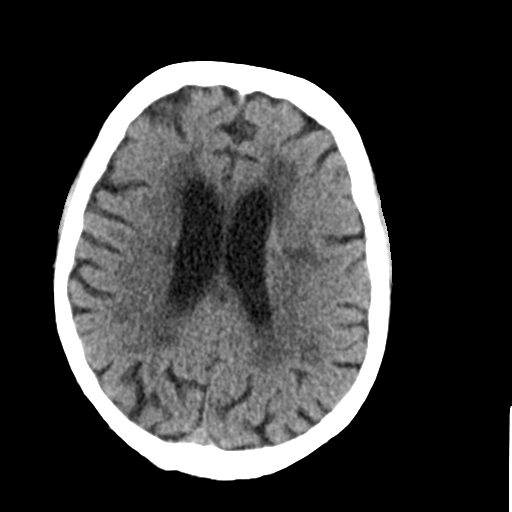
[im 18/30  bone]
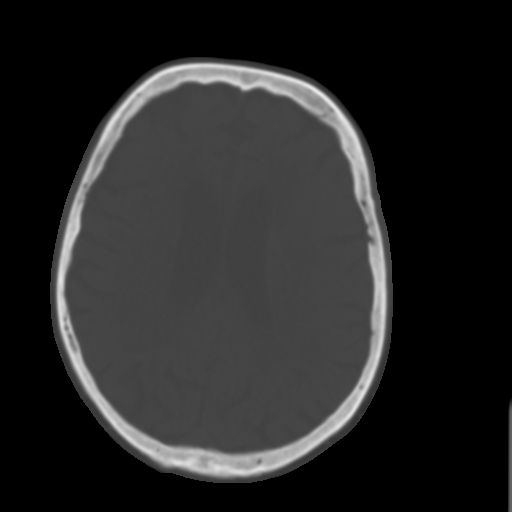
[im 20/30  brain]
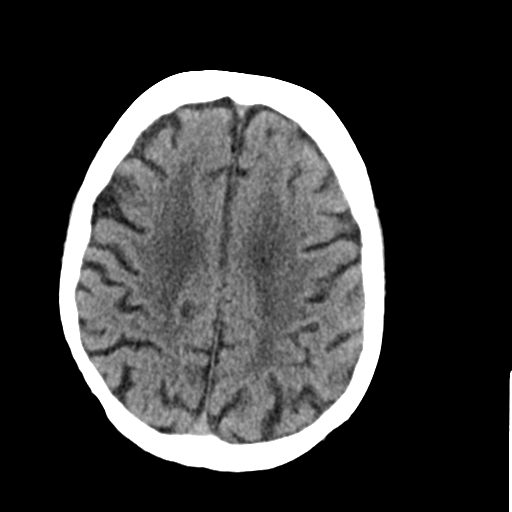
[im 22/30  brain]
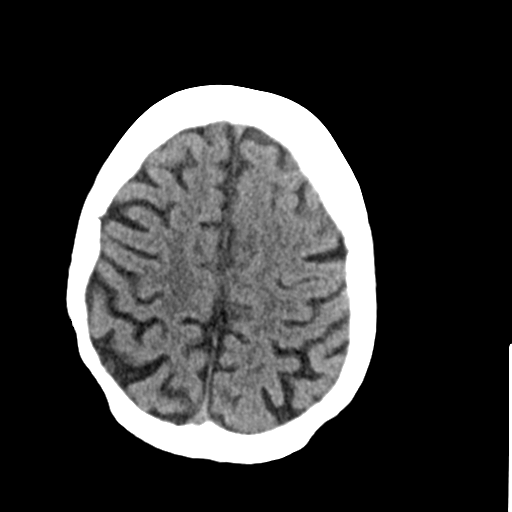
[im 24/30  brain]
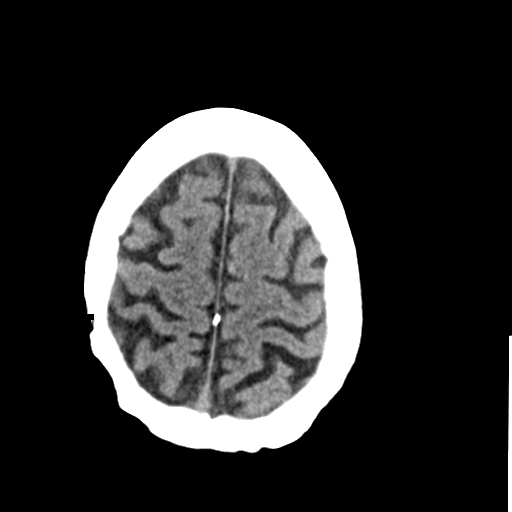
[im 26/30  brain]
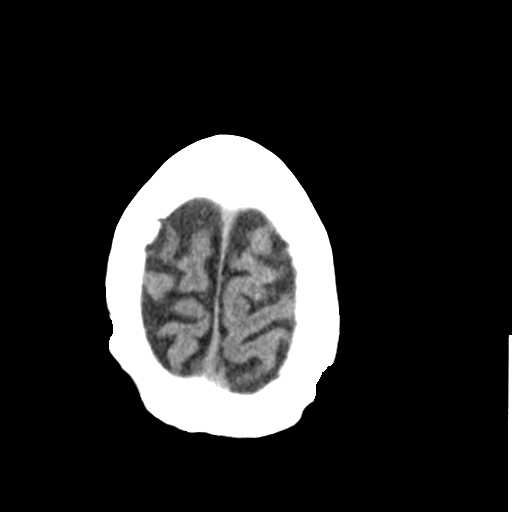
[im 26/30  bone]
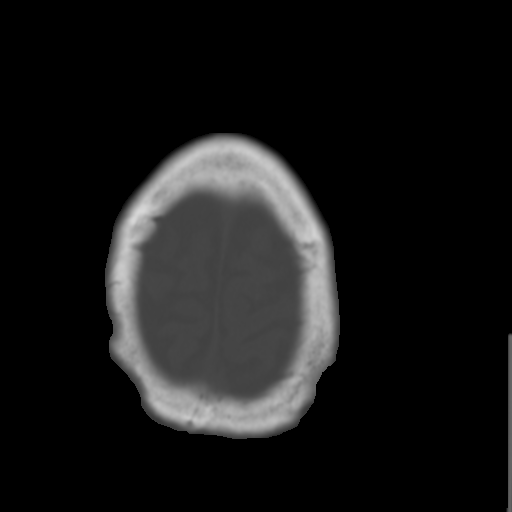
[im 28/30  brain]
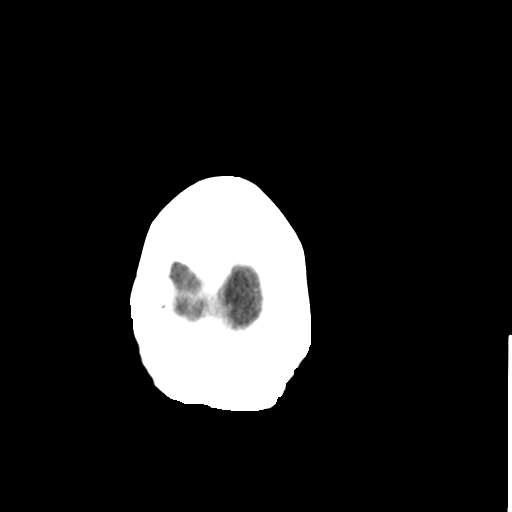

[14 of 30 positions shown; findings below may reference images not displayed]

FINDINGS: Moderate chronic small-vessel white matter ischemic changes,
generalized cerebral volume loss and remote lacunar infarcts within
the deep white matter, basal ganglia and thalami again noted.

No acute intracranial abnormalities are identified, including mass
lesion or mass effect, hydrocephalus, extra-axial fluid collection,
midline shift, hemorrhage, or acute infarction. The visualized bony
calvarium is unremarkable.
IMPRESSION: No evidence of acute intracranial abnormality.

Atrophy, chronic small-vessel white matter ischemic changes and
remote infarcts.

Critical Value/emergent results were called by telephone at the time
of interpretation on 01/05/2014 at [DATE] to Dr. PREMA LOI ,
who verbally acknowledged these results.

## 2014-05-13 NOTE — Progress Notes (Signed)
Subjective:    Patient ID: [name], male    DOB: [birth year], 80 y.o.   MRN: [id number] [name] is a 80 y.o. right handed male with history of COPD,CVA,  ) , A fib and bladder cancer with ongoing treatment at [facility]. He was originally admitted 12/25/2013 with altered mental status and fever due to SIRS as well as flu. Cranial CT scan negative and MRI of the brain showed punctate acute infarct in the high left parietal lobe as well as extensive chronic ischemic changes. Patient's Xarelto held for a short time secondary to hematuria but resumed per neurology recommendations for embolic stroke due to A Fib. He was transferred to CIR on 01/01/14 and  was doing until 01/05/14 when he developed acute onset of right sided weakness, right inattention as well as speech difficulty. He was transferred to acute services for work up and MRI revealed newly seen acute infarction within the left thalamus, left occipital lobe and left temporal lobe. He was maintained on xarelto and Kleb UTI treated with keflex.   He was readmitted to CIR on 01/09/14 and has good recovery in strength of RUE as well as improvement in dysphagia. At discharge he was requires min assist for ADL tasks. He was able to perform transfer and ambulate up to 82' with supervision and use of RW. He was oriented X 4 but required min cues for basic problem solving and moderate cues for working memory as well as  min cues to increase vocal intensity to improve speech intelligibility. He was on dysphagia 3 diet with nectar liquids with meals and water in between meals. He was discharged to home on 02/04/14.   He has completed [place] and has transitioned to outpatient therapy on 02/20/14. He is receiving PT,OT and ST 2-3 times a week. He has had one fall in the bathroom due to difficulty in getting walker in sideways. He has difficulty getting out of low surfaces as well as chairs without armrests. Wife uses the transport chair in dinning room. He  continue to feed himself with his left hand as right hand weakness continues. She helps him complete bathing and dressing tasks.  Wife reports he still drools when relaxed and needs occasional cues to mobilize/swallow secretions.  Short term memory continues to be significantly impaired. Wife provides 24 hours supervision and does have hired help for 5 hrs/week to run errands.   HPI Still not I on bathing or dressing Still uses depends Pt can get shoes and socks on and off Uses shower chair Wife afraid that pt still a fall risk Had a lot of hematuria, worsened with activity 2 bad nose bleeds No falls since March  Still on Nectar thickener with meals Water protocol Chopped meat diets   Remains on Xarelto 15mg  per day for Afib Has had to nose bleeds in the last month or 2. Has not seen an ENT  Pain Inventory Average Pain 0 Pain Right Now 0 My pain is numbness in hand  In the last 24 hours, has pain interfered with the following? General activity 10 Relation with others 10 Enjoyment of life 8 What TIME of day is your pain at its worst? night Sleep (in general) Fair  Pain is worse with: walking Pain improves with: no med Relief from Meds: 2  Mobility use a walker ability to climb steps?  yes do you drive?  no needs help with transfers  Function disabled: date disabled . retired I need assistance with the following:  dressing, bathing, meal prep, household duties and shopping  Neuro/Psych bladder control problems bowel control problems numbness trouble walking dizziness confusion  Prior Studies Any changes since last visit?  no  Physicians involved in your care Any changes since last visit?  no   Family History  Problem Relation Age of Onset  . Heart disease Mother   . Arthritis Father    History   Social History  . Marital Status: Married    Spouse Name: N/A    Number of Children: N/A  . Years of Education: N/A   Occupational History  . 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 Smoking status: Never Smoker   . Smokeless tobacco: Never Used  . Alcohol Use: No  . Drug Use: No  . Sexual Activity: None   Other Topics Concern  . None   Social History Narrative   Wife is [name] - pt. Of [place]   Past Surgical History  Procedure Laterality Date  . Splenectomy    . Bilateral vats ablation    . Facial cancer      facial skin cancer   Past Medical History  Diagnosis Date  . NEOPLASM, MALIGNANT, BLADDER 04/10/2009  . HYPERLIPIDEMIA 04/10/2009  . ANEMIA-NOS 04/10/2009  . 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 ANXIETY 04/10/2009  . HYPERTENSION 04/10/2009  . Atrial fibrillation 04/10/2009  . COPD 04/10/2009  . TRANSIENT ISCHEMIC ATTACK, HX OF 04/10/2009  . Bladder cancer     Dr. Lawerance Bach  . Lacunar stroke 04/07/2011  . Pleural effusion 04/07/2011  . 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 Stroke    BP 123/84  Pulse 124  Resp 14  Wt 120 lb 9.6 oz (54.704 kg)  SpO2 98%  Opioid Risk Score:   Fall Risk Score: High Fall Risk (>13 points) (educated and handout given at previous visit for fall prevention in the home)  Review of Systems  Gastrointestinal:       Bowel Control Problems  Genitourinary:       Bladder control problems  Musculoskeletal: Positive for gait problem.  Neurological: Positive for dizziness and numbness.  Psychiatric/Behavioral: Positive for confusion.  All other systems reviewed and are negative.      Objective:   Physical Exam  Nursing note and vitals reviewed. Constitutional: He appears well-developed and well-nourished.  Psychiatric: He has a normal mood and affect.    4/5 strength in bilateral deltoid, bicep, tricep, grip, hip flexor, knee extensors, ankle dorsiflexor and plantar flexor. Decreased fine motor coordination in the right upper extremity Visual fields are intact the confrontational testing Responses are delayed. Gait unable  to stand with feet together. He can ambulate with a walker widened base of support short step length      Assessment & Plan:  1.Embolic infarction related to AFib  left thalamus, left occipital lobe and left temporal lobe with right upper extremity fine motor deficits as well as gait disorder. In addition he does have cognitive deficits from the stroke mainly with a delay in processing information. For this reason I do not think he is safe to drive. His wife agrees. The patient disagrees about this recommendation.  2. Epistaxis likely related to his Xarelto.  We discussed the risks and benefits of this medication. Certainly given his history of atrial for relation and recurrent strokes this is reasonable. I did give his wife the name of a your nose and throat surgeon should have a  recurrent nose bleed. Will follow up with cardiology in regards to his anticoagulation   Over half of the 25 min visit was spent counseling and coordinating care.

## 2014-05-13 NOTE — Patient Instructions (Addendum)
No Driving   Dr Erik Obey Ear Nose and Throat surgeon,  [facility]

## 2014-06-03 ENCOUNTER — Other Ambulatory Visit: Payer: Medicare Other

## 2014-06-03 ENCOUNTER — Ambulatory Visit: Payer: Medicare Other | Admitting: Cardiology

## 2014-06-06 ENCOUNTER — Encounter (HOSPITAL_COMMUNITY): Payer: Self-pay | Admitting: Emergency Medicine

## 2014-06-06 ENCOUNTER — Emergency Department (HOSPITAL_COMMUNITY)
Admission: EM | Admit: 2014-06-06 | Discharge: 2014-06-07 | Disposition: A | Discharge status: Home / Self Care | Payer: Medicare Other | Attending: Emergency Medicine | Admitting: Emergency Medicine

## 2014-06-06 DIAGNOSIS — Z8551 Personal history of malignant neoplasm of bladder: Secondary | ICD-10-CM | POA: Insufficient documentation

## 2014-06-06 DIAGNOSIS — Z791 Long term (current) use of non-steroidal anti-inflammatories (NSAID): Secondary | ICD-10-CM | POA: Insufficient documentation

## 2014-06-06 DIAGNOSIS — Z85828 Personal history of other malignant neoplasm of skin: Secondary | ICD-10-CM | POA: Insufficient documentation

## 2014-06-06 DIAGNOSIS — R339 Retention of urine, unspecified: Secondary | ICD-10-CM | POA: Insufficient documentation

## 2014-06-06 DIAGNOSIS — Z8659 Personal history of other mental and behavioral disorders: Secondary | ICD-10-CM | POA: Insufficient documentation

## 2014-06-06 DIAGNOSIS — Z8673 Personal history of transient ischemic attack (TIA), and cerebral infarction without residual deficits: Secondary | ICD-10-CM | POA: Insufficient documentation

## 2014-06-06 DIAGNOSIS — Z8639 Personal history of other endocrine, nutritional and metabolic disease: Secondary | ICD-10-CM | POA: Insufficient documentation

## 2014-06-06 DIAGNOSIS — J4489 Other specified chronic obstructive pulmonary disease: Secondary | ICD-10-CM | POA: Insufficient documentation

## 2014-06-06 DIAGNOSIS — I10 Essential (primary) hypertension: Secondary | ICD-10-CM | POA: Insufficient documentation

## 2014-06-06 DIAGNOSIS — Z79899 Other long term (current) drug therapy: Secondary | ICD-10-CM | POA: Insufficient documentation

## 2014-06-06 DIAGNOSIS — R319 Hematuria, unspecified: Secondary | ICD-10-CM | POA: Insufficient documentation

## 2014-06-06 DIAGNOSIS — I4891 Unspecified atrial fibrillation: Secondary | ICD-10-CM | POA: Insufficient documentation

## 2014-06-06 DIAGNOSIS — Z862 Personal history of diseases of the blood and blood-forming organs and certain disorders involving the immune mechanism: Secondary | ICD-10-CM | POA: Insufficient documentation

## 2014-06-06 DIAGNOSIS — J449 Chronic obstructive pulmonary disease, unspecified: Secondary | ICD-10-CM | POA: Insufficient documentation

## 2014-06-06 LAB — URINALYSIS, ROUTINE W REFLEX MICROSCOPIC
BILIRUBIN URINE: NEGATIVE
Glucose, UA: 100 mg/dL — AB
Ketones, ur: 15 mg/dL — AB
Nitrite: NEGATIVE
PH: 6.5 (ref 5.0–8.0)
Protein, ur: >300 mg/dL — AB
Specific Gravity, Urine: 1.025 (ref 1.005–1.030)
Urobilinogen, UA: 1 mg/dL (ref 0.0–1.0)

## 2014-06-06 LAB — URINE MICROSCOPIC-ADD ON

## 2014-06-06 NOTE — ED Notes (Signed)
#  18 foley cath inserted without any problems..  Dark red urine returned.

## 2014-06-06 NOTE — ED Notes (Signed)
Verbal order from MD for patient to go home with urinary catheter and a leg bag.

## 2014-06-06 NOTE — ED Provider Notes (Signed)
CSN: [id number]     Arrival date & time 06/06/14  2002 History   First MD Initiated Contact with Patient 06/06/14 2216     Chief Complaint  Patient presents with  . Urinary Retention     (Consider location/radiation/quality/duration/timing/severity/associated sxs/prior Treatment) The history is provided by the patient.   80 year-old the male status post cystoscopy over to [facility] by Dr. Rosana Hoes. Patient's had long-standing bladder cancer. He is on some roll to 4 previous strokes. Patient when he got home started to have some blood at the tip of the meatus and could not before you. Start to get a lot of discomfort they contacted urology at [facility] family lives in [place] area then they came here for evaluation which was fine. Patient did not have any blood in the urine prior to the procedure. Patient still not able to void.  Past Medical History  Diagnosis Date  . NEOPLASM, MALIGNANT, BLADDER 04/10/2009  . HYPERLIPIDEMIA 04/10/2009  . ANEMIA-NOS 04/10/2009  . 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 ANXIETY 04/10/2009  . HYPERTENSION 04/10/2009  . Atrial fibrillation 04/10/2009  . COPD 04/10/2009  . TRANSIENT ISCHEMIC ATTACK, HX OF 04/10/2009  . Bladder cancer     Dr. Lawerance Bach  . Lacunar stroke 04/07/2011  . Pleural effusion 04/07/2011  . 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 Stroke    Past Surgical History  Procedure Laterality Date  . Splenectomy    . Bilateral vats ablation    . Facial cancer      facial skin cancer   Family History  Problem Relation Age of Onset  . Heart disease Mother   . Arthritis Father    History  Substance Use Topics  . Smoking status: Never Smoker   . Smokeless tobacco: Never Used  . Alcohol Use: No    Review of Systems  Constitutional: Negative for fever.  HENT: Negative for congestion.   Eyes: Positive for redness.  Respiratory: Negative for shortness of breath.   Gastrointestinal: Negative for abdominal pain.  Genitourinary: Positive for hematuria  and difficulty urinating. Negative for dysuria, discharge, scrotal swelling and testicular pain.  Musculoskeletal: Negative for back pain.  Skin: Negative for rash.  Neurological: Negative for headaches.  Hematological: Does not bruise/bleed easily.  Psychiatric/Behavioral: Negative for confusion.      Allergies  Azithromycin; Diazepam; Ezetimibe-simvastatin; Morphine; Prednisone; and Sulfonamide derivatives  Home Medications   Prior to Admission medications   Medication Sig Start Date End Date Taking? Authorizing Provider  Cholecalciferol (VITAMIN D) 2000 UNITS tablet Take 2,000-3,000 Units by mouth daily. Patient takes 2000 units daily, except for taking 3000 units (1.5 tablets) on "winter days" and days when "not feeling well."   Yes Historical Provider, MD  diclofenac sodium (VOLTAREN) 1 % GEL Apply 2 g topically 3 (three) times daily. 03/14/14  Yes Ivan Anchors Love, PA-C  digoxin (LANOXIN) 0.125 MG tablet Take 0.5 tablets (0.0625 mg total) by mouth daily. 01/30/14  Yes Ivan Anchors Love, PA-C  diltiazem (CARDIZEM CD) 120 MG 24 hr capsule Take 1 capsule (120 mg total) by mouth daily. 03/01/14  Yes Darlin Coco, MD  Rivaroxaban (XARELTO) 15 MG TABS tablet Take 1 tablet (15 mg total) by mouth daily. 06/05/13  Yes Darlin Coco, MD  senna-docusate (SENOKOT-S) 8.6-50 MG per tablet Take 1 tablet by mouth at bedtime. For constipation. 01/30/14  Yes Ivan Anchors Love, PA-C   BP 123/67  Pulse 102  Temp(Src) 98 F (36.7 C) (Oral)  Resp 13  Ht 5\' 7"  (1.702 m)  SpO2 98% Physical Exam  Nursing note and vitals reviewed. Constitutional: He is oriented to person, place, and time. He appears well-developed and well-nourished. No distress.  HENT:  Head: Normocephalic and atraumatic.  Mouth/Throat: Oropharynx is clear and moist.  Eyes: Conjunctivae and EOM are normal. Pupils are equal, round, and reactive to light.  Neck: Normal range of motion.  Cardiovascular: Normal rate, regular rhythm and normal  heart sounds.   Pulmonary/Chest: Effort normal and breath sounds normal.  Abdominal: Soft. Bowel sounds are normal. There is no tenderness.  Genitourinary:  Patient with catheter in place a low bit of blood around the meatus otherwise penis is normal no scrotal swelling. No palpable distention of the bladder.  Musculoskeletal: Normal range of motion.  Neurological: He is alert and oriented to person, place, and time.  Patient has an old residual stroke and does have some speech problems.    ED Course  Procedures (including critical care time) Labs Review Labs Reviewed  URINALYSIS, ROUTINE W REFLEX MICROSCOPIC - Abnormal; Notable for the following:    Color, Urine RED (*)    APPearance CLOUDY (*)    Glucose, UA 100 (*)    Hgb urine dipstick LARGE (*)    Ketones, ur 15 (*)    Protein, ur >300 (*)    Leukocytes, UA SMALL (*)    All other components within normal limits  URINE CULTURE  URINE MICROSCOPIC-ADD ON    Imaging Review No results found.   EKG Interpretation None      MDM   Final diagnoses:  Hematuria  Urinary retention    Patient with symptoms consistent with urinary retention however we placed the  Foley catheter and patient felt much better however we only got 250 cc of urine out which should not have been enough to give the sensation of needing to go. Urinalysis here without any direct evidence of urinary tract infection. Does have hematuria mature that's related to the cystoscopy that was done over at [facility] today apparently no biopsies were done it was just a look at the bladder. Patient is on blood thinners he is on several. He's been on that and will remain on that. We'll going to keep the Foley catheter in switch over to a leg bag have the patient followup with his urologist at [facility] first by phone: See when they want to see him. Urine culture was sent.   Fredia Sorrow, MD 06/06/14 [phone]

## 2014-06-06 NOTE — ED Notes (Signed)
Pt has hx of bladder ca, had bladder scope earlier today at [facility].  Wife st's pt has not been able to void since getting home at 3:30pm today.   Pt appears very uncomfortable.

## 2014-06-06 NOTE — ED Notes (Signed)
The pt was seen at [facility] today and had his bladder scoped today.  Hx bladder cancer.  Since the scope he has not been able to void since 1440 today.  C/o pain and chills at present

## 2014-06-06 NOTE — Discharge Instructions (Signed)
Keep the Foley catheter in place. Empty the leg bag when is full. Call urologist at [facility] Dr. Rosana Hoes tomorrow to determine when they would want to remove it. Return here if the urine stopped flowing needed increased pressure again. Could be development of a blood clot in the bladder. Urine culture sent urinalysis here without any direct evidence of urinary tract infection.

## 2014-06-08 LAB — URINE CULTURE
COLONY COUNT: NO GROWTH
Culture: NO GROWTH

## 2014-07-12 ENCOUNTER — Other Ambulatory Visit: Payer: Self-pay | Admitting: Cardiology

## 2014-07-18 ENCOUNTER — Other Ambulatory Visit (HOSPITAL_COMMUNITY): Payer: Self-pay | Admitting: Physical Medicine and Rehabilitation

## 2014-07-20 ENCOUNTER — Emergency Department (HOSPITAL_COMMUNITY): Payer: Medicare Other

## 2014-07-20 ENCOUNTER — Encounter (HOSPITAL_COMMUNITY): Payer: Self-pay | Admitting: Emergency Medicine

## 2014-07-20 ENCOUNTER — Inpatient Hospital Stay (HOSPITAL_COMMUNITY)
Admission: EM | Admit: 2014-07-20 | Discharge: 2014-07-26 | DRG: 064 | Disposition: A | Discharge status: Skilled Nursing Facility | Payer: Medicare Other | Attending: Internal Medicine | Admitting: Internal Medicine

## 2014-07-20 DIAGNOSIS — E860 Dehydration: Secondary | ICD-10-CM | POA: Diagnosis present

## 2014-07-20 DIAGNOSIS — Z85828 Personal history of other malignant neoplasm of skin: Secondary | ICD-10-CM | POA: Diagnosis not present

## 2014-07-20 DIAGNOSIS — M25562 Pain in left knee: Secondary | ICD-10-CM

## 2014-07-20 DIAGNOSIS — R7303 Prediabetes: Secondary | ICD-10-CM

## 2014-07-20 DIAGNOSIS — Z8679 Personal history of other diseases of the circulatory system: Secondary | ICD-10-CM

## 2014-07-20 DIAGNOSIS — R351 Nocturia: Secondary | ICD-10-CM

## 2014-07-20 DIAGNOSIS — D6489 Other specified anemias: Secondary | ICD-10-CM | POA: Diagnosis present

## 2014-07-20 DIAGNOSIS — G9340 Encephalopathy, unspecified: Secondary | ICD-10-CM

## 2014-07-20 DIAGNOSIS — R269 Unspecified abnormalities of gait and mobility: Secondary | ICD-10-CM

## 2014-07-20 DIAGNOSIS — F411 Generalized anxiety disorder: Secondary | ICD-10-CM | POA: Diagnosis present

## 2014-07-20 DIAGNOSIS — R5381 Other malaise: Secondary | ICD-10-CM | POA: Diagnosis present

## 2014-07-20 DIAGNOSIS — E43 Unspecified severe protein-calorie malnutrition: Secondary | ICD-10-CM | POA: Diagnosis present

## 2014-07-20 DIAGNOSIS — A4902 Methicillin resistant Staphylococcus aureus infection, unspecified site: Secondary | ICD-10-CM | POA: Diagnosis present

## 2014-07-20 DIAGNOSIS — J438 Other emphysema: Secondary | ICD-10-CM | POA: Diagnosis present

## 2014-07-20 DIAGNOSIS — M1120 Other chondrocalcinosis, unspecified site: Secondary | ICD-10-CM

## 2014-07-20 DIAGNOSIS — R310 Gross hematuria: Secondary | ICD-10-CM | POA: Diagnosis present

## 2014-07-20 DIAGNOSIS — Z888 Allergy status to other drugs, medicaments and biological substances status: Secondary | ICD-10-CM

## 2014-07-20 DIAGNOSIS — J90 Pleural effusion, not elsewhere classified: Secondary | ICD-10-CM

## 2014-07-20 DIAGNOSIS — H109 Unspecified conjunctivitis: Secondary | ICD-10-CM | POA: Diagnosis present

## 2014-07-20 DIAGNOSIS — R079 Chest pain, unspecified: Secondary | ICD-10-CM | POA: Diagnosis not present

## 2014-07-20 DIAGNOSIS — I6381 Other cerebral infarction due to occlusion or stenosis of small artery: Secondary | ICD-10-CM

## 2014-07-20 DIAGNOSIS — C679 Malignant neoplasm of bladder, unspecified: Secondary | ICD-10-CM | POA: Diagnosis present

## 2014-07-20 DIAGNOSIS — Z9089 Acquired absence of other organs: Secondary | ICD-10-CM

## 2014-07-20 DIAGNOSIS — I129 Hypertensive chronic kidney disease with stage 1 through stage 4 chronic kidney disease, or unspecified chronic kidney disease: Secondary | ICD-10-CM | POA: Diagnosis present

## 2014-07-20 DIAGNOSIS — D693 Immune thrombocytopenic purpura: Secondary | ICD-10-CM

## 2014-07-20 DIAGNOSIS — I69998 Other sequelae following unspecified cerebrovascular disease: Secondary | ICD-10-CM | POA: Diagnosis not present

## 2014-07-20 DIAGNOSIS — I6350 Cerebral infarction due to unspecified occlusion or stenosis of unspecified cerebral artery: Principal | ICD-10-CM | POA: Diagnosis present

## 2014-07-20 DIAGNOSIS — Z0000 Encounter for general adult medical examination without abnormal findings: Secondary | ICD-10-CM

## 2014-07-20 DIAGNOSIS — I10 Essential (primary) hypertension: Secondary | ICD-10-CM | POA: Diagnosis present

## 2014-07-20 DIAGNOSIS — I4891 Unspecified atrial fibrillation: Secondary | ICD-10-CM | POA: Diagnosis present

## 2014-07-20 DIAGNOSIS — Z8673 Personal history of transient ischemic attack (TIA), and cerebral infarction without residual deficits: Secondary | ICD-10-CM

## 2014-07-20 DIAGNOSIS — R319 Hematuria, unspecified: Secondary | ICD-10-CM

## 2014-07-20 DIAGNOSIS — Z681 Body mass index (BMI) 19 or less, adult: Secondary | ICD-10-CM | POA: Diagnosis not present

## 2014-07-20 DIAGNOSIS — Z882 Allergy status to sulfonamides status: Secondary | ICD-10-CM | POA: Diagnosis not present

## 2014-07-20 DIAGNOSIS — N390 Urinary tract infection, site not specified: Secondary | ICD-10-CM | POA: Diagnosis present

## 2014-07-20 DIAGNOSIS — Z7901 Long term (current) use of anticoagulants: Secondary | ICD-10-CM

## 2014-07-20 DIAGNOSIS — Z8551 Personal history of malignant neoplasm of bladder: Secondary | ICD-10-CM | POA: Diagnosis not present

## 2014-07-20 DIAGNOSIS — I69319 Unspecified symptoms and signs involving cognitive functions following cerebral infarction: Secondary | ICD-10-CM

## 2014-07-20 DIAGNOSIS — Z885 Allergy status to narcotic agent status: Secondary | ICD-10-CM | POA: Diagnosis not present

## 2014-07-20 DIAGNOSIS — N182 Chronic kidney disease, stage 2 (mild): Secondary | ICD-10-CM | POA: Diagnosis present

## 2014-07-20 DIAGNOSIS — D72829 Elevated white blood cell count, unspecified: Secondary | ICD-10-CM

## 2014-07-20 DIAGNOSIS — J449 Chronic obstructive pulmonary disease, unspecified: Secondary | ICD-10-CM

## 2014-07-20 DIAGNOSIS — Z9081 Acquired absence of spleen: Secondary | ICD-10-CM

## 2014-07-20 DIAGNOSIS — R7989 Other specified abnormal findings of blood chemistry: Secondary | ICD-10-CM

## 2014-07-20 DIAGNOSIS — I69398 Other sequelae of cerebral infarction: Secondary | ICD-10-CM

## 2014-07-20 DIAGNOSIS — D649 Anemia, unspecified: Secondary | ICD-10-CM | POA: Diagnosis present

## 2014-07-20 DIAGNOSIS — I639 Cerebral infarction, unspecified: Secondary | ICD-10-CM | POA: Diagnosis present

## 2014-07-20 DIAGNOSIS — R410 Disorientation, unspecified: Secondary | ICD-10-CM

## 2014-07-20 DIAGNOSIS — J4489 Other specified chronic obstructive pulmonary disease: Secondary | ICD-10-CM | POA: Diagnosis present

## 2014-07-20 DIAGNOSIS — E785 Hyperlipidemia, unspecified: Secondary | ICD-10-CM | POA: Diagnosis present

## 2014-07-20 DIAGNOSIS — J111 Influenza due to unidentified influenza virus with other respiratory manifestations: Secondary | ICD-10-CM

## 2014-07-20 DIAGNOSIS — Z8249 Family history of ischemic heart disease and other diseases of the circulatory system: Secondary | ICD-10-CM

## 2014-07-20 HISTORY — DX: Permanent atrial fibrillation: I48.21

## 2014-07-20 HISTORY — DX: Gross hematuria: R31.0

## 2014-07-20 HISTORY — DX: Anemia, unspecified: D64.9

## 2014-07-20 HISTORY — DX: Anxiety disorder, unspecified: F41.9

## 2014-07-20 HISTORY — DX: Hyperlipidemia, unspecified: E78.5

## 2014-07-20 HISTORY — DX: Transient cerebral ischemic attack, unspecified: G45.9

## 2014-07-20 HISTORY — DX: Essential (primary) hypertension: I10

## 2014-07-20 LAB — CBC WITH DIFFERENTIAL/PLATELET
BASOS PCT: 0 % (ref 0–1)
Basophils Absolute: 0 10*3/uL (ref 0.0–0.1)
Eosinophils Absolute: 0 10*3/uL (ref 0.0–0.7)
Eosinophils Relative: 0 % (ref 0–5)
HCT: 32.1 % — ABNORMAL LOW (ref 39.0–52.0)
HEMOGLOBIN: 10.8 g/dL — AB (ref 13.0–17.0)
Lymphocytes Relative: 8 % — ABNORMAL LOW (ref 12–46)
Lymphs Abs: 0.7 10*3/uL (ref 0.7–4.0)
MCH: 30.2 pg (ref 26.0–34.0)
MCHC: 33.6 g/dL (ref 30.0–36.0)
MCV: 89.7 fL (ref 78.0–100.0)
MONO ABS: 0.6 10*3/uL (ref 0.1–1.0)
MONOS PCT: 7 % (ref 3–12)
NEUTROS ABS: 7.4 10*3/uL (ref 1.7–7.7)
Neutrophils Relative %: 85 % — ABNORMAL HIGH (ref 43–77)
Platelets: 217 10*3/uL (ref 150–400)
RBC: 3.58 MIL/uL — ABNORMAL LOW (ref 4.22–5.81)
RDW: 14.4 % (ref 11.5–15.5)
WBC: 8.7 10*3/uL (ref 4.0–10.5)

## 2014-07-20 LAB — HEPATIC FUNCTION PANEL
ALT: 9 U/L (ref 0–53)
AST: 17 U/L (ref 0–37)
Albumin: 3.1 g/dL — ABNORMAL LOW (ref 3.5–5.2)
Alkaline Phosphatase: 89 U/L (ref 39–117)
Bilirubin, Direct: <0.2 mg/dL (ref 0.0–0.3)
Total Bilirubin: 0.7 mg/dL (ref 0.3–1.2)
Total Protein: 7.2 g/dL (ref 6.0–8.3)

## 2014-07-20 LAB — BASIC METABOLIC PANEL
Anion gap: 12 (ref 5–15)
BUN: 21 mg/dL (ref 6–23)
CALCIUM: 9.2 mg/dL (ref 8.4–10.5)
CHLORIDE: 94 meq/L — AB (ref 96–112)
CO2: 28 meq/L (ref 19–32)
CREATININE: 0.91 mg/dL (ref 0.50–1.35)
GFR calc non Af Amer: 76 mL/min — ABNORMAL LOW (ref >90)
GFR, EST AFRICAN AMERICAN: 88 mL/min — AB (ref >90)
Glucose, Bld: 99 mg/dL (ref 70–99)
Potassium: 4.2 mEq/L (ref 3.7–5.3)
Sodium: 134 mEq/L — ABNORMAL LOW (ref 137–147)

## 2014-07-20 LAB — TROPONIN I
Troponin I: <0.3 ng/mL (ref <0.30)
Troponin I: <0.3 ng/mL (ref <0.30)

## 2014-07-20 LAB — DIGOXIN LEVEL: Digoxin Level: <0.3 ng/mL — ABNORMAL LOW (ref 0.8–2.0)

## 2014-07-20 LAB — URINALYSIS, ROUTINE W REFLEX MICROSCOPIC
Glucose, UA: NEGATIVE mg/dL
Ketones, ur: 15 mg/dL — AB
Nitrite: NEGATIVE
PH: 6.5 (ref 5.0–8.0)
Protein, ur: 100 mg/dL — AB
Specific Gravity, Urine: 1.018 (ref 1.005–1.030)
Urobilinogen, UA: 1 mg/dL (ref 0.0–1.0)

## 2014-07-20 LAB — URINE MICROSCOPIC-ADD ON

## 2014-07-20 LAB — MRSA PCR SCREENING: MRSA by PCR: POSITIVE — AB

## 2014-07-20 LAB — LIPASE, BLOOD: Lipase: 22 U/L (ref 11–59)

## 2014-07-20 MED ORDER — DIGOXIN 0.0625 MG HALF TABLET
0.0625 mg | ORAL_TABLET | Freq: Every day | ORAL | Status: DC
  Administered 2014-07-20 – 2014-07-22 (×3): 0.0625 mg via ORAL
  Filled 2014-07-20 (×3): qty 1

## 2014-07-20 MED ORDER — SODIUM CHLORIDE 0.9 % IV SOLN
INTRAVENOUS | Status: DC
  Administered 2014-07-20: 10 mL via INTRAVENOUS

## 2014-07-20 MED ORDER — RIVAROXABAN 15 MG PO TABS
15.0000 mg | ORAL_TABLET | Freq: Every day | ORAL | Status: DC
  Administered 2014-07-20 – 2014-07-25 (×6): 15 mg via ORAL
  Filled 2014-07-20 (×7): qty 1

## 2014-07-20 MED ORDER — SODIUM CHLORIDE 0.9 % IV BOLUS (SEPSIS)
500.0000 mL | Freq: Once | INTRAVENOUS | Status: AC
  Administered 2014-07-20: 500 mL via INTRAVENOUS

## 2014-07-20 MED ORDER — DIGOXIN 0.0625 MG HALF TABLET: 0.0625 mg | ORAL_TABLET | Freq: Every day | ORAL | Status: DC

## 2014-07-20 MED ORDER — ONDANSETRON HCL 4 MG/2ML IJ SOLN: 4.0000 mg | Freq: Four times a day (QID) | INTRAMUSCULAR | Status: DC | PRN

## 2014-07-20 MED ORDER — ACETAMINOPHEN 650 MG RE SUPP: 650.0000 mg | Freq: Four times a day (QID) | RECTAL | Status: DC | PRN

## 2014-07-20 MED ORDER — ONDANSETRON HCL 4 MG PO TABS: 4.0000 mg | ORAL_TABLET | Freq: Four times a day (QID) | ORAL | Status: DC | PRN

## 2014-07-20 MED ORDER — SODIUM CHLORIDE 0.9 % IJ SOLN
3.0000 mL | Freq: Two times a day (BID) | INTRAMUSCULAR | Status: DC
  Administered 2014-07-20 – 2014-07-23 (×5): 3 mL via INTRAVENOUS
  Administered 2014-07-23: 23:00:00 via INTRAVENOUS
  Administered 2014-07-24 – 2014-07-26 (×4): 3 mL via INTRAVENOUS

## 2014-07-20 MED ORDER — BACITRACIN-POLYMYXIN B OP OINT: TOPICAL_OINTMENT | OPHTHALMIC | Status: DC

## 2014-07-20 MED ORDER — MUPIROCIN 2 % EX OINT
1.0000 "application " | TOPICAL_OINTMENT | Freq: Two times a day (BID) | CUTANEOUS | Status: AC
  Administered 2014-07-20 – 2014-07-25 (×10): 1 via NASAL
  Filled 2014-07-20 (×2): qty 22

## 2014-07-20 MED ORDER — CHLORHEXIDINE GLUCONATE CLOTH 2 % EX PADS
6.0000 | MEDICATED_PAD | Freq: Every day | CUTANEOUS | Status: AC
  Administered 2014-07-21 – 2014-07-25 (×4): 6 via TOPICAL

## 2014-07-20 MED ORDER — DILTIAZEM HCL 100 MG IV SOLR
5.0000 mg/h | INTRAVENOUS | Status: DC
  Administered 2014-07-21: 10 mg/h via INTRAVENOUS
  Filled 2014-07-20: qty 100

## 2014-07-20 MED ORDER — DILTIAZEM HCL 100 MG IV SOLR
5.0000 mg/h | Freq: Once | INTRAVENOUS | Status: AC
Start: 2014-07-20 — End: 2014-07-20
  Administered 2014-07-20: 5 mg/h via INTRAVENOUS

## 2014-07-20 MED ORDER — DEXTROSE 5 % IV SOLN
1.0000 g | Freq: Once | INTRAVENOUS | Status: AC
  Administered 2014-07-20: 1 g via INTRAVENOUS
  Filled 2014-07-20: qty 10

## 2014-07-20 MED ORDER — BACITRACIN-POLYMYXIN B 500-10000 UNIT/GM OP OINT
TOPICAL_OINTMENT | OPHTHALMIC | Status: DC
  Administered 2014-07-20: 1 via OPHTHALMIC
  Administered 2014-07-20 – 2014-07-24 (×23): via OPHTHALMIC
  Administered 2014-07-24 – 2014-07-25 (×3): 1 via OPHTHALMIC
  Administered 2014-07-25 – 2014-07-26 (×6): via OPHTHALMIC
  Filled 2014-07-20 (×2): qty 3.5

## 2014-07-20 MED ORDER — ACETAMINOPHEN 325 MG PO TABS: 650.0000 mg | ORAL_TABLET | Freq: Four times a day (QID) | ORAL | Status: DC | PRN

## 2014-07-20 NOTE — Progress Notes (Signed)
Attempted to call for report. No answer.  

## 2014-07-20 NOTE — ED Provider Notes (Signed)
CSN: [id number]     Arrival date & time 07/20/14  1045 History   First MD Initiated Contact with Patient 07/20/14 1117     Chief Complaint  Patient presents with  . Chest Pain      Patient is a 80 y.o. male presenting with altered mental status. The history is provided by a caregiver, a relative and the spouse. The history is limited by the condition of the patient.  Altered Mental Status Presenting symptoms: confusion   Severity:  Moderate Timing:  Constant Progression:  Improving Chronicity:  Recurrent Associated symptoms comment:  Chest pain  pt presents from home for multiple issues  Pt has h/o CVA, and wife noted this morning pt was more confused and difficult to arouse or get out of bed.  Son also noted that he had some slurred speech while talking last night.    Also told wife earlier that he had CP but was unable to describe this any further  Pt has h/o bladder cancer, has condom catheter at baseline and wife thinks he may have UTI  He also has h/o atrial fibrillation, and has had elevated heart rate   Per wife, after patient received oxygen by EMS his confusion improved No falls reported recently per family   Past Medical History  Diagnosis Date  . NEOPLASM, MALIGNANT, BLADDER 04/10/2009  . HYPERLIPIDEMIA 04/10/2009  . ANEMIA-NOS 04/10/2009  . 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 ANXIETY 04/10/2009  . HYPERTENSION 04/10/2009  . Atrial fibrillation 04/10/2009  . COPD 04/10/2009  . TRANSIENT ISCHEMIC ATTACK, HX OF 04/10/2009  . Bladder cancer     Dr. Lawerance Bach  . Lacunar stroke 04/07/2011  . Pleural effusion 04/07/2011  . 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 Stroke    Past Surgical History  Procedure Laterality Date  . Splenectomy    . Bilateral vats ablation    . Facial cancer      facial skin cancer   Family History  Problem Relation Age of Onset  . Heart disease Mother   . Arthritis Father    History  Substance Use Topics  . Smoking status: Never  Smoker   . Smokeless tobacco: Never Used  . Alcohol Use: No    Review of Systems  Unable to perform ROS: Mental status change  Respiratory: Positive for cough.   Psychiatric/Behavioral: Positive for confusion.      Allergies  Azithromycin; Diazepam; Ezetimibe-simvastatin; Morphine; Prednisone; and Sulfonamide derivatives  Home Medications   Prior to Admission medications   Medication Sig Start Date End Date Taking? Authorizing Provider  Cholecalciferol (VITAMIN D) 2000 UNITS tablet Take 2,000 Units by mouth daily.    Yes Historical Provider, MD  diclofenac sodium (VOLTAREN) 1 % GEL Apply 2 g topically every morning.   Yes Historical Provider, MD  digoxin (LANOXIN) 0.125 MG tablet Take 0.5 tablets (0.0625 mg total) by mouth daily. 01/30/14  Yes Ivan Anchors Love, PA-C  diltiazem (CARDIZEM CD) 120 MG 24 hr capsule Take 1 capsule (120 mg total) by mouth daily. 03/01/14  Yes Darlin Coco, MD  Rivaroxaban (XARELTO) 15 MG TABS tablet Take 15 mg by mouth daily.   Yes Historical Provider, MD   BP 108/62  Pulse 136  Temp(Src) 98.5 F (36.9 C) (Oral)  Resp 20  Ht 5\' 9"  (1.753 m)  Wt 140 lb (63.504 kg)  BMI 20.67 kg/m2  SpO2 96% Physical Exam CONSTITUTIONAL: frail, elderly HEAD: Normocephalic/atraumatic EYES: EOMI/PERRL ENMT: Mucous membranes moist NECK: supple no meningeal signs SPINE:entire spine nontender  CV: irregular, tachycardic LUNGS:coarse BS noted bilaterally, no distress noted ABDOMEN: soft, nontender, no rebound or guarding GU:no cva tenderness NEURO: Pt is awake/alert,no arm drift noted.  No facial droop noted. He is slow to respond and does not answer all questions during interview EXTREMITIES: pulses normal, full ROM SKIN: warm, color normal PSYCH: unable to assess  ED Course  Procedures   CRITICAL CARE Performed by: Sharyon Cable Total critical care time: 33 Critical care time was exclusive of separately billable procedures and treating other  patients. Critical care was necessary to treat or prevent imminent or life-threatening deterioration. Critical care was time spent personally by me on the following activities: development of treatment plan with patient and/or surrogate as well as nursing, discussions with consultants, evaluation of patient's response to treatment, examination of patient, obtaining history from patient or surrogate, ordering and performing treatments and interventions, ordering and review of laboratory studies, ordering and review of radiographic studies, pulse oximetry and re-evaluation of patient's condition.   12:46 PM Pt with multiple issues, but appears to be in atrial fibrillation with RVR and requiring cardizem drip Also due to confusion will obtain CT head (pt on xarelto for afib) Will follow closely 2:00 PM Pt improving while on cardizem for afib He is awake/alert UTI noted and rocephin ordered (this was spontaneous void urine) D/w dr Wyline Copas with [facility] will admit  Pt/family agreeable with plan  Labs Review Labs Reviewed  CBC WITH DIFFERENTIAL - Abnormal; Notable for the following:    RBC 3.58 (*)    Hemoglobin 10.8 (*)    HCT 32.1 (*)    Neutrophils Relative % 85 (*)    Lymphocytes Relative 8 (*)    All other components within normal limits  BASIC METABOLIC PANEL - Abnormal; Notable for the following:    Sodium 134 (*)    Chloride 94 (*)    GFR calc non Af Amer 76 (*)    GFR calc Af Amer 88 (*)    All other components within normal limits  HEPATIC FUNCTION PANEL - Abnormal; Notable for the following:    Albumin 3.1 (*)    All other components within normal limits  DIGOXIN LEVEL - Abnormal; Notable for the following:    Digoxin Level <0.3 (*)    All other components within normal limits  URINE CULTURE  TROPONIN I  LIPASE, BLOOD  URINALYSIS, ROUTINE W REFLEX MICROSCOPIC    Imaging Review Dg Chest Portable 1 View  07/20/2014   CLINICAL DATA:  Chest pain  EXAM: PORTABLE CHEST - 1 VIEW   COMPARISON:  January 06, 2014  FINDINGS: There is underlying emphysematous change. There are areas of mild scarring bilaterally. There is no frank edema or consolidation. Heart is upper normal in size with pulmonary vascularity reflecting the underlying emphysematous change. There is stable apical pleural thickening bilaterally, slightly more on the left than on the right. No adenopathy. There is atherosclerotic change in the aorta.  IMPRESSION: Underlying emphysema with areas of lung scarring. No edema or consolidation.   Electronically Signed   By: Lowella Grip M.D.   On: 07/20/2014 12:40     EKG Interpretation   Date/Time:  Saturday July 20 2014 10:51:08 EDT Ventricular Rate:  120 PR Interval:    QRS Duration: 86 QT Interval:  332 QTC Calculation: 469 R Axis:   105 Text Interpretation:  Atrial fibrillation Right axis deviation Low  voltage, extremity leads Confirmed by Christy Gentles  MD, Kailee Essman ([id number]) on  07/20/2014 10:59:11 AM  MDM   Final diagnoses:  UTI (lower urinary tract infection)  Atrial fibrillation with rapid ventricular response  Dehydration    Nursing notes including past medical history and social history reviewed and considered in documentation xrays reviewed and considered Labs/vital reviewed and considered     Sharyon Cable, MD 07/20/14 1401

## 2014-07-20 NOTE — ED Notes (Signed)
Pt denies CP at this time 

## 2014-07-20 NOTE — H&P (Addendum)
[facility] History and Physical  [name] BTD:[id number] DOB: Jul 29, 1927 DOA: 07/20/2014  Referring physician: Emergency Department PCP: Cathlean Cower, MD  Specialists:   Chief Complaint: Chest pain  HPI: [name] is a 80 y.o. male  With a hx of HTN, afib, bladder cancer, CVA, COPD who presents to the ED with complaints of chest pains. In the ED, pt noted to have HR in the 130-160 range, started on NS. Pt also noted to have large blood and leukocytes in urine. Pt started on empiric rocephin and cardizem gtt initiated. Initial trop was neg. Hospitalist service consulted for consideration for admission.  Pt currently chest pain free and denies sob.  Review of Systems: Per above, the remainder of the 10pt ros reviewed and are neg  Past Medical History  Diagnosis Date  . NEOPLASM, MALIGNANT, BLADDER 04/10/2009  . HYPERLIPIDEMIA 04/10/2009  . ANEMIA-NOS 04/10/2009  . 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 ANXIETY 04/10/2009  . HYPERTENSION 04/10/2009  . Atrial fibrillation 04/10/2009  . COPD 04/10/2009  . TRANSIENT ISCHEMIC ATTACK, HX OF 04/10/2009  . Bladder cancer     Dr. Lawerance Bach  . Lacunar stroke 04/07/2011  . Pleural effusion 04/07/2011  . 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 Stroke    Past Surgical History  Procedure Laterality Date  . Splenectomy    . Bilateral vats ablation    . Facial cancer      facial skin cancer   Social History:  reports that he has never smoked. He has never used smokeless tobacco. He reports that he does not drink alcohol or use illicit drugs.  where does patient live--home, ALF, SNF? and with whom if at home?  Can patient participate in ADLs?  Allergies  Allergen Reactions  . Azithromycin Other (See Comments)    Altered mental status  . Diazepam Other (See Comments)    REACTION: agitation  . Ezetimibe-Simvastatin Other (See Comments)    unknown  . Morphine Other (See Comments)    Hallucinations.  . Prednisone Other (See Comments)     Dizziness  . Sulfonamide Derivatives Hives    Family History  Problem Relation Age of Onset  . Heart disease Mother   . Arthritis Father     (be sure to complete)  Prior to Admission medications   Medication Sig Start Date End Date Taking? Authorizing Provider  Cholecalciferol (VITAMIN D) 2000 UNITS tablet Take 2,000 Units by mouth daily.    Yes Historical Provider, MD  diclofenac sodium (VOLTAREN) 1 % GEL Apply 2 g topically every morning.   Yes Historical Provider, MD  digoxin (LANOXIN) 0.125 MG tablet Take 0.5 tablets (0.0625 mg total) by mouth daily. 01/30/14  Yes Ivan Anchors Love, PA-C  diltiazem (CARDIZEM CD) 120 MG 24 hr capsule Take 1 capsule (120 mg total) by mouth daily. 03/01/14  Yes Darlin Coco, MD  Rivaroxaban (XARELTO) 15 MG TABS tablet Take 15 mg by mouth daily.   Yes Historical Provider, MD   Physical Exam: Filed Vitals:   07/20/14 1145 07/20/14 1200 07/20/14 1248 07/20/14 1300  BP: 108/62 118/59 99/60 103/66  Pulse: 136  92 108  Temp:      TempSrc:      Resp: 20 20 19 16   Height:      Weight:      SpO2: 96%  95% 94%     General:  Awake, in nad  Eyes: PERRL B  ENT: membranes moist, dentition fair  Neck: trachea midline, neck supple  Cardiovascular: tachycardic, s1, s2  Respiratory: normal resp effort, no wheezing  Abdomen: soft, nondistended,   Skin: normal skin, no abnormal skin lesions seen  Musculoskeletal: perfused, no clubbing  Psychiatric: mood/affect normal // no auditory/visual hallucinations  Neurologic: cn2-12 grossly intact, strength/sensation intact  Labs on Admission:  Basic Metabolic Panel:  Recent Labs Lab 07/20/14 1132  NA 134*  K 4.2  CL 94*  CO2 28  GLUCOSE 99  BUN 21  CREATININE 0.91  CALCIUM 9.2   Liver Function Tests:  Recent Labs Lab 07/20/14 1132  AST 17  ALT 9  ALKPHOS 89  BILITOT 0.7  PROT 7.2  ALBUMIN 3.1*    Recent Labs Lab 07/20/14 1132  LIPASE 22   No results found for this basename:  AMMONIA,  in the last 168 hours CBC:  Recent Labs Lab 07/20/14 1132  WBC 8.7  NEUTROABS 7.4  HGB 10.8*  HCT 32.1*  MCV 89.7  PLT 217   Cardiac Enzymes:  Recent Labs Lab 07/20/14 1132  TROPONINI <0.30    BNP (last 3 results) No results found for this basename: PROBNP,  in the last 8760 hours CBG: No results found for this basename: GLUCAP,  in the last 168 hours  Radiological Exams on Admission: Ct Head Wo Contrast  07/20/2014   CLINICAL DATA:  Generalized weakness and mental status change.  EXAM: CT HEAD WITHOUT CONTRAST  TECHNIQUE: Contiguous axial images were obtained from the base of the skull through the vertex without intravenous contrast.  COMPARISON:  01/05/2014.  FINDINGS: Stable age related cerebral atrophy, ventriculomegaly, periventricular white matter disease and remote lacunar-type basal ganglia infarcts. Evolutionary change in the left PCA infarct noted on the prior MRI with encephalomalacia. No definite acute infarct and no intracranial hemorrhage, mass lesion or extra-axial fluid collection.  The bony structures are intact. The paranasal sinuses and mastoid air cells are grossly clear. The globes are intact.  IMPRESSION: 1. Stable age related cerebral atrophy, ventriculomegaly and periventricular white matter disease. 2. Remote lacunar-type basal ganglia infarcts an remote left PCA infarct. 3. No acute intracranial findings, mass lesion or skull fracture.   Electronically Signed   By: Kalman Jewels M.D.   On: 07/20/2014 13:23   Dg Chest Portable 1 View  07/20/2014   CLINICAL DATA:  Chest pain  EXAM: PORTABLE CHEST - 1 VIEW  COMPARISON:  January 06, 2014  FINDINGS: There is underlying emphysematous change. There are areas of mild scarring bilaterally. There is no frank edema or consolidation. Heart is upper normal in size with pulmonary vascularity reflecting the underlying emphysematous change. There is stable apical pleural thickening bilaterally, slightly more on  the left than on the right. No adenopathy. There is atherosclerotic change in the aorta.  IMPRESSION: Underlying emphysema with areas of lung scarring. No edema or consolidation.   Electronically Signed   By: Lowella Grip M.D.   On: 07/20/2014 12:40    EKG: Independently reviewed. Rapid afib  Assessment/Plan Principal Problem:   Atrial fibrillation with rapid ventricular response Active Problems:   NEOPLASM, MALIGNANT, BLADDER   ANEMIA-NOS   HYPERTENSION   COPD   CVA (cerebral vascular accident)-Acute right small nonhemorrhagic infarct seen on MRI   UTI (lower urinary tract infection)  1. Afib with rvr 1. Cardizem gtt started in ED 2. Will cont and titrate to normalize as needed to keep resting HR<100 3. On chronic anticoagulation 2. Hematuria 1. Likely related to hx of bladder cancer with concurrent anticoagulation 2. Family strongly requests xarelto to be continued despite gross  hematuria. In the presence of bedside nurse, family is aware of risks of continued bleeding, including death, but are still demanding xarelto be continued in light of prior strokes.  3. Chronic anemia 1. Hgb stable 2. Monitor closely 3. Cont on xarelto per family request (see above) 4. Copd 1. Stable 5. Hx cva 1. Stable 6. Suspected uti 1. Rocephin started in ED 2. Will cont for now 3. Follow cultures 7. dvt prophylaxis 1. SCD's 8. Suspected Conjunctivitis 1. Family reports B red eyes and "eyes glued shut in the morning" per wife 2. Will start empiric abx eye drops  Code Status: Full (must indicate code status--if unknown or must be presumed, indicate so) Family Communication: Pt, wife, and son in room Disposition Plan: Pending stability of patient (indicate anticipated LOS)  Time spent: 14min  Keishaun Hazel, [facility] Pager [phone]  If 7PM-7AM, please contact night-coverage [email or web address] Password [email or web address] 07/20/2014, 2:05 PM

## 2014-07-20 NOTE — ED Notes (Signed)
Patient complains of chest pain in the epigastric region.EMS states family said he has been confused since yesterday. He has not taken his medications because he is unable to swallow. EMS stated he was A-Fib 130-160 heart rate. He received a bolus and 250 LR. Vitals were BP 114/60 temp 97.6 Glucose 106. He was 87% and was placed on 2L of O2 94%. EMS stated he has a history of CVA. Afib, and bladder cancer.

## 2014-07-20 NOTE — ED Notes (Signed)
Attempted report x1. 

## 2014-07-21 DIAGNOSIS — I10 Essential (primary) hypertension: Secondary | ICD-10-CM

## 2014-07-21 LAB — URINE CULTURE

## 2014-07-21 LAB — TROPONIN I: Troponin I: <0.3 ng/mL (ref <0.30)

## 2014-07-21 LAB — CBC
HEMATOCRIT: 29 % — AB (ref 39.0–52.0)
HEMOGLOBIN: 9.8 g/dL — AB (ref 13.0–17.0)
MCH: 30.5 pg (ref 26.0–34.0)
MCHC: 33.8 g/dL (ref 30.0–36.0)
MCV: 90.3 fL (ref 78.0–100.0)
Platelets: 228 10*3/uL (ref 150–400)
RBC: 3.21 MIL/uL — AB (ref 4.22–5.81)
RDW: 14.7 % (ref 11.5–15.5)
WBC: 7 10*3/uL (ref 4.0–10.5)

## 2014-07-21 LAB — COMPREHENSIVE METABOLIC PANEL
ALBUMIN: 2.6 g/dL — AB (ref 3.5–5.2)
ALT: 13 U/L (ref 0–53)
ANION GAP: 9 (ref 5–15)
AST: 27 U/L (ref 0–37)
Alkaline Phosphatase: 81 U/L (ref 39–117)
BILIRUBIN TOTAL: 0.5 mg/dL (ref 0.3–1.2)
BUN: 21 mg/dL (ref 6–23)
CHLORIDE: 98 meq/L (ref 96–112)
CO2: 28 mEq/L (ref 19–32)
CREATININE: 0.98 mg/dL (ref 0.50–1.35)
Calcium: 8.8 mg/dL (ref 8.4–10.5)
GFR calc Af Amer: 85 mL/min — ABNORMAL LOW (ref >90)
GFR calc non Af Amer: 73 mL/min — ABNORMAL LOW (ref >90)
Glucose, Bld: 99 mg/dL (ref 70–99)
Potassium: 4.9 mEq/L (ref 3.7–5.3)
Sodium: 135 mEq/L — ABNORMAL LOW (ref 137–147)
TOTAL PROTEIN: 6.6 g/dL (ref 6.0–8.3)

## 2014-07-21 MED ORDER — DILTIAZEM HCL ER COATED BEADS 120 MG PO CP24
120.0000 mg | ORAL_CAPSULE | Freq: Every day | ORAL | Status: DC
  Administered 2014-07-21 – 2014-07-22 (×2): 120 mg via ORAL
  Filled 2014-07-21 (×2): qty 1

## 2014-07-21 MED ORDER — DILTIAZEM HCL 100 MG IV SOLR
5.0000 mg/h | INTRAVENOUS | Status: DC
  Filled 2014-07-21: qty 100

## 2014-07-21 NOTE — Progress Notes (Signed)
[facility] PROGRESS NOTE  Arik Husmann WUJ:[id number] DOB: 1927/07/20 DOA: 07/20/2014 PCP: Cathlean Cower, MD  Assessment/Plan: 1. Afib with rvr  1. Cardizem gtt started in ED 2. Will cont and titrate to normalize as needed to keep resting HR<100 3. On chronic anticoagulation per below 4. HR now near baseline, will resume home PO cardizem and attempt to wean off gtt 2. Hematuria  1. Likely related to hx of bladder cancer with concurrent anticoagulation 2. Family had requested xarelto to be continued despite gross hematuria. See admit h and p. 3. Chronic anemia  1. Hgb from 10.8 to 9.8 2. Monitor closely 3. Cont on xarelto per family request (see above). Patient and family are aware of risk of cont anticoagulation in the face of bleeding. 4. Copd  1. Stable 5. Hx cva  1. Stable 6. Suspected uti  1. Rocephin started in ED 2. Follow cultures, pending 7. dvt prophylaxis  1. On xarelto 8. Suspected Conjunctivitis  1. Pt with B red eyes and "eyes glued shut in the morning" per wife 2. Started empiric abx eye drops  Code Status: Full Family Communication: Pt and family in room (indicate person spoken with, relationship, and if by phone, the number) Disposition Plan: Pending   Consultants:    Procedures:    Antibiotics:  Rocephin 7/25>>> (indicate start date, and stop date if known)  HPI/Subjective: No acute events noted overnight. Pt noted to feel better today.  Objective: Filed Vitals:   07/21/14 0900 07/21/14 1048 07/21/14 1100 07/21/14 1149  BP: 101/49 93/52 98/41    Pulse:    76  Temp:    97.4 F (36.3 C)  TempSrc:    Oral  Resp: 10 15    Height:      Weight:      SpO2: 99% 100%  100%    Intake/Output Summary (Last 24 hours) at 07/21/14 1229 Last data filed at 07/21/14 0800  Gross per 24 hour  Intake 487.59 ml  Output    600 ml  Net -112.41 ml   Filed Weights   07/20/14 1056 07/20/14 1720  Weight: 63.504 kg (140 lb) 53.2 kg (117 lb 4.6 oz)     Exam:   General:  Awake, in nad  Cardiovascular: Irregularly irregular, s1, s2  Respiratory: normal resp effort, no wheezing  Abdomen: soft,nondistended  Musculoskeletal: perfused, no clubbing   Data Reviewed: Basic Metabolic Panel:  Recent Labs Lab 07/20/14 1132 07/21/14 0022  NA 134* 135*  K 4.2 4.9  CL 94* 98  CO2 28 28  GLUCOSE 99 99  BUN 21 21  CREATININE 0.91 0.98  CALCIUM 9.2 8.8   Liver Function Tests:  Recent Labs Lab 07/20/14 1132 07/21/14 0022  AST 17 27  ALT 9 13  ALKPHOS 89 81  BILITOT 0.7 0.5  PROT 7.2 6.6  ALBUMIN 3.1* 2.6*    Recent Labs Lab 07/20/14 1132  LIPASE 22   No results found for this basename: AMMONIA,  in the last 168 hours CBC:  Recent Labs Lab 07/20/14 1132 07/21/14 0022  WBC 8.7 7.0  NEUTROABS 7.4  --   HGB 10.8* 9.8*  HCT 32.1* 29.0*  MCV 89.7 90.3  PLT 217 228   Cardiac Enzymes:  Recent Labs Lab 07/20/14 1132 07/20/14 1820 07/21/14 0022 07/21/14 0615  TROPONINI <0.30 <0.30 <0.30 <0.30   BNP (last 3 results) No results found for this basename: PROBNP,  in the last 8760 hours CBG: No results found for this basename: GLUCAP,  in  the last 168 hours  Recent Results (from the past 240 hour(s))  MRSA PCR SCREENING     Status: Abnormal   Collection Time    07/20/14  5:17 PM      Result Value Ref Range Status   MRSA by PCR POSITIVE (*) NEGATIVE Final   Comment:            The GeneXpert MRSA Assay (FDA     approved for NASAL specimens     only), is one component of a     comprehensive MRSA colonization     surveillance program. It is not     intended to diagnose MRSA     infection nor to guide or     monitor treatment for     MRSA infections.     RESULT CALLED TO, READ BACK BY AND VERIFIED WITH:     Hiram Gash RN AT 1909 07/20/14 BY WOOLLENK     Studies: Ct Head Wo Contrast  07/20/2014   CLINICAL DATA:  Generalized weakness and mental status change.  EXAM: CT HEAD WITHOUT CONTRAST   TECHNIQUE: Contiguous axial images were obtained from the base of the skull through the vertex without intravenous contrast.  COMPARISON:  01/05/2014.  FINDINGS: Stable age related cerebral atrophy, ventriculomegaly, periventricular white matter disease and remote lacunar-type basal ganglia infarcts. Evolutionary change in the left PCA infarct noted on the prior MRI with encephalomalacia. No definite acute infarct and no intracranial hemorrhage, mass lesion or extra-axial fluid collection.  The bony structures are intact. The paranasal sinuses and mastoid air cells are grossly clear. The globes are intact.  IMPRESSION: 1. Stable age related cerebral atrophy, ventriculomegaly and periventricular white matter disease. 2. Remote lacunar-type basal ganglia infarcts an remote left PCA infarct. 3. No acute intracranial findings, mass lesion or skull fracture.   Electronically Signed   By: Kalman Jewels M.D.   On: 07/20/2014 13:23   Dg Chest Portable 1 View  07/20/2014   CLINICAL DATA:  Chest pain  EXAM: PORTABLE CHEST - 1 VIEW  COMPARISON:  January 06, 2014  FINDINGS: There is underlying emphysematous change. There are areas of mild scarring bilaterally. There is no frank edema or consolidation. Heart is upper normal in size with pulmonary vascularity reflecting the underlying emphysematous change. There is stable apical pleural thickening bilaterally, slightly more on the left than on the right. No adenopathy. There is atherosclerotic change in the aorta.  IMPRESSION: Underlying emphysema with areas of lung scarring. No edema or consolidation.   Electronically Signed   By: Lowella Grip M.D.   On: 07/20/2014 12:40    Scheduled Meds: . bacitracin-polymyxin b   Both Eyes 6 times per day  . Chlorhexidine Gluconate Cloth  6 each Topical Q0600  . digoxin  0.0625 mg Oral Daily  . diltiazem  120 mg Oral Daily  . mupirocin ointment  1 application Nasal BID  . Rivaroxaban  15 mg Oral Q supper  . sodium chloride   3 mL Intravenous Q12H   Continuous Infusions: . sodium chloride 10 mL (07/20/14 2053)  . diltiazem (CARDIZEM) infusion      Principal Problem:   Atrial fibrillation with rapid ventricular response Active Problems:   NEOPLASM, MALIGNANT, BLADDER   ANEMIA-NOS   HYPERTENSION   COPD   CVA (cerebral vascular accident)-Acute right small nonhemorrhagic infarct seen on MRI   UTI (lower urinary tract infection)  Time spent: 69min [facility], [facility] Pager [phone]. If 7PM-7AM, please contact  night-coverage at [email or web address], password [email or web address] 07/21/2014, 12:29 PM  LOS: 1 day

## 2014-07-22 DIAGNOSIS — C679 Malignant neoplasm of bladder, unspecified: Secondary | ICD-10-CM

## 2014-07-22 DIAGNOSIS — G9340 Encephalopathy, unspecified: Secondary | ICD-10-CM

## 2014-07-22 DIAGNOSIS — E43 Unspecified severe protein-calorie malnutrition: Secondary | ICD-10-CM | POA: Insufficient documentation

## 2014-07-22 DIAGNOSIS — I6350 Cerebral infarction due to unspecified occlusion or stenosis of unspecified cerebral artery: Principal | ICD-10-CM

## 2014-07-22 LAB — CBC
HEMATOCRIT: 30.7 % — AB (ref 39.0–52.0)
HEMOGLOBIN: 10.1 g/dL — AB (ref 13.0–17.0)
MCH: 29.9 pg (ref 26.0–34.0)
MCHC: 32.9 g/dL (ref 30.0–36.0)
MCV: 90.8 fL (ref 78.0–100.0)
Platelets: 228 10*3/uL (ref 150–400)
RBC: 3.38 MIL/uL — AB (ref 4.22–5.81)
RDW: 14.4 % (ref 11.5–15.5)
WBC: 6.5 10*3/uL (ref 4.0–10.5)

## 2014-07-22 MED ORDER — DIGOXIN 125 MCG PO TABS
0.1250 mg | ORAL_TABLET | Freq: Once | ORAL | Status: AC
  Administered 2014-07-22: 0.125 mg via ORAL
  Filled 2014-07-22: qty 1

## 2014-07-22 MED ORDER — DILTIAZEM HCL ER 60 MG PO CP12
60.0000 mg | ORAL_CAPSULE | Freq: Once | ORAL | Status: AC
  Administered 2014-07-22: 60 mg via ORAL
  Filled 2014-07-22: qty 1

## 2014-07-22 MED ORDER — DIGOXIN 125 MCG PO TABS
0.1250 mg | ORAL_TABLET | Freq: Every day | ORAL | Status: DC
  Administered 2014-07-23 – 2014-07-25 (×3): 0.125 mg via ORAL
  Filled 2014-07-22 (×4): qty 1

## 2014-07-22 MED ORDER — DILTIAZEM HCL ER COATED BEADS 180 MG PO CP24
180.0000 mg | ORAL_CAPSULE | Freq: Every day | ORAL | Status: DC
  Administered 2014-07-23 – 2014-07-25 (×3): 180 mg via ORAL
  Filled 2014-07-22 (×3): qty 1

## 2014-07-22 NOTE — Evaluation (Signed)
Physical Therapy Evaluation Patient Details Name: [name] MRN: [id number] DOB: 22-Nov-1927 Today's Date: 07/22/2014   History of Present Illness  Pt is an 80 y/o male admitted with A-fib with rapid ventricular response. Pt coming from home where wife was assisting with most mobility and ADL's.   Clinical Impression  Pt admitted with the above. Pt currently with functional limitations due to the deficits listed below (see PT Problem List). Feel that pt would be a good candidate for a CIR stay to decrease burden of care on wife at home. Wife states that they have a good "system" at home, and that he generally does well. Pt will benefit from skilled PT to increase their independence and safety with mobility to allow discharge to the venue listed below.        Follow Up Recommendations CIR    Equipment Recommendations  None recommended by PT    Recommendations for Other Services Rehab consult     Precautions / Restrictions Precautions Precautions: Fall Precaution Comments: Residual weakness on R side from previous stroke. Restrictions Weight Bearing Restrictions: No      Mobility  Bed Mobility Overal bed mobility: Needs Assistance Bed Mobility: Rolling;Sidelying to Sit Rolling: Min assist Sidelying to sit: Mod assist       General bed mobility comments: VC's for sequencing and technique. Pt able to reach with L UE for railing and initiate scooting of hips around. Bed pad used to advance hips towards the EOB.   Transfers Overall transfer level: Needs assistance Equipment used: Rolling walker (2 wheeled) Transfers: Sit to/from [facility] Sit to Stand: Mod assist Stand pivot transfers: Min assist       General transfer comment: Pt was able to power-up to full standing position with assist, especially for balance as pt achieved full standing position. VC's for hand placement on seated surface for safety and anterior weight shift for balance. Min assist  for balance and cues for sequencing while SPT to chair.   Ambulation/Gait             General Gait Details: Pt willing to ambulate, however gait training deferred due to drop in O2 saturations to 79%. RN notified.   Stairs            Wheelchair Mobility    Modified Rankin (Stroke Patients Only)       Balance Overall balance assessment: Needs assistance Sitting-balance support: Feet supported;No upper extremity supported Sitting balance-Leahy Scale: Fair     Standing balance support: Bilateral upper extremity supported;During functional activity Standing balance-Leahy Scale: Poor                               Pertinent Vitals/Pain Pt on RA throughout session. During transfer bed to chair sats decreased to 79%. Upon seated rest break improvement noted however took several minutes to improve back into the 90's.     Home Living Family/patient expects to be discharged to:: Inpatient rehab                      Prior Function Level of Independence: Needs assistance   Gait / Transfers Assistance Needed: Able to ambulate ~15 feet with RW and wife assist.   ADL's / Homemaking Assistance Needed: Wife assisting with all ADL's, able to self-feed with L hand.         Hand Dominance   Dominant Hand: Right    Extremity/Trunk Assessment  Upper Extremity Assessment: RUE deficits/detail RUE Deficits / Details: Residual weakness from previous stroke. Wife states he can hold a toothbrush or fork, however cannot functionally use them without dropping.          Lower Extremity Assessment: Generalized weakness;RLE deficits/detail RLE Deficits / Details: Residual weakness from previous stroke.    Cervical / Trunk Assessment: Kyphotic  Communication   Communication: Expressive difficulties (Difficult to understand - pt speaks softly)  Cognition Arousal/Alertness: Awake/alert Behavior During Therapy: WFL for tasks assessed/performed Overall  Cognitive Status: Within Functional Limits for tasks assessed                      General Comments      Exercises        Assessment/Plan    PT Assessment Patient needs continued PT services  PT Diagnosis Difficulty walking;Generalized weakness   PT Problem List Decreased strength;Decreased range of motion;Decreased activity tolerance;Decreased balance;Decreased mobility;Decreased safety awareness;Decreased knowledge of use of DME;Decreased knowledge of precautions;Cardiopulmonary status limiting activity  PT Treatment Interventions DME instruction;Gait training;Stair training;Functional mobility training;Therapeutic activities;Therapeutic exercise;Neuromuscular re-education;Patient/family education   PT Goals (Current goals can be found in the Care Plan section) Acute Rehab PT Goals Patient Stated Goal: None stated by pt, however wife's goal is for pt to return to CIR prior to returning home.  PT Goal Formulation: With patient/family Time For Goal Achievement: 08/05/14 Potential to Achieve Goals: Fair    Frequency Min 3X/week   Barriers to discharge        Co-evaluation               End of Session Equipment Utilized During Treatment: Gait belt Activity Tolerance: Patient tolerated treatment well Patient left: in chair;with chair alarm set;with call bell/phone within reach;with family/visitor present Nurse Communication: Mobility status;Other (comment) (O2 status)         Time: 5916-3846 PT Time Calculation (min): 40 min   Charges:   PT Evaluation $Initial PT Evaluation Tier I: 1 Procedure PT Treatments $Therapeutic Activity: 23-37 mins   PT G Codes:          Jolyn Lent 07/22/2014, 3:20 PM  Jolyn Lent, PT, DPT Acute Rehabilitation Services Pager: [phone]

## 2014-07-22 NOTE — Progress Notes (Signed)
INITIAL NUTRITION ASSESSMENT  DOCUMENTATION CODES Per approved criteria  -Severe malnutrition in the context of chronic illness -Underweight   INTERVENTION: Magic cup TID with meals, each supplement provides 290 kcal and 9 grams of protein  Reviewed pt's diet with pt and wife. Encouraged pt to incorporate nut butters, whole fat yogurt, and homeland creamery butter into foods. Pt agreeable to trying some additional foods but remains steadfast in keeping diet very healthy.   NUTRITION DIAGNOSIS: Malnutrition related to chronic illness as evidenced by 11% weight loss x 6 months and severe fat and muscle depletion.   Goal: Pt to meet >/= 90% of their estimated nutrition needs   Monitor:  PO intake, supplement acceptance, weight trend, labs  Reason for Assessment: MD Consult  80 y.o. male  Admitting Dx: Atrial fibrillation with rapid ventricular response  ASSESSMENT: Pt admitted with chest pain in a fib with RVR.  Pt and wife provide hx. Per report pt was dx with bladder ca about 5 years ago. Pt has been told by a physician at [facility] that sugar feeds cancer so he now refused to consume foods high in sugar. Pt eat a very particular healthy diet that is low in calories and fat. Breakfast: oatmeal cooked with prunes, 2 T cottage cheese, flax seed oil, raw peppers/tomato, 3 slices of banana, and aboiled egg cut in 6 pieces.  Lunch/Dinner: chicken, 2 vegetables, raw greens or slaw, and a piece of bread.  Pt only drinks water and V8 juice.  Pt started to eat a magic cup daily after he was last d/c'ed in 2/15. Pt was on experimental treatment for his cancer until his recent stroke.   Nutrition Focused Physical Exam:  Subcutaneous Fat:  Orbital Region: mild/moderate depletion  Upper Arm Region: severe depletion  Thoracic and Lumbar Region: severe depletion   Muscle:  Temple Region: severe depletion  Clavicle Bone Region: severe depletion  Clavicle and Acromion Bone Region: severe  depletion  Scapular Bone Region: severe depletion  Dorsal Hand: severe depletion  Patellar Region: severe depletion  Anterior Thigh Region: severe depletion  Posterior Calf Region: severe depletion   Edema: not present   Height: Ht Readings from Last 1 Encounters:  07/20/14 5\' 9"  (1.753 m)    Weight: Wt Readings from Last 1 Encounters:  07/20/14 117 lb 4.6 oz (53.2 kg)    Ideal Body Weight: 65.9 kg   % Ideal Body Weight: 81%  Wt Readings from Last 10 Encounters:  07/20/14 117 lb 4.6 oz (53.2 kg)  05/13/14 120 lb 9.6 oz (54.704 kg)  05/02/14 120 lb (54.432 kg)  03/28/14 120 lb (54.432 kg)  03/14/14 121 lb (54.885 kg)  03/01/14 122 lb (55.339 kg)  01/30/14 127 lb 13.9 oz (58 kg)  01/09/14 125 lb 3.2 oz (56.79 kg)  01/02/14 131 lb 9.8 oz (59.7 kg)  12/25/13 171 lb 6.4 oz (77.747 kg)    Usual Body Weight: 131 lb 2/15  % Usual Body Weight: 89%  BMI:  Body mass index is 17.31 kg/(m^2).  Estimated Nutritional Needs: Kcal: 1500-1700 Protein: 75-90 grams Fluid: > 1.5 L/day  Skin: intact  Diet Order: Cardiac Meal Completion: 75%  EDUCATION NEEDS: -Education needs addressed   Intake/Output Summary (Last 24 hours) at 07/22/14 1129 Last data filed at 07/22/14 1100  Gross per 24 hour  Intake   1120 ml  Output   1650 ml  Net   -530 ml    Last BM: 7/26   Labs:   Recent [employer]  07/20/14 1132 07/21/14 0022  NA 134* 135*  K 4.2 4.9  CL 94* 98  CO2 28 28  BUN 21 21  CREATININE 0.91 0.98  CALCIUM 9.2 8.8  GLUCOSE 99 99    CBG (last 3)  No results found for this basename: GLUCAP,  in the last 72 hours  Scheduled Meds: . bacitracin-polymyxin b   Both Eyes 6 times per day  . Chlorhexidine Gluconate Cloth  6 each Topical Q0600  . digoxin  0.0625 mg Oral Daily  . diltiazem  120 mg Oral Daily  . mupirocin ointment  1 application Nasal BID  . Rivaroxaban  15 mg Oral Q supper  . sodium chloride  3 mL Intravenous Q12H    Continuous Infusions: .  sodium chloride 10 mL (07/20/14 2053)  . diltiazem (CARDIZEM) infusion      Past Medical History  Diagnosis Date  . NEOPLASM, MALIGNANT, BLADDER 04/10/2009  . HYPERLIPIDEMIA 04/10/2009  . ANEMIA-NOS 04/10/2009  . 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 ANXIETY 04/10/2009  . HYPERTENSION 04/10/2009  . Atrial fibrillation 04/10/2009  . COPD 04/10/2009  . TRANSIENT ISCHEMIC ATTACK, HX OF 04/10/2009  . Bladder cancer     Dr. Lawerance Bach  . Lacunar stroke 04/07/2011  . Pleural effusion 04/07/2011  . 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 Stroke     Past Surgical History  Procedure Laterality Date  . Splenectomy    . Bilateral vats ablation    . Facial cancer      facial skin cancer    Maylon Peppers RD, LDN, CNSC [phone] Pager [phone] After Hours Pager

## 2014-07-22 NOTE — Progress Notes (Signed)
[facility] PROGRESS NOTE  [name] SWF:[id number] DOB: [birth year] DOA: 07/20/2014 PCP: Cathlean Cower, MD  Assessment/Plan: 1. Afib with rvr  1. Cardizem gtt was started in ED and eventually weaned to off 2. On chronic anticoagulation per below 3. HR remains labile 4. Dig level sub therapeutic, will load dilantin w/ increased dose of 0.125mg  5. Will increase cardizem to 180mg . 2. Hematuria  1. Likely related to hx of bladder cancer with concurrent anticoagulation 2. Family cont to request xarelto to be continued despite gross hematuria. See admit h and p. 3. Chronic anemia  1. Hgb stable at 10.1 2. Monitor closely 3. Cont on xarelto per family request (see above) 4. Copd  1. Stable 5. Hx cva  1. Stable 6. Suspected uti  1. Rocephin started in ED 2. Follow cultures, pending 7. dvt prophylaxis  1. On xarelto 8. Suspected Conjunctivitis  1. Pt with B red eyes and "eyes glued shut in the morning" per wife 2. Started empiric abx eye drops  Code Status: Full Family Communication: Pt and family in room  Disposition Plan: Pending CIR consult   Consultants:    Procedures:    Antibiotics:  Rocephin 7/25>>> (indicate start date, and stop date if known)  HPI/Subjective: Pt w/o complaints.  Objective: Filed Vitals:   07/22/14 0600 07/22/14 0700 07/22/14 1218 07/22/14 1715  BP: 116/62 103/56 115/80 118/72  Pulse:      Temp:  98 F (36.7 C) 97.4 F (36.3 C) 97.6 F (36.4 C)  TempSrc:  Oral Oral Oral  Resp: 19 16 18 16   Height:      Weight:      SpO2: 98% 98% 98% 97%    Intake/Output Summary (Last 24 hours) at 07/22/14 1729 Last data filed at 07/22/14 1100  Gross per 24 hour  Intake    550 ml  Output   1000 ml  Net   -450 ml   Filed Weights   07/20/14 1056 07/20/14 1720  Weight: 63.504 kg (140 lb) 53.2 kg (117 lb 4.6 oz)    Exam:   General:  Awake, in nad  Cardiovascular: Irregularly irregular, s1, s2  Respiratory: normal resp effort, no  wheezing  Abdomen: soft,nondistended  Musculoskeletal: perfused, no clubbing   Data Reviewed: Basic Metabolic Panel:  Recent Labs Lab 07/20/14 1132 07/21/14 0022  NA 134* 135*  K 4.2 4.9  CL 94* 98  CO2 28 28  GLUCOSE 99 99  BUN 21 21  CREATININE 0.91 0.98  CALCIUM 9.2 8.8   Liver Function Tests:  Recent Labs Lab 07/20/14 1132 07/21/14 0022  AST 17 27  ALT 9 13  ALKPHOS 89 81  BILITOT 0.7 0.5  PROT 7.2 6.6  ALBUMIN 3.1* 2.6*    Recent Labs Lab 07/20/14 1132  LIPASE 22   No results found for this basename: AMMONIA,  in the last 168 hours CBC:  Recent Labs Lab 07/20/14 1132 07/21/14 0022 07/22/14 0805  WBC 8.7 7.0 6.5  NEUTROABS 7.4  --   --   HGB 10.8* 9.8* 10.1*  HCT 32.1* 29.0* 30.7*  MCV 89.7 90.3 90.8  PLT 217 228 228   Cardiac Enzymes:  Recent Labs Lab 07/20/14 1132 07/20/14 1820 07/21/14 0022 07/21/14 0615  TROPONINI <0.30 <0.30 <0.30 <0.30   BNP (last 3 results) No results found for this basename: PROBNP,  in the last 8760 hours CBG: No results found for this basename: GLUCAP,  in the last 168 hours  Recent Results (from the past 240  hour(s))  URINE CULTURE     Status: None   Collection Time    07/20/14 12:37 PM      Result Value Ref Range Status   Specimen Description URINE, CLEAN CATCH   Final   Special Requests NONE   Final   Culture  Setup Time     Final   Value: 07/20/2014 19:21     Performed at [facility]     Final   Value: 45,000 COLONIES/ML     Performed at [facility]   Culture     Final   Value: Multiple bacterial morphotypes present, none predominant. Suggest appropriate recollection if clinically indicated.     Performed at [facility]   Report Status 07/21/2014 FINAL   Final  MRSA PCR SCREENING     Status: Abnormal   Collection Time    07/20/14  5:17 PM      Result Value Ref Range Status   MRSA by PCR POSITIVE (*) NEGATIVE Final   Comment:            The  GeneXpert MRSA Assay (FDA     approved for NASAL specimens     only), is one component of a     comprehensive MRSA colonization     surveillance program. It is not     intended to diagnose MRSA     infection nor to guide or     monitor treatment for     MRSA infections.     RESULT CALLED TO, READ BACK BY AND VERIFIED WITH:     Hiram Gash RN AT 1909 07/20/14 BY Karie Chimera     Studies: No results found.  Scheduled Meds: . bacitracin-polymyxin b   Both Eyes 6 times per day  . Chlorhexidine Gluconate Cloth  6 each Topical Q0600  . digoxin  0.0625 mg Oral Daily  . diltiazem  120 mg Oral Daily  . mupirocin ointment  1 application Nasal BID  . Rivaroxaban  15 mg Oral Q supper  . sodium chloride  3 mL Intravenous Q12H   Continuous Infusions: . sodium chloride 10 mL (07/20/14 2053)  . diltiazem (CARDIZEM) infusion      Principal Problem:   Atrial fibrillation with rapid ventricular response Active Problems:   NEOPLASM, MALIGNANT, BLADDER   ANEMIA-NOS   HYPERTENSION   COPD   CVA (cerebral vascular accident)-Acute right small nonhemorrhagic infarct seen on MRI   UTI (lower urinary tract infection)   Protein-calorie malnutrition, severe  Time spent: 15min Uri Covey, [facility] Pager [phone]. If 7PM-7AM, please contact night-coverage at [email or web address], password [facility] 07/22/2014, 5:29 PM  LOS: 2 days

## 2014-07-22 NOTE — Progress Notes (Signed)
Rehab Admissions Coordinator Note:  Patient was screened by Cleatrice Burke for appropriateness for an Inpatient Acute Rehab Consult per PT recommendation. Pt previously admitted to inpt rehab 12/2013. AARP medicare will not likely consider an inpt rehab admission without a diagnosis of a new CVA , but we can pursue an inpt rehab admission if Rehab MD feels pt would be appropriate. I recommend an inpt rehab consult if you would like to pursue an admission  .Cleatrice Burke 07/22/2014, 3:28 PM  I can be reached at [phone].

## 2014-07-22 NOTE — Care Management Note (Addendum)
    Page 1 of 1   07/26/2014     3:07:49 PM CARE MANAGEMENT NOTE 07/26/2014  Patient:  LORAINE, FREID   Account Number:  [id number]  Date Initiated:  07/22/2014  Documentation initiated by:  Elissa Hefty  Subjective/Objective Assessment:   adm w at fib     Action/Plan:   lives w wife, pcp dr Cathlean Cower   Anticipated DC Date:  07/26/2014   Anticipated DC Plan:  [place] referral  Clinical Social Worker      DC Planning Services  CM consult      Choice offered to / List presented to:             Status of service:  Completed, signed off Medicare Important Message given?  YES (If response is "NO", the following Medicare IM given date fields will be blank) Date Medicare IM given:  07/23/2014 Medicare IM given by:  Elissa Hefty Date Additional Medicare IM given:  07/24/2014 Additional Medicare IM given by:  Almyra Free Emilygrace Grothe  Discharge Disposition:  [place]  Per UR Regulation:  Reviewed for med. necessity/level of care/duration of stay  If discussed at [place] of Stay Meetings, dates discussed:   07/25/2014    Comments:  07/26/14 Ellan Lambert, RN, BSN [phone] Pt discharged to SNF today, per CSW arrangements.

## 2014-07-23 DIAGNOSIS — Z8669 Personal history of other diseases of the nervous system and sense organs: Secondary | ICD-10-CM

## 2014-07-23 DIAGNOSIS — R5381 Other malaise: Secondary | ICD-10-CM

## 2014-07-23 LAB — CBC
HCT: 29.5 % — ABNORMAL LOW (ref 39.0–52.0)
HEMOGLOBIN: 9.9 g/dL — AB (ref 13.0–17.0)
MCH: 30 pg (ref 26.0–34.0)
MCHC: 33.6 g/dL (ref 30.0–36.0)
MCV: 89.4 fL (ref 78.0–100.0)
Platelets: 238 10*3/uL (ref 150–400)
RBC: 3.3 MIL/uL — ABNORMAL LOW (ref 4.22–5.81)
RDW: 14.5 % (ref 11.5–15.5)
WBC: 7.4 10*3/uL (ref 4.0–10.5)

## 2014-07-23 LAB — BASIC METABOLIC PANEL
Anion gap: 12 (ref 5–15)
BUN: 23 mg/dL (ref 6–23)
CO2: 26 meq/L (ref 19–32)
CREATININE: 0.94 mg/dL (ref 0.50–1.35)
Calcium: 8.7 mg/dL (ref 8.4–10.5)
Chloride: 92 mEq/L — ABNORMAL LOW (ref 96–112)
GFR calc Af Amer: 87 mL/min — ABNORMAL LOW (ref >90)
GFR calc non Af Amer: 75 mL/min — ABNORMAL LOW (ref >90)
Glucose, Bld: 102 mg/dL — ABNORMAL HIGH (ref 70–99)
Potassium: 4.5 mEq/L (ref 3.7–5.3)
Sodium: 130 mEq/L — ABNORMAL LOW (ref 137–147)

## 2014-07-23 MED ORDER — DIGOXIN 125 MCG PO TABS: 0.1250 mg | ORAL_TABLET | Freq: Every day | ORAL | Status: AC

## 2014-07-23 MED ORDER — DILTIAZEM HCL ER COATED BEADS 180 MG PO CP24: 180.0000 mg | ORAL_CAPSULE | Freq: Every day | ORAL | Status: AC

## 2014-07-23 NOTE — Progress Notes (Addendum)
[facility] PROGRESS NOTE  [name] TIR:[id number] DOB: 03-05-1927 DOA: 07/20/2014 PCP: Cathlean Cower, MD  Off Service Summary [phone] with hx of afib who presents to ED with afib rvr. Pt was initially continued on cardizem gtt, which was later weaned to off and pt cont on home cardizem at 120mg  daily. HR remained labile and dig level found to be subtherapeutic and dose increased to 0.125mg  daily and cardizem increased to 180mg  daily. PT has recommended SNF placement and SW consult is under way.  Of note, pt has a hx of bladder cancer and has gross hematuria. Pt is on anticoagulation for afib, however family has requested pt to continue xarelto given prior hx of CVA. They are aware of risk of continued bleeding. Pt is also on empiric rocephin for ? UTI.  Assessment/Plan: 1. Afib with rvr  1. Cardizem gtt was started in ED and eventually weaned to off 2. On chronic anticoagulation per below 3. Dig level sub therapeutic, increased dose of dig to 0.125mg  4. Increased cardizem to 180mg  5. HR now better controlled 6. Will transfer out of stepdown to med-tele 2. Hematuria  1. Likely related to hx of bladder cancer with concurrent anticoagulation 2. Family cont to request xarelto to be continued despite gross hematuria. See admit h and p. 3. Hgb stable 3. Chronic anemia  1. Hgb stable around 10 2. Monitor closely 3. Cont on xarelto per family request (see above) 4. Copd  1. Stable 5. Hx cva  1. Stable 6. Suspected uti  1. Rocephin started in ED 2. Follow cultures, thus far, mult organisms with predominance 7. dvt prophylaxis  1. On xarelto 8. Suspected Conjunctivitis  1. Pt with B red eyes and "eyes glued shut in the morning" per wife 2. Started empiric abx eye drops 9. Ear wax 1. Pt with marked cerumen in B ears R>L 2. Recommend outpatient ENT referral on discharge  Code Status: Full Family Communication: Pt and family in room  Disposition Plan: Pending sw  consult  Consultants:    Procedures:    Antibiotics:  Rocephin 7/25>>> (indicate start date, and stop date if known)  HPI/Subjective: No complaints. Feels well  Objective: Filed Vitals:   07/23/14 0600 07/23/14 0800 07/23/14 1000 07/23/14 1127  BP: 113/41 100/52 124/70 133/81  Pulse:      Temp:  98.3 F (36.8 C)  97.4 F (36.3 C)  TempSrc:  Oral  Oral  Resp: 16 18 23 16   Height:      Weight:      SpO2: 95% 96% 97% 100%    Intake/Output Summary (Last 24 hours) at 07/23/14 1221 Last data filed at 07/23/14 0900  Gross per 24 hour  Intake  287.5 ml  Output    825 ml  Net -537.5 ml   Filed Weights   07/20/14 1056 07/20/14 1720  Weight: 63.504 kg (140 lb) 53.2 kg (117 lb 4.6 oz)    Exam:   General:  Awake, in nad  Cardiovascular: Irregularly irregular, s1, s2  Respiratory: normal resp effort, no wheezing  Abdomen: soft,nondistended  Musculoskeletal: perfused, no clubbing   Data Reviewed: Basic Metabolic Panel:  Recent Labs Lab 07/20/14 1132 07/21/14 0022 07/23/14 0216  NA 134* 135* 130*  K 4.2 4.9 4.5  CL 94* 98 92*  CO2 28 28 26   GLUCOSE 99 99 102*  BUN 21 21 23   CREATININE 0.91 0.98 0.94  CALCIUM 9.2 8.8 8.7   Liver Function Tests:  Recent Labs Lab 07/20/14 1132 07/21/14  0022  AST 17 27  ALT 9 13  ALKPHOS 89 81  BILITOT 0.7 0.5  PROT 7.2 6.6  ALBUMIN 3.1* 2.6*    Recent Labs Lab 07/20/14 1132  LIPASE 22   No results found for this basename: AMMONIA,  in the last 168 hours CBC:  Recent Labs Lab 07/20/14 1132 07/21/14 0022 07/22/14 0805 07/23/14 0216  WBC 8.7 7.0 6.5 7.4  NEUTROABS 7.4  --   --   --   HGB 10.8* 9.8* 10.1* 9.9*  HCT 32.1* 29.0* 30.7* 29.5*  MCV 89.7 90.3 90.8 89.4  PLT 217 228 228 238   Cardiac Enzymes:  Recent Labs Lab 07/20/14 1132 07/20/14 1820 07/21/14 0022 07/21/14 0615  TROPONINI <0.30 <0.30 <0.30 <0.30   BNP (last 3 results) No results found for this basename: PROBNP,  in the last  8760 hours CBG: No results found for this basename: GLUCAP,  in the last 168 hours  Recent Results (from the past 240 hour(s))  URINE CULTURE     Status: None   Collection Time    07/20/14 12:37 PM      Result Value Ref Range Status   Specimen Description URINE, CLEAN CATCH   Final   Special Requests NONE   Final   Culture  Setup Time     Final   Value: 07/20/2014 19:21     Performed at [facility]     Final   Value: 45,000 COLONIES/ML     Performed at [facility]   Culture     Final   Value: Multiple bacterial morphotypes present, none predominant. Suggest appropriate recollection if clinically indicated.     Performed at [facility]   Report Status 07/21/2014 FINAL   Final  MRSA PCR SCREENING     Status: Abnormal   Collection Time    07/20/14  5:17 PM      Result Value Ref Range Status   MRSA by PCR POSITIVE (*) NEGATIVE Final   Comment:            The GeneXpert MRSA Assay (FDA     approved for NASAL specimens     only), is one component of a     comprehensive MRSA colonization     surveillance program. It is not     intended to diagnose MRSA     infection nor to guide or     monitor treatment for     MRSA infections.     RESULT CALLED TO, READ BACK BY AND VERIFIED WITH:     Hiram Gash RN AT 1909 07/20/14 BY Karie Chimera     Studies: No results found.  Scheduled Meds: . bacitracin-polymyxin b   Both Eyes 6 times per day  . Chlorhexidine Gluconate Cloth  6 each Topical Q0600  . digoxin  0.125 mg Oral Daily  . diltiazem  180 mg Oral Daily  . mupirocin ointment  1 application Nasal BID  . Rivaroxaban  15 mg Oral Q supper  . sodium chloride  3 mL Intravenous Q12H   Continuous Infusions: . sodium chloride Stopped (07/22/14 2145)    Principal Problem:   Atrial fibrillation with rapid ventricular response Active Problems:   NEOPLASM, MALIGNANT, BLADDER   ANEMIA-NOS   HYPERTENSION   COPD   CVA (cerebral vascular  accident)-Acute right small nonhemorrhagic infarct seen on MRI   UTI (lower urinary tract infection)   Protein-calorie malnutrition, severe  Time spent: 23min [name]  K  [facility] Pager [phone]. If 7PM-7AM, please contact night-coverage at [email or web address], password [facility] 07/23/2014, 12:21 PM  LOS: 3 days

## 2014-07-23 NOTE — Progress Notes (Addendum)
Rehab admissions - I have reviewed pt's case and am following up to rehab MD consult from earlier today. I spoke with inhouse insurance reviewer from [place] and based on pt's current diagnosis of deconditioning/AFib, AARP would approve skilled nursing for further rehab needs and not approve inpatient rehab.  I met with pt, his wife and son to share this update. Based on insurance denial of inpatient rehab, we now recommend SNF be pursued. Pt/family were disappointed with this news but understanding of insurance policies. I updated Debbie with case management and Barnett Applebaum with social work. Pt/family are aware that social worker will now follow up with discharge planning for SNF.  I will now sign off pt's case. Thank you.  Nanetta Batty, PT Rehabilitation Admissions Coordinator [phone]

## 2014-07-23 NOTE — Consult Note (Signed)
Physical Medicine and Rehabilitation Consult Reason for Consult: Deconditioning/atrial fibrillation Referring Physician: [facility]   HPI: [name] is a 80 y.o. right-handed male with history of CVA and received inpatient rehabilitation services 01/09/2014 through 01/31/2014. He was discharged to home ambulating supervision level using a rolling walker. Patient also with history of COPD, atrial fibrillation and hypertension. Presented 07/20/2014 with nonspecific chest pain with heart rate 130 to 160 range as well as altered mental status. Placed on intravenous Cardizem. Cranial CT scan showed no acute changes. Troponin negative. Urine culture 45,000 multiple species maintained on empiric antibiotics. MRSA PCR screening positive with contact precautions. Patient remains on Xarelto as prior to admission for history of atrial fibrillation. Maintained on a regular consistency diet. Physical therapy evaluation completed 07/22/2014 with recommendations of physical medicine rehabilitation consult.  Pt walked with PT and IV pole yesterday Off IV cardizem today Review of Systems  Respiratory: Positive for shortness of breath.   Cardiovascular: Positive for chest pain and palpitations.  Neurological: Positive for weakness.  Psychiatric/Behavioral:       Anxiety   Past Medical History  Diagnosis Date  . NEOPLASM, MALIGNANT, BLADDER 04/10/2009  . HYPERLIPIDEMIA 04/10/2009  . ANEMIA-NOS 04/10/2009  . 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 ANXIETY 04/10/2009  . HYPERTENSION 04/10/2009  . Atrial fibrillation 04/10/2009  . COPD 04/10/2009  . TRANSIENT ISCHEMIC ATTACK, HX OF 04/10/2009  . Bladder cancer     Dr. Lawerance Bach  . Lacunar stroke 04/07/2011  . Pleural effusion 04/07/2011  . 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 Stroke    Past Surgical History  Procedure Laterality Date  . Splenectomy    . Bilateral vats ablation    . Facial cancer      facial skin cancer   Family History  Problem  Relation Age of Onset  . Heart disease Mother   . Arthritis Father    Social History:  reports that he has never smoked. He has never used smokeless tobacco. He reports that he does not drink alcohol or use illicit drugs. Allergies:  Allergies  Allergen Reactions  . Azithromycin Other (See Comments)    Altered mental status  . Diazepam Other (See Comments)    REACTION: agitation  . Ezetimibe-Simvastatin Other (See Comments)    unknown  . Morphine Other (See Comments)    Hallucinations.  . Prednisone Other (See Comments)    Dizziness  . Sulfonamide Derivatives Hives   Medications Prior to Admission  Medication Sig Dispense Refill  . Cholecalciferol (VITAMIN D) 2000 UNITS tablet Take 2,000 Units by mouth daily.       . diclofenac sodium (VOLTAREN) 1 % GEL Apply 2 g topically every morning.      . digoxin (LANOXIN) 0.125 MG tablet Take 0.5 tablets (0.0625 mg total) by mouth daily.  30 tablet  1  . diltiazem (CARDIZEM CD) 120 MG 24 hr capsule Take 1 capsule (120 mg total) by mouth daily.  30 capsule  5  . Rivaroxaban (XARELTO) 15 MG TABS tablet Take 15 mg by mouth daily.        Home: Home Living Family/patient expects to be discharged to:: Inpatient rehab  Functional History: Prior Function Level of Independence: Needs assistance Gait / Transfers Assistance Needed: Able to ambulate ~15 feet with RW and wife assist.  ADL's / Homemaking Assistance Needed: Wife assisting with all ADL's, able to self-feed with L hand.  Functional Status:  Mobility: Bed Mobility Overal bed mobility: Needs Assistance Bed Mobility: Rolling;Sidelying to  Sit Rolling: Min assist Sidelying to sit: Mod assist General bed mobility comments: VC's for sequencing and technique. Pt able to reach with L UE for railing and initiate scooting of hips around. Bed pad used to advance hips towards the EOB.  Transfers Overall transfer level: Needs assistance Equipment used: Rolling walker (2 wheeled) Transfers:  Sit to/from [facility] Sit to Stand: Mod assist Stand pivot transfers: Min assist General transfer comment: Pt was able to power-up to full standing position with assist, especially for balance as pt achieved full standing position. VC's for hand placement on seated surface for safety and anterior weight shift for balance. Min assist for balance and cues for sequencing while SPT to chair.  Ambulation/Gait General Gait Details: Pt willing to ambulate, however gait training deferred due to drop in O2 saturations to 79%. RN notified.     ADL:    Cognition: Cognition Overall Cognitive Status: Within Functional Limits for tasks assessed Orientation Level: Oriented to person;Oriented to place;Oriented to time;Disoriented to situation Cognition Arousal/Alertness: Awake/alert Behavior During Therapy: WFL for tasks assessed/performed Overall Cognitive Status: Within Functional Limits for tasks assessed  Blood pressure 110/68, pulse 104, temperature 98.3 F (36.8 C), temperature source Oral, resp. rate 17, height 5\' 9"  (1.753 m), weight 53.2 kg (117 lb 4.6 oz), SpO2 95.00%. Physical Exam  Constitutional:  80 year old right-handed frail Caucasian male.  HENT:  Head: Normocephalic.  Eyes:  Pupils reactive to light  Neck: Normal range of motion. Neck supple. No thyromegaly present.  Cardiovascular:  Cardiac rate controlled  Respiratory: Effort normal and breath sounds normal. No respiratory distress.  GI: Soft. Bowel sounds are normal. He exhibits no distension.  Neurological:  Patient is arousable easily tracked it. He does make eye contact with examiner. With prompting and cues he was able to provide place and name of hospital as well his age with slow responses.  Skin: Skin is warm and dry.  4/5 in BUE and BLE Sensation intact to LT   Results for orders placed during the hospital encounter of 07/20/14 (from the past 24 hour(s))  CBC     Status: Abnormal   Collection  Time    07/22/14  8:05 AM      Result Value Ref Range   WBC 6.5  4.0 - 10.5 K/uL   RBC 3.38 (*) 4.22 - 5.81 MIL/uL   Hemoglobin 10.1 (*) 13.0 - 17.0 g/dL   HCT 30.7 (*) 39.0 - 52.0 %   MCV 90.8  78.0 - 100.0 fL   MCH 29.9  26.0 - 34.0 pg   MCHC 32.9  30.0 - 36.0 g/dL   RDW 14.4  11.5 - 15.5 %   Platelets 228  150 - 400 K/uL  BASIC METABOLIC PANEL     Status: Abnormal   Collection Time    07/23/14  2:16 AM      Result Value Ref Range   Sodium 130 (*) 137 - 147 mEq/L   Potassium 4.5  3.7 - 5.3 mEq/L   Chloride 92 (*) 96 - 112 mEq/L   CO2 26  19 - 32 mEq/L   Glucose, Bld 102 (*) 70 - 99 mg/dL   BUN 23  6 - 23 mg/dL   Creatinine, Ser 0.94  0.50 - 1.35 mg/dL   Calcium 8.7  8.4 - 10.5 mg/dL   GFR calc non Af Amer 75 (*) >90 mL/min   GFR calc Af Amer 87 (*) >90 mL/min   Anion gap 12  5 -  15  CBC     Status: Abnormal   Collection Time    07/23/14  2:16 AM      Result Value Ref Range   WBC 7.4  4.0 - 10.5 K/uL   RBC 3.30 (*) 4.22 - 5.81 MIL/uL   Hemoglobin 9.9 (*) 13.0 - 17.0 g/dL   HCT 29.5 (*) 39.0 - 52.0 %   MCV 89.4  78.0 - 100.0 fL   MCH 30.0  26.0 - 34.0 pg   MCHC 33.6  30.0 - 36.0 g/dL   RDW 14.5  11.5 - 15.5 %   Platelets 238  150 - 400 K/uL   No results found.  Assessment/Plan: Diagnosis: Deconditioning after Afib RVR 1. Does the need for close, 24 hr/day medical supervision in concert with the patient's rehab needs make it unreasonable for this patient to be served in a less intensive setting? Potentially 2. Co-Morbidities requiring supervision/potential complications: Hx CVA with chronic balance and cognitive deficits 3. Due to bladder management, bowel management, safety, skin/wound care, disease management and medication administration, does the patient require 24 hr/day rehab nursing? Potentially 4. Does the patient require coordinated care of a physician, rehab nurse, PT (1-2 hrs/day, 5 days/week) and OT (1-2 hrs/day, 5 days/week) to address physical and  functional deficits in the context of the above medical diagnosis(es)? Potentially Addressing deficits in the following areas: balance, endurance, locomotion, strength, transferring and toileting 5. Can the patient actively participate in an intensive therapy program of at least 3 hrs of therapy per day at least 5 days per week? Potentially 6. The potential for patient to make measurable gains while on inpatient rehab is fair 7. Anticipated functional outcomes upon discharge from inpatient rehab are supervision  with PT, min assist with OT, n/a with SLP. 8. Estimated rehab length of stay to reach the above functional goals is: 7d 9. Does the patient have adequate social supports to accommodate these discharge functional goals? Yes 10. Anticipated D/C setting: Home 11. Anticipated post D/C treatments: [place] therapy 12. Overall Rehab/Functional Prognosis: fair  RECOMMENDATIONS: This patient's condition is appropriate for continued rehabilitative care in the following setting: If pt remains at [place] level on 7/29 then short CIR stay Patient has agreed to participate in recommended program. Potentially Note that insurance prior authorization may be required for reimbursement for recommended care.  Comment: Anticipate improved mobility over the next 1-2 days, no IV pole to contend with, per wife more alert today    07/23/2014

## 2014-07-23 NOTE — Progress Notes (Signed)
Transfer order given and pt and his wife updated of pending move - no questions or concerns at this time.  Report given to Leonard Bars, RN on 2W -   Pt taken to new room via bed with his belongings, meds and chart -  Wife to follow - no acute issues upon transfer.

## 2014-07-24 ENCOUNTER — Encounter (HOSPITAL_COMMUNITY): Payer: Self-pay | Admitting: Physician Assistant

## 2014-07-24 DIAGNOSIS — I4891 Unspecified atrial fibrillation: Secondary | ICD-10-CM

## 2014-07-24 DIAGNOSIS — R4182 Altered mental status, unspecified: Secondary | ICD-10-CM

## 2014-07-24 LAB — CBC
HEMATOCRIT: 30.1 % — AB (ref 39.0–52.0)
HEMOGLOBIN: 10 g/dL — AB (ref 13.0–17.0)
MCH: 29.4 pg (ref 26.0–34.0)
MCHC: 33.2 g/dL (ref 30.0–36.0)
MCV: 88.5 fL (ref 78.0–100.0)
Platelets: 251 10*3/uL (ref 150–400)
RBC: 3.4 MIL/uL — AB (ref 4.22–5.81)
RDW: 14.3 % (ref 11.5–15.5)
WBC: 7.1 10*3/uL (ref 4.0–10.5)

## 2014-07-24 LAB — BASIC METABOLIC PANEL
Anion gap: 12 (ref 5–15)
BUN: 22 mg/dL (ref 6–23)
CALCIUM: 8.7 mg/dL (ref 8.4–10.5)
CO2: 28 meq/L (ref 19–32)
CREATININE: 0.9 mg/dL (ref 0.50–1.35)
Chloride: 91 mEq/L — ABNORMAL LOW (ref 96–112)
GFR calc Af Amer: 88 mL/min — ABNORMAL LOW (ref >90)
GFR calc non Af Amer: 76 mL/min — ABNORMAL LOW (ref >90)
GLUCOSE: 100 mg/dL — AB (ref 70–99)
Potassium: 4.6 mEq/L (ref 3.7–5.3)
Sodium: 131 mEq/L — ABNORMAL LOW (ref 137–147)

## 2014-07-24 MED ORDER — CEFUROXIME AXETIL 500 MG PO TABS
500.0000 mg | ORAL_TABLET | Freq: Two times a day (BID) | ORAL | Status: DC
  Administered 2014-07-24 – 2014-07-26 (×4): 500 mg via ORAL
  Filled 2014-07-24 (×6): qty 1

## 2014-07-24 NOTE — Progress Notes (Signed)
Clinical Social Work Department CLINICAL SOCIAL WORK PLACEMENT NOTE 07/24/2014  Patient:  SULLY, DYMENT  Account Number:  [id number] [place] date:  07/20/2014  Clinical Social Worker:  Megan Salon  Date/time:  07/24/2014 12:17 PM  Clinical Social Work is seeking post-discharge placement for this patient at the following level of care:   [place]   (*CSW will update this form in Epic as items are completed)   07/24/2014  Patient/family provided with [employer] Department of Clinical Social Work's list of facilities offering this level of care within the geographic area requested by the patient (or if unable, by the patient's family).  07/24/2014  Patient/family informed of their freedom to choose among providers that offer the needed level of care, that participate in Medicare, Medicaid or managed care program needed by the patient, have an available bed and are willing to accept the patient.  07/24/2014  Patient/family informed of [facility]' ownership interest in [facility], as well as of the fact that they are under no obligation to receive care at this facility.  PASARR submitted to EDS on 07/24/2014 PASARR number received on 07/24/2014  FL2 transmitted to all facilities in geographic area requested by pt/family on  07/24/2014 FL2 transmitted to all facilities within larger geographic area on   Patient informed that his/her managed care company has contracts with or will negotiate with  certain facilities, including the following:     Patient/family informed of bed offers received:   Patient chooses bed at  Physician recommends and patient chooses bed at    Patient to be transferred to  on   Patient to be transferred to facility by  Patient and family notified of transfer on  Name of family member notified:    The following physician request were entered in Epic:   Additional Comments:  Jeanette Caprice, MSW, [place]

## 2014-07-24 NOTE — Progress Notes (Signed)
Clinical Social Work Department BRIEF PSYCHOSOCIAL ASSESSMENT 07/24/2014  Patient:  [name], [name]     Account Number:  [id number]     Admit date:  07/20/2014  Clinical Social Worker:  Megan Salon  Date/Time:  07/24/2014 12:14 PM  Referred by:  Physician  Date Referred:  07/24/2014 Referred for  SNF Placement   Other Referral:   Interview type:  Family Other interview type:   CSW spoke to patient's wife by bedside    PSYCHOSOCIAL DATA Living Status:  WIFE Admitted from facility:   Level of care:   Primary support name:  [name] Primary support relationship to patient:  SPOUSE Degree of support available:   Good    CURRENT CONCERNS Current Concerns  Post-Acute Placement   Other Concerns:    SOCIAL WORK ASSESSMENT / PLAN Clinical Social Worker received referral for SNF placement at d/c. Clinical Social Worker met with patient's wife at bedside to offer support and discuss patient needs at discharge.    CSW introduced self and explained reason for visit. CSW explained SNF process to patient's wife. Wife reported she is agreeable for SNF placement and her choices are [facility] or [facility]. Patient's wife states she wants time to look at facilities. CSW encouraged patient's wife to think about additional SNF options pending availability of preferred facility. CSW will complete FL2 for MD's signature and will update patient and family when bed offers are received.    CSW remains available for support and to facilitate patient discharge needs once medically ready.   Assessment/plan status:  Psychosocial Support/Ongoing Assessment of Needs Other assessment/ plan:   Information/referral to community resources:   CSW contact information/SNF list    PATIENT'S/FAMILY'S RESPONSE TO PLAN OF CARE: Patient's wife states her choices are [facility] or [facility].       [name], MSW, [place]

## 2014-07-24 NOTE — Progress Notes (Addendum)
[facility] PROGRESS NOTE  [place] EVO:[id number] DOB: 13-Jun-1927 DOA: 07/20/2014 PCP: Cathlean Cower, MD  Off Service Summary [phone] with hx of afib who presents to ED with afib rvr. Pt was initially continued on cardizem gtt, which was later weaned to off and pt cont on home cardizem at 120mg  daily. HR remained labile and dig level found to be subtherapeutic and dose increased to 0.125mg  daily and cardizem increased to 180mg  daily. PT has recommended SNF placement and SW consult is under way.  Of note, pt has a hx of bladder cancer and has gross hematuria. Pt is on anticoagulation for afib, however family has requested pt to continue xarelto given prior hx of CVA. They are aware of risk of continued bleeding. Pt is also on empiric rocephin for ? UTI.  Assessment/Plan:  1. Afib with rvr  1. Cardizem gtt was started in ED and eventually weaned to off 2. On chronic anticoagulation per below 3. Dig level sub therapeutic, increased dose of dig to 0.125mg  4. Increased cardizem to 180mg  5. HR still not well controlled, we'll request cardiology to evaluate question if he needs amiodarone his blood pressures on the softer side as well, EF allthough stable at 55% on recent echo.    2. Hematuria  1. Likely related to hx of bladder cancer with concurrent anticoagulation 2. Family cont to request xarelto to be continued despite gross hematuria. See admit h and p. 3. Hgb stable    3. Chronic anemia  1. Hgb stable around 10 2. Monitor closely 3. Cont on xarelto per family request (see above)    4. Copd  1. Stable   5. Hx cva with bilateral weakness as CVA was bihemispheric  1. Has chronic weakness outpatient PT OT to be continued at SNF. Frail and weak at baseline, right-sided weakness worse than left. Family insists that he is considerably weaker. Will check another MRI brain. Already on maximal therapy with xaralto.    6. Suspected uti  1. Rocephin started in ED, switch to  Ceftin for 4 more days, sample was poor culture unreliable.   7. Suspected Conjunctivitis  1. Pt with B red eyes and "eyes glued shut in the morning" per wife 2. Started empiric abx eye drops    8.    Ear wax 3. Pt with marked cerumen in B ears R>L 4. Recommend outpatient ENT referral on discharge    Code Status: Full Family Communication: Pt and wife in room Disposition Plan: SNF    Consultants:  Cards    Procedures:  CT Head, MRI Brain    Anti-infectives   Start     Dose/Rate Route Frequency Ordered Stop   07/24/14 1230  cefUROXime (CEFTIN) tablet 500 mg     500 mg Oral 2 times daily with meals 07/24/14 1228 07/28/14 1629   07/20/14 1345  cefTRIAXone (ROCEPHIN) 1 g in dextrose 5 % 50 mL IVPB     1 g 100 mL/hr over 30 Minutes Intravenous  Once 07/20/14 1342 07/20/14 1520       DVT prophylaxis  - On xarelto   HPI/Subjective:  In bed, denies any headache, no chest or abdominal pain no shortness of breath. Chronic bilateral weakness from 2 previous strokes  Objective: Filed Vitals:   07/23/14 1701 07/23/14 1856 07/23/14 2150 07/24/14 0649  BP: 112/49 104/64 123/69 101/57  Pulse:   111 115  Temp: 98.4 F (36.9 C) 98.5 F (36.9 C) 97.7 F (36.5 C) 97.5 F (36.4 C)  TempSrc: Oral  Oral Oral  Resp: 16 18 18 18   Height:      Weight:      SpO2: 99% 98% 96% 100%    Intake/Output Summary (Last 24 hours) at 07/24/14 1228 Last data filed at 07/24/14 0830  Gross per 24 hour  Intake    123 ml  Output   1225 ml  Net  -1102 ml   Filed Weights   07/20/14 1056 07/20/14 1720  Weight: 63.504 kg (140 lb) 53.2 kg (117 lb 4.6 oz)    Exam:   General:  Awake, in nad, appears frail at baseline  Cardiovascular: Irregularly irregular, s1, s2  Respiratory: normal resp effort, no wheezing  Abdomen: soft,nondistended  Musculoskeletal: perfused, no clubbing , chronic right more than left sided weakness secondary to stroke which was bihemispheric in the  past  Data Reviewed: Basic Metabolic Panel:  Recent Labs Lab 07/20/14 1132 07/21/14 0022 07/23/14 0216 07/24/14 0430  NA 134* 135* 130* 131*  K 4.2 4.9 4.5 4.6  CL 94* 98 92* 91*  CO2 28 28 26 28   GLUCOSE 99 99 102* 100*  BUN 21 21 23 22   CREATININE 0.91 0.98 0.94 0.90  CALCIUM 9.2 8.8 8.7 8.7   Liver Function Tests:  Recent Labs Lab 07/20/14 1132 07/21/14 0022  AST 17 27  ALT 9 13  ALKPHOS 89 81  BILITOT 0.7 0.5  PROT 7.2 6.6  ALBUMIN 3.1* 2.6*    Recent Labs Lab 07/20/14 1132  LIPASE 22   No results found for this basename: AMMONIA,  in the last 168 hours CBC:  Recent Labs Lab 07/20/14 1132 07/21/14 0022 07/22/14 0805 07/23/14 0216 07/24/14 0430  WBC 8.7 7.0 6.5 7.4 7.1  NEUTROABS 7.4  --   --   --   --   HGB 10.8* 9.8* 10.1* 9.9* 10.0*  HCT 32.1* 29.0* 30.7* 29.5* 30.1*  MCV 89.7 90.3 90.8 89.4 88.5  PLT 217 228 228 238 251   Cardiac Enzymes:  Recent Labs Lab 07/20/14 1132 07/20/14 1820 07/21/14 0022 07/21/14 0615  TROPONINI <0.30 <0.30 <0.30 <0.30   BNP (last 3 results) No results found for this basename: PROBNP,  in the last 8760 hours CBG: No results found for this basename: GLUCAP,  in the last 168 hours  Recent Results (from the past 240 hour(s))  URINE CULTURE     Status: None   Collection Time    07/20/14 12:37 PM      Result Value Ref Range Status   Specimen Description URINE, CLEAN CATCH   Final   Special Requests NONE   Final   Culture  Setup Time     Final   Value: 07/20/2014 19:21     Performed at [facility]     Final   Value: 45,000 COLONIES/ML     Performed at [facility]   Culture     Final   Value: Multiple bacterial morphotypes present, none predominant. Suggest appropriate recollection if clinically indicated.     Performed at [facility]   Report Status 07/21/2014 FINAL   Final  MRSA PCR SCREENING     Status: Abnormal   Collection Time    07/20/14  5:17 PM       Result Value Ref Range Status   MRSA by PCR POSITIVE (*) NEGATIVE Final   Comment:            The GeneXpert MRSA Assay (FDA  approved for NASAL specimens     only), is one component of a     comprehensive MRSA colonization     surveillance program. It is not     intended to diagnose MRSA     infection nor to guide or     monitor treatment for     MRSA infections.     RESULT CALLED TO, READ BACK BY AND VERIFIED WITH:     Hiram Gash RN AT 1909 07/20/14 BY Karie Chimera     Studies: No results found.  Scheduled Meds: . bacitracin-polymyxin b   Both Eyes 6 times per day  . cefUROXime  500 mg Oral BID WC  . Chlorhexidine Gluconate Cloth  6 each Topical Q0600  . digoxin  0.125 mg Oral Daily  . diltiazem  180 mg Oral Daily  . mupirocin ointment  1 application Nasal BID  . Rivaroxaban  15 mg Oral Q supper  . sodium chloride  3 mL Intravenous Q12H   Continuous Infusions: . sodium chloride Stopped (07/22/14 2145)     Time spent: 59min  SINGH,PRASHANT K  [facility] Pager [phone]. If 7PM-7AM, please contact night-coverage at [email or web address], password [facility] 07/24/2014, 12:28 PM  LOS: 4 days

## 2014-07-24 NOTE — Progress Notes (Signed)
Physical Therapy Treatment Patient Details Name: [name] MRN: [id number] DOB: 1927-11-24 Today's Date: 07/24/2014    History of Present Illness Pt is an 80 y/o male admitted with A-fib with rapid ventricular response. Pt coming from home where wife was assisting with most mobility and ADL's.     PT Comments    Pt progressing towards goals, ambulated 15' today with RW and mod A. Was able to have bowel mvmt in bathroom. Worked on standing balance and strengthening exercises. Pt c/o feeling very stiff which improved with mobility. PT will continue to follow.   Follow Up Recommendations  SNF;Supervision/Assistance - 24 hour     Equipment Recommendations  None recommended by PT    Recommendations for Other Services       Precautions / Restrictions Precautions Precautions: Fall Precaution Comments: Residual weakness on R>L side from previous stroke. Restrictions Weight Bearing Restrictions: No    Mobility  Bed Mobility Overal bed mobility: Needs Assistance Bed Mobility: Rolling;Sidelying to Sit Rolling: Mod assist Sidelying to sit: Mod assist       General bed mobility comments: pt very stiff with initial mobility and painful with knee flexion. Mod A to roll to left and push away from bed to rise to sitting. Pt able to do more on his own the more he moved.  Transfers Overall transfer level: Needs assistance Equipment used: Rolling walker (2 wheeled) Transfers: Sit to/from Stand Sit to Stand: Mod assist         General transfer comment: mod A from bed and chair, power up for sit to stand as well as fwd wt-shift and helping control motion on sit as pt very fearful of falling and has knee pain with flexion into sitting.  Ambulation/Gait Ambulation/Gait assistance: Mod assist Ambulation Distance (Feet): 15 Feet Assistive device: Rolling walker (2 wheeled) Gait Pattern/deviations: Step-to pattern;Shuffle;Ataxic Gait velocity: decreased   General Gait Details: vc's  for step length and picking up feet, especially had difficulty advancing LLE. vc's to step up into RW instead of moving it fwd without feet following.   Stairs            Wheelchair Mobility    Modified Rankin (Stroke Patients Only)       Balance Overall balance assessment: Needs assistance Sitting-balance support: Feet supported;No upper extremity supported Sitting balance-Leahy Scale: Fair     Standing balance support: No upper extremity supported;During functional activity Standing balance-Leahy Scale: Poor Standing balance comment: pt requires UE support or min A without support for safety               High Level Balance Comments: worked on single leg stance with pt holding RW as well as standing without UE support with arms fwd and then out to side, min A needed for safety    Cognition Arousal/Alertness: Awake/alert Behavior During Therapy: WFL for tasks assessed/performed Overall Cognitive Status: Within Functional Limits for tasks assessed                      Exercises General Exercises - Lower Extremity Ankle Circles/Pumps: AROM;Both;20 reps;Seated Long Arc Quad: AROM;Both;10 reps;Seated Heel Slides: AROM;Both;10 reps;Seated;Supine Straight Leg Raises: AROM;Both;10 reps;Supine Hip Flexion/Marching: AROM;Both;10 reps;Standing    General Comments        Pertinent Vitals/Pain VSS    Home Living                      Prior Function  PT Goals (current goals can now be found in the care plan section) Acute Rehab PT Goals Patient Stated Goal: pt and wife wanted to go to CIR but cannot due so because of insurance, will go to SNF first PT Goal Formulation: With patient/family Time For Goal Achievement: 08/05/14 Potential to Achieve Goals: Good Progress towards PT goals: Progressing toward goals    Frequency  Min 3X/week    PT Plan Discharge plan needs to be updated    Co-evaluation             End of  Session Equipment Utilized During Treatment: Gait belt Activity Tolerance: Patient tolerated treatment well Patient left: in chair;with family/visitor present;with call bell/phone within reach     Time: 0928-0952 PT Time Calculation (min): 24 min  Charges:  $Gait Training: 8-22 mins $Therapeutic Exercise: 8-22 mins                    G Codes:     [name], PT  Acute Rehab Services  [phone]  [name] 07/24/2014, 11:31 AM

## 2014-07-24 NOTE — Consult Note (Signed)
Cardiology Consultation Note  Patient ID: [name], MRN: [id number], DOB/AGE: Jul 12, 1927 80 y.o. Admit date: 07/20/2014   Date of Consult: 07/24/2014 Primary Physician: Cathlean Cower, MD Primary Cardiologist: Mare Ferrari  Chief Complaint: confusion, chest discomfort Reason for Consult: AF  HPI: [name] is an 80 y/o M with history of chronic AF and historically difficult to control rates (117-124 as OP), multiple CVAs over the years (including last in 11/2013 then 2 weeks later in 12/2013), ITP s/p splenectomy, CKD stage II, bladder CA dx 7-10 y/a with intermittent heavy hematuria, anemia BL-Hgb 10-11.  It does not appear he has had prior ischemic diagnosis or evaluation. 2D Echo 05/2013 showed EF 55-60%, no RWMA, trivial AI, mild MR, mod dilated LA/RA, PA pressure 64mHg. His wife called EMS on Saturday 7/25 due to altered mental status. He was extremely lethargic despite multiple efforts to get him to wake up including washing his face with cold water. He had shallow respirations. When EMS arrived he had unintelligible speech. He did report chest/back discomfort when sitting up with EMS but had not had this before or since that event. He was brought to the ER where HR in the 130-160 range, started on IV fluids. UA showed large blood and leukocytes. He was placed on rocephin and Cardizem drip. He is also being treated for conjunctivitis. CT head showed remote infarcts but no acute abnormality. He continues to have intermittent hematuria -  family had requested xarelto to be continued due to h/o multiple strokes despite gross hematuria. Initially his rate improved and he was able to be changed to oral diltiazem per notes but this has had to be titrated along with titration of digoxin (dig level <0.3 so digoxin increased to 0.125mg ). CIR is evaluating the patient for another course of rehab due to deconditioning. Review of labs indicates relatively stable anemia, negative troponins, neg lipase.  SBPs in-house  lowest 94/55 and most recent highest was 123/69 last night.  His wife is distraught at the idea that he may have to go to SNF.  At home, he is typically able to get around at least somewhat with a walker and is alert and oriented at baseline. This has not been the case during admission. He remains unable to answer some certain questions including orientation to place and time, and unable to lift his legs when asked to do so.  They follow with Dr[facility] Rosana [facility] at [facility]. He was recently evaluated last week at which time he was reporting some dysuria. Wife states that for now, his tumors are confined and haven't spread elsewhere.   Past Medical History  Diagnosis Date  . Bladder cancer     a. Dr. Lawerance Bach - [place] [facility].  . Hyperlipidemia   . Anemia 04/10/2009  . Immune thrombocytopenic purpura     a. s/p splenectomy.  Marland Kitchen Anxiety   . Hypertension   . Permanent atrial fibrillation     a. Historically difficult to control rates  . COPD   . TIA (transient ischemic attack)     a. H/o TIA 2007.  . Stroke     a. H/o remote lacunar infarcts, at least 3 of them. b. Right lenticular nucleus and corona radiata infarct, cardioembolic 04/6811. c. Stroke 11/2013, new infarct 2 weeks later 12/2013.  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 Gross hematuria     a. H/o bladder CA with intermittent gross hematuria.  Most Recent Cardiac Studies: 2D Echo 05/2013 - Left ventricle: The cavity size was normal. Wall thickness was increased in a pattern of mild LVH. Systolic function was normal. The estimated ejection fraction was in the range of 55% to 60%. Wall motion was normal; there were no regional wall motion abnormalities. The study is not technically sufficient to allow evaluation of LV diastolic function. - Aortic valve: Trivial regurgitation. - Mitral valve: Calcified annulus. Mild regurgitation. - Left atrium: The atrium was  moderately dilated. - Right atrium: The atrium was moderately dilated. - Pulmonary arteries: PA peak pressure: 61mm Hg (S).   Surgical History:  Past Surgical History  Procedure Laterality Date  . Splenectomy    . Bilateral vats ablation    . Facial cancer      facial skin cancer     Home Meds: Prior to Admission medications   Medication Sig Start Date End Date Taking? Authorizing Provider  Cholecalciferol (VITAMIN D) 2000 UNITS tablet Take 2,000 Units by mouth daily.    Yes Historical Provider, MD  diclofenac sodium (VOLTAREN) 1 % GEL Apply 2 g topically every morning.   Yes Historical Provider, MD  digoxin (LANOXIN) 0.125 MG tablet Take 0.5 tablets (0.0625 mg total) by mouth daily. 01/30/14  Yes Ivan Anchors Love, PA-C  diltiazem (CARDIZEM CD) 120 MG 24 hr capsule Take 1 capsule (120 mg total) by mouth daily. 03/01/14  Yes Darlin Coco, MD  Rivaroxaban (XARELTO) 15 MG TABS tablet Take 15 mg by mouth daily.   Yes Historical Provider, MD  digoxin (LANOXIN) 0.125 MG tablet Take 1 tablet (0.125 mg total) by mouth daily. 07/23/14   Donne Hazel, MD  diltiazem (CARDIZEM CD) 180 MG 24 hr capsule Take 1 capsule (180 mg total) by mouth daily. 07/23/14   Donne Hazel, MD    Inpatient Medications:  . bacitracin-polymyxin b   Both Eyes 6 times per day  . cefUROXime  500 mg Oral BID WC  . Chlorhexidine Gluconate Cloth  6 each Topical Q0600  . digoxin  0.125 mg Oral Daily  . diltiazem  180 mg Oral Daily  . mupirocin ointment  1 application Nasal BID  . Rivaroxaban  15 mg Oral Q supper  . sodium chloride  3 mL Intravenous Q12H   . sodium chloride Stopped (07/22/14 2145)    Allergies:  Allergies  Allergen Reactions  . Azithromycin Other (See Comments)    Altered mental status  . Diazepam Other (See Comments)    REACTION: agitation  . Ezetimibe-Simvastatin Other (See Comments)    unknown  . Morphine Other (See Comments)    Hallucinations.  . Prednisone Other (See Comments)     Dizziness  . Sulfonamide Derivatives Hives    History   Social History  . Marital Status: Married    Spouse Name: N/A    Number of Children: N/A  . Years of Education: N/A   Occupational History  . 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 Smoking status: Never Smoker   . Smokeless tobacco: Never Used  . Alcohol Use: No  . Drug Use: No  . Sexual Activity: Not on file   Other Topics Concern  . Not on file   Social History Narrative   Wife is Travonta Gill - pt. Of [place]     Family History  Problem Relation Age of Onset  . Heart disease Mother   . Arthritis Father  Review of Systems: obtained only through talking to wife as patient is poor historian General: negative for chills, fever Cardiovascular: see above. No orthopnea, PND Respiratory: negative for cough Urologic: positive hematuria Abdominal: negative for nausea, vomiting   Labs:  Lab Results  Component Value Date   WBC 7.1 07/24/2014   HGB 10.0* 07/24/2014   HCT 30.1* 07/24/2014   MCV 88.5 07/24/2014   PLT 251 07/24/2014    Recent Labs Lab 07/21/14 0022  07/24/14 0430  NA 135*  < > 131*  K 4.9  < > 4.6  CL 98  < > 91*  CO2 28  < > 28  BUN 21  < > 22  CREATININE 0.98  < > 0.90  CALCIUM 8.8  < > 8.7  PROT 6.6  --   --   BILITOT 0.5  --   --   ALKPHOS 81  --   --   ALT 13  --   --   AST 27  --   --   GLUCOSE 99  < > 100*  < > = values in this interval not displayed. Lab Results  Component Value Date   CHOL 107 12/29/2013   HDL 49 12/29/2013   LDLCALC 47 12/29/2013   TRIG 53 12/29/2013   Troponins negative  Radiology/Studies:  Ct Head Wo Contrast  07/20/2014   CLINICAL DATA:  Generalized weakness and mental status change.  EXAM: CT HEAD WITHOUT CONTRAST  TECHNIQUE: Contiguous axial images were obtained from the base of the skull through the vertex without intravenous contrast.  COMPARISON:  01/05/2014.  FINDINGS: Stable age related  cerebral atrophy, ventriculomegaly, periventricular white matter disease and remote lacunar-type basal ganglia infarcts. Evolutionary change in the left PCA infarct noted on the prior MRI with encephalomalacia. No definite acute infarct and no intracranial hemorrhage, mass lesion or extra-axial fluid collection.  The bony structures are intact. The paranasal sinuses and mastoid air cells are grossly clear. The globes are intact.  IMPRESSION: 1. Stable age related cerebral atrophy, ventriculomegaly and periventricular white matter disease. 2. Remote lacunar-type basal ganglia infarcts an remote left PCA infarct. 3. No acute intracranial findings, mass lesion or skull fracture.   Electronically Signed   By: Kalman Jewels M.D.   On: 07/20/2014 13:23   Dg Chest Portable 1 View  07/20/2014   CLINICAL DATA:  Chest pain  EXAM: PORTABLE CHEST - 1 VIEW  COMPARISON:  January 06, 2014  FINDINGS: There is underlying emphysematous change. There are areas of mild scarring bilaterally. There is no frank edema or consolidation. Heart is upper normal in size with pulmonary vascularity reflecting the underlying emphysematous change. There is stable apical pleural thickening bilaterally, slightly more on the left than on the right. No adenopathy. There is atherosclerotic change in the aorta.  IMPRESSION: Underlying emphysema with areas of lung scarring. No edema or consolidation.   Electronically Signed   By: Lowella Grip M.D.   On: 07/20/2014 12:40   EKG: 07/20/14: atrial fib 120bpm, right axis deviation, nonspecific ST-T changes, no acute change from prior  Physical Exam: Blood pressure 101/57, pulse 115, temperature 97.5 F (36.4 C), temperature source Oral, resp. rate 18, height 5\' 9"  (1.753 m), weight 117 lb 4.6 oz (53.2 kg), SpO2 100.00%. General: Well developed cachectic elderly WM no acute distress. Sleeping in chair but arousable Head: Normocephalic, atraumatic, sclera non-icteric, no xanthomas, nares are  without discharge.  Neck: Negative for carotid bruits. JVD not elevated. Lungs: Clear bilaterally to auscultation  without wheezes, rales, or rhonchi. Breathing is unlabored. Heart: Irregular, borderline tachycardic with S1 S2. No murmurs, rubs, or gallops appreciated. Abdomen: Soft, non-tender, non-distended with normoactive bowel sounds. No hepatomegaly. No rebound/guarding. No obvious abdominal masses. Msk:  Strength and tone appear normal for age. Extremities: No clubbing or cyanosis. No edema.  Distal pedal pulses are 2+ and equal bilaterally. Neuro: Sleepy but arousable and follows simple commands. Alert and oriented to self only. Blank stare when asked the date multiple times and does not answer. Recited home address after being asked where he is. Grip is 4/5 equal bilaterally but almost pulsatile tendency when squeezing hands. Wiggles his toes but does not lift legs on command. General facial symmetry.   Assessment and Plan:   1. Altered mental status 2. H/o multiple embolic strokes 3. Permanent atrial fibrillation with historically difficult to control rates, currently ~110 4. Bladder cancer with intermittent gross hematuria 5. Possible UTI 6. Weight loss and possible failure to thrive  At this time, would recommend neuro consultation for his altered mental status, disorientation, & inability to follow certain simple commands. CT head negative but may need MRI to exclude new CVA. The state of his atrial fibrillation is currently where it is near baseline. In the setting of his mental status, would not titrate diltiazem further so as not to decrease BP. Continue increased dose of digoxin. Suspect the isolated episode of chest discomfort he had while sitting up with EMS on day of admission was musculoskeletal - unless this recurs, would not pursue further eval at that this time in light of frailty and need for chronic anticoag for AF. He has ruled out. We do suspect some component of  progressive decline given weight loss and frail appearance, and may require further monitoring for progression of malignancy.  Signed, Melina Copa PA-C 07/24/2014, 1:33 PM    I have seen and examined the patient along with Melina Copa, PA.  I have reviewed the chart, notes and new data.  I agree with PA's note.  [name] clearly has progressive chronic illnesses and gradual deterioration in functional status, but also an acute neurological decline.  He has permanent AF with chronically difficult ventricular rate control (AV node blocking agents limited by low BP). His ventricular rate is back to his usual baseline and he continues to have severe disorientation, hesitant speech and worsened strength and mobility. I don't think his relatively brief excessive tachycardia caused this. His neuro exam is challenging to interpret since he has had bi-hemispheric old strokes with residual weakness. CT on arrival was unremarkable for new problems, but could easily have missed an acute or posterior circulation stroke.  Suggest neurology evaluation, brain MRI. At this point there are no plans for additional titration of his cardiac meds.  Sanda Klein, MD, [place] [phone] 07/24/2014, 3:51 PM

## 2014-07-24 NOTE — Progress Notes (Signed)
CSW provided bed offers to patient's wife and patient's son by bedside. Patient's wife states they will look at their choices and let social worker know.  Jeanette Caprice, MSW, [place]

## 2014-07-25 ENCOUNTER — Inpatient Hospital Stay (HOSPITAL_COMMUNITY): Payer: Medicare Other

## 2014-07-25 DIAGNOSIS — D649 Anemia, unspecified: Secondary | ICD-10-CM

## 2014-07-25 DIAGNOSIS — R404 Transient alteration of awareness: Secondary | ICD-10-CM

## 2014-07-25 MED ORDER — AMIODARONE HCL 200 MG PO TABS
400.0000 mg | ORAL_TABLET | Freq: Two times a day (BID) | ORAL | Status: DC
  Administered 2014-07-25: 400 mg via ORAL
  Filled 2014-07-25 (×4): qty 2

## 2014-07-25 NOTE — Progress Notes (Signed)
[facility] PROGRESS NOTE  [name] JFH:[id number] DOB: 10/12/1927 DOA: 07/20/2014 PCP: Cathlean Cower, MD  Off Service Summary [phone] with hx of afib who presents to ED with afib rvr. Pt was initially continued on cardizem gtt, which was later weaned to off and pt cont on home cardizem at 120mg  daily. HR remained labile and dig level found to be subtherapeutic and dose increased to 0.125mg  daily and cardizem increased to 180mg  daily. PT has recommended SNF placement and SW consult is under way.  Of note, pt has a hx of bladder cancer and has gross hematuria. Pt is on anticoagulation for afib, however family has requested pt to continue xarelto given prior hx of CVA. They are aware of risk of continued bleeding. Pt is also on empiric rocephin for ? UTI.  Assessment/Plan:   1. Afib with rvr  1. Cardizem gtt was started in ED and eventually weaned to off 2. On chronic anticoagulation per below 3. Dig level sub therapeutic, increased dose of dig to 0.125mg  4. Increased cardizem to 180mg  5. HR still not well controlled, only consulted and they added amiodarone for better rate control , EF allthough stable at 55% on recent echo.     2. Hematuria  1. Likely related to hx of bladder cancer with concurrent anticoagulation 2. Family cont to request xarelto to be continued despite gross hematuria. See admit h and p. 3. Hgb stable      3. Chronic anemia  1. Hgb stable around 10 2. Monitor closely 3. Cont on xarelto per family request (see above)     4. Copd - Stable     5. Hx cva with bilateral weakness as CVA was bihemispheric acute decompensation due to UTI.  1. Has chronic weakness outpatient PT OT to be continued at SNF. Frail and weak at baseline, right-sided weakness worse than left. MRI of the brain was obtained upon family requested S. date the heart weakness is continuing far too long. MRI done shows changes consistent with old left occipital infarct with some retained  blood products versus small extension of the old infarct. He is already on maximal treatment with xaralto and statin, neuro consulted. They agree with continuing present medical treatment. PT  already following.     6. Suspected uti  1. Rocephin started in ED, switch to Ceftin for 4 more days, sample was poor culture unreliable.    7. Suspected Conjunctivitis  1. Pt with B red eyes and "eyes glued shut in the morning" per wife 2. Started empiric abx eye drops     8.    Ear wax 3. Pt with marked cerumen in B ears R>L 4. Recommend outpatient ENT referral on discharge    Code Status: Full Family Communication: Pt and wife in room Disposition Plan: SNF    Consultants:  Cards    Procedures:  CT Head, MRI Brain    Anti-infectives   Start     Dose/Rate Route Frequency Ordered Stop   07/24/14 1300  cefUROXime (CEFTIN) tablet 500 mg     500 mg Oral 2 times daily with meals 07/24/14 1228 07/28/14 1629   07/20/14 1345  cefTRIAXone (ROCEPHIN) 1 g in dextrose 5 % 50 mL IVPB     1 g 100 mL/hr over 30 Minutes Intravenous  Once 07/20/14 1342 07/20/14 1520       DVT prophylaxis  - On xarelto   HPI/Subjective:  In bed, denies any headache, no chest or abdominal pain no shortness of breath.  Chronic bilateral weakness from 2 previous strokes  Objective: Filed Vitals:   07/24/14 0649 07/24/14 1430 07/24/14 1949 07/25/14 0505  BP: 101/57 102/70 111/65 106/71  Pulse: 115 109 110 118  Temp: 97.5 F (36.4 C) 98.3 F (36.8 C) 97.9 F (36.6 C) 97.9 F (36.6 C)  TempSrc: Oral Oral Oral Oral  Resp: 18 18 18 19   Height:      Weight:      SpO2: 100% 94% 100% 98%    Intake/Output Summary (Last 24 hours) at 07/25/14 1320 Last data filed at 07/25/14 0600  Gross per 24 hour  Intake    240 ml  Output   1150 ml  Net   -910 ml   Filed Weights   07/20/14 1056 07/20/14 1720  Weight: 63.504 kg (140 lb) 53.2 kg (117 lb 4.6 oz)    Exam:   General:  Awake, in nad,  appears frail at baseline  Cardiovascular: Irregularly irregular, s1, s2  Respiratory: normal resp effort, no wheezing  Abdomen: soft,nondistended  Musculoskeletal: perfused, no clubbing , chronic right more than left sided weakness secondary to stroke which was bihemispheric in the past  Data Reviewed: Basic Metabolic Panel:  Recent Labs Lab 07/20/14 1132 07/21/14 0022 07/23/14 0216 07/24/14 0430  NA 134* 135* 130* 131*  K 4.2 4.9 4.5 4.6  CL 94* 98 92* 91*  CO2 28 28 26 28   GLUCOSE 99 99 102* 100*  BUN 21 21 23 22   CREATININE 0.91 0.98 0.94 0.90  CALCIUM 9.2 8.8 8.7 8.7   Liver Function Tests:  Recent Labs Lab 07/20/14 1132 07/21/14 0022  AST 17 27  ALT 9 13  ALKPHOS 89 81  BILITOT 0.7 0.5  PROT 7.2 6.6  ALBUMIN 3.1* 2.6*    Recent Labs Lab 07/20/14 1132  LIPASE 22   No results found for this basename: AMMONIA,  in the last 168 hours CBC:  Recent Labs Lab 07/20/14 1132 07/21/14 0022 07/22/14 0805 07/23/14 0216 07/24/14 0430  WBC 8.7 7.0 6.5 7.4 7.1  NEUTROABS 7.4  --   --   --   --   HGB 10.8* 9.8* 10.1* 9.9* 10.0*  HCT 32.1* 29.0* 30.7* 29.5* 30.1*  MCV 89.7 90.3 90.8 89.4 88.5  PLT 217 228 228 238 251   Cardiac Enzymes:  Recent Labs Lab 07/20/14 1132 07/20/14 1820 07/21/14 0022 07/21/14 0615  TROPONINI <0.30 <0.30 <0.30 <0.30   BNP (last 3 results) No results found for this basename: PROBNP,  in the last 8760 hours CBG: No results found for this basename: GLUCAP,  in the last 168 hours  Recent Results (from the past 240 hour(s))  URINE CULTURE     Status: None   Collection Time    07/20/14 12:37 PM      Result Value Ref Range Status   Specimen Description URINE, CLEAN CATCH   Final   Special Requests NONE   Final   Culture  Setup Time     Final   Value: 07/20/2014 19:21     Performed at [place]     Final   Value: 45,000 COLONIES/ML     Performed at [facility]   Culture     Final    Value: Multiple bacterial morphotypes present, none predominant. Suggest appropriate recollection if clinically indicated.     Performed at [facility]   Report Status 07/21/2014 FINAL   Final  MRSA PCR SCREENING  Status: Abnormal   Collection Time    07/20/14  5:17 PM      Result Value Ref Range Status   MRSA by PCR POSITIVE (*) NEGATIVE Final   Comment:            The GeneXpert MRSA Assay (FDA     approved for NASAL specimens     only), is one component of a     comprehensive MRSA colonization     surveillance program. It is not     intended to diagnose MRSA     infection nor to guide or     monitor treatment for     MRSA infections.     RESULT CALLED TO, READ BACK BY AND VERIFIED WITH:     Hiram Gash RN AT 1909 07/20/14 BY Karie Chimera     Studies: Mr Brain Wo Contrast  07/25/2014   CLINICAL DATA:  Stroke  EXAM: MRI HEAD WITHOUT CONTRAST  TECHNIQUE: Multiplanar, multiecho pulse sequences of the brain and surrounding structures were obtained without intravenous contrast.  COMPARISON:  MRI 01/06/2014  FINDINGS: Generalized atrophy. Multiple areas of chronic infarction including the cerebral white matter bilaterally, and the basal ganglia bilaterally. Chronic infarct left occipital lobe which contains an area of hyperintensity on diffusion-weighted imaging. Favor chronic blood products although small area of acute infarct is possible. Correlate with any acute visual changes. No other areas of acute infarct.  Negative for mass lesion.  No shift of the midline structures.  Paranasal sinuses are clear.  IMPRESSION: Atrophy and moderately severe chronic ischemic change.  Chronic left occipital infarct as noted on the prior study. Small area of diffusion hyperintensity on the posterior aspect of this infarct. Favor chronic blood products versus a small area of acute infarct posterior to the encephalomalacia.   Electronically Signed   By: Franchot Gallo M.D.   On: 07/25/2014 10:09     Scheduled Meds: . amiodarone  400 mg Oral BID  . bacitracin-polymyxin b   Both Eyes 6 times per day  . cefUROXime  500 mg Oral BID WC  . Chlorhexidine Gluconate Cloth  6 each Topical Q0600  . digoxin  0.125 mg Oral Daily  . Rivaroxaban  15 mg Oral Q supper  . sodium chloride  3 mL Intravenous Q12H   Continuous Infusions: . sodium chloride Stopped (07/22/14 2145)     Time spent: 83min  SINGH,PRASHANT K  [facility] Pager [phone]. If 7PM-7AM, please contact night-coverage at [email or web address], password [facility] 07/25/2014, 1:20 PM  LOS: 5 days

## 2014-07-25 NOTE — Progress Notes (Signed)
     SUBJECTIVE: No complaints.   BP 106/71  Pulse 118  Temp(Src) 97.9 F (36.6 C) (Oral)  Resp 19  Ht 5\' 9"  (1.753 m)  Wt 117 lb 4.6 oz (53.2 kg)  BMI 17.31 kg/m2  SpO2 98%  Intake/Output Summary (Last 24 hours) at 07/25/14 1123 Last data filed at 07/25/14 0600  Gross per 24 hour  Intake    360 ml  Output   1150 ml  Net   -790 ml    PHYSICAL EXAM General: Well developed, well nourished, in no acute distress. Alert and oriented x 3.  Psych:  Good affect, responds appropriately Neck: No JVD. No masses noted.  Lungs: Clear bilaterally with no wheezes or rhonci noted.  Heart: RRR with no murmurs noted. Abdomen: Bowel sounds are present. Soft, non-tender.  Extremities: No lower extremity edema.   LABS: Basic Metabolic Panel:  Recent Labs  07/23/14 0216 07/24/14 0430  NA 130* 131*  K 4.5 4.6  CL 92* 91*  CO2 26 28  GLUCOSE 102* 100*  BUN 23 22  CREATININE 0.94 0.90  CALCIUM 8.7 8.7   CBC:  Recent Labs  07/23/14 0216 07/24/14 0430  WBC 7.4 7.1  HGB 9.9* 10.0*  HCT 29.5* 30.1*  MCV 89.4 88.5  PLT 238 251   Current Meds: . bacitracin-polymyxin b   Both Eyes 6 times per day  . cefUROXime  500 mg Oral BID WC  . Chlorhexidine Gluconate Cloth  6 each Topical Q0600  . digoxin  0.125 mg Oral Daily  . diltiazem  180 mg Oral Daily  . Rivaroxaban  15 mg Oral Q supper  . sodium chloride  3 mL Intravenous Q12H   ASSESSMENT AND PLAN:  1. Chronic persistent atrial fibrillation: Rate not well controlled on Cardizem and Digoxin. Review of records shows that his HR has not been well controlled over the past few months. Will stop Cardizem. Will start amiodarone 400 mg po BID. Load with this dose for one week then reduce to 200 mg po BID for 2 weeks then 200 mg daily. Continue Xarelto. Likely recurrent CVA. Not sure changing to Eliquis would offer any additional protection.   [name],[name]  7/30/201511:23 AM

## 2014-07-25 NOTE — Clinical Social Work Note (Signed)
Private duty CNA at [place] contacted wife to discuss SNF bed offers/selection- she has indicated her first choice is [facility]- bed confirmed and anticipate d/c tomorrow- Wife requesting EMS and private room- she is pleased.  CSW will follow up for ? dc tomorrow.    Eduard Clos, MSW, [place]

## 2014-07-25 NOTE — Progress Notes (Signed)
Thank you for re-consult on [name]. Please note formal consult of 07/23/14 indicating that patient would be a good candidate for CIR however his insurance has denied this due to negative radiologic work up so far. We could pursue this further if  stroke documented per MRI or neurology input. Await MRI results.

## 2014-07-25 NOTE — Consult Note (Signed)
Referring Physician: Candiss Norse    Chief Complaint: possible extension of old stroke  HPI:                                                                                                                                         [name] is an 80 y.o. male with known Afib on Xarelto.  He was initially admitted for chest pain. While admitted he was noted to have hematuria but family has requested Xarelto be continued. Wife also noted patient had increased generalized weakness. For this reason MRI was obtained. MRI showed "chronic left occipital infarct as noted on the prior study. Small area of diffusion hyperintensity on the posterior aspect of this infarct. Favor chronic blood products versus a small area of acute infarct posterior to the encephalomalacia."  In talking with patient, his only complaint is generalized weakness and diffuse joint discomfort. He denies lateralizing weakness or change in vision. Neurology was called for further recommendations.    Date last known well: Unable to determine Time last known well: Unable to determine tPA Given: No: on xeralto  Past Medical History  Diagnosis Date  . Bladder cancer     a. Dr. Lawerance Bach - [facility]. Multiple surgeries, chemo trial.  . Hyperlipidemia   . Anemia 04/10/2009  . Immune thrombocytopenic purpura     a. s/p splenectomy.  Marland Kitchen Anxiety   . Hypertension   . Permanent atrial fibrillation     a. Historically difficult to control rates  . COPD   . TIA (transient ischemic attack)     a. H/o TIA 2007.  . Stroke     a. H/o remote lacunar infarcts, at least 3 of them. b. Right lenticular nucleus and corona radiata infarct, cardioembolic 01/9936. c. Stroke 11/2013, new infarct 2 weeks later 12/2013.  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 Gross hematuria     a. H/o bladder CA with intermittent gross hematuria.    Past Surgical History  Procedure  Laterality Date  . Splenectomy    . Bilateral vats ablation    . Facial cancer      facial skin cancer    Family History  Problem Relation Age of Onset  . Heart disease Mother   . Arthritis Father    Social History:  reports that he has never smoked. He has never used smokeless tobacco. He reports that he does not drink alcohol or use illicit drugs.  Allergies:  Allergies  Allergen Reactions  . Azithromycin Other (See Comments)    Altered mental status  . Diazepam Other (See Comments)    REACTION: agitation  . Ezetimibe-Simvastatin Other (See Comments)    unknown  . Morphine Other (See Comments)    Hallucinations.  . Prednisone Other (See Comments)    Dizziness  . Sulfonamide Derivatives Hives  Medications:                                                                                                                           Prior to Admission:  Prescriptions prior to admission  Medication Sig Dispense Refill  . Cholecalciferol (VITAMIN D) 2000 UNITS tablet Take 2,000 Units by mouth daily.       . diclofenac sodium (VOLTAREN) 1 % GEL Apply 2 g topically every morning.      . digoxin (LANOXIN) 0.125 MG tablet Take 0.5 tablets (0.0625 mg total) by mouth daily.  30 tablet  1  . diltiazem (CARDIZEM CD) 120 MG 24 hr capsule Take 1 capsule (120 mg total) by mouth daily.  30 capsule  5  . Rivaroxaban (XARELTO) 15 MG TABS tablet Take 15 mg by mouth daily.       Scheduled: . bacitracin-polymyxin b   Both Eyes 6 times per day  . cefUROXime  500 mg Oral BID WC  . Chlorhexidine Gluconate Cloth  6 each Topical Q0600  . digoxin  0.125 mg Oral Daily  . diltiazem  180 mg Oral Daily  . Rivaroxaban  15 mg Oral Q supper  . sodium chloride  3 mL Intravenous Q12H    ROS:                                                                                                                                       History obtained from the patient  General ROS: negative for - chills,  fatigue, fever, night sweats, weight gain or weight loss Psychological ROS: negative for - behavioral disorder, hallucinations, memory difficulties, mood swings or suicidal ideation Ophthalmic ROS: negative for - blurry vision, double vision, eye pain or loss of vision ENT ROS: negative for - epistaxis, nasal discharge, oral lesions, sore throat, tinnitus or vertigo Allergy and Immunology ROS: negative for - hives or itchy/watery eyes Hematological and Lymphatic ROS: negative for - bleeding problems, bruising or swollen lymph nodes Endocrine ROS: negative for - galactorrhea, hair pattern changes, polydipsia/polyuria or temperature intolerance Respiratory ROS: negative for - cough, hemoptysis, shortness of breath or wheezing Cardiovascular ROS: negative for - chest pain, dyspnea on exertion, edema or irregular heartbeat Gastrointestinal ROS: negative for - abdominal pain, diarrhea, hematemesis, nausea/vomiting or stool incontinence Genito-Urinary ROS: negative for - dysuria, hematuria, incontinence or urinary frequency/urgency Musculoskeletal ROS: negative for -  joint swelling or muscular weakness Neurological ROS: as noted in HPI Dermatological ROS: negative for rash and skin lesion changes  Neurologic Examination:                                                                                                      Blood pressure 106/71, pulse 118, temperature 97.9 F (36.6 C), temperature source Oral, resp. rate 19, height 5\' 9"  (1.753 m), weight 53.2 kg (117 lb 4.6 oz), SpO2 98.00%.   General: NAD Mental Status: Alert, oriented, thought content appropriate.  Speech hypophonic fluent without evidence of aphasia.  Able to follow 3 step commands without difficulty. Cranial Nerves: II: Discs flat bilaterally; Visual fields normal, pupils equal, round, reactive to light and accommodation III,IV, VI: ptosis not present, extra-ocular motions intact bilaterally V,VII: smile symmetric, facial light  touch sensation normal bilaterally VIII: hearing normal bilaterally IX,X: gag reflex present XI: bilateral shoulder shrug XII: midline tongue extension without atrophy or fasciculations  Motor: 4/5 bilateral UE and 4-/5 bilateral LE --strength limited by joint pain Sensory: Pinprick and light touch intact throughout, bilaterally Deep Tendon Reflexes:  Right: Upper Extremity   Left: Upper extremity   biceps (C-5 to C-6) 2/4   biceps (C-5 to C-6) 2/4 tricep (C7) 2/4    triceps (C7) 2/4 Brachioradialis (C6) 2/4  Brachioradialis (C6) 2/4  Lower Extremity Lower Extremity  quadriceps (L-2 to L-4) 1/4   quadriceps (L-2 to L-4) 1/4 Achilles (S1) 0/4   Achilles (S1) 0/4  Plantars: Right: downgoing   Left: downgoing Cerebellar: normal finger-to-nose,  normal heel-to-shin test Gait: not tested CV: pulses palpable throughout    Lab Results: Basic Metabolic Panel:  Recent Labs Lab 07/20/14 1132 07/21/14 0022 07/23/14 0216 07/24/14 0430  NA 134* 135* 130* 131*  K 4.2 4.9 4.5 4.6  CL 94* 98 92* 91*  CO2 28 28 26 28   GLUCOSE 99 99 102* 100*  BUN 21 21 23 22   CREATININE 0.91 0.98 0.94 0.90  CALCIUM 9.2 8.8 8.7 8.7    Liver Function Tests:  Recent Labs Lab 07/20/14 1132 07/21/14 0022  AST 17 27  ALT 9 13  ALKPHOS 89 81  BILITOT 0.7 0.5  PROT 7.2 6.6  ALBUMIN 3.1* 2.6*    Recent Labs Lab 07/20/14 1132  LIPASE 22   No results found for this basename: AMMONIA,  in the last 168 hours  CBC:  Recent Labs Lab 07/20/14 1132 07/21/14 0022 07/22/14 0805 07/23/14 0216 07/24/14 0430  WBC 8.7 7.0 6.5 7.4 7.1  NEUTROABS 7.4  --   --   --   --   HGB 10.8* 9.8* 10.1* 9.9* 10.0*  HCT 32.1* 29.0* 30.7* 29.5* 30.1*  MCV 89.7 90.3 90.8 89.4 88.5  PLT 217 228 228 238 251    Cardiac Enzymes:  Recent Labs Lab 07/20/14 1132 07/20/14 1820 07/21/14 0022 07/21/14 0615  TROPONINI <0.30 <0.30 <0.30 <0.30    Lipid Panel: No results found for this basename: CHOL, TRIG,  HDL, CHOLHDL, VLDL, LDLCALC,  in the last 168 hours  CBG: No results found  for this basename: GLUCAP,  in the last 168 hours  Microbiology: Results for orders placed during the hospital encounter of 07/20/14  URINE CULTURE     Status: None   Collection Time    07/20/14 12:37 PM      Result Value Ref Range Status   Specimen Description URINE, CLEAN CATCH   Final   Special Requests NONE   Final   Culture  Setup Time     Final   Value: 07/20/2014 19:21     Performed at [place]     Final   Value: 45,000 COLONIES/ML     Performed at [facility]   Culture     Final   Value: Multiple bacterial morphotypes present, none predominant. Suggest appropriate recollection if clinically indicated.     Performed at [facility]   Report Status 07/21/2014 FINAL   Final  MRSA PCR SCREENING     Status: Abnormal   Collection Time    07/20/14  5:17 PM      Result Value Ref Range Status   MRSA by PCR POSITIVE (*) NEGATIVE Final   Comment:            The GeneXpert MRSA Assay (FDA     approved for NASAL specimens     only), is one component of a     comprehensive MRSA colonization     surveillance program. It is not     intended to diagnose MRSA     infection nor to guide or     monitor treatment for     MRSA infections.     RESULT CALLED TO, READ BACK BY AND VERIFIED WITH:     Hiram Gash RN AT 1909 07/20/14 BY [place]    Coagulation Studies: No results found for this basename: LABPROT, INR,  in the last 72 hours  Imaging: Mr Brain Wo Contrast  07/25/2014   CLINICAL DATA:  Stroke  EXAM: MRI HEAD WITHOUT CONTRAST  TECHNIQUE: Multiplanar, multiecho pulse sequences of the brain and surrounding structures were obtained without intravenous contrast.  COMPARISON:  MRI 01/06/2014  FINDINGS: Generalized atrophy. Multiple areas of chronic infarction including the cerebral white matter bilaterally, and the basal ganglia bilaterally. Chronic infarct left  occipital lobe which contains an area of hyperintensity on diffusion-weighted imaging. Favor chronic blood products although small area of acute infarct is possible. Correlate with any acute visual changes. No other areas of acute infarct.  Negative for mass lesion.  No shift of the midline structures.  Paranasal sinuses are clear.  IMPRESSION: Atrophy and moderately severe chronic ischemic change.  Chronic left occipital infarct as noted on the prior study. Small area of diffusion hyperintensity on the posterior aspect of this infarct. Favor chronic blood products versus a small area of acute infarct posterior to the encephalomalacia.   Electronically Signed   By: Franchot Gallo M.D.   On: 07/25/2014 10:09    Etta Quill PA-C [facility] [phone]  07/25/2014, 11:35 AM  Patient seen and examined.  Clinical course and management discussed.  Necessary edits performed.  I agree with the above.  Assessment and plan of care developed and discussed below.     Assessment: 80 y.o. male with known Afib on Xarelto who present to hospital for chest pain. Patient was started on ABx for suspected UTI.  MRI was obtained due to family noting he has had increased generalized weakness. MRI report read as likely left occipital chronic  blood but could not rule out infarct.  On review of MRI chronic blood products felt to be more likely.  On exam patient has no visual changes and no lateralizing symptoms. Weakness and confusion likely secondary to underlying medical issues.   Stroke Risk Factors - atrial fibrillation, hyperlipidemia and hypertension  Recommendations: 1.  Continue Xarelto 2.  Patient to f/u with neurology as an outpatient   Alexis Goodell, MD [facility] [phone]  07/25/2014  5:00 PM

## 2014-07-26 LAB — MAGNESIUM: Magnesium: 1.9 mg/dL (ref 1.5–2.5)

## 2014-07-26 LAB — TSH: TSH: 3.98 u[IU]/mL (ref 0.350–4.500)

## 2014-07-26 LAB — T4, FREE: Free T4: 1.08 ng/dL (ref 0.80–1.80)

## 2014-07-26 MED ORDER — AMIODARONE HCL 200 MG PO TABS
200.0000 mg | ORAL_TABLET | Freq: Two times a day (BID) | ORAL | Status: DC
  Administered 2014-07-26: 200 mg via ORAL
  Filled 2014-07-26 (×2): qty 1

## 2014-07-26 MED ORDER — BACITRACIN-POLYMYXIN B 500-10000 UNIT/GM OP OINT: TOPICAL_OINTMENT | OPHTHALMIC | Status: DC

## 2014-07-26 MED ORDER — CEFUROXIME AXETIL 500 MG PO TABS: 500.0000 mg | ORAL_TABLET | Freq: Two times a day (BID) | ORAL | Status: DC

## 2014-07-26 MED ORDER — BISACODYL 10 MG RE SUPP: 10.0000 mg | Freq: Every day | RECTAL | Status: DC | PRN

## 2014-07-26 MED ORDER — DSS 100 MG PO CAPS: 200.0000 mg | ORAL_CAPSULE | Freq: Two times a day (BID) | ORAL | Status: AC

## 2014-07-26 MED ORDER — DIGOXIN 0.0625 MG HALF TABLET
0.0625 mg | ORAL_TABLET | Freq: Every day | ORAL | Status: DC
  Administered 2014-07-26: 0.0625 mg via ORAL
  Filled 2014-07-26: qty 1

## 2014-07-26 MED ORDER — DOCUSATE SODIUM 100 MG PO CAPS
200.0000 mg | ORAL_CAPSULE | Freq: Two times a day (BID) | ORAL | Status: DC
  Administered 2014-07-26: 200 mg via ORAL
  Filled 2014-07-26: qty 2

## 2014-07-26 MED ORDER — BISACODYL 10 MG RE SUPP
10.0000 mg | Freq: Every day | RECTAL | Status: DC
  Administered 2014-07-26: 10 mg via RECTAL
  Filled 2014-07-26: qty 1

## 2014-07-26 MED ORDER — AMIODARONE HCL 200 MG PO TABS: 200.0000 mg | ORAL_TABLET | Freq: Two times a day (BID) | ORAL | Status: DC

## 2014-07-26 NOTE — Progress Notes (Signed)
Patient: [name] / Admit Date: 07/20/2014 / Date of Encounter: 07/26/2014, 6:27 AM   Subjective: Much more alert and oriented than the first time I met him. Denies c/o.   Objective: Telemetry: atrial fib rates right around 100, overnight with brief sequence of WCT (4 beats then normal then 5 beats then normal then 3 beats), question NSVT versus abberrancy as slight irregularity Physical Exam: Blood pressure 112/62, pulse 95, temperature 98.2 F (36.8 C), temperature source Axillary, resp. rate 16, height $RemoveBe'5\' 9"'pVpDBYlRF$  (1.753 m), weight 117 lb 4.6 oz (53.2 kg), SpO2 98.00%. General: Well developed cachectic elderly WM no acute distress. Head: Normocephalic, atraumatic, sclera non-icteric, no xanthomas, nares are without discharge. Conjuncitval injection and watering noted Neck: JVP not elevated. Lungs: Clear bilaterally to auscultation without wheezes, rales, or rhonchi. Breathing is unlabored. Heart: Irregular, borderline tachy, S1 S2 without murmurs, rubs, or gallops.  Abdomen: Soft, non-tender, non-distended with normoactive bowel sounds. No rebound/guarding. Extremities: No clubbing or cyanosis. No edema. Distal pedal pulses are 2+ and equal bilaterally. Neuro: Alert and oriented X 3! Moves all extremities spontaneously. Psych:  Responds to questions appropriately with a slowed affect.  Intake/Output Summary (Last 24 hours) at 07/26/14 6283 Last data filed at 07/26/14 0516  Gross per 24 hour  Intake    720 ml  Output   1400 ml  Net   -680 ml    Inpatient Medications:  . amiodarone  400 mg Oral BID  . bacitracin-polymyxin b   Both Eyes 6 times per day  . cefUROXime  500 mg Oral BID WC  . digoxin  0.125 mg Oral Daily  . Rivaroxaban  15 mg Oral Q supper  . sodium chloride  3 mL Intravenous Q12H   Infusions:    Labs:  Recent Labs  07/24/14 0430  NA 131*  K 4.6  CL 91*  CO2 28  GLUCOSE 100*  BUN 22  CREATININE 0.90  CALCIUM 8.7     Recent Labs  07/24/14 0430  WBC  7.1  HGB 10.0*  HCT 30.1*  MCV 88.5  PLT 251    Radiology/Studies:  Ct Head Wo Contrast  07/20/2014   CLINICAL DATA:  Generalized weakness and mental status change.  EXAM: CT HEAD WITHOUT CONTRAST  TECHNIQUE: Contiguous axial images were obtained from the base of the skull through the vertex without intravenous contrast.  COMPARISON:  01/05/2014.  FINDINGS: Stable age related cerebral atrophy, ventriculomegaly, periventricular white matter disease and remote lacunar-type basal ganglia infarcts. Evolutionary change in the left PCA infarct noted on the prior MRI with encephalomalacia. No definite acute infarct and no intracranial hemorrhage, mass lesion or extra-axial fluid collection.  The bony structures are intact. The paranasal sinuses and mastoid air cells are grossly clear. The globes are intact.  IMPRESSION: 1. Stable age related cerebral atrophy, ventriculomegaly and periventricular white matter disease. 2. Remote lacunar-type basal ganglia infarcts an remote left PCA infarct. 3. No acute intracranial findings, mass lesion or skull fracture.   Electronically Signed   By: Kalman Jewels M.D.   On: 07/20/2014 13:23   Mr Brain Wo Contrast  07/25/2014   CLINICAL DATA:  Stroke  EXAM: MRI HEAD WITHOUT CONTRAST  TECHNIQUE: Multiplanar, multiecho pulse sequences of the brain and surrounding structures were obtained without intravenous contrast.  COMPARISON:  MRI 01/06/2014  FINDINGS: Generalized atrophy. Multiple areas of chronic infarction including the cerebral white matter bilaterally, and the basal ganglia bilaterally. Chronic infarct left occipital lobe which contains an area of hyperintensity on  diffusion-weighted imaging. Favor chronic blood products although small area of acute infarct is possible. Correlate with any acute visual changes. No other areas of acute infarct.  Negative for mass lesion.  No shift of the midline structures.  Paranasal sinuses are clear.  IMPRESSION: Atrophy and  moderately severe chronic ischemic change.  Chronic left occipital infarct as noted on the prior study. Small area of diffusion hyperintensity on the posterior aspect of this infarct. Favor chronic blood products versus a small area of acute infarct posterior to the encephalomalacia.   Electronically Signed   By: Franchot Gallo M.D.   On: 07/25/2014 10:09   Dg Chest Portable 1 View  07/20/2014   CLINICAL DATA:  Chest pain  EXAM: PORTABLE CHEST - 1 VIEW  COMPARISON:  January 06, 2014  FINDINGS: There is underlying emphysematous change. There are areas of mild scarring bilaterally. There is no frank edema or consolidation. Heart is upper normal in size with pulmonary vascularity reflecting the underlying emphysematous change. There is stable apical pleural thickening bilaterally, slightly more on the left than on the right. No adenopathy. There is atherosclerotic change in the aorta.  IMPRESSION: Underlying emphysema with areas of lung scarring. No edema or consolidation.   Electronically Signed   By: Lowella Grip M.D.   On: 07/20/2014 12:40     Assessment and Plan  1. Altered mental status, MRI with chronic blood products versus small new infarct, neuro on board 2. H/o multiple embolic strokes  3. Permanent atrial fibrillation with historically difficult to control rates 4. Bladder cancer with intermittent gross hematuria  5. Anemia due to #4, stable 6. UTI  7. Weight loss and possible failure to thrive, will need further outpt monitoring 8. WCT  HR improved after first dose of amiodarone yesterday (only got 1 so far). Suspect it will continue to improve as he is loaded. Per Dr. Angelena Form, load with 200 mg BID for two weeks then reduce to 200 mg po daily. Send baseline thyroid function studies. Send Mg given brief WCT - recommend to replete if <2.0. K is OK. Cardizem was d/c'd after AM dose yesterday. With addition of amio, will need to keep a close eye on his digoxin. Will discuss leaving at  current dose versus dropping back down to home dose of 0.$RemoveB'0625mg'gNbXkzIE$ .  Signed, Melina Copa PA-C  I have personally seen and examined this patient with Melina Copa, PA-C. I agree with the assessment and plan as outlined above. Will continue amiodarone loading. Reduce digoxin to home dose. Continue Xarelto. OK to d/c home. Follow up with Dr. Mare Ferrari in 2-3 weeks.   [name] 07/26/2014 8:02 AM

## 2014-07-26 NOTE — Discharge Summary (Signed)
[name], is a 80 y.o. male  DOB Oct 19, 1927  MRN [id number].  Admission date:  07/20/2014  Admitting Physician  Donne Hazel, MD  Discharge Date:  07/26/2014   Primary MD  Cathlean Cower, MD  Recommendations for primary care physician for things to follow:   Repeat CBC BMP in 3-4 days.  Needs continued PT, OT and speech therapy.  Outpatient followup with cardiology and neurology within 1-2 weeks  ENT followup in one to 2 weeks for ear wax removal    Admission Diagnosis  Chronic airway obstruction, not elsewhere classified [496] Dehydration [276.51] UTI (lower urinary tract infection) [599.0] Atrial fibrillation with rapid ventricular response [427.31] Hematuria [599.70]   Discharge Diagnosis  Chronic airway obstruction, not elsewhere classified [496] Dehydration [276.51] UTI (lower urinary tract infection) [599.0] Atrial fibrillation with rapid ventricular response [427.31] Hematuria [599.70]     Principal Problem:   Atrial fibrillation with rapid ventricular response Active Problems:   NEOPLASM, MALIGNANT, BLADDER   ANEMIA-NOS   HYPERTENSION   COPD   CVA (cerebral vascular accident)-Acute right small nonhemorrhagic infarct seen on MRI   UTI (lower urinary tract infection)   Protein-calorie malnutrition, severe      Past Medical History  Diagnosis Date  . Bladder cancer     a. Dr. Lawerance Bach - [facility] Multiple surgeries, chemo trial.  . Hyperlipidemia   . Anemia 04/10/2009  . Immune thrombocytopenic purpura     a. s/p splenectomy.  Marland Kitchen Anxiety   . Hypertension   . Permanent atrial fibrillation     a. Historically difficult to control rates  . COPD   . TIA (transient ischemic attack)     a. H/o TIA 2007.  . Stroke     a. H/o remote lacunar infarcts, at least 3 of them. b. Right lenticular nucleus and  corona radiata infarct, cardioembolic 03/980. c. Stroke 11/2013, new infarct 2 weeks later 12/2013.  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 Gross hematuria     a. H/o bladder CA with intermittent gross hematuria.    Past Surgical History  Procedure Laterality Date  . Splenectomy    . Bilateral vats ablation    . Facial cancer      facial skin cancer       History of present illness and  Hospital Course:     Kindly see H&P for history of present illness and admission details, please review complete Labs, Consult reports and Test reports for all details in brief  HPI  from the history and physical done on the day of admission  [name] is a 80 y.o. male With a hx of HTN, afib, bladder cancer, CVA, COPD who presents to the ED with complaints of chest pains. In the ED, pt noted to have HR in the 130-160 range, started on NS. Pt also noted to have large blood and leukocytes in urine. Pt started on empiric rocephin and cardizem gtt  initiated. Initial trop was neg. Hospitalist service consulted for consideration for admission.   Hospital Course    1. Afib with rvr  1. Cardizem gtt was started in ED and eventually weaned to off 2. On chronic anticoagulation per below 3. On digoxin and Cardizem as well, blood pressure were running low with rate greater than 110, EF 55%, cardiology was consulted and has been placed on amiodarone now, dose to be changed to 200 mg daily after 2 weeks from today. Outpatient cardiology followup post discharge.    2. Hematuria  1. Likely related to hx of bladder cancer with concurrent anticoagulation 2. Family cont to request xarelto to be continued despite gross hematuria. See admit h and p. 3. Hgb stable, with primary urologist as needed. Monitor postvoid residuals.   3. Chronic anemia  1. Hgb stable around 10 2. Monitor closely 3. Cont on xarelto per family  request (see above)   4. Copd - Stable   5. Hx cva with bilateral weakness as CVA was bihemispheric acute decompensation due to UTI.  1. Has chronic weakness outpatient PT OT to be continued at SNF. Frail and weak at baseline, right-sided weakness worse than left. MRI of the brain was obtained upon family requested S. date the heart weakness is continuing far too long. MRI done shows changes consistent with old left occipital infarct with some retained blood products versus small extension of the old infarct. He is already on maximal treatment with xaralto and statin, neuro consulted. They agree with continuing present medical treatment. Outpatient followup with PT OT and speech at SNF. Outpatient follow with neurology.   6. Suspected uti  1. Rocephin started in ED, switched to Ceftin be continued for 2 more days, sample was poor culture unreliable.   7. Suspected Conjunctivitis  1. Pt with B red eyes and "eyes glued shut in the morning" per wife 2. Started empiric abx eye drops, continue for another week.         8. Ear wax  3. Pt with marked cerumen in B ears R>L 4. Recommend outpatient ENT referral on discharge      Discharge Condition: stable   Follow UP  Follow-up Information   Follow up with Cathlean Cower, MD. Schedule an appointment as soon as possible for a visit in 1 week.   Specialties:  Internal Medicine, Radiology   Contact information:   [address] [place] [address] [phone]       Follow up with Darlin Coco, MD. Schedule an appointment as soon as possible for a visit in 1 week.   Specialty:  Cardiology   Contact information:   [address] [place] [address] [phone]       Follow up with [facility]. Schedule an appointment as soon as possible for a visit in 1 month.   Contact information:   [address] [place] [address] [phone]        Discharge Instructions  and   Discharge Medications         Discharge Instructions   Discharge instructions    Complete by:  As directed   Follow with Primary MD Cathlean Cower, MD or SNF M.D. in 7 days   Get CBC, CMP, 2 view Chest X ray checked  by Primary MD or SNF M.D. next visit.    Activity: As tolerated with Full fall precautions use walker/cane & assistance as needed   Disposition Home  Diet: Heart Healthy with full feeding assistance and aspiration precautions    For Heart failure patients - Check your Weight same time everyday, if you gain over 2 pounds, or you develop in leg swelling, experience more shortness of breath or chest pain, call your Primary MD immediately. Follow Cardiac Low Salt Diet and 1.8 lit/day fluid restriction.   On your next visit with her primary care physician please Get Medicines reviewed and adjusted.  Please request your Prim.MD to go over all Hospital Tests and Procedure/Radiological results at the follow up, please get all Hospital records sent to your Prim MD by signing hospital release before you go home.   If you experience worsening of your admission symptoms, develop shortness of breath, life threatening emergency, suicidal or homicidal thoughts you must seek medical attention immediately by calling 911 or calling your MD immediately  if symptoms less severe.  You Must read complete instructions/literature along with all the possible adverse reactions/side effects for all the Medicines you take and that have been prescribed to you. Take any new Medicines after you have completely understood and accpet all the possible adverse reactions/side effects.   Do not drive, operating heavy machinery, perform activities at heights, swimming or participation in water activities or provide baby sitting services if your were admitted for syncope or siezures until you have seen by Primary MD or a Neurologist and advised to do so again.  Do not drive when taking Pain medications.     Do not take more than prescribed Pain, Sleep and Anxiety Medications  Special Instructions: If you have smoked or chewed Tobacco  in the last 2 yrs please stop smoking, stop any regular Alcohol  and or any Recreational drug use.  Wear Seat belts while driving.   Please note  You were cared for by a hospitalist during your hospital stay. If you have any questions about your discharge medications or the care you received while you were in the hospital after you are discharged, you can call the unit and asked to speak with the hospitalist on call if the hospitalist that took care of you is not available. Once you are discharged, your primary care physician will handle any further medical issues. Please note that NO REFILLS for any discharge medications will be authorized once you are discharged, as it is imperative that you return to your primary care physician (or establish a relationship with a primary care physician if you do not have one) for your aftercare needs so that they can reassess your need for medications and monitor your lab values.     Increase activity slowly    Complete by:  As directed             Medication List         amiodarone 200 MG tablet  Commonly known as:  PACERONE  Take 1 tablet (200 mg total) by mouth 2 (two) times daily. Present dose for 2 weeks then 200 mg daily after 2 weeks.     bacitracin-polymyxin b ophthalmic ointment  Commonly known as:  POLYSPORIN  Place into both eyes every 4 (four) hours. apply to eye every 12 hours while awake     bisacodyl 10 MG suppository  Commonly known as:  DULCOLAX  Place 1 suppository (10 mg total) rectally daily as needed for moderate constipation.     cefUROXime 500 MG tablet  Commonly known as:  CEFTIN  Take 1 tablet (500 mg total) by mouth 2 (two) times daily  with a meal. 2 more days     diclofenac sodium 1 % Gel  Commonly known as:  VOLTAREN  Apply 2 g topically every morning.     digoxin 0.125 MG tablet   Commonly known as:  LANOXIN  Take 1 tablet (0.125 mg total) by mouth daily.     diltiazem 180 MG 24 hr capsule  Commonly known as:  CARDIZEM CD  Take 1 capsule (180 mg total) by mouth daily.     DSS 100 MG Caps  Take 200 mg by mouth 2 (two) times daily.     Rivaroxaban 15 MG Tabs tablet  Commonly known as:  XARELTO  Take 15 mg by mouth daily.     Vitamin D 2000 UNITS tablet  Take 2,000 Units by mouth daily.          Diet and Activity recommendation: See Discharge Instructions above   Consults obtained - Cards, Neuro   Major procedures and Radiology Reports - PLEASE review detailed and final reports for all details, in brief -      Ct Head Wo Contrast  07/20/2014   CLINICAL DATA:  Generalized weakness and mental status change.  EXAM: CT HEAD WITHOUT CONTRAST  TECHNIQUE: Contiguous axial images were obtained from the base of the skull through the vertex without intravenous contrast.  COMPARISON:  01/05/2014.  FINDINGS: Stable age related cerebral atrophy, ventriculomegaly, periventricular white matter disease and remote lacunar-type basal ganglia infarcts. Evolutionary change in the left PCA infarct noted on the prior MRI with encephalomalacia. No definite acute infarct and no intracranial hemorrhage, mass lesion or extra-axial fluid collection.  The bony structures are intact. The paranasal sinuses and mastoid air cells are grossly clear. The globes are intact.  IMPRESSION: 1. Stable age related cerebral atrophy, ventriculomegaly and periventricular white matter disease. 2. Remote lacunar-type basal ganglia infarcts an remote left PCA infarct. 3. No acute intracranial findings, mass lesion or skull fracture.   Electronically Signed   By: Kalman Jewels M.D.   On: 07/20/2014 13:23   Mr Brain Wo Contrast  07/25/2014   CLINICAL DATA:  Stroke  EXAM: MRI HEAD WITHOUT CONTRAST  TECHNIQUE: Multiplanar, multiecho pulse sequences of the brain and surrounding structures were obtained  without intravenous contrast.  COMPARISON:  MRI 01/06/2014  FINDINGS: Generalized atrophy. Multiple areas of chronic infarction including the cerebral white matter bilaterally, and the basal ganglia bilaterally. Chronic infarct left occipital lobe which contains an area of hyperintensity on diffusion-weighted imaging. Favor chronic blood products although small area of acute infarct is possible. Correlate with any acute visual changes. No other areas of acute infarct.  Negative for mass lesion.  No shift of the midline structures.  Paranasal sinuses are clear.  IMPRESSION: Atrophy and moderately severe chronic ischemic change.  Chronic left occipital infarct as noted on the prior study. Small area of diffusion hyperintensity on the posterior aspect of this infarct. Favor chronic blood products versus a small area of acute infarct posterior to the encephalomalacia.   Electronically Signed   By: Franchot Gallo M.D.   On: 07/25/2014 10:09   Dg Chest Portable 1 View  07/20/2014   CLINICAL DATA:  Chest pain  EXAM: PORTABLE CHEST - 1 VIEW  COMPARISON:  January 06, 2014  FINDINGS: There is underlying emphysematous change. There are areas of mild scarring bilaterally. There is no frank edema or consolidation. Heart is upper normal in size with pulmonary vascularity reflecting the underlying emphysematous change. There is stable apical pleural thickening bilaterally,  slightly more on the left than on the right. No adenopathy. There is atherosclerotic change in the aorta.  IMPRESSION: Underlying emphysema with areas of lung scarring. No edema or consolidation.   Electronically Signed   By: Lowella Grip M.D.   On: 07/20/2014 12:40    Micro Results      Recent Results (from the past 240 hour(s))  URINE CULTURE     Status: None   Collection Time    07/20/14 12:37 PM      Result Value Ref Range Status   Specimen Description URINE, CLEAN CATCH   Final   Special Requests NONE   Final   Culture  Setup Time      Final   Value: 07/20/2014 19:21     Performed at [facility]     Final   Value: 45,000 COLONIES/ML     Performed at [facility]   Culture     Final   Value: Multiple bacterial morphotypes present, none predominant. Suggest appropriate recollection if clinically indicated.     Performed at [facility]   Report Status 07/21/2014 FINAL   Final  MRSA PCR SCREENING     Status: Abnormal   Collection Time    07/20/14  5:17 PM      Result Value Ref Range Status   MRSA by PCR POSITIVE (*) NEGATIVE Final   Comment:            The GeneXpert MRSA Assay (FDA     approved for NASAL specimens     only), is one component of a     comprehensive MRSA colonization     surveillance program. It is not     intended to diagnose MRSA     infection nor to guide or     monitor treatment for     MRSA infections.     RESULT CALLED TO, READ BACK BY AND VERIFIED WITH:     Hiram Gash RN AT 1909 07/20/14 BY Karie Chimera       Today   Subjective:   [name] today has no headache,no chest abdominal pain,no new weakness tingling or numbness, feels much better wants.  Objective:   Blood pressure 112/62, pulse 95, temperature 98.2 F (36.8 C), temperature source Axillary, resp. rate 16, height 5\' 9"  (1.753 m), weight 53.2 kg (117 lb 4.6 oz), SpO2 98.00%.   Intake/Output Summary (Last 24 hours) at 07/26/14 1119 Last data filed at 07/26/14 2992  Gross per 24 hour  Intake    660 ml  Output   1400 ml  Net   -740 ml    Exam Awake Alert, Oriented x 3, No new F.N deficits, Normal affect, chronic right more than left sided weakness facial droop from previous multiple CVAs. [place].AT,PERRAL Supple Neck,No JVD, No cervical lymphadenopathy appriciated.  Symmetrical Chest wall movement, Good air movement bilaterally, CTAB RRR,No Gallops,Rubs or new Murmurs, No Parasternal Heave +ve B.Sounds, Abd Soft, Non tender, No organomegaly appriciated, No rebound -guarding or  rigidity. No Cyanosis, Clubbing or edema, No new Rash or bruise  Data Review   CBC w Diff: Lab Results  Component Value Date   WBC 7.1 07/24/2014   HGB 10.0* 07/24/2014   HCT 30.1* 07/24/2014   PLT 251 07/24/2014   LYMPHOPCT 8* 07/20/2014   MONOPCT 7 07/20/2014   EOSPCT 0 07/20/2014   BASOPCT 0 07/20/2014    CMP: Lab Results  Component Value Date   NA 131*  07/24/2014   K 4.6 07/24/2014   CL 91* 07/24/2014   CO2 28 07/24/2014   BUN 22 07/24/2014   CREATININE 0.90 07/24/2014   PROT 6.6 07/21/2014   ALBUMIN 2.6* 07/21/2014   BILITOT 0.5 07/21/2014   ALKPHOS 81 07/21/2014   AST 27 07/21/2014   ALT 13 07/21/2014  .   Total Time in preparing paper work, data evaluation and todays exam - 35 minutes  Thurnell Lose M.D on 07/26/2014 at 11:19 AM  [facility]  [phone]   **Disclaimer: This note may have been dictated with voice recognition software. Similar sounding words can inadvertently be transcribed and this note may contain transcription errors which may not have been corrected upon publication of note.**

## 2014-07-26 NOTE — Clinical Social Work Placement (Signed)
Clinical Social Work Department CLINICAL SOCIAL WORK PLACEMENT NOTE 07/26/2014  Patient:  [name], [name]  Account Number:  [id number] [place] date:  07/20/2014  Clinical Social Worker:  Megan Salon  Date/time:  07/24/2014 12:17 PM  Clinical Social Work is seeking post-discharge placement for this patient at the following level of care:   [place]   (*CSW will update this form in Epic as items are completed)   07/24/2014  Patient/family provided with [place] Department of Clinical Social Work's list of facilities offering this level of care within the geographic area requested by the patient (or if unable, by the patient's family).  07/24/2014  Patient/family informed of their freedom to choose among providers that offer the needed level of care, that participate in Medicare, Medicaid or managed care program needed by the patient, have an available bed and are willing to accept the patient.  07/24/2014  Patient/family informed of [facility]' ownership interest in [facility], as well as of the fact that they are under no obligation to receive care at this facility.  PASARR submitted to EDS on 07/24/2014 PASARR number received on 07/24/2014  FL2 transmitted to all facilities in geographic area requested by pt/family on  07/24/2014 FL2 transmitted to all facilities within larger geographic area on   Patient informed that his/her managed care company has contracts with or will negotiate with  certain facilities, including the following:     Patient/family informed of bed offers received:  07/25/2014 Patient chooses bed at [facility] Physician recommends and patient chooses bed at    Patient to be transferred to [place] on  07/26/2014 Patient to be transferred to facility by Ambulance Patient and family notified of transfer on 07/26/2014 Name of family member notified:  Charlyne Quale  The following physician request were entered in  Epic:   Additional Comments: Per MD patient ready for DC to [facility]. RN, patient, patient's wife, and facility notified of DC. RN given number for report. DC packet on chart. Ambulance transport requested for 2pm. CSW signing off at this time.    Liz Beach MSW, [place], [place], [phone]

## 2014-07-26 NOTE — Discharge Instructions (Signed)
Follow with Primary MD Cathlean Cower, MD or SNF M.D. in 7 days   Get CBC, CMP, 2 view Chest X ray checked  by Primary MD or SNF M.D. next visit.    Activity: As tolerated with Full fall precautions use walker/cane & assistance as needed   Disposition Home     Diet: Heart Healthy with full feeding assistance and aspiration precautions    For Heart failure patients - Check your Weight same time everyday, if you gain over 2 pounds, or you develop in leg swelling, experience more shortness of breath or chest pain, call your Primary MD immediately. Follow Cardiac Low Salt Diet and 1.8 lit/day fluid restriction.   On your next visit with her primary care physician please Get Medicines reviewed and adjusted.  Please request your Prim.MD to go over all Hospital Tests and Procedure/Radiological results at the follow up, please get all Hospital records sent to your Prim MD by signing hospital release before you go home.   If you experience worsening of your admission symptoms, develop shortness of breath, life threatening emergency, suicidal or homicidal thoughts you must seek medical attention immediately by calling 911 or calling your MD immediately  if symptoms less severe.  You Must read complete instructions/literature along with all the possible adverse reactions/side effects for all the Medicines you take and that have been prescribed to you. Take any new Medicines after you have completely understood and accpet all the possible adverse reactions/side effects.   Do not drive, operating heavy machinery, perform activities at heights, swimming or participation in water activities or provide baby sitting services if your were admitted for syncope or siezures until you have seen by Primary MD or a Neurologist and advised to do so again.  Do not drive when taking Pain medications.    Do not take more than prescribed Pain, Sleep and Anxiety Medications  Special Instructions: If you have smoked or  chewed Tobacco  in the last 2 yrs please stop smoking, stop any regular Alcohol  and or any Recreational drug use.  Wear Seat belts while driving.   Please note  You were cared for by a hospitalist during your hospital stay. If you have any questions about your discharge medications or the care you received while you were in the hospital after you are discharged, you can call the unit and asked to speak with the hospitalist on call if the hospitalist that took care of you is not available. Once you are discharged, your primary care physician will handle any further medical issues. Please note that NO REFILLS for any discharge medications will be authorized once you are discharged, as it is imperative that you return to your primary care physician (or establish a relationship with a primary care physician if you do not have one) for your aftercare needs so that they can reassess your need for medications and monitor your lab values.

## 2014-07-26 NOTE — Progress Notes (Signed)
Pt D/C'd to SNF [facility]  Alert and oriented x3.  No c/o pain.  Education given on diet, activity, meds, and follow-up care and appointments.  Pt wife verbalized understanding.  Education will be transported with pt.  Pt transferred via EMS.  IV D/C'd.

## 2014-07-26 NOTE — Progress Notes (Signed)
Report called to [place] team.

## 2014-07-26 NOTE — Progress Notes (Signed)
Physical Therapy Treatment Patient Details Name: [name] MRN: [id number] DOB: 1927-04-22 Today's Date: 07/26/2014    History of Present Illness Pt is an 80 y/o male admitted with A-fib with rapid ventricular response. Pt coming from home where wife was assisting with most mobility and ADL's.     PT Comments    Pt making very good progress, even during session progressed from mod A to stand to min A with gait with RW.  Pt able to ambulate to/from restroom in order to have bowel movement.  Wife in room stating that he had wanted to come back to CIR, however insurance declining at this time and he is to D/C to [facility].  Wife pleased with progress.   Follow Up Recommendations  SNF;Supervision/Assistance - 24 hour     Equipment Recommendations  None recommended by PT    Recommendations for Other Services       Precautions / Restrictions Precautions Precautions: Fall Precaution Comments: Residual weakness on R>L side from previous stroke. Restrictions Weight Bearing Restrictions: No    Mobility  Bed Mobility Overal bed mobility: Needs Assistance Bed Mobility: Supine to Sit     Supine to sit: Mod assist     General bed mobility comments: Pt with good initaition of movement, however requires assist to scoot hips to EOB prior to standing.    Transfers Overall transfer level: Needs assistance Equipment used: Rolling walker (2 wheeled) Transfers: Sit to/from Stand Sit to Stand: Mod assist         General transfer comment: Requires mod A to stand from bed and from 3in1 over toilet.  Facilitation for increased forward weight shift and mod cues for hand placement.   Ambulation/Gait Ambulation/Gait assistance: Min assist;Mod assist Ambulation Distance (Feet): 20 Feet (and another 15) Assistive device: Rolling walker (2 wheeled) Gait Pattern/deviations: Step-to pattern;Step-through pattern;Decreased stride length;Decreased stance time - right;Decreased step length -  left;Shuffle;Trunk flexed;Narrow base of support Gait velocity: decreased   General Gait Details: Pt initially mod A for gait, however as he continued he progressed to min A.  Requires assist for weight shifting and also mod verbal cues for stepping and turning sequence with RW.    Stairs            Wheelchair Mobility    Modified Rankin (Stroke Patients Only)       Balance Overall balance assessment: Needs assistance Sitting-balance support: Feet supported Sitting balance-Leahy Scale: Fair   Postural control: Posterior lean Standing balance support: During functional activity Standing balance-Leahy Scale: Poor Standing balance comment: Requires min A to maintain standing at times.                     Cognition Arousal/Alertness: Awake/alert Behavior During Therapy: WFL for tasks assessed/performed Overall Cognitive Status: Impaired/Different from baseline Area of Impairment: Memory;Following commands;Safety/judgement;Problem solving     Memory: Decreased short-term memory Following Commands: Follows one step commands consistently Safety/Judgement: Decreased awareness of safety   Problem Solving: Slow processing;Difficulty sequencing;Requires verbal cues;Requires tactile cues      Exercises      General Comments        Pertinent Vitals/Pain Pt with no c/o pain during session.     Home Living                      Prior Function            PT Goals (current goals can now be found in the care plan section)  Acute Rehab PT Goals Patient Stated Goal: pt and wife wanted to go to CIR but cannot due so because of insurance, will go to SNF first PT Goal Formulation: With patient/family Time For Goal Achievement: 08/05/14 Potential to Achieve Goals: Good Progress towards PT goals: Progressing toward goals    Frequency  Min 3X/week    PT Plan Current plan remains appropriate    Co-evaluation             End of Session Equipment  Utilized During Treatment: Gait belt Activity Tolerance: Patient tolerated treatment well Patient left: in chair;with family/visitor present;with call bell/phone within reach     Time: 1032-1117 PT Time Calculation (min): 45 min  Charges:  $Gait Training: 8-22 mins $Therapeutic Activity: 38-52 mins                    G Codes:      Denice Bors 07/26/2014, 12:25 PM

## 2014-07-29 ENCOUNTER — Non-Acute Institutional Stay (SKILLED_NURSING_FACILITY): Payer: Medicare Other | Admitting: Adult Health

## 2014-07-29 ENCOUNTER — Encounter: Payer: Self-pay | Admitting: Adult Health

## 2014-07-29 DIAGNOSIS — I6350 Cerebral infarction due to unspecified occlusion or stenosis of unspecified cerebral artery: Secondary | ICD-10-CM

## 2014-07-29 DIAGNOSIS — R5381 Other malaise: Secondary | ICD-10-CM | POA: Insufficient documentation

## 2014-07-29 DIAGNOSIS — I4891 Unspecified atrial fibrillation: Secondary | ICD-10-CM

## 2014-07-29 DIAGNOSIS — I639 Cerebral infarction, unspecified: Secondary | ICD-10-CM

## 2014-07-29 DIAGNOSIS — K5909 Other constipation: Secondary | ICD-10-CM

## 2014-07-29 NOTE — Progress Notes (Signed)
Patient ID: [name], male   DOB: 1927/09/09, 80 y.o.   MRN: [id number]     [facility]  Allergies  Allergen Reactions  . Azithromycin Other (See Comments)    Altered mental status  . Diazepam Other (See Comments)    REACTION: agitation  . Ezetimibe-Simvastatin Other (See Comments)    unknown  . Morphine Other (See Comments)    Hallucinations.  . Prednisone Other (See Comments)    Dizziness  . Sulfonamide Derivatives Hives     Chief Complaint  Patient presents with  . Hospitalization Follow-up    HPI:  He has been hospitalized due to weakness; chest pain. He was treated for an uti with a poor colony count. He had a rapid hear rate due to his afib. He has been started on amiodarone for rate control. His wife states that his heart rate is under control for the first time in a long time. He is too weak for him to go home at this time and he is here for short term rehab. His wife at this time; plans on taking him back home once he has completed his therapy. His post hospital chest x-ray is negative for pneumonia. He is due for lab work in the am.     Past Medical History  Diagnosis Date  . Bladder cancer     a. Dr. Lawerance Bach - [facility]. Multiple surgeries, chemo trial.  . Hyperlipidemia   . Anemia 04/10/2009  . Immune thrombocytopenic purpura     a. s/p splenectomy.  Marland Kitchen Anxiety   . Hypertension   . Permanent atrial fibrillation     a. Historically difficult to control rates  . COPD   . TIA (transient ischemic attack)     a. H/o TIA 2007.  . Stroke     a. H/o remote lacunar infarcts, at least 3 of them. b. Right lenticular nucleus and corona radiata infarct, cardioembolic 0/1093. c. Stroke 11/2013, new infarct 2 weeks later 12/2013.  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 Gross hematuria     a. H/o bladder CA with intermittent gross hematuria.    Past Surgical History  Procedure  Laterality Date  . Splenectomy    . Bilateral vats ablation    . Facial cancer      facial skin cancer    VITAL SIGNS BP 102/60  Pulse 80  Ht 5\' 9"  (1.753 m)  Wt 117 lb (53.071 kg)  BMI 17.27 kg/m2  SpO2 95%   Patient's Medications  New Prescriptions   No medications on file  Previous Medications   AMIODARONE (PACERONE) 200 MG TABLET    Take 1 tablet (200 mg total) by mouth 2 (two) times daily. Present dose for 2 weeks then 200 mg daily after 2 weeks.   BACITRACIN-POLYMYXIN B (POLYSPORIN) OPHTHALMIC OINTMENT    Place into both eyes every 4 (four) hours. apply to eye every 12 hours while awake   BISACODYL (DULCOLAX) 10 MG SUPPOSITORY    Place 1 suppository (10 mg total) rectally daily as needed for moderate constipation.   CHOLECALCIFEROL (VITAMIN D) 2000 UNITS TABLET    Take 2,000 Units by mouth daily.    DICLOFENAC SODIUM (VOLTAREN) 1 % GEL    Apply 2 g topically every morning.   DIGOXIN (LANOXIN) 0.125 MG TABLET    Take 1 tablet (0.125 mg total) by mouth daily.   DILTIAZEM (CARDIZEM CD) 180  MG 24 HR CAPSULE    Take 1 capsule (180 mg total) by mouth daily.   DOCUSATE SODIUM 100 MG CAPS    Take 200 mg by mouth 2 (two) times daily.   RIVAROXABAN (XARELTO) 15 MG TABS TABLET    Take 15 mg by mouth daily.  Modified Medications   No medications on file  Discontinued Medications   CEFUROXIME (CEFTIN) 500 MG TABLET    Take 1 tablet (500 mg total) by mouth 2 (two) times daily with a meal. 2 more days    SIGNIFICANT DIAGNOSTIC EXAMS  07-20-14: ct of head: 1. Stable age related cerebral atrophy, ventriculomegaly and periventricular white matter disease. 2. Remote lacunar-type basal ganglia infarcts an remote left PCA infarct. 3. No acute intracranial findings, mass lesion or skull fracture.  07-20-14: chest x-ray: Underlying emphysema with areas of lung scarring. No edema or consolidation.  07-25-14: mri of head: Atrophy and moderately severe chronic ischemic change. Chronic left  occipital infarct as noted on the prior study. Small area of diffusion hyperintensity on the posterior aspect of this infarct. Favor chronic blood products versus a small area of acute infarct posterior to the encephalomalacia.  07-29-14: chest x-ray: minimal basilar subsegmental atelectatic changes but otherwise negative for focal pneumonia. 2. Negative for chf.     LABS REVIEWED:   07-20-14: wbc 8.7; hgb 10.8; hct 32.1; mcv 89.7; plt 217; glucose 99; bun 21; creat 0.91; k+4.2; na++134;liver normal albumin 3.1; dig <0.3; Urine culture: 45,000 units: mixed bacteria 07-24-14; wbc 7.1; hgb 10.0; hct 30.1; mcv 88.5; lt 251; glucose 100 ;bun 22; creat 0.9; k+4.6; na++131  07-26-14: tsh 3.980; free t4: 1.9      Review of Systems  Unable to perform ROS     Physical Exam  Constitutional: No distress.  frail  Eyes: Conjunctivae are normal. Pupils are equal, round, and reactive to light.  Neck: Neck supple. No JVD present. No thyromegaly present.  Cardiovascular: Normal rate, regular rhythm and intact distal pulses.   Heart rate regular   Respiratory: Effort normal and breath sounds normal. No respiratory distress. He has no wheezes.  GI: Soft. Bowel sounds are normal. He exhibits no distension. There is no tenderness.  Musculoskeletal: He exhibits no edema.  Is able to move all extremities; has generalized weakness present.   Neurological: He is alert.  Skin: Skin is warm and dry. He is not diaphoretic.       ASSESSMENT/ PLAN:   1. UTI: he has completed his abt; will continue to monitor his status. Will not make further changes at this time. Has hematuria chronic in nature related to history of bladder cancer and use of xarelto. His family desires to continue the use of this medication.  2. Afib: his heart rate is stable; he is presently taking amiodarone 200 mg twice daily through 08-09-14 will then begin amiodarone 200 mg daily. Will continue digoxin 0.125 mg daily; both for rate  control. Will continue cardizem cd 180 mg and will hold for systolic blood pressure of <120. Will continue xarelto 15 mg daily.  Will continue to monitor his status.   3. CVA: he is neurologically stable will continue his xarelto 15 mg daily and will monitor his status.   4. Constipation: will continue colace 200 mg twice daily   5. Physical deconditioning: will continue therapy as directed to improve upon his strength; gait; mobility and independence with his adl's. His goal remains at this time to return home with family.  Time spent with patient 50 minutes.       Ok Edwards NP [facility]  Contact [phone] Monday through Friday 8am- 5pm  After hours call [phone]

## 2014-07-30 ENCOUNTER — Emergency Department (HOSPITAL_COMMUNITY): Payer: Medicare Other

## 2014-07-30 ENCOUNTER — Inpatient Hospital Stay (HOSPITAL_COMMUNITY): Payer: Medicare Other

## 2014-07-30 ENCOUNTER — Inpatient Hospital Stay (HOSPITAL_COMMUNITY)
Admission: EM | Admit: 2014-07-30 | Discharge: 2014-08-05 | DRG: 064 | Disposition: A | Discharge status: Skilled Nursing Facility | Payer: Medicare Other | Attending: Internal Medicine | Admitting: Internal Medicine

## 2014-07-30 ENCOUNTER — Encounter (HOSPITAL_COMMUNITY): Payer: Self-pay | Admitting: Emergency Medicine

## 2014-07-30 DIAGNOSIS — I6389 Other cerebral infarction: Secondary | ICD-10-CM

## 2014-07-30 DIAGNOSIS — Z66 Do not resuscitate: Secondary | ICD-10-CM | POA: Diagnosis present

## 2014-07-30 DIAGNOSIS — Z8673 Personal history of transient ischemic attack (TIA), and cerebral infarction without residual deficits: Secondary | ICD-10-CM | POA: Diagnosis not present

## 2014-07-30 DIAGNOSIS — R7989 Other specified abnormal findings of blood chemistry: Secondary | ICD-10-CM

## 2014-07-30 DIAGNOSIS — I6350 Cerebral infarction due to unspecified occlusion or stenosis of unspecified cerebral artery: Principal | ICD-10-CM | POA: Diagnosis present

## 2014-07-30 DIAGNOSIS — Z87440 Personal history of urinary (tract) infections: Secondary | ICD-10-CM | POA: Diagnosis not present

## 2014-07-30 DIAGNOSIS — R5381 Other malaise: Secondary | ICD-10-CM

## 2014-07-30 DIAGNOSIS — G9340 Encephalopathy, unspecified: Secondary | ICD-10-CM

## 2014-07-30 DIAGNOSIS — I4891 Unspecified atrial fibrillation: Secondary | ICD-10-CM | POA: Diagnosis present

## 2014-07-30 DIAGNOSIS — Z0000 Encounter for general adult medical examination without abnormal findings: Secondary | ICD-10-CM

## 2014-07-30 DIAGNOSIS — R1310 Dysphagia, unspecified: Secondary | ICD-10-CM

## 2014-07-30 DIAGNOSIS — R1312 Dysphagia, oropharyngeal phase: Secondary | ICD-10-CM | POA: Diagnosis present

## 2014-07-30 DIAGNOSIS — Z9089 Acquired absence of other organs: Secondary | ICD-10-CM

## 2014-07-30 DIAGNOSIS — F411 Generalized anxiety disorder: Secondary | ICD-10-CM | POA: Diagnosis present

## 2014-07-30 DIAGNOSIS — T17308A Unspecified foreign body in larynx causing other injury, initial encounter: Secondary | ICD-10-CM | POA: Diagnosis not present

## 2014-07-30 DIAGNOSIS — K5909 Other constipation: Secondary | ICD-10-CM

## 2014-07-30 DIAGNOSIS — C679 Malignant neoplasm of bladder, unspecified: Secondary | ICD-10-CM | POA: Diagnosis present

## 2014-07-30 DIAGNOSIS — R269 Unspecified abnormalities of gait and mobility: Secondary | ICD-10-CM

## 2014-07-30 DIAGNOSIS — R471 Dysarthria and anarthria: Secondary | ICD-10-CM | POA: Diagnosis present

## 2014-07-30 DIAGNOSIS — H109 Unspecified conjunctivitis: Secondary | ICD-10-CM | POA: Diagnosis present

## 2014-07-30 DIAGNOSIS — Z515 Encounter for palliative care: Secondary | ICD-10-CM

## 2014-07-30 DIAGNOSIS — G8190 Hemiplegia, unspecified affecting unspecified side: Secondary | ICD-10-CM | POA: Diagnosis present

## 2014-07-30 DIAGNOSIS — D649 Anemia, unspecified: Secondary | ICD-10-CM

## 2014-07-30 DIAGNOSIS — E785 Hyperlipidemia, unspecified: Secondary | ICD-10-CM

## 2014-07-30 DIAGNOSIS — M25569 Pain in unspecified knee: Secondary | ICD-10-CM | POA: Diagnosis present

## 2014-07-30 DIAGNOSIS — Z79899 Other long term (current) drug therapy: Secondary | ICD-10-CM | POA: Diagnosis not present

## 2014-07-30 DIAGNOSIS — Z7901 Long term (current) use of anticoagulants: Secondary | ICD-10-CM | POA: Diagnosis not present

## 2014-07-30 DIAGNOSIS — Z681 Body mass index (BMI) 19 or less, adult: Secondary | ICD-10-CM | POA: Diagnosis not present

## 2014-07-30 DIAGNOSIS — I69919 Unspecified symptoms and signs involving cognitive functions following unspecified cerebrovascular disease: Secondary | ICD-10-CM

## 2014-07-30 DIAGNOSIS — I10 Essential (primary) hypertension: Secondary | ICD-10-CM | POA: Diagnosis present

## 2014-07-30 DIAGNOSIS — J4489 Other specified chronic obstructive pulmonary disease: Secondary | ICD-10-CM

## 2014-07-30 DIAGNOSIS — Z85828 Personal history of other malignant neoplasm of skin: Secondary | ICD-10-CM | POA: Diagnosis not present

## 2014-07-30 DIAGNOSIS — N390 Urinary tract infection, site not specified: Secondary | ICD-10-CM | POA: Diagnosis present

## 2014-07-30 DIAGNOSIS — R29810 Facial weakness: Secondary | ICD-10-CM | POA: Diagnosis present

## 2014-07-30 DIAGNOSIS — D693 Immune thrombocytopenic purpura: Secondary | ICD-10-CM

## 2014-07-30 DIAGNOSIS — R64 Cachexia: Secondary | ICD-10-CM | POA: Diagnosis present

## 2014-07-30 DIAGNOSIS — I69319 Unspecified symptoms and signs involving cognitive functions following cerebral infarction: Secondary | ICD-10-CM

## 2014-07-30 DIAGNOSIS — J438 Other emphysema: Secondary | ICD-10-CM | POA: Diagnosis present

## 2014-07-30 DIAGNOSIS — R7303 Prediabetes: Secondary | ICD-10-CM

## 2014-07-30 DIAGNOSIS — J449 Chronic obstructive pulmonary disease, unspecified: Secondary | ICD-10-CM

## 2014-07-30 DIAGNOSIS — J690 Pneumonitis due to inhalation of food and vomit: Secondary | ICD-10-CM | POA: Diagnosis not present

## 2014-07-30 DIAGNOSIS — R319 Hematuria, unspecified: Secondary | ICD-10-CM

## 2014-07-30 DIAGNOSIS — Z8679 Personal history of other diseases of the circulatory system: Secondary | ICD-10-CM

## 2014-07-30 DIAGNOSIS — I69398 Other sequelae of cerebral infarction: Secondary | ICD-10-CM

## 2014-07-30 DIAGNOSIS — I482 Chronic atrial fibrillation, unspecified: Secondary | ICD-10-CM

## 2014-07-30 DIAGNOSIS — Y9210 Unspecified residential institution as the place of occurrence of the external cause: Secondary | ICD-10-CM | POA: Diagnosis not present

## 2014-07-30 DIAGNOSIS — I639 Cerebral infarction, unspecified: Secondary | ICD-10-CM

## 2014-07-30 DIAGNOSIS — R509 Fever, unspecified: Secondary | ICD-10-CM

## 2014-07-30 DIAGNOSIS — Z9081 Acquired absence of spleen: Secondary | ICD-10-CM

## 2014-07-30 DIAGNOSIS — IMO0002 Reserved for concepts with insufficient information to code with codable children: Secondary | ICD-10-CM | POA: Diagnosis not present

## 2014-07-30 DIAGNOSIS — J90 Pleural effusion, not elsewhere classified: Secondary | ICD-10-CM

## 2014-07-30 DIAGNOSIS — E43 Unspecified severe protein-calorie malnutrition: Secondary | ICD-10-CM

## 2014-07-30 DIAGNOSIS — I6381 Other cerebral infarction due to occlusion or stenosis of small artery: Secondary | ICD-10-CM

## 2014-07-30 DIAGNOSIS — D638 Anemia in other chronic diseases classified elsewhere: Secondary | ICD-10-CM | POA: Diagnosis present

## 2014-07-30 DIAGNOSIS — J111 Influenza due to unidentified influenza virus with other respiratory manifestations: Secondary | ICD-10-CM

## 2014-07-30 DIAGNOSIS — M1120 Other chondrocalcinosis, unspecified site: Secondary | ICD-10-CM

## 2014-07-30 DIAGNOSIS — M25562 Pain in left knee: Secondary | ICD-10-CM

## 2014-07-30 DIAGNOSIS — R351 Nocturia: Secondary | ICD-10-CM

## 2014-07-30 HISTORY — DX: Dysphagia, unspecified: R13.10

## 2014-07-30 LAB — CBC
HEMATOCRIT: 28.3 % — AB (ref 39.0–52.0)
HEMOGLOBIN: 9.6 g/dL — AB (ref 13.0–17.0)
MCH: 29.6 pg (ref 26.0–34.0)
MCHC: 33.9 g/dL (ref 30.0–36.0)
MCV: 87.3 fL (ref 78.0–100.0)
Platelets: 357 10*3/uL (ref 150–400)
RBC: 3.24 MIL/uL — AB (ref 4.22–5.81)
RDW: 13.9 % (ref 11.5–15.5)
WBC: 5.4 10*3/uL (ref 4.0–10.5)

## 2014-07-30 LAB — URINALYSIS, ROUTINE W REFLEX MICROSCOPIC
Glucose, UA: NEGATIVE mg/dL
Ketones, ur: 15 mg/dL — AB
NITRITE: POSITIVE — AB
Protein, ur: 100 mg/dL — AB
SPECIFIC GRAVITY, URINE: 1.019 (ref 1.005–1.030)
UROBILINOGEN UA: 1 mg/dL (ref 0.0–1.0)
pH: 6.5 (ref 5.0–8.0)

## 2014-07-30 LAB — GLUCOSE, CAPILLARY
GLUCOSE-CAPILLARY: 92 mg/dL (ref 70–99)
Glucose-Capillary: 88 mg/dL (ref 70–99)

## 2014-07-30 LAB — DIFFERENTIAL
Basophils Absolute: 0 10*3/uL (ref 0.0–0.1)
Basophils Relative: 1 % (ref 0–1)
Eosinophils Absolute: 0.1 10*3/uL (ref 0.0–0.7)
Eosinophils Relative: 1 % (ref 0–5)
LYMPHS ABS: 0.7 10*3/uL (ref 0.7–4.0)
Lymphocytes Relative: 13 % (ref 12–46)
MONO ABS: 0.6 10*3/uL (ref 0.1–1.0)
MONOS PCT: 12 % (ref 3–12)
NEUTROS PCT: 73 % (ref 43–77)
Neutro Abs: 4 10*3/uL (ref 1.7–7.7)

## 2014-07-30 LAB — COMPREHENSIVE METABOLIC PANEL
ALK PHOS: 95 U/L (ref 39–117)
ALT: 15 U/L (ref 0–53)
ANION GAP: 9 (ref 5–15)
AST: 20 U/L (ref 0–37)
Albumin: 2.8 g/dL — ABNORMAL LOW (ref 3.5–5.2)
BILIRUBIN TOTAL: 0.2 mg/dL — AB (ref 0.3–1.2)
BUN: 28 mg/dL — AB (ref 6–23)
CHLORIDE: 93 meq/L — AB (ref 96–112)
CO2: 30 mEq/L (ref 19–32)
CREATININE: 1.01 mg/dL (ref 0.50–1.35)
Calcium: 9 mg/dL (ref 8.4–10.5)
GFR calc non Af Amer: 66 mL/min — ABNORMAL LOW (ref >90)
GFR, EST AFRICAN AMERICAN: 77 mL/min — AB (ref >90)
GLUCOSE: 97 mg/dL (ref 70–99)
Potassium: 4.7 mEq/L (ref 3.7–5.3)
Sodium: 132 mEq/L — ABNORMAL LOW (ref 137–147)
Total Protein: 6.8 g/dL (ref 6.0–8.3)

## 2014-07-30 LAB — URINE MICROSCOPIC-ADD ON

## 2014-07-30 LAB — PROTIME-INR
INR: 2.13 — AB (ref 0.00–1.49)
Prothrombin Time: 23.8 seconds — ABNORMAL HIGH (ref 11.6–15.2)

## 2014-07-30 LAB — APTT: aPTT: 42 seconds — ABNORMAL HIGH (ref 24–37)

## 2014-07-30 LAB — RAPID URINE DRUG SCREEN, HOSP PERFORMED
Amphetamines: NOT DETECTED
BARBITURATES: NOT DETECTED
Benzodiazepines: NOT DETECTED
Cocaine: NOT DETECTED
Opiates: NOT DETECTED
Tetrahydrocannabinol: NOT DETECTED

## 2014-07-30 LAB — I-STAT TROPONIN, ED: Troponin i, poc: 0.01 ng/mL (ref 0.00–0.08)

## 2014-07-30 LAB — ETHANOL: Alcohol, Ethyl (B): <11 mg/dL (ref 0–11)

## 2014-07-30 MED ORDER — DILTIAZEM HCL ER COATED BEADS 180 MG PO CP24
180.0000 mg | ORAL_CAPSULE | Freq: Every day | ORAL | Status: DC
  Administered 2014-08-01 – 2014-08-05 (×4): 180 mg via ORAL
  Filled 2014-07-30 (×5): qty 1

## 2014-07-30 MED ORDER — SENNOSIDES-DOCUSATE SODIUM 8.6-50 MG PO TABS: 1.0000 | ORAL_TABLET | Freq: Every evening | ORAL | Status: DC | PRN

## 2014-07-30 MED ORDER — STROKE: EARLY STAGES OF RECOVERY BOOK
Freq: Once | Status: AC
  Administered 2014-07-30: 16:00:00
  Filled 2014-07-30: qty 1

## 2014-07-30 MED ORDER — GUAIFENESIN-DM 100-10 MG/5ML PO SYRP: 5.0000 mL | ORAL_SOLUTION | ORAL | Status: DC | PRN

## 2014-07-30 MED ORDER — VITAMIN D3 25 MCG (1000 UNIT) PO TABS
2000.0000 [IU] | ORAL_TABLET | Freq: Every day | ORAL | Status: DC
  Administered 2014-08-02 – 2014-08-05 (×4): 2000 [IU] via ORAL
  Filled 2014-07-30 (×10): qty 2

## 2014-07-30 MED ORDER — ONDANSETRON HCL 4 MG/2ML IJ SOLN: 4.0000 mg | Freq: Four times a day (QID) | INTRAMUSCULAR | Status: DC | PRN

## 2014-07-30 MED ORDER — MEROPENEM 1 G IV SOLR
1.0000 g | Freq: Two times a day (BID) | INTRAVENOUS | Status: DC
  Administered 2014-07-30 (×2): 1 g via INTRAVENOUS
  Filled 2014-07-30 (×4): qty 1

## 2014-07-30 MED ORDER — ONDANSETRON HCL 4 MG PO TABS: 4.0000 mg | ORAL_TABLET | Freq: Four times a day (QID) | ORAL | Status: DC | PRN

## 2014-07-30 MED ORDER — DEXTROSE 5 % IV SOLN
1.0000 g | Freq: Once | INTRAVENOUS | Status: DC
  Administered 2014-07-30: 1 g via INTRAVENOUS
  Filled 2014-07-30: qty 10

## 2014-07-30 MED ORDER — INSULIN ASPART 100 UNIT/ML ~~LOC~~ SOLN: 0.0000 [IU] | SUBCUTANEOUS | Status: DC

## 2014-07-30 MED ORDER — ASPIRIN 300 MG RE SUPP
300.0000 mg | Freq: Every day | RECTAL | Status: DC
  Administered 2014-07-30 – 2014-07-31 (×2): 300 mg via RECTAL
  Filled 2014-07-30 (×2): qty 1

## 2014-07-30 MED ORDER — DIGOXIN 125 MCG PO TABS
0.1250 mg | ORAL_TABLET | Freq: Every day | ORAL | Status: DC
  Administered 2014-08-01 – 2014-08-05 (×5): 0.125 mg via ORAL
  Filled 2014-07-30 (×5): qty 1

## 2014-07-30 MED ORDER — SODIUM CHLORIDE 0.9 % IJ SOLN
3.0000 mL | Freq: Two times a day (BID) | INTRAMUSCULAR | Status: DC
  Administered 2014-07-31 – 2014-08-04 (×3): 3 mL via INTRAVENOUS

## 2014-07-30 MED ORDER — RIVAROXABAN 15 MG PO TABS: 15.0000 mg | ORAL_TABLET | Freq: Every day | ORAL | Status: DC

## 2014-07-30 MED ORDER — AMIODARONE HCL 200 MG PO TABS
200.0000 mg | ORAL_TABLET | Freq: Two times a day (BID) | ORAL | Status: DC
  Administered 2014-07-31 – 2014-08-05 (×9): 200 mg via ORAL
  Filled 2014-07-30 (×9): qty 1

## 2014-07-30 MED ORDER — BACITRACIN-POLYMYXIN B 500-10000 UNIT/GM OP OINT
TOPICAL_OINTMENT | OPHTHALMIC | Status: DC
  Administered 2014-07-30: 18:00:00 via OPHTHALMIC
  Administered 2014-07-30: 1 via OPHTHALMIC
  Administered 2014-07-31 (×2): via OPHTHALMIC
  Administered 2014-07-31: 1 via OPHTHALMIC
  Administered 2014-07-31 – 2014-08-01 (×5): via OPHTHALMIC
  Administered 2014-08-01: 1 via OPHTHALMIC
  Administered 2014-08-01: 12:00:00 via OPHTHALMIC
  Administered 2014-08-02: 1 via OPHTHALMIC
  Administered 2014-08-02: 11:00:00 via OPHTHALMIC
  Administered 2014-08-02: 1 via OPHTHALMIC
  Administered 2014-08-02: 17:00:00 via OPHTHALMIC
  Administered 2014-08-02: 1 via OPHTHALMIC
  Administered 2014-08-03: 11:00:00 via OPHTHALMIC
  Administered 2014-08-03 (×2): 1 via OPHTHALMIC
  Administered 2014-08-03: 16:00:00 via OPHTHALMIC
  Administered 2014-08-03 – 2014-08-04 (×3): 1 via OPHTHALMIC
  Administered 2014-08-04: 18:00:00 via OPHTHALMIC
  Administered 2014-08-04 (×2): 1 via OPHTHALMIC
  Administered 2014-08-04 – 2014-08-05 (×2): via OPHTHALMIC
  Administered 2014-08-05: 1 via OPHTHALMIC
  Administered 2014-08-05 (×2): via OPHTHALMIC
  Administered 2014-08-05: 1 via OPHTHALMIC
  Filled 2014-07-30: qty 3.5

## 2014-07-30 MED ORDER — DOCUSATE SODIUM 100 MG PO CAPS
200.0000 mg | ORAL_CAPSULE | Freq: Two times a day (BID) | ORAL | Status: DC
  Filled 2014-07-30 (×3): qty 2

## 2014-07-30 MED ORDER — SODIUM CHLORIDE 0.9 % IV SOLN
INTRAVENOUS | Status: DC
  Administered 2014-07-30: 50 mL via INTRAVENOUS
  Administered 2014-07-31: 50 mL/h via INTRAVENOUS

## 2014-07-30 MED ORDER — PRO-STAT SUGAR FREE PO LIQD
30.0000 mL | Freq: Three times a day (TID) | ORAL | Status: DC
  Filled 2014-07-30 (×5): qty 30

## 2014-07-30 NOTE — ED Notes (Signed)
CT called regarding lack of report crossing over into chart.

## 2014-07-30 NOTE — ED Notes (Signed)
[name], [place] with neurology at the bedside.

## 2014-07-30 NOTE — ED Notes (Signed)
PT patient's family and RN; pt does seem more alert than previously. Still difficulty speaking. Family remains at bedside.

## 2014-07-30 NOTE — ED Notes (Signed)
Bladder scan performed per admitting MD; 37 ml found in bladder. MD aware.

## 2014-07-30 NOTE — ED Notes (Signed)
Admitting at bedside 

## 2014-07-30 NOTE — Progress Notes (Signed)
SLP Cancellation Note  Patient Details Name: [name] MRN: [id number] DOB: September 17, 1927   Cancelled treatment:       Reason Eval/Treat Not Completed: Patient at procedure or test/unavailable. Pt off the unit for MRI. Will return for bedside swallow evaluation.    Germain Osgood, M.A. CCC-SLP [phone]  Germain Osgood 07/30/2014, 4:33 PM

## 2014-07-30 NOTE — ED Notes (Signed)
Wife at bedside reports when she left him at [facility] at 20:30 last night he was baseline. Had stroke in January that left him weak on right side. Was rehabing there. Was seen here on Saturday for UTI and heart issues. Pt was sent back to [facility] and reportedly making progress per wife. Pt states when she got there this morning at 0730, he was having stroke like symptoms.

## 2014-07-30 NOTE — ED Notes (Signed)
PT LSN 20:30 07/29/14. Wife came in this morning to check on him and found his speech slurred, and trouble getting words out, facial droop left side, drift on left side arm and leg. PT on xerelto due to afib. Blood present in urine with condom cath present on arrival. Reportedly baseline.

## 2014-07-30 NOTE — ED Provider Notes (Signed)
CSN: [id number]     Arrival date & time 07/30/14  0914 History   First MD Initiated Contact with Patient 07/30/14 [phone]     No chief complaint on file.    (Consider location/radiation/quality/duration/timing/severity/associated sxs/prior Treatment) HPI Comments: 80 year old male presenting with left-sided weakness and left-sided facial droop. Also has some associated slurred speech. Unknown exact symptom onset time, last seen normal last night.  Patient is a 80 y.o. male presenting with neurologic complaint.  Neurologic Problem This is a new problem. Episode onset: last seen normal last night before bed. The problem occurs constantly. The problem has not changed since onset.Pertinent negatives include no chest pain, no headaches and no shortness of breath. Nothing aggravates the symptoms. Nothing relieves the symptoms. He has tried nothing for the symptoms.    Past Medical History  Diagnosis Date  . Bladder cancer     a. Dr. Lawerance Bach - [facility]. Multiple surgeries, chemo trial.  . Hyperlipidemia   . Anemia 04/10/2009  . Immune thrombocytopenic purpura     a. s/p splenectomy.  Marland Kitchen Anxiety   . Hypertension   . Permanent atrial fibrillation     a. Historically difficult to control rates  . COPD   . TIA (transient ischemic attack)     a. H/o TIA 2007.  . Stroke     a. H/o remote lacunar infarcts, at least 3 of them. b. Right lenticular nucleus and corona radiata infarct, cardioembolic 12/4429. c. Stroke 11/2013, new infarct 2 weeks later 12/2013.  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 Gross hematuria     a. H/o bladder CA with intermittent gross hematuria.  Marland Kitchen Dysphagia   . Anemia    Past Surgical History  Procedure Laterality Date  . Splenectomy    . Bilateral vats ablation    . Facial cancer      facial skin cancer   Family History  Problem Relation Age of Onset  . Heart disease Mother   .  Arthritis Father    History  Substance Use Topics  . Smoking status: Never Smoker   . Smokeless tobacco: Never Used  . Alcohol Use: No    Review of Systems  Respiratory: Negative for shortness of breath.   Cardiovascular: Negative for chest pain.  Neurological: Negative for headaches.  All other systems reviewed and are negative.     Allergies  Azithromycin; Diazepam; Ezetimibe-simvastatin; Morphine; Prednisone; and Sulfonamide derivatives  Home Medications   Prior to Admission medications   Medication Sig Start Date End Date Taking? Authorizing Provider  amiodarone (PACERONE) 200 MG tablet Take 1 tablet (200 mg total) by mouth 2 (two) times daily. Present dose for 2 weeks then 200 mg daily after 2 weeks. 07/26/14   Thurnell Lose, MD  bacitracin-polymyxin b (POLYSPORIN) ophthalmic ointment Place into both eyes every 4 (four) hours. apply to eye every 12 hours while awake 07/26/14   Thurnell Lose, MD  bisacodyl (DULCOLAX) 10 MG suppository Place 1 suppository (10 mg total) rectally daily as needed for moderate constipation. 07/26/14   Thurnell Lose, MD  Cholecalciferol (VITAMIN D) 2000 UNITS tablet Take 2,000 Units by mouth daily.     Historical Provider, MD  diclofenac sodium (VOLTAREN) 1 % GEL Apply 2 g topically every morning.    Historical Provider, MD  digoxin (LANOXIN) 0.125 MG tablet Take 1 tablet (0.125 mg total) by mouth daily. 07/23/14   Annie Main  Dierdre Highman, MD  diltiazem (CARDIZEM CD) 180 MG 24 hr capsule Take 1 capsule (180 mg total) by mouth daily. 07/23/14   Donne Hazel, MD  docusate sodium 100 MG CAPS Take 200 mg by mouth 2 (two) times daily. 07/26/14   Thurnell Lose, MD  Rivaroxaban (XARELTO) 15 MG TABS tablet Take 15 mg by mouth daily.    Historical Provider, MD   BP 117/55  Temp(Src) 98 F (36.7 C) (Oral)  Resp 22  SpO2 99% Physical Exam  Nursing note and vitals reviewed. Constitutional: He is oriented to person, place, and time. He appears  well-developed and well-nourished. No distress.  HENT:  Head: Normocephalic and atraumatic.  Mouth/Throat: Oropharynx is clear and moist.  Eyes: Conjunctivae are normal. Pupils are equal, round, and reactive to light. No scleral icterus.  Neck: Neck supple.  Cardiovascular: Normal rate, normal heart sounds and intact distal pulses.  An irregularly irregular rhythm present.  No murmur heard. Pulmonary/Chest: Effort normal and breath sounds normal. No stridor. No respiratory distress. He has no wheezes. He has no rales.  Abdominal: Soft. He exhibits no distension. There is no tenderness.  Musculoskeletal: Normal range of motion. He exhibits no edema.  Neurological: He is alert and oriented to person, place, and time. A cranial nerve deficit (left sided facial drooping.  Dysarthria.  ) is present.  Dense hemiparesis on left.    Skin: Skin is warm and dry. No rash noted.  Psychiatric: He has a normal mood and affect. His behavior is normal.    ED Course  Procedures (including critical care time) Labs Review Labs Reviewed  PROTIME-INR - Abnormal; Notable for the following:    Prothrombin Time 23.8 (*)    INR 2.13 (*)    All other components within normal limits  APTT - Abnormal; Notable for the following:    aPTT 42 (*)    All other components within normal limits  CBC - Abnormal; Notable for the following:    RBC 3.24 (*)    Hemoglobin 9.6 (*)    HCT 28.3 (*)    All other components within normal limits  COMPREHENSIVE METABOLIC PANEL - Abnormal; Notable for the following:    Sodium 132 (*)    Chloride 93 (*)    BUN 28 (*)    Albumin 2.8 (*)    Total Bilirubin 0.2 (*)    GFR calc non Af Amer 66 (*)    GFR calc Af Amer 77 (*)    All other components within normal limits  URINALYSIS, ROUTINE W REFLEX MICROSCOPIC - Abnormal; Notable for the following:    Color, Urine RED (*)    APPearance TURBID (*)    Hgb urine dipstick LARGE (*)    Bilirubin Urine LARGE (*)    Ketones, ur 15  (*)    Protein, ur 100 (*)    Nitrite POSITIVE (*)    Leukocytes, UA LARGE (*)    All other components within normal limits  URINE MICROSCOPIC-ADD ON - Abnormal; Notable for the following:    Bacteria, UA FEW (*)    All other components within normal limits  URINE CULTURE  ETHANOL  DIFFERENTIAL  URINE RAPID DRUG SCREEN (HOSP PERFORMED)  I-STAT TROPOININ, ED    Imaging Review Ct Head Wo Contrast  07/30/2014   CLINICAL DATA:  Slurred speech  EXAM: CT HEAD WITHOUT CONTRAST  TECHNIQUE: Contiguous axial images were obtained from the base of the skull through the vertex without intravenous contrast.  COMPARISON:  07/25/2014  FINDINGS: The bony calvarium is intact. Atrophic changes are noted. A stable left occipital infarct is noted when compared with the prior exam. Scattered lacunar infarcts are noted within the deep white matter bilaterally and also stable. Chronic white matter ischemic changes noted. No acute hemorrhage, acute infarction or space-occupying mass lesion is identified.  IMPRESSION: Chronic changes stable from recent MRI examination. No acute abnormality is noted.   Electronically Signed   By: Inez Catalina M.D.   On: 07/30/2014 10:00  All radiology studies independently viewed by me.      EKG Interpretation   Date/Time:  Tuesday July 30 2014 09:27:25 EDT Ventricular Rate:  64 PR Interval:  39 QRS Duration: 99 QT Interval:  551 QTC Calculation: 569 R Axis:   96 Text Interpretation:  Atrial fibrillation Right axis deviation Nonspecific  repol abnormality, diffuse leads Prolonged QT interval nonspecific ST/T  changes new compared to prior. Confirmed by [facility]  MD, TREY ([id number]) on  07/30/2014 10:36:04 AM      MDM   Final diagnoses:  Acute CVA (cerebrovascular accident)  Anemia, unspecified  Anxiety state, unspecified  Atrial fibrillation, unspecified  Cognitive deficit status post cerebrovascular accident  Cerebral infarction due to other mechanism  Long term  current use of anticoagulant  Protein-calorie malnutrition, severe  Unspecified essential hypertension  UTI (lower urinary tract infection)    80 yo male presenting with stroke symptoms.  Last seen normal yesterday and on xarelto, so no code stroke called.  CT negative for hemorrhage.  Consulted neurology.  Dr. Candiss Norse ([facility]) will admit.    Pt also has UTI.  Given Rocephin.  Cultures pending.   Houston Siren III, MD 07/30/14 [phone]

## 2014-07-30 NOTE — Progress Notes (Signed)
Pharmacy consulted to dose heparin in pt with a new small, acute infarct primarily involving the R corona radiata b/c she is unable to take her home xarelto for Afib due to NPO status.   D/w Dr. Doy Mince of neurology: she recs no heparin, aspirin 300 mg PR until able to take PO and then change back to Xarelto. Dr. Candiss Norse accepted recs of neuro. Plan: aspirin 300 mg PR until able to take po, then resume xarelto 15 mg daily  Eudelia Bunch, Pharm.D. [phone] 07/30/2014 7:11 PM

## 2014-07-30 NOTE — ED Notes (Signed)
Neuro consulting

## 2014-07-30 NOTE — H&P (Signed)
Patient Demographics  [name], is a 80 y.o. male  MRN: [id number]   DOB - 02-05-1927  Admit Date - 07/30/2014  Outpatient Primary MD for the patient is Cathlean Cower, MD   With History of -  Past Medical History  Diagnosis Date  . Bladder cancer     a. Dr. Lawerance Bach - [facility]. Multiple surgeries, chemo trial.  . Hyperlipidemia   . Anemia 04/10/2009  . Immune thrombocytopenic purpura     a. s/p splenectomy.  Marland Kitchen Anxiety   . Hypertension   . Permanent atrial fibrillation     a. Historically difficult to control rates  . COPD   . TIA (transient ischemic attack)     a. H/o TIA 2007.  . Stroke     a. H/o remote lacunar infarcts, at least 3 of them. b. Right lenticular nucleus and corona radiata infarct, cardioembolic 02/8881. c. Stroke 11/2013, new infarct 2 weeks later 12/2013.  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 Gross hematuria     a. H/o bladder CA with intermittent gross hematuria.  Marland Kitchen Dysphagia   . Anemia       Past Surgical History  Procedure Laterality Date  . Splenectomy    . Bilateral vats ablation    . Facial cancer      facial skin cancer    in for   L sided weakness  HPI  [name]  is a 80 y.o. male, with H/O multiple bilateral CVAs, at baseline left side stronger than right, atrial fibrillation on xaralto, hypertension, COPD, UTI, chronic intermittent hematuria, who was recently discharged to a nursing home from this facility after a UTI related admission comes from nursing home with dense left-sided weakness which is new.  Exact time of onset unclear, he is on xaralto and has not a TPA candidate, denies any headache, no fever chills, no headache, no new problem hearing, no chest pain cough phlegm  shortness of breath, no abdominal pain, he does have intermittent hematuria, no blood in stool, left-sided weakness as above.    Review of Systems    In addition to the HPI above,   No Fever-chills, No Headache, No changes with Vision or hearing, No problems swallowing food or Liquids, No Chest pain, Cough or Shortness of Breath, No Abdominal pain, No Nausea or Vommitting, Bowel movements are regular, No Blood in stool or Urine, No dysuria, No new skin rashes or bruises, No new joints pains-aches,  L sided dense weakness No recent weight gain or loss, No polyuria, polydypsia or polyphagia, No significant Mental Stressors.  A full 10 point Review of Systems was done, except as stated above, all other Review of Systems were negative.   Social History History  Substance Use Topics  . Smoking status: Never Smoker   . Smokeless tobacco: Never Used  . Alcohol Use: No  Family History Family History  Problem Relation Age of Onset  . Heart disease Mother   . Arthritis Father       Prior to Admission medications   Medication Sig Start Date End Date Taking? Authorizing Provider  amiodarone (PACERONE) 200 MG tablet Take 1 tablet (200 mg total) by mouth 2 (two) times daily. Present dose for 2 weeks then 200 mg daily after 2 weeks. 07/26/14  Yes Thurnell Lose, MD  bacitracin-polymyxin b (POLYSPORIN) ophthalmic ointment Place into both eyes every 4 (four) hours. apply to eye every 12 hours while awake 07/26/14  Yes Thurnell Lose, MD  Cholecalciferol (VITAMIN D) 2000 UNITS tablet Take 2,000 Units by mouth daily.    Yes Historical Provider, MD  digoxin (LANOXIN) 0.125 MG tablet Take 1 tablet (0.125 mg total) by mouth daily. 07/23/14  Yes Donne Hazel, MD  diltiazem (CARDIZEM CD) 180 MG 24 hr capsule Take 1 capsule (180 mg total) by mouth daily. 07/23/14  Yes Donne Hazel, MD  docusate sodium 100 MG CAPS Take 200 mg by mouth 2 (two) times daily. 07/26/14  Yes Thurnell Lose,  MD  Rivaroxaban (XARELTO) 15 MG TABS tablet Take 15 mg by mouth daily.   Yes Historical Provider, MD    Allergies  Allergen Reactions  . Azithromycin Other (See Comments)    Altered mental status  . Diazepam Other (See Comments)    REACTION: agitation  . Ezetimibe-Simvastatin Other (See Comments)    unknown  . Morphine Other (See Comments)    Hallucinations.  . Prednisone Other (See Comments)    Dizziness  . Sulfonamide Derivatives Hives    Physical Exam  Vitals  Blood pressure 138/57, pulse 72, temperature 98 F (36.7 C), temperature source Oral, resp. rate 15, SpO2 98.00%.   1. General frail elderly white male lying in bed in NAD,     2. Normal affect and insight, Not Suicidal or Homicidal, Awake Alert, Oriented X 3. L Facial droop  3. No F.N deficits, ALL C.Nerves Intact, Strength 0/5 L side 4/5 right side, Sensation intact all 4 extremities, Plantars down going.  4. Ears and Eyes appear Normal, Conjunctivae clear, PERRLA. Moist Oral Mucosa.  5. Supple Neck, No JVD, No cervical lymphadenopathy appriciated, No Carotid Bruits.  6. Symmetrical Chest wall movement, Good air movement bilaterally, CTAB.  7. RRR, No Gallops, Rubs or Murmurs, No Parasternal Heave.  8. Positive Bowel Sounds, Abdomen Soft, No tenderness, No organomegaly appriciated,No rebound -guarding or rigidity. Condom Catheter.  9.  No Cyanosis, Normal Skin Turgor, No Skin Rash or Bruise.  10. Good muscle tone,  joints appear normal , no effusions, Normal ROM.  11. No Palpable Lymph Nodes in Neck or Axillae     Data Review  CBC  Recent Labs Lab 07/24/14 0430 07/30/14 0954  WBC 7.1 5.4  HGB 10.0* 9.6*  HCT 30.1* 28.3*  PLT 251 357  MCV 88.5 87.3  MCH 29.4 29.6  MCHC 33.2 33.9  RDW 14.3 13.9  LYMPHSABS  --  0.7  MONOABS  --  0.6  EOSABS  --  0.1  BASOSABS  --  0.0    ------------------------------------------------------------------------------------------------------------------  Chemistries   Recent Labs Lab 07/24/14 0430 07/26/14 0700 07/30/14 0954  NA 131*  --  132*  K 4.6  --  4.7  CL 91*  --  93*  CO2 28  --  30  GLUCOSE 100*  --  97  BUN 22  --  28*  CREATININE  0.90  --  1.01  CALCIUM 8.7  --  9.0  MG  --  1.9  --   AST  --   --  20  ALT  --   --  15  ALKPHOS  --   --  95  BILITOT  --   --  0.2*   ------------------------------------------------------------------------------------------------------------------ CrCl is unknown because both a height and weight (above a minimum accepted value) are required for this calculation. ------------------------------------------------------------------------------------------------------------------ No results found for this basename: TSH, T4TOTAL, FREET3, T3FREE, THYROIDAB,  in the last 72 hours   Coagulation profile  Recent Labs Lab 07/30/14 0954  INR 2.13*   ------------------------------------------------------------------------------------------------------------------- No results found for this basename: DDIMER,  in the last 72 hours -------------------------------------------------------------------------------------------------------------------  Cardiac Enzymes No results found for this basename: CK, CKMB, TROPONINI, MYOGLOBIN,  in the last 168 hours ------------------------------------------------------------------------------------------------------------------ No components found with this basename: POCBNP,    ---------------------------------------------------------------------------------------------------------------  Urinalysis    Component Value Date/Time   COLORURINE RED* 07/30/2014 1045   APPEARANCEUR TURBID* 07/30/2014 1045   LABSPEC 1.019 07/30/2014 1045   PHURINE 6.5 07/30/2014 [place] 07/30/2014 [place] 03/28/2014 1645   HGBUR  LARGE* 07/30/2014 1045   BILIRUBINUR LARGE* 07/30/2014 1045   BILIRUBINUR negative 03/06/2013 1558   KETONESUR 15* 07/30/2014 1045   PROTEINUR 100* 07/30/2014 1045   PROTEINUR + (positive) 03/06/2013 1558   UROBILINOGEN 1.0 07/30/2014 1045   UROBILINOGEN 0.2 03/06/2013 1558   NITRITE POSITIVE* 07/30/2014 1045   NITRITE negative 03/06/2013 1558   LEUKOCYTESUR LARGE* 07/30/2014 1045    ----------------------------------------------------------------------------------------------------------------  Imaging results:   Ct Head Wo Contrast  07/30/2014   CLINICAL DATA:  Slurred speech  EXAM: CT HEAD WITHOUT CONTRAST  TECHNIQUE: Contiguous axial images were obtained from the base of the skull through the vertex without intravenous contrast.  COMPARISON:  07/25/2014  FINDINGS: The bony calvarium is intact. Atrophic changes are noted. A stable left occipital infarct is noted when compared with the prior exam. Scattered lacunar infarcts are noted within the deep white matter bilaterally and also stable. Chronic white matter ischemic changes noted. No acute hemorrhage, acute infarction or space-occupying mass lesion is identified.   IMPRESSION: Chronic changes stable from recent MRI examination. No acute abnormality is noted.   Electronically Signed   By: Inez Catalina M.D.   On: 07/30/2014 10:00      My personal review of EKG: Rhythm Afib, Rate  63 /min, non specific ST changes    Assessment & Plan   1. Left-sided weakness suspicious for right MCA territory CVA ischemic CVA. He has atrial fibrillation and is already on xaralto for secondary prevention. Has been seen by neurology, will do full stroke workup which will include MRI/MRA brain, echogram carotid, A1c lipid panel, will be evaluated by PT OT and speech. We'll defer to stroke team to decide if we have to continue xaralto or switch to a different agent.   2. Atrial fibrillation. Goal rate controlled, currently on combination of amiodarone, digoxin,  Cardizem and xaralto continue.   3. History of recurrent UTIs. UA appears dirty again, was recently treated with Rocephin and Ceftin, will place him on meropenem for now follow urine cultures.   4. Recurrent hematuria. Chronic problem family does not want to hold anticoagulation, post void residual was less than 50 cc in the ER.   5. Anemia of chronic disease. Stable no acute issues.   6. Suspected conjunctivitis. Finishing antibiotic eye ointment treatment, clinically much  improved no signs of active conjunctivitis at this time.   7. History of COPD. Stable no wheezing, oxygen and nebulizer treatment as needed.    8. Severe protein calorie malnutrition. Place on protein supplementation.       DVT Prophylaxis Xaralto & - SCDs   AM Labs Ordered, also please review Full Orders  Family Communication: Admission, patients condition and plan of care including tests being ordered have been discussed with the patient and wife who indicate understanding and agree with the plan and Code Status.  Code Status DNR  Likely DC to  SNF  Condition GUARDED     Time spent in minutes : 35    Hassaan Crite K M.D on 07/30/2014 at 2:20 PM  Between 7am to 7pm - Pager - [phone]  After 7pm go to [email or web address] - password [email or web address]  And look for the night coverage person covering me after hours  [facility] Office  [phone]   **Disclaimer: This note may have been dictated with voice recognition software. Similar sounding words can inadvertently be transcribed and this note may contain transcription errors which may not have been corrected upon publication of note.**

## 2014-07-30 NOTE — Progress Notes (Signed)
ANTIBIOTIC CONSULT NOTE - INITIAL  Pharmacy Consult for meropenem Indication: UTI  Allergies  Allergen Reactions  . Azithromycin Other (See Comments)    Altered mental status  . Diazepam Other (See Comments)    REACTION: agitation  . Ezetimibe-Simvastatin Other (See Comments)    unknown  . Morphine Other (See Comments)    Hallucinations.  . Prednisone Other (See Comments)    Dizziness  . Sulfonamide Derivatives Hives    Patient Measurements:     Vital Signs: Temp: 98 F (36.7 C) (08/04 0931) Temp src: Oral (08/04 0931) BP: 138/57 mmHg (08/04 1348) Pulse Rate: 72 (08/04 1330) Intake/Output from previous day:   Intake/Output from this shift:    Labs:  Recent Labs  07/30/14 0954  WBC 5.4  HGB 9.6*  PLT 357  CREATININE 1.01   The CrCl is unknown because both a height and weight (above a minimum accepted value) are required for this calculation. No results found for this basename: [name], VANCORANDOM, GENTTROUGH, GENTPEAK, GENTRANDOM, TOBRATROUGH, TOBRAPEAK, TOBRARND, AMIKACINPEAK, AMIKACINTROU, AMIKACIN,  in the last 72 hours   Microbiology: Recent Results (from the past 720 hour(s))  URINE CULTURE     Status: None   Collection Time    07/20/14 12:37 PM      Result Value Ref Range Status   Specimen Description URINE, CLEAN CATCH   Final   Special Requests NONE   Final   Culture  Setup Time     Final   Value: 07/20/2014 19:21     Performed at [place]     Final   Value: 45,000 COLONIES/ML     Performed at [facility]   Culture     Final   Value: Multiple bacterial morphotypes present, none predominant. Suggest appropriate recollection if clinically indicated.     Performed at [facility]   Report Status 07/21/2014 FINAL   Final  MRSA PCR SCREENING     Status: Abnormal   Collection Time    07/20/14  5:17 PM      Result Value Ref Range Status   MRSA by PCR POSITIVE (*) NEGATIVE Final    Comment:            The GeneXpert MRSA Assay (FDA     approved for NASAL specimens     only), is one component of a     comprehensive MRSA colonization     surveillance program. It is not     intended to diagnose MRSA     infection nor to guide or     monitor treatment for     MRSA infections.     RESULT CALLED TO, READ BACK BY AND VERIFIED WITH:     Leonard Gash RN AT 1909 07/20/14 BY [facility]    Medical History: Past Medical History  Diagnosis Date  . Bladder cancer     a. Dr. Lawerance [name] - [facility]. Multiple surgeries, chemo trial.  . Hyperlipidemia   . Anemia 04/10/2009  . Immune thrombocytopenic purpura     a. s/p splenectomy.  Marland Kitchen Anxiety   . Hypertension   . Permanent atrial fibrillation     a. Historically difficult to control rates  . COPD   . TIA (transient ischemic attack)     a. H/o TIA 2007.  . Stroke     a. H/o remote lacunar infarcts, at least 3 of them. b. Right lenticular nucleus and corona radiata infarct, cardioembolic 08/5283. c. Stroke  11/2013, new infarct 2 weeks later 12/2013.  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 Gross hematuria     a. H/o bladder CA with intermittent gross hematuria.  Marland Kitchen Dysphagia   . Anemia     Assessment: 22 YOM recently discharged, seen here with concerns for stroke. Patient had stroke in January with resulting weakness, continues on Xarelto and statin. Outside of window and on Xarelto, therefore not tPA candidate. Has hx of bladder cancer with chronic hematuria (family wishes to continue Xarelto). During last admission, he received ceftriaxone x1 and then was switched to Ceftin- completed. Received 1 dose of ceftriaxone today in the ED. WBC 5.4, afebrile. SCr 1.01, est CrCl ~36mL/min. A urine culture has been sent.  Goal of Therapy:  Eradication of infection  Plan:  1. Meropenem 1g IV q12h 2. Follow up c/s and ability to de-escalate  antibiotics 3. Follow renal function  [name] D. [name], PharmD, BCPS Clinical Pharmacist Pager: [phone] 07/30/2014 2:24 PM

## 2014-07-30 NOTE — Progress Notes (Signed)
Patient admitted to the unit from ER. Patient lethargic, easily arousable, speech very slurred and almost incomprehensible. Family at bedside. Family oriented to the room.Will continue to monitor.

## 2014-07-30 NOTE — Consult Note (Addendum)
Referring Physician: Doy Mince    Chief Complaint: Stroke  HPI:                                                                                                                                         [name] is an 80 y.o. male who was recently hospitalized for UTI and generalized weakness.  Patient was discharged to rehab facility and was doing well. He was last seen in the evening at 2030 hours by his wife.  This am he was found very lethargic and dysarthric with the inability to move his left arm and leg. He was brought to ED where initial CT head was negative for acute blood.  Neurology was called for recommendations.   Date last known well: Date: 07/29/2014 Time last known well: Time: 20:30 tPA Given: No: on Xeralto, outside time window  Past Medical History  Diagnosis Date  . Bladder cancer     a. Dr. Lawerance Bach - [facility] ([facility]). Multiple surgeries, chemo trial.  . Hyperlipidemia   . Anemia 04/10/2009  . Immune thrombocytopenic purpura     a. s/p splenectomy.  Marland Kitchen Anxiety   . Hypertension   . Permanent atrial fibrillation     a. Historically difficult to control rates  . COPD   . TIA (transient ischemic attack)     a. H/o TIA 2007.  . Stroke     a. H/o remote lacunar infarcts, at least 3 of them. b. Right lenticular nucleus and corona radiata infarct, cardioembolic 05/8126. c. Stroke 11/2013, new infarct 2 weeks later 12/2013.  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 Gross hematuria     a. H/o bladder CA with intermittent gross hematuria.  Marland Kitchen Dysphagia   . Anemia     Past Surgical History  Procedure Laterality Date  . Splenectomy    . Bilateral vats ablation    . Facial cancer      facial skin cancer    Family History  Problem Relation Age of Onset  . Heart disease Mother   . Arthritis Father    Social History:  reports that he has never smoked. He has never used smokeless tobacco. He reports  that he does not drink alcohol or use illicit drugs.  Allergies:  Allergies  Allergen Reactions  . Azithromycin Other (See Comments)    Altered mental status  . Diazepam Other (See Comments)    REACTION: agitation  . Ezetimibe-Simvastatin Other (See Comments)    unknown  . Morphine Other (See Comments)    Hallucinations.  . Prednisone Other (See Comments)    Dizziness  . Sulfonamide Derivatives Hives    Medications:  Current Facility-Administered Medications  Medication Dose Route Frequency Provider Last Rate Last Dose  . cefTRIAXone (ROCEPHIN) 1 g in dextrose 5 % 50 mL IVPB  1 g Intravenous Once Houston Siren III, MD       Current Outpatient Prescriptions  Medication Sig Dispense Refill  . amiodarone (PACERONE) 200 MG tablet Take 1 tablet (200 mg total) by mouth 2 (two) times daily. Present dose for 2 weeks then 200 mg daily after 2 weeks.      . bacitracin-polymyxin b (POLYSPORIN) ophthalmic ointment Place into both eyes every 4 (four) hours. apply to eye every 12 hours while awake  3.5 g  0  . Cholecalciferol (VITAMIN D) 2000 UNITS tablet Take 2,000 Units by mouth daily.       . digoxin (LANOXIN) 0.125 MG tablet Take 1 tablet (0.125 mg total) by mouth daily.  30 tablet  0  . diltiazem (CARDIZEM CD) 180 MG 24 hr capsule Take 1 capsule (180 mg total) by mouth daily.  30 capsule  0  . docusate sodium 100 MG CAPS Take 200 mg by mouth 2 (two) times daily.  10 capsule  0  . Rivaroxaban (XARELTO) 15 MG TABS tablet Take 15 mg by mouth daily.         ROS:                                                                                                                                       History obtained from wife  General ROS: negative for - chills, fatigue, fever, night sweats, weight gain or weight loss Psychological ROS: negative for - behavioral disorder,  hallucinations, memory difficulties, mood swings or suicidal ideation Ophthalmic ROS: negative for - blurry vision, double vision, eye pain or loss of vision ENT ROS: negative for - epistaxis, nasal discharge, oral lesions, sore throat, tinnitus or vertigo Allergy and Immunology ROS: negative for - hives or itchy/watery eyes Hematological and Lymphatic ROS: negative for - bleeding problems, bruising or swollen lymph nodes Endocrine ROS: negative for - galactorrhea, hair pattern changes, polydipsia/polyuria or temperature intolerance Respiratory ROS: negative for - cough, hemoptysis, shortness of breath or wheezing Cardiovascular ROS: negative for - chest pain, dyspnea on exertion, edema or irregular heartbeat Gastrointestinal ROS: negative for - abdominal pain, diarrhea, hematemesis, nausea/vomiting or stool incontinence Genito-Urinary ROS: negative for - dysuria, hematuria, incontinence or urinary frequency/urgency Musculoskeletal ROS: negative for - joint swelling or muscular weakness Neurological ROS: as noted in HPI Dermatological ROS: negative for rash and skin lesion changes  Neurologic Examination:  Blood pressure 124/49, pulse 74, temperature 98 F (36.7 C), temperature source Oral, resp. rate 19, SpO2 100.00%.   General: NAD Mental Status: Alert, oriented to [place] [facility] and month.  Speech dysarthric and hypophonic (the hypophonia is not new) with preserved ability to name objects, follow commands, repeat words and read sentences.   Cranial Nerves: II: Discs flat bilaterally; Visual fields shows a left field cut, pupils equal, round, reactive to light and accommodation III,IV, VI: ptosis not present, extra-ocular motions intact bilaterally V,VII: left facial droop, facial light touch sensation stated to be intact VIII: hearing normal bilaterally IX,X: gag reflex present XI:  bilateral shoulder shrug XII: midline tongue extension   Motor: Right : Upper extremity   5/5    Left:     Upper extremity   0/5  Lower extremity   5/5     Lower extremity   0/5 --triple reflex Tone and bulk:normal tone throughout; no atrophy noted Sensory: Pinprick and light touch intact throughout, bilaterally Deep Tendon Reflexes:  Right: Upper Extremity   Left: Upper extremity   biceps (C-5 to C-6) 2/4   biceps (C-5 to C-6) 2/4 tricep (C7) 2/4    triceps (C7) 2/4 Brachioradialis (C6) 2/4  Brachioradialis (C6) 2/4  Lower Extremity Lower Extremity  quadriceps (L-2 to L-4) 2/4   quadriceps (L-2 to L-4) 2/4 Achilles (S1) 0/4   Achilles (S1) 0/4  Plantars: Right: downgoing   Left: up going Cerebellar: normal finger-to-nose on the right,  normal heel-to-shin test on the right Gait: not tested CV: pulses palpable throughout    Lab Results: Basic Metabolic Panel:  Recent Labs Lab 07/24/14 0430 07/26/14 0700 07/30/14 0954  NA 131*  --  132*  K 4.6  --  4.7  CL 91*  --  93*  CO2 28  --  30  GLUCOSE 100*  --  97  BUN 22  --  28*  CREATININE 0.90  --  1.01  CALCIUM 8.7  --  9.0  MG  --  1.9  --     Liver Function Tests:  Recent Labs Lab 07/30/14 0954  AST 20  ALT 15  ALKPHOS 95  BILITOT 0.2*  PROT 6.8  ALBUMIN 2.8*   No results found for this basename: LIPASE, AMYLASE,  in the last 168 hours No results found for this basename: AMMONIA,  in the last 168 hours  CBC:  Recent Labs Lab 07/24/14 0430 07/30/14 0954  WBC 7.1 5.4  NEUTROABS  --  4.0  HGB 10.0* 9.6*  HCT 30.1* 28.3*  MCV 88.5 87.3  PLT 251 357    Cardiac Enzymes: No results found for this basename: CKTOTAL, CKMB, CKMBINDEX, TROPONINI,  in the last 168 hours  Lipid Panel: No results found for this basename: CHOL, TRIG, HDL, CHOLHDL, VLDL, LDLCALC,  in the last 168 hours  CBG: No results found for this basename: GLUCAP,  in the last 168 hours  Microbiology: Results for orders placed  during the hospital encounter of 07/20/14  URINE CULTURE     Status: None   Collection Time    07/20/14 12:37 PM      Result Value Ref Range Status   Specimen Description URINE, CLEAN CATCH   Final   Special Requests NONE   Final   Culture  Setup Time     Final   Value: 07/20/2014 19:21     Performed at [facility]     Final  Value: 45,000 COLONIES/ML     Performed at [facility]   Culture     Final   Value: Multiple bacterial morphotypes present, none predominant. Suggest appropriate recollection if clinically indicated.     Performed at [facility]   Report Status 07/21/2014 FINAL   Final  MRSA PCR SCREENING     Status: Abnormal   Collection Time    07/20/14  5:17 PM      Result Value Ref Range Status   MRSA by PCR POSITIVE (*) NEGATIVE Final   Comment:            The GeneXpert MRSA Assay (FDA     approved for NASAL specimens     only), is one component of a     comprehensive MRSA colonization     surveillance program. It is not     intended to diagnose MRSA     infection nor to guide or     monitor treatment for     MRSA infections.     RESULT CALLED TO, READ BACK BY AND VERIFIED WITH:     Hiram Gash RN AT 1909 07/20/14 BY Sarita    Coagulation Studies:  Recent Labs  07/30/14 0954  LABPROT 23.8*  INR 2.13*    Imaging: Ct Head Wo Contrast  07/30/2014   CLINICAL DATA:  Slurred speech  EXAM: CT HEAD WITHOUT CONTRAST  TECHNIQUE: Contiguous axial images were obtained from the base of the skull through the vertex without intravenous contrast.  COMPARISON:  07/25/2014  FINDINGS: The bony calvarium is intact. Atrophic changes are noted. A stable left occipital infarct is noted when compared with the prior exam. Scattered lacunar infarcts are noted within the deep white matter bilaterally and also stable. Chronic white matter ischemic changes noted. No acute hemorrhage, acute infarction or space-occupying mass lesion is  identified.  IMPRESSION: Chronic changes stable from recent MRI examination. No acute abnormality is noted.   Electronically Signed   By: Inez Catalina M.D.   On: 07/30/2014 10:00     Etta Quill PA-C [facility] [phone]  07/30/2014, 1:38 PM  Patient seen and examined.  Clinical course and management discussed.  Necessary edits performed.  I agree with the above.  Assessment and plan of care developed and discussed below.     Assessment: 80 y.o. male with known Afib on Xeralto presenting to ED with new onset [facility] and left sided weakness.  Right brain insult suspected.  Head CT reviewed and shows no acute changes.  Due to time of LKW and being on Xarelto, patient is not a tPA candidate.  Further work up recommended,    Stroke Risk Factors - atrial fibrillation, hyperlipidemia and hypertension,   1. HgbA1c, fasting lipid panel 2. MRI, MRA  of the brain without contrast 3. PT consult, OT consult, Speech consult 4. Carotid dopplers 5. Prophylactic therapy-continue Xeralto 6. . Telemetry monitoring 7. Frequent neuro checks  Alexis Goodell, MD [facility] [phone]  07/30/2014  1:38 PM

## 2014-07-31 ENCOUNTER — Inpatient Hospital Stay (HOSPITAL_COMMUNITY): Payer: Medicare Other

## 2014-07-31 DIAGNOSIS — I6330 Cerebral infarction due to thrombosis of unspecified cerebral artery: Secondary | ICD-10-CM

## 2014-07-31 DIAGNOSIS — R5381 Other malaise: Secondary | ICD-10-CM

## 2014-07-31 DIAGNOSIS — Z515 Encounter for palliative care: Secondary | ICD-10-CM

## 2014-07-31 DIAGNOSIS — C679 Malignant neoplasm of bladder, unspecified: Secondary | ICD-10-CM

## 2014-07-31 DIAGNOSIS — I359 Nonrheumatic aortic valve disorder, unspecified: Secondary | ICD-10-CM

## 2014-07-31 LAB — CBC
HCT: 29.3 % — ABNORMAL LOW (ref 39.0–52.0)
HEMOGLOBIN: 9.5 g/dL — AB (ref 13.0–17.0)
MCH: 29.7 pg (ref 26.0–34.0)
MCHC: 32.4 g/dL (ref 30.0–36.0)
MCV: 91.6 fL (ref 78.0–100.0)
PLATELETS: 376 10*3/uL (ref 150–400)
RBC: 3.2 MIL/uL — AB (ref 4.22–5.81)
RDW: 14.2 % (ref 11.5–15.5)
WBC: 5.2 10*3/uL (ref 4.0–10.5)

## 2014-07-31 LAB — GLUCOSE, CAPILLARY
GLUCOSE-CAPILLARY: 82 mg/dL (ref 70–99)
GLUCOSE-CAPILLARY: 91 mg/dL (ref 70–99)
GLUCOSE-CAPILLARY: 151 mg/dL — AB (ref 70–99)
Glucose-Capillary: 76 mg/dL (ref 70–99)
Glucose-Capillary: 152 mg/dL — ABNORMAL HIGH (ref 70–99)

## 2014-07-31 LAB — BASIC METABOLIC PANEL
Anion gap: 10 (ref 5–15)
BUN: 22 mg/dL (ref 6–23)
CO2: 29 mEq/L (ref 19–32)
Calcium: 8.9 mg/dL (ref 8.4–10.5)
Chloride: 96 mEq/L (ref 96–112)
Creatinine, Ser: 0.94 mg/dL (ref 0.50–1.35)
GFR calc non Af Amer: 75 mL/min — ABNORMAL LOW (ref >90)
GFR, EST AFRICAN AMERICAN: 87 mL/min — AB (ref >90)
Glucose, Bld: 83 mg/dL (ref 70–99)
POTASSIUM: 4.4 meq/L (ref 3.7–5.3)
SODIUM: 135 meq/L — AB (ref 137–147)

## 2014-07-31 LAB — LIPID PANEL
Cholesterol: 126 mg/dL (ref 0–200)
HDL: 42 mg/dL (ref >39)
LDL Cholesterol: 76 mg/dL (ref 0–99)
Total CHOL/HDL Ratio: 3 RATIO
Triglycerides: 40 mg/dL (ref <150)
VLDL: 8 mg/dL (ref 0–40)

## 2014-07-31 LAB — HEMOGLOBIN A1C
HEMOGLOBIN A1C: 5.9 % — AB (ref <5.7)
Mean Plasma Glucose: 123 mg/dL — ABNORMAL HIGH (ref <117)

## 2014-07-31 MED ORDER — STARCH (THICKENING) PO POWD
ORAL | Status: DC | PRN
  Filled 2014-07-31: qty 227

## 2014-07-31 MED ORDER — APIXABAN 2.5 MG PO TABS
2.5000 mg | ORAL_TABLET | Freq: Two times a day (BID) | ORAL | Status: DC
  Administered 2014-07-31 – 2014-08-05 (×10): 2.5 mg via ORAL
  Filled 2014-07-31 (×10): qty 1

## 2014-07-31 MED ORDER — RESOURCE THICKENUP CLEAR PO POWD
ORAL | Status: DC | PRN
  Filled 2014-07-31: qty 125

## 2014-07-31 NOTE — Progress Notes (Signed)
PT Cancellation Note  Patient Details Name: [name] MRN: [id number] DOB: 10-Oct-1927   Cancelled Treatment:    Reason Eval/Treat Not Completed: Patient at procedure or test/unavailable (echo in room).  PT to check back later today or tomorrow as time allows.    Thanks,    Barbarann Ehlers. [place], [place], DPT [phone]   07/31/2014, 4:16 PM

## 2014-07-31 NOTE — Evaluation (Signed)
Occupational Therapy Evaluation Patient Details Name: [name] MRN: [id number] DOB: [birth year] Today's Date: 07/31/2014    History of Present Illness 80 yo male admitted from [facility] after admission on 7/31 for STSNF. pt prior to this admission has been at home with wife. MRI reveals Rt MCA with Lt hemiplegia. PMH: Afib with RVR, COPD, UTI, bladder CA, TIA, CVA 12/2013,    Clinical Impression   PT admitted with RT MCA. Pt currently with functional limitiations due to the deficits listed below (see OT problem list).  Pt will benefit from skilled OT to increase their independence and safety with adls and balance to allow discharge CIR. Ot to follow acutely for adl retraining and static sitting tolerance.      Follow Up Recommendations  CIR    Equipment Recommendations  Other (comment) (defer to CIR)    Recommendations for Other Services Rehab consult     Precautions / Restrictions Precautions Precautions: Fall Precaution Comments: Residual weakness on RT due to CVA in 12/2013. New onset of LT UE flaccid with risk for subluxtion      Mobility Bed Mobility Overal bed mobility: Needs Assistance Bed Mobility: Supine to Sit Rolling: Max assist         General bed mobility comments: pt initated with RT UE adn LE  Transfers                      Balance Overall balance assessment: Needs assistance Sitting-balance support: Single extremity supported;Feet supported Sitting balance-Leahy Scale: Poor   Postural control: Left lateral lean                                  ADL Overall ADL's : Needs assistance/impaired Eating/Feeding: NPO   Grooming: Maximal assistance;Sitting;Wash/dry face Grooming Details (indicate cue type and reason): pt with poor lip closer and drooling with static sitting      Lower Body Bathing: Maximal assistance;Bed level Lower Body Bathing Details (indicate cue type and reason): pt attempting peri care with RT hand  supine. pt initiated and sustained attention to task Upper Body Dressing : Maximal assistance;Bed level   Lower Body Dressing: Total assistance;Bed level Lower Body Dressing Details (indicate cue type and reason): pt lifting Rt LE and iniitated movement with LT LE. pt unable to lift off bed surface               General ADL Comments: Pt with condom cath off on arrival and session focused on bed level hygiene. pt sitting EOB for ~15 minutes with weight bearing LT UE. Pt remained in bed due to pending MBS with SLP immediately at end of session. Room setup for RN assist to chair after MBS with return to room     Vision                     Perception Perception Perception Tested?: Yes Perception Deficits: Inattention/neglect Inattention/Neglect: Impaired- to be further tested in functional context Comments: pt with decr awareness to LT deficits. pt with Rt neck rotation and visually attending to Rt side more   Praxis      Pertinent Vitals/Pain Pain with peri care at penis and very red irritated skin noted      Hand Dominance Right   Extremity/Trunk Assessment Upper Extremity Assessment Upper Extremity Assessment: LUE deficits/detail RUE Deficits / Details: generalized weakness LUE Deficits / Details: brunstrom I - no subluxation  at this time LUE Sensation: decreased light touch LUE Coordination: decreased gross motor;decreased fine motor   Lower Extremity Assessment Lower Extremity Assessment: Defer to PT evaluation   Cervical / Trunk Assessment Cervical / Trunk Assessment: Kyphotic   Communication Communication Communication: Expressive difficulties   Cognition Arousal/Alertness: Awake/alert Behavior During Therapy: WFL for tasks assessed/performed Overall Cognitive Status: Impaired/Different from baseline Area of Impairment: Attention;Following commands;Awareness   Current Attention Level: Sustained   Following Commands: Follows one step commands with  increased time Safety/Judgement: Decreased awareness of deficits Awareness: Emergent Problem Solving: Slow processing;Difficulty sequencing General Comments: pt attempting to place wash cloth in LT UE and demonstrates poor awarenss to LT deficits   General Comments       Exercises       Shoulder Instructions      Home Living Family/patient expects to be discharged to:: Inpatient rehab Living Arrangements: Spouse/significant other Available Help at Discharge: Family Type of Home: Other(Comment) (pt had just been admitted to SNF for short-term rehab)                           Additional Comments: Pt was living at home until last week after recent admission to [facility]      Prior Functioning/Environment Level of Independence: Needs assistance    ADL's / Homemaking Assistance Needed: wife (A) with adls at baseline PTA.         OT Diagnosis: Generalized weakness;Cognitive deficits;Hemiplegia non-dominant side   OT Problem List: Decreased strength;Decreased activity tolerance;Decreased range of motion;Impaired balance (sitting and/or standing);Decreased safety awareness;Decreased knowledge of use of DME or AE;Decreased cognition;Decreased knowledge of precautions;Cardiopulmonary status limiting activity;Impaired sensation;Impaired tone;Impaired UE functional use   OT Treatment/Interventions: Self-care/ADL training;Therapeutic exercise;Neuromuscular education;DME and/or AE instruction;Therapeutic activities;Cognitive remediation/compensation;Visual/perceptual remediation/compensation;Patient/family education;Balance training    OT Goals(Current goals can be found in the care plan section) Acute Rehab OT Goals Patient Stated Goal: pt did not state. Wife states "we want to go to rehab so we can go home again" OT Goal Formulation: With patient/family Time For Goal Achievement: 08/14/14 Potential to Achieve Goals: Good  OT Frequency: Min 2X/week   Barriers to D/C:             Co-evaluation              End of Session Nurse Communication: Mobility status;Precautions  Activity Tolerance: Patient tolerated treatment well Patient left: in bed;with call bell/phone within reach;with family/visitor present   Time: 6759-1638 OT Time Calculation (min): 36 min Charges:  OT General Charges $OT Visit: 1 Procedure OT Evaluation $Initial OT Evaluation Tier I: 1 Procedure OT Treatments $Self Care/Home Management : 23-37 mins G-Codes:    Peri Maris 2014-08-16, 12:21 PM Pager: [phone]

## 2014-07-31 NOTE — Evaluation (Signed)
Clinical/Bedside Swallow Evaluation Patient Details  Name: [name] MRN: [id number] Date of Birth: [birth year]  Today's Date: 07/31/2014 Time: 3976-7341 SLP Time Calculation (min): 15 min  Past Medical History:  Past Medical History  Diagnosis Date  . Bladder cancer     a. Dr. Lawerance Bach - [facility]. Multiple surgeries, chemo trial.  . Hyperlipidemia   . Anemia 04/10/2009  . Immune thrombocytopenic purpura     a. s/p splenectomy.  Marland Kitchen Anxiety   . Hypertension   . Permanent atrial fibrillation     a. Historically difficult to control rates  . COPD   . TIA (transient ischemic attack)     a. H/o TIA 2007.  . Stroke     a. H/o remote lacunar infarcts, at least 3 of them. b. Right lenticular nucleus and corona radiata infarct, cardioembolic 08/3789. c. Stroke 11/2013, new infarct 2 weeks later 12/2013.  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 Gross hematuria     a. H/o bladder CA with intermittent gross hematuria.  Marland Kitchen Dysphagia   . Anemia    Past Surgical History:  Past Surgical History  Procedure Laterality Date  . Splenectomy    . Bilateral vats ablation    . Facial cancer      facial skin cancer   HPI:  [name] is a 80 y.o. male, with H/O multiple bilateral CVAs, at baseline left side stronger than right, atrial fibrillation on xaralto, hypertension, COPD, UTI, chronic intermittent hematuria, who was recently discharged to a nursing home from this facility after a UTI related admission, Pt was admitted from [facility] 07/30/14 with slurred speech and new onset dense left-sided weakness. MRI revealed a small, acute infarct primarily involving the right corona radiata.   Assessment / Plan / Recommendation Clinical Impression  Pt with known h/o dysphagia presents with signs of decreased strength and efficiency with oropharyngeal swallow, leading to overt signs of aspiration with all consistencies tested.  Mod multimodal cueing from SLP for swallowing strategies were not effective at reducing signs of aspiration. Recommend to proceed with MBS prior to initiating PO diet to more objectively assess current aspiration risk.    Aspiration Risk  Severe    Diet Recommendation NPO   Medication Administration: Via alternative means    Other  Recommendations Recommended Consults: MBS Oral Care Recommendations: Oral care Q4 per protocol   Follow Up Recommendations  Inpatient Rehab;24 hour supervision/assistance (pending PT/OT evaluations)    Frequency and Duration        Pertinent Vitals/Pain Afebrile, denies any pain    SLP Swallow Goals     Swallow Study Prior Functional Status       General Date of Onset: 07/30/14 HPI: [name] is a 80 y.o. male, with H/O multiple bilateral CVAs, at baseline left side stronger than right, atrial fibrillation on xaralto, hypertension, COPD, UTI, chronic intermittent hematuria, who was recently discharged to a nursing home from this facility after a UTI related admission, Pt was admitted from [facility] 07/30/14 with slurred speech and new onset dense left-sided weakness. MRI revealed a small, acute infarct primarily involving the right corona radiata. Type of Study: Bedside swallow evaluation Previous Swallow Assessment: most recent MBS 12/2013 recommending Dys 1 and honey. Pt discharged from that admission on Dys 3 and nectar with the water protocol. Diet Prior to this Study: NPO Temperature Spikes Noted: No Respiratory Status: Room air History of Recent Intubation: No  Behavior/Cognition: Alert;Cooperative;Pleasant mood;Requires cueing Oral Cavity - Dentition: Adequate natural dentition Self-Feeding Abilities: Able to feed self;Needs assist Patient Positioning: Upright in bed Baseline Vocal Quality: Low vocal intensity    Oral/Motor/Sensory Function Overall Oral Motor/Sensory Function: Impaired Labial ROM: Reduced left Labial Symmetry: Abnormal symmetry  left Labial Strength: Reduced Labial Sensation: Reduced Lingual ROM: Within Functional Limits Lingual Symmetry: Abnormal symmetry left Facial Symmetry: Left droop Mandible: Within Functional Limits   Ice Chips Ice chips: Impaired Presentation: Spoon Pharyngeal Phase Impairments: Suspected delayed Swallow;Wet Vocal Quality   Thin Liquid Thin Liquid: Impaired Presentation: Cup;Self Fed Oral Phase Impairments: Reduced labial seal Oral Phase Functional Implications: Left anterior spillage;Oral holding;Other (comment) (pt has oral holding at baseline) Pharyngeal  Phase Impairments: Suspected delayed Swallow;Multiple swallows;Cough - Immediate    Nectar Thick Nectar Thick Liquid: Impaired Presentation: Cup;Self Fed Oral Phase Impairments: Reduced labial seal Oral phase functional implications: Oral holding;Left anterior spillage;Other (comment) (pt has oral holding at baseline) Pharyngeal Phase Impairments: Suspected delayed Swallow;Multiple swallows;Cough - Immediate   Honey Thick Honey Thick Liquid: Not tested   Puree Puree: Impaired Presentation: Self Fed;Spoon Oral Phase Impairments: Reduced labial seal Oral Phase Functional Implications: Left anterior spillage;Prolonged oral transit;Other (comment) (appears to have piecemeal swallows) Pharyngeal Phase Impairments: Suspected delayed Swallow;Multiple swallows;Cough - Immediate   Solid   GO    Solid: Not tested        [name], M.A. CCC-SLP [phone]  [name] 07/31/2014,10:00 AM

## 2014-07-31 NOTE — Evaluation (Signed)
Speech Language Pathology Evaluation Patient Details Name: [name] MRN: [id number] DOB: 08/17/1927 Today's Date: 07/31/2014 Time: 0900-0930 SLP Time Calculation (min): 30 min  Problem List:  Patient Active Problem List   Diagnosis Date Noted  . Physical deconditioning 07/29/2014  . Protein-calorie malnutrition, severe 07/22/2014  . Atrial fibrillation with rapid ventricular response 07/20/2014  . History of cerebral embolic infarction 20/25/4270  . Gait disturbance, post-stroke 05/13/2014  . Cognitive deficit status post cerebrovascular accident 05/13/2014  . Hematuria 03/01/2014  . Acute CVA (cerebrovascular accident) 01/07/2014  . Altered mental status 01/05/2014  . Influenza with other respiratory manifestations 01/01/2014  . Prediabetes 01/01/2014  . CVA (cerebral infarction) 01/01/2014  . Atrial fibrillation 12/25/2013  . Constipation 10/16/2012  . Preventative health care 09/17/2012  . Nocturia 09/17/2012  . UTI (lower urinary tract infection) 08/22/2012  . Pseudogout 08/22/2012  . Encephalopathy 08/21/2012  . Left knee pain 08/21/2012  . Prerenal azotemia 08/21/2012  . CVA (cerebral vascular accident)-Acute right small nonhemorrhagic infarct seen on MRI 08/17/2012  . Lacunar stroke 04/07/2011  . Pleural effusion 04/07/2011  . 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 Long term current use of anticoagulant 04/07/2011  . NEOPLASM, MALIGNANT, BLADDER 04/10/2009  . HYPERLIPIDEMIA 04/10/2009  . ANEMIA-NOS 04/10/2009  . 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 ANXIETY 04/10/2009  . HYPERTENSION 04/10/2009  . COPD 04/10/2009  . TRANSIENT ISCHEMIC ATTACK, HX OF 04/10/2009   Past Medical History:  Past Medical History  Diagnosis Date  . Bladder cancer     a. Dr. Lawerance Bach - [facility]. Multiple surgeries, chemo trial.  . Hyperlipidemia   . Anemia 04/10/2009  . Immune thrombocytopenic purpura     a. s/p splenectomy.  Marland Kitchen Anxiety   . Hypertension   . Permanent atrial  fibrillation     a. Historically difficult to control rates  . COPD   . TIA (transient ischemic attack)     a. H/o TIA 2007.  . Stroke     a. H/o remote lacunar infarcts, at least 3 of them. b. Right lenticular nucleus and corona radiata infarct, cardioembolic 05/2375. c. Stroke 11/2013, new infarct 2 weeks later 12/2013.  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 Gross hematuria     a. H/o bladder CA with intermittent gross hematuria.  Marland Kitchen Dysphagia   . Anemia    Past Surgical History:  Past Surgical History  Procedure Laterality Date  . Splenectomy    . Bilateral vats ablation    . Facial cancer      facial skin cancer   HPI:  [name] is a 80 y.o. male, with H/O multiple bilateral CVAs, at baseline left side stronger than right, atrial fibrillation on xaralto, hypertension, COPD, UTI, chronic intermittent hematuria, who was recently discharged to a nursing home from this facility after a UTI related admission, Pt was admitted from [facility] 07/30/14 with slurred speech and new onset dense left-sided weakness. MRI revealed a small, acute infarct primarily involving the right corona radiata.   Assessment / Plan / Recommendation Clinical Impression  Pt is known to this therapist from previous admission, and presents today with decreased intelligibility of speech and cognitive function. Pt has a moderate-severe dysarthria that is characterized by imprecise articulation, increased rate, and low vocal intensity likely secondary to reduced breath support. Mod cues from SLP to speak "loud and slow" significantly increased intelligibility. Cognitively he demonstrates decreased awareness of new impairments, basic problem solving,  sustained attention, and working memory as compared to his baseline. Pt will benefit from skilled SLP services to maximize functional communication and cognition during his acute stay, and would  benefit from CIR upon d/c.    SLP Assessment  Patient needs continued Speech Lanaguage Pathology Services    Follow Up Recommendations  Inpatient Rehab;24 hour supervision/assistance    Frequency and Duration min 2x/week  2 weeks   Pertinent Vitals/Pain n/a   SLP Goals  SLP Goals Potential to Achieve Goals: Good Potential Considerations: Ability to learn/carryover information;Previous level of function  SLP Evaluation Prior Functioning  Cognitive/Linguistic Baseline: Baseline deficits Baseline deficit details: wife reports that pt has been continuing to work with SLP since previous admission, targeting cognition (memory) and aphasia  Type of Home: Other(Comment) (pt had just been admitted to SNF for short-term rehab) Available Help at Discharge: Family;Available 24 hours/day Vocation: Retired   Associate Professor  Overall Cognitive Status: Impaired/Different from baseline Arousal/Alertness: Awake/alert Orientation Level: Oriented to person Attention: Sustained Sustained Attention: Impaired Sustained Attention Impairment: Verbal basic Memory: Impaired Memory Impairment: Decreased recall of new information Awareness: Impaired Awareness Impairment: Emergent impairment;Anticipatory impairment;Intellectual impairment Problem Solving: Impaired Problem Solving Impairment: Functional basic Safety/Judgment: Impaired    Comprehension  Auditory Comprehension Overall Auditory Comprehension: Impaired Commands: Impaired One Step Basic Commands: 75-100% accurate Conversation: Simple Other Conversation Comments: requries additional processing time, asked for SLP to repeat information when he did not understand Interfering Components: Attention;Processing speed;Working Field seismologist: Conservation officer, nature: Not tested Reading Comprehension Reading Status: Not tested    Expression Expression Primary Mode of  Expression: Verbal Verbal Expression Overall Verbal Expression: Impaired at baseline (pt with residual, mild aphasia from previous CVA) Written Expression Written Expression: Not tested   Oral / Motor Oral Motor/Sensory Function Overall Oral Motor/Sensory Function: Impaired Labial ROM: Reduced left Labial Symmetry: Abnormal symmetry left Labial Strength: Reduced Labial Sensation: Reduced Lingual ROM: Within Functional Limits Lingual Symmetry: Abnormal symmetry left Facial Symmetry: Left droop Mandible: Within Functional Limits Motor Speech Overall Motor Speech: Impaired Respiration: Impaired Level of Impairment: Sentence Phonation: Low vocal intensity Resonance: Within functional limits Articulation: Impaired Level of Impairment: Sentence Intelligibility: Intelligibility reduced Word: 75-100% accurate Phrase: 50-74% accurate Sentence: 25-49% accurate Conversation: 25-49% accurate Motor Speech Errors: Not applicable Interfering Components: Premorbid status Effective Techniques: Slow rate;Increased vocal intensity   GO      Germain Osgood, M.A. CCC-SLP [phone]  Germain Osgood 07/31/2014, 10:14 AM

## 2014-07-31 NOTE — Progress Notes (Signed)
  Echocardiogram 2D Echocardiogram has been performed.  [name] 07/31/2014, 4:54 PM

## 2014-07-31 NOTE — Evaluation (Signed)
Physical Therapy Evaluation Patient Details Name: [name] MRN: [id number] DOB: 15-Aug-1927 Today's Date: 07/31/2014   History of Present Illness  82 t.o. male admitted to [facility] on 07/30/14 from SNF where he was participating in rehab after recent admission on 7/31.  He presented with L sided weakness.  Stroke workup in progress.  MRI revealed right corona radiata infarct.  Pt with significant PMHx of anemia, HTN, A-fib, COPD, stroke/TIA, and bladder CA,.    Clinical Impression  Pt presents with dense left hemiperesis, pushing to the right in both sitting and attempted standing.  Just PTA (and based on last therapy notes from last admission), pt was walking with RW.  Pt would benefit from CIR level therapies prior to discharge as he has a significant change in his physical presentation and would benefit from this level of therapy to maximize his functional outcomes.   PT to follow acutely for deficits listed below.       Follow Up Recommendations CIR    Equipment Recommendations  Wheelchair (measurements PT);Wheelchair cushion (measurements PT);Hospital bed;Other (comment) (hoyer lift)    Recommendations for Other Services Rehab consult     Precautions / Restrictions Precautions Precautions: Fall Precaution Comments: L sided weakness with L upper extremity flaccid at risk for subluxation      Mobility  Bed Mobility Overal bed mobility: Needs Assistance Bed Mobility: Rolling;Supine to Sit;Sit to Supine Rolling: +2 for physical assistance;Max assist;Total assist   Supine to sit: +2 for physical assistance;Max assist Sit to supine: +2 for physical assistance;Total assist   General bed mobility comments: Two person max assist to roll to the left.  Pt able with manual assist to use his right arm to pull against the bed rail.  Total assist to roll to the right as he is flaccid in his left upper extremity.  Two person max assist to get to sitting EOB. Pt attempting to use his right arm to  pull up against PT to help get to sitting.  He was also able to move his right leg over the side of the bed with cues.    Transfers Overall transfer level: Needs assistance   Transfers: Sit to/from Stand Sit to Stand: Total assist         General transfer comment: Attempted to stand and unable to successfully get to standing even with left knee blocked due to pt pushing over to his right and unable to keep his right foot on the ground.  Pt repositioned in supine in the bed and maxi move total assist lift used to get pt OOB to the recliner chair to eat.   Ambulation/Gait             General Gait Details: unable at this time      Modified Rankin (Stroke Patients Only) Modified Rankin (Stroke Patients Only) Pre-Morbid Rankin Score: Moderately severe disability Modified Rankin: Severe disability     Balance Overall balance assessment: Needs assistance Sitting-balance support: Feet supported;Single extremity supported Sitting balance-Leahy Scale: Zero Sitting balance - Comments: max assist to maintain sitting balance EOB as pt is leaning posteriorly and pushing to the right.  In sitting we worked on upright posture and decreasing the pushing by having pt reach with his right hand outside of BOS both forward and to the right.  Postural control: Posterior lean;Left lateral lean Standing balance support: Single extremity supported Standing balance-Leahy Scale: Zero Standing balance comment: unable to get to standing.  Pertinent Vitals/Pain See vitals flow sheet.     Home Living Family/patient expects to be discharged to:: Inpatient rehab Living Arrangements: Spouse/significant other Available Help at Discharge: Family Type of Home: Other(Comment) (pt had just been admitted to SNF for short-term rehab)           Additional Comments: Pt was living at home until last week after recent admission to [facility]    Prior Function Level of  Independence: Needs assistance   Gait / Transfers Assistance Needed: Able to ambulate ~15 feet with RW and wife assist.   ADL's / Homemaking Assistance Needed: wife (A) with adls at baseline PTA.         Hand Dominance   Dominant Hand: Right    Extremity/Trunk Assessment   Upper Extremity Assessment: Defer to OT evaluation           Lower Extremity Assessment: RLE deficits/detail;LLE deficits/detail RLE Deficits / Details: right leg with generalized weakness 3 to 3+/5 per gross bed level assessment.  LLE Deficits / Details: left leg with trace movement throughout ankle, knee, and hip and hard to say if these are reflexive vs flexor tone as I was only able to elicit movement with touching of the bottom of his left foot, not to command.   Cervical / Trunk Assessment: Kyphotic;Other exceptions  Communication   Communication: Expressive difficulties (soft hoarse voice, see SLP notes for details)  Cognition Arousal/Alertness: Awake/alert Behavior During Therapy: Flat affect Overall Cognitive Status: Impaired/Different from baseline Area of Impairment: Attention;Following commands;Awareness   Current Attention Level: Sustained Memory: Decreased short-term memory Following Commands: Follows one step commands with increased time Safety/Judgement: Decreased awareness of deficits Awareness: Emergent Problem Solving: Slow processing;Difficulty sequencing General Comments: Pt thinks that he can walk to the bathroom and is requesting this so he can have a BM.     General Comments General comments (skin integrity, edema, etc.): O2 K[place] left on entire session          Assessment/Plan    PT Assessment Patient needs continued PT services  PT Diagnosis Difficulty walking;Abnormality of gait;Generalized weakness;Hemiplegia non-dominant side;Altered mental status   PT Problem List Decreased strength;Decreased activity tolerance;Decreased balance;Decreased mobility;Decreased  cognition;Decreased coordination;Decreased knowledge of use of DME;Decreased safety awareness;Impaired sensation  PT Treatment Interventions DME instruction;Gait training;Functional mobility training;Therapeutic activities;Therapeutic exercise;Balance training;Neuromuscular re-education;Cognitive remediation;Patient/family education   PT Goals (Current goals can be found in the Care Plan section) Acute Rehab PT Goals Patient Stated Goal: unable to state PT Goal Formulation: With patient/family Time For Goal Achievement: 08/14/14 Potential to Achieve Goals: Good    Frequency Min 4X/week   Barriers to discharge Decreased caregiver support elderly wife       End of Session   Activity Tolerance: Patient limited by fatigue Patient left: in chair;with call bell/phone within reach;with chair alarm set;with family/visitor present Nurse Communication: Mobility status;Need for lift equipment         Time: 9476-5465 PT Time Calculation (min): 55 min   Charges:   PT Evaluation $Initial PT Evaluation Tier I: 1 Procedure PT Treatments $Therapeutic Activity: 23-37 mins $Neuromuscular Re-education: 8-22 mins        Kahli Fitzgerald B. [place], [place], DPT [phone]   07/31/2014, 6:43 PM

## 2014-07-31 NOTE — Procedures (Signed)
Objective Swallowing Evaluation: Modified Barium Swallowing Study  Patient Details  Name: [name] MRN: [id number] Date of Birth: 05/23/1927  Today's Date: 07/31/2014 Time: 9735-3299 SLP Time Calculation (min): 26 min  Past Medical History:  Past Medical History  Diagnosis Date  . Bladder cancer     a. Dr. Lawerance Bach - [facility]. Multiple surgeries, chemo trial.  . Hyperlipidemia   . Anemia 04/10/2009  . Immune thrombocytopenic purpura     a. s/p splenectomy.  Marland Kitchen Anxiety   . Hypertension   . Permanent atrial fibrillation     a. Historically difficult to control rates  . COPD   . TIA (transient ischemic attack)     a. H/o TIA 2007.  . Stroke     a. H/o remote lacunar infarcts, at least 3 of them. b. Right lenticular nucleus and corona radiata infarct, cardioembolic 01/4267. c. Stroke 11/2013, new infarct 2 weeks later 12/2013.  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 Gross hematuria     a. H/o bladder CA with intermittent gross hematuria.  Marland Kitchen Dysphagia   . Anemia    Past Surgical History:  Past Surgical History  Procedure Laterality Date  . Splenectomy    . Bilateral vats ablation    . Facial cancer      facial skin cancer   HPI:  [name] is a 80 y.o. male, with H/O multiple bilateral CVAs, at baseline left side stronger than right, atrial fibrillation on xaralto, hypertension, COPD, UTI, chronic intermittent hematuria, who was recently discharged to a nursing home from this facility after a UTI related admission, Pt was admitted from [facility] 07/30/14 with slurred speech and new onset dense left-sided weakness. MRI revealed a small, acute infarct primarily involving the right corona radiata.     Assessment / Plan / Recommendation Clinical Impression  Dysphagia Diagnosis: Moderate oral phase dysphagia;Severe oral phase dysphagia;Moderate pharyngeal phase dysphagia;Severe pharyngeal phase  dysphagia  Clinical Impression:   Pt presents with a moderate-severe oropharyngeal dysphagia that is sensorimotor based. Pt's oral phase is characterized by weak and discoordinated manipulation with lingual pumping and decreased bolus cohesion, leading to both anterior and posterior loss. Pt exhibited very delayed mastication with soft solid with residuals remaining in oral cavity. Unfortunately, all liquid consistencies tested resulted in silent aspiration or deep penetration to the vocal folds, which occurred before the swallow with prematurely spilled material. Solid PO was not observed to enter the airway, although pharyngeal weakness led to mild-moderate diffuse residue, particularly at the valleculae. Recommend to initiate Dys 1 textures and pudding thick liquids with pharyngeal strengthening exercises to maximize strength and efficiency.    Treatment Recommendation  Therapy as outlined in treatment plan below    Diet Recommendation Dysphagia 1 (Puree);Pudding-thick liquid   Liquid Administration via: Spoon Medication Administration: Crushed with puree Supervision: Patient able to self feed;Staff to assist with self feeding;Full supervision/cueing for compensatory strategies Compensations: Slow rate;Small sips/bites;Check for pocketing;Check for anterior loss;Multiple dry swallows after each bite/sip Postural Changes and/or Swallow Maneuvers: Seated upright 90 degrees;Upright 30-60 min after meal    Other  Recommendations Recommended Consults: MBS Oral Care Recommendations: Oral care BID Other Recommendations: Order thickener from pharmacy;Prohibited food (jello, ice cream, thin soups);Remove water pitcher;Have oral suction available   Follow Up Recommendations  Inpatient Rehab;24 hour supervision/assistance    Frequency and Duration min 2x/week  2 weeks   Pertinent Vitals/Pain n/a    SLP  Swallow Goals     General Date of Onset: 07/30/14 HPI: [name] is a 80 y.o. male, with  H/O multiple bilateral CVAs, at baseline left side stronger than right, atrial fibrillation on xaralto, hypertension, COPD, UTI, chronic intermittent hematuria, who was recently discharged to a nursing home from this facility after a UTI related admission, Pt was admitted from [facility] 07/30/14 with slurred speech and new onset dense left-sided weakness. MRI revealed a small, acute infarct primarily involving the right corona radiata. Type of Study: Modified Barium Swallowing Study Reason for Referral: Objectively evaluate swallowing function Previous Swallow Assessment: most recent MBS 12/2013 recommending Dys 1 and honey. Pt discharged from that admission on Dys 3 and nectar with the water protocol. Diet Prior to this Study: NPO Temperature Spikes Noted: No Respiratory Status: Room air History of Recent Intubation: No Behavior/Cognition: Alert;Cooperative;Pleasant mood;Requires cueing Oral Cavity - Dentition: Adequate natural dentition Oral Motor / Sensory Function: Impaired - see Bedside swallow eval Self-Feeding Abilities: Needs assist Patient Positioning: Upright in chair Baseline Vocal Quality: Low vocal intensity Volitional Cough: Weak Volitional Swallow: Able to elicit Anatomy: Within functional limits Pharyngeal Secretions: Not observed secondary MBS    Reason for Referral Objectively evaluate swallowing function   Oral Phase Oral Preparation/Oral Phase Oral Phase: Impaired Oral - Honey Oral - Honey Teaspoon: Left anterior bolus loss;Weak lingual manipulation;Lingual pumping;Reduced posterior propulsion;Delayed oral transit Oral - Honey Cup: Left anterior bolus loss;Weak lingual manipulation;Lingual pumping;Reduced posterior propulsion;Delayed oral transit Oral - Nectar Oral - Nectar Teaspoon: Left anterior bolus loss;Weak lingual manipulation;Lingual pumping;Reduced posterior propulsion;Delayed oral transit Oral - Nectar Cup: Left anterior bolus loss;Weak lingual manipulation;Lingual  pumping;Reduced posterior propulsion;Delayed oral transit Oral - Solids Oral - Puree: Left anterior bolus loss;Weak lingual manipulation;Lingual pumping;Reduced posterior propulsion;Delayed oral transit Oral - Mechanical Soft: Left anterior bolus loss;Weak lingual manipulation;Lingual pumping;Reduced posterior propulsion;Delayed oral transit;Impaired mastication;Lingual/palatal residue   Pharyngeal Phase Pharyngeal Phase Pharyngeal Phase: Impaired Pharyngeal - Honey Pharyngeal - Honey Teaspoon: Premature spillage to pyriform sinuses;Reduced anterior laryngeal mobility;Reduced laryngeal elevation;Reduced tongue base retraction;Penetration/Aspiration before swallow;Reduced pharyngeal peristalsis;Pharyngeal residue - valleculae;Pharyngeal residue - pyriform sinuses;Pharyngeal residue - posterior pharnyx Penetration/Aspiration details (honey teaspoon): Material enters airway, passes BELOW cords without attempt by patient to eject out (silent aspiration) Pharyngeal - Honey Cup: Premature spillage to pyriform sinuses;Reduced anterior laryngeal mobility;Reduced laryngeal elevation;Reduced tongue base retraction;Penetration/Aspiration before swallow;Reduced pharyngeal peristalsis;Pharyngeal residue - valleculae;Pharyngeal residue - pyriform sinuses;Pharyngeal residue - posterior pharnyx Penetration/Aspiration details (honey cup): Material enters airway, passes BELOW cords without attempt by patient to eject out (silent aspiration) Pharyngeal - Nectar Pharyngeal - Nectar Teaspoon: Premature spillage to pyriform sinuses;Reduced anterior laryngeal mobility;Reduced laryngeal elevation;Reduced tongue base retraction;Penetration/Aspiration before swallow;Reduced pharyngeal peristalsis;Pharyngeal residue - valleculae;Pharyngeal residue - pyriform sinuses;Pharyngeal residue - posterior pharnyx Penetration/Aspiration details (nectar teaspoon): Material enters airway, CONTACTS cords and not ejected out Pharyngeal -  Nectar Cup: Premature spillage to pyriform sinuses;Reduced anterior laryngeal mobility;Reduced laryngeal elevation;Reduced tongue base retraction;Penetration/Aspiration before swallow;Reduced pharyngeal peristalsis;Pharyngeal residue - valleculae;Pharyngeal residue - pyriform sinuses;Pharyngeal residue - posterior pharnyx Penetration/Aspiration details (nectar cup): Material enters airway, passes BELOW cords without attempt by patient to eject out (silent aspiration) Pharyngeal - Solids Pharyngeal - Puree: Reduced anterior laryngeal mobility;Reduced laryngeal elevation;Reduced tongue base retraction;Reduced pharyngeal peristalsis;Pharyngeal residue - valleculae;Pharyngeal residue - pyriform sinuses;Pharyngeal residue - posterior pharnyx;Premature spillage to valleculae Penetration/Aspiration details (puree): Material does not enter airway Pharyngeal - Mechanical Soft: Reduced anterior laryngeal mobility;Reduced laryngeal elevation;Reduced tongue base retraction;Reduced pharyngeal peristalsis;Pharyngeal residue - valleculae;Pharyngeal residue - pyriform sinuses;Pharyngeal residue - posterior pharnyx;Premature spillage  to valleculae Penetration/Aspiration details (mechanical soft): Material does not enter airway  Cervical Esophageal Phase    GO    Cervical Esophageal Phase Cervical Esophageal Phase: [facility]        Germain Osgood, M.A. CCC-SLP [phone]  Germain Osgood 07/31/2014, 12:13 PM

## 2014-07-31 NOTE — Consult Note (Signed)
Patient [name]      DOB: 1927/12/17      QBH:[id number]     Consult Note from the Palliative Medicine Team at [facility] Requested by: Dr. Aileen Fass   PCP: Cathlean Cower, MD Reason for Consultation: [place] and options.   Phone Number:[phone]  Assessment of patients Current state:  I met today with [name] wife, [name], and their son, [name]. [name] confirms DNR. [name] was just started on dysphagia I pureed diet with pudding thick liquids. When I first come he is sitting up and [name] is feeding him his meal of which he had already eaten >50%. His family tells me how he has overcome many strokes and the past 2 years have had many highs and lows. They tell me how he has always bounced back and recovered well. Goal is to get him home. 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 He has a difficult time with loss of his independence and not being able to drive his car or tractor. They also believe he likely has a fear of nursing home as his mother had a stroke and died there and his brother recently died in a nursing home as well Designer, jewellery). This is why getting him home is so important. [name] also recalls conversations about a family member with a feeding tube and does not believe he would wish for this - but she may consider if she believes he has quality of life left. Hopes are for now to continue in CIR for intensive rehab to optimize health and know his baseline - watchful waiting. I will continue to follow and support.    Goals of Care: 1.  Code Status: DNR   2. Scope of Treatment: Continue supportive therapy and medical management. DNR confirmed and no heroic life-sustaining measures desired.    4. Disposition: Hopeful for CIR.    3. Symptom Management:   1. Bowel Regimen:  Senokot-S qhs prn.  2. Nausea/Vomiting: Ondansetron prn.  3. Weakness: Continue medical management. PT/OT/SLP following.   4. Psychosocial: Emotional support provided to patient and family.    Brief HPI: [name] is a 80 yo male admitted with stroke and left sided weakness. PMH significant for previous stroke (8/13, 12/14, 1/15), COPD, HTN, atrial fibrillation, bladder cancer (localized to the bladder and stable after experimental treatment per family), hematuria, protein calorie malnutrition.    ROS: Unable to assess - dysarthric speech.     PMH:  Past Medical History  Diagnosis Date  . Bladder cancer     a. Dr. Lawerance Bach - [facility]. Multiple surgeries, chemo trial.  . Hyperlipidemia   . Anemia 04/10/2009  . Immune thrombocytopenic purpura     a. s/p splenectomy.  Marland Kitchen Anxiety   . Hypertension   . Permanent atrial fibrillation     a. Historically difficult to control rates  . COPD   . TIA (transient ischemic attack)     a. H/o TIA 2007.  . Stroke     a. H/o remote lacunar infarcts, at least 3 of them. b. Right lenticular nucleus and corona radiata infarct, cardioembolic 0/9735. c. Stroke 11/2013, new infarct 2 weeks later 12/2013.  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 Gross hematuria     a. H/o bladder CA with intermittent gross hematuria.  Marland Kitchen Dysphagia   . Anemia      PSH: Past Surgical History  Procedure Laterality Date  . Splenectomy    . Bilateral vats ablation    . Facial cancer      facial skin cancer   I have reviewed the [place] and [facility] and  If appropriate update it with new information. Allergies  Allergen Reactions  . Azithromycin Other (See Comments)    Altered mental status  . Diazepam Other (See Comments)    REACTION: agitation  . Ezetimibe-Simvastatin Other (See Comments)    unknown  . Morphine Other (See Comments)    Hallucinations.  . Prednisone Other (See Comments)     Dizziness  . Sulfonamide Derivatives Hives   Scheduled Meds: . amiodarone  200 mg Oral BID  . apixaban  2.5 mg Oral BID  . aspirin  300 mg Rectal Daily  . bacitracin-polymyxin b   Both Eyes 6 times per day  . cholecalciferol  2,000 Units Oral Daily  . digoxin  0.125 mg Oral Daily  . diltiazem  180 mg Oral Daily  . docusate sodium  200 mg Oral BID  . insulin aspart  0-15 Units Subcutaneous 6 times per day  . sodium chloride  3 mL Intravenous Q12H   Continuous Infusions: . sodium chloride 50 mL/hr (07/31/14 0420)   PRN Meds:.food thickener, guaiFENesin-dextromethorphan, ondansetron (ZOFRAN) IV, ondansetron, RESOURCE THICKENUP CLEAR, senna-docusate    BP 119/54  Pulse 83  Temp(Src) 98.1 F (36.7 C) (Oral)  Resp 20  Ht _0  (1.753 m)  Wt 52.5 kg (115 lb 11.9 oz)  BMI 17.08 kg/m2  SpO2 100%   PPS: 30%   Intake/Output Summary (Last 24 hours) at 07/31/14 1603 Last data filed at 07/31/14 0808  Gross per 24 hour  Intake      0 ml  Output    850 ml  Net   -850 ml   LBM: 07/29/14                         Physical Exam:  General: NAD, thin, frail HEENT: [place]/AT, no JVD, moist mucous membranes Chest: No labored breathing, symmetric CVS: RRR Abdomen: Soft, NT, ND Ext: Left sided weakness, no edema Neuro: Awake, alert, oriented to self, able to follow simple commands  Labs: CBC    Component Value Date/Time   WBC 5.2 07/31/2014 0500   RBC 3.20* 07/31/2014 0500   HGB 9.5* 07/31/2014 0500   HCT 29.3* 07/31/2014 0500   PLT 376 07/31/2014 0500   MCV 91.6 07/31/2014 0500   MCH 29.7 07/31/2014 0500   MCHC 32.4 07/31/2014 0500   RDW 14.2 07/31/2014 0500   LYMPHSABS 0.7 07/30/2014 0954   MONOABS 0.6 07/30/2014 0954   EOSABS 0.1 07/30/2014 0954   BASOSABS 0.0 07/30/2014 0954    BMET    Component Value Date/Time   NA 135* 07/31/2014 0500   K 4.4 07/31/2014 0500   CL 96 07/31/2014 0500   CO2 29 07/31/2014 0500   GLUCOSE 83 07/31/2014 0500   BUN 22 07/31/2014 0500   CREATININE 0.94 07/31/2014 0500    CALCIUM 8.9 07/31/2014 0500   GFRNONAA 75* 07/31/2014 0500   GFRAA 87* 07/31/2014 0500    CMP     Component Value Date/Time   NA 135* 07/31/2014 0500   K 4.4 07/31/2014 0500  CL 96 07/31/2014 0500   CO2 29 07/31/2014 0500   GLUCOSE 83 07/31/2014 0500   BUN 22 07/31/2014 0500   CREATININE 0.94 07/31/2014 0500   CALCIUM 8.9 07/31/2014 0500   PROT 6.8 07/30/2014 0954   ALBUMIN 2.8* 07/30/2014 0954   AST 20 07/30/2014 0954   ALT 15 07/30/2014 0954   ALKPHOS 95 07/30/2014 0954   BILITOT 0.2* 07/30/2014 0954   GFRNONAA 75* 07/31/2014 0500   GFRAA 87* 07/31/2014 0500     Time In Time Out Total Time Spent with Patient Total Overall Time  1450 1600 39mn 738m    Greater than 50%  of this time was spent counseling and coordinating care related to the above assessment and plan.   AlVinie SillNP Palliative Medicine Team Pager # [phone]-F 8a-5p) Team Phone # [phone]/Weekends)

## 2014-07-31 NOTE — Progress Notes (Signed)
Rehab Admissions Coordinator Note:  Patient was screened by Cortne Amara L for appropriateness for an Inpatient Acute Rehab Consult.  At this time, we are recommending Inpatient Rehab consult.  Arisha Gervais, PT Rehabilitation Admissions Coordinator [phone]  

## 2014-07-31 NOTE — Progress Notes (Signed)
Stroke Team Progress Note  HISTORY [name] is an 80 y.o. male who was recently hospitalized for UTI and generalized weakness. Patient was discharged to rehab facility and was doing well. He was last seen in the evening 07/29/2014 at 2030 hours by his wife. This am 07/30/2014 he was found very lethargic and dysarthric with the inability to move his left arm and leg. He was brought to ED where initial CT head was negative for acute blood. Neurology was called for recommendations. Patient was not administered TPA secondary to on Xarelto, outside time window. He was admitted for further evaluation and treatment.  SUBJECTIVE His wife and son are at the bedside.  Overall they feels his condition is stable from last night. They report a history of strokes, the first last August from which he recovered to play golf. After his 2 strokes in Jan of this year, he recovered enough to go home and be cared for by his wife. Following a medical hosptialization 1 week ago, he was discharged to rehab SNF (not CIR) as he did not have a stroke at that time. He has been at [facility]. Wife visits him daily. He has been gettting xarelto as scheduled; though often late at night, not dinner as she would give at home. They are concerned about his care and needs. His daughter-in-law is a Software engineer.  OBJECTIVE Most recent Vital Signs: Filed Vitals:   07/31/14 0300 07/31/14 0400 07/31/14 0445 07/31/14 0555  BP: 153/76 146/72  146/74  Pulse: 76 76  76  Temp: 97.8 F (36.6 C) 97.8 F (36.6 C)  97.1 F (36.2 C)  TempSrc: Oral Oral  Oral  Resp: 16 16  20   Height:      Weight:   52.5 kg (115 lb 11.9 oz)   SpO2: 98% 98%  100%   CBG (last 3)   Recent Labs  07/30/14 2346 07/31/14 0401 07/31/14 0818  GLUCAP 88 76 82    IV Fluid Intake:   . sodium chloride 50 mL/hr (07/31/14 0420)    MEDICATIONS  . amiodarone  200 mg Oral BID  . aspirin  300 mg Rectal Daily  . bacitracin-polymyxin b   Both Eyes 6 times per  day  . cholecalciferol  2,000 Units Oral Daily  . digoxin  0.125 mg Oral Daily  . diltiazem  180 mg Oral Daily  . docusate sodium  200 mg Oral BID  . feeding supplement (PRO-STAT SUGAR FREE 64)  30 mL Oral TID WC  . insulin aspart  0-15 Units Subcutaneous 6 times per day  . sodium chloride  3 mL Intravenous Q12H   PRN:  guaiFENesin-dextromethorphan, ondansetron (ZOFRAN) IV, ondansetron, senna-docusate  Diet:  NPO  Activity:  As tolerated DVT Prophylaxis:  SCDs  CLINICALLY SIGNIFICANT STUDIES Basic Metabolic Panel:  Recent Labs Lab 07/26/14 0700 07/30/14 0954 07/31/14 0500  NA  --  132* 135*  K  --  4.7 4.4  CL  --  93* 96  CO2  --  30 29  GLUCOSE  --  97 83  BUN  --  28* 22  CREATININE  --  1.01 0.94  CALCIUM  --  9.0 8.9  MG 1.9  --   --    Liver Function Tests:  Recent Labs Lab 07/30/14 0954  AST 20  ALT 15  ALKPHOS 95  BILITOT 0.2*  PROT 6.8  ALBUMIN 2.8*   CBC:  Recent Labs Lab 07/30/14 0954 07/31/14 0500  WBC 5.4 5.2  NEUTROABS 4.0  --   HGB 9.6* 9.5*  HCT 28.3* 29.3*  MCV 87.3 91.6  PLT 357 376   Coagulation:  Recent Labs Lab 07/30/14 0954  LABPROT 23.8*  INR 2.13*   Cardiac Enzymes: No results found for this basename: CKTOTAL, CKMB, CKMBINDEX, TROPONINI,  in the last 168 hours Urinalysis:  Recent Labs Lab 07/30/14 1045  COLORURINE RED*  LABSPEC 1.019  PHURINE 6.5  GLUCOSEU NEGATIVE  HGBUR LARGE*  BILIRUBINUR LARGE*  KETONESUR 15*  PROTEINUR 100*  UROBILINOGEN 1.0  NITRITE POSITIVE*  LEUKOCYTESUR LARGE*   Lipid Panel    Component Value Date/Time   CHOL 126 07/31/2014 0500   TRIG 40 07/31/2014 0500   HDL 42 07/31/2014 0500   CHOLHDL 3.0 07/31/2014 0500   VLDL 8 07/31/2014 0500   LDLCALC 76 07/31/2014 0500   HgbA1C  Lab Results  Component Value Date   HGBA1C 5.8* 12/29/2013    Urine Drug Screen:     Component Value Date/Time   LABOPIA NONE DETECTED 07/30/2014 1045   COCAINSCRNUR NONE DETECTED 07/30/2014 1045   LABBENZ NONE DETECTED  07/30/2014 1045   AMPHETMU NONE DETECTED 07/30/2014 1045   THCU NONE DETECTED 07/30/2014 1045   LABBARB NONE DETECTED 07/30/2014 1045    Alcohol Level:  Recent Labs Lab 07/30/14 0954  ETH <11    CT of the brain  07/30/2014    Chronic changes stable from recent MRI examination. No acute abnormality is noted.   MRI of the brain  07/30/2014    1. Small, acute infarct primarily involving the right corona radiata. 2. Remote infarcts and moderate chronic small vessel ischemic disease  MRA of the brain  07/30/2014     Moderately motion degraded MRA without large vessel occlusion. Proximal right M2 severe stenosis versus segmental occlusion.  Carotid Doppler  Right: 1-39% ICA stenosis. Left: 40-59% internal carotid artery stenosis. Bilateral: Vertebral artery flow is antegrade.   2D Echocardiogram  - The patient was in atrial fibrillation. Normal LV size and systolic function, EF 87-56%. Normal RV size and systolic function. Moderate biatrial enlargement. Mild aortic stenosis. Mild pulmonary hypertension.  CXR  07/30/2014  No acute abnormalities.  Severe emphysema.     EKG  normal sinus rhythm. For complete results please see formal report.   Therapy Recommendations CIR  Physical Exam   Physical exam  Temp:  [97.1 F (36.2 C)-98.1 F (36.7 C)] 97.9 F (36.6 C) (08/05 1844) Pulse Rate:  [66-99] 99 (08/05 1844) Resp:  [16-20] 20 (08/05 1844) BP: (112-153)/(51-76) 112/51 mmHg (08/05 1844) SpO2:  [98 %-100 %] 99 % (08/05 1844) Weight:  [115 lb 11.9 oz (52.5 kg)] 115 lb 11.9 oz (52.5 kg) (08/05 0445)  General - Well nourished, well developed, in no apparent distress.  Ophthalmologic - not able to see through.  Cardiovascular - irregularly irregular heart rate and rhythm.  Mental Status -  Awake alert, orientated to self, place and people but not to time. Language hypophonic, but seems intact on expression based on lip reading, also intact on comprehension. Not able to do naming or repetition.    Cranial Nerves II - XII - II - blink bilaterally to visual threat. III, IV, VI - Extraocular movements intact. V - Facial sensation intact bilaterally. VII - left facial droop. VIII - Hearing & vestibular intact bilaterally. X - Palate elevates symmetrically. XI - Chin turning & shoulder shrug intact bilaterally. XII - Tongue protrusion intact.  Motor Strength - The patient's strength was 0/5 left  UE and LE but withdraw to pain stimulation. RUE and RLE 5-/5. Bulk was normal and fasciculations were absent.   Motor Tone - Muscle tone was assessed at the neck and appendages and was normal.  Reflexes - The patient's reflexes were decreased in all extremities and he had left babinski.  Sensory - Light touch, temperature/pinprick were assessed and were normal.    Coordination - The patient had normal movements in right hand with no ataxia or dysmetria.  Tremor was absent.  Gait and Station - not tested.   ASSESSMENT [name] is a 80 y.o. male found by his wife to have increased lethargy, dysarthria and left sided weakness at his SNF. Imaging confirms a new small right corona radiata infarct. Infarct felt to be embolic secondary to known atrial fibrillation vs hypercoagulable state from known bladder cancer. No matter which condition, the treatment plan is the same. On xarelto ( rivaroxaban) prior to admission. Now on aspirin 300 suppository daily given NPO status for secondary stroke prevention. Patient with resultant left hemiparesis, dysphagia, dysarthria. Stroke work up underway.  atrial fibrillation Hypertension  LDL 76  Bladder cancer, 7-8 yrs, treated with experimental treatments by Dr. Tresa Endo at [facility], last treated in Aug 2014 Anemia of chronic disease  Severe protein calorie malnutrition, Body mass index is 17.08 kg/(m^2).   Recent hospitalization 07/25/2014 MRI negative, xarelto continued in setting of hematuria given atrial fibrillation   Hx  stroke  08/17/2014 small posterior superior right lenticular nucleus, posterior right corona radiata infarct. resultant slurred speech,dysphagia, no notable weakness. embolic secondary to atrial fibrillation. On warfarin prior to admission w/ INR 1.76, changed to xarelto  12/27/2013 high left parietal lobe infarct, incidental finding. embolic secondary to known atrial fibrillation. xarelto continued   01/05/2014 left PCA infarct - thalamus, occipital and temporal lobe. embolic secondary to known atrial fibrillation. xarelto continued  Hospital day # 1  TREATMENT/PLAN  Continue aspirin 300 mg suppository for secondary stroke prevention given NPO status. Once patient able to swallow, plan change to eliquis 2.5 mg bid. Dose adjusted for his low body weight and age more than 80 yo.  Agree primary team call for palliative consult to clear the goal of care.  Recommend low dose statin for stroke prevention.  ST follow swallow. For MBS today.  Rehab consult  Continue afib treatment as per primary team.  Burnetta Sabin, MSN, RN, ANVP-BC, ANP-BC, GNP-BC [facility] Pager: [phone] 07/31/2014 3:00 PM  I, the attending vascular neurologist, have personally obtained a history, examined the patient, evaluated laboratory data, individually viewed imaging studies, and formulated the assessment and plan of care.  I have made any additions or clarifications directly to the above note and agree with the findings and plan as currently documented.   SIGNED Rosalin Hawking, MD PhD Stroke Neurology 07/31/2014 7:00 PM  To contact Stroke Continuity provider, please refer to [email or web address] After hours, contact General Neurology

## 2014-07-31 NOTE — Progress Notes (Signed)
VASCULAR LAB PRELIMINARY  PRELIMINARY  PRELIMINARY  PRELIMINARY  Carotid duplex  completed.    Preliminary report:  Right:  1-39% ICA stenosis.  Left:  40-59% internal carotid artery stenosis.  Bilateral:  Vertebral artery flow is antegrade.      Judeth Gilles, RVT 07/31/2014, 3:11 PM

## 2014-07-31 NOTE — Consult Note (Signed)
Physical Medicine and Rehabilitation Consult  Reason for Consult: Left sided weakness, difficulty speaking, dysphagia,  Referring Physician:  Dr. Erlinda Hong   HPI: [name] is a 80 y.o. male with history of bladder CA with intermittent hematuria, A fib--chronic xarelto, multiple prior CVA, recent admission for UTI who was discharged to SNF on 07/26/14. He was up walking with PT and last seen normal 07/29/14. On am of 07/30/14, he was found to be lethargic with left sided weakness and severely dysarthric speech. MRI brain with small acute infarct involving right corona radiata and remote infarcts with SVD. Work up in progress. Infarct felt to be embolic secondary to A fib v/s hypercoagulable state from known bladder cancer. Carotid dopplers with L-40-59% ICA stenosis.   MBS done today and patient started on puree diet with pudding thick liquids by spoon.  Therapies initiated and CIR recommended by rehab team and MD.    Review of Systems  Unable to perform ROS: language     Past Medical History  Diagnosis Date  . Bladder cancer     a. Dr. Lawerance Bach - [facility]. Multiple surgeries, chemo trial.  . Hyperlipidemia   . Anemia 04/10/2009  . Immune thrombocytopenic purpura     a. s/p splenectomy.  Marland Kitchen Anxiety   . Hypertension   . Permanent atrial fibrillation     a. Historically difficult to control rates  . COPD   . TIA (transient ischemic attack)     a. H/o TIA 2007.  . Stroke     a. H/o remote lacunar infarcts, at least 3 of them. b. Right lenticular nucleus and corona radiata infarct, cardioembolic 03/3153. c. Stroke 11/2013, new infarct 2 weeks later 12/2013.  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 Gross hematuria     a. H/o bladder CA with intermittent gross hematuria.  Marland Kitchen Dysphagia   . Anemia     Past Surgical History  Procedure Laterality Date  . Splenectomy    . Bilateral vats ablation    .  Facial cancer      facial skin cancer   Family History  Problem Relation Age of Onset  . Heart disease Mother   . Arthritis Father     Social History:   Married. Needed supervision with mobility and ADLs. Wife supportive and provides 24 hour care. Per reports that he has never smoked. He has never used smokeless tobacco. Per reports that he does not drink alcohol or use illicit drugs.   Allergies  Allergen Reactions  . Azithromycin Other (See Comments)    Altered mental status  . Diazepam Other (See Comments)    REACTION: agitation  . Ezetimibe-Simvastatin Other (See Comments)    unknown  . Morphine Other (See Comments)    Hallucinations.  . Prednisone Other (See Comments)    Dizziness  . Sulfonamide Derivatives Hives   Medications Prior to Admission  Medication Sig Dispense Refill  . amiodarone (PACERONE) 200 MG tablet Take 1 tablet (200 mg total) by mouth 2 (two) times daily. Present dose for 2 weeks then 200 mg daily after 2 weeks.      . bacitracin-polymyxin b (POLYSPORIN) ophthalmic ointment Place into both eyes every 4 (four) hours. apply to eye every 12 hours while awake  3.5 g  0  . Cholecalciferol (VITAMIN D) 2000 UNITS tablet Take 2,000 Units by mouth daily.       Marland Kitchen  digoxin (LANOXIN) 0.125 MG tablet Take 1 tablet (0.125 mg total) by mouth daily.  30 tablet  0  . diltiazem (CARDIZEM CD) 180 MG 24 hr capsule Take 1 capsule (180 mg total) by mouth daily.  30 capsule  0  . docusate sodium 100 MG CAPS Take 200 mg by mouth 2 (two) times daily.  10 capsule  0  . Rivaroxaban (XARELTO) 15 MG TABS tablet Take 15 mg by mouth daily.        Home: Home Living Family/patient expects to be discharged to:: Inpatient rehab Living Arrangements: Spouse/significant other Available Help at Discharge: Family Type of Home: Other(Comment) (pt had just been admitted to SNF for short-term rehab) Additional Comments: Pt was living at home until last week after recent admission to [facility]    Functional History: Prior Function Level of Independence: Needs assistance ADL's / Homemaking Assistance Needed: wife (A) with adls at baseline PTA.  Functional Status:  Mobility: Bed Mobility Overal bed mobility: Needs Assistance Bed Mobility: Supine to Sit Rolling: Max assist General bed mobility comments: pt initated with RT UE adn LE        ADL: ADL Overall ADL's : Needs assistance/impaired Eating/Feeding: NPO Grooming: Maximal assistance;Sitting;Wash/dry face Grooming Details (indicate cue type and reason): pt with poor lip closer and drooling with static sitting  Lower Body Bathing: Maximal assistance;Bed level Lower Body Bathing Details (indicate cue type and reason): pt attempting peri care with RT hand supine. pt initiated and sustained attention to task Upper Body Dressing : Maximal assistance;Bed level Lower Body Dressing: Total assistance;Bed level Lower Body Dressing Details (indicate cue type and reason): pt lifting Rt LE and iniitated movement with LT LE. pt unable to lift off bed surface General ADL Comments: Pt with condom cath off on arrival and session focused on bed level hygiene. pt sitting EOB for ~15 minutes with weight bearing LT UE. Pt remained in bed due to pending MBS with SLP immediately at end of session. Room setup for RN assist to chair after MBS with return to room  Cognition: Cognition Overall Cognitive Status: Impaired/Different from baseline Arousal/Alertness: Awake/alert Orientation Level: Oriented to person Attention: Sustained Sustained Attention: Impaired Sustained Attention Impairment: Verbal basic Memory: Impaired Memory Impairment: Decreased recall of new information Awareness: Impaired Awareness Impairment: Emergent impairment;Anticipatory impairment;Intellectual impairment Problem Solving: Impaired Problem Solving Impairment: Functional basic Safety/Judgment: Impaired Cognition Arousal/Alertness: Awake/alert Behavior During  Therapy: WFL for tasks assessed/performed Overall Cognitive Status: Impaired/Different from baseline Area of Impairment: Attention;Following commands;Awareness Current Attention Level: Sustained Following Commands: Follows one step commands with increased time Safety/Judgement: Decreased awareness of deficits Awareness: Emergent Problem Solving: Slow processing;Difficulty sequencing General Comments: pt attempting to place wash cloth in LT UE and demonstrates poor awarenss to LT deficits  Blood pressure 119/54, pulse 83, temperature 98.1 F (36.7 C), temperature source Oral, resp. rate 20, height 5\' 9"  (1.753 m), weight 52.5 kg (115 lb 11.9 oz), SpO2 100.00%. Physical Exam  Nursing note and vitals reviewed. Constitutional: He appears cachectic. Nasal cannula in place.  Frail appearing male.  HENT:  Head: Normocephalic and atraumatic.  Eyes: Pupils are equal, round, and reactive to light. Right eye exhibits chemosis and discharge. Left eye exhibits discharge.  Neck: Normal range of motion. Neck supple.  Cardiovascular: Normal rate and regular rhythm.   Respiratory: Effort normal and breath sounds normal. No respiratory distress. He has no wheezes.  GI: Soft. Bowel sounds are normal. He exhibits no distension. There is no tenderness.  Neurological: He is alert.  Oriented to self only. Soft voice with stuttering, very dysarthric speech. Weak wet sounding cough. Able to follow one step commands without difficulty. Left facial weakness. Left sided weakness: 0/5 prox to distal LUE and 0/5 LLE prox to distal. Does withdraw to pain stimulation in left arm/hand. Aware of surroundings to a certain extent.   Skin: Skin is warm and dry.  Psychiatric: His affect is blunt. His speech is delayed and slurred. He is slowed. He exhibits abnormal recent memory and abnormal remote memory.    Results for orders placed during the hospital encounter of 07/30/14 (from the past 24 hour(s))  GLUCOSE, CAPILLARY      Status: None   Collection Time    07/30/14  8:25 PM      Result Value Ref Range   Glucose-Capillary 92  70 - 99 mg/dL   Comment 1 Documented in Chart     Comment 2 Notify RN    GLUCOSE, CAPILLARY     Status: None   Collection Time    07/30/14 11:46 PM      Result Value Ref Range   Glucose-Capillary 88  70 - 99 mg/dL   Comment 1 Documented in Chart     Comment 2 Notify RN    GLUCOSE, CAPILLARY     Status: None   Collection Time    07/31/14  4:01 AM      Result Value Ref Range   Glucose-Capillary 76  70 - 99 mg/dL   Comment 1 Documented in Chart     Comment 2 Notify RN    HEMOGLOBIN A1C     Status: Abnormal   Collection Time    07/31/14  5:00 AM      Result Value Ref Range   Hemoglobin A1C 5.9 (*) <5.7 %   Mean Plasma Glucose 123 (*) <117 mg/dL  BASIC METABOLIC PANEL     Status: Abnormal   Collection Time    07/31/14  5:00 AM      Result Value Ref Range   Sodium 135 (*) 137 - 147 mEq/L   Potassium 4.4  3.7 - 5.3 mEq/L   Chloride 96  96 - 112 mEq/L   CO2 29  19 - 32 mEq/L   Glucose, Bld 83  70 - 99 mg/dL   BUN 22  6 - 23 mg/dL   Creatinine, Ser 0.94  0.50 - 1.35 mg/dL   Calcium 8.9  8.4 - 10.5 mg/dL   GFR calc non Af Amer 75 (*) >90 mL/min   GFR calc Af Amer 87 (*) >90 mL/min   Anion gap 10  5 - 15  CBC     Status: Abnormal   Collection Time    07/31/14  5:00 AM      Result Value Ref Range   WBC 5.2  4.0 - 10.5 K/uL   RBC 3.20 (*) 4.22 - 5.81 MIL/uL   Hemoglobin 9.5 (*) 13.0 - 17.0 g/dL   HCT 29.3 (*) 39.0 - 52.0 %   MCV 91.6  78.0 - 100.0 fL   MCH 29.7  26.0 - 34.0 pg   MCHC 32.4  30.0 - 36.0 g/dL   RDW 14.2  11.5 - 15.5 %   Platelets 376  150 - 400 K/uL  LIPID PANEL     Status: None   Collection Time    07/31/14  5:00 AM      Result Value Ref Range   Cholesterol 126  0 - 200 mg/dL   Triglycerides  40  <150 mg/dL   HDL 42  >39 mg/dL   Total CHOL/HDL Ratio 3.0     VLDL 8  0 - 40 mg/dL   LDL Cholesterol 76  0 - 99 mg/dL  GLUCOSE, CAPILLARY     Status:  None   Collection Time    07/31/14  8:18 AM      Result Value Ref Range   Glucose-Capillary 82  70 - 99 mg/dL   Comment 1 Notify RN     Comment 2 Documented in Chart    GLUCOSE, CAPILLARY     Status: None   Collection Time    07/31/14 12:16 PM      Result Value Ref Range   Glucose-Capillary 91  70 - 99 mg/dL   Comment 1 Notify RN     Comment 2 Documented in Chart     Dg Chest 2 View  07/30/2014   CLINICAL DATA:  Acute stroke.  EXAM: CHEST  2 VIEW  COMPARISON:  07/20/2014 and 01/06/2014  FINDINGS: There is severe emphysema. There is benign calcification in the right midzone, stable. Heart size and vascularity are normal. No acute osseous abnormalities. Diffuse osteopenia. Chronic by a lateral apical pleural thickening, left greater than right.  IMPRESSION: No acute abnormalities.  Severe emphysema.   Electronically Signed   By: Rozetta Nunnery M.D.   On: 07/30/2014 17:21   Ct Head Wo Contrast  07/30/2014   CLINICAL DATA:  Slurred speech  EXAM: CT HEAD WITHOUT CONTRAST  TECHNIQUE: Contiguous axial images were obtained from the base of the skull through the vertex without intravenous contrast.  COMPARISON:  07/25/2014  FINDINGS: The bony calvarium is intact. Atrophic changes are noted. A stable left occipital infarct is noted when compared with the prior exam. Scattered lacunar infarcts are noted within the deep white matter bilaterally and also stable. Chronic white matter ischemic changes noted. No acute hemorrhage, acute infarction or space-occupying mass lesion is identified.  IMPRESSION: Chronic changes stable from recent MRI examination. No acute abnormality is noted.   Electronically Signed   By: Inez Catalina M.D.   On: 07/30/2014 10:00   Mr Brain Wo Contrast  07/30/2014   CLINICAL DATA:  New left-sided weakness. Straight. History of multiple prior strokes. Atrial fibrillation.  EXAM: MRI HEAD WITHOUT CONTRAST  MRA HEAD WITHOUT CONTRAST  TECHNIQUE: Multiplanar, multiecho pulse sequences of the  brain and surrounding structures were obtained without intravenous contrast. Angiographic images of the head were obtained using MRA technique without contrast.  COMPARISON:  Head CT 07/30/2014. Head MRI 07/25/2014. Head MRA 08/17/2012.  FINDINGS: MRI HEAD FINDINGS  Images are mildly degraded by motion.  There is a small, acute infarct centered in the posterior right corona radiata with a small amount of extension into the posterior aspects of the external and internal capsules. T2 hyperintensities in the periventricular white matter and pons are similar to the prior MRI and nonspecific but compatible with moderate chronic small vessel ischemic disease. A remote left occipital lobe infarct is again seen. Small, remote infarcts are again seen in the bilateral corona radiata, basal ganglia, thalami, and cerebellum. There is moderate cerebral atrophy. There is no evidence of intracranial hemorrhage, mass, midline shift, or extra-axial fluid collection.  Orbits are unremarkable. Paranasal sinuses are clear. There is a small right mastoid effusion. Major intracranial vascular flow voids are preserved.  MRA HEAD FINDINGS  Images are moderately degraded by motion.  Visualized distal vertebral arteries are patent with the left being dominant.  PICA origins are patent. SCA origins are patent. Mild irregularity is noted of the basilar artery without significant stenosis. There are patent bilateral posterior communicating arteries, with a fetal type origin of the left PCA. There is no evidence of high-grade proximal PCA stenosis. Moderate bilateral PCA branch vessel irregularity is noted.  Mild ectasia is again noted at the distal cervical left ICA. Internal carotid arteries are patent from skullbase to carotid termini and again demonstrate mild ectasia. Bilateral M1 segments are patent without evidence of high-grade stenosis. MCA bifurcations and proximal M2 segments are poorly evaluated due to motion, particularly on the  right. There is diminished flow related enhancement proximally in the right M2 superior division, suggestive of either severe stenosis or segmental occlusion, with some diminished flow present in more distal right MCA branches compared to the left. Left M2 branches appear grossly patent, although evaluation for stenosis is limited by motion. There is no proximal ACA stenosis. ACA branch vessel irregularity is noted bilaterally.  IMPRESSION: 1. Small, acute infarct primarily involving the right corona radiata. 2. Remote infarcts and moderate chronic small vessel ischemic disease as above. 3. Moderately motion degraded MRA without large vessel occlusion. Proximal right M2 severe stenosis versus segmental occlusion.   Electronically Signed   By: Logan Bores   On: 07/30/2014 17:38     Assessment/Plan: Diagnosis: embolic right brain infarct 1. Does the need for close, 24 hr/day medical supervision in concert with the patient's rehab needs make it unreasonable for this patient to be served in a less intensive setting? Yes 2. Co-Morbidities requiring supervision/potential complications: htn, bladder cancer, copd, afib 3. Due to bladder management, bowel management, safety, skin/wound care, disease management, medication administration, pain management and patient education, does the patient require 24 hr/day rehab nursing? Yes 4. Does the patient require coordinated care of a physician, rehab nurse, PT (1-2 hrs/day, 5 days/week), OT (1-2 hrs/day, 5 days/week) and SLP (1-2 hrs/day, 5 days/week) to address physical and functional deficits in the context of the above medical diagnosis(es)? Yes Addressing deficits in the following areas: balance, endurance, locomotion, strength, transferring, bowel/bladder control, bathing, dressing, feeding, grooming, toileting, cognition, speech, language, swallowing and psychosocial support 5. Can the patient actively participate in an intensive therapy program of at least 3 hrs  of therapy per day at least 5 days per week? Yes and Potentially 6. The potential for patient to make measurable gains while on inpatient rehab is good 7. Anticipated functional outcomes upon discharge from inpatient rehab are min assist  with PT, min assist with OT, min assist and mod assist with SLP. 8. Estimated rehab length of stay to reach the above functional goals is: 20-28 days 9. Does the patient have adequate social supports to accommodate these discharge functional goals? Yes 10. Anticipated D/C setting: Home 11. Anticipated post D/C treatments: HH therapy and Outpatient therapy 12. Overall Rehab/Functional Prognosis: excellent  RECOMMENDATIONS: This patient's condition is appropriate for continued rehabilitative care in the following setting: CIR Patient has agreed to participate in recommended program. Yes Note that insurance prior authorization may be required for reimbursement for recommended care.  Comment: Supportive family. Pt progressed nicely on inpatient rehab before and has potential to do so again. Rehab Admissions Coordinator to follow up.  Thanks,  Meredith Staggers, MD, Mellody Drown     07/31/2014

## 2014-07-31 NOTE — Progress Notes (Signed)
[facility] PROGRESS NOTE Assessment/Plan: CVA (cerebral infarction) - MRI showed as below. appriciate neurology assistance. - A1c pending, cont xarelto. - PT/OT consult pending, carotid doppler, echo pending. No events on telemetry. - will talk with son and wife about PMT.    Protein-calorie malnutrition, severe  Chronic Atrial fibrillation - Rate controlled, currently on combination of amiodarone, digoxin, Cardizem and xaralto   UTI (lower urinary tract infection): - afebrile, no leukocytosis no abd tenderness. - previous UC showed 45,000 colonies. - d/c antibiotics. Hematuria due to bladder cancer and being Xarelto.  Anemia of chronic disease: - Stable no acute issues.   Suspected conjunctivitis: - Finishing antibiotic eye ointment treatment  Severe protein calorie malnutrition: - Place on protein supplementation.  Essential HTN: -  Stable cont diltiazem.  Code Status: DNR Family Communication: none  Disposition Plan: inpatient.   Consultants:  Neurology  Procedures: MRI/MRA 8.4.2015: Small, acute infarct primarily involving the right corona radiata. Remote infarcts and moderate chronic small vessel ischemic disease as above. Moderately motion degraded MRA without large vessel occlusion. Proximal right M2 severe stenosis versus segmental occlusion.   Antibiotics:  None  HPI/Subjective: No complains,   Objective: Filed Vitals:   07/31/14 0300 07/31/14 0400 07/31/14 0445 07/31/14 0555  BP: 153/76 146/72  146/74  Pulse: 76 76  76  Temp: 97.8 F (36.6 C) 97.8 F (36.6 C)  97.1 F (36.2 C)  TempSrc: Oral Oral  Oral  Resp: 16 16  20   Height:      Weight:   52.5 kg (115 lb 11.9 oz)   SpO2: 98% 98%  100%    Intake/Output Summary (Last 24 hours) at 07/31/14 0839 Last data filed at 07/31/14 6213  Gross per 24 hour  Intake      0 ml  Output    850 ml  Net   -850 ml   Filed Weights   07/30/14 1348 07/31/14 0445  Weight: 53.071 kg (117 lb)  52.5 kg (115 lb 11.9 oz)    Exam:  General: Alert, awake, oriented x1, in no acute distress.  HEENT: No bruits, no goiter.  Heart: IRegular rate and rhythm. Lungs: Good air movement, clear Abdomen: Soft, nontender. Neuro: left side weakness   Data Reviewed: Basic Metabolic Panel:  Recent Labs Lab 07/26/14 0700 07/30/14 0954 07/31/14 0500  NA  --  132* 135*  K  --  4.7 4.4  CL  --  93* 96  CO2  --  30 29  GLUCOSE  --  97 83  BUN  --  28* 22  CREATININE  --  1.01 0.94  CALCIUM  --  9.0 8.9  MG 1.9  --   --    Liver Function Tests:  Recent Labs Lab 07/30/14 0954  AST 20  ALT 15  ALKPHOS 95  BILITOT 0.2*  PROT 6.8  ALBUMIN 2.8*   No results found for this basename: LIPASE, AMYLASE,  in the last 168 hours No results found for this basename: AMMONIA,  in the last 168 hours CBC:  Recent Labs Lab 07/30/14 0954 07/31/14 0500  WBC 5.4 5.2  NEUTROABS 4.0  --   HGB 9.6* 9.5*  HCT 28.3* 29.3*  MCV 87.3 91.6  PLT 357 376   Cardiac Enzymes: No results found for this basename: CKTOTAL, CKMB, CKMBINDEX, TROPONINI,  in the last 168 hours BNP (last 3 results) No results found for this basename: PROBNP,  in the last 8760 hours CBG:  Recent Labs Lab 07/30/14 2025  07/30/14 2346 07/31/14 0401 07/31/14 0818  GLUCAP 92 88 76 82    No results found for this or any previous visit (from the past 240 hour(s)).   Studies: Dg Chest 2 View  07/30/2014   CLINICAL DATA:  Acute stroke.  EXAM: CHEST  2 VIEW  COMPARISON:  07/20/2014 and 01/06/2014  FINDINGS: There is severe emphysema. There is benign calcification in the right midzone, stable. Heart size and vascularity are normal. No acute osseous abnormalities. Diffuse osteopenia. Chronic by a lateral apical pleural thickening, left greater than right.  IMPRESSION: No acute abnormalities.  Severe emphysema.   Electronically Signed   By: Rozetta Nunnery M.D.   On: 07/30/2014 17:21   Ct Head Wo Contrast  07/30/2014   CLINICAL  DATA:  Slurred speech  EXAM: CT HEAD WITHOUT CONTRAST  TECHNIQUE: Contiguous axial images were obtained from the base of the skull through the vertex without intravenous contrast.  COMPARISON:  07/25/2014  FINDINGS: The bony calvarium is intact. Atrophic changes are noted. A stable left occipital infarct is noted when compared with the prior exam. Scattered lacunar infarcts are noted within the deep white matter bilaterally and also stable. Chronic white matter ischemic changes noted. No acute hemorrhage, acute infarction or space-occupying mass lesion is identified.  IMPRESSION: Chronic changes stable from recent MRI examination. No acute abnormality is noted.   Electronically Signed   By: Inez Catalina M.D.   On: 07/30/2014 10:00   Mr Brain Wo Contrast  07/30/2014   CLINICAL DATA:  New left-sided weakness. Straight. History of multiple prior strokes. Atrial fibrillation.  EXAM: MRI HEAD WITHOUT CONTRAST  MRA HEAD WITHOUT CONTRAST  TECHNIQUE: Multiplanar, multiecho pulse sequences of the brain and surrounding structures were obtained without intravenous contrast. Angiographic images of the head were obtained using MRA technique without contrast.  COMPARISON:  Head CT 07/30/2014. Head MRI 07/25/2014. Head MRA 08/17/2012.  FINDINGS: MRI HEAD FINDINGS  Images are mildly degraded by motion.  There is a small, acute infarct centered in the posterior right corona radiata with a small amount of extension into the posterior aspects of the external and internal capsules. T2 hyperintensities in the periventricular white matter and pons are similar to the prior MRI and nonspecific but compatible with moderate chronic small vessel ischemic disease. A remote left occipital lobe infarct is again seen. Small, remote infarcts are again seen in the bilateral corona radiata, basal ganglia, thalami, and cerebellum. There is moderate cerebral atrophy. There is no evidence of intracranial hemorrhage, mass, midline shift, or  extra-axial fluid collection.  Orbits are unremarkable. Paranasal sinuses are clear. There is a small right mastoid effusion. Major intracranial vascular flow voids are preserved.  MRA HEAD FINDINGS  Images are moderately degraded by motion.  Visualized distal vertebral arteries are patent with the left being dominant. PICA origins are patent. SCA origins are patent. Mild irregularity is noted of the basilar artery without significant stenosis. There are patent bilateral posterior communicating arteries, with a fetal type origin of the left PCA. There is no evidence of high-grade proximal PCA stenosis. Moderate bilateral PCA branch vessel irregularity is noted.  Mild ectasia is again noted at the distal cervical left ICA. Internal carotid arteries are patent from skullbase to carotid termini and again demonstrate mild ectasia. Bilateral M1 segments are patent without evidence of high-grade stenosis. MCA bifurcations and proximal M2 segments are poorly evaluated due to motion, particularly on the right. There is diminished flow related enhancement proximally in the right M2 superior  division, suggestive of either severe stenosis or segmental occlusion, with some diminished flow present in more distal right MCA branches compared to the left. Left M2 branches appear grossly patent, although evaluation for stenosis is limited by motion. There is no proximal ACA stenosis. ACA branch vessel irregularity is noted bilaterally.  IMPRESSION: 1. Small, acute infarct primarily involving the right corona radiata. 2. Remote infarcts and moderate chronic small vessel ischemic disease as above. 3. Moderately motion degraded MRA without large vessel occlusion. Proximal right M2 severe stenosis versus segmental occlusion.   Electronically Signed   By: Logan Bores   On: 07/30/2014 17:38   Mr Jodene Nam Head/brain Wo Cm  07/30/2014   CLINICAL DATA:  New left-sided weakness. Straight. History of multiple prior strokes. Atrial fibrillation.   EXAM: MRI HEAD WITHOUT CONTRAST  MRA HEAD WITHOUT CONTRAST  TECHNIQUE: Multiplanar, multiecho pulse sequences of the brain and surrounding structures were obtained without intravenous contrast. Angiographic images of the head were obtained using MRA technique without contrast.  COMPARISON:  Head CT 07/30/2014. Head MRI 07/25/2014. Head MRA 08/17/2012.  FINDINGS: MRI HEAD FINDINGS  Images are mildly degraded by motion.  There is a small, acute infarct centered in the posterior right corona radiata with a small amount of extension into the posterior aspects of the external and internal capsules. T2 hyperintensities in the periventricular white matter and pons are similar to the prior MRI and nonspecific but compatible with moderate chronic small vessel ischemic disease. A remote left occipital lobe infarct is again seen. Small, remote infarcts are again seen in the bilateral corona radiata, basal ganglia, thalami, and cerebellum. There is moderate cerebral atrophy. There is no evidence of intracranial hemorrhage, mass, midline shift, or extra-axial fluid collection.  Orbits are unremarkable. Paranasal sinuses are clear. There is a small right mastoid effusion. Major intracranial vascular flow voids are preserved.  MRA HEAD FINDINGS  Images are moderately degraded by motion.  Visualized distal vertebral arteries are patent with the left being dominant. PICA origins are patent. SCA origins are patent. Mild irregularity is noted of the basilar artery without significant stenosis. There are patent bilateral posterior communicating arteries, with a fetal type origin of the left PCA. There is no evidence of high-grade proximal PCA stenosis. Moderate bilateral PCA branch vessel irregularity is noted.  Mild ectasia is again noted at the distal cervical left ICA. Internal carotid arteries are patent from skullbase to carotid termini and again demonstrate mild ectasia. Bilateral M1 segments are patent without evidence of  high-grade stenosis. MCA bifurcations and proximal M2 segments are poorly evaluated due to motion, particularly on the right. There is diminished flow related enhancement proximally in the right M2 superior division, suggestive of either severe stenosis or segmental occlusion, with some diminished flow present in more distal right MCA branches compared to the left. Left M2 branches appear grossly patent, although evaluation for stenosis is limited by motion. There is no proximal ACA stenosis. ACA branch vessel irregularity is noted bilaterally.  IMPRESSION: 1. Small, acute infarct primarily involving the right corona radiata. 2. Remote infarcts and moderate chronic small vessel ischemic disease as above. 3. Moderately motion degraded MRA without large vessel occlusion. Proximal right M2 severe stenosis versus segmental occlusion.   Electronically Signed   By: Logan Bores   On: 07/30/2014 17:38    Scheduled Meds: . amiodarone  200 mg Oral BID  . aspirin  300 mg Rectal Daily  . bacitracin-polymyxin b   Both Eyes 6 times per day  .  cholecalciferol  2,000 Units Oral Daily  . digoxin  0.125 mg Oral Daily  . diltiazem  180 mg Oral Daily  . docusate sodium  200 mg Oral BID  . feeding supplement (PRO-STAT SUGAR FREE 64)  30 mL Oral TID WC  . insulin aspart  0-15 Units Subcutaneous 6 times per day  . sodium chloride  3 mL Intravenous Q12H   Continuous Infusions: . sodium chloride 50 mL/hr (07/31/14 0420)     Charlynne Cousins  [facility] Pager [phone]. If 8PM-8AM, please contact night-coverage at [email or web address], password [facility] 07/31/2014, 8:39 AM  LOS: 1 day      **Disclaimer: This note may have been dictated with voice recognition software. Similar sounding words can inadvertently be transcribed and this note may contain transcription errors which may not have been corrected upon publication of note.**

## 2014-07-31 NOTE — Clinical Documentation Improvement (Signed)
  H&P states on admit "unable to move left side" and "weakness left side" in this patient with Acute CVA. Please clarify  documentation to reflect severity of illness and risk of mortality. Thank you.  Possible Clinical Conditions? - Left hemiparesis   - Other Condition  Thank You, Ezekiel Ina ,RN Clinical Documentation Specialist:  [phone]  [place] [employer]

## 2014-07-31 NOTE — Progress Notes (Signed)
INITIAL NUTRITION ASSESSMENT  DOCUMENTATION CODES Per approved criteria  -Underweight   INTERVENTION: Provide Magic Cup TID with meals Inform nutritional service of pt's dietary preferences Discontinue Pro-stat RD to continue to monitor for PO adequacy  NUTRITION DIAGNOSIS: Inadequate oral intake related to dysphagia as evidenced by 9.5% weight loss in less than 6 months.   Goal: Pt to meet >/= 90% of their estimated nutrition needs   Monitor:  PO intake, weight trend, labs, diet advancement  Reason for Assessment: Malnutrition Screening Tool, score of 4  80 y.o. male  Admitting Dx: CVA (cerebral infarction)  ASSESSMENT: 80 y.o. male, with H/O multiple bilateral CVAs, at baseline left side stronger than right, atrial fibrillation on xaralto, hypertension, COPD, UTI, chronic intermittent hematuria, who was recently discharged to a nursing home from this facility after a UTI related admission comes from nursing home with dense left-sided weakness which is new.  Pt eating lunch at time of visit. Per family at bedside this is the first meal pt has had since Monday night. They report pt usually has a good appetite and eats well but, he does not eat well on the pureed foods diet. Weight history shows pt has lost 4% of his body weight in the past 3 months and 9.5% in the past 6 months.  Wife reports some of pt's favorite foods include, peaches, pears, pineapple, oatmeal with prunes, chicken/ Chix noodle soup, carrots, V8 juice and buttermilk.  Pt agreeable to trying creamy soups. He does not like Ensure Pudding.    Height: Ht Readings from Last 1 Encounters:  07/30/14 5\' 9"  (1.753 m)    Weight: Wt Readings from Last 1 Encounters:  07/31/14 115 lb 11.9 oz (52.5 kg)    Ideal Body Weight: 160 lbs  % Ideal Body Weight: 72%  Wt Readings from Last 10 Encounters:  07/31/14 115 lb 11.9 oz (52.5 kg)  07/29/14 117 lb (53.071 kg)  07/20/14 117 lb 4.6 oz (53.2 kg)  05/13/14 120 lb  9.6 oz (54.704 kg)  05/02/14 120 lb (54.432 kg)  03/28/14 120 lb (54.432 kg)  03/14/14 121 lb (54.885 kg)  03/01/14 122 lb (55.339 kg)  01/30/14 127 lb 13.9 oz (58 kg)  01/09/14 125 lb 3.2 oz (56.79 kg)    Usual Body Weight: 130 lbs  % Usual Body Weight: 88%  BMI:  Body mass index is 17.08 kg/(m^2). (Underweight)  Estimated Nutritional Needs: Kcal: 1600-1800 Protein: 75-85 grams Fluid: 1.6-1.8 L/day  Skin: intact  Diet Order: Dysphagia 1, pudding-thick  EDUCATION NEEDS: -No education needs identified at this time   Intake/Output Summary (Last 24 hours) at 07/31/14 1427 Last data filed at 07/31/14 0808  Gross per 24 hour  Intake      0 ml  Output    850 ml  Net   -850 ml    Last BM: 8/3  Labs:   Recent Labs Lab 07/26/14 0700 07/30/14 0954 07/31/14 0500  NA  --  132* 135*  K  --  4.7 4.4  CL  --  93* 96  CO2  --  30 29  BUN  --  28* 22  CREATININE  --  1.01 0.94  CALCIUM  --  9.0 8.9  MG 1.9  --   --   GLUCOSE  --  97 83    CBG (last 3)   Recent Labs  07/31/14 0401 07/31/14 0818 07/31/14 1216  GLUCAP 76 82 91    Scheduled Meds: . amiodarone  200 mg  Oral BID  . aspirin  300 mg Rectal Daily  . bacitracin-polymyxin b   Both Eyes 6 times per day  . cholecalciferol  2,000 Units Oral Daily  . digoxin  0.125 mg Oral Daily  . diltiazem  180 mg Oral Daily  . docusate sodium  200 mg Oral BID  . feeding supplement (PRO-STAT SUGAR FREE 64)  30 mL Oral TID WC  . insulin aspart  0-15 Units Subcutaneous 6 times per day  . sodium chloride  3 mL Intravenous Q12H    Continuous Infusions: . sodium chloride 50 mL/hr (07/31/14 0420)    Past Medical History  Diagnosis Date  . Bladder cancer     a. Dr. Lawerance Bach - [facility]. Multiple surgeries, chemo trial.  . Hyperlipidemia   . Anemia 04/10/2009  . Immune thrombocytopenic purpura     a. s/p splenectomy.  Marland Kitchen Anxiety   . Hypertension   . Permanent atrial fibrillation     a. Historically difficult to  control rates  . COPD   . TIA (transient ischemic attack)     a. H/o TIA 2007.  . Stroke     a. H/o remote lacunar infarcts, at least 3 of them. b. Right lenticular nucleus and corona radiata infarct, cardioembolic 0/1027. c. Stroke 11/2013, new infarct 2 weeks later 12/2013.  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 Gross hematuria     a. H/o bladder CA with intermittent gross hematuria.  Marland Kitchen Dysphagia   . Anemia     Past Surgical History  Procedure Laterality Date  . Splenectomy    . Bilateral vats ablation    . Facial cancer      facial skin cancer    Pryor Ochoa RD, LDN Inpatient Clinical Dietitian Pager: [phone] After Hours Pager: [phone]

## 2014-08-01 ENCOUNTER — Telehealth: Payer: Self-pay | Admitting: Cardiology

## 2014-08-01 DIAGNOSIS — J449 Chronic obstructive pulmonary disease, unspecified: Secondary | ICD-10-CM

## 2014-08-01 DIAGNOSIS — R319 Hematuria, unspecified: Secondary | ICD-10-CM

## 2014-08-01 LAB — GLUCOSE, CAPILLARY
Glucose-Capillary: 107 mg/dL — ABNORMAL HIGH (ref 70–99)
Glucose-Capillary: 113 mg/dL — ABNORMAL HIGH (ref 70–99)
Glucose-Capillary: 140 mg/dL — ABNORMAL HIGH (ref 70–99)

## 2014-08-01 MED ORDER — ATORVASTATIN CALCIUM 40 MG PO TABS
40.0000 mg | ORAL_TABLET | Freq: Every day | ORAL | Status: DC
  Administered 2014-08-01 – 2014-08-05 (×4): 40 mg via ORAL
  Filled 2014-08-01 (×4): qty 1

## 2014-08-01 NOTE — Progress Notes (Signed)
OT NOTE  08/01/14 1400  OT Visit Information  Last OT Received On 08/01/14  Assistance Needed +2  History of Present Illness  80 y.o. F admitted 07/30/14 c/o language impairment and confusion. MRI shows "patchy left MCA territory infarct" with resultant expressive and mild receptive aphasia and R hand hemiparesis. PMHx of HTN, smoking, and anxiety.   OT Time Calculation  OT Start Time 1314  OT Stop Time 1340  OT Time Calculation (min) 26 min  Precautions  Precautions Fall  Precaution Comments L sided weakness with L upper extremity flaccid at risk for subluxation  Pain Assessment  Pain Assessment No/denies pain (hunger)  Cognition  Arousal/Alertness Awake/alert  Behavior During Therapy Flat affect  Overall Cognitive Status Impaired/Different from baseline  Area of Impairment Attention;Following commands;Safety/judgement;Awareness;Problem solving  Current Attention Level Sustained  Following Commands Follows one step commands with increased time  Awareness Emergent  Problem Solving Slow processing;Decreased initiation  General Comments Pt reports "no" when asked to reach toward therapist glasses. Pt first states "i can't reach" pt then reports "they cost" Pt provided further encouragement and able to retrieve glasses from tech hair. Pt demonstrates incr trunk elongation and neck extension for task. Pt reports "hunger" at this time. Pt verbalized "bye" to therapist very loud and clear at end of session  ADL  Overall ADL's  Needs assistance/impaired  Eating/Feeding NPO  Eating/Feeding Details (indicate cue type and reason) wife reports pt coughing on oatmeal this AM  General ADL Comments Session focused on chair level static sitting balance and static standing. Pt demonstrates lateral LT lean with unsupported sitting. pt with pushing syndrome to the left. OT facilitated weight bearing on LT UE and LT LE. pt demonstates static standign for ~45-1 minutes with pad used to help with bil hip  extension and bil LE blocked. Pt static standing in a neutral position. Pt returned to chair sitting position. pt noted to have red urine   Bed Mobility  General bed mobility comments in chair on arrival  Balance  Overall balance assessment Needs assistance  Sitting-balance support Single extremity supported;Feet supported  Sitting balance-Leahy Scale Zero  Standing balance support Single extremity supported;During functional activity  Standing balance-Leahy Scale Zero  Transfers  Overall transfer level Needs assistance  Equipment used 2 person hand held assist  Transfers Sit to/from Stand  Sit to Stand +2 physical assistance;Max assist  General transfer comment Pt required facilitation at hip for extension and bil LE blocked   OT - End of Session  Equipment Utilized During Treatment Gait belt  Activity Tolerance Patient tolerated treatment well  Patient left in chair;with call bell/phone within reach;with family/visitor present  Nurse Communication Mobility status;Precautions  OT Assessment/Plan  OT Plan Discharge plan remains appropriate  OT Frequency Min 2X/week  Recommendations for Other Services Rehab consult  Follow Up Recommendations CIR  OT Equipment Other (comment)  OT Goal Progression  Progress towards OT goals Progressing toward goals  Acute Rehab OT Goals  Patient Stated Goal eat  OT Goal Formulation With patient/family  Time For Goal Achievement 08/14/14  Potential to Achieve Goals Good  ADL Goals  Pt Will Perform Grooming with min assist;sitting  Pt Will Perform Upper Body Bathing with mod assist;sitting  Pt Will Transfer to Toilet with mod assist;bedside commode  Additional ADL Goal #1 PT will complete bed mobility min (A) exiting to the Rt side with hob elevated  OT General Charges  $OT Visit 1 Procedure  OT Treatments  $Therapeutic Activity 23-37 mins  Jeri Modena   OTR/L Pager: [phone] Office: [phone] .

## 2014-08-01 NOTE — Consult Note (Signed)
I have reviewed and discussed case with Nurse Practitioner And agree with documentation and plan as noted above   Annaliese Saez J. Truc Winfree D.O.  Palliative Medicine Team at [place]  Team Phone: [phone]    

## 2014-08-01 NOTE — Progress Notes (Signed)
Rehab admissions - I have received a denial from [facility] for acute inpatient rehab admission.  If attending MD wished to pursue a peer to peer review, please contact me at [phone] and I can arrange it.  Case manager made aware of denial.  Call me for questions.  #[phone]

## 2014-08-01 NOTE — Evaluation (Signed)
Physical Therapy Evaluation Patient Details Name: [name] MRN: [id number] DOB: 1927-08-27 Today's Date: 08/01/2014   History of Present Illness  28 t.o. male admitted to [facility] on 07/30/14 from SNF where he was participating in rehab after recent admission on 7/31.  He presented with L sided weakness.  Stroke workup in progress.  MRI revealed right corona radiata infarct.  Pt with significant PMHx of anemia, HTN, A-fib, COPD, stroke/TIA, and bladder CA,.    Clinical Impression  Emphasized sitting and standing balance today, working on spinal extension, w/shifts and upright midline posture.  Pt could tolerate and benefit from CIR level therapies.    Follow Up Recommendations CIR    Equipment Recommendations  Wheelchair (measurements PT);Wheelchair cushion (measurements PT);Hospital bed;Other (comment)    Recommendations for Other Services Rehab consult     Precautions / Restrictions Precautions Precautions: Fall Precaution Comments: L UE relatively flaccid or low tone,  R side movement uncoordinated and with noticeable resistance to movement      Mobility  Bed Mobility Overal bed mobility: Needs Assistance Bed Mobility: Rolling;Sidelying to Sit Rolling: Max assist Sidelying to sit: Total assist       General bed mobility comments: pt initiated roll with v/t cues.  Consistent v/t cues to progress through task  Transfers Overall transfer level: Needs assistance Equipment used:  (tray table) Transfers: Sit to/from Stand Sit to Stand: Mod assist;+2 physical assistance (times 2) Stand pivot transfers: Max assist;+2 safety/equipment       General transfer comment: Much more able to be assisted forward and up.  L knee required consistent support and trunk needed max assist to attain upright stance.  Ambulation/Gait Ambulation/Gait assistance:  (not attempted today)           General Gait Details: unable today  Stairs            Wheelchair Mobility    Modified  Rankin (Stroke Patients Only) Modified Rankin (Stroke Patients Only) Pre-Morbid Rankin Score: Moderately severe disability Modified Rankin: Severe disability     Balance Overall balance assessment: Needs assistance Sitting-balance support: Single extremity supported Sitting balance-Leahy Scale: Zero Sitting balance - Comments: pt needed max assist to maintain upright sit at EOB with leaning posterior and to the Left.  No pushing noted today.  Worked on "sitting at attention:  which pt could attain with assist at lumbar area to sit up agains.  Sat EOB>10 min Postural control: Right lateral lean;Left lateral lean Standing balance support: Single extremity supported Standing balance-Leahy Scale: Zero Standing balance comment: stoof x2 at EOB to get Bil UE propped on elbows on a tray table.  L knee required blocking and upper trunk required a significant amount of extention assist.  Stood approx 20-30 secs each trial working to stand upright  and be able to weight shift.                             Pertinent Vitals/Pain Pain Assessment: No/denies pain    Home Living                        Prior Function                 Hand Dominance        Extremity/Trunk Assessment                         Communication  Cognition Arousal/Alertness: Awake/alert Behavior During Therapy: Flat affect;WFL for tasks assessed/performed Overall Cognitive Status: Impaired/Different from baseline Area of Impairment: Attention;Following commands;Awareness   Current Attention Level: Sustained   Following Commands: Follows one step commands with increased time Safety/Judgement: Decreased awareness of deficits Awareness: Emergent Problem Solving: Slow processing;Difficulty sequencing General Comments: Pt reports "no" when asked to reach toward therapist glasses. Pt first states "i can't reach" pt then reports "they cost" Pt provided further encouragement and  able to retrieve glasses from tech hair. Pt demonstrates incr trunk elongation and neck extension for task. Pt reports "hunger" at this time. Pt verbalized "bye" to therapist very loud and clear at end of session    General Comments      Exercises        Assessment/Plan    PT Assessment    PT Diagnosis     PT Problem List    PT Treatment Interventions     PT Goals (Current goals can be found in the Care Plan section) Acute Rehab PT Goals Patient Stated Goal: unable to state PT Goal Formulation: With patient/family Time For Goal Achievement: 08/14/14 Potential to Achieve Goals: Good    Frequency Min 4X/week   Barriers to discharge        Co-evaluation               End of Session   Activity Tolerance: Patient tolerated treatment well;Patient limited by fatigue Patient left: in chair;with call bell/phone within reach;with family/visitor present;Other (comment) (on lift pad) Nurse Communication: Mobility status;Need for lift equipment         Time: 9622-2979 PT Time Calculation (min): 27 min   Charges:     PT Treatments $Therapeutic Activity: 23-37 mins   PT G Codes:          [name], Tessie Fass 08/01/2014, 2:36 PM 08/01/2014  Donnella Sham, PT [phone]  (pager)

## 2014-08-01 NOTE — Progress Notes (Signed)
Progress Note from the Palliative Medicine Team at [facility]  [name] is more verbal this morning and more easily understood. He is answering most questions appropriately and following commands. [name] is at bedside and appreciates the conversation we were able to have yesterday with her and her son. She is very concerned with the state of his health the past 2 years but optimistic that [name] will do well in CIR as he has done in the past. She would like for him to come home but is unable to care for him unless he is fairly ambulatory requiring only contact assist. She knows his wishes are to be at home. He has also had his mother and brother die in nursing facilities and she believes this is a fear of his. I will continue to follow.  Vinie Sill, NP Palliative Medicine Team Pager # [phone] (M-F 8a-5p) Team Phone # [phone] (Nights/Weekends)

## 2014-08-01 NOTE — Telephone Encounter (Signed)
New problem   Pt's wife stated pt is in the hospital and they are putting him on some new meds that she want to talk to you about

## 2014-08-01 NOTE — Progress Notes (Addendum)
Stroke Team Progress Note  HISTORY [name] is an 80 y.o. male who was recently hospitalized for UTI and generalized weakness. Patient was discharged to rehab facility and was doing well. He was last seen in the evening 07/29/2014 at 2030 hours by his wife. This am 07/30/2014 he was found very lethargic and dysarthric with the inability to move his left arm and leg. He was brought to ED where initial CT head was negative for acute blood. Neurology was called for recommendations. Patient was not administered TPA secondary to on Xarelto, outside time window. He was admitted for further evaluation and treatment.  SUBJECTIVE No family present this am. Patient awake and sitting up in bed, alert. Much more verbal today, hypophonia much improved. However, pt was noticed to have chocking after eating. So he was put back on NPO status and pending further swallow evaluation. eliquis was given 3 doses so far and he tolerated well.  OBJECTIVE Most recent Vital Signs: Filed Vitals:   07/31/14 2120 08/01/14 0038 08/01/14 0546 08/01/14 0829  BP: 115/51 139/57 120/52 112/56  Pulse: 67 76 78 75  Temp: 98.2 F (36.8 C) 97.7 F (36.5 C) 98.4 F (36.9 C) 98 F (36.7 C)  TempSrc: Oral Oral Oral Oral  Resp: 20 20 20 20   Height:      Weight:   54.3 kg (119 lb 11.4 oz)   SpO2: 100% 98% 98% 99%   CBG (last 3)   Recent Labs  07/31/14 1705 07/31/14 1953 08/01/14 0037  GLUCAP 151* 152* 113*    IV Fluid Intake:   . sodium chloride 50 mL/hr (07/31/14 0420)    MEDICATIONS  . amiodarone  200 mg Oral BID  . apixaban  2.5 mg Oral BID  . bacitracin-polymyxin b   Both Eyes 6 times per day  . cholecalciferol  2,000 Units Oral Daily  . digoxin  0.125 mg Oral Daily  . diltiazem  180 mg Oral Daily  . docusate sodium  200 mg Oral BID  . sodium chloride  3 mL Intravenous Q12H   PRN:  food thickener, guaiFENesin-dextromethorphan, ondansetron (ZOFRAN) IV, ondansetron, RESOURCE THICKENUP CLEAR,  senna-docusate  Diet:  NPO  Activity:  As tolerated DVT Prophylaxis:  SCDs  CLINICALLY SIGNIFICANT STUDIES Basic Metabolic Panel:   Recent Labs Lab 07/26/14 0700 07/30/14 0954 07/31/14 0500  NA  --  132* 135*  K  --  4.7 4.4  CL  --  93* 96  CO2  --  30 29  GLUCOSE  --  97 83  BUN  --  28* 22  CREATININE  --  1.01 0.94  CALCIUM  --  9.0 8.9  MG 1.9  --   --    Liver Function Tests:   Recent Labs Lab 07/30/14 0954  AST 20  ALT 15  ALKPHOS 95  BILITOT 0.2*  PROT 6.8  ALBUMIN 2.8*   CBC:   Recent Labs Lab 07/30/14 0954 07/31/14 0500  WBC 5.4 5.2  NEUTROABS 4.0  --   HGB 9.6* 9.5*  HCT 28.3* 29.3*  MCV 87.3 91.6  PLT 357 376   Coagulation:   Recent Labs Lab 07/30/14 0954  LABPROT 23.8*  INR 2.13*   Cardiac Enzymes: No results found for this basename: CKTOTAL, CKMB, CKMBINDEX, TROPONINI,  in the last 168 hours Urinalysis:   Recent Labs Lab 07/30/14 1045  COLORURINE RED*  LABSPEC 1.019  PHURINE 6.5  GLUCOSEU NEGATIVE  HGBUR LARGE*  BILIRUBINUR LARGE*  KETONESUR [place]  100*  UROBILINOGEN 1.0  NITRITE POSITIVE*  LEUKOCYTESUR LARGE*   Lipid Panel    Component Value Date/Time   CHOL 126 07/31/2014 0500   TRIG 40 07/31/2014 0500   HDL 42 07/31/2014 0500   CHOLHDL 3.0 07/31/2014 0500   VLDL 8 07/31/2014 0500   LDLCALC 76 07/31/2014 0500   HgbA1C  Lab Results  Component Value Date   HGBA1C 5.9* 07/31/2014    Urine Drug Screen:     Component Value Date/Time   LABOPIA NONE DETECTED 07/30/2014 1045   COCAINSCRNUR NONE DETECTED 07/30/2014 1045   LABBENZ NONE DETECTED 07/30/2014 1045   AMPHETMU NONE DETECTED 07/30/2014 1045   THCU NONE DETECTED 07/30/2014 1045   LABBARB NONE DETECTED 07/30/2014 1045    Alcohol Level:   Recent Labs Lab 07/30/14 0954  ETH <11    CT of the brain  07/30/2014    Chronic changes stable from recent MRI examination. No acute abnormality is noted.   MRI of the brain  07/30/2014    1. Small, acute infarct primarily  involving the right corona radiata. 2. Remote infarcts and moderate chronic small vessel ischemic disease  MRA of the brain  07/30/2014     Moderately motion degraded MRA without large vessel occlusion. Proximal right M2 severe stenosis versus segmental occlusion.  Carotid Doppler  Right: 1-39% ICA stenosis. Left: 40-59% internal carotid artery stenosis. Bilateral: Vertebral artery flow is antegrade.   2D Echocardiogram  - The patient was in atrial fibrillation. Normal LV size and systolic function, EF 76-72%. Normal RV size and systolic function. Moderate biatrial enlargement. Mild aortic stenosis. Mild pulmonary hypertension.  CXR  07/30/2014  No acute abnormalities.  Severe emphysema.     EKG  normal sinus rhythm. For complete results please see formal report.   Therapy Recommendations CIR  Physical exam Temp:  [97.7 F (36.5 C)-98.4 F (36.9 C)] 98 F (36.7 C) (08/06 0829) Pulse Rate:  [66-99] 75 (08/06 0829) Resp:  [20] 20 (08/06 0829) BP: (112-139)/(51-64) 112/56 mmHg (08/06 0829) SpO2:  [98 %-100 %] 99 % (08/06 0829) Weight:  [54.3 kg (119 lb 11.4 oz)] 54.3 kg (119 lb 11.4 oz) (08/06 0546)  General - thin built, well developed, in no apparent distress.  Ophthalmologic - not able to see through.  Cardiovascular - irregularly irregular heart rate and rhythm.  Mental Status -  Awake alert, orientated to self, place and people but not to time. Language dysarthric, but intact expression and comprehension. Able to name and repeat.  Cranial Nerves II - XII - II - blink bilaterally to visual threat. III, IV, VI - Extraocular movements intact. V - Facial sensation intact bilaterally. VII - left facial droop. VIII - Hearing & vestibular intact bilaterally. X - Palate elevates symmetrically. XI - Chin turning & shoulder shrug intact bilaterally. XII - Tongue protrusion intact.  Motor Strength - The patient's strength was 0/5 left UE and 1/5 LE . RUE and RLE 5-/5. Bulk was  normal and fasciculations were absent.   Motor Tone - Muscle tone was assessed at the neck and appendages and was normal.  Reflexes - The patient's reflexes were decreased in all extremities and he had left babinski.  Sensory - Light touch, temperature/pinprick were assessed and were normal.    Coordination - The patient had normal movements in right hand with no ataxia or dysmetria.  Tremor was absent.  Gait and Station - not tested.   ASSESSMENT Mr. Clovis Mankins is a 80 y.o. male found  by his wife to have increased lethargy, dysarthria and left sided weakness at his SNF. Imaging confirms a new small right corona radiata infarct. Infarct felt to be embolic secondary to known atrial fibrillation vs hypercoagulable state from known bladder cancer. No matter which condition, the treatment plan is the same. On xarelto ( rivaroxaban) prior to admission. Now on eliquis (apixaban) for secondary stroke prevention. Patient with resultant left hemiparesis, dysphagia, dysarthria. Improvement in dysphagia over night.Stroke work up completed.  Chronic atrial fibrillation Hypertension  LDL 76  Bladder cancer, 7-8 yrs, treated with experimental treatments by Dr. Tresa Endo at [facility], last treated in Aug 2014 Anemia of chronic disease  Severe protein calorie malnutrition, Body mass index is 17.67 kg/(m^2).   Recent hospitalization 07/25/2014 MRI negative, xarelto continued in setting of hematuria given atrial fibrillation   Hx stroke  08/17/2014 small posterior superior right lenticular nucleus, posterior right corona radiata infarct. resultant slurred speech,dysphagia, no notable weakness. embolic secondary to atrial fibrillation. On warfarin prior to admission w/ INR 1.76, changed to xarelto  12/27/2013 high left parietal lobe infarct, incidental finding. embolic secondary to known atrial fibrillation. xarelto continued   01/05/2014 left PCA infarct - thalamus, occipital and temporal lobe. embolic  secondary to known atrial fibrillation. xarelto continued  Hospital day # 2  TREATMENT/PLAN  Continue eliquis (apixaban) for secondary stroke prevention. If pt has swallow difficulty and has to be in NPO status, continue ASA 300mg  suppository.  If pt swallow improved or placed on PEG tube, resume eliquis 2.5mg  bid.   Recommend low dose statin for stroke prevention.  Continue to follow up with oncology for bladder cancer management.  ST follow swallow.   Rehab following for discharge disposition - recommended for CIR  Palliative care consult addressed with family for goal of care.  No further stroke workup indicated.  Patient has a 10-15% risk of having another stroke over the next year, the highest risk is within 2 weeks of the most recent stroke/TIA (risk of having a stroke following a stroke or TIA is the same).  Ongoing risk factor control by Primary Care Physician  Stroke Service will sign off. Please call should any needs arise.  Follow up with Dr. Erlinda Hong, [facility], in 2 months.   Burnetta Sabin, MSN, RN, ANVP-BC, ANP-BC, [facility] Pager: [phone] 08/01/2014 10:05 AM  SIGNED I, the attending vascular neurologist, have personally obtained a history, examined the patient, evaluated laboratory data, individually viewed imaging studies, and formulated the assessment and plan of care.  I have made any additions or clarifications directly to the above note and agree with the findings and plan as currently documented.   Rosalin Hawking, MD PhD Stroke Neurology 08/01/2014 2:53 PM   To contact Stroke Continuity provider, please refer to [email or web address] After hours, contact General Neurology

## 2014-08-01 NOTE — Telephone Encounter (Signed)
No answer, will try again later.

## 2014-08-01 NOTE — Progress Notes (Signed)
Speech Language Pathology Treatment: Dysphagia;Cognitive-Linquistic  Patient Details Name: [name] MRN: [id number] DOB: 11-03-1927 Today's Date: 08/01/2014 Time: 8891-6945 SLP Time Calculation (min): 35 min  Assessment / Plan / Recommendation Clinical Impression  Orders received for reassessment of swallow after coughing noted by wife and RN at breakfast this morning. SLP provided skilled observation of Dys 1 textures with Max cues to "swallow hard." Pt with delayed cough x1 throughout container of applesauce and approximately 1/3 container of yogurt. MBS 8/5 with residuals noted particularly at the vallecular space, but with no penetration or aspiration of pureed consistency. Recommend to restart Dys 1 textures and pudding thick liquids with full supervision to cue for utilization of safe swallowing strategies.   Speech was noted to be mildly more intelligible today, with patient speaking primarily at the phrase level. SLP provided Max cues to be "loud and slow" to improve vocal intensity and pacing, in order to improve overall functional communication. Pt required Max cues for working memory, but demonstrated basic problem solving during functional, familiar task with Mod I. SLP provided Mod verbal cues for intellectual awareness to increase insight and safety. Continue plan of care.   HPI HPI: [name] is a 80 y.o. male, with H/O multiple bilateral CVAs, at baseline left side stronger than right, atrial fibrillation on xaralto, hypertension, COPD, UTI, chronic intermittent hematuria, who was recently discharged to a nursing home from this facility after a UTI related admission, Pt was admitted from [facility] 07/30/14 with slurred speech and new onset dense left-sided weakness. MRI revealed a small, acute infarct primarily involving the right corona radiata.   Pertinent Vitals Pain Assessment: No/denies pain  SLP Plan  Continue with current plan of care    Recommendations Diet recommendations:  Dysphagia 1 (puree);Pudding-thick liquid Liquids provided via: Teaspoon Medication Administration: Crushed with puree Supervision: Staff to assist with self feeding;Full supervision/cueing for compensatory strategies Compensations: Slow rate;Small sips/bites;Check for pocketing;Check for anterior loss;Multiple dry swallows after each bite/sip;Effortful swallow Postural Changes and/or Swallow Maneuvers: Seated upright 90 degrees;Upright 30-60 min after meal              Oral Care Recommendations: Oral care BID Follow up Recommendations: Inpatient Rehab;24 hour supervision/assistance Plan: Continue with current plan of care    GO      Germain Osgood, M.A. CCC-SLP [phone]  Germain Osgood 08/01/2014, 3:28 PM

## 2014-08-01 NOTE — Telephone Encounter (Signed)
Wife phoned regarding patient and is concerned about changes made in the hospital. They changed him to Eliquis , started him on Lipitor, and Amiodarone. She would like  Dr. Mare Ferrari to review and let her know if he agrees. Will forward to him for review.

## 2014-08-01 NOTE — Progress Notes (Addendum)
UR complete.  Karinne Schmader RN, MSN 

## 2014-08-01 NOTE — Progress Notes (Signed)
Paged MD, family members requesting to speak with him.

## 2014-08-01 NOTE — Progress Notes (Signed)
Rehab admissions - Evaluated for possible admission.  I have called and opened the case with [facility].  I have sent clinicals requesting acute inpatient rehab admission.  I will follow up once I hear back from the insurance case manager.  Call me for questions.  #[phone]

## 2014-08-01 NOTE — Progress Notes (Signed)
[facility] PROGRESS NOTE Assessment/Plan: CVA (cerebral infarction) - MRI showed as below. appriciate neurology assistance. - A1c 5.9, cont Eluquis - PT/OT consult rec CIR., carotid doppler, echo as below. No events on telemetry. - PMT meeting - Choking this am with food, repeat SLP. NPO.  Chronic Atrial fibrillation - Rate controlled, currently on combination of amiodarone, digoxin, Cardizem and Eluquis.  UTI (lower urinary tract infection): - afebrile, no leukocytosis no abd tenderness. - previous UC showed 45,000 colonies. - d/c antibiotics. Hematuria due to bladder cancer and being Xarelto.  Anemia of chronic disease: - Stable no acute issues.   Suspected conjunctivitis: - Finishing antibiotic eye ointment treatment  Severe protein calorie malnutrition: - Place on protein supplementation.  Essential HTN: -  Stable cont diltiazem.  Code Status: DNR Family Communication: none  Disposition Plan: inpatient.   Consultants:  Neurology  Procedures: MRI/MRA 8.4.2015: Small, acute infarct primarily involving the right corona radiata. Remote infarcts and moderate chronic small vessel ischemic disease as above. Moderately motion degraded MRA without large vessel occlusion. Proximal right M2 severe stenosis versus segmental occlusion. Carotid doppler 8.6.2015: -39% ICA stenosis. Left: 40-59% internal carotid artery stenosis. Echo estimated ejection fraction was in the range of 55% to 60%. Indeterminant diastolic function (atrial fibrillation). Wall motion was normal; there were no regional wall motion abnormalities. Aortic valve: Trileaflet; moderately calcified leaflets. There was mild stenosis. There was trivial regurgitation. Mean gradient (S): 10 mm Hg. Peak gradient (S): 21 mm Hg. Valve area (VTI): 1.7 cm^2.    Antibiotics:  None  HPI/Subjective: No complains,   Objective: Filed Vitals:   07/31/14 2120 08/01/14 0038 08/01/14 0546 08/01/14 0829  BP:  115/51 139/57 120/52 112/56  Pulse: 67 76 78 75  Temp: 98.2 F (36.8 C) 97.7 F (36.5 C) 98.4 F (36.9 C) 98 F (36.7 C)  TempSrc: Oral Oral Oral Oral  Resp: 20 20 20 20   Height:      Weight:   54.3 kg (119 lb 11.4 oz)   SpO2: 100% 98% 98% 99%    Intake/Output Summary (Last 24 hours) at 08/01/14 1023 Last data filed at 08/01/14 0700  Gross per 24 hour  Intake    600 ml  Output    400 ml  Net    200 ml   Filed Weights   07/30/14 1348 07/31/14 0445 08/01/14 0546  Weight: 53.071 kg (117 lb) 52.5 kg (115 lb 11.9 oz) 54.3 kg (119 lb 11.4 oz)    Exam:  General: Alert, awake, oriented x1, in no acute distress.  HEENT: No bruits, no goiter.  Heart: IRegular rate and rhythm. Lungs: Good air movement, clear Abdomen: Soft, nontender. Neuro: left side weakness   Data Reviewed: Basic Metabolic Panel:  Recent Labs Lab 07/26/14 0700 07/30/14 0954 07/31/14 0500  NA  --  132* 135*  K  --  4.7 4.4  CL  --  93* 96  CO2  --  30 29  GLUCOSE  --  97 83  BUN  --  28* 22  CREATININE  --  1.01 0.94  CALCIUM  --  9.0 8.9  MG 1.9  --   --    Liver Function Tests:  Recent Labs Lab 07/30/14 0954  AST 20  ALT 15  ALKPHOS 95  BILITOT 0.2*  PROT 6.8  ALBUMIN 2.8*   No results found for this basename: LIPASE, AMYLASE,  in the last 168 hours No results found for this basename: AMMONIA,  in the last 168  hours CBC:  Recent Labs Lab 07/30/14 0954 07/31/14 0500  WBC 5.4 5.2  NEUTROABS 4.0  --   HGB 9.6* 9.5*  HCT 28.3* 29.3*  MCV 87.3 91.6  PLT 357 376   Cardiac Enzymes: No results found for this basename: CKTOTAL, CKMB, CKMBINDEX, TROPONINI,  in the last 168 hours BNP (last 3 results) No results found for this basename: PROBNP,  in the last 8760 hours CBG:  Recent Labs Lab 07/31/14 0818 07/31/14 1216 07/31/14 1705 07/31/14 1953 08/01/14 0037  GLUCAP 82 91 151* 152* 113*    Recent Results (from the past 240 hour(s))  URINE CULTURE     Status: None    Collection Time    07/30/14 10:45 AM      Result Value Ref Range Status   Specimen Description URINE, RANDOM   Final   Special Requests NONE   Final   Culture  Setup Time     Final   Value: 07/30/2014 17:41     Performed at [facility]     Final   Value: >=100,000 COLONIES/ML     Performed at [facility]   Culture     Final   Value: PSEUDOMONAS AERUGINOSA     ENTEROCOCCUS SPECIES     Performed at [facility]   Report Status PENDING   Incomplete   Organism ID, Bacteria PSEUDOMONAS AERUGINOSA   Final     Studies: Dg Chest 2 View  07/30/2014   CLINICAL DATA:  Acute stroke.  EXAM: CHEST  2 VIEW  COMPARISON:  07/20/2014 and 01/06/2014  FINDINGS: There is severe emphysema. There is benign calcification in the right midzone, stable. Heart size and vascularity are normal. No acute osseous abnormalities. Diffuse osteopenia. Chronic by a lateral apical pleural thickening, left greater than right.  IMPRESSION: No acute abnormalities.  Severe emphysema.   Electronically Signed   By: Rozetta Nunnery M.D.   On: 07/30/2014 17:21   Mr Brain Wo Contrast  07/30/2014   CLINICAL DATA:  New left-sided weakness. Straight. History of multiple prior strokes. Atrial fibrillation.  EXAM: MRI HEAD WITHOUT CONTRAST  MRA HEAD WITHOUT CONTRAST  TECHNIQUE: Multiplanar, multiecho pulse sequences of the brain and surrounding structures were obtained without intravenous contrast. Angiographic images of the head were obtained using MRA technique without contrast.  COMPARISON:  Head CT 07/30/2014. Head MRI 07/25/2014. Head MRA 08/17/2012.  FINDINGS: MRI HEAD FINDINGS  Images are mildly degraded by motion.  There is a small, acute infarct centered in the posterior right corona radiata with a small amount of extension into the posterior aspects of the external and internal capsules. T2 hyperintensities in the periventricular white matter and pons are similar to the prior MRI and nonspecific  but compatible with moderate chronic small vessel ischemic disease. A remote left occipital lobe infarct is again seen. Small, remote infarcts are again seen in the bilateral corona radiata, basal ganglia, thalami, and cerebellum. There is moderate cerebral atrophy. There is no evidence of intracranial hemorrhage, mass, midline shift, or extra-axial fluid collection.  Orbits are unremarkable. Paranasal sinuses are clear. There is a small right mastoid effusion. Major intracranial vascular flow voids are preserved.  MRA HEAD FINDINGS  Images are moderately degraded by motion.  Visualized distal vertebral arteries are patent with the left being dominant. PICA origins are patent. SCA origins are patent. Mild irregularity is noted of the basilar artery without significant stenosis. There are patent bilateral posterior communicating arteries, with a  fetal type origin of the left PCA. There is no evidence of high-grade proximal PCA stenosis. Moderate bilateral PCA branch vessel irregularity is noted.  Mild ectasia is again noted at the distal cervical left ICA. Internal carotid arteries are patent from skullbase to carotid termini and again demonstrate mild ectasia. Bilateral M1 segments are patent without evidence of high-grade stenosis. MCA bifurcations and proximal M2 segments are poorly evaluated due to motion, particularly on the right. There is diminished flow related enhancement proximally in the right M2 superior division, suggestive of either severe stenosis or segmental occlusion, with some diminished flow present in more distal right MCA branches compared to the left. Left M2 branches appear grossly patent, although evaluation for stenosis is limited by motion. There is no proximal ACA stenosis. ACA branch vessel irregularity is noted bilaterally.  IMPRESSION: 1. Small, acute infarct primarily involving the right corona radiata. 2. Remote infarcts and moderate chronic small vessel ischemic disease as above. 3.  Moderately motion degraded MRA without large vessel occlusion. Proximal right M2 severe stenosis versus segmental occlusion.   Electronically Signed   By: Logan Bores   On: 07/30/2014 17:38   Dg Swallowing Func-speech Pathology  07/31/2014   Loletha Grayer, [place]     07/31/2014 12:17 PM Objective Swallowing Evaluation: Modified Barium Swallowing Study   Patient Details  Name: [name] MRN: [id number] Date of Birth: [birth year]  Today's Date: 07/31/2014 Time: 7062-3762 SLP Time Calculation (min): 26 min  Past Medical History:  Past Medical History  Diagnosis Date  . Bladder cancer     a. Dr. Lawerance Bach - [facility]. Multiple surgeries, chemo trial.  . Hyperlipidemia   . Anemia 04/10/2009  . Immune thrombocytopenic purpura     a. s/p splenectomy.  Marland Kitchen Anxiety   . Hypertension   . Permanent atrial fibrillation     a. Historically difficult to control rates  . COPD   . TIA (transient ischemic attack)     a. H/o TIA 2007.  . Stroke     a. H/o remote lacunar infarcts, at least 3 of them. b. Right  lenticular nucleus and corona radiata infarct, cardioembolic  07/3150. c. Stroke 11/2013, new infarct 2 weeks later 12/2013.  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 Gross hematuria     a. H/o bladder CA with intermittent gross hematuria.  Marland Kitchen Dysphagia   . Anemia    Past Surgical History:  Past Surgical History  Procedure Laterality Date  . Splenectomy    . Bilateral vats ablation    . Facial cancer      facial skin cancer   HPI:  Arye Weyenberg is a 80 y.o. male, with H/O multiple bilateral CVAs,  at baseline left side stronger than right, atrial fibrillation on  xaralto, hypertension, COPD, UTI, chronic intermittent hematuria,  who was recently discharged to a nursing home from this facility  after a UTI related admission, Pt was admitted from [facility] 07/30/14  with slurred speech and new onset dense left-sided weakness. MRI  revealed a small, acute infarct  primarily involving the right  corona radiata.     Assessment / Plan / Recommendation Clinical Impression  Dysphagia Diagnosis: Moderate oral phase dysphagia;Severe oral  phase dysphagia;Moderate pharyngeal phase dysphagia;Severe  pharyngeal phase dysphagia  Clinical Impression:   Pt presents with a moderate-severe  oropharyngeal dysphagia that is sensorimotor based. Pt's oral  phase is characterized by weak  and discoordinated manipulation  with lingual pumping and decreased bolus cohesion, leading to  both anterior and posterior loss. Pt exhibited very delayed  mastication with soft solid with residuals remaining in oral  cavity. Unfortunately, all liquid consistencies tested resulted  in silent aspiration or deep penetration to the vocal folds,  which occurred before the swallow with prematurely spilled  material. Solid PO was not observed to enter the airway, although  pharyngeal weakness led to mild-moderate diffuse residue,  particularly at the valleculae. Recommend to initiate Dys 1  textures and pudding thick liquids with pharyngeal strengthening  exercises to maximize strength and efficiency.    Treatment Recommendation  Therapy as outlined in treatment plan below    Diet Recommendation Dysphagia 1 (Puree);Pudding-thick liquid   Liquid Administration via: Spoon Medication Administration: Crushed with puree Supervision: Patient able to self feed;Staff to assist with self  feeding;Full supervision/cueing for compensatory strategies Compensations: Slow rate;Small sips/bites;Check for  pocketing;Check for anterior loss;Multiple dry swallows after  each bite/sip Postural Changes and/or Swallow Maneuvers: Seated upright 90  degrees;Upright 30-60 min after meal    Other  Recommendations Recommended Consults: MBS Oral Care Recommendations: Oral care BID Other Recommendations: Order thickener from pharmacy;Prohibited  food (jello, ice cream, thin soups);Remove water pitcher;Have  oral suction available   Follow Up  Recommendations  Inpatient Rehab;24 hour supervision/assistance    Frequency and Duration min 2x/week  2 weeks   Pertinent Vitals/Pain n/a    SLP Swallow Goals     General Date of Onset: 07/30/14 HPI: Brysyn Brandenberger is a 80 y.o. male, with H/O multiple bilateral  CVAs, at baseline left side stronger than right, atrial  fibrillation on xaralto, hypertension, COPD, UTI, chronic  intermittent hematuria, who was recently discharged to a nursing  home from this facility after a UTI related admission, Pt was  admitted [facility] 07/30/14 with slurred speech and new onset dense  left-sided weakness. MRI revealed a small, acute infarct  primarily involving the right corona radiata. Type of Study: Modified Barium Swallowing Study Reason for Referral: Objectively evaluate swallowing function Previous Swallow Assessment: most recent MBS 12/2013 recommending  Dys 1 and honey. Pt discharged from that admission on Dys 3 and  nectar with the water protocol. Diet Prior to this Study: NPO Temperature Spikes Noted: No Respiratory Status: Room air History of Recent Intubation: No Behavior/Cognition: Alert;Cooperative;Pleasant mood;Requires  cueing Oral Cavity - Dentition: Adequate natural dentition Oral Motor / Sensory Function: Impaired - see Bedside swallow  eval Self-Feeding Abilities: Needs assist Patient Positioning: Upright in chair Baseline Vocal Quality: Low vocal intensity Volitional Cough: Weak Volitional Swallow: Able to elicit Anatomy: Within functional limits Pharyngeal Secretions: Not observed secondary MBS    Reason for Referral Objectively evaluate swallowing function   Oral Phase Oral Preparation/Oral Phase Oral Phase: Impaired Oral - Honey Oral - Honey Teaspoon: Left anterior bolus loss;Weak lingual  manipulation;Lingual pumping;Reduced posterior propulsion;Delayed  oral transit Oral - Honey Cup: Left anterior bolus loss;Weak lingual  manipulation;Lingual pumping;Reduced posterior propulsion;Delayed  oral transit Oral -  Nectar Oral - Nectar Teaspoon: Left anterior bolus loss;Weak lingual  manipulation;Lingual pumping;Reduced posterior propulsion;Delayed  oral transit Oral - Nectar Cup: Left anterior bolus loss;Weak lingual  manipulation;Lingual pumping;Reduced posterior propulsion;Delayed  oral transit Oral - Solids Oral - Puree: Left anterior bolus loss;Weak lingual  manipulation;Lingual pumping;Reduced posterior propulsion;Delayed  oral transit Oral - Mechanical Soft: Left anterior bolus loss;Weak lingual  manipulation;Lingual pumping;Reduced posterior propulsion;Delayed  oral transit;Impaired mastication;Lingual/palatal residue   Pharyngeal Phase Pharyngeal Phase Pharyngeal  Phase: Impaired Pharyngeal - Honey Pharyngeal - Honey Teaspoon: Premature spillage to pyriform  sinuses;Reduced anterior laryngeal mobility;Reduced laryngeal  elevation;Reduced tongue base retraction;Penetration/Aspiration  before swallow;Reduced pharyngeal peristalsis;Pharyngeal residue  - valleculae;Pharyngeal residue - pyriform sinuses;Pharyngeal  residue - posterior pharnyx Penetration/Aspiration details (honey teaspoon): Material enters  airway, passes BELOW cords without attempt by patient to eject  out (silent aspiration) Pharyngeal - Honey Cup: Premature spillage to pyriform  sinuses;Reduced anterior laryngeal mobility;Reduced laryngeal  elevation;Reduced tongue base retraction;Penetration/Aspiration  before swallow;Reduced pharyngeal peristalsis;Pharyngeal residue  - valleculae;Pharyngeal residue - pyriform sinuses;Pharyngeal  residue - posterior pharnyx Penetration/Aspiration details (honey cup): Material enters  airway, passes BELOW cords without attempt by patient to eject  out (silent aspiration) Pharyngeal - Nectar Pharyngeal - Nectar Teaspoon: Premature spillage to pyriform  sinuses;Reduced anterior laryngeal mobility;Reduced laryngeal  elevation;Reduced tongue base retraction;Penetration/Aspiration  before swallow;Reduced pharyngeal  peristalsis;Pharyngeal residue  - valleculae;Pharyngeal residue - pyriform sinuses;Pharyngeal  residue - posterior pharnyx Penetration/Aspiration details (nectar teaspoon): Material enters  airway, CONTACTS cords and not ejected out Pharyngeal - Nectar Cup: Premature spillage to pyriform  sinuses;Reduced anterior laryngeal mobility;Reduced laryngeal  elevation;Reduced tongue base retraction;Penetration/Aspiration  before swallow;Reduced pharyngeal peristalsis;Pharyngeal residue  - valleculae;Pharyngeal residue - pyriform sinuses;Pharyngeal  residue - posterior pharnyx Penetration/Aspiration details (nectar cup): Material enters  airway, passes BELOW cords without attempt by patient to eject  out (silent aspiration) Pharyngeal - Solids Pharyngeal - Puree: Reduced anterior laryngeal mobility;Reduced  laryngeal elevation;Reduced tongue base retraction;Reduced  pharyngeal peristalsis;Pharyngeal residue - valleculae;Pharyngeal  residue - pyriform sinuses;Pharyngeal residue - posterior  pharnyx;Premature spillage to valleculae Penetration/Aspiration details (puree): Material does not enter  airway Pharyngeal - Mechanical Soft: Reduced anterior laryngeal  mobility;Reduced laryngeal elevation;Reduced tongue base  retraction;Reduced pharyngeal peristalsis;Pharyngeal residue -  valleculae;Pharyngeal residue - pyriform sinuses;Pharyngeal  residue - posterior pharnyx;Premature spillage to valleculae Penetration/Aspiration details (mechanical soft): Material does  not enter airway  Cervical Esophageal Phase    GO    Cervical Esophageal Phase Cervical Esophageal Phase: [facility]        Germain Osgood, M.A. CCC-SLP [phone]  Germain Osgood 07/31/2014, 12:13 PM    Mr Jodene Nam Head/brain Wo Cm  07/30/2014   CLINICAL DATA:  New left-sided weakness. Straight. History of multiple prior strokes. Atrial fibrillation.  EXAM: MRI HEAD WITHOUT CONTRAST  MRA HEAD WITHOUT CONTRAST  TECHNIQUE: Multiplanar, multiecho pulse sequences of the brain  and surrounding structures were obtained without intravenous contrast. Angiographic images of the head were obtained using MRA technique without contrast.  COMPARISON:  Head CT 07/30/2014. Head MRI 07/25/2014. Head MRA 08/17/2012.  FINDINGS: MRI HEAD FINDINGS  Images are mildly degraded by motion.  There is a small, acute infarct centered in the posterior right corona radiata with a small amount of extension into the posterior aspects of the external and internal capsules. T2 hyperintensities in the periventricular white matter and pons are similar to the prior MRI and nonspecific but compatible with moderate chronic small vessel ischemic disease. A remote left occipital lobe infarct is again seen. Small, remote infarcts are again seen in the bilateral corona radiata, basal ganglia, thalami, and cerebellum. There is moderate cerebral atrophy. There is no evidence of intracranial hemorrhage, mass, midline shift, or extra-axial fluid collection.  Orbits are unremarkable. Paranasal sinuses are clear. There is a small right mastoid effusion. Major intracranial vascular flow voids are preserved.  MRA HEAD FINDINGS  Images are moderately degraded by motion.  Visualized distal vertebral arteries are patent with the left being dominant. PICA origins are patent. SCA origins are  patent. Mild irregularity is noted of the basilar artery without significant stenosis. There are patent bilateral posterior communicating arteries, with a fetal type origin of the left PCA. There is no evidence of high-grade proximal PCA stenosis. Moderate bilateral PCA branch vessel irregularity is noted.  Mild ectasia is again noted at the distal cervical left ICA. Internal carotid arteries are patent from skullbase to carotid termini and again demonstrate mild ectasia. Bilateral M1 segments are patent without evidence of high-grade stenosis. MCA bifurcations and proximal M2 segments are poorly evaluated due to motion, particularly on the right.  There is diminished flow related enhancement proximally in the right M2 superior division, suggestive of either severe stenosis or segmental occlusion, with some diminished flow present in more distal right MCA branches compared to the left. Left M2 branches appear grossly patent, although evaluation for stenosis is limited by motion. There is no proximal ACA stenosis. ACA branch vessel irregularity is noted bilaterally.  IMPRESSION: 1. Small, acute infarct primarily involving the right corona radiata. 2. Remote infarcts and moderate chronic small vessel ischemic disease as above. 3. Moderately motion degraded MRA without large vessel occlusion. Proximal right M2 severe stenosis versus segmental occlusion.   Electronically Signed   By: Logan Bores   On: 07/30/2014 17:38    Scheduled Meds: . amiodarone  200 mg Oral BID  . apixaban  2.5 mg Oral BID  . bacitracin-polymyxin b   Both Eyes 6 times per day  . cholecalciferol  2,000 Units Oral Daily  . digoxin  0.125 mg Oral Daily  . diltiazem  180 mg Oral Daily  . docusate sodium  200 mg Oral BID  . sodium chloride  3 mL Intravenous Q12H   Continuous Infusions: . sodium chloride 50 mL/hr (07/31/14 0420)     Charlynne Cousins  [facility] Pager [phone]. If 8PM-8AM, please contact night-coverage at [email or web address], password [facility] 08/01/2014, 10:23 AM  LOS: 2 days      **Disclaimer: This note may have been dictated with voice recognition software. Similar sounding words can inadvertently be transcribed and this note may contain transcription errors which may not have been corrected upon publication of note.**

## 2014-08-02 ENCOUNTER — Inpatient Hospital Stay (HOSPITAL_COMMUNITY): Payer: Medicare Other

## 2014-08-02 DIAGNOSIS — R1310 Dysphagia, unspecified: Secondary | ICD-10-CM

## 2014-08-02 LAB — URINE CULTURE

## 2014-08-02 LAB — GLUCOSE, CAPILLARY
GLUCOSE-CAPILLARY: 106 mg/dL — AB (ref 70–99)
GLUCOSE-CAPILLARY: 130 mg/dL — AB (ref 70–99)
GLUCOSE-CAPILLARY: 135 mg/dL — AB (ref 70–99)
Glucose-Capillary: 109 mg/dL — ABNORMAL HIGH (ref 70–99)
Glucose-Capillary: 113 mg/dL — ABNORMAL HIGH (ref 70–99)

## 2014-08-02 MED ORDER — ACETAMINOPHEN 325 MG PO TABS
650.0000 mg | ORAL_TABLET | Freq: Four times a day (QID) | ORAL | Status: DC | PRN
  Administered 2014-08-02: 650 mg via ORAL
  Filled 2014-08-02: qty 2

## 2014-08-02 MED ORDER — SODIUM CHLORIDE 0.9 % IV SOLN
1.5000 g | Freq: Four times a day (QID) | INTRAVENOUS | Status: DC
  Administered 2014-08-02 – 2014-08-04 (×7): 1.5 g via INTRAVENOUS
  Filled 2014-08-02 (×10): qty 1.5

## 2014-08-02 MED ORDER — ACETAMINOPHEN 650 MG RE SUPP
650.0000 mg | RECTAL | Status: DC | PRN
  Administered 2014-08-04: 650 mg via RECTAL
  Filled 2014-08-02: qty 1

## 2014-08-02 NOTE — Clinical Social Work Psychosocial (Signed)
Clinical Social Work Department BRIEF PSYCHOSOCIAL ASSESSMENT 08/02/2014  Patient:  [name], [name]     Account Number:  [id number]     Admit date:  07/30/2014  Clinical Social Worker:  Delrae Sawyers  Date/Time:  08/02/2014 04:05 PM  Referred by:  Physician  Date Referred:  08/02/2014 Referred for  SNF Placement   Other Referral:   CIR.   Interview type:  Family Other interview type:   CSW spoke with pt's son, Calel Pisarski [phone])    PSYCHOSOCIAL DATA Living Status:  FACILITY Admitted from facility:  [facility] Level of care:  [place] Primary support name:  Shawnee Higham Primary support relationship to patient:  CHILD, ADULT Degree of support available:   Per pt's son, pt's wife is also very supportive.    CURRENT CONCERNS Current Concerns  Post-Acute Placement   Other Concerns:   none.    SOCIAL WORK ASSESSMENT / PLAN CSW met with pt's son, Lennette Bihari, regarding discharge disposition. Per Lennette Bihari, pt's family is hopeful of pt being admitted to [facility] but are interested in SNF as back-up. Pt's son states pt is from [facility], but would like to see other SNF options.    CSW has completed [facility] search and provided pt's son with SNF bed offers. Per pt's son, pt's family still actively [place] insurance authorization for CIR admission.    CIR to continue to follow and assist with discharge planning needs.   Assessment/plan status:  Psychosocial Support/Ongoing Assessment of Needs Other assessment/ plan:   none   Information/referral to community resources:   [facility] bed offers.    PATIENT'S/FAMILY'S RESPONSE TO PLAN OF CARE: Pt's family understanding and agreeable to CSW plan of care. Pt's son states family would prefer for pt to be admitted to CIR at time of discharge, but are open to SNF as a back-up plan. Pt's son expressed no further questions or concerns at this time.       Lubertha Sayres, MSW,  [facility] Licensed Clinical Social Worker [phone] and [phone]

## 2014-08-02 NOTE — Discharge Instructions (Signed)
Information on my medicine - ELIQUIS (apixaban)  This medication education was reviewed with me or my healthcare representative as part of my discharge preparation.  The pharmacist that spoke with me during my hospital stay was:  Beverlee Nims, [facility]  Why was Eliquis prescribed for you? Eliquis was prescribed for you to reduce the risk of a blood clot forming that can cause a stroke if you have a medical condition called atrial fibrillation (a type of irregular heartbeat).  What do You need to know about Eliquis ? Take your Eliquis TWICE DAILY - one tablet in the morning and one tablet in the evening with or without food. If you have difficulty swallowing the tablet whole please discuss with your pharmacist how to take the medication safely.  Take Eliquis exactly as prescribed by your doctor and DO NOT stop taking Eliquis without talking to the doctor who prescribed the medication.  Stopping may increase your risk of developing a stroke.  Refill your prescription before you run out.  After discharge, you should have regular check-up appointments with your healthcare provider that is prescribing your Eliquis.  In the future your dose may need to be changed if your kidney function or weight changes by a significant amount or as you get older.  What do you do if you miss a dose? If you miss a dose, take it as soon as you remember on the same day and resume taking twice daily.  Do not take more than one dose of ELIQUIS at the same time to make up a missed dose.  Important Safety Information A possible side effect of Eliquis is bleeding. You should call your healthcare provider right away if you experience any of the following:   Bleeding from an injury or your nose that does not stop.   Unusual colored urine (red or dark brown) or unusual colored stools (red or black).   Unusual bruising for unknown reasons.   A serious fall or if you hit your head (even if there is no bleeding).  Some  medicines may interact with Eliquis and might increase your risk of bleeding or clotting while on Eliquis. To help avoid this, consult your healthcare provider or pharmacist prior to using any new prescription or non-prescription medications, including herbals, vitamins, non-steroidal anti-inflammatory drugs (NSAIDs) and supplements.  This website has more information on Eliquis (apixaban): http://www.eliquis.com/eliquis/home

## 2014-08-02 NOTE — Progress Notes (Signed)
Speech Language Pathology Treatment: Dysphagia;Cognitive-Linquistic  Patient Details Name: [name] MRN: [id number] DOB: April 07, 1927 Today's Date: 08/02/2014 Time: 7972-8206 SLP Time Calculation (min): 33 min  Assessment / Plan / Recommendation Clinical Impression  Pt's selective attention to lunch meal was impacted by pain in left leg, rated 10/10 by patient. RN made aware. SLP and wife provided Max cueing for attention to PO intake, with no overt signs of aspiration observed with Dys 1 textures and pudding thick liquids. Pt with slowed oral preparation, but requiring only Min cues from SLP for hard swallow. Pt's speech was increasingly intelligible today as judged by this SLP, with mildly increased vocal intensity and more precise articulation. Pt verbalized his wants/needs, particularly as related to pain level, with Min cues for speech intelligibility strategies. Will continue to follow. Pt remains an excellent candidate for CIR.   HPI HPI: [name] is a 80 y.o. male, with H/O multiple bilateral CVAs, at baseline left side stronger than right, atrial fibrillation on xaralto, hypertension, COPD, UTI, chronic intermittent hematuria, who was recently discharged to a nursing home from this facility after a UTI related admission, Pt was admitted from [facility] 07/30/14 with slurred speech and new onset dense left-sided weakness. MRI revealed a small, acute infarct primarily involving the right corona radiata.   Pertinent Vitals Pain Assessment: 0-10 Pain Score: 10-Worst pain ever Pain Location: left leg Pain Descriptors / Indicators: Sharp Pain Intervention(s): Patient requesting pain meds-RN notified  SLP Plan  Continue with current plan of care    Recommendations Diet recommendations: Dysphagia 1 (puree);Pudding-thick liquid Liquids provided via: Teaspoon Medication Administration: Crushed with puree Supervision: Staff to assist with self feeding;Full supervision/cueing for compensatory  strategies;Trained caregiver to feed patient Compensations: Slow rate;Small sips/bites;Check for pocketing;Check for anterior loss;Multiple dry swallows after each bite/sip;Effortful swallow Postural Changes and/or Swallow Maneuvers: Seated upright 90 degrees;Upright 30-60 min after meal              Oral Care Recommendations: Oral care BID Follow up Recommendations: Inpatient Rehab;24 hour supervision/assistance Plan: Continue with current plan of care    GO      Germain Osgood, M.A. CCC-SLP [phone]  Germain Osgood 08/02/2014, 1:10 PM

## 2014-08-02 NOTE — Telephone Encounter (Signed)
Advised patient's wife. 

## 2014-08-02 NOTE — Progress Notes (Signed)
Progress Note from the Palliative Medicine Team at [facility]  Subjective: [name] is at bedside today when I came to see [name]. He is much more lethargic but still arouses to answer yes or no questions appropriately. He spiked fever today and is having PCXR. Discussed likely aspiration with [name] who described she feared this after his problems with breakfast yesterday. She is tearful and tells me she knows that a decision for feeding tube will be next. We discussed feeding tubes vs comfort feeds briefly and will continue this conversation Monday. She is tearful and emotionally exhausted and says she has not been sleeping well. She tells me how she feels so helpless. I reassured her that we will take this one day at a time and I will help her navigate decisions. I also gave her Hard Choices booklet.   She is also concerned about his pain (especially in LLE but generalized) that is new and is questioning him being placed on Lipitor. We discussed this could be aches/pains related to his fever/infection as well and we will continue to monitor. She is also concerned that these pains could be from spread from his bladder cancer - she says his last follow up June 2015 was contained to the bladder. I told her there is no evidence of cancer spread as far as I know but if this continues then this possibility may be further explored and worked up as needed.    Objective: Allergies  Allergen Reactions  . Azithromycin Other (See Comments)    Altered mental status  . Diazepam Other (See Comments)    REACTION: agitation  . Ezetimibe-Simvastatin Other (See Comments)    Unknown, wife thinks it's muscle pain  . Morphine Other (See Comments)    Hallucinations.  . Prednisone Other (See Comments)    Dizziness  . Sulfonamide Derivatives Hives   Scheduled Meds: . amiodarone  200 mg Oral BID  . ampicillin-sulbactam (UNASYN) IV  1.5 g Intravenous Q6H  . apixaban  2.5 mg Oral BID  . atorvastatin   40 mg Oral q1800  . bacitracin-polymyxin b   Both Eyes 6 times per day  . cholecalciferol  2,000 Units Oral Daily  . digoxin  0.125 mg Oral Daily  . diltiazem  180 mg Oral Daily  . docusate sodium  200 mg Oral BID  . sodium chloride  3 mL Intravenous Q12H   Continuous Infusions: . sodium chloride 50 mL/hr (07/31/14 0420)   PRN Meds:.acetaminophen, food thickener, guaiFENesin-dextromethorphan, ondansetron (ZOFRAN) IV, ondansetron, RESOURCE THICKENUP CLEAR, senna-docusate  BP 131/74  Pulse 86  Temp(Src) 99.6 F (37.6 C) (Axillary)  Resp 19  Ht 5\' 9"  (1.753 m)  Wt 55.9 kg (123 lb 3.8 oz)  BMI 18.19 kg/m2  SpO2 99%   PPS: 30%   Intake/Output Summary (Last 24 hours) at 08/02/14 1847 Last data filed at 08/02/14 1755  Gross per 24 hour  Intake    360 ml  Output   1000 ml  Net   -640 ml      LBM: 07/29/14      Physical Exam:  General: NAD, thin, frail  HEENT: [place]/AT, no JVD, moist mucous membranes  Chest: No labored breathing, symmetric  CVS: RRR  Abdomen: Soft, NT, ND  Ext: Left sided weakness, no edema  Neuro: Awake, alert, oriented to self, able to follow simple commands   Labs: CBC    Component Value Date/Time   WBC 5.2 07/31/2014 0500   RBC 3.20* 07/31/2014 0500  HGB 9.5* 07/31/2014 0500   HCT 29.3* 07/31/2014 0500   PLT 376 07/31/2014 0500   MCV 91.6 07/31/2014 0500   MCH 29.7 07/31/2014 0500   MCHC 32.4 07/31/2014 0500   RDW 14.2 07/31/2014 0500   LYMPHSABS 0.7 07/30/2014 0954   MONOABS 0.6 07/30/2014 0954   EOSABS 0.1 07/30/2014 0954   BASOSABS 0.0 07/30/2014 0954    BMET    Component Value Date/Time   NA 135* 07/31/2014 0500   K 4.4 07/31/2014 0500   CL 96 07/31/2014 0500   CO2 29 07/31/2014 0500   GLUCOSE 83 07/31/2014 0500   BUN 22 07/31/2014 0500   CREATININE 0.94 07/31/2014 0500   CALCIUM 8.9 07/31/2014 0500   GFRNONAA 75* 07/31/2014 0500   GFRAA 87* 07/31/2014 0500    CMP     Component Value Date/Time   NA 135* 07/31/2014 0500   K 4.4 07/31/2014 0500   CL 96 07/31/2014 0500   CO2  29 07/31/2014 0500   GLUCOSE 83 07/31/2014 0500   BUN 22 07/31/2014 0500   CREATININE 0.94 07/31/2014 0500   CALCIUM 8.9 07/31/2014 0500   PROT 6.8 07/30/2014 0954   ALBUMIN 2.8* 07/30/2014 0954   AST 20 07/30/2014 0954   ALT 15 07/30/2014 0954   ALKPHOS 95 07/30/2014 0954   BILITOT 0.2* 07/30/2014 0954   GFRNONAA 75* 07/31/2014 0500   GFRAA 87* 07/31/2014 0500     Assessment and Plan: 1. Code Status: DNR 2. Symptom Control: 1. Pain: Acetaminophen prn.  2. Bowel Regimen: Colace BID. Senokot-S qhs prn.  3. Cough: Robitussin DM prn.  4. Nausea: Ondansetron prn.  3. Psycho/Social: Emotional support provided to patient and [name].  4. Disposition: To be determined on outcomes.   Patient Documents Completed or Given: Document Given Completed  Advanced Directives Pkt    MOST    DNR    Gone from My Sight    Hard Choices yes     Time In Time Out Total Time Spent with Patient Total Overall Time  1525 1600 31min 26min    Greater than 50%  of this time was spent counseling and coordinating care related to the above assessment and plan.  Vinie Sill, NP Palliative Medicine Team Pager # [phone] (M-F 8a-5p) Team Phone # [phone] (Nights/Weekends)

## 2014-08-02 NOTE — Clinical Social Work Placement (Addendum)
Clinical Social Work Department CLINICAL SOCIAL WORK PLACEMENT NOTE 08/02/2014  Patient:  [name], [name]  Account Number:  [id number] Admit date:  07/30/2014  Clinical Social Worker:  Delrae Sawyers  Date/time:  08/02/2014 04:09 PM  Clinical Social Work is seeking post-discharge placement for this patient at the following level of care:   [place]   (*CSW will update this form in Epic as items are completed)   08/02/2014  Patient/family provided with [place] Department of Clinical Social Work's list of facilities offering this level of care within the geographic area requested by the patient (or if unable, by the patient's family).  08/02/2014  Patient/family informed of their freedom to choose among providers that offer the needed level of care, that participate in Medicare, Medicaid or managed care program needed by the patient, have an available bed and are willing to accept the patient.  08/02/2014  Patient/family informed of [facility]' ownership interest in [facility], as well as of the fact that they are under no obligation to receive care at this facility.  PASARR submitted to EDS on  PASARR number received on   FL2 transmitted to all facilities in geographic area requested by pt/family on  08/02/2014 FL2 transmitted to all facilities within larger geographic area on   Patient informed that his/her managed care company has contracts with or will negotiate with  certain facilities, including the following:     Patient/family informed of bed offers received:  08/02/2014 Patient chooses bed at [facility] Physician recommends and patient chooses bed at    Patient to be transferred to [facility] on  08/05/2014 Patient to be transferred to facility by [facility] Patient and family notified of transfer on 08/05/2014 Name of family member notified:  Pt's son, Brennen Camper, notified via phone. Pt's wife notified at bedside.  The following  physician request were entered in Epic:   Additional Comments: PASARR previously existing.  Lubertha Sayres, MSW, [address] [facility] Licensed Clinical Social Worker [phone] and [phone]

## 2014-08-02 NOTE — Telephone Encounter (Signed)
Yes, I reviewed his chart.  He is on amiodarone for rate control. Apixaban is a reasonable alternative to Xarelto.  Perhaps he will have less bleeding from his GU tract with the new anticoagulant.  Agree with changes made.

## 2014-08-02 NOTE — Progress Notes (Signed)
Per verbal order, pt should be NPO.  Order placed accordingly.

## 2014-08-02 NOTE — Progress Notes (Signed)
Pt c/o pain in LLE, painful to the touch which is a new complaint.  Pt now up in the chair and less responsive since therapy sessions, wife is very concerned.  Fine tremors noted in RUE, new per wife.  MD paged.  No new orders at this time.

## 2014-08-02 NOTE — Progress Notes (Addendum)
[facility] PROGRESS NOTE Assessment/Plan: CVA (cerebral infarction) - MRI showed as below. appriciate neurology assistance. - A1c 5.9, cont Eluquis - PT/OT consult rec CIR., carotid doppler, echo as below. No events on telemetry. - PMT meeting - Repeat SLP rec dys 1 diet. Check CXR. - awaiting insurance approval after peer-peer review.  Fever due to aspiration PNA: - NPO start Unasyn, acute encephalopathy due to aspiration PNA. - tylenol for fever. Cxr, blood culture and urine cultures.  Chronic Atrial fibrillation - Rate controlled, currently on combination of amiodarone, digoxin, Cardizem and Eluquis.  UTI (lower urinary tract infection): - Afebrile, no leukocytosis no abd tenderness. - Previous UC showed 45,000 colonies. - Hematuria due to bladder cancer and being Xarelto.  Anemia of chronic disease: - Stable no acute issues.   Suspected conjunctivitis: - Finishing antibiotic eye ointment treatment  Severe protein calorie malnutrition: - Place on protein supplementation.  Essential HTN: -  Stable cont diltiazem.  Code Status: DNR Family Communication: none  Disposition Plan: inpatient.   Consultants:  Neurology  Procedures: MRI/MRA 8.4.2015: Small, acute infarct primarily involving the right corona radiata. Remote infarcts and moderate chronic small vessel ischemic disease as above. Moderately motion degraded MRA without large vessel occlusion. Proximal right M2 severe stenosis versus segmental occlusion. Carotid doppler 8.6.2015: -39% ICA stenosis. Left: 40-59% internal carotid artery stenosis. Echo estimated ejection fraction was in the range of 55% to 60%. Indeterminant diastolic function (atrial fibrillation). Wall motion was normal; there were no regional wall motion abnormalities. Aortic valve: Trileaflet; moderately calcified leaflets. There was mild stenosis. There was trivial regurgitation. Mean gradient (S): 10 mm Hg. Peak gradient (S): 21 mm  Hg. Valve area (VTI): 1.7 cm^2.    Antibiotics:  None  HPI/Subjective: No complains,   Objective: Filed Vitals:   08/02/14 0230 08/02/14 0454 08/02/14 0700 08/02/14 1018  BP: 133/63  121/79 118/55  Pulse: 74  83 76  Temp: 98 F (36.7 C)  98.7 F (37.1 C) 98.2 F (36.8 C)  TempSrc: Oral  Oral Axillary  Resp: 20  18 17   Height:      Weight:  55.9 kg (123 lb 3.8 oz)    SpO2: 100%  97% 98%    Intake/Output Summary (Last 24 hours) at 08/02/14 1028 Last data filed at 08/02/14 0700  Gross per 24 hour  Intake      0 ml  Output    750 ml  Net   -750 ml   Filed Weights   07/31/14 0445 08/01/14 0546 08/02/14 0454  Weight: 52.5 kg (115 lb 11.9 oz) 54.3 kg (119 lb 11.4 oz) 55.9 kg (123 lb 3.8 oz)    Exam:  General: lethargic, in no acute distress.  HEENT: No bruits, no goiter.  Heart: IRegular rate and rhythm. Lungs: Good air movement, clear Abdomen: Soft, nontender. Neuro: left side weakness   Data Reviewed: Basic Metabolic Panel:  Recent Labs Lab 07/30/14 0954 07/31/14 0500  NA 132* 135*  K 4.7 4.4  CL 93* 96  CO2 30 29  GLUCOSE 97 83  BUN 28* 22  CREATININE 1.01 0.94  CALCIUM 9.0 8.9   Liver Function Tests:  Recent Labs Lab 07/30/14 0954  AST 20  ALT 15  ALKPHOS 95  BILITOT 0.2*  PROT 6.8  ALBUMIN 2.8*   No results found for this basename: LIPASE, AMYLASE,  in the last 168 hours No results found for this basename: AMMONIA,  in the last 168 hours CBC:  Recent Labs Lab 07/30/14  5176 07/31/14 0500  WBC 5.4 5.2  NEUTROABS 4.0  --   HGB 9.6* 9.5*  HCT 28.3* 29.3*  MCV 87.3 91.6  PLT 357 376   Cardiac Enzymes: No results found for this basename: CKTOTAL, CKMB, CKMBINDEX, TROPONINI,  in the last 168 hours BNP (last 3 results) No results found for this basename: PROBNP,  in the last 8760 hours CBG:  Recent Labs Lab 07/31/14 1953 08/01/14 0037 08/01/14 2035 08/01/14 2227 08/02/14 0029  GLUCAP 152* 113* 140* 107* 106*    Recent  Results (from the past 240 hour(s))  URINE CULTURE     Status: None   Collection Time    07/30/14 10:45 AM      Result Value Ref Range Status   Specimen Description URINE, RANDOM   Final   Special Requests NONE   Final   Culture  Setup Time     Final   Value: 07/30/2014 17:41     Performed at [place]     Final   Value: >=100,000 COLONIES/ML     Performed at [facility]   Culture     Final   Value: PSEUDOMONAS AERUGINOSA     ENTEROCOCCUS SPECIES     Performed at [facility]   Report Status 08/02/2014 FINAL   Final   Organism ID, Bacteria PSEUDOMONAS AERUGINOSA   Final   Organism ID, Bacteria ENTEROCOCCUS SPECIES   Final     Studies: Dg Swallowing Func-speech Pathology  07/31/2014   Loletha Grayer, CCC-SLP     07/31/2014 12:17 PM Objective Swallowing Evaluation: Modified Barium Swallowing Study   Patient Details  Name: [name] MRN: [id number] Date of Birth: Nov 18, 1927  Today's Date: 07/31/2014 Time: 2694-8546 SLP Time Calculation (min): 26 min  Past Medical History:  Past Medical History  Diagnosis Date  . Bladder cancer     a. Dr. Lawerance Bach - [facility]. Multiple surgeries, chemo trial.  . Hyperlipidemia   . Anemia 04/10/2009  . Immune thrombocytopenic purpura     a. s/p splenectomy.  Marland Kitchen Anxiety   . Hypertension   . Permanent atrial fibrillation     a. Historically difficult to control rates  . COPD   . TIA (transient ischemic attack)     a. H/o TIA 2007.  . Stroke     a. H/o remote lacunar infarcts, at least 3 of them. b. Right  lenticular nucleus and corona radiata infarct, cardioembolic  01/7034. c. Stroke 11/2013, new infarct 2 weeks later 12/2013.  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 Gross hematuria     a. H/o bladder CA with intermittent gross hematuria.  Marland Kitchen Dysphagia   . Anemia    Past Surgical History:  Past Surgical History  Procedure Laterality Date  .  Splenectomy    . Bilateral vats ablation    . Facial cancer      facial skin cancer   HPI:  [name] is a 80 y.o. male, with H/O multiple bilateral CVAs,  at baseline left side stronger than right, atrial fibrillation on  xaralto, hypertension, COPD, UTI, chronic intermittent hematuria,  who was recently discharged to a nursing home from this facility  after a UTI related admission, Pt was admitted from [facility] 07/30/14  with slurred speech and new onset dense left-sided weakness. MRI  revealed a small, acute infarct primarily involving the right  corona radiata.     Assessment / Plan / Recommendation Clinical Impression  Dysphagia Diagnosis: Moderate oral phase dysphagia;Severe oral  phase dysphagia;Moderate pharyngeal phase dysphagia;Severe  pharyngeal phase dysphagia  Clinical Impression:   Pt presents with a moderate-severe  oropharyngeal dysphagia that is sensorimotor based. Pt's oral  phase is characterized by weak and discoordinated manipulation  with lingual pumping and decreased bolus cohesion, leading to  both anterior and posterior loss. Pt exhibited very delayed  mastication with soft solid with residuals remaining in oral  cavity. Unfortunately, all liquid consistencies tested resulted  in silent aspiration or deep penetration to the vocal folds,  which occurred before the swallow with prematurely spilled  material. Solid PO was not observed to enter the airway, although  pharyngeal weakness led to mild-moderate diffuse residue,  particularly at the valleculae. Recommend to initiate Dys 1  textures and pudding thick liquids with pharyngeal strengthening  exercises to maximize strength and efficiency.    Treatment Recommendation  Therapy as outlined in treatment plan below    Diet Recommendation Dysphagia 1 (Puree);Pudding-thick liquid   Liquid Administration via: Spoon Medication Administration: Crushed with puree Supervision: Patient able to self feed;Staff to assist with self  feeding;Full  supervision/cueing for compensatory strategies Compensations: Slow rate;Small sips/bites;Check for  pocketing;Check for anterior loss;Multiple dry swallows after  each bite/sip Postural Changes and/or Swallow Maneuvers: Seated upright 90  degrees;Upright 30-60 min after meal    Other  Recommendations Recommended Consults: MBS Oral Care Recommendations: Oral care BID Other Recommendations: Order thickener from pharmacy;Prohibited  food (jello, ice cream, thin soups);Remove water pitcher;Have  oral suction available   Follow Up Recommendations  Inpatient Rehab;24 hour supervision/assistance    Frequency and Duration min 2x/week  2 weeks   Pertinent Vitals/Pain n/a    SLP Swallow Goals     General Date of Onset: 07/30/14 HPI: Jaymian Bogart is a 80 y.o. male, with H/O multiple bilateral  CVAs, at baseline left side stronger than right, atrial  fibrillation on xaralto, hypertension, COPD, UTI, chronic  intermittent hematuria, who was recently discharged to a nursing  home from this facility after a UTI related admission, Pt was  admitted from [facility] 07/30/14 with slurred speech and new onset dense  left-sided weakness. MRI revealed a small, acute infarct  primarily involving the right corona radiata. Type of Study: Modified Barium Swallowing Study Reason for Referral: Objectively evaluate swallowing function Previous Swallow Assessment: most recent MBS 12/2013 recommending  Dys 1 and honey. Pt discharged from that admission on Dys 3 and  nectar with the water protocol. Diet Prior to this Study: NPO Temperature Spikes Noted: No Respiratory Status: Room air History of Recent Intubation: No Behavior/Cognition: Alert;Cooperative;Pleasant mood;Requires  cueing Oral Cavity - Dentition: Adequate natural dentition Oral Motor / Sensory Function: Impaired - see Bedside swallow  eval Self-Feeding Abilities: Needs assist Patient Positioning: Upright in chair Baseline Vocal Quality: Low vocal intensity Volitional Cough: Weak Volitional  Swallow: Able to elicit Anatomy: Within functional limits Pharyngeal Secretions: Not observed secondary MBS    Reason for Referral Objectively evaluate swallowing function   Oral Phase Oral Preparation/Oral Phase Oral Phase: Impaired Oral - Honey Oral - Honey Teaspoon: Left anterior bolus loss;Weak lingual  manipulation;Lingual pumping;Reduced posterior propulsion;Delayed  oral transit Oral - Honey Cup: Left anterior bolus loss;Weak lingual  manipulation;Lingual pumping;Reduced posterior propulsion;Delayed  oral transit Oral - Nectar Oral - Nectar Teaspoon: Left anterior bolus loss;Weak lingual  manipulation;Lingual pumping;Reduced posterior propulsion;Delayed  oral transit Oral - Nectar Cup: Left anterior  bolus loss;Weak lingual  manipulation;Lingual pumping;Reduced posterior propulsion;Delayed  oral transit Oral - Solids Oral - Puree: Left anterior bolus loss;Weak lingual  manipulation;Lingual pumping;Reduced posterior propulsion;Delayed  oral transit Oral - Mechanical Soft: Left anterior bolus loss;Weak lingual  manipulation;Lingual pumping;Reduced posterior propulsion;Delayed  oral transit;Impaired mastication;Lingual/palatal residue   Pharyngeal Phase Pharyngeal Phase Pharyngeal Phase: Impaired Pharyngeal - Honey Pharyngeal - Honey Teaspoon: Premature spillage to pyriform  sinuses;Reduced anterior laryngeal mobility;Reduced laryngeal  elevation;Reduced tongue base retraction;Penetration/Aspiration  before swallow;Reduced pharyngeal peristalsis;Pharyngeal residue  - valleculae;Pharyngeal residue - pyriform sinuses;Pharyngeal  residue - posterior pharnyx Penetration/Aspiration details (honey teaspoon): Material enters  airway, passes BELOW cords without attempt by patient to eject  out (silent aspiration) Pharyngeal - Honey Cup: Premature spillage to pyriform  sinuses;Reduced anterior laryngeal mobility;Reduced laryngeal  elevation;Reduced tongue base retraction;Penetration/Aspiration  before swallow;Reduced  pharyngeal peristalsis;Pharyngeal residue  - valleculae;Pharyngeal residue - pyriform sinuses;Pharyngeal  residue - posterior pharnyx Penetration/Aspiration details (honey cup): Material enters  airway, passes BELOW cords without attempt by patient to eject  out (silent aspiration) Pharyngeal - Nectar Pharyngeal - Nectar Teaspoon: Premature spillage to pyriform  sinuses;Reduced anterior laryngeal mobility;Reduced laryngeal  elevation;Reduced tongue base retraction;Penetration/Aspiration  before swallow;Reduced pharyngeal peristalsis;Pharyngeal residue  - valleculae;Pharyngeal residue - pyriform sinuses;Pharyngeal  residue - posterior pharnyx Penetration/Aspiration details (nectar teaspoon): Material enters  airway, CONTACTS cords and not ejected out Pharyngeal - Nectar Cup: Premature spillage to pyriform  sinuses;Reduced anterior laryngeal mobility;Reduced laryngeal  elevation;Reduced tongue base retraction;Penetration/Aspiration  before swallow;Reduced pharyngeal peristalsis;Pharyngeal residue  - valleculae;Pharyngeal residue - pyriform sinuses;Pharyngeal  residue - posterior pharnyx Penetration/Aspiration details (nectar cup): Material enters  airway, passes BELOW cords without attempt by patient to eject  out (silent aspiration) Pharyngeal - Solids Pharyngeal - Puree: Reduced anterior laryngeal mobility;Reduced  laryngeal elevation;Reduced tongue base retraction;Reduced  pharyngeal peristalsis;Pharyngeal residue - valleculae;Pharyngeal  residue - pyriform sinuses;Pharyngeal residue - posterior  pharnyx;Premature spillage to valleculae Penetration/Aspiration details (puree): Material does not enter  airway Pharyngeal - Mechanical Soft: Reduced anterior laryngeal  mobility;Reduced laryngeal elevation;Reduced tongue base  retraction;Reduced pharyngeal peristalsis;Pharyngeal residue -  valleculae;Pharyngeal residue - pyriform sinuses;Pharyngeal  residue - posterior pharnyx;Premature spillage to valleculae  Penetration/Aspiration details (mechanical soft): Material does  not enter airway  Cervical Esophageal Phase    GO    Cervical Esophageal Phase Cervical Esophageal Phase: [facility]        Germain Osgood, M.A. CCC-SLP [phone]  Germain Osgood 07/31/2014, 12:13 PM     Scheduled Meds: . amiodarone  200 mg Oral BID  . apixaban  2.5 mg Oral BID  . atorvastatin  40 mg Oral q1800  . bacitracin-polymyxin b   Both Eyes 6 times per day  . cholecalciferol  2,000 Units Oral Daily  . digoxin  0.125 mg Oral Daily  . diltiazem  180 mg Oral Daily  . docusate sodium  200 mg Oral BID  . sodium chloride  3 mL Intravenous Q12H   Continuous Infusions: . sodium chloride 50 mL/hr (07/31/14 0420)     Charlynne Cousins  [facility] Pager [phone]. If 8PM-8AM, please contact night-coverage at [email or web address], password [facility] 08/02/2014, 10:28 AM  LOS: 3 days      **Disclaimer: This note may have been dictated with voice recognition software. Similar sounding words can inadvertently be transcribed and this note may contain transcription errors which may not have been corrected upon publication of note.**

## 2014-08-02 NOTE — Progress Notes (Signed)
Rehab admissions - Met with family late yesterday afternoon.  Family upset about denial.  I have given family customer service number for [employer].  Received call from [facility] requesting information to do a peer to peer for attending MD.  Information given.  Awaiting further decision from [facility] regarding possible inpatient rehab admission.  Call me for questions.  #[phone]

## 2014-08-02 NOTE — Progress Notes (Signed)
Physical Therapy Treatment Patient Details Name: [name] MRN: [id number] DOB: 21-Apr-1927 Today's Date: 08/02/2014    History of Present Illness 65 t.o. male admitted to [facility] on 07/30/14 from SNF where he was participating in rehab after recent admission on 7/31.  He presented with L sided weakness.  Stroke workup in progress.  MRI revealed right corona radiata infarct.  Pt with significant PMHx of anemia, HTN, A-fib, COPD, stroke/TIA, and bladder CA,.      PT Comments    Even though more lethargic today, pt participated throughout session.  Emphasized sitting balance to activate trunk and standing tolerance.  Follow Up Recommendations  CIR     Equipment Recommendations  Wheelchair (measurements PT);Wheelchair cushion (measurements PT);Hospital bed;Other (comment)    Recommendations for Other Services Rehab consult     Precautions / Restrictions Precautions Precaution Comments: L UE relatively low tone, but actively working to try and resist movement,  R side movement uncoordinated and with noticeable resistance to movement    Mobility  Bed Mobility Overal bed mobility: Needs Assistance Bed Mobility: Rolling;Sidelying to Sit Rolling: Max assist Sidelying to sit: Total assist       General bed mobility comments: pt initiated roll with v/t cues.  Consistent v/t cues to progress through task  Transfers Overall transfer level: Needs assistance Equipment used: 2 person hand held assist Transfers: Sit to/from [employer] Transfers Sit to Stand: Max assist;+2 physical assistance   Squat pivot transfers: Max assist;+2 physical assistance     General transfer comment: More thouble handling the aches and pains that maximal support would cause at knees pelvis and sternum/shoulders today  Ambulation/Gait             General Gait Details: not able today   Stairs            Wheelchair Mobility    Modified Rankin (Stroke Patients Only) Modified Rankin  (Stroke Patients Only) Pre-Morbid Rankin Score: Moderately severe disability Modified Rankin: Severe disability     Balance Overall balance assessment: Needs assistance Sitting-balance support: Single extremity supported;No upper extremity supported Sitting balance-Leahy Scale: Poor Sitting balance - Comments: max to sit fully upright and mod to max assist to maintain balance EOB.  less ability than last treatment to extend fully upright, but more lethargic today.     Standing balance-Leahy Scale: Zero Standing balance comment: stood at EOB x2 for 10-15 secs each with maximal support of 2 persons to attain upright posture.  support given today made patient uncomfortable.                    Cognition Arousal/Alertness: Lethargic;Awake/alert (progressively more awake throughout session) Behavior During Therapy: Flat affect Overall Cognitive Status: Impaired/Different from baseline Area of Impairment: Attention;Following commands;Awareness   Current Attention Level: Sustained   Following Commands: Follows one step commands with increased time;Follows one step commands consistently (with added tactile cues)     Problem Solving: Slow processing;Difficulty sequencing      Exercises Other Exercises Other Exercises: AAROM to bil LE.  AAto Resisted hip/knee flexion/extention to bil LE    General Comments        Pertinent Vitals/Pain Pain Assessment: Faces Pain Score: 6  Faces Pain Scale: Hurts even more Pain Location: L and R knees with ROM or blocking during standing Pain Descriptors / Indicators: Sharp Pain Intervention(s): Patient requesting pain meds-RN notified    Home Living  Prior Function            PT Goals (current goals can now be found in the care plan section) Acute Rehab PT Goals Patient Stated Goal: unable to state PT Goal Formulation: With patient/family Time For Goal Achievement: 08/14/14 Potential to Achieve Goals:  Good Progress towards PT goals: Progressing toward goals    Frequency  Min 3X/week    PT Plan Frequency needs to be updated    Co-evaluation             End of Session   Activity Tolerance: Patient limited by lethargy;Patient tolerated treatment well Patient left: in chair;with call bell/phone within reach;with family/visitor present     Time: 0929-5747 PT Time Calculation (min): 25 min  Charges:  $Therapeutic Activity: 23-37 mins                    G Codes:      Kerigan Narvaez, Tessie Fass 08/02/2014, 2:21 PM 08/02/2014  Donnella Sham, PT [phone]  (pager)

## 2014-08-02 NOTE — Care Management Note (Addendum)
  Page 2 of 2   08/05/2014     11:29:18 AM CARE MANAGEMENT NOTE 08/05/2014  Patient:  [name], [name]   Account Number:  [id number]  Date Initiated:  08/01/2014  Documentation initiated by:  Lorne Skeens  Subjective/Objective Assessment:   Patient admitted with CVA. Was undergoing SNF Rehab at [facility] prior to admission following recent discharge.     Action/Plan:   Will follow for discharge needs pending PT/OT evals and physician orders.   Anticipated DC Date:     Anticipated DC Plan:  SKILLED NURSING FACILITY  In-house referral  Clinical Social Worker         Choice offered to / List presented to:             Status of service:  In process, will continue to follow Medicare Important Message given?  YES (If response is "NO", the following Medicare IM given date fields will be blank) Date Medicare IM given:  08/02/2014 Medicare IM given by:  Lorne Skeens Date Additional Medicare IM given:  08/05/2014 Additional Medicare IM given by:  Lorne Skeens  Discharge Disposition:  [place]  Per UR Regulation:  Reviewed for med. necessity/level of care/duration of stay  If discussed at [place] of Stay Meetings, dates discussed:    Comments:  08/05/14 [place], MSN, CM- Met with patient and wife to provide additional Medicare IM letter.   08/02/14 [place], MSN, CM- Received a call from patient's son stating that [facility] would not speak with him regarding the appeal because they did not have POA information on file.  Son faxed information, but was told that it would take 7-10 days to process. Per son, [facility] needed attending physician to call regarding the appeal. CM called [employer] to provided MD contact number and was told that the appeal had to be in writing with specific clinicals sent.  CM contact CIR liason for assistance completing the appeal.  Son Lennette Bihari was updated. Dr Aileen Fass aware.   08/02/14 [place], MSN, CM-  Met with wife to provide updates and Medicare IM letter.   08/02/14 0940  Lorne Skeens RN, MSN, CM- Spoke with [employer] to provide contact information for Dr Aileen Fass to do a peer-to-peer regarding insurance denial of CIR.  The peer-to-peer ultimately resulted in a second denial, which family has chosen to appeal.  CM spoke again with [facility] to obtain instructions for the appeal.  Per Katrina at [facility], patient's family can call the expedited appeals dept [phone] to file appeal for service record [phone] [id number].  This information was shared with patient's wife and son Lennette Bihari [phone].  Family was instructed to continue SNF search, as approval on appeal is unlikely.  CSW faxed available bed offers to son Kevin's wife Juliene Pina [phone], per family request.

## 2014-08-03 DIAGNOSIS — R509 Fever, unspecified: Secondary | ICD-10-CM

## 2014-08-03 LAB — GLUCOSE, CAPILLARY
GLUCOSE-CAPILLARY: 94 mg/dL (ref 70–99)
Glucose-Capillary: 93 mg/dL (ref 70–99)
Glucose-Capillary: 119 mg/dL — ABNORMAL HIGH (ref 70–99)
Glucose-Capillary: 92 mg/dL (ref 70–99)
Glucose-Capillary: 135 mg/dL — ABNORMAL HIGH (ref 70–99)

## 2014-08-03 MED ORDER — DOCUSATE SODIUM 50 MG/5ML PO LIQD
200.0000 mg | Freq: Two times a day (BID) | ORAL | Status: DC
  Administered 2014-08-03 – 2014-08-04 (×4): 200 mg via ORAL
  Filled 2014-08-03 (×3): qty 20

## 2014-08-03 NOTE — Progress Notes (Signed)
[facility] PROGRESS NOTE Assessment/Plan: CVA (cerebral infarction): - MRI showed as below. appriciate neurology assistance. - A1c 5.9, cont Eluquis - PT/OT consult rec CIR, carotid doppler, echo as below. No events on telemetry. - PMT meeting - Repeat SLP rec dys 1 diet.  - Family appealing.  Fever due to aspiration PNA: - Cont Unasyn. - Tylenol for fever. CXR no infiltrate concerning for PNA. - Blood culture and urine cultures pending. - family will like to feed him, he will like to be fed, know risk. - Dys 1 diet.  Chronic Atrial fibrillation: - Rate controlled, currently on combination of amiodarone, digoxin, Cardizem and Eluquis.  UTI (lower urinary tract infection): - Afebrile, no leukocytosis no abd tenderness. - Previous UC showed 45,000 colonies. - Hematuria due to bladder cancer and being Eluquis.  Anemia of chronic disease: - Stable no acute issues.   Suspected conjunctivitis: - Finishing antibiotic eye ointment treatment  Severe protein calorie malnutrition: - Place on protein supplementation.  Essential HTN: -  Stable cont diltiazem.  Code Status: DNR Family Communication: none  Disposition Plan: inpatient.   Consultants:  Neurology  Procedures: MRI/MRA 8.4.2015: Small, acute infarct primarily involving the right corona radiata. Remote infarcts and moderate chronic small vessel ischemic disease as above. Moderately motion degraded MRA without large vessel occlusion. Proximal right M2 severe stenosis versus segmental occlusion. Carotid doppler 8.6.2015: -39% ICA stenosis. Left: 40-59% internal carotid artery stenosis. Echo estimated ejection fraction was in the range of 55% to 60%. Indeterminant diastolic function (atrial fibrillation). Wall motion was normal; there were no regional wall motion abnormalities. Aortic valve: Trileaflet; moderately calcified leaflets. There was mild stenosis. There was trivial regurgitation. Mean gradient (S): 10  mm Hg. Peak gradient (S): 21 mm Hg. Valve area (VTI): 1.7 cm^2.    Antibiotics:  None  HPI/Subjective: No complains.  Objective: Filed Vitals:   08/02/14 2126 08/03/14 0104 08/03/14 0500 08/03/14 0510  BP: 135/79 113/62  117/60  Pulse: 78 76  80  Temp: 99.1 F (37.3 C) 98 F (36.7 C)  98.2 F (36.8 C)  TempSrc: Axillary Axillary  Axillary  Resp: 18 18  18   Height:      Weight:   51.3 kg (113 lb 1.5 oz)   SpO2: 99% 99%  99%    Intake/Output Summary (Last 24 hours) at 08/03/14 0941 Last data filed at 08/03/14 0500  Gross per 24 hour  Intake    360 ml  Output    850 ml  Net   -490 ml   Filed Weights   08/01/14 0546 08/02/14 0454 08/03/14 0500  Weight: 54.3 kg (119 lb 11.4 oz) 55.9 kg (123 lb 3.8 oz) 51.3 kg (113 lb 1.5 oz)    Exam: General: lethargic, in no acute distress.  HEENT: No bruits, no goiter. - JVD. Heart: IRegular rate and rhythm. Lungs: Good air movement, clear Abdomen: Soft, nontender. Neuro: left side weakness   Data Reviewed: Basic Metabolic Panel:  Recent Labs Lab 07/30/14 0954 07/31/14 0500  NA 132* 135*  K 4.7 4.4  CL 93* 96  CO2 30 29  GLUCOSE 97 83  BUN 28* 22  CREATININE 1.01 0.94  CALCIUM 9.0 8.9   Liver Function Tests:  Recent Labs Lab 07/30/14 0954  AST 20  ALT 15  ALKPHOS 95  BILITOT 0.2*  PROT 6.8  ALBUMIN 2.8*   No results found for this basename: LIPASE, AMYLASE,  in the last 168 hours No results found for this basename: AMMONIA,  in the last 168 hours CBC:  Recent Labs Lab 07/30/14 0954 07/31/14 0500  WBC 5.4 5.2  NEUTROABS 4.0  --   HGB 9.6* 9.5*  HCT 28.3* 29.3*  MCV 87.3 91.6  PLT 357 376   Cardiac Enzymes: No results found for this basename: CKTOTAL, CKMB, CKMBINDEX, TROPONINI,  in the last 168 hours BNP (last 3 results) No results found for this basename: PROBNP,  in the last 8760 hours CBG:  Recent Labs Lab 08/02/14 1631 08/02/14 1954 08/02/14 2345 08/03/14 0337 08/03/14 0736    GLUCAP 130* 113* 109* 94 93    Recent Results (from the past 240 hour(s))  URINE CULTURE     Status: None   Collection Time    07/30/14 10:45 AM      Result Value Ref Range Status   Specimen Description URINE, RANDOM   Final   Special Requests NONE   Final   Culture  Setup Time     Final   Value: 07/30/2014 17:41     Performed at [facility]     Final   Value: >=100,000 COLONIES/ML     Performed at [facility]   Culture     Final   Value: PSEUDOMONAS AERUGINOSA     ENTEROCOCCUS SPECIES     Performed at [facility]   Report Status 08/02/2014 FINAL   Final   Organism ID, Bacteria PSEUDOMONAS AERUGINOSA   Final   Organism ID, Bacteria ENTEROCOCCUS SPECIES   Final  CULTURE, BLOOD (ROUTINE X 2)     Status: None   Collection Time    08/02/14  3:40 PM      Result Value Ref Range Status   Specimen Description BLOOD LEFT ARM   Final   Special Requests BOTTLES DRAWN AEROBIC AND ANAEROBIC 5CC   Final   Culture PENDING   Incomplete   Report Status PENDING   Incomplete  CULTURE, BLOOD (ROUTINE X 2)     Status: None   Collection Time    08/02/14  3:50 PM      Result Value Ref Range Status   Specimen Description BLOOD LEFT HAND   Final   Special Requests BOTTLES DRAWN AEROBIC AND ANAEROBIC 5CC   Final   Culture PENDING   Incomplete   Report Status PENDING   Incomplete     Studies: Dg Chest Port 1 View  08/02/2014   CLINICAL DATA:  Fever  EXAM: PORTABLE CHEST - 1 VIEW  COMPARISON:  07/30/2014  FINDINGS: Diffuse interstitial edema and vascular congestion have developed. Mild cardiomegaly. No pneumothorax. No pleural effusion. Chronic pleural changes.  IMPRESSION: New development of interstitial edema.   Electronically Signed   By: Maryclare Bean M.D.   On: 08/02/2014 16:25    Scheduled Meds: . amiodarone  200 mg Oral BID  . ampicillin-sulbactam (UNASYN) IV  1.5 g Intravenous Q6H  . apixaban  2.5 mg Oral BID  . atorvastatin  40 mg Oral q1800  .  bacitracin-polymyxin b   Both Eyes 6 times per day  . cholecalciferol  2,000 Units Oral Daily  . digoxin  0.125 mg Oral Daily  . diltiazem  180 mg Oral Daily  . docusate sodium  200 mg Oral BID  . sodium chloride  3 mL Intravenous Q12H   Continuous Infusions: . sodium chloride 50 mL/hr (07/31/14 0420)     Charlynne Cousins  [facility] Pager [phone]. If 8PM-8AM, please contact night-coverage at [email or web address], password  [email or web address] 08/03/2014, 9:41 AM  LOS: 4 days      **Disclaimer: This note may have been dictated with voice recognition software. Similar sounding words can inadvertently be transcribed and this note may contain transcription errors which may not have been corrected upon publication of note.**

## 2014-08-04 LAB — GLUCOSE, CAPILLARY
GLUCOSE-CAPILLARY: 99 mg/dL (ref 70–99)
GLUCOSE-CAPILLARY: 148 mg/dL — AB (ref 70–99)
Glucose-Capillary: 130 mg/dL — ABNORMAL HIGH (ref 70–99)
Glucose-Capillary: 134 mg/dL — ABNORMAL HIGH (ref 70–99)

## 2014-08-04 MED ORDER — AMOXICILLIN-POT CLAVULANATE 875-125 MG PO TABS: 1.0000 | ORAL_TABLET | Freq: Two times a day (BID) | ORAL | Status: DC

## 2014-08-04 MED ORDER — NITROFURANTOIN MONOHYD MACRO 100 MG PO CAPS: 100.0000 mg | ORAL_CAPSULE | Freq: Two times a day (BID) | ORAL | Status: AC

## 2014-08-04 MED ORDER — NITROFURANTOIN MONOHYD MACRO 100 MG PO CAPS
100.0000 mg | ORAL_CAPSULE | Freq: Two times a day (BID) | ORAL | Status: DC
  Administered 2014-08-04 – 2014-08-05 (×3): 100 mg via ORAL
  Filled 2014-08-04 (×5): qty 1

## 2014-08-04 MED ORDER — CIPROFLOXACIN HCL 500 MG PO TABS
500.0000 mg | ORAL_TABLET | Freq: Two times a day (BID) | ORAL | Status: DC
  Administered 2014-08-04 – 2014-08-05 (×3): 500 mg via ORAL
  Filled 2014-08-04 (×3): qty 1

## 2014-08-04 MED ORDER — POLYETHYLENE GLYCOL 3350 17 G PO PACK
17.0000 g | PACK | Freq: Two times a day (BID) | ORAL | Status: DC
  Administered 2014-08-04 – 2014-08-05 (×3): 17 g via ORAL
  Filled 2014-08-04 (×3): qty 1

## 2014-08-04 MED ORDER — CIPROFLOXACIN HCL 500 MG PO TABS: 500.0000 mg | ORAL_TABLET | Freq: Two times a day (BID) | ORAL | Status: AC

## 2014-08-04 MED ORDER — APIXABAN 2.5 MG PO TABS: 2.5000 mg | ORAL_TABLET | Freq: Two times a day (BID) | ORAL | Status: DC

## 2014-08-04 MED ORDER — ATORVASTATIN CALCIUM 40 MG PO TABS: 40.0000 mg | ORAL_TABLET | Freq: Every day | ORAL | Status: AC

## 2014-08-04 NOTE — Progress Notes (Signed)
Clinical Social Worker (CSW) contacted patient's son Aashir Umholtz to get SNF choice. Son chose [facility]. CSW contacted [place] weekend admissions coordinator at [facility] and made her aware of above. CSW will continue to follow and assist as needed.   Blima Rich, [place] Weekend CSW [phone]

## 2014-08-04 NOTE — Progress Notes (Signed)
[facility] PROGRESS NOTE Assessment/Plan: CVA (cerebral infarction): - MRI showed as below. appriciate neurology assistance. - A1c 5.9, cont Eluquis - PT/OT consult rec CIR, carotid doppler, echo as below. No events on telemetry. - PMT meeting - Repeat SLP rec dys 1 diet.  - Family appealing to insurance rejection of CIR.  Fever due to UTI: - has remained afebrile d/c unasyn. - Afebrile. CXR no infiltrate. - Urine culture grew pseudomonas sensitive to cipro and enteroccocus sensitve to PCN - family will like to feed him, he will like to be fed, know risk. - Dys 1 diet.  Chronic Atrial fibrillation: - Rate controlled, currently on combination of amiodarone, digoxin, Cardizem and Eluquis.  Anemia of chronic disease: - Stable no acute issues.   Suspected conjunctivitis: - Finishing antibiotic eye ointment treatment  Severe protein calorie malnutrition: - Place on protein supplementation.  Essential HTN: -  Stable cont diltiazem.  Code Status: DNR Family Communication: none  Disposition Plan: inpatient.   Consultants:  Neurology  Procedures: MRI/MRA 8.4.2015: Small, acute infarct primarily involving the right corona radiata. Remote infarcts and moderate chronic small vessel ischemic disease as above. Moderately motion degraded MRA without large vessel occlusion. Proximal right M2 severe stenosis versus segmental occlusion. Carotid doppler 8.6.2015: -39% ICA stenosis. Left: 40-59% internal carotid artery stenosis. Echo estimated ejection fraction was in the range of 55% to 60%. Indeterminant diastolic function (atrial fibrillation). Wall motion was normal; there were no regional wall motion abnormalities. Aortic valve: Trileaflet; moderately calcified leaflets. There was mild stenosis. There was trivial regurgitation. Mean gradient (S): 10 mm Hg. Peak gradient (S): 21 mm Hg. Valve area (VTI): 1.7 cm^2.    Antibiotics:  None  HPI/Subjective: No  complains.  Objective: Filed Vitals:   08/03/14 2159 08/04/14 0109 08/04/14 0500 08/04/14 0539  BP: 134/56 116/60  100/60  Pulse: 63 79  81  Temp: 99.7 F (37.6 C) 100 F (37.8 C)  98.7 F (37.1 C)  TempSrc: Oral Axillary  Axillary  Resp: 16 18  18   Height:      Weight:   57.4 kg (126 lb 8.7 oz)   SpO2: 96% 97%  99%   No intake or output data in the 24 hours ending 08/04/14 0905 Filed Weights   08/02/14 0454 08/03/14 0500 08/04/14 0500  Weight: 55.9 kg (123 lb 3.8 oz) 57.3 kg (126 lb 5.2 oz) 57.4 kg (126 lb 8.7 oz)    Exam: General: lethargic, in no acute distress.  HEENT: No bruits, no goiter. - JVD. Heart: IRegular rate and rhythm. Lungs: Good air movement, clear Abdomen: Soft, nontender.    Data Reviewed: Basic Metabolic Panel:  Recent Labs Lab 07/30/14 0954 07/31/14 0500  NA 132* 135*  K 4.7 4.4  CL 93* 96  CO2 30 29  GLUCOSE 97 83  BUN 28* 22  CREATININE 1.01 0.94  CALCIUM 9.0 8.9   Liver Function Tests:  Recent Labs Lab 07/30/14 0954  AST 20  ALT 15  ALKPHOS 95  BILITOT 0.2*  PROT 6.8  ALBUMIN 2.8*   No results found for this basename: LIPASE, AMYLASE,  in the last 168 hours No results found for this basename: AMMONIA,  in the last 168 hours CBC:  Recent Labs Lab 07/30/14 0954 07/31/14 0500  WBC 5.4 5.2  NEUTROABS 4.0  --   HGB 9.6* 9.5*  HCT 28.3* 29.3*  MCV 87.3 91.6  PLT 357 376   Cardiac Enzymes: No results found for this basename: CKTOTAL, CKMB, CKMBINDEX,  TROPONINI,  in the last 168 hours BNP (last 3 results) No results found for this basename: PROBNP,  in the last 8760 hours CBG:  Recent Labs Lab 08/03/14 0736 08/03/14 1152 08/03/14 1708 08/03/14 2058 08/04/14 0646  GLUCAP 93 119* 92 135* 130*    Recent Results (from the past 240 hour(s))  URINE CULTURE     Status: None   Collection Time    07/30/14 10:45 AM      Result Value Ref Range Status   Specimen Description URINE, RANDOM   Final   Special Requests  NONE   Final   Culture  Setup Time     Final   Value: 07/30/2014 17:41     Performed at [facility]     Final   Value: >=100,000 COLONIES/ML     Performed at [facility]   Culture     Final   Value: PSEUDOMONAS AERUGINOSA     ENTEROCOCCUS SPECIES     Performed at [facility]   Report Status 08/02/2014 FINAL   Final   Organism ID, Bacteria PSEUDOMONAS AERUGINOSA   Final   Organism ID, Bacteria ENTEROCOCCUS SPECIES   Final  CULTURE, BLOOD (ROUTINE X 2)     Status: None   Collection Time    08/02/14  3:40 PM      Result Value Ref Range Status   Specimen Description BLOOD LEFT ARM   Final   Special Requests BOTTLES DRAWN AEROBIC AND ANAEROBIC 5CC   Final   Culture  Setup Time     Final   Value: 08/02/2014 19:59     Performed at [facility]   Culture     Final   Value:        BLOOD CULTURE RECEIVED NO GROWTH TO DATE CULTURE WILL BE HELD FOR 5 DAYS BEFORE ISSUING A FINAL NEGATIVE REPORT     Performed at [facility]   Report Status PENDING   Incomplete  CULTURE, BLOOD (ROUTINE X 2)     Status: None   Collection Time    08/02/14  3:50 PM      Result Value Ref Range Status   Specimen Description BLOOD LEFT HAND   Final   Special Requests BOTTLES DRAWN AEROBIC AND ANAEROBIC 5CC   Final   Culture  Setup Time     Final   Value: 08/02/2014 19:58     Performed at [facility]   Culture     Final   Value:        BLOOD CULTURE RECEIVED NO GROWTH TO DATE CULTURE WILL BE HELD FOR 5 DAYS BEFORE ISSUING A FINAL NEGATIVE REPORT     Performed at [facility]   Report Status PENDING   Incomplete  URINE CULTURE     Status: None   Collection Time    08/02/14  5:32 PM      Result Value Ref Range Status   Specimen Description URINE, CATHETERIZED   Final   Special Requests Normal   Final   Culture  Setup Time     Final   Value: 08/03/2014 00:22     Performed at [place] PENDING   Incomplete    Culture     Final   Value: Culture reincubated for better growth     Performed at [facility]   Report Status PENDING   Incomplete     Studies: Dg  Chest Port 1 View  08/02/2014   CLINICAL DATA:  Fever  EXAM: PORTABLE CHEST - 1 VIEW  COMPARISON:  07/30/2014  FINDINGS: Diffuse interstitial edema and vascular congestion have developed. Mild cardiomegaly. No pneumothorax. No pleural effusion. Chronic pleural changes.  IMPRESSION: New development of interstitial edema.   Electronically Signed   By: Maryclare Bean M.D.   On: 08/02/2014 16:25    Scheduled Meds: . amiodarone  200 mg Oral BID  . ampicillin-sulbactam (UNASYN) IV  1.5 g Intravenous Q6H  . apixaban  2.5 mg Oral BID  . atorvastatin  40 mg Oral q1800  . bacitracin-polymyxin b   Both Eyes 6 times per day  . cholecalciferol  2,000 Units Oral Daily  . digoxin  0.125 mg Oral Daily  . diltiazem  180 mg Oral Daily  . docusate  200 mg Oral BID  . sodium chloride  3 mL Intravenous Q12H   Continuous Infusions:     Charlynne Cousins  [facility] Pager [phone]. If 8PM-8AM, please contact night-coverage at [email or web address], password [facility] 08/04/2014, 9:05 AM  LOS: 5 days      **Disclaimer: This note may have been dictated with voice recognition software. Similar sounding words can inadvertently be transcribed and this note may contain transcription errors which may not have been corrected upon publication of note.**

## 2014-08-04 NOTE — Discharge Summary (Signed)
Physician Discharge Summary  [name] OJJ:[id number] DOB: 07/19/1927 DOA: 07/30/2014  PCP: Cathlean Cower, MD  Admit date: 07/30/2014 Discharge date: 08/05/2014  Time spent: 40 minutes  Recommendations for Outpatient Follow-up:  1. Follow up with urology in 2 weeks. 2. Cardiology in 2-4 weeks   Discharge Diagnoses:  Principal Problem:   CVA (cerebral infarction) Active Problems:   NEOPLASM, MALIGNANT, BLADDER   HYPERLIPIDEMIA   HYPERTENSION   COPD   Left knee pain   UTI (lower urinary tract infection)   Atrial fibrillation   Protein-calorie malnutrition, severe   Physical deconditioning   Palliative care encounter   Dysphagia, unspecified(787.20)   Discharge Condition: Guarded  Diet recommendation: Dys 1 Pudding thick liq.  Filed Weights   08/03/14 0500 08/04/14 0500 08/05/14 0500  Weight: 57.3 kg (126 lb 5.2 oz) 57.4 kg (126 lb 8.7 oz) 57.8 kg (127 lb 6.8 oz)    History of present illness:  80 y.o. male, with H/O multiple bilateral CVAs, at baseline left side stronger than right, atrial fibrillation on xaralto, hypertension, COPD, UTI, chronic intermittent hematuria, who was recently discharged to a nursing home from this facility after a UTI related admission comes from nursing home with dense left-sided weakness which is new   Hospital Course:  CVA (cerebral infarction):  - MRI showed as below. Consulted Neurology.  - A1c 5.9, recommended to change to Eluquis  - PT/OT consult rec CIR. But did not qualify. Family decided to go back to [facility] - Carotid doppler, echo as below. No events on telemetry.  - PMT meeting, DNR/DNI no peg tube. - Repeat SLP rec dys 1 diet.   Fever due to UTI:  - Develop fever started on unasyn.  - Afebrile. CXR no infiltrate.  - Urine culture grew pseudomonas sensitive to cipro and enteroccocus sensitve to macrobid - Dys 1 diet.  - will treat with cipro and macrobid for until 8.14.2015.  Chronic Atrial fibrillation:  - Rate  controlled, currently on combination of amiodarone, digoxin, Cardizem and Eluquis.   Anemia of chronic disease:  - Stable no acute issues.   Suspected conjunctivitis:  - Finishing antibiotic eye ointment treatment   Severe protein calorie malnutrition:  - Place on protein supplementation.   Essential HTN:  - Stable cont diltiazem.   Code Status: DNR  Family Communication: none  Disposition Plan: inpatient.   Consultants:  Neurology  Procedures:  MRI/MRA 8.4.2015: Small, acute infarct primarily involving the right corona radiata. Remote infarcts and moderate chronic small vessel ischemic disease as above. Moderately motion degraded MRA without large vessel occlusion. Proximal right M2 severe stenosis versus segmental occlusion.  Carotid doppler 8.6.2015: -39% ICA stenosis. Left: 40-59% internal carotid artery stenosis.  Echo estimated ejection fraction was in the range of 55% to 60%. Indeterminant diastolic function (atrial fibrillation). Wall motion was normal; there were no regional wall motion abnormalities. Aortic valve: Trileaflet; moderately calcified leaflets. There was mild stenosis. There was trivial regurgitation. Mean gradient (S): 10 mm Hg. Peak gradient (S): 21 mm Hg. Valve area (VTI): 1.7 cm^2.   Discharge Exam: Filed Vitals:   08/05/14 0550  BP: 124/53  Pulse: 71  Temp: 97.3 F (36.3 C)  Resp: 18    General: A&O x2 Cardiovascular: RRR Respiratory: good air movement CTA B/L  Discharge Instructions You were cared for by a hospitalist during your hospital stay. If you have any questions about your discharge medications or the care you received while you were in the hospital after you are discharged,  you can call the unit and asked to speak with the hospitalist on call if the hospitalist that took care of you is not available. Once you are discharged, your primary care physician will handle any further medical issues. Please note that NO REFILLS for any discharge  medications will be authorized once you are discharged, as it is imperative that you return to your primary care physician (or establish a relationship with a primary care physician if you do not have one) for your aftercare needs so that they can reassess your need for medications and monitor your lab values.      Discharge Instructions   Diet - low sodium heart healthy    Complete by:  As directed      Increase activity slowly    Complete by:  As directed             Medication List    STOP taking these medications       Rivaroxaban 15 MG Tabs tablet  Commonly known as:  XARELTO      TAKE these medications       amiodarone 200 MG tablet  Commonly known as:  PACERONE  Take 1 tablet (200 mg total) by mouth 2 (two) times daily. Present dose for 2 weeks then 200 mg daily after 2 weeks.     apixaban 2.5 MG Tabs tablet  Commonly known as:  ELIQUIS  Take 1 tablet (2.5 mg total) by mouth 2 (two) times daily.     atorvastatin 40 MG tablet  Commonly known as:  LIPITOR  Take 1 tablet (40 mg total) by mouth daily at 6 PM.     bacitracin-polymyxin b ophthalmic ointment  Commonly known as:  POLYSPORIN  Place into both eyes every 4 (four) hours. apply to eye every 12 hours while awake     ciprofloxacin 500 MG tablet  Commonly known as:  CIPRO  Take 1 tablet (500 mg total) by mouth 2 (two) times daily.     digoxin 0.125 MG tablet  Commonly known as:  LANOXIN  Take 1 tablet (0.125 mg total) by mouth daily.     diltiazem 180 MG 24 hr capsule  Commonly known as:  CARDIZEM CD  Take 1 capsule (180 mg total) by mouth daily.     DSS 100 MG Caps  Take 200 mg by mouth 2 (two) times daily.     nitrofurantoin (macrocrystal-monohydrate) 100 MG capsule  Commonly known as:  MACROBID  Take 1 capsule (100 mg total) by mouth every 12 (twelve) hours.     Vitamin D 2000 UNITS tablet  Take 2,000 Units by mouth daily.       Allergies  Allergen Reactions  . Azithromycin Other (See  Comments)    Altered mental status  . Diazepam Other (See Comments)    REACTION: agitation  . Ezetimibe-Simvastatin Other (See Comments)    Unknown, wife thinks it's muscle pain  . Morphine Other (See Comments)    Hallucinations.  . Prednisone Other (See Comments)    Dizziness  . Sulfonamide Derivatives Hives   Follow-up Information   Follow up with Xu,Jindong, MD. Schedule an appointment as soon as possible for a visit in 2 months. (Stroke Clinic)    Specialty:  Neurology   Contact information:   [address] [place] [address] [phone]        The results of significant diagnostics from this hospitalization (including imaging, microbiology, ancillary and laboratory) are listed below for reference.  Significant Diagnostic Studies: Dg Chest 2 View  07/30/2014   CLINICAL DATA:  Acute stroke.  EXAM: CHEST  2 VIEW  COMPARISON:  07/20/2014 and 01/06/2014  FINDINGS: There is severe emphysema. There is benign calcification in the right midzone, stable. Heart size and vascularity are normal. No acute osseous abnormalities. Diffuse osteopenia. Chronic by a lateral apical pleural thickening, left greater than right.  IMPRESSION: No acute abnormalities.  Severe emphysema.   Electronically Signed   By: Rozetta Nunnery M.D.   On: 07/30/2014 17:21   Ct Head Wo Contrast  07/30/2014   CLINICAL DATA:  Slurred speech  EXAM: CT HEAD WITHOUT CONTRAST  TECHNIQUE: Contiguous axial images were obtained from the base of the skull through the vertex without intravenous contrast.  COMPARISON:  07/25/2014  FINDINGS: The bony calvarium is intact. Atrophic changes are noted. A stable left occipital infarct is noted when compared with the prior exam. Scattered lacunar infarcts are noted within the deep white matter bilaterally and also stable. Chronic white matter ischemic changes noted. No acute hemorrhage, acute infarction or space-occupying mass lesion is identified.  IMPRESSION: Chronic  changes stable from recent MRI examination. No acute abnormality is noted.   Electronically Signed   By: Inez Catalina M.D.   On: 07/30/2014 10:00   Ct Head Wo Contrast  07/20/2014   CLINICAL DATA:  Generalized weakness and mental status change.  EXAM: CT HEAD WITHOUT CONTRAST  TECHNIQUE: Contiguous axial images were obtained from the base of the skull through the vertex without intravenous contrast.  COMPARISON:  01/05/2014.  FINDINGS: Stable age related cerebral atrophy, ventriculomegaly, periventricular white matter disease and remote lacunar-type basal ganglia infarcts. Evolutionary change in the left PCA infarct noted on the prior MRI with encephalomalacia. No definite acute infarct and no intracranial hemorrhage, mass lesion or extra-axial fluid collection.  The bony structures are intact. The paranasal sinuses and mastoid air cells are grossly clear. The globes are intact.  IMPRESSION: 1. Stable age related cerebral atrophy, ventriculomegaly and periventricular white matter disease. 2. Remote lacunar-type basal ganglia infarcts an remote left PCA infarct. 3. No acute intracranial findings, mass lesion or skull fracture.   Electronically Signed   By: Kalman Jewels M.D.   On: 07/20/2014 13:23   Mr Brain Wo Contrast  07/30/2014   CLINICAL DATA:  New left-sided weakness. Straight. History of multiple prior strokes. Atrial fibrillation.  EXAM: MRI HEAD WITHOUT CONTRAST  MRA HEAD WITHOUT CONTRAST  TECHNIQUE: Multiplanar, multiecho pulse sequences of the brain and surrounding structures were obtained without intravenous contrast. Angiographic images of the head were obtained using MRA technique without contrast.  COMPARISON:  Head CT 07/30/2014. Head MRI 07/25/2014. Head MRA 08/17/2012.  FINDINGS: MRI HEAD FINDINGS  Images are mildly degraded by motion.  There is a small, acute infarct centered in the posterior right corona radiata with a small amount of extension into the posterior aspects of the external and  internal capsules. T2 hyperintensities in the periventricular white matter and pons are similar to the prior MRI and nonspecific but compatible with moderate chronic small vessel ischemic disease. A remote left occipital lobe infarct is again seen. Small, remote infarcts are again seen in the bilateral corona radiata, basal ganglia, thalami, and cerebellum. There is moderate cerebral atrophy. There is no evidence of intracranial hemorrhage, mass, midline shift, or extra-axial fluid collection.  Orbits are unremarkable. Paranasal sinuses are clear. There is a small right mastoid effusion. Major intracranial vascular flow voids are preserved.  MRA HEAD FINDINGS  Images are moderately degraded by motion.  Visualized distal vertebral arteries are patent with the left being dominant. PICA origins are patent. SCA origins are patent. Mild irregularity is noted of the basilar artery without significant stenosis. There are patent bilateral posterior communicating arteries, with a fetal type origin of the left PCA. There is no evidence of high-grade proximal PCA stenosis. Moderate bilateral PCA branch vessel irregularity is noted.  Mild ectasia is again noted at the distal cervical left ICA. Internal carotid arteries are patent from skullbase to carotid termini and again demonstrate mild ectasia. Bilateral M1 segments are patent without evidence of high-grade stenosis. MCA bifurcations and proximal M2 segments are poorly evaluated due to motion, particularly on the right. There is diminished flow related enhancement proximally in the right M2 superior division, suggestive of either severe stenosis or segmental occlusion, with some diminished flow present in more distal right MCA branches compared to the left. Left M2 branches appear grossly patent, although evaluation for stenosis is limited by motion. There is no proximal ACA stenosis. ACA branch vessel irregularity is noted bilaterally.  IMPRESSION: 1. Small, acute infarct  primarily involving the right corona radiata. 2. Remote infarcts and moderate chronic small vessel ischemic disease as above. 3. Moderately motion degraded MRA without large vessel occlusion. Proximal right M2 severe stenosis versus segmental occlusion.   Electronically Signed   By: Logan Bores   On: 07/30/2014 17:38   Mr Brain Wo Contrast  07/25/2014   CLINICAL DATA:  Stroke  EXAM: MRI HEAD WITHOUT CONTRAST  TECHNIQUE: Multiplanar, multiecho pulse sequences of the brain and surrounding structures were obtained without intravenous contrast.  COMPARISON:  MRI 01/06/2014  FINDINGS: Generalized atrophy. Multiple areas of chronic infarction including the cerebral white matter bilaterally, and the basal ganglia bilaterally. Chronic infarct left occipital lobe which contains an area of hyperintensity on diffusion-weighted imaging. Favor chronic blood products although small area of acute infarct is possible. Correlate with any acute visual changes. No other areas of acute infarct.  Negative for mass lesion.  No shift of the midline structures.  Paranasal sinuses are clear.  IMPRESSION: Atrophy and moderately severe chronic ischemic change.  Chronic left occipital infarct as noted on the prior study. Small area of diffusion hyperintensity on the posterior aspect of this infarct. Favor chronic blood products versus a small area of acute infarct posterior to the encephalomalacia.   Electronically Signed   By: Franchot Gallo M.D.   On: 07/25/2014 10:09   Dg Chest Port 1 View  08/02/2014   CLINICAL DATA:  Fever  EXAM: PORTABLE CHEST - 1 VIEW  COMPARISON:  07/30/2014  FINDINGS: Diffuse interstitial edema and vascular congestion have developed. Mild cardiomegaly. No pneumothorax. No pleural effusion. Chronic pleural changes.  IMPRESSION: New development of interstitial edema.   Electronically Signed   By: Maryclare Bean M.D.   On: 08/02/2014 16:25   Dg Chest Portable 1 View  07/20/2014   CLINICAL DATA:  Chest pain  EXAM:  PORTABLE CHEST - 1 VIEW  COMPARISON:  January 06, 2014  FINDINGS: There is underlying emphysematous change. There are areas of mild scarring bilaterally. There is no frank edema or consolidation. Heart is upper normal in size with pulmonary vascularity reflecting the underlying emphysematous change. There is stable apical pleural thickening bilaterally, slightly more on the left than on the right. No adenopathy. There is atherosclerotic change in the aorta.  IMPRESSION: Underlying emphysema with areas of lung scarring. No edema or consolidation.   Electronically Signed  By: Lowella Grip M.D.   On: 07/20/2014 12:40   Dg Swallowing Func-speech Pathology  07/31/2014   Loletha Grayer, [place]     07/31/2014 12:17 PM Objective Swallowing Evaluation: Modified Barium Swallowing Study   Patient Details  Name: [name] MRN: [id number] Date of Birth: 17-Mar-1927  Today's Date: 07/31/2014 Time: 8413-2440 SLP Time Calculation (min): 26 min  Past Medical History:  Past Medical History  Diagnosis Date  . Bladder cancer     a. Dr. Lawerance Bach - [facility]. Multiple surgeries, chemo trial.  . Hyperlipidemia   . Anemia 04/10/2009  . Immune thrombocytopenic purpura     a. s/p splenectomy.  Marland Kitchen Anxiety   . Hypertension   . Permanent atrial fibrillation     a. Historically difficult to control rates  . COPD   . TIA (transient ischemic attack)     a. H/o TIA 2007.  . Stroke     a. H/o remote lacunar infarcts, at least 3 of them. b. Right  lenticular nucleus and corona radiata infarct, cardioembolic  12/270. c. Stroke 11/2013, new infarct 2 weeks later 12/2013.  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 Gross hematuria     a. H/o bladder CA with intermittent gross hematuria.  Marland Kitchen Dysphagia   . Anemia    Past Surgical History:  Past Surgical History  Procedure Laterality Date  . Splenectomy    . Bilateral vats ablation    . Facial cancer      facial skin cancer    HPI:  [name] is a 80 y.o. male, with H/O multiple bilateral CVAs,  at baseline left side stronger than right, atrial fibrillation on  xaralto, hypertension, COPD, UTI, chronic intermittent hematuria,  who was recently discharged to a nursing home from this facility  after a UTI related admission, Pt was admitted from [facility] 07/30/14  with slurred speech and new onset dense left-sided weakness. MRI  revealed a small, acute infarct primarily involving the right  corona radiata.     Assessment / Plan / Recommendation Clinical Impression  Dysphagia Diagnosis: Moderate oral phase dysphagia;Severe oral  phase dysphagia;Moderate pharyngeal phase dysphagia;Severe  pharyngeal phase dysphagia  Clinical Impression:   Pt presents with a moderate-severe  oropharyngeal dysphagia that is sensorimotor based. Pt's oral  phase is characterized by weak and discoordinated manipulation  with lingual pumping and decreased bolus cohesion, leading to  both anterior and posterior loss. Pt exhibited very delayed  mastication with soft solid with residuals remaining in oral  cavity. Unfortunately, all liquid consistencies tested resulted  in silent aspiration or deep penetration to the vocal folds,  which occurred before the swallow with prematurely spilled  material. Solid PO was not observed to enter the airway, although  pharyngeal weakness led to mild-moderate diffuse residue,  particularly at the valleculae. Recommend to initiate Dys 1  textures and pudding thick liquids with pharyngeal strengthening  exercises to maximize strength and efficiency.    Treatment Recommendation  Therapy as outlined in treatment plan below    Diet Recommendation Dysphagia 1 (Puree);Pudding-thick liquid   Liquid Administration via: Spoon Medication Administration: Crushed with puree Supervision: Patient able to self feed;Staff to assist with self  feeding;Full supervision/cueing for compensatory strategies Compensations: Slow rate;Small sips/bites;Check  for  pocketing;Check for anterior loss;Multiple dry swallows after  each bite/sip Postural Changes and/or Swallow Maneuvers: Seated upright 90  degrees;Upright 30-60 min after meal  Other  Recommendations Recommended Consults: MBS Oral Care Recommendations: Oral care BID Other Recommendations: Order thickener from pharmacy;Prohibited  food (jello, ice cream, thin soups);Remove water pitcher;Have  oral suction available   Follow Up Recommendations  Inpatient Rehab;24 hour supervision/assistance    Frequency and Duration min 2x/week  2 weeks   Pertinent Vitals/Pain n/a    SLP Swallow Goals     General Date of Onset: 07/30/14 HPI: [name] is a 80 y.o. male, with H/O multiple bilateral  CVAs, at baseline left side stronger than right, atrial  fibrillation on xaralto, hypertension, COPD, UTI, chronic  intermittent hematuria, who was recently discharged to a nursing  home from this facility after a UTI related admission, Pt was  admitted from [facility] 07/30/14 with slurred speech and new onset dense  left-sided weakness. MRI revealed a small, acute infarct  primarily involving the right corona radiata. Type of Study: Modified Barium Swallowing Study Reason for Referral: Objectively evaluate swallowing function Previous Swallow Assessment: most recent MBS 12/2013 recommending  Dys 1 and honey. Pt discharged from that admission on Dys 3 and  nectar with the water protocol. Diet Prior to this Study: NPO Temperature Spikes Noted: No Respiratory Status: Room air History of Recent Intubation: No Behavior/Cognition: Alert;Cooperative;Pleasant mood;Requires  cueing Oral Cavity - Dentition: Adequate natural dentition Oral Motor / Sensory Function: Impaired - see Bedside swallow  eval Self-Feeding Abilities: Needs assist Patient Positioning: Upright in chair Baseline Vocal Quality: Low vocal intensity Volitional Cough: Weak Volitional Swallow: Able to elicit Anatomy: Within functional limits Pharyngeal Secretions: Not observed  secondary MBS    Reason for Referral Objectively evaluate swallowing function   Oral Phase Oral Preparation/Oral Phase Oral Phase: Impaired Oral - Honey Oral - Honey Teaspoon: Left anterior bolus loss;Weak lingual  manipulation;Lingual pumping;Reduced posterior propulsion;Delayed  oral transit Oral - Honey Cup: Left anterior bolus loss;Weak lingual  manipulation;Lingual pumping;Reduced posterior propulsion;Delayed  oral transit Oral - Nectar Oral - Nectar Teaspoon: Left anterior bolus loss;Weak lingual  manipulation;Lingual pumping;Reduced posterior propulsion;Delayed  oral transit Oral - Nectar Cup: Left anterior bolus loss;Weak lingual  manipulation;Lingual pumping;Reduced posterior propulsion;Delayed  oral transit Oral - Solids Oral - Puree: Left anterior bolus loss;Weak lingual  manipulation;Lingual pumping;Reduced posterior propulsion;Delayed  oral transit Oral - Mechanical Soft: Left anterior bolus loss;Weak lingual  manipulation;Lingual pumping;Reduced posterior propulsion;Delayed  oral transit;Impaired mastication;Lingual/palatal residue   Pharyngeal Phase Pharyngeal Phase Pharyngeal Phase: Impaired Pharyngeal - Honey Pharyngeal - Honey Teaspoon: Premature spillage to pyriform  sinuses;Reduced anterior laryngeal mobility;Reduced laryngeal  elevation;Reduced tongue base retraction;Penetration/Aspiration  before swallow;Reduced pharyngeal peristalsis;Pharyngeal residue  - valleculae;Pharyngeal residue - pyriform sinuses;Pharyngeal  residue - posterior pharnyx Penetration/Aspiration details (honey teaspoon): Material enters  airway, passes BELOW cords without attempt by patient to eject  out (silent aspiration) Pharyngeal - Honey Cup: Premature spillage to pyriform  sinuses;Reduced anterior laryngeal mobility;Reduced laryngeal  elevation;Reduced tongue base retraction;Penetration/Aspiration  before swallow;Reduced pharyngeal peristalsis;Pharyngeal residue  - valleculae;Pharyngeal residue - pyriform  sinuses;Pharyngeal  residue - posterior pharnyx Penetration/Aspiration details (honey cup): Material enters  airway, passes BELOW cords without attempt by patient to eject  out (silent aspiration) Pharyngeal - Nectar Pharyngeal - Nectar Teaspoon: Premature spillage to pyriform  sinuses;Reduced anterior laryngeal mobility;Reduced laryngeal  elevation;Reduced tongue base retraction;Penetration/Aspiration  before swallow;Reduced pharyngeal peristalsis;Pharyngeal residue  - valleculae;Pharyngeal residue - pyriform sinuses;Pharyngeal  residue - posterior pharnyx Penetration/Aspiration details (nectar teaspoon): Material enters  airway, CONTACTS cords and not ejected out Pharyngeal - Nectar Cup: Premature spillage to pyriform  sinuses;Reduced anterior laryngeal mobility;Reduced laryngeal  elevation;Reduced tongue base retraction;Penetration/Aspiration  before swallow;Reduced pharyngeal peristalsis;Pharyngeal residue  - valleculae;Pharyngeal residue - pyriform sinuses;Pharyngeal  residue - posterior pharnyx Penetration/Aspiration details (nectar cup): Material enters  airway, passes BELOW cords without attempt by patient to eject  out (silent aspiration) Pharyngeal - Solids Pharyngeal - Puree: Reduced anterior laryngeal mobility;Reduced  laryngeal elevation;Reduced tongue base retraction;Reduced  pharyngeal peristalsis;Pharyngeal residue - valleculae;Pharyngeal  residue - pyriform sinuses;Pharyngeal residue - posterior  pharnyx;Premature spillage to valleculae Penetration/Aspiration details (puree): Material does not enter  airway Pharyngeal - Mechanical Soft: Reduced anterior laryngeal  mobility;Reduced laryngeal elevation;Reduced tongue base  retraction;Reduced pharyngeal peristalsis;Pharyngeal residue -  valleculae;Pharyngeal residue - pyriform sinuses;Pharyngeal  residue - posterior pharnyx;Premature spillage to valleculae Penetration/Aspiration details (mechanical soft): Material does  not enter airway  Cervical  Esophageal Phase    GO    Cervical Esophageal Phase Cervical Esophageal Phase: [facility]        Germain Osgood, M.A. CCC-SLP [phone]  Germain Osgood 07/31/2014, 12:13 PM    Mr [name] Head/brain Wo Cm  07/30/2014   CLINICAL DATA:  New left-sided weakness. Straight. History of multiple prior strokes. Atrial fibrillation.  EXAM: MRI HEAD WITHOUT CONTRAST  MRA HEAD WITHOUT CONTRAST  TECHNIQUE: Multiplanar, multiecho pulse sequences of the brain and surrounding structures were obtained without intravenous contrast. Angiographic images of the head were obtained using MRA technique without contrast.  COMPARISON:  Head CT 07/30/2014. Head MRI 07/25/2014. Head MRA 08/17/2012.  FINDINGS: MRI HEAD FINDINGS  Images are mildly degraded by motion.  There is a small, acute infarct centered in the posterior right corona radiata with a small amount of extension into the posterior aspects of the external and internal capsules. T2 hyperintensities in the periventricular white matter and pons are similar to the prior MRI and nonspecific but compatible with moderate chronic small vessel ischemic disease. A remote left occipital lobe infarct is again seen. Small, remote infarcts are again seen in the bilateral corona radiata, basal ganglia, thalami, and cerebellum. There is moderate cerebral atrophy. There is no evidence of intracranial hemorrhage, mass, midline shift, or extra-axial fluid collection.  Orbits are unremarkable. Paranasal sinuses are clear. There is a small right mastoid effusion. Major intracranial vascular flow voids are preserved.  MRA HEAD FINDINGS  Images are moderately degraded by motion.  Visualized distal vertebral arteries are patent with the left being dominant. PICA origins are patent. SCA origins are patent. Mild irregularity is noted of the basilar artery without significant stenosis. There are patent bilateral posterior communicating arteries, with a fetal type origin of the left PCA. There is no evidence  of high-grade proximal PCA stenosis. Moderate bilateral PCA branch vessel irregularity is noted.  Mild ectasia is again noted at the distal cervical left ICA. Internal carotid arteries are patent from skullbase to carotid termini and again demonstrate mild ectasia. Bilateral M1 segments are patent without evidence of high-grade stenosis. MCA bifurcations and proximal M2 segments are poorly evaluated due to motion, particularly on the right. There is diminished flow related enhancement proximally in the right M2 superior division, suggestive of either severe stenosis or segmental occlusion, with some diminished flow present in more distal right MCA branches compared to the left. Left M2 branches appear grossly patent, although evaluation for stenosis is limited by motion. There is no proximal ACA stenosis. ACA branch vessel irregularity is noted bilaterally.  IMPRESSION: 1. Small, acute infarct primarily involving the right corona radiata. 2. Remote infarcts and moderate chronic small vessel ischemic disease as above. 3. Moderately motion degraded MRA  without large vessel occlusion. Proximal right M2 severe stenosis versus segmental occlusion.   Electronically Signed   By: Logan Bores   On: 07/30/2014 17:38    Microbiology: Recent Results (from the past 240 hour(s))  URINE CULTURE     Status: None   Collection Time    07/30/14 10:45 AM      Result Value Ref Range Status   Specimen Description URINE, RANDOM   Final   Special Requests NONE   Final   Culture  Setup Time     Final   Value: 07/30/2014 17:41     Performed at [facility]     Final   Value: >=100,000 COLONIES/ML     Performed at [facility]   Culture     Final   Value: PSEUDOMONAS AERUGINOSA     ENTEROCOCCUS SPECIES     Performed at [facility]   Report Status 08/02/2014 FINAL   Final   Organism ID, Bacteria PSEUDOMONAS AERUGINOSA   Final   Organism ID, Bacteria ENTEROCOCCUS SPECIES   Final   CULTURE, BLOOD (ROUTINE X 2)     Status: None   Collection Time    08/02/14  3:40 PM      Result Value Ref Range Status   Specimen Description BLOOD LEFT ARM   Final   Special Requests BOTTLES DRAWN AEROBIC AND ANAEROBIC 5CC   Final   Culture  Setup Time     Final   Value: 08/02/2014 19:59     Performed at [facility]   Culture     Final   Value:        BLOOD CULTURE RECEIVED NO GROWTH TO DATE CULTURE WILL BE HELD FOR 5 DAYS BEFORE ISSUING A FINAL NEGATIVE REPORT     Performed at [facility]   Report Status PENDING   Incomplete  CULTURE, BLOOD (ROUTINE X 2)     Status: None   Collection Time    08/02/14  3:50 PM      Result Value Ref Range Status   Specimen Description BLOOD LEFT HAND   Final   Special Requests BOTTLES DRAWN AEROBIC AND ANAEROBIC 5CC   Final   Culture  Setup Time     Final   Value: 08/02/2014 19:58     Performed at [facility]   Culture     Final   Value:        BLOOD CULTURE RECEIVED NO GROWTH TO DATE CULTURE WILL BE HELD FOR 5 DAYS BEFORE ISSUING A FINAL NEGATIVE REPORT     Performed at [facility]   Report Status PENDING   Incomplete  URINE CULTURE     Status: None   Collection Time    08/02/14  5:32 PM      Result Value Ref Range Status   Specimen Description URINE, CATHETERIZED   Final   Special Requests Normal   Final   Culture  Setup Time     Final   Value: 08/03/2014 00:22     Performed at [facility] PENDING   Incomplete   Culture     Final   Value: Culture reincubated for better growth     Performed at [facility]   Report Status PENDING   Incomplete     Labs: Basic Metabolic Panel:  Recent Labs Lab 07/30/14 0954 07/31/14 0500  NA 132* 135*  K 4.7 4.4  CL 93* 96  CO2 30 29  GLUCOSE 97 83  BUN 28* 22  CREATININE 1.01 0.94  CALCIUM 9.0 8.9   Liver Function Tests:  Recent Labs Lab 07/30/14 0954  AST 20  ALT 15  ALKPHOS 95  BILITOT 0.2*  PROT 6.8  ALBUMIN  2.8*   No results found for this basename: LIPASE, AMYLASE,  in the last 168 hours No results found for this basename: AMMONIA,  in the last 168 hours CBC:  Recent Labs Lab 07/30/14 0954 07/31/14 0500  WBC 5.4 5.2  NEUTROABS 4.0  --   HGB 9.6* 9.5*  HCT 28.3* 29.3*  MCV 87.3 91.6  PLT 357 376   Cardiac Enzymes: No results found for this basename: CKTOTAL, CKMB, CKMBINDEX, TROPONINI,  in the last 168 hours BNP: BNP (last 3 results) No results found for this basename: PROBNP,  in the last 8760 hours CBG:  Recent Labs Lab 08/04/14 0646 08/04/14 1159 08/04/14 1642 08/04/14 2126 08/05/14 0649  GLUCAP 130* 134* 99 148* 102*       Signed:  FELIZ ORTIZ, ABRAHAM  [facility] 08/05/2014, 8:03 AM

## 2014-08-05 LAB — GLUCOSE, CAPILLARY
GLUCOSE-CAPILLARY: 102 mg/dL — AB (ref 70–99)
Glucose-Capillary: 131 mg/dL — ABNORMAL HIGH (ref 70–99)
Glucose-Capillary: 111 mg/dL — ABNORMAL HIGH (ref 70–99)

## 2014-08-05 NOTE — Progress Notes (Signed)
Speech Language Pathology Treatment: Dysphagia;Cognitive-Linquistic  Patient Details Name: [name] MRN: [id number] DOB: 03-15-1927 Today's Date: 08/05/2014 Time: 7829-5621 SLP Time Calculation (min): 19 min  Assessment / Plan / Recommendation Clinical Impression  Pt was seen for f/u dysphagia and cognitive-linguistic treatment during lunch meal. Pt consumed Dys 1 textures and pudding thick liquids by spoon with intermittent cough, with Min verbal cueing provided by wife and SLP for more effortful swallows and decreased bolus manipulation time with pureed solids. Pt's speech was intelligible without cueing at the beginning of the session, however as he began to fatigue he began to require Mod cueing from SLP to facilitate functional communication. SLP provided Mod cues for intellectual and anticipatory awareness related to discussion of current level of function and d/c planning. Will continue to follow.   HPI HPI: [name] is a 80 y.o. male, with H/O multiple bilateral CVAs, at baseline left side stronger than right, atrial fibrillation on xaralto, hypertension, COPD, UTI, chronic intermittent hematuria, who was recently discharged to a nursing home from this facility after a UTI related admission, Pt was admitted from [facility] 07/30/14 with slurred speech and new onset dense left-sided weakness. MRI revealed a small, acute infarct primarily involving the right corona radiata.   Pertinent Vitals Pain Assessment: Faces Faces Pain Scale: Hurts little more Pain Location: generalized Pain Intervention(s): Limited activity within patient's tolerance;Monitored during session;Repositioned  SLP Plan  Continue with current plan of care    Recommendations Diet recommendations: Dysphagia 1 (puree);Pudding-thick liquid Liquids provided via: Teaspoon Medication Administration: Crushed with puree Supervision: Staff to assist with self feeding;Full supervision/cueing for compensatory strategies;Trained  caregiver to feed patient Compensations: Slow rate;Small sips/bites;Check for pocketing;Check for anterior loss;Multiple dry swallows after each bite/sip;Effortful swallow Postural Changes and/or Swallow Maneuvers: Seated upright 90 degrees;Upright 30-60 min after meal              Oral Care Recommendations: Oral care BID Follow up Recommendations: Skilled Nursing facility;Other (comment) (per chart, plan is for SNF as insurance denied CIR) Plan: Continue with current plan of care    GO      Germain Osgood, M.A. CCC-SLP [phone]  Germain Osgood 08/05/2014, 1:23 PM

## 2014-08-05 NOTE — Progress Notes (Signed)
Patient is discharged from room 4N10 at this time. Alert and in stable condition. IV site d/c'd. Patient is transferred to [facility] place and transported via Biomed[facility] by [employer]. Report given to nurse Debo. Wife took all belongings.

## 2014-08-05 NOTE — Progress Notes (Signed)
Progress Note from the Palliative Medicine Team at [place]: [name] is more alert today and is very pleasant answering all questions but difficult to understand at times. He tracks and seems to answer appropriately as far as I can understand. [name] is at bedside and is very concerned with his weakness, continued left leg/hip pain, and low BP this morning. She is disappointed that he is not eligible for CIR but we discussed SNF rehab might be a better pace for him and might be able to get him geographically closer to her. She says that he has been eating well and ate 100% of his breakfast without any coughing. We discussed his risks for aspiration with current diet and she understands. He enjoys eating and she wishes to continue this diet and does not give a firm no to feeding tube but wishes to avoid this. Strongly recommend palliative to follow at SNF and wife agrees with this recommendation.     Objective: Allergies  Allergen Reactions  . Azithromycin Other (See Comments)    Altered mental status  . Diazepam Other (See Comments)    REACTION: agitation  . Ezetimibe-Simvastatin Other (See Comments)    Unknown, wife thinks it's muscle pain  . Morphine Other (See Comments)    Hallucinations.  . Prednisone Other (See Comments)    Dizziness  . Sulfonamide Derivatives Hives   Scheduled Meds: . amiodarone  200 mg Oral BID  . apixaban  2.5 mg Oral BID  . atorvastatin  40 mg Oral q1800  . bacitracin-polymyxin b   Both Eyes 6 times per day  . cholecalciferol  2,000 Units Oral Daily  . ciprofloxacin  500 mg Oral BID  . digoxin  0.125 mg Oral Daily  . diltiazem  180 mg Oral Daily  . docusate  200 mg Oral BID  . nitrofurantoin (macrocrystal-monohydrate)  100 mg Oral Q12H  . polyethylene glycol  17 g Oral BID  . sodium chloride  3 mL Intravenous Q12H   Continuous Infusions:  PRN Meds:.acetaminophen, food thickener, guaiFENesin-dextromethorphan, ondansetron (ZOFRAN)  IV, ondansetron, RESOURCE THICKENUP CLEAR, senna-docusate  BP 98/56  Pulse 80  Temp(Src) 97.2 F (36.2 C) (Oral)  Resp 18  Ht 5\' 9"  (1.753 m)  Wt 57.8 kg (127 lb 6.8 oz)  BMI 18.81 kg/m2  SpO2 98%   PPS: 30%     Intake/Output Summary (Last 24 hours) at 08/05/14 1007 Last data filed at 08/05/14 0900  Gross per 24 hour  Intake    120 ml  Output      0 ml  Net    120 ml      LBM: 07/29/14      Physical Exam:  General: NAD, thin, frail  HEENT: [place]/AT, no JVD, moist mucous membranes  Chest: No labored breathing, symmetric  CVS: RRR  Abdomen: Soft, NT, ND  Ext: Left sided weakness, no edema  Neuro: Awake, alert, oriented to self, able to follow simple commands   Labs: CBC    Component Value Date/Time   WBC 5.2 07/31/2014 0500   RBC 3.20* 07/31/2014 0500   HGB 9.5* 07/31/2014 0500   HCT 29.3* 07/31/2014 0500   PLT 376 07/31/2014 0500   MCV 91.6 07/31/2014 0500   MCH 29.7 07/31/2014 0500   MCHC 32.4 07/31/2014 0500   RDW 14.2 07/31/2014 0500   LYMPHSABS 0.7 07/30/2014 0954   MONOABS 0.6 07/30/2014 0954   EOSABS 0.1 07/30/2014 0954   BASOSABS 0.0 07/30/2014 0954  BMET    Component Value Date/Time   NA 135* 07/31/2014 0500   K 4.4 07/31/2014 0500   CL 96 07/31/2014 0500   CO2 29 07/31/2014 0500   GLUCOSE 83 07/31/2014 0500   BUN 22 07/31/2014 0500   CREATININE 0.94 07/31/2014 0500   CALCIUM 8.9 07/31/2014 0500   GFRNONAA 75* 07/31/2014 0500   GFRAA 87* 07/31/2014 0500    CMP     Component Value Date/Time   NA 135* 07/31/2014 0500   K 4.4 07/31/2014 0500   CL 96 07/31/2014 0500   CO2 29 07/31/2014 0500   GLUCOSE 83 07/31/2014 0500   BUN 22 07/31/2014 0500   CREATININE 0.94 07/31/2014 0500   CALCIUM 8.9 07/31/2014 0500   PROT 6.8 07/30/2014 0954   ALBUMIN 2.8* 07/30/2014 0954   AST 20 07/30/2014 0954   ALT 15 07/30/2014 0954   ALKPHOS 95 07/30/2014 0954   BILITOT 0.2* 07/30/2014 0954   GFRNONAA 75* 07/31/2014 0500   GFRAA 87* 07/31/2014 0500     Assessment and Plan:  1. Code Status: DNR 2. Symptom Control:   1. Pain: Acetaminophen prn. 2. Bowel Regimen: Colace BID. Senokot-S qhs prn.  3. Cough: Robitussin DM prn.  4. Nausea: Ondansetron prn.  3. Psycho/Social: Emotional support provided to patient and Mrs. Carlota Raspberry.  4. Disposition: SNF rehab.    Patient Documents Completed or Given: Document Given Completed  Advanced Directives Pkt    MOST    DNR    Gone from My Sight    Hard Choices yes     Time In Time Out Total Time Spent with Patient Total Overall Time  0920 0950 6min 27min    Greater than 50%  of this time was spent counseling and coordinating care related to the above assessment and plan.  Vinie Sill, NP Palliative Medicine Team Pager # [phone] (M-F 8a-5p) Team Phone # [phone] (Nights/Weekends)   1

## 2014-08-05 NOTE — Progress Notes (Signed)
Physical Therapy Treatment Patient Details Name: [name] MRN: [id number] DOB: February 13, 1927 Today's Date: 08/05/2014    History of Present Illness 80 y.o. male admitted to [facility] on 07/30/14 from SNF where he was participating in rehab after recent admission on 7/31.  He presented with L sided weakness.  Stroke workup in progress.  MRI revealed right corona radiata infarct.  Pt with significant PMHx of anemia, HTN, A-fib, COPD, stroke/TIA, and bladder CA,.      PT Comments    Pt is progressing slowly, but daily with PT.  He was in less pain during our session today than earlier reported sessions.  We were able to get him to a more fully upright posture in standing and he took one step to transfer to the recliner chair today supported.  He continues to work very hard during our sessions, and even painful and fatigued he participates fully and without complaint.  He continues to be appropriate for SNF level rehab at discharge.   PT to follow acutely.     Follow Up Recommendations  SNF     Equipment Recommendations  Wheelchair (measurements PT);Wheelchair cushion (measurements PT);Hospital bed;Other (comment) (hoyer lift.)    Recommendations for Other Services  NA     Precautions / Restrictions Precautions Precautions: Fall Precaution Comments: left sided weakness    Mobility  Bed Mobility Overal bed mobility: Needs Assistance Bed Mobility: Supine to Sit Rolling: +2 for physical assistance;Max assist         General bed mobility comments: Two person max assist to get to sitting EOB.  Assist needed on hils left side and max verbal and tactile cues for his to move his right, better leg.  Slow to process and slow to initiate without verbal, tactile, and manual assist.  Trunk needed support throughout transition as well.    Transfers Overall transfer level: Needs assistance Equipment used: 2 person hand held assist Transfers: Sit to/from [employer] Sit to Stand: +2  physical assistance;Max assist   Squat pivot transfers: +2 physical assistance;Max assist     General transfer comment: Two person max assist to stand from both the bed and the recliner chair with right upper extremity supported on the therapist and the chair armrest.  Pt needed support at his trunk to anteriorly weight shift and decrease pushing to the left.  Pt's left leg blocked and cues and maual support needed to achieve semi-upright posture in standing.  Pt able with max support to take one step to squat towards the recliner chair on his right side while reaching with his right arm for the armrests.   Ambulation/Gait             General Gait Details: unable at this time       Modified Rankin (Stroke Patients Only) Modified Rankin (Stroke Patients Only) Pre-Morbid Rankin Score: Moderately severe disability Modified Rankin: Severe disability     Balance Overall balance assessment: Needs assistance Sitting-balance support: Single extremity supported;Feet supported Sitting balance-Leahy Scale: Poor Sitting balance - Comments: max assist to maintain static sitting and dynamic sitting balance.  Worked EOB on upright posture, reaching to the right and down to decrease posterior and left pusing.   Postural control: Posterior lean;Left lateral lean Standing balance support: Single extremity supported Standing balance-Leahy Scale: Zero Standing balance comment: two person max assist for static and dynamic standing activities.                     Cognition Arousal/Alertness:  Awake/alert Behavior During Therapy: Flat affect Overall Cognitive Status: Impaired/Different from baseline Area of Impairment: Attention;Memory;Following commands;Awareness;Safety/judgement;Problem solving   Current Attention Level: Focused Memory: Decreased short-term memory Following Commands: Follows one step commands inconsistently;Follows one step commands with increased time (with max verbal  and tactile cues on right side only) Safety/Judgement: Decreased awareness of safety;Decreased awareness of deficits Awareness: Intellectual Problem Solving: Slow processing;Decreased initiation;Difficulty sequencing;Requires verbal cues;Requires tactile cues General Comments: Pt does not recognize his left side even with repeated tactile cues on that side.                   Pertinent Vitals/Pain Pain Assessment: Faces Faces Pain Scale: Hurts little more Pain Location: generalized Pain Intervention(s): Limited activity within patient's tolerance;Monitored during session;Repositioned                                      PT Goals (current goals can now be found in the care plan section) Acute Rehab PT Goals Patient Stated Goal: unable to state Progress towards PT goals: Progressing toward goals    Frequency  Min 3X/week    PT Plan Current plan remains appropriate       End of Session Equipment Utilized During Treatment: Gait belt Activity Tolerance: Patient limited by pain Patient left: in chair;with call bell/phone within reach;with chair alarm set;with family/visitor present     Time: 1202-1226 PT Time Calculation (min): 24 min  Charges:  $Therapeutic Activity: 8-22 mins $Neuromuscular Re-education: 8-22 mins                        Aslynn Brunetti B. [place], [place], DPT [phone]   08/05/2014, 1:18 PM

## 2014-08-05 NOTE — Clinical Social Work Note (Signed)
Discharge summary has been faxed to [facility]. Discharge packet is complete and placed on pt's shadow chart. Pt's son, Lennette Bihari, updated via phone. Pt's wife updated at bedside. CSW consulted with RN regarding readiness for discharge. CSW has arranged for transportation via EMS ([facility]).   RN to please call report to [facility] at [place], MSW, [facility] Licensed Clinical Social Worker [phone] and [phone]

## 2014-08-05 NOTE — Progress Notes (Signed)
Rehab admissions - On this past Friday afternoon, I called [employer] customer service to appeal the denial.  I gave them information requesting an expedited appeal.  Friday evening late I received a call back from [place].  I was told they would "overnight" information to me to complete.  I have not received the overnight information.  At this point, I do not feel that we have grounds for an appeal.  I agree that patient is too low level and needs SNF at this point.  I also have a faxed denial letter from [facility] from Saturday morning, 08/03/14 at 0913 and I have made case manager aware of receipt of denial letter.  Family are not in the room at this time.  Call me for questions.  #[phone]

## 2014-08-06 ENCOUNTER — Non-Acute Institutional Stay (SKILLED_NURSING_FACILITY): Payer: Medicare Other | Admitting: Adult Health

## 2014-08-06 ENCOUNTER — Encounter: Payer: Self-pay | Admitting: Adult Health

## 2014-08-06 ENCOUNTER — Ambulatory Visit: Payer: Medicare Other | Admitting: Cardiology

## 2014-08-06 DIAGNOSIS — R1314 Dysphagia, pharyngoesophageal phase: Secondary | ICD-10-CM

## 2014-08-06 DIAGNOSIS — N390 Urinary tract infection, site not specified: Secondary | ICD-10-CM

## 2014-08-06 DIAGNOSIS — I4891 Unspecified atrial fibrillation: Secondary | ICD-10-CM

## 2014-08-06 DIAGNOSIS — I6350 Cerebral infarction due to unspecified occlusion or stenosis of unspecified cerebral artery: Secondary | ICD-10-CM

## 2014-08-06 DIAGNOSIS — R5381 Other malaise: Secondary | ICD-10-CM

## 2014-08-06 DIAGNOSIS — R319 Hematuria, unspecified: Secondary | ICD-10-CM

## 2014-08-06 DIAGNOSIS — I639 Cerebral infarction, unspecified: Secondary | ICD-10-CM

## 2014-08-06 DIAGNOSIS — I69354 Hemiplegia and hemiparesis following cerebral infarction affecting left non-dominant side: Secondary | ICD-10-CM

## 2014-08-06 DIAGNOSIS — I69959 Hemiplegia and hemiparesis following unspecified cerebrovascular disease affecting unspecified side: Secondary | ICD-10-CM

## 2014-08-06 DIAGNOSIS — E785 Hyperlipidemia, unspecified: Secondary | ICD-10-CM

## 2014-08-06 LAB — URINE CULTURE: Special Requests: NORMAL

## 2014-08-06 NOTE — Progress Notes (Signed)
Patient ID: [name], male   DOB: 09/30/1927, 80 y.o.   MRN: [id number]     [facility]  Allergies  Allergen Reactions  . Azithromycin Other (See Comments)    Altered mental status  . Diazepam Other (See Comments)    REACTION: agitation  . Ezetimibe-Simvastatin Other (See Comments)    Unknown, wife thinks it's muscle pain  . Morphine Other (See Comments)    Hallucinations.  . Prednisone Other (See Comments)    Dizziness  . Sulfonamide Derivatives Hives     Chief Complaint  Patient presents with  . Hospitalization Follow-up    HPI:  He has been hospitalized for an acute cva which has left him with left hemiparesis. He has a uti for which he is presently taking cipro and macrobid. At this time his goal remains for short term rehab and to return home with his wife. He is unable to participate in the hpi or ros. There are no concerns being voiced by the nursing staff at this time.      Past Medical History  Diagnosis Date  . Bladder cancer     a. Dr. Lawerance Bach - [facility]. Multiple surgeries, chemo trial.  . Hyperlipidemia   . Anemia 04/10/2009  . Immune thrombocytopenic purpura     a. s/p splenectomy.  Marland Kitchen Anxiety   . Hypertension   . Permanent atrial fibrillation     a. Historically difficult to control rates  . COPD   . TIA (transient ischemic attack)     a. H/o TIA 2007.  . Stroke     a. H/o remote lacunar infarcts, at least 3 of them. b. Right lenticular nucleus and corona radiata infarct, cardioembolic 12/1939. c. Stroke 11/2013, new infarct 2 weeks later 12/2013.  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 Gross hematuria     a. H/o bladder CA with intermittent gross hematuria.  Marland Kitchen Dysphagia   . Anemia     Past Surgical History  Procedure Laterality Date  . Splenectomy    . Bilateral vats ablation    . Facial cancer      facial skin cancer    VITAL SIGNS BP 112/75  Pulse  68  Ht 5\' 9"  (1.753 m)  Wt 127 lb (57.607 kg)  BMI 18.75 kg/m2   Patient's Medications  New Prescriptions   No medications on file  Previous Medications   AMIODARONE (PACERONE) 200 MG TABLET    Take 1 tablet (200 mg total) by mouth 2 (two) times daily. Present dose for 2 weeks then 200 mg daily after 2 weeks.   APIXABAN (ELIQUIS) 2.5 MG TABS TABLET    Take 1 tablet (2.5 mg total) by mouth 2 (two) times daily.   ATORVASTATIN (LIPITOR) 40 MG TABLET    Take 1 tablet (40 mg total) by mouth daily at 6 PM.   BACITRACIN-POLYMYXIN B (POLYSPORIN) OPHTHALMIC OINTMENT    Place into both eyes every 4 (four) hours. apply to eye every 12 hours while awake   CHOLECALCIFEROL (VITAMIN D) 2000 UNITS TABLET    Take 2,000 Units by mouth daily.    CIPROFLOXACIN (CIPRO) 500 MG TABLET    Take 1 tablet (500 mg total) by mouth 2 (two) times daily.   DIGOXIN (LANOXIN) 0.125 MG TABLET    Take 1 tablet (0.125 mg total) by mouth daily.   DILTIAZEM (CARDIZEM CD) 180 MG 24 HR CAPSULE  Take 1 capsule (180 mg total) by mouth daily.   DOCUSATE SODIUM 100 MG CAPS    Take 200 mg by mouth 2 (two) times daily.   FOOD THICKENER (THICK IT) POWD    Take 1 Container by mouth as needed. HONEY THICK   NITROFURANTOIN, MACROCRYSTAL-MONOHYDRATE, (MACROBID) 100 MG CAPSULE    Take 1 capsule (100 mg total) by mouth every 12 (twelve) hours.  Modified Medications   No medications on file  Discontinued Medications   No medications on file    SIGNIFICANT DIAGNOSTIC EXAMS  07-20-14: ct of head: 1. Stable age related cerebral atrophy, ventriculomegaly and periventricular white matter disease. 2. Remote lacunar-type basal ganglia infarcts an remote left PCA infarct. 3. No acute intracranial findings, mass lesion or skull fracture.  07-20-14: chest x-ray: Underlying emphysema with areas of lung scarring. No edema or consolidation.  07-25-14: mri of head: Atrophy and moderately severe chronic ischemic change. Chronic left occipital infarct  as noted on the prior study. Small area of diffusion hyperintensity on the posterior aspect of this infarct. Favor chronic blood products versus a small area of acute infarct posterior to the encephalomalacia.  07-29-14: chest x-ray: minimal basilar subsegmental atelectatic changes but otherwise negative for focal pneumonia. 2. Negative for chf.     LABS REVIEWED:   07-20-14: wbc 8.7; hgb 10.8; hct 32.1; mcv 89.7; plt 217; glucose 99; bun 21; creat 0.91; k+4.2; na++134;liver normal albumin 3.1; dig <0.3; Urine culture: 45,000 units: mixed bacteria 07-24-14; wbc 7.1; hgb 10.0; hct 30.1; mcv 88.5; lt 251; glucose 100 ;bun 22; creat 0.9; k+4.6; na++131  07-26-14: tsh 3.980; free t4: 1.9  07-30-14: wbc 5.4; hgb 9.6; hct 28.3; mcv 87.3; plt 357; glucose 97; bun 28; creat 1.01; k+4.7; na++132; liver normal albumin 2.8; inr 2.13; urine culture: pseudomonas aeruginosa and enterococcus species  07-31-14: wbc 5.2; hgb 9.5; hct 29.3; mcv 91.6; plt 326; glucose 83; bun 22; creat 0.94; k+4.4; na++135; chol 126; ldl 76; trig 40; hgb a1c 5.9      Review of Systems  Unable to perform ROS     Physical Exam  Constitutional: No distress.  frail  Eyes: Conjunctivae are normal. Pupils are equal, round, and reactive to light.  Neck: Neck supple. No JVD present. No thyromegaly present.  Cardiovascular: Normal rate, regular rhythm and intact distal pulses.   Heart rate regular   Respiratory: Effort normal and breath sounds normal. No respiratory distress. He has no wheezes.  GI: Soft. Bowel sounds are normal. He exhibits no distension. There is no tenderness.  Musculoskeletal: He exhibits no edema.  Is able to move right side extremities; is unable to move left extremities  has generalized weakness present.   Neurological: He is alert.  Skin: Skin is warm and dry. He is not diaphoretic.       ASSESSMENT/ PLAN:   1. UTI: will have him complete his cipro and macrobid and will continue to monitor his status.  He doe shave chronic hematuria due to bladder cancer. Will continue to monitor his status. He is on long term eliquis; which was changed from [place] while he was in the hospital in order to help reduce his bleeding.    2. Afib: his heart rate is stable; he is presently taking amiodarone 200 mg twice daily through 08-09-14 will then begin amiodarone 200 mg daily. Will continue digoxin 0.125 mg daily; both for rate control. Will continue cardizem cd 180 mg; will continue eliquis 2.5 mg twice daily and will monitor  3. CVA has left hemiparesis: will continue therapy as directed; will continue eliquis 2.5 mg twice daily and will monitor  4. Constipation: will continue colace 200 mg twice daily   5. Physical deconditioning: will continue therapy as directed to improve upon his strength; gait; mobility and independence with his adl's. His goal remains at this time to return home with family.   6. Dyslipidemia: will continue his lipitor 40 mg daily and will monitor his status.   7. Dysphagia: he is being seen by speech therapy; he will begin honey thick liquids; no signs of aspiration present; will continue to monitor his status.    Time spent with patient 50 minutes.      Ok Edwards NP [facility]  Contact [phone] Monday through Friday 8am- 5pm  After hours call [phone]

## 2014-08-08 ENCOUNTER — Encounter: Payer: Self-pay | Admitting: Internal Medicine

## 2014-08-08 ENCOUNTER — Non-Acute Institutional Stay (SKILLED_NURSING_FACILITY): Payer: Medicare Other | Admitting: Internal Medicine

## 2014-08-08 DIAGNOSIS — N390 Urinary tract infection, site not specified: Secondary | ICD-10-CM

## 2014-08-08 DIAGNOSIS — R531 Weakness: Secondary | ICD-10-CM

## 2014-08-08 DIAGNOSIS — I6350 Cerebral infarction due to unspecified occlusion or stenosis of unspecified cerebral artery: Secondary | ICD-10-CM

## 2014-08-08 DIAGNOSIS — I4891 Unspecified atrial fibrillation: Secondary | ICD-10-CM

## 2014-08-08 DIAGNOSIS — R5381 Other malaise: Secondary | ICD-10-CM

## 2014-08-08 DIAGNOSIS — E785 Hyperlipidemia, unspecified: Secondary | ICD-10-CM

## 2014-08-08 DIAGNOSIS — R1314 Dysphagia, pharyngoesophageal phase: Secondary | ICD-10-CM

## 2014-08-08 DIAGNOSIS — R5383 Other fatigue: Secondary | ICD-10-CM

## 2014-08-08 DIAGNOSIS — I639 Cerebral infarction, unspecified: Secondary | ICD-10-CM

## 2014-08-08 DIAGNOSIS — I482 Chronic atrial fibrillation, unspecified: Secondary | ICD-10-CM

## 2014-08-08 DIAGNOSIS — K5909 Other constipation: Secondary | ICD-10-CM

## 2014-08-08 LAB — CULTURE, BLOOD (ROUTINE X 2)
Culture: NO GROWTH
Culture: NO GROWTH

## 2014-08-08 NOTE — Progress Notes (Signed)
Patient ID: [name], male   DOB: December 06, 1927, 80 y.o.   MRN: [id number]     Facility: [facility]    PCP: Cathlean Cower, MD   Allergies  Allergen Reactions  . Azithromycin Other (See Comments)    Altered mental status  . Diazepam Other (See Comments)    REACTION: agitation  . Ezetimibe-Simvastatin Other (See Comments)    Unknown, wife thinks it's muscle pain  . Morphine Other (See Comments)    Hallucinations.  . Prednisone Other (See Comments)    Dizziness  . Sulfonamide Derivatives Hives    Chief Complaint: new admission  HPI:  80 y/o male patient is here for rehabilitation after hospital admission from 07/30/14-08/05/14 with acute CVA. He now has left sided hemiparesis. He was diagnosed to have uti with pseudomonas and e.coli in hospital. He was placed on dysphagia diet and started on eliquis. He has muscle spasm and pain in his joints He has been Sleeping well Working well with therapy team. Has generalized weakness No falls reported Has problem with bowel movement Son and wife are present in the room  Review of Systems:  Constitutional: Negative for fever, chills, diaphoresis.  HENT: Negative for congestion Respiratory: Negative for cough, shortness of breath  Cardiovascular: Negative for chest pain Gastrointestinal: Negative for heartburn, nausea, vomiting, abdominal pain. Has constipation.  Genitourinary: Negative for dysuria Skin: Negative for itching and rash.  Neurological: Negative for dizziness   Past Medical History  Diagnosis Date  . Bladder cancer     a. Dr. Lawerance Bach - [facility]. Multiple surgeries, chemo trial.  . Hyperlipidemia   . Anemia 04/10/2009  . Immune thrombocytopenic purpura     a. s/p splenectomy.  Marland Kitchen Anxiety   . Hypertension   . Permanent atrial fibrillation     a. Historically difficult to control rates  . COPD   . TIA (transient ischemic attack)     a. H/o TIA 2007.  . Stroke     a. H/o remote lacunar  infarcts, at least 3 of them. b. Right lenticular nucleus and corona radiata infarct, cardioembolic 01/4267. c. Stroke 11/2013, new infarct 2 weeks later 12/2013.  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 Gross hematuria     a. H/o bladder CA with intermittent gross hematuria.  Marland Kitchen Dysphagia   . Anemia    Past Surgical History  Procedure Laterality Date  . Splenectomy    . Bilateral vats ablation    . Facial cancer      facial skin cancer   Social History:   reports that he has never smoked. He has never used smokeless tobacco. He reports that he does not drink alcohol or use illicit drugs.  Family History  Problem Relation Age of Onset  . Heart disease Mother   . Arthritis Father     Medications: Patient's Medications  New Prescriptions   No medications on file  Previous Medications   AMIODARONE (PACERONE) 200 MG TABLET    Take 1 tablet (200 mg total) by mouth 2 (two) times daily. Present dose for 2 weeks then 200 mg daily after 2 weeks.   APIXABAN (ELIQUIS) 2.5 MG TABS TABLET    Take 1 tablet (2.5 mg total) by mouth 2 (two) times daily.   ATORVASTATIN (LIPITOR) 40 MG TABLET    Take 1 tablet (40 mg total) by mouth daily at 6 PM.   BACITRACIN-POLYMYXIN  B (POLYSPORIN) OPHTHALMIC OINTMENT    Place into both eyes every 4 (four) hours. apply to eye every 12 hours while awake   CHOLECALCIFEROL (VITAMIN D) 2000 UNITS TABLET    Take 2,000 Units by mouth daily.    CIPROFLOXACIN (CIPRO) 500 MG TABLET    Take 1 tablet (500 mg total) by mouth 2 (two) times daily.   DIGOXIN (LANOXIN) 0.125 MG TABLET    Take 1 tablet (0.125 mg total) by mouth daily.   DILTIAZEM (CARDIZEM CD) 180 MG 24 HR CAPSULE    Take 1 capsule (180 mg total) by mouth daily.   DOCUSATE SODIUM 100 MG CAPS    Take 200 mg by mouth 2 (two) times daily.   FOOD THICKENER (THICK IT) POWD    Take 1 Container by mouth as needed. HONEY THICK    NITROFURANTOIN, MACROCRYSTAL-MONOHYDRATE, (MACROBID) 100 MG CAPSULE    Take 1 capsule (100 mg total) by mouth every 12 (twelve) hours.  Modified Medications   No medications on file  Discontinued Medications   No medications on file     Physical Exam:  Filed Vitals:   08/08/14 1843  BP: 115/68  Pulse: 60  Temp: 99 F (37.2 C)  Resp: 18  SpO2: 98%   General- elderly male in no acute distress, frail Head- atraumatic, normocephalic Eyes- no pallor, no icterus, no discharge Neck- no lymphadenopathy, no thyromegaly Throat- moist mucus membrane Cardiovascular- normal s1,s2, no murmurs Respiratory- bilateral clear to auscultation, no wheeze, no rhonchi, no crackles Abdomen- bowel sounds present, soft, non tender Musculoskeletal- able to move right side, unable to move his left side. No leg edema  Neurological- alert Skin- warm and dry, blanchable redness in buttock area Psychiatry- normal mood and affect   Labs reviewed: Basic Metabolic Panel:  Recent Labs  01/06/14 0355 01/07/14 0445  07/24/14 0430 07/26/14 0700 07/30/14 0954 07/31/14 0500  NA 137 136*  < > 131*  --  132* 135*  K 3.6* 3.8  < > 4.6  --  4.7 4.4  CL 98 98  < > 91*  --  93* 96  CO2 29 27  < > 28  --  30 29  GLUCOSE 86 90  < > 100*  --  97 83  BUN 17 11  < > 22  --  28* 22  CREATININE 0.82 0.77  < > 0.90  --  1.01 0.94  CALCIUM 8.3* 8.4  < > 8.7  --  9.0 8.9  MG  --  1.9  --   --  1.9  --   --   < > = values in this interval not displayed. Liver Function Tests:  Recent Labs  07/20/14 1132 07/21/14 0022 07/30/14 0954  AST 17 27 20   ALT 9 13 15   ALKPHOS 89 81 95  BILITOT 0.7 0.5 0.2*  PROT 7.2 6.6 6.8  ALBUMIN 3.1* 2.6* 2.8*    Recent Labs  07/20/14 1132  LIPASE 22   No results found for this basename: AMMONIA,  in the last 8760 hours CBC:  Recent Labs  05/02/14 1137 07/20/14 1132  07/24/14 0430 07/30/14 0954 07/31/14 0500  WBC 5.9 8.7  < > 7.1 5.4 5.2  NEUTROABS 3.4 7.4  --    --  4.0  --   HGB 11.6* 10.8*  < > 10.0* 9.6* 9.5*  HCT 35.8* 32.1*  < > 30.1* 28.3* 29.3*  MCV 98.2 89.7  < > 88.5 87.3 91.6  PLT 207.0 217  < >  251 357 376  < > = values in this interval not displayed. Cardiac Enzymes:  Recent Labs  07/20/14 1820 07/21/14 0022 07/21/14 0615  TROPONINI <0.30 <0.30 <0.30   BNP: No components found with this basename: POCBNP,  CBG:  Recent Labs  08/05/14 0649 08/05/14 1135 08/05/14 1623  GLUCAP 102* 131* 111*    Radiological Exams: 07-20-14: ct of head: 1. Stable age related cerebral atrophy, ventriculomegaly and periventricular white matter disease. 2. Remote lacunar-type basal ganglia infarcts an remote left PCA infarct. 3. No acute intracranial findings, mass lesion or skull fracture.  07-20-14: chest x-ray: Underlying emphysema with areas of lung scarring. No edema or consolidation.  07-25-14: mri of head: Atrophy and moderately severe chronic ischemic change. Chronic left occipital infarct as noted on the prior study. Small area of diffusion hyperintensity on the posterior aspect of this infarct. Favor chronic blood products versus a small area of acute infarct posterior to the encephalomalacia.  07-29-14: chest x-ray: minimal basilar subsegmental atelectatic changes but otherwise negative for focal pneumonia. 2. Negative for chf.      Assessment/Plan  Generalized weakness Will have him work with physical therapy and occupational therapy team to help with gait training and muscle strengthening exercises.fall precautions. Skin care. Encourage to be out of bed. Gel overlay balmex to buttock and groin for blanchable redness. Float heels with pillow q shift  cva With left hemiparesis. Will have him work with PT and OT, continue eliquis 2.5 mg bid. Add tylenol xs 1000 mg bid and flexeril 10 mg bid for pain and muscle spasm, morning dose half an hour prior to therapy and evening dose an hour prior to bedtime  afib Rate controlled. Continue  amiodarone, digoxin and cardizem with eliquis. Monitor for signs of bleeding  Constipation On Dulcolax suppository daily prn and Senna s 2 tab bid. Will add miralax on daily basis for now  Dysphagia Aspiration precautions, f/u with SLP team  UTI To complete his macrobid and ciprofloxacin tomorrow. Has hx of bladder cancer  Dyslipidemia continue lipitor 40 mg daily   Family/ staff Communication: reviewed care plan with patient and nursing supervisor  Goals of care: short term rehabilitation   Labs/tests ordered: cbc, cmp    Blanchie Serve, MD  [facility] [phone] (Monday-Friday 8 am - 5 pm) [phone] (afterhours)

## 2014-08-10 ENCOUNTER — Encounter (HOSPITAL_COMMUNITY): Payer: Self-pay | Admitting: Emergency Medicine

## 2014-08-10 ENCOUNTER — Inpatient Hospital Stay (HOSPITAL_COMMUNITY)
Admission: EM | Admit: 2014-08-10 | Discharge: 2014-08-16 | DRG: 193 | Disposition: A | Discharge status: Skilled Nursing Facility | Payer: Medicare Other | Attending: Internal Medicine | Admitting: Internal Medicine

## 2014-08-10 ENCOUNTER — Emergency Department (HOSPITAL_COMMUNITY): Payer: Medicare Other

## 2014-08-10 DIAGNOSIS — R5381 Other malaise: Secondary | ICD-10-CM | POA: Diagnosis present

## 2014-08-10 DIAGNOSIS — G9340 Encephalopathy, unspecified: Secondary | ICD-10-CM | POA: Diagnosis present

## 2014-08-10 DIAGNOSIS — G929 Unspecified toxic encephalopathy: Secondary | ICD-10-CM | POA: Diagnosis present

## 2014-08-10 DIAGNOSIS — I6930 Unspecified sequelae of cerebral infarction: Secondary | ICD-10-CM

## 2014-08-10 DIAGNOSIS — I482 Chronic atrial fibrillation, unspecified: Secondary | ICD-10-CM

## 2014-08-10 DIAGNOSIS — I69991 Dysphagia following unspecified cerebrovascular disease: Secondary | ICD-10-CM | POA: Diagnosis not present

## 2014-08-10 DIAGNOSIS — J189 Pneumonia, unspecified organism: Secondary | ICD-10-CM | POA: Diagnosis not present

## 2014-08-10 DIAGNOSIS — E860 Dehydration: Secondary | ICD-10-CM | POA: Diagnosis present

## 2014-08-10 DIAGNOSIS — C679 Malignant neoplasm of bladder, unspecified: Secondary | ICD-10-CM | POA: Diagnosis present

## 2014-08-10 DIAGNOSIS — I10 Essential (primary) hypertension: Secondary | ICD-10-CM | POA: Diagnosis present

## 2014-08-10 DIAGNOSIS — E785 Hyperlipidemia, unspecified: Secondary | ICD-10-CM | POA: Diagnosis present

## 2014-08-10 DIAGNOSIS — I69919 Unspecified symptoms and signs involving cognitive functions following unspecified cerebrovascular disease: Secondary | ICD-10-CM | POA: Diagnosis not present

## 2014-08-10 DIAGNOSIS — G92 Toxic encephalopathy: Secondary | ICD-10-CM | POA: Diagnosis present

## 2014-08-10 DIAGNOSIS — R1314 Dysphagia, pharyngoesophageal phase: Secondary | ICD-10-CM

## 2014-08-10 DIAGNOSIS — N390 Urinary tract infection, site not specified: Secondary | ICD-10-CM | POA: Diagnosis present

## 2014-08-10 DIAGNOSIS — Z85828 Personal history of other malignant neoplasm of skin: Secondary | ICD-10-CM | POA: Diagnosis not present

## 2014-08-10 DIAGNOSIS — R319 Hematuria, unspecified: Secondary | ICD-10-CM | POA: Diagnosis present

## 2014-08-10 DIAGNOSIS — I69354 Hemiplegia and hemiparesis following cerebral infarction affecting left non-dominant side: Secondary | ICD-10-CM

## 2014-08-10 DIAGNOSIS — J4489 Other specified chronic obstructive pulmonary disease: Secondary | ICD-10-CM | POA: Diagnosis present

## 2014-08-10 DIAGNOSIS — I4891 Unspecified atrial fibrillation: Secondary | ICD-10-CM | POA: Diagnosis present

## 2014-08-10 DIAGNOSIS — I69959 Hemiplegia and hemiparesis following unspecified cerebrovascular disease affecting unspecified side: Secondary | ICD-10-CM | POA: Diagnosis not present

## 2014-08-10 DIAGNOSIS — I6990 Unspecified sequelae of unspecified cerebrovascular disease: Secondary | ICD-10-CM

## 2014-08-10 DIAGNOSIS — Z79899 Other long term (current) drug therapy: Secondary | ICD-10-CM | POA: Diagnosis not present

## 2014-08-10 DIAGNOSIS — R5383 Other fatigue: Secondary | ICD-10-CM | POA: Diagnosis not present

## 2014-08-10 DIAGNOSIS — Z7901 Long term (current) use of anticoagulants: Secondary | ICD-10-CM | POA: Diagnosis not present

## 2014-08-10 DIAGNOSIS — R1310 Dysphagia, unspecified: Secondary | ICD-10-CM | POA: Diagnosis present

## 2014-08-10 DIAGNOSIS — D649 Anemia, unspecified: Secondary | ICD-10-CM | POA: Diagnosis present

## 2014-08-10 DIAGNOSIS — Z8551 Personal history of malignant neoplasm of bladder: Secondary | ICD-10-CM

## 2014-08-10 DIAGNOSIS — J449 Chronic obstructive pulmonary disease, unspecified: Secondary | ICD-10-CM | POA: Diagnosis present

## 2014-08-10 DIAGNOSIS — Z66 Do not resuscitate: Secondary | ICD-10-CM | POA: Diagnosis not present

## 2014-08-10 DIAGNOSIS — J690 Pneumonitis due to inhalation of food and vomit: Secondary | ICD-10-CM | POA: Diagnosis not present

## 2014-08-10 DIAGNOSIS — I639 Cerebral infarction, unspecified: Secondary | ICD-10-CM

## 2014-08-10 LAB — URINALYSIS, ROUTINE W REFLEX MICROSCOPIC
GLUCOSE, UA: NEGATIVE mg/dL
Ketones, ur: 15 mg/dL — AB
Nitrite: POSITIVE — AB
Protein, ur: >300 mg/dL — AB
Specific Gravity, Urine: 1.022 (ref 1.005–1.030)
Urobilinogen, UA: 1 mg/dL (ref 0.0–1.0)
pH: 6.5 (ref 5.0–8.0)

## 2014-08-10 LAB — CBC WITH DIFFERENTIAL/PLATELET
BASOS ABS: 0 10*3/uL (ref 0.0–0.1)
Basophils Relative: 0 % (ref 0–1)
EOS ABS: 0.1 10*3/uL (ref 0.0–0.7)
EOS PCT: 2 % (ref 0–5)
HCT: 26.9 % — ABNORMAL LOW (ref 39.0–52.0)
Hemoglobin: 8.7 g/dL — ABNORMAL LOW (ref 13.0–17.0)
Lymphocytes Relative: 9 % — ABNORMAL LOW (ref 12–46)
Lymphs Abs: 0.7 10*3/uL (ref 0.7–4.0)
MCH: 28.6 pg (ref 26.0–34.0)
MCHC: 32.3 g/dL (ref 30.0–36.0)
MCV: 88.5 fL (ref 78.0–100.0)
Monocytes Absolute: 0.6 10*3/uL (ref 0.1–1.0)
Monocytes Relative: 7 % (ref 3–12)
NEUTROS PCT: 82 % — AB (ref 43–77)
Neutro Abs: 6.7 10*3/uL (ref 1.7–7.7)
Platelets: 309 10*3/uL (ref 150–400)
RBC: 3.04 MIL/uL — ABNORMAL LOW (ref 4.22–5.81)
RDW: 14.6 % (ref 11.5–15.5)
WBC: 8.2 10*3/uL (ref 4.0–10.5)

## 2014-08-10 LAB — I-STAT CHEM 8, ED
BUN: 34 mg/dL — AB (ref 6–23)
CALCIUM ION: 1.15 mmol/L (ref 1.13–1.30)
Chloride: 103 mEq/L (ref 96–112)
Creatinine, Ser: 1.1 mg/dL (ref 0.50–1.35)
Glucose, Bld: 97 mg/dL (ref 70–99)
HCT: 30 % — ABNORMAL LOW (ref 39.0–52.0)
HEMOGLOBIN: 10.2 g/dL — AB (ref 13.0–17.0)
Potassium: 4.3 mEq/L (ref 3.7–5.3)
Sodium: 139 mEq/L (ref 137–147)
TCO2: 28 mmol/L (ref 0–100)

## 2014-08-10 LAB — URINE MICROSCOPIC-ADD ON

## 2014-08-10 LAB — COMPREHENSIVE METABOLIC PANEL
ALBUMIN: 2.5 g/dL — AB (ref 3.5–5.2)
ALK PHOS: 99 U/L (ref 39–117)
ALT: 11 U/L (ref 0–53)
AST: 15 U/L (ref 0–37)
Anion gap: 10 (ref 5–15)
BUN: 36 mg/dL — ABNORMAL HIGH (ref 6–23)
CALCIUM: 8.7 mg/dL (ref 8.4–10.5)
CO2: 28 mEq/L (ref 19–32)
Chloride: 103 mEq/L (ref 96–112)
Creatinine, Ser: 1.08 mg/dL (ref 0.50–1.35)
GFR calc non Af Amer: 61 mL/min — ABNORMAL LOW (ref >90)
GFR, EST AFRICAN AMERICAN: 71 mL/min — AB (ref >90)
GLUCOSE: 97 mg/dL (ref 70–99)
POTASSIUM: 4.5 meq/L (ref 3.7–5.3)
SODIUM: 141 meq/L (ref 137–147)
TOTAL PROTEIN: 6.4 g/dL (ref 6.0–8.3)
Total Bilirubin: <0.2 mg/dL — ABNORMAL LOW (ref 0.3–1.2)

## 2014-08-10 LAB — RAPID URINE DRUG SCREEN, HOSP PERFORMED
AMPHETAMINES: NOT DETECTED
BARBITURATES: NOT DETECTED
BENZODIAZEPINES: NOT DETECTED
COCAINE: NOT DETECTED
Opiates: NOT DETECTED
Tetrahydrocannabinol: NOT DETECTED

## 2014-08-10 LAB — ETHANOL: Alcohol, Ethyl (B): <11 mg/dL (ref 0–11)

## 2014-08-10 LAB — CBG MONITORING, ED: Glucose-Capillary: 88 mg/dL (ref 70–99)

## 2014-08-10 LAB — DIGOXIN LEVEL: Digoxin Level: 0.9 ng/mL (ref 0.8–2.0)

## 2014-08-10 LAB — I-STAT CG4 LACTIC ACID, ED: Lactic Acid, Venous: 0.87 mmol/L (ref 0.5–2.2)

## 2014-08-10 LAB — AMMONIA: AMMONIA: 13 umol/L (ref 11–60)

## 2014-08-10 LAB — TROPONIN I: Troponin I: <0.3 ng/mL (ref <0.30)

## 2014-08-10 MED ORDER — POLYMYXIN B-TRIMETHOPRIM 10000-0.1 UNIT/ML-% OP SOLN
1.0000 [drp] | OPHTHALMIC | Status: DC
  Administered 2014-08-11 – 2014-08-16 (×28): 1 [drp] via OPHTHALMIC
  Filled 2014-08-10: qty 10

## 2014-08-10 MED ORDER — APIXABAN 2.5 MG PO TABS
2.5000 mg | ORAL_TABLET | Freq: Two times a day (BID) | ORAL | Status: DC
  Administered 2014-08-11 – 2014-08-16 (×11): 2.5 mg via ORAL
  Filled 2014-08-10 (×15): qty 1

## 2014-08-10 MED ORDER — VITAMIN D 1000 UNITS PO TABS
2000.0000 [IU] | ORAL_TABLET | Freq: Every day | ORAL | Status: DC
  Administered 2014-08-11 – 2014-08-16 (×6): 2000 [IU] via ORAL
  Filled 2014-08-10 (×7): qty 2

## 2014-08-10 MED ORDER — DIGOXIN 125 MCG PO TABS
0.1250 mg | ORAL_TABLET | Freq: Every day | ORAL | Status: DC
  Administered 2014-08-11 – 2014-08-16 (×6): 0.125 mg via ORAL
  Filled 2014-08-10 (×7): qty 1

## 2014-08-10 MED ORDER — DILTIAZEM HCL ER COATED BEADS 180 MG PO CP24
180.0000 mg | ORAL_CAPSULE | Freq: Every day | ORAL | Status: DC
  Administered 2014-08-11 – 2014-08-16 (×6): 180 mg via ORAL
  Filled 2014-08-10 (×7): qty 1

## 2014-08-10 MED ORDER — SODIUM CHLORIDE 0.9 % IJ SOLN
3.0000 mL | Freq: Two times a day (BID) | INTRAMUSCULAR | Status: DC
  Administered 2014-08-11 – 2014-08-15 (×9): 3 mL via INTRAVENOUS

## 2014-08-10 MED ORDER — STARCH (THICKENING) PO POWD
1.0000 | Freq: Three times a day (TID) | ORAL | Status: AC
  Administered 2014-08-11 (×2): 1 via ORAL
  Filled 2014-08-10: qty 227

## 2014-08-10 MED ORDER — VANCOMYCIN HCL IN DEXTROSE 750-5 MG/150ML-% IV SOLN
750.0000 mg | INTRAVENOUS | Status: DC
  Administered 2014-08-11: 750 mg via INTRAVENOUS
  Filled 2014-08-10 (×2): qty 150

## 2014-08-10 MED ORDER — SODIUM CHLORIDE 0.9 % IV SOLN
INTRAVENOUS | Status: DC
  Administered 2014-08-10 – 2014-08-15 (×4): via INTRAVENOUS

## 2014-08-10 MED ORDER — PIPERACILLIN-TAZOBACTAM 3.375 G IVPB
3.3750 g | Freq: Three times a day (TID) | INTRAVENOUS | Status: DC
  Administered 2014-08-11 – 2014-08-12 (×5): 3.375 g via INTRAVENOUS
  Filled 2014-08-10 (×6): qty 50

## 2014-08-10 MED ORDER — OXYCODONE HCL 5 MG PO TABS: 5.0000 mg | ORAL_TABLET | ORAL | Status: DC | PRN

## 2014-08-10 MED ORDER — CLOTRIMAZOLE 1 % EX CREA
TOPICAL_CREAM | Freq: Two times a day (BID) | CUTANEOUS | Status: DC
  Administered 2014-08-11 – 2014-08-15 (×10): via TOPICAL
  Administered 2014-08-16: 1 via TOPICAL
  Filled 2014-08-10 (×2): qty 15

## 2014-08-10 MED ORDER — SODIUM CHLORIDE 0.9 % IV SOLN
1000.0000 mL | Freq: Once | INTRAVENOUS | Status: AC
  Administered 2014-08-10: 1000 mL via INTRAVENOUS

## 2014-08-10 MED ORDER — AMIODARONE HCL 200 MG PO TABS
200.0000 mg | ORAL_TABLET | Freq: Two times a day (BID) | ORAL | Status: DC
  Administered 2014-08-11 – 2014-08-16 (×11): 200 mg via ORAL
  Filled 2014-08-10 (×15): qty 1

## 2014-08-10 MED ORDER — ALBUTEROL SULFATE (2.5 MG/3ML) 0.083% IN NEBU
2.5000 mg | INHALATION_SOLUTION | Freq: Four times a day (QID) | RESPIRATORY_TRACT | Status: DC
Start: 2014-08-10 — End: 2014-08-16
  Administered 2014-08-10 – 2014-08-16 (×22): 2.5 mg via RESPIRATORY_TRACT
  Filled 2014-08-10 (×23): qty 3

## 2014-08-10 MED ORDER — DOCUSATE SODIUM 100 MG PO CAPS
200.0000 mg | ORAL_CAPSULE | Freq: Two times a day (BID) | ORAL | Status: DC
  Administered 2014-08-11 – 2014-08-15 (×9): 200 mg via ORAL
  Filled 2014-08-10 (×14): qty 2

## 2014-08-10 MED ORDER — ATORVASTATIN CALCIUM 40 MG PO TABS
40.0000 mg | ORAL_TABLET | Freq: Every day | ORAL | Status: DC
  Administered 2014-08-11 – 2014-08-15 (×5): 40 mg via ORAL
  Filled 2014-08-10 (×6): qty 1

## 2014-08-10 MED ORDER — PIPERACILLIN-TAZOBACTAM 3.375 G IVPB 30 MIN
3.3750 g | Freq: Once | INTRAVENOUS | Status: AC
  Administered 2014-08-10: 3.375 g via INTRAVENOUS
  Filled 2014-08-10: qty 50

## 2014-08-10 MED ORDER — ACETAMINOPHEN 650 MG RE SUPP
650.0000 mg | Freq: Four times a day (QID) | RECTAL | Status: DC | PRN
  Administered 2014-08-13: 650 mg via RECTAL
  Filled 2014-08-10: qty 1

## 2014-08-10 MED ORDER — VANCOMYCIN HCL 10 G IV SOLR: 20.0000 mg/kg | Freq: Once | INTRAVENOUS | Status: DC

## 2014-08-10 MED ORDER — ALBUTEROL SULFATE (2.5 MG/3ML) 0.083% IN NEBU: 2.5000 mg | INHALATION_SOLUTION | RESPIRATORY_TRACT | Status: DC | PRN

## 2014-08-10 MED ORDER — ONDANSETRON HCL 4 MG PO TABS: 4.0000 mg | ORAL_TABLET | Freq: Four times a day (QID) | ORAL | Status: DC | PRN

## 2014-08-10 MED ORDER — BACITRACIN-POLYMYXIN B 500-10000 UNIT/GM OP OINT
TOPICAL_OINTMENT | OPHTHALMIC | Status: DC
  Filled 2014-08-10: qty 3.5

## 2014-08-10 MED ORDER — VANCOMYCIN HCL 10 G IV SOLR
1250.0000 mg | Freq: Once | INTRAVENOUS | Status: AC
  Administered 2014-08-10: 1250 mg via INTRAVENOUS
  Filled 2014-08-10: qty 1250

## 2014-08-10 MED ORDER — ALUM & MAG HYDROXIDE-SIMETH 200-200-20 MG/5ML PO SUSP: 30.0000 mL | Freq: Four times a day (QID) | ORAL | Status: DC | PRN

## 2014-08-10 MED ORDER — ACETAMINOPHEN 325 MG PO TABS
650.0000 mg | ORAL_TABLET | Freq: Four times a day (QID) | ORAL | Status: DC | PRN
  Administered 2014-08-12: 650 mg via ORAL
  Filled 2014-08-10: qty 2

## 2014-08-10 MED ORDER — HYDROMORPHONE HCL PF 1 MG/ML IJ SOLN
0.5000 mg | INTRAMUSCULAR | Status: DC | PRN
  Administered 2014-08-10: 1 mg via INTRAVENOUS
  Filled 2014-08-10: qty 1

## 2014-08-10 MED ORDER — ONDANSETRON HCL 4 MG/2ML IJ SOLN: 4.0000 mg | Freq: Four times a day (QID) | INTRAMUSCULAR | Status: DC | PRN

## 2014-08-10 NOTE — ED Notes (Signed)
CG-4 result shared with Dr. Standley Dakins

## 2014-08-10 NOTE — ED Provider Notes (Signed)
CSN: [id number]     Arrival date & time 08/10/14  1658 History   First MD Initiated Contact with Patient 08/10/14 1659     Chief Complaint  Patient presents with  . Altered Mental Status     (Consider location/radiation/quality/duration/timing/severity/associated sxs/prior Treatment) Patient is a 80 y.o. male presenting with altered mental status. The history is provided by the patient, the EMS personnel and the spouse. The history is limited by the condition of the patient.  Altered Mental Status Presenting symptoms: behavior changes and disorientation   Presenting symptoms: no confusion   Severity:  Moderate Most recent episode:  Today Episode history:  Multiple Duration:  8 hours Timing:  Constant Progression:  Waxing and waning Chronicity:  Recurrent Context: nursing home resident   Associated symptoms: abdominal pain (chronic suprapubic, unchanged) and slurred speech (chronic)   Associated symptoms: no fever, no headaches, no nausea, no palpitations, no rash and no vomiting    80 yo M with a chief complaint of altered mental status. This started earlier today. Family says that he was looking at a distance and waving his hands as if interacting with something else that wasn't there. He'll denies any fevers or chills for which denies any coughing. Patient recently made a DO NOT RESUSCITATE after multiple CVAs. Patient also with recurrent urinary tract infections. Patient has just finished taking a course of antibiotics. Family says he has been having trouble being treated home as well.   Past Medical History  Diagnosis Date  . Bladder cancer     a. Dr. Lawerance Bach - [facility]. Multiple surgeries, chemo trial.  . Hyperlipidemia   . Anemia 04/10/2009  . Immune thrombocytopenic purpura     a. s/p splenectomy.  Marland Kitchen Anxiety   . Hypertension   . Permanent atrial fibrillation     a. Historically difficult to control rates  . COPD   . TIA (transient ischemic attack)     a. H/o TIA 2007.   . Stroke     a. H/o remote lacunar infarcts, at least 3 of them. b. Right lenticular nucleus and corona radiata infarct, cardioembolic 12/5398. c. Stroke 11/2013, new infarct 2 weeks later 12/2013.  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 Gross hematuria     a. H/o bladder CA with intermittent gross hematuria.  Marland Kitchen Dysphagia   . Anemia    Past Surgical History  Procedure Laterality Date  . Splenectomy    . Bilateral vats ablation    . Facial cancer      facial skin cancer   Family History  Problem Relation Age of Onset  . Heart disease Mother   . Arthritis Father    History  Substance Use Topics  . Smoking status: Never Smoker   . Smokeless tobacco: Never Used  . Alcohol Use: No    Review of Systems  Constitutional: Negative for fever and chills.  HENT: Negative for congestion and facial swelling.   Eyes: Negative for discharge and visual disturbance.  Respiratory: Negative for shortness of breath.   Cardiovascular: Negative for chest pain and palpitations.  Gastrointestinal: Positive for abdominal pain (chronic suprapubic, unchanged). Negative for nausea, vomiting and diarrhea.  Musculoskeletal: Negative for arthralgias and myalgias.  Skin: Negative for color change and rash.  Neurological: Negative for tremors, syncope and headaches.  Psychiatric/Behavioral: Negative for confusion and dysphoric mood.      Allergies  Azithromycin; Diazepam; Ezetimibe-simvastatin; Morphine;  Prednisone; and Sulfonamide derivatives  Home Medications   Prior to Admission medications   Medication Sig Start Date End Date Taking? Authorizing [place] Para  amiodarone (PACERONE) 200 MG tablet Take 1 tablet (200 mg total) by mouth 2 (two) times daily. Present dose for 2 weeks then 200 mg daily after 2 weeks. 07/26/14  Yes Thurnell Lose, MD  apixaban (ELIQUIS) 2.5 MG TABS tablet Take 1 tablet (2.5 mg total) by  mouth 2 (two) times daily. 08/04/14  Yes Charlynne Cousins, MD  atorvastatin (LIPITOR) 40 MG tablet Take 1 tablet (40 mg total) by mouth daily at 6 PM. 08/04/14  Yes Charlynne Cousins, MD  bacitracin-polymyxin b (POLYSPORIN) ophthalmic ointment Place into both eyes every 4 (four) hours. apply to eye every 12 hours while awake 07/26/14  Yes Thurnell Lose, MD  Cholecalciferol (VITAMIN D) 2000 UNITS tablet Take 2,000 Units by mouth daily.    Yes Historical Takari Duncombe, MD  digoxin (LANOXIN) 0.125 MG tablet Take 1 tablet (0.125 mg total) by mouth daily. 07/23/14  Yes Donne Hazel, MD  diltiazem (CARDIZEM CD) 180 MG 24 hr capsule Take 1 capsule (180 mg total) by mouth daily. 07/23/14  Yes Donne Hazel, MD  docusate sodium 100 MG CAPS Take 200 mg by mouth 2 (two) times daily. 07/26/14  Yes Thurnell Lose, MD  food thickener (THICK IT) POWD Take 1 Container by mouth as needed. HONEY THICK   Yes Historical Jaquez Farrington, MD  ciprofloxacin (CIPRO) 500 MG tablet Take 500 mg by mouth 2 (two) times daily.    Historical Markitta Ausburn, MD  nitrofurantoin, macrocrystal-monohydrate, (MACROBID) 100 MG capsule Take 100 mg by mouth every 12 (twelve) hours.    Historical Tameko Halder, MD   BP 118/45  Pulse 61  Temp(Src) 99.1 F (37.3 C) (Rectal)  Resp 18  Ht 5' 8.9" (1.75 m)  Wt 126 lb 15.8 oz (57.6 kg)  BMI 18.81 kg/m2  SpO2 100% Physical Exam  Constitutional: He is oriented to person, place, and time. He appears well-developed and well-nourished.  HENT:  Head: Normocephalic and atraumatic.  Eyes: EOM are normal. Pupils are equal, round, and reactive to light.  Neck: Normal range of motion. Neck supple. No JVD present.  Cardiovascular: Normal rate and regular rhythm.  Exam reveals no gallop and no friction rub.   No murmur heard. Pulmonary/Chest: No respiratory distress. He has no wheezes.  Abdominal: He exhibits no distension. There is tenderness (suprapubic). There is guarding. There is no rebound.   Musculoskeletal: Normal range of motion.  Neurological: He is alert and oriented to person, place, and time.  Skin: No rash noted. No pallor.  Psychiatric: He has a normal mood and affect. His behavior is normal.    ED Course  Procedures (including critical care time) Labs Review Labs Reviewed  CBC WITH DIFFERENTIAL - Abnormal; Notable for the following:    RBC 3.04 (*)    Hemoglobin 8.7 (*)    HCT 26.9 (*)    Neutrophils Relative % 82 (*)    Lymphocytes Relative 9 (*)    All other components within normal limits  I-STAT CHEM 8, ED - Abnormal; Notable for the following:    BUN 34 (*)    Hemoglobin 10.2 (*)    HCT 30.0 (*)    All other components within normal limits  URINE CULTURE  AMMONIA  COMPREHENSIVE METABOLIC PANEL  URINE RAPID DRUG SCREEN (HOSP PERFORMED)  ETHANOL  URINALYSIS, ROUTINE W REFLEX MICROSCOPIC  DIGOXIN LEVEL  I-STAT  CG4 LACTIC ACID, ED  CBG MONITORING, ED    Imaging Review Dg Chest Portable 1 View  08/10/2014   CLINICAL DATA:  Altered mental status.  Weakness.  EXAM: PORTABLE CHEST - 1 VIEW  COMPARISON:  08/02/2014  FINDINGS: Hyperinflation. Numerous leads and wires project over the chest. Midline trachea. Moderate cardiomegaly with atherosclerosis in the transverse aorta. Trace right pleural fluid or thickening blunts the costophrenic angle. There is also left greater than right pleural parenchymal scarring at the apices.  No congestive failure. Patchy medial right lung base airspace disease.  IMPRESSION: Medial right lung base airspace disease, suspicious for infection or aspiration.  Cardiomegaly and hyperinflation, without congestive failure.  Trace right pleural fluid or thickening.   Electronically Signed   By: Abigail Miyamoto M.D.   On: 08/10/2014 17:55     EKG Interpretation None      MDM   Final diagnoses:  None    79 yo M with a chief complaint of altered mental status. Patient found to have possible aspiration versus pneumonia in the right  middle lobe.  Patient also found to have a urinary tract infection. Patient covered with vancomycin and Zosyn. We'll admit to hospitalist.    Deno Etienne, MD 08/11/14 [phone]

## 2014-08-10 NOTE — H&P (Signed)
[facility] Admission History and Physical       [name] JJO:[id number] DOB: 05/19/1927 DOA: 08/10/2014  Referring physician: EDP PCP: Cathlean Cower, MD  Specialists:   Chief Complaint: Lethargy  HPI: [name] is a 80 y.o. male with a CVAs with Left Hemiparesis, Dysphagia and Cognitive Deficits, Chronic Atrial fibrillation on Eliquis Rx, Bladder CA , COPD, HTH, S/P Splenectomy currently in [place] place for Rehab Rx who was sent to the ED because of increased lethargic since this AM.  His wife reports that his behavior was different, was not as responsive, and looking up at he ceiling and raising his right arm.   He was evaluated in the ED and found to have  Pneumonia and a UTI and was placed on IV Vanc and Zosyn and referred for medical admission.     Review of Systems:  Unable to Obtain from the Patient  Past Medical History  Diagnosis Date  . Bladder cancer     a. Dr. Lawerance Bach - [facility]. Multiple surgeries, chemo trial.  . Hyperlipidemia   . Anemia 04/10/2009  . Immune thrombocytopenic purpura     a. s/p splenectomy.  Marland Kitchen Anxiety   . Hypertension   . Permanent atrial fibrillation     a. Historically difficult to control rates  . COPD   . TIA (transient ischemic attack)     a. H/o TIA 2007.  . Stroke     a. H/o remote lacunar infarcts, at least 3 of them. b. Right lenticular nucleus and corona radiata infarct, cardioembolic 12/6008. c. Stroke 11/2013, new infarct 2 weeks later 12/2013.  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 Gross hematuria     a. H/o bladder CA with intermittent gross hematuria.  Marland Kitchen Dysphagia   . Anemia      Past Surgical History  Procedure Laterality Date  . Splenectomy    . Bilateral vats ablation    . Facial cancer      facial skin cancer      Prior to Admission medications   Medication Sig Start Date End Date Taking? Authorizing Provider    amiodarone (PACERONE) 200 MG tablet Take 1 tablet (200 mg total) by mouth 2 (two) times daily. Present dose for 2 weeks then 200 mg daily after 2 weeks. 07/26/14  Yes Thurnell Lose, MD  apixaban (ELIQUIS) 2.5 MG TABS tablet Take 1 tablet (2.5 mg total) by mouth 2 (two) times daily. 08/04/14  Yes Charlynne Cousins, MD  atorvastatin (LIPITOR) 40 MG tablet Take 1 tablet (40 mg total) by mouth daily at 6 PM. 08/04/14  Yes Charlynne Cousins, MD  bacitracin-polymyxin b (POLYSPORIN) ophthalmic ointment Place into both eyes every 4 (four) hours. apply to eye every 12 hours while awake 07/26/14  Yes Thurnell Lose, MD  Cholecalciferol (VITAMIN D) 2000 UNITS tablet Take 2,000 Units by mouth daily.    Yes Historical Provider, MD  digoxin (LANOXIN) 0.125 MG tablet Take 1 tablet (0.125 mg total) by mouth daily. 07/23/14  Yes Donne Hazel, MD  diltiazem (CARDIZEM CD) 180 MG 24 hr capsule Take 1 capsule (180 mg total) by mouth daily. 07/23/14  Yes Donne Hazel, MD  docusate sodium 100 MG CAPS Take 200 mg by mouth 2 (two) times daily. 07/26/14  Yes Thurnell Lose, MD  food thickener (THICK IT) POWD Take 1 Container by mouth as needed. HONEY THICK  Yes Historical Provider, MD  ciprofloxacin (CIPRO) 500 MG tablet Take 500 mg by mouth 2 (two) times daily.    Historical Provider, MD  nitrofurantoin, macrocrystal-monohydrate, (MACROBID) 100 MG capsule Take 100 mg by mouth every 12 (twelve) hours.    Historical Provider, MD     Allergies  Allergen Reactions  . Azithromycin Other (See Comments)    Altered mental status  . Diazepam Other (See Comments)    REACTION: agitation  . Ezetimibe-Simvastatin Other (See Comments)    Unknown, wife thinks it's muscle pain  . Morphine Other (See Comments)    Hallucinations.  . Prednisone Other (See Comments)    Dizziness  . Sulfonamide Derivatives Hives    Social History:  reports that he has never smoked. He has never used smokeless tobacco. He reports that he  does not drink alcohol or use illicit drugs.     Family History  Problem Relation Age of Onset  . Heart disease Mother   . Arthritis Father       Physical Exam:  GEN:  Pleasant Elderly Thin 80 y.o. Caucasian male examined  and in no acute distress; cooperative with exam Filed Vitals:   08/10/14 1930 08/10/14 2000 08/10/14 2016 08/10/14 2030  BP: 133/70 145/119 159/81 152/64  Pulse: 78 77  83  Temp:      TempSrc:      Resp: 23 17 22 21   Height:      Weight:      SpO2: 95% 100% 100% 97%   Blood pressure 152/64, pulse 83, temperature 99.1 F (37.3 C), temperature source Rectal, resp. rate 21, height 5' 8.9" (1.75 m), weight 57.6 kg (126 lb 15.8 oz), SpO2 97.00%. PSYCH: He is alert and oriented x1; does not appear anxious does not appear depressed; affect is normal HEENT: Normocephalic and Atraumatic, Mucous membranes pink; PERRLA; EOM intact; Fundi:  Benign;  No scleral icterus, Nares: Patent, Oropharynx: Clear,     Neck:  FROM, No Cervical Lymphadenopathy nor Thyromegaly or Carotid Bruit; No JVD; Breasts:: Not examined CHEST WALL: No tenderness CHEST: Normal respiration, clear to auscultation bilaterally HEART: Regular rate and rhythm; no murmurs rubs or gallops BACK: No kyphosis or scoliosis; No CVA tenderness ABDOMEN: Positive Bowel Sounds, Scaphoid, Soft Non-Tender; No Masses, No Organomegaly.    Rectal Exam: Not done EXTREMITIES: No Cyanosis, Clubbing, or Edema; No Ulcerations. Genitalia: not examined PULSES: 2+ and symmetric SKIN: Normal hydration no rash or ulceration CNS:  Alert and Oriented x 1,   +Left Hemiparesis Vascular: pulses palpable throughout    Labs on Admission:  Basic Metabolic Panel:  Recent Labs Lab 08/10/14 1800 08/10/14 1812  NA 141 139  K 4.5 4.3  CL 103 103  CO2 28  --   GLUCOSE 97 97  BUN 36* 34*  CREATININE 1.08 1.10  CALCIUM 8.7  --    Liver Function Tests:  Recent Labs Lab 08/10/14 1800  AST 15  ALT 11  ALKPHOS 99  BILITOT  <0.2*  PROT 6.4  ALBUMIN 2.5*   No results found for this basename: LIPASE, AMYLASE,  in the last 168 hours  Recent Labs Lab 08/10/14 1722  AMMONIA 13   CBC:  Recent Labs Lab 08/10/14 1800 08/10/14 1812  WBC 8.2  --   NEUTROABS 6.7  --   HGB 8.7* 10.2*  HCT 26.9* 30.0*  MCV 88.5  --   PLT 309  --    Cardiac Enzymes:  Recent Labs Lab 08/10/14 1850  TROPONINI <0.30  BNP (last 3 results) No results found for this basename: PROBNP,  in the last 8760 hours CBG:  Recent Labs Lab 08/04/14 2126 08/05/14 0649 08/05/14 1135 08/05/14 1623 08/10/14 1735  GLUCAP 148* 102* 131* 111* 88    Radiological Exams on Admission: Ct Head Wo Contrast  08/10/2014   CLINICAL DATA:  Altered consciousness.  Lethargy.  EXAM: CT HEAD WITHOUT CONTRAST  TECHNIQUE: Contiguous axial images were obtained from the base of the skull through the vertex without intravenous contrast.  COMPARISON:  07/30/2014 and MRI of 07/30/2014  FINDINGS: Sinuses/Soft tissues: Cerumen in the right external ear canal. Clear paranasal sinuses and mastoid air cells.  Intracranial: Advanced cerebral atrophy. Moderate to marked low density in the periventricular white matter likely related to small vessel disease. Hypoattenuation in the right corona radiata on image 17 is like due to evolving subacute to chronic infarct. Scattered remote lacunar infarcts in the basal ganglia. Remote left occipital lobe cortical based infarct.  No hemorrhage, hydrocephalus, intra-axial, or extra-axial fluid collection.  IMPRESSION: 1.  No acute intracranial abnormality. 2.  Cerebral atrophy and small vessel ischemic change. 3. Subacute to chronic right corona radiata infarct, as detailed on prior MRI.   Electronically Signed   By: Abigail Miyamoto M.D.   On: 08/10/2014 18:26   Dg Chest Portable 1 View  08/10/2014   CLINICAL DATA:  Altered mental status.  Weakness.  EXAM: PORTABLE CHEST - 1 VIEW  COMPARISON:  08/02/2014  FINDINGS:  Hyperinflation. Numerous leads and wires project over the chest. Midline trachea. Moderate cardiomegaly with atherosclerosis in the transverse aorta. Trace right pleural fluid or thickening blunts the costophrenic angle. There is also left greater than right pleural parenchymal scarring at the apices.  No congestive failure. Patchy medial right lung base airspace disease.  IMPRESSION: Medial right lung base airspace disease, suspicious for infection or aspiration.  Cardiomegaly and hyperinflation, without congestive failure.  Trace right pleural fluid or thickening.   Electronically Signed   By: Abigail Miyamoto M.D.   On: 08/10/2014 17:55     Assessment/Plan:   80 y.o. male with  Principal Problem:   1.   HCAP (healthcare-associated pneumonia)   IV Vancomycin and Zosyn   Albuterol Nebs   Monitor O2 sats   Active Problems:   2.   Hematuria   Monitor due to Eliquis Rx     3.   Acute encephalopathy- due to #1 and #4   Monitor       4.   UTI (lower urinary tract infection)- recent Cipro and Nitrofurantoin Rx   Covered by ABx Coverage in #1   Urine C+S Sent     5.   NEOPLASM, MALIGNANT, BLADDER   Hx of Multiple Surgeries   Care at [facility]     6.   HYPERLIPIDEMIA   Continue Statin Rx       7.   HYPERTENSION   Continue Diltiazem,     8.   COPD  Albuterol Nebs   O2 PRN    9.   Atrial fibrillation- Chronic   Continue Amiodarone, Diltiazem, Digoxin, and Eliquis   10.   Late effects of CVA (cerebrovascular accident)   Stable    11.   DVT Prophylaxis   On Eliquis    Code Status:     FULL CODE  Family Communication:   Wife at Bedside Disposition Plan:     Inpatient  Time spent: [facility] Pager [phone]   If  7AM -7PM Please Contact the Day Rounding Team MD for [facility]  If 7PM-7AM, Please Contact night-coverage  [email or web address] Password [email or web address] 08/10/2014, 9:22 PM

## 2014-08-10 NOTE — ED Notes (Signed)
Patient transported to CT 

## 2014-08-10 NOTE — Progress Notes (Signed)
ANTIBIOTIC CONSULT NOTE - INITIAL  Pharmacy Consult for vanc/zosyn Indication: pneumonia  Allergies  Allergen Reactions  . Azithromycin Other (See Comments)    Altered mental status  . Diazepam Other (See Comments)    REACTION: agitation  . Ezetimibe-Simvastatin Other (See Comments)    Unknown, wife thinks it's muscle pain  . Morphine Other (See Comments)    Hallucinations.  . Prednisone Other (See Comments)    Dizziness  . Sulfonamide Derivatives Hives    Patient Measurements: Height: 5' 8.9" (175 cm) Weight: 126 lb 15.8 oz (57.6 kg) IBW/kg (Calculated) : 70.47 Adjusted Body Weight:   Vital Signs: Temp: 99.1 F (37.3 C) (08/15 1800) Temp src: Rectal (08/15 1800) BP: 163/68 mmHg (08/15 2115) Pulse Rate: 95 (08/15 2115) Intake/Output from previous day:   Intake/Output from this shift:    Labs:  Recent Labs  08/10/14 1800 08/10/14 1812  WBC 8.2  --   HGB 8.7* 10.2*  PLT 309  --   CREATININE 1.08 1.10   Estimated Creatinine Clearance: 40.7 ml/min (by C-G formula based on Cr of 1.1). No results found for this basename: Letta Median, VANCORANDOM, GENTTROUGH, GENTPEAK, GENTRANDOM, TOBRATROUGH, TOBRAPEAK, TOBRARND, AMIKACINPEAK, AMIKACINTROU, AMIKACIN,  in the last 72 hours   Microbiology: Recent Results (from the past 720 hour(s))  URINE CULTURE     Status: None   Collection Time    07/20/14 12:37 PM      Result Value Ref Range Status   Specimen Description URINE, CLEAN CATCH   Final   Special Requests NONE   Final   Culture  Setup Time     Final   Value: 07/20/2014 19:21     Performed at [place]     Final   Value: 45,000 COLONIES/ML     Performed at [facility]   Culture     Final   Value: Multiple bacterial morphotypes present, none predominant. Suggest appropriate recollection if clinically indicated.     Performed at [facility]   Report Status 07/21/2014 FINAL   Final  MRSA PCR SCREENING      Status: Abnormal   Collection Time    07/20/14  5:17 PM      Result Value Ref Range Status   MRSA by PCR POSITIVE (*) NEGATIVE Final   Comment:            The GeneXpert MRSA Assay (FDA     approved for NASAL specimens     only), is one component of a     comprehensive MRSA colonization     surveillance program. It is not     intended to diagnose MRSA     infection nor to guide or     monitor treatment for     MRSA infections.     RESULT CALLED TO, READ BACK BY AND VERIFIED WITH:     Hiram Gash RN AT 1909 07/20/14 BY [place]  URINE CULTURE     Status: None   Collection Time    07/30/14 10:45 AM      Result Value Ref Range Status   Specimen Description URINE, RANDOM   Final   Special Requests NONE   Final   Culture  Setup Time     Final   Value: 07/30/2014 17:41     Performed at [place]     Final   Value: >=100,000 COLONIES/ML     Performed at [facility]  Culture     Final   Value: PSEUDOMONAS AERUGINOSA     ENTEROCOCCUS SPECIES     Performed at [facility]   Report Status 08/02/2014 FINAL   Final   Organism ID, Bacteria PSEUDOMONAS AERUGINOSA   Final   Organism ID, Bacteria ENTEROCOCCUS SPECIES   Final  CULTURE, BLOOD (ROUTINE X 2)     Status: None   Collection Time    08/02/14  3:40 PM      Result Value Ref Range Status   Specimen Description BLOOD LEFT ARM   Final   Special Requests BOTTLES DRAWN AEROBIC AND ANAEROBIC 5CC   Final   Culture  Setup Time     Final   Value: 08/02/2014 19:59     Performed at [facility]   Culture     Final   Value: NO GROWTH 5 DAYS     Performed at [facility]   Report Status 08/08/2014 FINAL   Final  CULTURE, BLOOD (ROUTINE X 2)     Status: None   Collection Time    08/02/14  3:50 PM      Result Value Ref Range Status   Specimen Description BLOOD LEFT HAND   Final   Special Requests BOTTLES DRAWN AEROBIC AND ANAEROBIC 5CC   Final   Culture  Setup Time     Final    Value: 08/02/2014 19:58     Performed at [facility]   Culture     Final   Value: NO GROWTH 5 DAYS     Performed at [facility]   Report Status 08/08/2014 FINAL   Final  URINE CULTURE     Status: None   Collection Time    08/02/14  5:32 PM      Result Value Ref Range Status   Specimen Description URINE, CATHETERIZED   Final   Special Requests Normal   Final   Culture  Setup Time     Final   Value: 08/03/2014 00:22     Performed at [place]     Final   Value: >=100,000 COLONIES/ML     Performed at [facility]   Culture     Final   Value: STAPHYLOCOCCUS SPECIES (COAGULASE NEGATIVE)     Note: RIFAMPIN AND GENTAMICIN SHOULD NOT BE USED AS SINGLE DRUGS FOR TREATMENT OF STAPH INFECTIONS.     ENTEROCOCCUS SPECIES     Performed at [facility]   Report Status 08/06/2014 FINAL   Final   Organism ID, Bacteria STAPHYLOCOCCUS SPECIES (COAGULASE NEGATIVE)   Final   Organism ID, Bacteria ENTEROCOCCUS SPECIES   Final    Medical History: Past Medical History  Diagnosis Date  . Bladder cancer     a. Dr. Lawerance Bach - [facility]. Multiple surgeries, chemo trial.  . Hyperlipidemia   . Anemia 04/10/2009  . Immune thrombocytopenic purpura     a. s/p splenectomy.  Marland Kitchen Anxiety   . Hypertension   . Permanent atrial fibrillation     a. Historically difficult to control rates  . COPD   . TIA (transient ischemic attack)     a. H/o TIA 2007.  . Stroke     a. H/o remote lacunar infarcts, at least 3 of them. b. Right lenticular nucleus and corona radiata infarct, cardioembolic 12/6107. c. Stroke 11/2013, new infarct 2 weeks later 12/2013.  Marland Kitchen Pleural effusion     a. s/p video-assisted thoracoscopic surgery in  2002 - fibrothorax.  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 Gross hematuria     a. H/o bladder CA with intermittent gross hematuria.  Marland Kitchen Dysphagia   . Anemia     Medications:  Scheduled:  . albuterol  2.5 mg Nebulization Q6H  . apixaban   2.5 mg Oral BID  . clotrimazole   Topical BID   Infusions:  . sodium chloride 75 mL/hr at 08/10/14 2132   Assessment: 80 yo who presented from [facility] for AMS. Apparently just complete some abx for UTI. Vanc/zosyn have been ordered for PNA. She has gotten some vanc/zosyn in the ED.   Goal of Therapy:  Vancomycin trough level 15-20 mcg/ml  Plan:   Vanc 1.25g IV x1 ED then 750mg  IV q24 Zosyn 3.375g IV q8 F/u with trough if needed

## 2014-08-10 NOTE — ED Notes (Signed)
Pt has just finished antibiotics for an UTI

## 2014-08-10 NOTE — ED Notes (Signed)
Pt here from [facility] with c/o aloc. Pt was seen by family this morning by family at 67 am and they stated that pt is more lethargic than usual , pt has had 4 strokes in the past

## 2014-08-10 NOTE — ED Provider Notes (Signed)
  Physical Exam  BP 118/45  Pulse 61  Temp(Src) 99.1 F (37.3 C) (Rectal)  Resp 18  Ht 5' 8.9" (1.75 m)  Wt 126 lb 15.8 oz (57.6 kg)  BMI 18.81 kg/m2  SpO2 100%  Physical Exam  ED Course  Procedures  MDM Patient with altered mental status. He does have pneumonia on x-ray. This is a likely cause. Will admit to internal medicine      Jasper Riling. Alvino Chapel, MD 08/10/14 [phone]

## 2014-08-11 LAB — CBC
HCT: 26.8 % — ABNORMAL LOW (ref 39.0–52.0)
Hemoglobin: 8.7 g/dL — ABNORMAL LOW (ref 13.0–17.0)
MCH: 29.7 pg (ref 26.0–34.0)
MCHC: 32.5 g/dL (ref 30.0–36.0)
MCV: 91.5 fL (ref 78.0–100.0)
Platelets: 278 10*3/uL (ref 150–400)
RBC: 2.93 MIL/uL — ABNORMAL LOW (ref 4.22–5.81)
RDW: 14.6 % (ref 11.5–15.5)
WBC: 10.2 10*3/uL (ref 4.0–10.5)

## 2014-08-11 LAB — BASIC METABOLIC PANEL
ANION GAP: 7 (ref 5–15)
BUN: 33 mg/dL — ABNORMAL HIGH (ref 6–23)
CALCIUM: 8.7 mg/dL (ref 8.4–10.5)
CO2: 29 meq/L (ref 19–32)
Chloride: 105 mEq/L (ref 96–112)
Creatinine, Ser: 1.14 mg/dL (ref 0.50–1.35)
GFR calc Af Amer: 66 mL/min — ABNORMAL LOW (ref >90)
GFR, EST NON AFRICAN AMERICAN: 57 mL/min — AB (ref >90)
Glucose, Bld: 112 mg/dL — ABNORMAL HIGH (ref 70–99)
Potassium: 4.7 mEq/L (ref 3.7–5.3)
SODIUM: 141 meq/L (ref 137–147)

## 2014-08-11 MED ORDER — SODIUM CHLORIDE 0.9 % IV BOLUS (SEPSIS)
250.0000 mL | Freq: Once | INTRAVENOUS | Status: AC
  Administered 2014-08-11: 250 mL via INTRAVENOUS

## 2014-08-11 NOTE — Significant Event (Signed)
Patient blood pressure low, 80/62, heart rate of 70.  Patient alert, verbally responsive, no c/o pain throughout.  Dr. Wynelle Cleveland informed per page, order received for one time 250cc bolus of normal saline.  Order to be carried out, will recheck patients blood pressure and continue to monitor.  Dirk Dress 08/11/2014 2:27 PM

## 2014-08-11 NOTE — ED Provider Notes (Signed)
I saw and evaluated the patient, reviewed the resident's note and I agree with the findings and plan.   EKG Interpretation   Date/Time:  Saturday August 10 2014 17:07:11 EDT Ventricular Rate:  58 PR Interval:    QRS Duration: 84 QT Interval:  639 QTC Calculation: 628 R Axis:   87 Text Interpretation:  Atrial fibrillation Borderline right axis deviation  Low voltage, extremity leads Prolonged QT interval Baseline wander in  lead(s) V1 Confirmed by Alvino Chapel  MD, Bran Aldridge [phone]) on 08/10/2014 6:35:27  PM       Jasper Riling. Alvino Chapel, MD 08/11/14 [phone]

## 2014-08-11 NOTE — Progress Notes (Signed)
[facility] Progress Note   [name] HUD:[id number] DOB: [birth year] DOA: 08/10/2014 PCP: Cathlean Cower, MD  Brief narrative: [name] is a 80 y.o. male who has a h/o prior CVA's - the last one a few wks ago left him with complete weakness of his left arm and leg and with dysphagia. He was in a rehab facility for the past couple of weeks and was noted by his wife to be somewhat confused, sleepy and not himself on 8/15. He was sent to the ER for further evaluation and was found to have a UTI and a RML infiltrate. He has not had any fever.    Subjective: Not very verbal. Sleepy but easily awakened. Recognizes his wife at bedside. Does not answer questions.   Assessment/Plan: Principal Problem:   Acute encephalopathy - at this point it appears that he may have underlying infections that may be causing this- also noted is the fact that he is quite dehydration which may be adding to the overall picture  Active Problems:    UTI --  Hematuria--  NEOPLASM, MALIGNANT, BLADDER - cont Vanc/Zosyn and follow cultures    Atrial fibrillation - rate controlled with Diltiazem, Digoxin and Amiodarone - cont Eliquis for stroke prevention - per wife, his last stroke occurred when his Xarelto was on hold and she is requesting that Eliquis not be held for any reason (currently has hematuria)     HCAP (healthcare-associated pneumonia) - noted on CXR-- no symptoms of pneumonia as of yet- - possible aspiration? Was placed on pudding thick nectars after CVA- will cont this    Late effects of CVA (cerebrovascular accident) -left arm 0/5 left leg 3/5 - cont dysphagia diet    HYPERTENSION - cont Cardizem   Code Status: Full code Family Communication: with wife Disposition Plan: return to SNF for rehab in 1-2 days DVT prophylaxis: Eliquis  Consultants: none  Procedures: none  Antibiotics: Anti-infectives   Start     Dose/Rate Route Frequency Ordered Stop   08/11/14 1800  vancomycin  (VANCOCIN) IVPB 750 mg/150 ml premix     750 mg 150 mL/hr over 60 Minutes Intravenous Every 24 hours 08/10/14 2152     08/11/14 0200  piperacillin-tazobactam (ZOSYN) IVPB 3.375 g     3.375 g 12.5 mL/hr over 240 Minutes Intravenous Every 8 hours 08/10/14 2152     08/10/14 1945  vancomycin (VANCOCIN) 1,250 mg in sodium chloride 0.9 % 250 mL IVPB     1,250 mg 166.7 mL/hr over 90 Minutes Intravenous  Once 08/10/14 1931 08/10/14 2125   08/10/14 1815  vancomycin (VANCOCIN) 1,152 mg in sodium chloride 0.9 % 250 mL IVPB  Status:  Discontinued     20 mg/kg  57.6 kg 166.7 mL/hr over 90 Minutes Intravenous  Once 08/10/14 1813 08/10/14 1930   08/10/14 1815  piperacillin-tazobactam (ZOSYN) IVPB 3.375 g     3.375 g 100 mL/hr over 30 Minutes Intravenous  Once 08/10/14 1813 08/10/14 1922         Objective: Filed Weights   08/10/14 1753 08/10/14 2237  Weight: 57.6 kg (126 lb 15.8 oz) 50.576 kg (111 lb 8 oz)   No intake or output data in the 24 hours ending 08/11/14 1345   Vitals Filed Vitals:   08/10/14 2237 08/11/14 0256 08/11/14 0500 08/11/14 0914  BP: 137/72  130/40   Pulse: 84  60   Temp: 99.3 F (37.4 C)  98.6 F (37 C)   TempSrc: Axillary     Resp:  21  21   Height:      Weight: 50.576 kg (111 lb 8 oz)     SpO2: 98% 97% 97% 96%    Exam: General: No acute respiratory distress Lungs: Clear to auscultation bilaterally without wheezes or crackles Cardiovascular: Regular rate and rhythm without murmur gallop or rub normal S1 and S2 Abdomen: Nontender, nondistended, soft, bowel sounds positive, no rebound, no ascites, no appreciable mass Extremities: No significant cyanosis, clubbing, or edema bilateral lower extremities  Data Reviewed: Basic Metabolic Panel:  Recent Labs Lab 08/10/14 1800 08/10/14 1812 08/11/14 0435  NA 141 139 141  K 4.5 4.3 4.7  CL 103 103 105  CO2 28  --  29  GLUCOSE 97 97 112*  BUN 36* 34* 33*  CREATININE 1.08 1.10 1.14  CALCIUM 8.7  --  8.7    Liver Function Tests:  Recent Labs Lab 08/10/14 1800  AST 15  ALT 11  ALKPHOS 99  BILITOT <0.2*  PROT 6.4  ALBUMIN 2.5*   No results found for this basename: LIPASE, AMYLASE,  in the last 168 hours  Recent Labs Lab 08/10/14 1722  AMMONIA 13   CBC:  Recent Labs Lab 08/10/14 1800 08/10/14 1812 08/11/14 0435  WBC 8.2  --  10.2  NEUTROABS 6.7  --   --   HGB 8.7* 10.2* 8.7*  HCT 26.9* 30.0* 26.8*  MCV 88.5  --  91.5  PLT 309  --  278   Cardiac Enzymes:  Recent Labs Lab 08/10/14 1850  TROPONINI <0.30   BNP (last 3 results) No results found for this basename: PROBNP,  in the last 8760 hours CBG:  Recent Labs Lab 08/04/14 2126 08/05/14 0649 08/05/14 1135 08/05/14 1623 08/10/14 1735  GLUCAP 148* 102* 131* 111* 88    Recent Results (from the past 240 hour(s))  CULTURE, BLOOD (ROUTINE X 2)     Status: None   Collection Time    08/02/14  3:40 PM      Result Value Ref Range Status   Specimen Description BLOOD LEFT ARM   Final   Special Requests BOTTLES DRAWN AEROBIC AND ANAEROBIC 5CC   Final   Culture  Setup Time     Final   Value: 08/02/2014 19:59     Performed at [place]     Final   Value: NO GROWTH 5 DAYS     Performed at [facility]   Report Status 08/08/2014 FINAL   Final  CULTURE, BLOOD (ROUTINE X 2)     Status: None   Collection Time    08/02/14  3:50 PM      Result Value Ref Range Status   Specimen Description BLOOD LEFT HAND   Final   Special Requests BOTTLES DRAWN AEROBIC AND ANAEROBIC 5CC   Final   Culture  Setup Time     Final   Value: 08/02/2014 19:58     Performed at [place]     Final   Value: NO GROWTH 5 DAYS     Performed at [facility]   Report Status 08/08/2014 FINAL   Final  URINE CULTURE     Status: None   Collection Time    08/02/14  5:32 PM      Result Value Ref Range Status   Specimen Description URINE, CATHETERIZED   Final   Special Requests Normal    Final   Culture  Setup Time  Final   Value: 08/03/2014 00:22     Performed at [facility]     Final   Value: >=100,000 COLONIES/ML     Performed at [facility]   Culture     Final   Value: STAPHYLOCOCCUS SPECIES (COAGULASE NEGATIVE)     Note: RIFAMPIN AND GENTAMICIN SHOULD NOT BE USED AS SINGLE DRUGS FOR TREATMENT OF STAPH INFECTIONS.     ENTEROCOCCUS SPECIES     Performed at [facility]   Report Status 08/06/2014 FINAL   Final   Organism ID, Bacteria STAPHYLOCOCCUS SPECIES (COAGULASE NEGATIVE)   Final   Organism ID, Bacteria ENTEROCOCCUS SPECIES   Final     Studies:  Recent x-ray studies have been reviewed in detail by the Attending Physician  Scheduled Meds:  Scheduled Meds: . albuterol  2.5 mg Nebulization Q6H  . amiodarone  200 mg Oral BID  . apixaban  2.5 mg Oral BID  . atorvastatin  40 mg Oral q1800  . cholecalciferol  2,000 Units Oral Daily  . clotrimazole   Topical BID  . digoxin  0.125 mg Oral Daily  . diltiazem  180 mg Oral Daily  . docusate sodium  200 mg Oral BID  . food thickener  1 Container Oral 3 x daily with food  . piperacillin-tazobactam (ZOSYN)  IV  3.375 g Intravenous Q8H  . sodium chloride  3 mL Intravenous Q12H  . trimethoprim-polymyxin b  1 drop Both Eyes 6 times per day  . vancomycin  750 mg Intravenous Q24H   Continuous Infusions: . sodium chloride 75 mL/hr at 08/11/14 0630    Time spent on care of this patient: 35 min   [place], MD 08/11/2014, 1:45 PM  LOS: 1 day   [facility] Office  [phone] Pager - Text Page per [email or web address]  If 7PM-7AM, please contact night-coverage [email or web address]

## 2014-08-12 LAB — URINE CULTURE
COLONY COUNT: NO GROWTH
Culture: NO GROWTH

## 2014-08-12 MED ORDER — CETYLPYRIDINIUM CHLORIDE 0.05 % MT LIQD
7.0000 mL | Freq: Two times a day (BID) | OROMUCOSAL | Status: DC
  Administered 2014-08-12 – 2014-08-16 (×6): 7 mL via OROMUCOSAL

## 2014-08-12 MED ORDER — CHLORHEXIDINE GLUCONATE 0.12 % MT SOLN
15.0000 mL | Freq: Two times a day (BID) | OROMUCOSAL | Status: DC
  Administered 2014-08-12 – 2014-08-16 (×8): 15 mL via OROMUCOSAL
  Filled 2014-08-12 (×12): qty 15

## 2014-08-12 NOTE — Progress Notes (Signed)
Clinical Social Work Department BRIEF PSYCHOSOCIAL ASSESSMENT 08/12/2014  Patient:  [name], [name]     Account Number:  [id number]     Admit date:  08/10/2014  Clinical Social Worker:  Adair Laundry  Date/Time:  08/12/2014 02:00 PM  Referred by:  Physician  Date Referred:  08/12/2014 Referred for  SNF Placement   Other Referral:   Interview type:  Family Other interview type:   Spoke with pt wife at bedside    PSYCHOSOCIAL DATA Living Status:  FACILITY Admitted from facility:  [facility] Level of care:  [place] Primary support name:  [name] Primary support relationship to patient:  SPOUSE Degree of support available:   Pt has strong support    CURRENT CONCERNS Current Concerns  Post-Acute Placement   Other Concerns:    SOCIAL WORK ASSESSMENT / PLAN CSW made aware that pt was admitted from facility. CSW spoke with pt wife at bedside who confirmed pt was admitted from [facility]. Pt wife informed CSW she would like to have pt dc back but she has not held pt bed at facility. CSW informed that CSW would contact facility and notify of potential dc tomorrow as indicated by MD and confirm if they can accept pt back. Pt wife thankful for CSW. Pt wife expressed difficulty of current situation. Pt wife understanding that pt is confused but expressed that it is difficult when he becomes angry with her because he does not understand he cannot do certain things. CSW offered support and understaning.    CSW called facility and spoke with Florentina Jenny who confirmed pt can return when medically stable.   Assessment/plan status:  Psychosocial Support/Ongoing Assessment of Needs Other assessment/ plan:   Information/referral to community resources:   None needed    PATIENT'S/FAMILY'S RESPONSE TO PLAN OF CARE: Pt wife would like pt to return to [facility] [place]

## 2014-08-12 NOTE — Progress Notes (Signed)
[facility] Progress Note   [name] IDP:[id number] DOB: 19-Nov-1927 DOA: 08/10/2014 PCP: Cathlean Cower, MD  Brief narrative: [name] is a 80 y.o. male who has a h/o prior CVA's - the last one a few wks ago left him with complete weakness of his left arm and leg and with dysphagia. He was in a rehab facility for the past couple of weeks and was noted by his wife to be somewhat confused, sleepy and not himself on 8/15. He was sent to the ER for further evaluation and was found to have a UTI and a RML infiltrate. He has not had any fever.    Subjective: Quite alert today and being fed breakfast without any noted difficulty. He is aware of where he is. He has no complaints of cough or dyspnea.   Assessment/Plan: Principal Problem:   Acute encephalopathy - at this point it appears that he may have underlying infections that may be causing this- also noted is the fact that he is quite dehydration which may be adding to the overall picture  Active Problems:    UTI --  Hematuria--  NEOPLASM, MALIGNANT, BLADDER - no growth on culture therefore may have colonization - cont Vanc/Zosyn    HCAP (healthcare-associated pneumonia) - noted on CXR-- no symptoms of pneumonia as of yet- - possible aspiration? Was placed on pudding thick nectars after CVA- will cont this - will switch to Augmentin and Doxy for 7-10 days for treatment of Hcap    Atrial fibrillation - rate controlled with Diltiazem, Digoxin and Amiodarone - cont Eliquis for stroke prevention - per wife, his last stroke occurred when his Xarelto was on hold and she is requesting that Eliquis not be held for any reason (currently has hematuria)    Late effects of CVA (cerebrovascular accident) -left arm 0/5 left leg 3/5 - cont dysphagia diet    HYPERTENSION - cont Cardizem   Code Status: Full code Family Communication: with wife Disposition Plan: return to SNF for rehab in 1-2 days DVT prophylaxis:  Eliquis  Consultants: none  Procedures: none  Antibiotics: Anti-infectives   Start     Dose/Rate Route Frequency Ordered Stop   08/11/14 1800  vancomycin (VANCOCIN) IVPB 750 mg/150 ml premix     750 mg 150 mL/hr over 60 Minutes Intravenous Every 24 hours 08/10/14 2152     08/11/14 0200  piperacillin-tazobactam (ZOSYN) IVPB 3.375 g     3.375 g 12.5 mL/hr over 240 Minutes Intravenous Every 8 hours 08/10/14 2152     08/10/14 1945  vancomycin (VANCOCIN) 1,250 mg in sodium chloride 0.9 % 250 mL IVPB     1,250 mg 166.7 mL/hr over 90 Minutes Intravenous  Once 08/10/14 1931 08/10/14 2125   08/10/14 1815  vancomycin (VANCOCIN) 1,152 mg in sodium chloride 0.9 % 250 mL IVPB  Status:  Discontinued     20 mg/kg  57.6 kg 166.7 mL/hr over 90 Minutes Intravenous  Once 08/10/14 1813 08/10/14 1930   08/10/14 1815  piperacillin-tazobactam (ZOSYN) IVPB 3.375 g     3.375 g 100 mL/hr over 30 Minutes Intravenous  Once 08/10/14 1813 08/10/14 1922         Objective: Filed Weights   08/10/14 1753 08/10/14 2237 08/12/14 0500  Weight: 57.6 kg (126 lb 15.8 oz) 50.576 kg (111 lb 8 oz) 51.075 kg (112 lb 9.6 oz)    Intake/Output Summary (Last 24 hours) at 08/12/14 0852 Last data filed at 08/12/14 0849  Gross per 24 hour  Intake  590 ml  Output      0 ml  Net    590 ml     Vitals Filed Vitals:   08/11/14 1708 08/11/14 1938 08/11/14 2100 08/12/14 0500  BP: 133/67  116/59 143/68  Pulse: 66  82 62  Temp:   98.2 F (36.8 C) 98.8 F (37.1 C)  TempSrc:      Resp:   16 16  Height:      Weight:    51.075 kg (112 lb 9.6 oz)  SpO2:  97% 96% 96%    Exam: General: No acute respiratory distress Lungs: Clear to auscultation bilaterally without wheezes or crackles Cardiovascular: Regular rate and rhythm without murmur gallop or rub normal S1 and S2 Abdomen: Nontender, nondistended, soft, bowel sounds positive, no rebound, no ascites, no appreciable mass Extremities: No significant cyanosis,  clubbing, or edema bilateral lower extremities  Data Reviewed: Basic Metabolic Panel:  Recent Labs Lab 08/10/14 1800 08/10/14 1812 08/11/14 0435  NA 141 139 141  K 4.5 4.3 4.7  CL 103 103 105  CO2 28  --  29  GLUCOSE 97 97 112*  BUN 36* 34* 33*  CREATININE 1.08 1.10 1.14  CALCIUM 8.7  --  8.7   Liver Function Tests:  Recent Labs Lab 08/10/14 1800  AST 15  ALT 11  ALKPHOS 99  BILITOT <0.2*  PROT 6.4  ALBUMIN 2.5*   No results found for this basename: LIPASE, AMYLASE,  in the last 168 hours  Recent Labs Lab 08/10/14 1722  AMMONIA 13   CBC:  Recent Labs Lab 08/10/14 1800 08/10/14 1812 08/11/14 0435  WBC 8.2  --  10.2  NEUTROABS 6.7  --   --   HGB 8.7* 10.2* 8.7*  HCT 26.9* 30.0* 26.8*  MCV 88.5  --  91.5  PLT 309  --  278   Cardiac Enzymes:  Recent Labs Lab 08/10/14 1850  TROPONINI <0.30   BNP (last 3 results) No results found for this basename: PROBNP,  in the last 8760 hours CBG:  Recent Labs Lab 08/05/14 1135 08/05/14 1623 08/10/14 1735  GLUCAP 131* 111* 88    Recent Results (from the past 240 hour(s))  CULTURE, BLOOD (ROUTINE X 2)     Status: None   Collection Time    08/02/14  3:40 PM      Result Value Ref Range Status   Specimen Description BLOOD LEFT ARM   Final   Special Requests BOTTLES DRAWN AEROBIC AND ANAEROBIC 5CC   Final   Culture  Setup Time     Final   Value: 08/02/2014 19:59     Performed at [facility]   Culture     Final   Value: NO GROWTH 5 DAYS     Performed at [facility]   Report Status 08/08/2014 FINAL   Final  CULTURE, BLOOD (ROUTINE X 2)     Status: None   Collection Time    08/02/14  3:50 PM      Result Value Ref Range Status   Specimen Description BLOOD LEFT HAND   Final   Special Requests BOTTLES DRAWN AEROBIC AND ANAEROBIC 5CC   Final   Culture  Setup Time     Final   Value: 08/02/2014 19:58     Performed at [facility]   Culture     Final   Value: NO GROWTH 5 DAYS      Performed at [facility]   Report Status  08/08/2014 FINAL   Final  URINE CULTURE     Status: None   Collection Time    08/02/14  5:32 PM      Result Value Ref Range Status   Specimen Description URINE, CATHETERIZED   Final   Special Requests Normal   Final   Culture  Setup Time     Final   Value: 08/03/2014 00:22     Performed at [facility]     Final   Value: >=100,000 COLONIES/ML     Performed at [facility]   Culture     Final   Value: STAPHYLOCOCCUS SPECIES (COAGULASE NEGATIVE)     Note: RIFAMPIN AND GENTAMICIN SHOULD NOT BE USED AS SINGLE DRUGS FOR TREATMENT OF STAPH INFECTIONS.     ENTEROCOCCUS SPECIES     Performed at [facility]   Report Status 08/06/2014 FINAL   Final   Organism ID, Bacteria STAPHYLOCOCCUS SPECIES (COAGULASE NEGATIVE)   Final   Organism ID, Bacteria ENTEROCOCCUS SPECIES   Final  URINE CULTURE     Status: None   Collection Time    08/10/14  5:57 PM      Result Value Ref Range Status   Specimen Description URINE, CATHETERIZED   Final   Special Requests NONE   Final   Culture  Setup Time     Final   Value: 08/11/2014 03:28     Performed at [facility]     Final   Value: NO GROWTH     Performed at [facility]   Culture     Final   Value: NO GROWTH     Performed at [facility]   Report Status 08/12/2014 FINAL   Final     Studies:  Recent x-ray studies have been reviewed in detail by the Attending Physician  Scheduled Meds:  Scheduled Meds: . albuterol  2.5 mg Nebulization Q6H  . amiodarone  200 mg Oral BID  . antiseptic oral rinse  7 mL Mouth Rinse q12n4p  . apixaban  2.5 mg Oral BID  . atorvastatin  40 mg Oral q1800  . chlorhexidine  15 mL Mouth Rinse BID  . cholecalciferol  2,000 Units Oral Daily  . clotrimazole   Topical BID  . digoxin  0.125 mg Oral Daily  . diltiazem  180 mg Oral Daily  . docusate sodium  200 mg Oral BID  .  piperacillin-tazobactam (ZOSYN)  IV  3.375 g Intravenous Q8H  . sodium chloride  3 mL Intravenous Q12H  . trimethoprim-polymyxin b  1 drop Both Eyes 6 times per day  . vancomycin  750 mg Intravenous Q24H   Continuous Infusions: . sodium chloride 100 mL/hr at 08/11/14 1432    Time spent on care of this patient: 35 min   [place], MD 08/12/2014, 8:52 AM  LOS: 2 days   [facility] Office  [phone] Pager - Text Page per [email or web address]  If 7PM-7AM, please contact night-coverage [email or web address]

## 2014-08-12 NOTE — Progress Notes (Signed)
UR Completed Jasia Hiltunen Graves-Bigelow, RN,BSN [phone]  

## 2014-08-12 NOTE — Care Management Note (Addendum)
    Page 1 of 1   08/15/2014     10:18:28 AM CARE MANAGEMENT NOTE 08/15/2014  Patient:  [name], [name]   Account Number:  [id number]  Date Initiated:  08/12/2014  Documentation initiated by:  GRAVES-BIGELOW,Krysti Hickling  Subjective/Objective Assessment:   Pt admitted from [facility] for HCAP. Initiated on IV abx, Vanc and zosyn.     Action/Plan:   CSW to assist with disposition needs.   Anticipated DC Date:  08/12/2014   Anticipated DC Plan:  SKILLED NURSING FACILITY  In-house referral  Clinical Social Worker      DC Planning Services  CM consult      Choice offered to / List presented to:             Status of service:  Completed, signed off Medicare Important Message given?  YES (If response is "NO", the following Medicare IM given date fields will be blank) Date Medicare IM given:  08/12/2014 Medicare IM given by:  GRAVES-BIGELOW,Rhett Mutschler Date Additional Medicare IM given:  08/15/2014 Additional Medicare IM given by:  Kealan Buchan GRAVES-BIGELOW  Discharge Disposition:  [place]  Per UR Regulation:  Reviewed for med. necessity/level of care/duration of stay  If discussed at [place] of Stay Meetings, dates discussed:   08/15/2014    Comments:  08-14-14 [place], RN, BSN [phone] Pt continues on IV zosyn. Plan to return to [facility] once medically stable for d/c.

## 2014-08-12 NOTE — Progress Notes (Signed)
INITIAL NUTRITION ASSESSMENT  Patient meets the criteria for severe MALNUTRITION in the context of chronic illness with 14% weight loss in 6 months and subcutaneous fat and muscle depletion.    DOCUMENTATION CODES Per approved criteria  -Severe malnutrition in the context of chronic illness   INTERVENTION: -Magic Cups on all trays -Continue to encourage adequate intake of meals with food choices per patient preference.  NUTRITION DIAGNOSIS: Malnutrition related to decreased oral intake as evidenced by 14% weight loss in 6 months and fat/muscle depletion.   Goal: Patient will meet >/=90% of estimated nutrition needs  Monitor:  PO intake, weight, labs  Reason for Assessment: Malnutrition screening tool  80 y.o. male  Admitting Dx: Acute encephalopathy  ASSESSMENT: 80 y.o. male with a CVAs with Left Hemiparesis, Dysphagia and Cognitive Deficits, Chronic Atrial fibrillation on Eliquis Rx, Bladder CA , COPD, HTH, S/P Splenectomy currently in [facility] place for Rehab Rx who was sent to the ED because of increased lethargic.    Patient with a recent admission earlier this month, RD spoke with wife. Since then, he has been eating pretty well, with about 75% intake of meals. Wife is ordering foods per patient preference. He has lost about 14% of his usual weight in 6 months. He does not like Ensure pudding, but does eat the [employer].   Nutrition Focused Physical Exam:  Subcutaneous Fat:  Orbital Region: severe depletion Upper Arm Region: severe depletion Thoracic and Lumbar Region: severe depletion  Muscle:  Temple Region: severe depletion Clavicle Bone Region: severe depletion Clavicle and Acromion Bone Region: severe depletion Scapular Bone Region: severe depletion Dorsal Hand: severe depletion Patellar Region: severe depletion Anterior Thigh Region: severe depletion Posterior Calf Region: severe depletion  Edema: not present  Height: Ht Readings from Last 1 Encounters:   08/10/14 5' 8.9" (1.75 m)    Weight: Wt Readings from Last 1 Encounters:  08/12/14 112 lb 9.6 oz (51.075 kg)    Ideal Body Weight: 160 pounds  % Ideal Body Weight: 70%  Wt Readings from Last 10 Encounters:  08/12/14 112 lb 9.6 oz (51.075 kg)  08/06/14 127 lb (57.607 kg)  08/05/14 127 lb 6.8 oz (57.8 kg)  07/29/14 117 lb (53.071 kg)  07/20/14 117 lb 4.6 oz (53.2 kg)  05/13/14 120 lb 9.6 oz (54.704 kg)  05/02/14 120 lb (54.432 kg)  03/28/14 120 lb (54.432 kg)  03/14/14 121 lb (54.885 kg)  03/01/14 122 lb (55.339 kg)    Usual Body Weight: 130 pounds  % Usual Body Weight: 86%  BMI:  Body mass index is 16.68 kg/(m^2). Patient is underweight.   Estimated Nutritional Needs: Kcal: 1600-1800  Protein: 75-85 grams  Fluid: 1.6-1.8 L/day  Skin: intact  Diet Order: Dysphagia 1, honey-thick  EDUCATION NEEDS: -No education needs identified at this time   Intake/Output Summary (Last 24 hours) at 08/12/14 1504 Last data filed at 08/12/14 0849  Gross per 24 hour  Intake    490 ml  Output      0 ml  Net    490 ml    Last BM: PTA   Labs:   Recent Labs Lab 08/10/14 1800 08/10/14 1812 08/11/14 0435  NA 141 139 141  K 4.5 4.3 4.7  CL 103 103 105  CO2 28  --  29  BUN 36* 34* 33*  CREATININE 1.08 1.10 1.14  CALCIUM 8.7  --  8.7  GLUCOSE 97 97 112*    CBG (last 3)   Recent Labs  08/10/14 1735  GLUCAP 88    Scheduled Meds: . albuterol  2.5 mg Nebulization Q6H  . amiodarone  200 mg Oral BID  . antiseptic oral rinse  7 mL Mouth Rinse q12n4p  . apixaban  2.5 mg Oral BID  . atorvastatin  40 mg Oral q1800  . chlorhexidine  15 mL Mouth Rinse BID  . cholecalciferol  2,000 Units Oral Daily  . clotrimazole   Topical BID  . digoxin  0.125 mg Oral Daily  . diltiazem  180 mg Oral Daily  . docusate sodium  200 mg Oral BID  . sodium chloride  3 mL Intravenous Q12H  . trimethoprim-polymyxin b  1 drop Both Eyes 6 times per day    Continuous Infusions: . sodium  chloride 100 mL/hr at 08/11/14 1432    Past Medical History  Diagnosis Date  . Bladder cancer     a. Dr. Lawerance Bach - [facility]. Multiple surgeries, chemo trial.  . Hyperlipidemia   . Anemia 04/10/2009  . Immune thrombocytopenic purpura     a. s/p splenectomy.  Marland Kitchen Anxiety   . Hypertension   . Permanent atrial fibrillation     a. Historically difficult to control rates  . COPD   . TIA (transient ischemic attack)     a. H/o TIA 2007.  . Stroke     a. H/o remote lacunar infarcts, at least 3 of them. b. Right lenticular nucleus and corona radiata infarct, cardioembolic 07/2955. c. Stroke 11/2013, new infarct 2 weeks later 12/2013.  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 Gross hematuria     a. H/o bladder CA with intermittent gross hematuria.  Marland Kitchen Dysphagia   . Anemia     Past Surgical History  Procedure Laterality Date  . Splenectomy    . Bilateral vats ablation    . Facial cancer      facial skin cancer    Larey Seat, RD, LDN Pager #: [phone] After-Hours Pager #: [phone]

## 2014-08-12 NOTE — Clinical Documentation Improvement (Signed)
  Patient admitted with Pneumonia and UTI.  Currently on Vanc and Zosyn.  History of CVA and dysphagia.  Patient receiving a dysphagia diet.  CXR this admission shows, "Medial right lung base airspace disease, suspicious for infection or aspiration".  Possible Clinical Conditions:   - Possible,Probable, Likely or Suspected Aspiration Pneumonia   - Other Condition   - Unable to Clinically Determine  Thank You,  Erling Conte ,RN Clinical Documentation Specialist:  [phone] [facility] [employer]

## 2014-08-13 ENCOUNTER — Encounter (HOSPITAL_COMMUNITY): Payer: Self-pay | Admitting: General Practice

## 2014-08-13 LAB — BASIC METABOLIC PANEL
Anion gap: 11 (ref 5–15)
BUN: 28 mg/dL — AB (ref 6–23)
CHLORIDE: 107 meq/L (ref 96–112)
CO2: 25 mEq/L (ref 19–32)
Calcium: 8.6 mg/dL (ref 8.4–10.5)
Creatinine, Ser: 0.9 mg/dL (ref 0.50–1.35)
GFR calc Af Amer: 88 mL/min — ABNORMAL LOW (ref >90)
GFR calc non Af Amer: 76 mL/min — ABNORMAL LOW (ref >90)
GLUCOSE: 133 mg/dL — AB (ref 70–99)
POTASSIUM: 4 meq/L (ref 3.7–5.3)
Sodium: 143 mEq/L (ref 137–147)

## 2014-08-13 LAB — CBC
HEMATOCRIT: 25.8 % — AB (ref 39.0–52.0)
HEMOGLOBIN: 8.5 g/dL — AB (ref 13.0–17.0)
MCH: 29.4 pg (ref 26.0–34.0)
MCHC: 32.9 g/dL (ref 30.0–36.0)
MCV: 89.3 fL (ref 78.0–100.0)
Platelets: 274 10*3/uL (ref 150–400)
RBC: 2.89 MIL/uL — AB (ref 4.22–5.81)
RDW: 14.6 % (ref 11.5–15.5)
WBC: 11.7 10*3/uL — AB (ref 4.0–10.5)

## 2014-08-13 MED ORDER — AMOXICILLIN-POT CLAVULANATE 600-42.9 MG/5ML PO SUSR
875.0000 mg | Freq: Two times a day (BID) | ORAL | Status: DC
  Filled 2014-08-13 (×2): qty 7.3

## 2014-08-13 MED ORDER — ALBUTEROL SULFATE (2.5 MG/3ML) 0.083% IN NEBU: 2.5000 mg | INHALATION_SOLUTION | Freq: Four times a day (QID) | RESPIRATORY_TRACT | Status: AC

## 2014-08-13 MED ORDER — PIPERACILLIN-TAZOBACTAM 3.375 G IVPB
3.3750 g | Freq: Three times a day (TID) | INTRAVENOUS | Status: DC
  Administered 2014-08-13 – 2014-08-15 (×6): 3.375 g via INTRAVENOUS
  Filled 2014-08-13 (×8): qty 50

## 2014-08-13 MED ORDER — DOXYCYCLINE HYCLATE 100 MG PO TABS
100.0000 mg | ORAL_TABLET | Freq: Two times a day (BID) | ORAL | Status: DC
  Administered 2014-08-13 – 2014-08-16 (×7): 100 mg via ORAL
  Filled 2014-08-13 (×10): qty 1

## 2014-08-13 NOTE — Discharge Summary (Signed)
Physician Discharge Summary  Naszir Cott OVF:[id number] DOB: 1927-06-29 DOA: 08/10/2014  PCP: Cathlean Cower, MD  Admit date: 08/10/2014 Discharge date: 08/13/2014  Time spent: >45 minutes  THIS IS A PRELIMINARY D/C SUMMARY AND WILL BE UPDATED ON THE DAY OF DISCHARGE.   Recommendations for Outpatient Follow-up:  1. F/u on WBC count and fever  Discharge Diagnoses:  Principal Problem:   Acute encephalopathy Active Problems:   HCAP (healthcare-associated pneumonia)   NEOPLASM, MALIGNANT, BLADDER   HYPERLIPIDEMIA   ANEMIA-NOS   HYPERTENSION   COPD   UTI (lower urinary tract infection)   Atrial fibrillation   Hematuria   Dyslipidemia   Late effects of CVA (cerebrovascular accident)   Discharge Condition: stable  Diet recommendation: heart healthy  Filed Weights   08/10/14 2237 08/12/14 0500 08/13/14 0400  Weight: 50.576 kg (111 lb 8 oz) 51.075 kg (112 lb 9.6 oz) 51 kg (112 lb 7 oz)    History of present illness:  Quadry Kampa is a 80 y.o. male who has a h/o prior CVA's - the last one a few wks ago left him with complete weakness of his left arm and leg and with dysphagia. He was in a rehab facility for the past couple of weeks and was noted by his wife to be somewhat confused, sleepy and not himself on 8/15. He was sent to the ER for further evaluation and was found to have a UTI and a RML infiltrate. He has not had any fever.   Hospital Course:  Principal Problem:  Acute encephalopathy  - at this point it appears that he may have underlying infections that may be causing this- also noted is the fact that he is quite dehydration which may be adding to the overall picture  - confusion resolved by the following morning after admission and likely was due to underlying infections as mentioned  Active Problems:  UTI -- Hematuria-- NEOPLASM, MALIGNANT, BLADDER  - no growth on culture therefore may have been colonization   HCAP - fever - noted on CXR to have RML pneumonia although  he had no leukocytosis or cough prior to admission- he did have a fever of 101.4 -- developed a mild productive cough AFTER being started on antibiotics.  - possible aspiration? Was placed on pudding thick nectars after CVA- will cont this - no obvious signs of aspiration noted during hospital stay - have switched to Augmentin and Doxy for 7-10 days for treatment of Hcap   Atrial fibrillation  - rate controlled with Diltiazem, Digoxin and Amiodarone  - cont Eliquis for stroke prevention  - per wife, his last stroke occurred when his Xarelto was on hold and she is requesting that Eliquis not be held for any reason (currently has hematuria)   Late effects of CVA (cerebrovascular accident)  -left arm 0/5 left leg 3/5  - cont dysphagia diet  - cont physical therapy at skilled rehab facility  HYPERTENSION  - cont Cardizem    Procedures:  none  Consultations:  none  Discharge Exam: Filed Vitals:   08/13/14 0400  BP: 111/69  Pulse: 75  Temp: 99.1 F (37.3 C)  Resp: 18    General: AAo x3, no distress Cardiovascular: RRR, no murmurs Respiratory: CTA b/l  Discharge Instructions You were cared for by a hospitalist during your hospital stay. If you have any questions about your discharge medications or the care you received while you were in the hospital after you are discharged, you can call the unit and asked  to speak with the hospitalist on call if the hospitalist that took care of you is not available. Once you are discharged, your primary care physician will handle any further medical issues. Please note that NO REFILLS for any discharge medications will be authorized once you are discharged, as it is imperative that you return to your primary care physician (or establish a relationship with a primary care physician if you do not have one) for your aftercare needs so that they can reassess your need for medications and monitor your lab values.     Medication List    ASK your  doctor about these medications       amiodarone 200 MG tablet  Commonly known as:  PACERONE  Take 200 mg by mouth 2 (two) times daily. Present dose for 2 weeks then 200 mg daily after 2 weeks. Unknown start date     apixaban 2.5 MG Tabs tablet  Commonly known as:  ELIQUIS  Take 2.5 mg by mouth 2 (two) times daily.     atorvastatin 40 MG tablet  Commonly known as:  LIPITOR  Take 1 tablet (40 mg total) by mouth daily at 6 PM.     bacitracin-polymyxin b ointment  Commonly known as:  POLYSPORIN  Apply 1 application topically every 12 (twelve) hours.     bacitracin-polymyxin b ophthalmic ointment  Commonly known as:  POLYSPORIN  Place into both eyes every 4 (four) hours. apply to eye every 12 hours while awake     ciprofloxacin 500 MG tablet  Commonly known as:  CIPRO  Take 500 mg by mouth 2 (two) times daily.     digoxin 0.125 MG tablet  Commonly known as:  LANOXIN  Take 1 tablet (0.125 mg total) by mouth daily.     diltiazem 180 MG 24 hr capsule  Commonly known as:  CARDIZEM CD  Take 1 capsule (180 mg total) by mouth daily.     DSS 100 MG Caps  Take 200 mg by mouth 2 (two) times daily.     food thickener Powd  Commonly known as:  THICK IT  Take 1 Container by mouth as needed. HONEY THICK     nitrofurantoin (macrocrystal-monohydrate) 100 MG capsule  Commonly known as:  MACROBID  Take 100 mg by mouth every 12 (twelve) hours.     Vitamin D 2000 UNITS tablet  Take 2,000 Units by mouth daily.       Allergies  Allergen Reactions  . Azithromycin Other (See Comments)    Altered mental status  . Diazepam Other (See Comments)    REACTION: agitation  . Ezetimibe-Simvastatin Other (See Comments)    Unknown, wife thinks it's muscle pain  . Morphine Other (See Comments)    Hallucinations.  . Prednisone Other (See Comments)    Dizziness  . Sulfonamide Derivatives Hives      The results of significant diagnostics from this hospitalization (including imaging,  microbiology, ancillary and laboratory) are listed below for reference.    Significant Diagnostic Studies: Dg Chest 2 View  07/30/2014   CLINICAL DATA:  Acute stroke.  EXAM: CHEST  2 VIEW  COMPARISON:  07/20/2014 and 01/06/2014  FINDINGS: There is severe emphysema. There is benign calcification in the right midzone, stable. Heart size and vascularity are normal. No acute osseous abnormalities. Diffuse osteopenia. Chronic by a lateral apical pleural thickening, left greater than right.  IMPRESSION: No acute abnormalities.  Severe emphysema.   Electronically Signed   By: Vanita Ingles.D.  On: 07/30/2014 17:21   Ct Head Wo Contrast  08/10/2014   CLINICAL DATA:  Altered consciousness.  Lethargy.  EXAM: CT HEAD WITHOUT CONTRAST  TECHNIQUE: Contiguous axial images were obtained from the base of the skull through the vertex without intravenous contrast.  COMPARISON:  07/30/2014 and MRI of 07/30/2014  FINDINGS: Sinuses/Soft tissues: Cerumen in the right external ear canal. Clear paranasal sinuses and mastoid air cells.  Intracranial: Advanced cerebral atrophy. Moderate to marked low density in the periventricular white matter likely related to small vessel disease. Hypoattenuation in the right corona radiata on image 17 is like due to evolving subacute to chronic infarct. Scattered remote lacunar infarcts in the basal ganglia. Remote left occipital lobe cortical based infarct.  No hemorrhage, hydrocephalus, intra-axial, or extra-axial fluid collection.  IMPRESSION: 1.  No acute intracranial abnormality. 2.  Cerebral atrophy and small vessel ischemic change. 3. Subacute to chronic right corona radiata infarct, as detailed on prior MRI.   Electronically Signed   By: Abigail Miyamoto M.D.   On: 08/10/2014 18:26   Ct Head Wo Contrast  07/30/2014   CLINICAL DATA:  Slurred speech  EXAM: CT HEAD WITHOUT CONTRAST  TECHNIQUE: Contiguous axial images were obtained from the base of the skull through the vertex without  intravenous contrast.  COMPARISON:  07/25/2014  FINDINGS: The bony calvarium is intact. Atrophic changes are noted. A stable left occipital infarct is noted when compared with the prior exam. Scattered lacunar infarcts are noted within the deep white matter bilaterally and also stable. Chronic white matter ischemic changes noted. No acute hemorrhage, acute infarction or space-occupying mass lesion is identified.  IMPRESSION: Chronic changes stable from recent MRI examination. No acute abnormality is noted.   Electronically Signed   By: Inez Catalina M.D.   On: 07/30/2014 10:00   Ct Head Wo Contrast  07/20/2014   CLINICAL DATA:  Generalized weakness and mental status change.  EXAM: CT HEAD WITHOUT CONTRAST  TECHNIQUE: Contiguous axial images were obtained from the base of the skull through the vertex without intravenous contrast.  COMPARISON:  01/05/2014.  FINDINGS: Stable age related cerebral atrophy, ventriculomegaly, periventricular white matter disease and remote lacunar-type basal ganglia infarcts. Evolutionary change in the left PCA infarct noted on the prior MRI with encephalomalacia. No definite acute infarct and no intracranial hemorrhage, mass lesion or extra-axial fluid collection.  The bony structures are intact. The paranasal sinuses and mastoid air cells are grossly clear. The globes are intact.  IMPRESSION: 1. Stable age related cerebral atrophy, ventriculomegaly and periventricular white matter disease. 2. Remote lacunar-type basal ganglia infarcts an remote left PCA infarct. 3. No acute intracranial findings, mass lesion or skull fracture.   Electronically Signed   By: Kalman Jewels M.D.   On: 07/20/2014 13:23   Mr Brain Wo Contrast  07/30/2014   CLINICAL DATA:  New left-sided weakness. Straight. History of multiple prior strokes. Atrial fibrillation.  EXAM: MRI HEAD WITHOUT CONTRAST  MRA HEAD WITHOUT CONTRAST  TECHNIQUE: Multiplanar, multiecho pulse sequences of the brain and surrounding  structures were obtained without intravenous contrast. Angiographic images of the head were obtained using MRA technique without contrast.  COMPARISON:  Head CT 07/30/2014. Head MRI 07/25/2014. Head MRA 08/17/2012.  FINDINGS: MRI HEAD FINDINGS  Images are mildly degraded by motion.  There is a small, acute infarct centered in the posterior right corona radiata with a small amount of extension into the posterior aspects of the external and internal capsules. T2 hyperintensities in the periventricular white  matter and pons are similar to the prior MRI and nonspecific but compatible with moderate chronic small vessel ischemic disease. A remote left occipital lobe infarct is again seen. Small, remote infarcts are again seen in the bilateral corona radiata, basal ganglia, thalami, and cerebellum. There is moderate cerebral atrophy. There is no evidence of intracranial hemorrhage, mass, midline shift, or extra-axial fluid collection.  Orbits are unremarkable. Paranasal sinuses are clear. There is a small right mastoid effusion. Major intracranial vascular flow voids are preserved.  MRA HEAD FINDINGS  Images are moderately degraded by motion.  Visualized distal vertebral arteries are patent with the left being dominant. PICA origins are patent. SCA origins are patent. Mild irregularity is noted of the basilar artery without significant stenosis. There are patent bilateral posterior communicating arteries, with a fetal type origin of the left PCA. There is no evidence of high-grade proximal PCA stenosis. Moderate bilateral PCA branch vessel irregularity is noted.  Mild ectasia is again noted at the distal cervical left ICA. Internal carotid arteries are patent from skullbase to carotid termini and again demonstrate mild ectasia. Bilateral M1 segments are patent without evidence of high-grade stenosis. MCA bifurcations and proximal M2 segments are poorly evaluated due to motion, particularly on the right. There is diminished  flow related enhancement proximally in the right M2 superior division, suggestive of either severe stenosis or segmental occlusion, with some diminished flow present in more distal right MCA branches compared to the left. Left M2 branches appear grossly patent, although evaluation for stenosis is limited by motion. There is no proximal ACA stenosis. ACA branch vessel irregularity is noted bilaterally.  IMPRESSION: 1. Small, acute infarct primarily involving the right corona radiata. 2. Remote infarcts and moderate chronic small vessel ischemic disease as above. 3. Moderately motion degraded MRA without large vessel occlusion. Proximal right M2 severe stenosis versus segmental occlusion.   Electronically Signed   By: Logan Bores   On: 07/30/2014 17:38   Mr Brain Wo Contrast  07/25/2014   CLINICAL DATA:  Stroke  EXAM: MRI HEAD WITHOUT CONTRAST  TECHNIQUE: Multiplanar, multiecho pulse sequences of the brain and surrounding structures were obtained without intravenous contrast.  COMPARISON:  MRI 01/06/2014  FINDINGS: Generalized atrophy. Multiple areas of chronic infarction including the cerebral white matter bilaterally, and the basal ganglia bilaterally. Chronic infarct left occipital lobe which contains an area of hyperintensity on diffusion-weighted imaging. Favor chronic blood products although small area of acute infarct is possible. Correlate with any acute visual changes. No other areas of acute infarct.  Negative for mass lesion.  No shift of the midline structures.  Paranasal sinuses are clear.  IMPRESSION: Atrophy and moderately severe chronic ischemic change.  Chronic left occipital infarct as noted on the prior study. Small area of diffusion hyperintensity on the posterior aspect of this infarct. Favor chronic blood products versus a small area of acute infarct posterior to the encephalomalacia.   Electronically Signed   By: Franchot Gallo M.D.   On: 07/25/2014 10:09   Dg Chest Portable 1  View  08/10/2014   CLINICAL DATA:  Altered mental status.  Weakness.  EXAM: PORTABLE CHEST - 1 VIEW  COMPARISON:  08/02/2014  FINDINGS: Hyperinflation. Numerous leads and wires project over the chest. Midline trachea. Moderate cardiomegaly with atherosclerosis in the transverse aorta. Trace right pleural fluid or thickening blunts the costophrenic angle. There is also left greater than right pleural parenchymal scarring at the apices.  No congestive failure. Patchy medial right lung base airspace disease.  IMPRESSION: Medial right lung base airspace disease, suspicious for infection or aspiration.  Cardiomegaly and hyperinflation, without congestive failure.  Trace right pleural fluid or thickening.   Electronically Signed   By: Abigail Miyamoto M.D.   On: 08/10/2014 17:55   Dg Chest Port 1 View  08/02/2014   CLINICAL DATA:  Fever  EXAM: PORTABLE CHEST - 1 VIEW  COMPARISON:  07/30/2014  FINDINGS: Diffuse interstitial edema and vascular congestion have developed. Mild cardiomegaly. No pneumothorax. No pleural effusion. Chronic pleural changes.  IMPRESSION: New development of interstitial edema.   Electronically Signed   By: Maryclare Bean M.D.   On: 08/02/2014 16:25   Dg Chest Portable 1 View  07/20/2014   CLINICAL DATA:  Chest pain  EXAM: PORTABLE CHEST - 1 VIEW  COMPARISON:  January 06, 2014  FINDINGS: There is underlying emphysematous change. There are areas of mild scarring bilaterally. There is no frank edema or consolidation. Heart is upper normal in size with pulmonary vascularity reflecting the underlying emphysematous change. There is stable apical pleural thickening bilaterally, slightly more on the left than on the right. No adenopathy. There is atherosclerotic change in the aorta.  IMPRESSION: Underlying emphysema with areas of lung scarring. No edema or consolidation.   Electronically Signed   By: Lowella Grip M.D.   On: 07/20/2014 12:40   Dg Swallowing Func-speech Pathology  07/31/2014   Loletha Grayer, [place]     07/31/2014 12:17 PM Objective Swallowing Evaluation: Modified Barium Swallowing Study   Patient Details  Name: Lenon Kuennen MRN: [id number] Date of Birth: 04/13/1927  Today's Date: 07/31/2014 Time: 0321-2248 SLP Time Calculation (min): 26 min  Past Medical History:  Past Medical History  Diagnosis Date  . Bladder cancer     a. Dr. Lawerance Bach - [facility]. Multiple surgeries, chemo trial.  . Hyperlipidemia   . Anemia 04/10/2009  . Immune thrombocytopenic purpura     a. s/p splenectomy.  Marland Kitchen Anxiety   . Hypertension   . Permanent atrial fibrillation     a. Historically difficult to control rates  . COPD   . TIA (transient ischemic attack)     a. H/o TIA 2007.  . Stroke     a. H/o remote lacunar infarcts, at least 3 of them. b. Right  lenticular nucleus and corona radiata infarct, cardioembolic  01/5002. c. Stroke 11/2013, new infarct 2 weeks later 12/2013.  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 Gross hematuria     a. H/o bladder CA with intermittent gross hematuria.  Marland Kitchen Dysphagia   . Anemia    Past Surgical History:  Past Surgical History  Procedure Laterality Date  . Splenectomy    . Bilateral vats ablation    . Facial cancer      facial skin cancer   HPI:  Bryan Goin is a 80 y.o. male, with H/O multiple bilateral CVAs,  at baseline left side stronger than right, atrial fibrillation on  xaralto, hypertension, COPD, UTI, chronic intermittent hematuria,  who was recently discharged to a nursing home from this facility  after a UTI related admission, Pt was admitted from [facility] 07/30/14  with slurred speech and new onset dense left-sided weakness. MRI  revealed a small, acute infarct primarily involving the right  corona radiata.     Assessment / Plan / Recommendation Clinical Impression  Dysphagia Diagnosis: Moderate oral phase dysphagia;Severe oral  phase dysphagia;Moderate  pharyngeal phase dysphagia;Severe  pharyngeal phase  dysphagia  Clinical Impression:   Pt presents with a moderate-severe  oropharyngeal dysphagia that is sensorimotor based. Pt's oral  phase is characterized by weak and discoordinated manipulation  with lingual pumping and decreased bolus cohesion, leading to  both anterior and posterior loss. Pt exhibited very delayed  mastication with soft solid with residuals remaining in oral  cavity. Unfortunately, all liquid consistencies tested resulted  in silent aspiration or deep penetration to the vocal folds,  which occurred before the swallow with prematurely spilled  material. Solid PO was not observed to enter the airway, although  pharyngeal weakness led to mild-moderate diffuse residue,  particularly at the valleculae. Recommend to initiate Dys 1  textures and pudding thick liquids with pharyngeal strengthening  exercises to maximize strength and efficiency.    Treatment Recommendation  Therapy as outlined in treatment plan below    Diet Recommendation Dysphagia 1 (Puree);Pudding-thick liquid   Liquid Administration via: Spoon Medication Administration: Crushed with puree Supervision: Patient able to self feed;Staff to assist with self  feeding;Full supervision/cueing for compensatory strategies Compensations: Slow rate;Small sips/bites;Check for  pocketing;Check for anterior loss;Multiple dry swallows after  each bite/sip Postural Changes and/or Swallow Maneuvers: Seated upright 90  degrees;Upright 30-60 min after meal    Other  Recommendations Recommended Consults: MBS Oral Care Recommendations: Oral care BID Other Recommendations: Order thickener from pharmacy;Prohibited  food (jello, ice cream, thin soups);Remove water pitcher;Have  oral suction available   Follow Up Recommendations  Inpatient Rehab;24 hour supervision/assistance    Frequency and Duration min 2x/week  2 weeks   Pertinent Vitals/Pain n/a    SLP Swallow Goals     General Date of Onset: 07/30/14 HPI: Huie Ghuman is a 80 y.o. male, with H/O multiple  bilateral  CVAs, at baseline left side stronger than right, atrial  fibrillation on xaralto, hypertension, COPD, UTI, chronic  intermittent hematuria, who was recently discharged to a nursing  home from this facility after a UTI related admission, Pt was  admitted from [facility] 07/30/14 with slurred speech and new onset dense  left-sided weakness. MRI revealed a small, acute infarct  primarily involving the right corona radiata. Type of Study: Modified Barium Swallowing Study Reason for Referral: Objectively evaluate swallowing function Previous Swallow Assessment: most recent MBS 12/2013 recommending  Dys 1 and honey. Pt discharged from that admission on Dys 3 and  nectar with the water protocol. Diet Prior to this Study: NPO Temperature Spikes Noted: No Respiratory Status: Room air History of Recent Intubation: No Behavior/Cognition: Alert;Cooperative;Pleasant mood;Requires  cueing Oral Cavity - Dentition: Adequate natural dentition Oral Motor / Sensory Function: Impaired - see Bedside swallow  eval Self-Feeding Abilities: Needs assist Patient Positioning: Upright in chair Baseline Vocal Quality: Low vocal intensity Volitional Cough: Weak Volitional Swallow: Able to elicit Anatomy: Within functional limits Pharyngeal Secretions: Not observed secondary MBS    Reason for Referral Objectively evaluate swallowing function   Oral Phase Oral Preparation/Oral Phase Oral Phase: Impaired Oral - Honey Oral - Honey Teaspoon: Left anterior bolus loss;Weak lingual  manipulation;Lingual pumping;Reduced posterior propulsion;Delayed  oral transit Oral - Honey Cup: Left anterior bolus loss;Weak lingual  manipulation;Lingual pumping;Reduced posterior propulsion;Delayed  oral transit Oral - Nectar Oral - Nectar Teaspoon: Left anterior bolus loss;Weak lingual  manipulation;Lingual pumping;Reduced posterior propulsion;Delayed  oral transit Oral - Nectar Cup: Left anterior bolus loss;Weak lingual  manipulation;Lingual pumping;Reduced  posterior propulsion;Delayed  oral transit Oral - Solids Oral - Puree: Left anterior bolus loss;Weak lingual  manipulation;Lingual pumping;Reduced posterior propulsion;Delayed  oral transit Oral - Mechanical Soft: Left anterior bolus loss;Weak lingual  manipulation;Lingual pumping;Reduced posterior propulsion;Delayed  oral transit;Impaired mastication;Lingual/palatal residue   Pharyngeal Phase Pharyngeal Phase Pharyngeal Phase: Impaired Pharyngeal - Honey Pharyngeal - Honey Teaspoon: Premature spillage to pyriform  sinuses;Reduced anterior laryngeal mobility;Reduced laryngeal  elevation;Reduced tongue base retraction;Penetration/Aspiration  before swallow;Reduced pharyngeal peristalsis;Pharyngeal residue  - valleculae;Pharyngeal residue - pyriform sinuses;Pharyngeal  residue - posterior pharnyx Penetration/Aspiration details (honey teaspoon): Material enters  airway, passes BELOW cords without attempt by patient to eject  out (silent aspiration) Pharyngeal - Honey Cup: Premature spillage to pyriform  sinuses;Reduced anterior laryngeal mobility;Reduced laryngeal  elevation;Reduced tongue base retraction;Penetration/Aspiration  before swallow;Reduced pharyngeal peristalsis;Pharyngeal residue  - valleculae;Pharyngeal residue - pyriform sinuses;Pharyngeal  residue - posterior pharnyx Penetration/Aspiration details (honey cup): Material enters  airway, passes BELOW cords without attempt by patient to eject  out (silent aspiration) Pharyngeal - Nectar Pharyngeal - Nectar Teaspoon: Premature spillage to pyriform  sinuses;Reduced anterior laryngeal mobility;Reduced laryngeal  elevation;Reduced tongue base retraction;Penetration/Aspiration  before swallow;Reduced pharyngeal peristalsis;Pharyngeal residue  - valleculae;Pharyngeal residue - pyriform sinuses;Pharyngeal  residue - posterior pharnyx Penetration/Aspiration details (nectar teaspoon): Material enters  airway, CONTACTS cords and not ejected out Pharyngeal - Nectar  Cup: Premature spillage to pyriform  sinuses;Reduced anterior laryngeal mobility;Reduced laryngeal  elevation;Reduced tongue base retraction;Penetration/Aspiration  before swallow;Reduced pharyngeal peristalsis;Pharyngeal residue  - valleculae;Pharyngeal residue - pyriform sinuses;Pharyngeal  residue - posterior pharnyx Penetration/Aspiration details (nectar cup): Material enters  airway, passes BELOW cords without attempt by patient to eject  out (silent aspiration) Pharyngeal - Solids Pharyngeal - Puree: Reduced anterior laryngeal mobility;Reduced  laryngeal elevation;Reduced tongue base retraction;Reduced  pharyngeal peristalsis;Pharyngeal residue - valleculae;Pharyngeal  residue - pyriform sinuses;Pharyngeal residue - posterior  pharnyx;Premature spillage to valleculae Penetration/Aspiration details (puree): Material does not enter  airway Pharyngeal - Mechanical Soft: Reduced anterior laryngeal  mobility;Reduced laryngeal elevation;Reduced tongue base  retraction;Reduced pharyngeal peristalsis;Pharyngeal residue -  valleculae;Pharyngeal residue - pyriform sinuses;Pharyngeal  residue - posterior pharnyx;Premature spillage to valleculae Penetration/Aspiration details (mechanical soft): Material does  not enter airway  Cervical Esophageal Phase    GO    Cervical Esophageal Phase Cervical Esophageal Phase: [facility]        Germain Osgood, M.A. CCC-SLP [phone]  Germain Osgood 07/31/2014, 12:13 PM    Mr Jodene Nam Head/brain Wo Cm  07/30/2014   CLINICAL DATA:  New left-sided weakness. Straight. History of multiple prior strokes. Atrial fibrillation.  EXAM: MRI HEAD WITHOUT CONTRAST  MRA HEAD WITHOUT CONTRAST  TECHNIQUE: Multiplanar, multiecho pulse sequences of the brain and surrounding structures were obtained without intravenous contrast. Angiographic images of the head were obtained using MRA technique without contrast.  COMPARISON:  Head CT 07/30/2014. Head MRI 07/25/2014. Head MRA 08/17/2012.  FINDINGS: MRI HEAD  FINDINGS  Images are mildly degraded by motion.  There is a small, acute infarct centered in the posterior right corona radiata with a small amount of extension into the posterior aspects of the external and internal capsules. T2 hyperintensities in the periventricular white matter and pons are similar to the prior MRI and nonspecific but compatible with moderate chronic small vessel ischemic disease. A remote left occipital lobe infarct is again seen. Small, remote infarcts are again seen in the bilateral corona radiata, basal ganglia, thalami, and cerebellum. There is moderate cerebral atrophy. There is no evidence of intracranial hemorrhage, mass, midline shift, or extra-axial fluid collection.  Orbits are unremarkable. Paranasal sinuses are clear. There is a small right mastoid effusion. Major intracranial vascular flow  voids are preserved.  MRA HEAD FINDINGS  Images are moderately degraded by motion.  Visualized distal vertebral arteries are patent with the left being dominant. PICA origins are patent. SCA origins are patent. Mild irregularity is noted of the basilar artery without significant stenosis. There are patent bilateral posterior communicating arteries, with a fetal type origin of the left PCA. There is no evidence of high-grade proximal PCA stenosis. Moderate bilateral PCA branch vessel irregularity is noted.  Mild ectasia is again noted at the distal cervical left ICA. Internal carotid arteries are patent from skullbase to carotid termini and again demonstrate mild ectasia. Bilateral M1 segments are patent without evidence of high-grade stenosis. MCA bifurcations and proximal M2 segments are poorly evaluated due to motion, particularly on the right. There is diminished flow related enhancement proximally in the right M2 superior division, suggestive of either severe stenosis or segmental occlusion, with some diminished flow present in more distal right MCA branches compared to the left. Left M2  branches appear grossly patent, although evaluation for stenosis is limited by motion. There is no proximal ACA stenosis. ACA branch vessel irregularity is noted bilaterally.  IMPRESSION: 1. Small, acute infarct primarily involving the right corona radiata. 2. Remote infarcts and moderate chronic small vessel ischemic disease as above. 3. Moderately motion degraded MRA without large vessel occlusion. Proximal right M2 severe stenosis versus segmental occlusion.   Electronically Signed   By: Logan Bores   On: 07/30/2014 17:38    Microbiology: Recent Results (from the past 240 hour(s))  URINE CULTURE     Status: None   Collection Time    08/10/14  5:57 PM      Result Value Ref Range Status   Specimen Description URINE, CATHETERIZED   Final   Special Requests NONE   Final   Culture  Setup Time     Final   Value: 08/11/2014 03:28     Performed at [place]     Final   Value: NO GROWTH     Performed at [facility]   Culture     Final   Value: NO GROWTH     Performed at [facility]   Report Status 08/12/2014 FINAL   Final     Labs: Basic Metabolic Panel:  Recent Labs Lab 08/10/14 1800 08/10/14 1812 08/11/14 0435  NA 141 139 141  K 4.5 4.3 4.7  CL 103 103 105  CO2 28  --  29  GLUCOSE 97 97 112*  BUN 36* 34* 33*  CREATININE 1.08 1.10 1.14  CALCIUM 8.7  --  8.7   Liver Function Tests:  Recent Labs Lab 08/10/14 1800  AST 15  ALT 11  ALKPHOS 99  BILITOT <0.2*  PROT 6.4  ALBUMIN 2.5*   No results found for this basename: LIPASE, AMYLASE,  in the last 168 hours  Recent Labs Lab 08/10/14 1722  AMMONIA 13   CBC:  Recent Labs Lab 08/10/14 1800 08/10/14 1812 08/11/14 0435  WBC 8.2  --  10.2  NEUTROABS 6.7  --   --   HGB 8.7* 10.2* 8.7*  HCT 26.9* 30.0* 26.8*  MCV 88.5  --  91.5  PLT 309  --  278   Cardiac Enzymes:  Recent Labs Lab 08/10/14 1850  TROPONINI <0.30   BNP: BNP (last 3 results) No results found  for this basename: PROBNP,  in the last 8760 hours CBG:  Recent Labs Lab 08/10/14 [place]  63       Signed:  [place]  [facility] 08/13/2014, 7:18 AM

## 2014-08-13 NOTE — Progress Notes (Signed)
ANTIBIOTIC CONSULT NOTE - [place] for zosyn Indication: pneumonia  Allergies  Allergen Reactions  . Azithromycin Other (See Comments)    Altered mental status  . Diazepam Other (See Comments)    REACTION: agitation  . Ezetimibe-Simvastatin Other (See Comments)    Unknown, wife thinks it's muscle pain  . Morphine Other (See Comments)    Hallucinations.  . Prednisone Other (See Comments)    Dizziness  . Sulfonamide Derivatives Hives    Patient Measurements: Height: 5' 8.9" (175 cm) Weight: 112 lb 7 oz (51 kg) IBW/kg (Calculated) : 70.47 Adjusted Body Weight:   Vital Signs: Temp: 99.1 F (37.3 C) (08/18 0400) Temp src: Axillary (08/18 0400) BP: 111/69 mmHg (08/18 0400) Pulse Rate: 75 (08/18 0400) Intake/Output from previous day: 08/17 0701 - 08/18 0700 In: 045 [P.O.:840; I.V.:3; IV Piggyback:50] Out: -  Intake/Output from this shift:    Labs:  Recent Labs  08/10/14 1800 08/10/14 1812 08/11/14 0435  WBC 8.2  --  10.2  HGB 8.7* 10.2* 8.7*  PLT 309  --  278  CREATININE 1.08 1.10 1.14   Estimated Creatinine Clearance: 34.8 ml/min (by C-G formula based on Cr of 1.14). No results found for this basename: Letta Median, VANCORANDOM, GENTTROUGH, GENTPEAK, GENTRANDOM, TOBRATROUGH, TOBRAPEAK, TOBRARND, AMIKACINPEAK, AMIKACINTROU, AMIKACIN,  in the last 72 hours   Microbiology: Recent Results (from the past 720 hour(s))  URINE CULTURE     Status: None   Collection Time    07/20/14 12:37 PM      Result Value Ref Range Status   Specimen Description URINE, CLEAN CATCH   Final   Special Requests NONE   Final   Culture  Setup Time     Final   Value: 07/20/2014 19:21     Performed at [place]     Final   Value: 45,000 COLONIES/ML     Performed at [facility]   Culture     Final   Value: Multiple bacterial morphotypes present, none predominant. Suggest appropriate recollection if clinically indicated.      Performed at [facility]   Report Status 07/21/2014 FINAL   Final  MRSA PCR SCREENING     Status: Abnormal   Collection Time    07/20/14  5:17 PM      Result Value Ref Range Status   MRSA by PCR POSITIVE (*) NEGATIVE Final   Comment:            The GeneXpert MRSA Assay (FDA     approved for NASAL specimens     only), is one component of a     comprehensive MRSA colonization     surveillance program. It is not     intended to diagnose MRSA     infection nor to guide or     monitor treatment for     MRSA infections.     RESULT CALLED TO, READ BACK BY AND VERIFIED WITH:     Hiram Gash RN AT 1909 07/20/14 BY [place]  URINE CULTURE     Status: None   Collection Time    07/30/14 10:45 AM      Result Value Ref Range Status   Specimen Description URINE, RANDOM   Final   Special Requests NONE   Final   Culture  Setup Time     Final   Value: 07/30/2014 17:41     Performed at [place]  Final   Value: >=100,000 COLONIES/ML     Performed at [facility]   Culture     Final   Value: PSEUDOMONAS AERUGINOSA     ENTEROCOCCUS SPECIES     Performed at [facility]   Report Status 08/02/2014 FINAL   Final   Organism ID, Bacteria PSEUDOMONAS AERUGINOSA   Final   Organism ID, Bacteria ENTEROCOCCUS SPECIES   Final  CULTURE, BLOOD (ROUTINE X 2)     Status: None   Collection Time    08/02/14  3:40 PM      Result Value Ref Range Status   Specimen Description BLOOD LEFT ARM   Final   Special Requests BOTTLES DRAWN AEROBIC AND ANAEROBIC 5CC   Final   Culture  Setup Time     Final   Value: 08/02/2014 19:59     Performed at [facility]   Culture     Final   Value: NO GROWTH 5 DAYS     Performed at [facility]   Report Status 08/08/2014 FINAL   Final  CULTURE, BLOOD (ROUTINE X 2)     Status: None   Collection Time    08/02/14  3:50 PM      Result Value Ref Range Status   Specimen Description BLOOD LEFT HAND   Final    Special Requests BOTTLES DRAWN AEROBIC AND ANAEROBIC 5CC   Final   Culture  Setup Time     Final   Value: 08/02/2014 19:58     Performed at [facility]   Culture     Final   Value: NO GROWTH 5 DAYS     Performed at [facility]   Report Status 08/08/2014 FINAL   Final  URINE CULTURE     Status: None   Collection Time    08/02/14  5:32 PM      Result Value Ref Range Status   Specimen Description URINE, CATHETERIZED   Final   Special Requests Normal   Final   Culture  Setup Time     Final   Value: 08/03/2014 00:22     Performed at [place]     Final   Value: >=100,000 COLONIES/ML     Performed at [facility]   Culture     Final   Value: STAPHYLOCOCCUS SPECIES (COAGULASE NEGATIVE)     Note: RIFAMPIN AND GENTAMICIN SHOULD NOT BE USED AS SINGLE DRUGS FOR TREATMENT OF STAPH INFECTIONS.     ENTEROCOCCUS SPECIES     Performed at [facility]   Report Status 08/06/2014 FINAL   Final   Organism ID, Bacteria STAPHYLOCOCCUS SPECIES (COAGULASE NEGATIVE)   Final   Organism ID, Bacteria ENTEROCOCCUS SPECIES   Final  URINE CULTURE     Status: None   Collection Time    08/10/14  5:57 PM      Result Value Ref Range Status   Specimen Description URINE, CATHETERIZED   Final   Special Requests NONE   Final   Culture  Setup Time     Final   Value: 08/11/2014 03:28     Performed at [place]     Final   Value: NO GROWTH     Performed at [facility]   Culture     Final   Value: NO GROWTH     Performed at [facility]   Report Status 08/12/2014 FINAL  Final   Assessment: 80 yo who presented from [facility] for AMS. Initially started on IV vancomycin + zosyn which were discontinued yesterday. Now to restart zosyn. Pt with fever of 101.1. Last WBC was WNL on 8/16. Also started on doxy.   Vanc 8/15>>8/17 Zosyn 8/15>>8/17; 8/18>> Doxy 8/18>>  8/15 Urine - NEG  Goal of Therapy:   Eradication of infection  Plan:  1. Zosyn 3.375gm IV Q8H (4 hr inf) 2. F/u renal fxn, C&S, clinical status  Salome Arnt, PharmD, BCPS Pager # [phone] 08/13/2014 8:25 AM

## 2014-08-13 NOTE — Progress Notes (Signed)
[facility] Progress Note   [name] HWT:[id number] DOB: [birth year] DOA: 08/10/2014 PCP: Cathlean Cower, MD  Brief narrative: [name] is a 80 y.o. male who has a h/o prior CVA's - the last one a few wks ago left him with complete weakness of his left arm and leg and with dysphagia. He was in a rehab facility for the past couple of weeks and was noted by his wife to be somewhat confused, sleepy and not himself on 8/15. He was sent to the ER for further evaluation and was found to have a UTI and a RML infiltrate. He has not had any fever.    Subjective: Sleepy this AM, noted to have fever last night. Coughing with breakfast and falling asleep  Assessment/Plan: Principal Problem:   Acute encephalopathy - at this point it appears that he may have underlying infections that may be causing this- also noted is the fact that he is quite dehydration which may be adding to the overall picture  Active Problems:    UTI --  Hematuria--  NEOPLASM, MALIGNANT, BLADDER - no growth on culture therefore may have colonization - cont Vanc/Zosyn    HCAP (healthcare-associated pneumonia) - noted on CXR-- no symptoms of pneumonia as of yet- - possible aspiration? Was placed on pudding thick nectars after CVA- will cont this -the plan was to switch to Augmentin and Doxy for 7-10 days for treatment of Hcap - however, I feel that he needs at least 1 more day of IV antibiotics  in the hospital prior to d/c - hopefully can return to the facility tomorrow AM     Atrial fibrillation - rate controlled with Diltiazem, Digoxin and Amiodarone - cont Eliquis for stroke prevention - per wife, his last stroke occurred when his Xarelto was on hold and she is requesting that Eliquis not be held for any reason (currently has hematuria)    Late effects of CVA (cerebrovascular accident) -left arm 0/5 left leg 3/5 - cont dysphagia diet    HYPERTENSION - cont Cardizem   Code Status: Full code Family  Communication: with wife Disposition Plan: return to SNF for rehab in 1-2 days DVT prophylaxis: Eliquis  Consultants: none  Procedures: none  Antibiotics: Anti-infectives   Start     Dose/Rate Route Frequency Ordered Stop   08/13/14 1000  doxycycline (VIBRA-TABS) tablet 100 mg     100 mg Oral Every 12 hours 08/13/14 0726     08/13/14 1000  amoxicillin-clavulanate (AUGMENTIN) 600-42.9 MG/5ML suspension 876 mg  Status:  Discontinued     875 mg of amoxicillin Oral Every 12 hours 08/13/14 0734 08/13/14 0807   08/13/14 0900  piperacillin-tazobactam (ZOSYN) IVPB 3.375 g     3.375 g 12.5 mL/hr over 240 Minutes Intravenous Every 8 hours 08/13/14 0820     08/11/14 1800  vancomycin (VANCOCIN) IVPB 750 mg/150 ml premix  Status:  Discontinued     750 mg 150 mL/hr over 60 Minutes Intravenous Every 24 hours 08/10/14 2152 08/12/14 1233   08/11/14 0200  piperacillin-tazobactam (ZOSYN) IVPB 3.375 g  Status:  Discontinued     3.375 g 12.5 mL/hr over 240 Minutes Intravenous Every 8 hours 08/10/14 2152 08/12/14 1233   08/10/14 1945  vancomycin (VANCOCIN) 1,250 mg in sodium chloride 0.9 % 250 mL IVPB     1,250 mg 166.7 mL/hr over 90 Minutes Intravenous  Once 08/10/14 1931 08/10/14 2125   08/10/14 1815  vancomycin (VANCOCIN) 1,152 mg in sodium chloride 0.9 % 250 mL IVPB  Status:  Discontinued     20 mg/kg  57.6 kg 166.7 mL/hr over 90 Minutes Intravenous  Once 08/10/14 1813 08/10/14 1930   08/10/14 1815  piperacillin-tazobactam (ZOSYN) IVPB 3.375 g     3.375 g 100 mL/hr over 30 Minutes Intravenous  Once 08/10/14 1813 08/10/14 1922         Objective: Filed Weights   08/10/14 2237 08/12/14 0500 08/13/14 0400  Weight: 50.576 kg (111 lb 8 oz) 51.075 kg (112 lb 9.6 oz) 51 kg (112 lb 7 oz)    Intake/Output Summary (Last 24 hours) at 08/13/14 1405 Last data filed at 08/13/14 0900  Gross per 24 hour  Intake    603 ml  Output      0 ml  Net    603 ml     Vitals Filed Vitals:   08/13/14 0000  08/13/14 0214 08/13/14 0400 08/13/14 0905  BP: 134/49  111/69   Pulse: 77 74 75   Temp: 100 F (37.8 C)  99.1 F (37.3 C)   TempSrc: Axillary  Axillary   Resp: 18 20 18    Height:      Weight:   51 kg (112 lb 7 oz)   SpO2: 96% 96% 99% 95%    Exam: General: No acute respiratory distress Lungs: Clear to auscultation bilaterally without wheezes or crackles Cardiovascular: Regular rate and rhythm without murmur gallop or rub normal S1 and S2 Abdomen: Nontender, nondistended, soft, bowel sounds positive, no rebound, no ascites, no appreciable mass Extremities: No significant cyanosis, clubbing, or edema bilateral lower extremities  Data Reviewed: Basic Metabolic Panel:  Recent Labs Lab 08/10/14 1800 08/10/14 1812 08/11/14 0435 08/13/14 0830  NA 141 139 141 143  K 4.5 4.3 4.7 4.0  CL 103 103 105 107  CO2 28  --  29 25  GLUCOSE 97 97 112* 133*  BUN 36* 34* 33* 28*  CREATININE 1.08 1.10 1.14 0.90  CALCIUM 8.7  --  8.7 8.6   Liver Function Tests:  Recent Labs Lab 08/10/14 1800  AST 15  ALT 11  ALKPHOS 99  BILITOT <0.2*  PROT 6.4  ALBUMIN 2.5*   No results found for this basename: LIPASE, AMYLASE,  in the last 168 hours  Recent Labs Lab 08/10/14 1722  AMMONIA 13   CBC:  Recent Labs Lab 08/10/14 1800 08/10/14 1812 08/11/14 0435 08/13/14 0830  WBC 8.2  --  10.2 11.7*  NEUTROABS 6.7  --   --   --   HGB 8.7* 10.2* 8.7* 8.5*  HCT 26.9* 30.0* 26.8* 25.8*  MCV 88.5  --  91.5 89.3  PLT 309  --  278 274   Cardiac Enzymes:  Recent Labs Lab 08/10/14 1850  TROPONINI <0.30   BNP (last 3 results) No results found for this basename: PROBNP,  in the last 8760 hours CBG:  Recent Labs Lab 08/10/14 1735  GLUCAP 88    Recent Results (from the past 240 hour(s))  URINE CULTURE     Status: None   Collection Time    08/10/14  5:57 PM      Result Value Ref Range Status   Specimen Description URINE, CATHETERIZED   Final   Special Requests NONE   Final    Culture  Setup Time     Final   Value: 08/11/2014 03:28     Performed at [place]     Final   Value: NO GROWTH     Performed  at [facility]     Final   Value: NO GROWTH     Performed at [facility]   Report Status 08/12/2014 FINAL   Final     Studies:  Recent x-ray studies have been reviewed in detail by the Attending Physician  Scheduled Meds:  Scheduled Meds: . albuterol  2.5 mg Nebulization Q6H  . amiodarone  200 mg Oral BID  . antiseptic oral rinse  7 mL Mouth Rinse q12n4p  . apixaban  2.5 mg Oral BID  . atorvastatin  40 mg Oral q1800  . chlorhexidine  15 mL Mouth Rinse BID  . cholecalciferol  2,000 Units Oral Daily  . clotrimazole   Topical BID  . digoxin  0.125 mg Oral Daily  . diltiazem  180 mg Oral Daily  . docusate sodium  200 mg Oral BID  . doxycycline  100 mg Oral Q12H  . piperacillin-tazobactam (ZOSYN)  IV  3.375 g Intravenous Q8H  . sodium chloride  3 mL Intravenous Q12H  . trimethoprim-polymyxin b  1 drop Both Eyes 6 times per day   Continuous Infusions: . sodium chloride 100 mL/hr at 08/11/14 1432    Time spent on care of this patient: 35 min   [place], MD 08/13/2014, 2:05 PM  LOS: 3 days   [facility] Office  [phone] Pager - Text Page per [email or web address]  If 7PM-7AM, please contact night-coverage [email or web address]

## 2014-08-14 ENCOUNTER — Inpatient Hospital Stay (HOSPITAL_COMMUNITY): Payer: Medicare Other

## 2014-08-14 DIAGNOSIS — R319 Hematuria, unspecified: Secondary | ICD-10-CM

## 2014-08-14 DIAGNOSIS — I6350 Cerebral infarction due to unspecified occlusion or stenosis of unspecified cerebral artery: Secondary | ICD-10-CM

## 2014-08-14 DIAGNOSIS — J690 Pneumonitis due to inhalation of food and vomit: Secondary | ICD-10-CM

## 2014-08-14 DIAGNOSIS — R5381 Other malaise: Secondary | ICD-10-CM

## 2014-08-14 NOTE — Progress Notes (Signed)
[facility] PROGRESS NOTE  [name] MHD:[id number] DOB: 1927/06/26 DOA: 08/10/2014 PCP: Cathlean Cower, MD  Assessment/Plan: Acute encephalopathy  - likely secondary to toxic metabolic encephalopathy secondary to infection with dehydration  UTI -- Hematuria-- NEOPLASM, MALIGNANT, BLADDER  - no growth on culture therefore may have colonization  - currently cont Zosyn   HCAP (healthcare-associated pneumonia)  - CXR on 8/19 with markedly worsened pneumonia - Concerns for possible aspiration? Was placed on pudding thick nectars after CVA - Currently on zosyn  Atrial fibrillation  - rate controlled with Diltiazem, Digoxin and Amiodarone  - cont Eliquis for stroke prevention  - per wife, his last stroke occurred when his Xarelto was on hold and she is requesting that Eliquis not be held for any reason (currently has hematuria)   Late effects of CVA (cerebrovascular accident)  -left arm 0/5 left leg 3/5  - cont dysphagia diet   HYPERTENSION  - cont Cardizem  Code Status: DNR Family Communication: Pt and wife in room (indicate person spoken with, relationship, and if by phone, the number) Disposition Plan: Pending d/c when medically stable   Consultants:  none  Procedures:  none   HPI/Subjective: Reported fevers overnight.   Objective: Filed Vitals:   08/14/14 0124 08/14/14 0557 08/14/14 0813 08/14/14 1024  BP:  123/56  112/53  Pulse: 77 77    Temp:  97.8 F (36.6 C)    TempSrc:  Oral    Resp: 18 18    Height:      Weight:  50.7 kg (111 lb 12.4 oz)    SpO2: 97% 95% 95%     Intake/Output Summary (Last 24 hours) at 08/14/14 1313 Last data filed at 08/14/14 0850  Gross per 24 hour  Intake    480 ml  Output      0 ml  Net    480 ml   Filed Weights   08/12/14 0500 08/13/14 0400 08/14/14 0557  Weight: 51.075 kg (112 lb 9.6 oz) 51 kg (112 lb 7 oz) 50.7 kg (111 lb 12.4 oz)    Exam:   General:  Awake, in nad  Cardiovascular: regular, s1,  s2  Respiratory: normal resp effort, no wheezing  Abdomen: soft, nondistended  Musculoskeletal: perfused, no clubbing   Data Reviewed: Basic Metabolic Panel:  Recent Labs Lab 08/10/14 1800 08/10/14 1812 08/11/14 0435 08/13/14 0830  NA 141 139 141 143  K 4.5 4.3 4.7 4.0  CL 103 103 105 107  CO2 28  --  29 25  GLUCOSE 97 97 112* 133*  BUN 36* 34* 33* 28*  CREATININE 1.08 1.10 1.14 0.90  CALCIUM 8.7  --  8.7 8.6   Liver Function Tests:  Recent Labs Lab 08/10/14 1800  AST 15  ALT 11  ALKPHOS 99  BILITOT <0.2*  PROT 6.4  ALBUMIN 2.5*   No results found for this basename: LIPASE, AMYLASE,  in the last 168 hours  Recent Labs Lab 08/10/14 1722  AMMONIA 13   CBC:  Recent Labs Lab 08/10/14 1800 08/10/14 1812 08/11/14 0435 08/13/14 0830  WBC 8.2  --  10.2 11.7*  NEUTROABS 6.7  --   --   --   HGB 8.7* 10.2* 8.7* 8.5*  HCT 26.9* 30.0* 26.8* 25.8*  MCV 88.5  --  91.5 89.3  PLT 309  --  278 274   Cardiac Enzymes:  Recent Labs Lab 08/10/14 1850  TROPONINI <0.30   BNP (last 3 results) No results found for this basename: PROBNP,  in the last 8760 hours CBG:  Recent Labs Lab 08/10/14 1735  GLUCAP 88    Recent Results (from the past 240 hour(s))  URINE CULTURE     Status: None   Collection Time    08/10/14  5:57 PM      Result Value Ref Range Status   Specimen Description URINE, CATHETERIZED   Final   Special Requests NONE   Final   Culture  Setup Time     Final   Value: 08/11/2014 03:28     Performed at [place]     Final   Value: NO GROWTH     Performed at [facility]   Culture     Final   Value: NO GROWTH     Performed at [facility]   Report Status 08/12/2014 FINAL   Final     Studies: Dg Chest Port 1 View  08/14/2014   CLINICAL DATA:  Fever, pneumonia.  EXAM: PORTABLE CHEST - 1 VIEW  COMPARISON:  August 10, 2014.  FINDINGS: Stable cardiomediastinal silhouette. Significantly increased right  lower lobe airspace opacity is noted most consistent with pneumonia. Minimal right pleural effusion is noted. No pneumothorax is noted. Increase left lower lobe opacity is also noted concerning for possible pneumonia.  IMPRESSION: Significantly increased right lower lobe airspace opacity is noted concerning for pneumonia. Mild left lower lobe opacity is also noted concerning for pneumonia.   Electronically Signed   By: Sabino Dick M.D.   On: 08/14/2014 10:44    Scheduled Meds: . albuterol  2.5 mg Nebulization Q6H  . amiodarone  200 mg Oral BID  . antiseptic oral rinse  7 mL Mouth Rinse q12n4p  . apixaban  2.5 mg Oral BID  . atorvastatin  40 mg Oral q1800  . chlorhexidine  15 mL Mouth Rinse BID  . cholecalciferol  2,000 Units Oral Daily  . clotrimazole   Topical BID  . digoxin  0.125 mg Oral Daily  . diltiazem  180 mg Oral Daily  . docusate sodium  200 mg Oral BID  . doxycycline  100 mg Oral Q12H  . piperacillin-tazobactam (ZOSYN)  IV  3.375 g Intravenous Q8H  . sodium chloride  3 mL Intravenous Q12H  . trimethoprim-polymyxin b  1 drop Both Eyes 6 times per day   Continuous Infusions: . sodium chloride 100 mL/hr at 08/13/14 1757    Principal Problem:   Acute encephalopathy Active Problems:   NEOPLASM, MALIGNANT, BLADDER   HYPERLIPIDEMIA   ANEMIA-NOS   HYPERTENSION   COPD   UTI (lower urinary tract infection)   Atrial fibrillation   Hematuria   Dyslipidemia   HCAP (healthcare-associated pneumonia)   Late effects of CVA (cerebrovascular accident)  Time spent: 29min  [facility], [place] [facility] Pager [phone]. If 7PM-7AM, please contact night-coverage at [email or web address], password [facility] 08/14/2014, 1:13 PM  LOS: 4 days

## 2014-08-14 NOTE — Progress Notes (Signed)
Patient evaluated for community based chronic disease management services with [facility] as a benefit of patient's [employer]. Spoke with patient's wife at bedside to explain [facility] services.  Patient will not have [facility] follow up at this time as he is going to [facility].  Recommended chaplain services to his wife for caregiver stress.  Left contact information and [facility] literature at bedside. Made Inpatient Case Manager aware that [facility] following. Of note, [facility] Care Management services does not replace or interfere with any services that are arranged by inpatient case management or social work.  For additional questions or referrals please contact Corliss Blacker BSN RN [facility]iaison at [phone].

## 2014-08-15 LAB — CBC
HCT: 23.8 % — ABNORMAL LOW (ref 39.0–52.0)
HEMOGLOBIN: 7.9 g/dL — AB (ref 13.0–17.0)
MCH: 28.6 pg (ref 26.0–34.0)
MCHC: 33.2 g/dL (ref 30.0–36.0)
MCV: 86.2 fL (ref 78.0–100.0)
Platelets: UNDETERMINED 10*3/uL (ref 150–400)
RBC: 2.76 MIL/uL — AB (ref 4.22–5.81)
RDW: 14.8 % (ref 11.5–15.5)
WBC: 9.7 10*3/uL (ref 4.0–10.5)

## 2014-08-15 LAB — BASIC METABOLIC PANEL
ANION GAP: 12 (ref 5–15)
BUN: 32 mg/dL — AB (ref 6–23)
CHLORIDE: 107 meq/L (ref 96–112)
CO2: 26 meq/L (ref 19–32)
Calcium: 8.7 mg/dL (ref 8.4–10.5)
Creatinine, Ser: 0.97 mg/dL (ref 0.50–1.35)
GFR calc non Af Amer: 74 mL/min — ABNORMAL LOW (ref >90)
GFR, EST AFRICAN AMERICAN: 85 mL/min — AB (ref >90)
Glucose, Bld: 105 mg/dL — ABNORMAL HIGH (ref 70–99)
Potassium: 3.6 mEq/L — ABNORMAL LOW (ref 3.7–5.3)
Sodium: 145 mEq/L (ref 137–147)

## 2014-08-15 MED ORDER — MAGIC MOUTHWASH
5.0000 mL | Freq: Four times a day (QID) | ORAL | Status: DC | PRN
  Filled 2014-08-15 (×2): qty 5

## 2014-08-15 MED ORDER — AMOXICILLIN-POT CLAVULANATE 875-125 MG PO TABS
1.0000 | ORAL_TABLET | Freq: Two times a day (BID) | ORAL | Status: DC
  Administered 2014-08-15 – 2014-08-16 (×3): 1 via ORAL
  Filled 2014-08-15 (×4): qty 1

## 2014-08-15 NOTE — Progress Notes (Signed)
[facility] PROGRESS NOTE  [name] TGG:[id number] DOB: Apr 30, 1927 DOA: 08/10/2014 PCP: Cathlean Cower, MD  Assessment/Plan: Acute encephalopathy  - likely secondary to toxic metabolic encephalopathy secondary to infection with dehydration  UTI -- Hematuria-- NEOPLASM, MALIGNANT, BLADDER  - no growth on culture therefore may have colonization  - now off zosyn  HCAP (healthcare-associated pneumonia)  - CXR on 8/19 with markedly worsened pneumonia - Concerns for possible aspiration? Was placed on pudding thick nectars after CVA - Had restarted zosyn - Leukocytosis resolved and pt remained afebrile - Transition to augmentin to cover for aspiration PNA and cont doxy for now  Atrial fibrillation  - rate controlled with Diltiazem, Digoxin and Amiodarone  - cont Eliquis for stroke prevention  - per wife, his last stroke occurred when his Xarelto was on hold and she is requesting that Eliquis not be held for any reason (currently has hematuria)   Late effects of CVA (cerebrovascular accident)  - left arm 0/5 left leg 3/5  - cont dysphagia diet   HYPERTENSION  - cont Cardizem  Code Status: DNR Family Communication: Pt in room Disposition Plan: Possible to rehab 8/21 if stable and tolerating meds   Consultants:  none  Procedures:  none   HPI/Subjective: No acute events noted per staff   Objective: Filed Vitals:   08/15/14 0102 08/15/14 0356 08/15/14 0804 08/15/14 1150  BP:  116/69 127/62 109/57  Pulse: 75 78 103 88  Temp:  99.3 F (37.4 C) 97.9 F (36.6 C) 99.1 F (37.3 C)  TempSrc:  Oral Oral Oral  Resp: 18 18 18 18   Height:      Weight:  49.986 kg (110 lb 3.2 oz)    SpO2: 96% 95% 95% 94%    Intake/Output Summary (Last 24 hours) at 08/15/14 1454 Last data filed at 08/15/14 0909  Gross per 24 hour  Intake    700 ml  Output      0 ml  Net    700 ml   Filed Weights   08/13/14 0400 08/14/14 0557 08/15/14 0356  Weight: 51 kg (112 lb 7 oz) 50.7 kg (111  lb 12.4 oz) 49.986 kg (110 lb 3.2 oz)    Exam:   General:  Awake, in nad  Cardiovascular: regular, s1, s2  Respiratory: normal resp effort, no wheezing  Abdomen: soft, nondistended  Musculoskeletal: perfused, no clubbing   Data Reviewed: Basic Metabolic Panel:  Recent Labs Lab 08/10/14 1800 08/10/14 1812 08/11/14 0435 08/13/14 0830 08/15/14 0510  NA 141 139 141 143 145  K 4.5 4.3 4.7 4.0 3.6*  CL 103 103 105 107 107  CO2 28  --  29 25 26   GLUCOSE 97 97 112* 133* 105*  BUN 36* 34* 33* 28* 32*  CREATININE 1.08 1.10 1.14 0.90 0.97  CALCIUM 8.7  --  8.7 8.6 8.7   Liver Function Tests:  Recent Labs Lab 08/10/14 1800  AST 15  ALT 11  ALKPHOS 99  BILITOT <0.2*  PROT 6.4  ALBUMIN 2.5*   No results found for this basename: LIPASE, AMYLASE,  in the last 168 hours  Recent Labs Lab 08/10/14 1722  AMMONIA 13   CBC:  Recent Labs Lab 08/10/14 1800 08/10/14 1812 08/11/14 0435 08/13/14 0830 08/15/14 0510  WBC 8.2  --  10.2 11.7* 9.7  NEUTROABS 6.7  --   --   --   --   HGB 8.7* 10.2* 8.7* 8.5* 7.9*  HCT 26.9* 30.0* 26.8* 25.8* 23.8*  MCV 88.5  --  91.5 89.3 86.2  PLT 309  --  278 274 PLATELET CLUMPS NOTED ON SMEAR, UNABLE TO ESTIMATE   Cardiac Enzymes:  Recent Labs Lab 08/10/14 1850  TROPONINI <0.30   BNP (last 3 results) No results found for this basename: PROBNP,  in the last 8760 hours CBG:  Recent Labs Lab 08/10/14 1735  GLUCAP 88    Recent Results (from the past 240 hour(s))  URINE CULTURE     Status: None   Collection Time    08/10/14  5:57 PM      Result Value Ref Range Status   Specimen Description URINE, CATHETERIZED   Final   Special Requests NONE   Final   Culture  Setup Time     Final   Value: 08/11/2014 03:28     Performed at [name]     Final   Value: NO GROWTH     Performed at [facility]   Culture     Final   Value: NO GROWTH     Performed at [facility]   Report Status  08/12/2014 FINAL   Final  CULTURE, BLOOD (ROUTINE X 2)     Status: None   Collection Time    08/14/14 11:35 AM      Result Value Ref Range Status   Specimen Description BLOOD WRIST LEFT   Final   Special Requests BOTTLES DRAWN AEROBIC AND ANAEROBIC 5CC   Final   Culture  Setup Time     Final   Value: 08/14/2014 17:12     Performed at [facility]   Culture     Final   Value:        BLOOD CULTURE RECEIVED NO GROWTH TO DATE CULTURE WILL BE HELD FOR 5 DAYS BEFORE ISSUING A FINAL NEGATIVE REPORT     Performed at [facility]   Report Status PENDING   Incomplete  CULTURE, BLOOD (ROUTINE X 2)     Status: None   Collection Time    08/14/14 11:36 AM      Result Value Ref Range Status   Specimen Description BLOOD WRIST RIGHT   Final   Special Requests BOTTLES DRAWN AEROBIC AND ANAEROBIC 5CC   Final   Culture  Setup Time     Final   Value: 08/14/2014 17:13     Performed at [facility]   Culture     Final   Value:        BLOOD CULTURE RECEIVED NO GROWTH TO DATE CULTURE WILL BE HELD FOR 5 DAYS BEFORE ISSUING A FINAL NEGATIVE REPORT     Performed at [facility]   Report Status PENDING   Incomplete     Studies: Dg Chest Port 1 View  08/14/2014   CLINICAL DATA:  Fever, pneumonia.  EXAM: PORTABLE CHEST - 1 VIEW  COMPARISON:  August 10, 2014.  FINDINGS: Stable cardiomediastinal silhouette. Significantly increased right lower lobe airspace opacity is noted most consistent with pneumonia. Minimal right pleural effusion is noted. No pneumothorax is noted. Increase left lower lobe opacity is also noted concerning for possible pneumonia.  IMPRESSION: Significantly increased right lower lobe airspace opacity is noted concerning for pneumonia. Mild left lower lobe opacity is also noted concerning for pneumonia.   Electronically Signed   By: Sabino Dick M.D.   On: 08/14/2014 10:44    Scheduled Meds: . albuterol  2.5 mg Nebulization Q6H  . amiodarone  200 mg Oral  BID  .  amoxicillin-clavulanate  1 tablet Oral Q12H  . antiseptic oral rinse  7 mL Mouth Rinse q12n4p  . apixaban  2.5 mg Oral BID  . atorvastatin  40 mg Oral q1800  . chlorhexidine  15 mL Mouth Rinse BID  . cholecalciferol  2,000 Units Oral Daily  . clotrimazole   Topical BID  . digoxin  0.125 mg Oral Daily  . diltiazem  180 mg Oral Daily  . docusate sodium  200 mg Oral BID  . doxycycline  100 mg Oral Q12H  . sodium chloride  3 mL Intravenous Q12H  . trimethoprim-polymyxin b  1 drop Both Eyes 6 times per day   Continuous Infusions: . sodium chloride 100 mL/hr at 08/13/14 1757    Principal Problem:   Acute encephalopathy Active Problems:   NEOPLASM, MALIGNANT, BLADDER   HYPERLIPIDEMIA   ANEMIA-NOS   HYPERTENSION   COPD   UTI (lower urinary tract infection)   Atrial fibrillation   Hematuria   Dyslipidemia   HCAP (healthcare-associated pneumonia)   Late effects of CVA (cerebrovascular accident)  Time spent: 5min  Jsean Taussig, [facility] Pager [phone]. If 7PM-7AM, please contact night-coverage at [email or web address], password [facility] 08/15/2014, 2:54 PM  LOS: 5 days

## 2014-08-16 DIAGNOSIS — I69959 Hemiplegia and hemiparesis following unspecified cerebrovascular disease affecting unspecified side: Secondary | ICD-10-CM

## 2014-08-16 LAB — CBC WITH DIFFERENTIAL/PLATELET
Basophils Absolute: 0 10*3/uL (ref 0.0–0.1)
Basophils Relative: 0 % (ref 0–1)
EOS ABS: 0 10*3/uL (ref 0.0–0.7)
EOS PCT: 0 % (ref 0–5)
HEMATOCRIT: 24.9 % — AB (ref 39.0–52.0)
Hemoglobin: 8.1 g/dL — ABNORMAL LOW (ref 13.0–17.0)
LYMPHS PCT: 4 % — AB (ref 12–46)
Lymphs Abs: 0.4 10*3/uL — ABNORMAL LOW (ref 0.7–4.0)
MCH: 29.2 pg (ref 26.0–34.0)
MCHC: 32.5 g/dL (ref 30.0–36.0)
MCV: 89.9 fL (ref 78.0–100.0)
MONOS PCT: 5 % (ref 3–12)
Monocytes Absolute: 0.5 10*3/uL (ref 0.1–1.0)
Neutro Abs: 8.1 10*3/uL — ABNORMAL HIGH (ref 1.7–7.7)
Neutrophils Relative %: 91 % — ABNORMAL HIGH (ref 43–77)
Platelets: 310 10*3/uL (ref 150–400)
RBC: 2.77 MIL/uL — AB (ref 4.22–5.81)
RDW: 14.7 % (ref 11.5–15.5)
WBC: 9 10*3/uL (ref 4.0–10.5)

## 2014-08-16 LAB — COMPREHENSIVE METABOLIC PANEL
ALT: 15 U/L (ref 0–53)
ANION GAP: 12 (ref 5–15)
AST: 22 U/L (ref 0–37)
Albumin: 2 g/dL — ABNORMAL LOW (ref 3.5–5.2)
Alkaline Phosphatase: 83 U/L (ref 39–117)
BUN: 29 mg/dL — AB (ref 6–23)
CALCIUM: 8.7 mg/dL (ref 8.4–10.5)
CO2: 25 meq/L (ref 19–32)
Chloride: 111 mEq/L (ref 96–112)
Creatinine, Ser: 0.9 mg/dL (ref 0.50–1.35)
GFR calc non Af Amer: 76 mL/min — ABNORMAL LOW (ref >90)
GFR, EST AFRICAN AMERICAN: 88 mL/min — AB (ref >90)
GLUCOSE: 119 mg/dL — AB (ref 70–99)
Potassium: 3.6 mEq/L — ABNORMAL LOW (ref 3.7–5.3)
Sodium: 148 mEq/L — ABNORMAL HIGH (ref 137–147)
Total Bilirubin: 0.3 mg/dL (ref 0.3–1.2)
Total Protein: 6.2 g/dL (ref 6.0–8.3)

## 2014-08-16 MED ORDER — DOXYCYCLINE HYCLATE 100 MG PO TABS: 100.0000 mg | ORAL_TABLET | Freq: Two times a day (BID) | ORAL | Status: AC

## 2014-08-16 MED ORDER — SACCHAROMYCES BOULARDII 250 MG PO CAPS: 250.0000 mg | ORAL_CAPSULE | Freq: Two times a day (BID) | ORAL | Status: AC

## 2014-08-16 MED ORDER — AMOXICILLIN-POT CLAVULANATE 875-125 MG PO TABS: 1.0000 | ORAL_TABLET | Freq: Two times a day (BID) | ORAL | Status: AC

## 2014-08-16 NOTE — Progress Notes (Signed)
Report called to [facility], spoke to [employer]

## 2014-08-16 NOTE — Discharge Instructions (Signed)
Aspiration Pneumonia °Aspiration pneumonia is an infection in your lungs. It occurs when food, liquid, or stomach contents (vomit) are inhaled (aspirated) into your lungs. When these things get into your lungs, swelling (inflammation) and infection can occur. This can make it difficult for you to breathe. Aspiration pneumonia is a serious condition and can be life threatening. °RISK FACTORS °Aspiration pneumonia is more likely to occur when a person's cough (gag) reflex or ability to swallow has been decreased. Some things that can do this include:  °· Having a brain injury or disease, such as stroke, seizures, Parkinson's disease, dementia, or amyotrophic lateral sclerosis (ALS).   °· Being given general anesthetic for procedures.   °· Being in a coma (unconscious).   °· Having a narrowing of the tube that carries food to the stomach (esophagus).   °· Drinking too much alcohol. If a person passes out and vomits, vomit can be swallowed into the lungs.   °· Taking certain medicines, such as tranquilizers or sedatives.   °SIGNS AND SYMPTOMS  °· Coughing after swallowing food or liquids.   °· Breathing problems, such as wheezing or shortness of breath.   °· Bluish skin. This can be caused by lack of oxygen.   °· Coughing up food or mucus. The mucus might contain blood, greenish material, or yellowish-white fluid (pus).   °· Fever.   °· Chest pain.   °· Being more tired than usual (fatigue).   °· Sweating more than usual.   °· Bad breath.   °DIAGNOSIS  °A physical exam will be done. During the exam, the health care provider will listen to your lungs with a stethoscope to check for:  °· Crackling sounds in the lungs. °· Decreased breath sounds. °· A rapid heartbeat. °Various tests may be ordered. These may include:  °· Chest X-ray.   °· CT scan.   °· Swallowing study. This test looks at how food is swallowed and whether it goes into your breathing tube (trachea) or food pipe (esophagus).   °· Sputum culture. Saliva and  mucus (sputum) are collected from the lungs or the tubes that carry air to the lungs (bronchi). The sputum is then tested for bacteria.   °· Bronchoscopy. This test uses a flexible tube (bronchoscope) to see inside the lungs. °TREATMENT  °Treatment will usually include antibiotic medicines. Other medicines may also be used to reduce fever or pain. You may need to be treated in the hospital. In the hospital, your breathing will be carefully monitored. Depending on how well you are breathing, you may need to be given oxygen, or you may need breathing support from a breathing machine (ventilator). For people who fail a swallowing study, a feeding tube might be placed in the stomach, or they may be asked to avoid certain food textures or liquids when they eat. °HOME CARE INSTRUCTIONS  °· Carefully follow any special eating instructions you were given, such as avoiding certain food textures or thickening liquids. This reduces the risk of developing aspiration pneumonia again.  °· Only take over-the-counter or prescription medicines as directed by your health care provider. Follow the directions carefully.   °· If you were prescribed antibiotics, take them as directed. Finish them even if you start to feel better.   °· Rest as instructed by your health care provider.   °· Keep all follow-up appointments with your health care provider.   °SEEK MEDICAL CARE IF:  °· You develop worsening shortness of breath, wheezing, or difficulty breathing.   °· You develop a fever.   °· You have chest pain.   °MAKE SURE YOU:  °· Understand these instructions. °· Will watch your   condition. °· Will get help right away if you are not doing well or get worse. °Document Released: 10/10/2009 Document Revised: 12/18/2013 Document Reviewed: 05/31/2013 °Exit[employer]® Patient Information ©2015 [employer]. This information is not intended to replace advice given to you by your health care provider. Make sure you discuss any questions you have with  your health care provider. ° °

## 2014-08-16 NOTE — Clinical Social Work Note (Signed)
Patient for d/c today to SNF bed at [facility]. Patient and wife, Ronney Lion,  agreeable to this plan- plan transfer via EMS. Eduard Clos, MSW, [place]

## 2014-08-16 NOTE — Discharge Summary (Signed)
Physician Discharge Summary  [name] BPZ:[id number] DOB: 05/30/1927 DOA: 08/10/2014  PCP: Cathlean Cower, MD  Admit date: 08/10/2014 Discharge date: 08/16/2014  Time spent: 35 minutes  Recommendations for Outpatient Follow-up:  1. Follow up with PCP in 1-2 weeks 2. Aspiration precautions please  Discharge Diagnoses:  Principal Problem:   Acute encephalopathy Active Problems:   NEOPLASM, MALIGNANT, BLADDER   HYPERLIPIDEMIA   ANEMIA-NOS   HYPERTENSION   COPD   UTI (lower urinary tract infection)   Atrial fibrillation   Hematuria   Dyslipidemia   HCAP (healthcare-associated pneumonia)   Late effects of CVA (cerebrovascular accident)   Discharge Condition: Improved  Diet recommendation: Dysphagia 1 with thick liquids  Filed Weights   08/14/14 0557 08/15/14 0356 08/16/14 0400  Weight: 50.7 kg (111 lb 12.4 oz) 49.986 kg (110 lb 3.2 oz) 53.388 kg (117 lb 11.2 oz)    History of present illness:  See admit h and p from 8/15 for details. Briefly, pt presents to the hospital with increased lethargy, being found to have findings suggestive of UTI and pna. Pt was admitted for further work up  Hospital Course:  Acute encephalopathy  - likely secondary to toxic metabolic encephalopathy secondary to infection with dehydration  UTI -- Hematuria-- NEOPLASM, MALIGNANT, BLADDER  - no growth on culture therefore may have colonization  - now off zosyn  HCAP (healthcare-associated pneumonia) vs Aspiration Pna - CXR on 8/19 with markedly worsened pneumonia  - Concerns for possible aspiration? Was placed on pudding thick nectars after CVA  - Had restarted zosyn  - Leukocytosis resolved and pt remained afebrile  - Transition to augmentin to cover for aspiration PNA and cont doxy for now  Atrial fibrillation  - rate controlled with Diltiazem, Digoxin and Amiodarone  - cont Eliquis for stroke prevention  - per wife, his last stroke occurred when his Xarelto was on hold and she is requesting  that Eliquis not be held for any reason (currently has hematuria)  Late effects of CVA (cerebrovascular accident)  - left arm 0/5 left leg 3/5  - cont dysphagia diet  HYPERTENSION  - cont Cardizem  Procedures: None  Consultations:  None  Discharge Exam: Filed Vitals:   08/16/14 0110 08/16/14 0400 08/16/14 0731 08/16/14 0810  BP:  134/74 129/75   Pulse:  78 127   Temp:  97.8 F (36.6 C) 98.1 F (36.7 C)   TempSrc:  Axillary Axillary   Resp:  18 18   Height:      Weight:  53.388 kg (117 lb 11.2 oz)    SpO2: 92% 95% 93% 95%    General: Awake, in nad Cardiovascular: irregularly irregular, s1, s2, rate controlled Respiratory: normal resp effort, no wheezing  Discharge Instructions     Medication List    STOP taking these medications       ciprofloxacin 500 MG tablet  Commonly known as:  CIPRO      TAKE these medications       albuterol (2.5 MG/3ML) 0.083% nebulizer solution  Commonly known as:  PROVENTIL  Take 3 mLs (2.5 mg total) by nebulization every 6 (six) hours.     amiodarone 200 MG tablet  Commonly known as:  PACERONE  Take 200 mg by mouth 2 (two) times daily. Present dose for 2 weeks then 200 mg daily after 2 weeks. Unknown start date     amoxicillin-clavulanate 875-125 MG per tablet  Commonly known as:  AUGMENTIN  Take 1 tablet by mouth every 12 (twelve)  hours.     apixaban 2.5 MG Tabs tablet  Commonly known as:  ELIQUIS  Take 2.5 mg by mouth 2 (two) times daily.     atorvastatin 40 MG tablet  Commonly known as:  LIPITOR  Take 1 tablet (40 mg total) by mouth daily at 6 PM.     bacitracin-polymyxin b ointment  Commonly known as:  POLYSPORIN  Apply 1 application topically every 12 (twelve) hours.     bacitracin-polymyxin b ophthalmic ointment  Commonly known as:  POLYSPORIN  Place into both eyes every 4 (four) hours. apply to eye every 12 hours while awake     digoxin 0.125 MG tablet  Commonly known as:  LANOXIN  Take 1 tablet (0.125 mg  total) by mouth daily.     diltiazem 180 MG 24 hr capsule  Commonly known as:  CARDIZEM CD  Take 1 capsule (180 mg total) by mouth daily.     doxycycline 100 MG tablet  Commonly known as:  VIBRA-TABS  Take 1 tablet (100 mg total) by mouth every 12 (twelve) hours.     DSS 100 MG Caps  Take 200 mg by mouth 2 (two) times daily.     food thickener Powd  Commonly known as:  THICK IT  Take 1 Container by mouth as needed. HONEY THICK     nitrofurantoin (macrocrystal-monohydrate) 100 MG capsule  Commonly known as:  MACROBID  Take 100 mg by mouth every 12 (twelve) hours.     saccharomyces boulardii 250 MG capsule  Commonly known as:  FLORASTOR  Take 1 capsule (250 mg total) by mouth 2 (two) times daily.     Vitamin D 2000 UNITS tablet  Take 2,000 Units by mouth daily.       Allergies  Allergen Reactions  . Azithromycin Other (See Comments)    Altered mental status  . Diazepam Other (See Comments)    REACTION: agitation  . Ezetimibe-Simvastatin Other (See Comments)    Unknown, wife thinks it's muscle pain  . Morphine Other (See Comments)    Hallucinations.  . Prednisone Other (See Comments)    Dizziness  . Sulfonamide Derivatives Hives   Follow-up Information   Follow up with Cathlean Cower, MD. Schedule an appointment as soon as possible for a visit in 1 week.   Specialties:  Internal Medicine, Radiology   Contact information:   [address] [place] [address] [phone]        The results of significant diagnostics from this hospitalization (including imaging, microbiology, ancillary and laboratory) are listed below for reference.    Significant Diagnostic Studies: Dg Chest 2 View  07/30/2014   CLINICAL DATA:  Acute stroke.  EXAM: CHEST  2 VIEW  COMPARISON:  07/20/2014 and 01/06/2014  FINDINGS: There is severe emphysema. There is benign calcification in the right midzone, stable. Heart size and vascularity are normal. No acute osseous abnormalities. Diffuse  osteopenia. Chronic by a lateral apical pleural thickening, left greater than right.  IMPRESSION: No acute abnormalities.  Severe emphysema.   Electronically Signed   By: Rozetta Nunnery M.D.   On: 07/30/2014 17:21   Ct Head Wo Contrast  08/10/2014   CLINICAL DATA:  Altered consciousness.  Lethargy.  EXAM: CT HEAD WITHOUT CONTRAST  TECHNIQUE: Contiguous axial images were obtained from the base of the skull through the vertex without intravenous contrast.  COMPARISON:  07/30/2014 and MRI of 07/30/2014  FINDINGS: Sinuses/Soft tissues: Cerumen in the right external ear canal. Clear paranasal  sinuses and mastoid air cells.  Intracranial: Advanced cerebral atrophy. Moderate to marked low density in the periventricular white matter likely related to small vessel disease. Hypoattenuation in the right corona radiata on image 17 is like due to evolving subacute to chronic infarct. Scattered remote lacunar infarcts in the basal ganglia. Remote left occipital lobe cortical based infarct.  No hemorrhage, hydrocephalus, intra-axial, or extra-axial fluid collection.  IMPRESSION: 1.  No acute intracranial abnormality. 2.  Cerebral atrophy and small vessel ischemic change. 3. Subacute to chronic right corona radiata infarct, as detailed on prior MRI.   Electronically Signed   By: Abigail Miyamoto M.D.   On: 08/10/2014 18:26   Ct Head Wo Contrast  07/30/2014   CLINICAL DATA:  Slurred speech  EXAM: CT HEAD WITHOUT CONTRAST  TECHNIQUE: Contiguous axial images were obtained from the base of the skull through the vertex without intravenous contrast.  COMPARISON:  07/25/2014  FINDINGS: The bony calvarium is intact. Atrophic changes are noted. A stable left occipital infarct is noted when compared with the prior exam. Scattered lacunar infarcts are noted within the deep white matter bilaterally and also stable. Chronic white matter ischemic changes noted. No acute hemorrhage, acute infarction or space-occupying mass lesion is identified.   IMPRESSION: Chronic changes stable from recent MRI examination. No acute abnormality is noted.   Electronically Signed   By: Inez Catalina M.D.   On: 07/30/2014 10:00   Ct Head Wo Contrast  07/20/2014   CLINICAL DATA:  Generalized weakness and mental status change.  EXAM: CT HEAD WITHOUT CONTRAST  TECHNIQUE: Contiguous axial images were obtained from the base of the skull through the vertex without intravenous contrast.  COMPARISON:  01/05/2014.  FINDINGS: Stable age related cerebral atrophy, ventriculomegaly, periventricular white matter disease and remote lacunar-type basal ganglia infarcts. Evolutionary change in the left PCA infarct noted on the prior MRI with encephalomalacia. No definite acute infarct and no intracranial hemorrhage, mass lesion or extra-axial fluid collection.  The bony structures are intact. The paranasal sinuses and mastoid air cells are grossly clear. The globes are intact.  IMPRESSION: 1. Stable age related cerebral atrophy, ventriculomegaly and periventricular white matter disease. 2. Remote lacunar-type basal ganglia infarcts an remote left PCA infarct. 3. No acute intracranial findings, mass lesion or skull fracture.   Electronically Signed   By: Kalman Jewels M.D.   On: 07/20/2014 13:23   Mr Brain Wo Contrast  07/30/2014   CLINICAL DATA:  New left-sided weakness. Straight. History of multiple prior strokes. Atrial fibrillation.  EXAM: MRI HEAD WITHOUT CONTRAST  MRA HEAD WITHOUT CONTRAST  TECHNIQUE: Multiplanar, multiecho pulse sequences of the brain and surrounding structures were obtained without intravenous contrast. Angiographic images of the head were obtained using MRA technique without contrast.  COMPARISON:  Head CT 07/30/2014. Head MRI 07/25/2014. Head MRA 08/17/2012.  FINDINGS: MRI HEAD FINDINGS  Images are mildly degraded by motion.  There is a small, acute infarct centered in the posterior right corona radiata with a small amount of extension into the posterior aspects  of the external and internal capsules. T2 hyperintensities in the periventricular white matter and pons are similar to the prior MRI and nonspecific but compatible with moderate chronic small vessel ischemic disease. A remote left occipital lobe infarct is again seen. Small, remote infarcts are again seen in the bilateral corona radiata, basal ganglia, thalami, and cerebellum. There is moderate cerebral atrophy. There is no evidence of intracranial hemorrhage, mass, midline shift, or extra-axial fluid collection.  Orbits  are unremarkable. Paranasal sinuses are clear. There is a small right mastoid effusion. Major intracranial vascular flow voids are preserved.  MRA HEAD FINDINGS  Images are moderately degraded by motion.  Visualized distal vertebral arteries are patent with the left being dominant. PICA origins are patent. SCA origins are patent. Mild irregularity is noted of the basilar artery without significant stenosis. There are patent bilateral posterior communicating arteries, with a fetal type origin of the left PCA. There is no evidence of high-grade proximal PCA stenosis. Moderate bilateral PCA branch vessel irregularity is noted.  Mild ectasia is again noted at the distal cervical left ICA. Internal carotid arteries are patent from skullbase to carotid termini and again demonstrate mild ectasia. Bilateral M1 segments are patent without evidence of high-grade stenosis. MCA bifurcations and proximal M2 segments are poorly evaluated due to motion, particularly on the right. There is diminished flow related enhancement proximally in the right M2 superior division, suggestive of either severe stenosis or segmental occlusion, with some diminished flow present in more distal right MCA branches compared to the left. Left M2 branches appear grossly patent, although evaluation for stenosis is limited by motion. There is no proximal ACA stenosis. ACA branch vessel irregularity is noted bilaterally.  IMPRESSION: 1.  Small, acute infarct primarily involving the right corona radiata. 2. Remote infarcts and moderate chronic small vessel ischemic disease as above. 3. Moderately motion degraded MRA without large vessel occlusion. Proximal right M2 severe stenosis versus segmental occlusion.   Electronically Signed   By: Logan Bores   On: 07/30/2014 17:38   Mr Brain Wo Contrast  07/25/2014   CLINICAL DATA:  Stroke  EXAM: MRI HEAD WITHOUT CONTRAST  TECHNIQUE: Multiplanar, multiecho pulse sequences of the brain and surrounding structures were obtained without intravenous contrast.  COMPARISON:  MRI 01/06/2014  FINDINGS: Generalized atrophy. Multiple areas of chronic infarction including the cerebral white matter bilaterally, and the basal ganglia bilaterally. Chronic infarct left occipital lobe which contains an area of hyperintensity on diffusion-weighted imaging. Favor chronic blood products although small area of acute infarct is possible. Correlate with any acute visual changes. No other areas of acute infarct.  Negative for mass lesion.  No shift of the midline structures.  Paranasal sinuses are clear.  IMPRESSION: Atrophy and moderately severe chronic ischemic change.  Chronic left occipital infarct as noted on the prior study. Small area of diffusion hyperintensity on the posterior aspect of this infarct. Favor chronic blood products versus a small area of acute infarct posterior to the encephalomalacia.   Electronically Signed   By: Franchot Gallo M.D.   On: 07/25/2014 10:09   Dg Chest Port 1 View  08/14/2014   CLINICAL DATA:  Fever, pneumonia.  EXAM: PORTABLE CHEST - 1 VIEW  COMPARISON:  August 10, 2014.  FINDINGS: Stable cardiomediastinal silhouette. Significantly increased right lower lobe airspace opacity is noted most consistent with pneumonia. Minimal right pleural effusion is noted. No pneumothorax is noted. Increase left lower lobe opacity is also noted concerning for possible pneumonia.  IMPRESSION:  Significantly increased right lower lobe airspace opacity is noted concerning for pneumonia. Mild left lower lobe opacity is also noted concerning for pneumonia.   Electronically Signed   By: Sabino Dick M.D.   On: 08/14/2014 10:44   Dg Chest Portable 1 View  08/10/2014   CLINICAL DATA:  Altered mental status.  Weakness.  EXAM: PORTABLE CHEST - 1 VIEW  COMPARISON:  08/02/2014  FINDINGS: Hyperinflation. Numerous leads and wires project over the chest.  Midline trachea. Moderate cardiomegaly with atherosclerosis in the transverse aorta. Trace right pleural fluid or thickening blunts the costophrenic angle. There is also left greater than right pleural parenchymal scarring at the apices.  No congestive failure. Patchy medial right lung base airspace disease.  IMPRESSION: Medial right lung base airspace disease, suspicious for infection or aspiration.  Cardiomegaly and hyperinflation, without congestive failure.  Trace right pleural fluid or thickening.   Electronically Signed   By: Abigail Miyamoto M.D.   On: 08/10/2014 17:55   Dg Chest Port 1 View  08/02/2014   CLINICAL DATA:  Fever  EXAM: PORTABLE CHEST - 1 VIEW  COMPARISON:  07/30/2014  FINDINGS: Diffuse interstitial edema and vascular congestion have developed. Mild cardiomegaly. No pneumothorax. No pleural effusion. Chronic pleural changes.  IMPRESSION: New development of interstitial edema.   Electronically Signed   By: Maryclare Bean M.D.   On: 08/02/2014 16:25   Dg Chest Portable 1 View  07/20/2014   CLINICAL DATA:  Chest pain  EXAM: PORTABLE CHEST - 1 VIEW  COMPARISON:  January 06, 2014  FINDINGS: There is underlying emphysematous change. There are areas of mild scarring bilaterally. There is no frank edema or consolidation. Heart is upper normal in size with pulmonary vascularity reflecting the underlying emphysematous change. There is stable apical pleural thickening bilaterally, slightly more on the left than on the right. No adenopathy. There is  atherosclerotic change in the aorta.  IMPRESSION: Underlying emphysema with areas of lung scarring. No edema or consolidation.   Electronically Signed   By: Lowella Grip M.D.   On: 07/20/2014 12:40   Dg Swallowing Func-speech Pathology  07/31/2014   Loletha Grayer, [place]     07/31/2014 12:17 PM Objective Swallowing Evaluation: Modified Barium Swallowing Study   Patient Details  Name: [name] MRN: [id number] Date of Birth: Mar 17, 1927  Today's Date: 07/31/2014 Time: 4196-2229 SLP Time Calculation (min): 26 min  Past Medical History:  Past Medical History  Diagnosis Date  . Bladder cancer     a. Dr. Lawerance Bach - [facility]. Multiple surgeries, chemo trial.  . Hyperlipidemia   . Anemia 04/10/2009  . Immune thrombocytopenic purpura     a. s/p splenectomy.  Marland Kitchen Anxiety   . Hypertension   . Permanent atrial fibrillation     a. Historically difficult to control rates  . COPD   . TIA (transient ischemic attack)     a. H/o TIA 2007.  . Stroke     a. H/o remote lacunar infarcts, at least 3 of them. b. Right  lenticular nucleus and corona radiata infarct, cardioembolic  06/9891. c. Stroke 11/2013, new infarct 2 weeks later 12/2013.  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 Gross hematuria     a. H/o bladder CA with intermittent gross hematuria.  Marland Kitchen Dysphagia   . Anemia    Past Surgical History:  Past Surgical History  Procedure Laterality Date  . Splenectomy    . Bilateral vats ablation    . Facial cancer      facial skin cancer   HPI:  [name] is a 80 y.o. male, with H/O multiple bilateral CVAs,  at baseline left side stronger than right, atrial fibrillation on  xaralto, hypertension, COPD, UTI, chronic intermittent hematuria,  who was recently discharged to a nursing home from this facility  after a UTI related admission, Pt was admitted from [facility] 07/30/14  with slurred speech and new onset dense left-sided weakness. MRI  revealed a small,  acute infarct primarily involving the right  corona radiata.     Assessment / Plan / Recommendation Clinical Impression  Dysphagia Diagnosis: Moderate oral phase dysphagia;Severe oral  phase dysphagia;Moderate pharyngeal phase dysphagia;Severe  pharyngeal phase dysphagia  Clinical Impression:   Pt presents with a moderate-severe  oropharyngeal dysphagia that is sensorimotor based. Pt's oral  phase is characterized by weak and discoordinated manipulation  with lingual pumping and decreased bolus cohesion, leading to  both anterior and posterior loss. Pt exhibited very delayed  mastication with soft solid with residuals remaining in oral  cavity. Unfortunately, all liquid consistencies tested resulted  in silent aspiration or deep penetration to the vocal folds,  which occurred before the swallow with prematurely spilled  material. Solid PO was not observed to enter the airway, although  pharyngeal weakness led to mild-moderate diffuse residue,  particularly at the valleculae. Recommend to initiate Dys 1  textures and pudding thick liquids with pharyngeal strengthening  exercises to maximize strength and efficiency.    Treatment Recommendation  Therapy as outlined in treatment plan below    Diet Recommendation Dysphagia 1 (Puree);Pudding-thick liquid   Liquid Administration via: Spoon Medication Administration: Crushed with puree Supervision: Patient able to self feed;Staff to assist with self  feeding;Full supervision/cueing for compensatory strategies Compensations: Slow rate;Small sips/bites;Check for  pocketing;Check for anterior loss;Multiple dry swallows after  each bite/sip Postural Changes and/or Swallow Maneuvers: Seated upright 90  degrees;Upright 30-60 min after meal    Other  Recommendations Recommended Consults: MBS Oral Care Recommendations: Oral care BID Other Recommendations: Order thickener from pharmacy;Prohibited  food (jello, ice cream, thin soups);Remove water pitcher;Have  oral suction available    Follow Up Recommendations  Inpatient Rehab;24 hour supervision/assistance    Frequency and Duration min 2x/week  2 weeks   Pertinent Vitals/Pain n/a    SLP Swallow Goals     General Date of Onset: 07/30/14 HPI: Dierks Wach is a 80 y.o. male, with H/O multiple bilateral  CVAs, at baseline left side stronger than right, atrial  fibrillation on xaralto, hypertension, COPD, UTI, chronic  intermittent hematuria, who was recently discharged to a nursing  home from this facility after a UTI related admission, Pt was  admitted from [facility] 07/30/14 with slurred speech and new onset dense  left-sided weakness. MRI revealed a small, acute infarct  primarily involving the right corona radiata. Type of Study: Modified Barium Swallowing Study Reason for Referral: Objectively evaluate swallowing function Previous Swallow Assessment: most recent MBS 12/2013 recommending  Dys 1 and honey. Pt discharged from that admission on Dys 3 and  nectar with the water protocol. Diet Prior to this Study: NPO Temperature Spikes Noted: No Respiratory Status: Room air History of Recent Intubation: No Behavior/Cognition: Alert;Cooperative;Pleasant mood;Requires  cueing Oral Cavity - Dentition: Adequate natural dentition Oral Motor / Sensory Function: Impaired - see Bedside swallow  eval Self-Feeding Abilities: Needs assist Patient Positioning: Upright in chair Baseline Vocal Quality: Low vocal intensity Volitional Cough: Weak Volitional Swallow: Able to elicit Anatomy: Within functional limits Pharyngeal Secretions: Not observed secondary MBS    Reason for Referral Objectively evaluate swallowing function   Oral Phase Oral Preparation/Oral Phase Oral Phase: Impaired Oral - Honey Oral - Honey Teaspoon: Left anterior bolus loss;Weak lingual  manipulation;Lingual pumping;Reduced posterior propulsion;Delayed  oral transit Oral - Honey Cup: Left anterior bolus loss;Weak lingual  manipulation;Lingual pumping;Reduced posterior propulsion;Delayed  oral  transit Oral - Nectar Oral -  Nectar Teaspoon: Left anterior bolus loss;Weak lingual  manipulation;Lingual pumping;Reduced posterior propulsion;Delayed  oral transit Oral - Nectar Cup: Left anterior bolus loss;Weak lingual  manipulation;Lingual pumping;Reduced posterior propulsion;Delayed  oral transit Oral - Solids Oral - Puree: Left anterior bolus loss;Weak lingual  manipulation;Lingual pumping;Reduced posterior propulsion;Delayed  oral transit Oral - Mechanical Soft: Left anterior bolus loss;Weak lingual  manipulation;Lingual pumping;Reduced posterior propulsion;Delayed  oral transit;Impaired mastication;Lingual/palatal residue   Pharyngeal Phase Pharyngeal Phase Pharyngeal Phase: Impaired Pharyngeal - Honey Pharyngeal - Honey Teaspoon: Premature spillage to pyriform  sinuses;Reduced anterior laryngeal mobility;Reduced laryngeal  elevation;Reduced tongue base retraction;Penetration/Aspiration  before swallow;Reduced pharyngeal peristalsis;Pharyngeal residue  - valleculae;Pharyngeal residue - pyriform sinuses;Pharyngeal  residue - posterior pharnyx Penetration/Aspiration details (honey teaspoon): Material enters  airway, passes BELOW cords without attempt by patient to eject  out (silent aspiration) Pharyngeal - Honey Cup: Premature spillage to pyriform  sinuses;Reduced anterior laryngeal mobility;Reduced laryngeal  elevation;Reduced tongue base retraction;Penetration/Aspiration  before swallow;Reduced pharyngeal peristalsis;Pharyngeal residue  - valleculae;Pharyngeal residue - pyriform sinuses;Pharyngeal  residue - posterior pharnyx Penetration/Aspiration details (honey cup): Material enters  airway, passes BELOW cords without attempt by patient to eject  out (silent aspiration) Pharyngeal - Nectar Pharyngeal - Nectar Teaspoon: Premature spillage to pyriform  sinuses;Reduced anterior laryngeal mobility;Reduced laryngeal  elevation;Reduced tongue base retraction;Penetration/Aspiration  before swallow;Reduced  pharyngeal peristalsis;Pharyngeal residue  - valleculae;Pharyngeal residue - pyriform sinuses;Pharyngeal  residue - posterior pharnyx Penetration/Aspiration details (nectar teaspoon): Material enters  airway, CONTACTS cords and not ejected out Pharyngeal - Nectar Cup: Premature spillage to pyriform  sinuses;Reduced anterior laryngeal mobility;Reduced laryngeal  elevation;Reduced tongue base retraction;Penetration/Aspiration  before swallow;Reduced pharyngeal peristalsis;Pharyngeal residue  - valleculae;Pharyngeal residue - pyriform sinuses;Pharyngeal  residue - posterior pharnyx Penetration/Aspiration details (nectar cup): Material enters  airway, passes BELOW cords without attempt by patient to eject  out (silent aspiration) Pharyngeal - Solids Pharyngeal - Puree: Reduced anterior laryngeal mobility;Reduced  laryngeal elevation;Reduced tongue base retraction;Reduced  pharyngeal peristalsis;Pharyngeal residue - valleculae;Pharyngeal  residue - pyriform sinuses;Pharyngeal residue - posterior  pharnyx;Premature spillage to valleculae Penetration/Aspiration details (puree): Material does not enter  airway Pharyngeal - Mechanical Soft: Reduced anterior laryngeal  mobility;Reduced laryngeal elevation;Reduced tongue base  retraction;Reduced pharyngeal peristalsis;Pharyngeal residue -  valleculae;Pharyngeal residue - pyriform sinuses;Pharyngeal  residue - posterior pharnyx;Premature spillage to valleculae Penetration/Aspiration details (mechanical soft): Material does  not enter airway  Cervical Esophageal Phase    GO    Cervical Esophageal Phase Cervical Esophageal Phase: [facility]        Germain Osgood, M.A. CCC-SLP [phone]  Germain Osgood 07/31/2014, 12:13 PM    Mr Jodene Nam Head/brain Wo Cm  07/30/2014   CLINICAL DATA:  New left-sided weakness. Straight. History of multiple prior strokes. Atrial fibrillation.  EXAM: MRI HEAD WITHOUT CONTRAST  MRA HEAD WITHOUT CONTRAST  TECHNIQUE: Multiplanar, multiecho pulse sequences  of the brain and surrounding structures were obtained without intravenous contrast. Angiographic images of the head were obtained using MRA technique without contrast.  COMPARISON:  Head CT 07/30/2014. Head MRI 07/25/2014. Head MRA 08/17/2012.  FINDINGS: MRI HEAD FINDINGS  Images are mildly degraded by motion.  There is a small, acute infarct centered in the posterior right corona radiata with a small amount of extension into the posterior aspects of the external and internal capsules. T2 hyperintensities in the periventricular white matter and pons are similar to the prior MRI and nonspecific but compatible with moderate chronic small vessel ischemic disease. A remote left occipital lobe infarct is again seen. Small, remote infarcts are again seen in the bilateral corona  radiata, basal ganglia, thalami, and cerebellum. There is moderate cerebral atrophy. There is no evidence of intracranial hemorrhage, mass, midline shift, or extra-axial fluid collection.  Orbits are unremarkable. Paranasal sinuses are clear. There is a small right mastoid effusion. Major intracranial vascular flow voids are preserved.  MRA HEAD FINDINGS  Images are moderately degraded by motion.  Visualized distal vertebral arteries are patent with the left being dominant. PICA origins are patent. SCA origins are patent. Mild irregularity is noted of the basilar artery without significant stenosis. There are patent bilateral posterior communicating arteries, with a fetal type origin of the left PCA. There is no evidence of high-grade proximal PCA stenosis. Moderate bilateral PCA branch vessel irregularity is noted.  Mild ectasia is again noted at the distal cervical left ICA. Internal carotid arteries are patent from skullbase to carotid termini and again demonstrate mild ectasia. Bilateral M1 segments are patent without evidence of high-grade stenosis. MCA bifurcations and proximal M2 segments are poorly evaluated due to motion, particularly on  the right. There is diminished flow related enhancement proximally in the right M2 superior division, suggestive of either severe stenosis or segmental occlusion, with some diminished flow present in more distal right MCA branches compared to the left. Left M2 branches appear grossly patent, although evaluation for stenosis is limited by motion. There is no proximal ACA stenosis. ACA branch vessel irregularity is noted bilaterally.  IMPRESSION: 1. Small, acute infarct primarily involving the right corona radiata. 2. Remote infarcts and moderate chronic small vessel ischemic disease as above. 3. Moderately motion degraded MRA without large vessel occlusion. Proximal right M2 severe stenosis versus segmental occlusion.   Electronically Signed   By: Logan Bores   On: 07/30/2014 17:38    Microbiology: Recent Results (from the past 240 hour(s))  URINE CULTURE     Status: None   Collection Time    08/10/14  5:57 PM      Result Value Ref Range Status   Specimen Description URINE, CATHETERIZED   Final   Special Requests NONE   Final   Culture  Setup Time     Final   Value: 08/11/2014 03:28     Performed at [facility]     Final   Value: NO GROWTH     Performed at [facility]   Culture     Final   Value: NO GROWTH     Performed at [facility]   Report Status 08/12/2014 FINAL   Final  CULTURE, BLOOD (ROUTINE X 2)     Status: None   Collection Time    08/14/14 11:35 AM      Result Value Ref Range Status   Specimen Description BLOOD WRIST LEFT   Final   Special Requests BOTTLES DRAWN AEROBIC AND ANAEROBIC 5CC   Final   Culture  Setup Time     Final   Value: 08/14/2014 17:12     Performed at [facility]   Culture     Final   Value:        BLOOD CULTURE RECEIVED NO GROWTH TO DATE CULTURE WILL BE HELD FOR 5 DAYS BEFORE ISSUING A FINAL NEGATIVE REPORT     Performed at [facility]   Report Status PENDING   Incomplete  CULTURE, BLOOD  (ROUTINE X 2)     Status: None   Collection Time    08/14/14 11:36 AM      Result Value Ref Range Status  Specimen Description BLOOD WRIST RIGHT   Final   Special Requests BOTTLES DRAWN AEROBIC AND ANAEROBIC 5CC   Final   Culture  Setup Time     Final   Value: 08/14/2014 17:13     Performed at [facility]   Culture     Final   Value:        BLOOD CULTURE RECEIVED NO GROWTH TO DATE CULTURE WILL BE HELD FOR 5 DAYS BEFORE ISSUING A FINAL NEGATIVE REPORT     Performed at [facility]   Report Status PENDING   Incomplete     Labs: Basic Metabolic Panel:  Recent Labs Lab 08/10/14 1800 08/10/14 1812 08/11/14 0435 08/13/14 0830 08/15/14 0510 08/16/14 0415  NA 141 139 141 143 145 148*  K 4.5 4.3 4.7 4.0 3.6* 3.6*  CL 103 103 105 107 107 111  CO2 28  --  29 25 26 25   GLUCOSE 97 97 112* 133* 105* 119*  BUN 36* 34* 33* 28* 32* 29*  CREATININE 1.08 1.10 1.14 0.90 0.97 0.90  CALCIUM 8.7  --  8.7 8.6 8.7 8.7   Liver Function Tests:  Recent Labs Lab 08/10/14 1800 08/16/14 0415  AST 15 22  ALT 11 15  ALKPHOS 99 83  BILITOT <0.2* 0.3  PROT 6.4 6.2  ALBUMIN 2.5* 2.0*   No results found for this basename: LIPASE, AMYLASE,  in the last 168 hours  Recent Labs Lab 08/10/14 1722  AMMONIA 13   CBC:  Recent Labs Lab 08/10/14 1800 08/10/14 1812 08/11/14 0435 08/13/14 0830 08/15/14 0510 08/16/14 0415  WBC 8.2  --  10.2 11.7* 9.7 9.0  NEUTROABS 6.7  --   --   --   --  8.1*  HGB 8.7* 10.2* 8.7* 8.5* 7.9* 8.1*  HCT 26.9* 30.0* 26.8* 25.8* 23.8* 24.9*  MCV 88.5  --  91.5 89.3 86.2 89.9  PLT 309  --  278 274 PLATELET CLUMPS NOTED ON SMEAR, UNABLE TO ESTIMATE 310   Cardiac Enzymes:  Recent Labs Lab 08/10/14 1850  TROPONINI <0.30   BNP: BNP (last 3 results) No results found for this basename: PROBNP,  in the last 8760 hours CBG:  Recent Labs Lab 08/10/14 1735  GLUCAP 88    Signed:  CHIU, STEPHEN K  [facility] 08/16/2014, 10:35 AM

## 2014-08-19 ENCOUNTER — Non-Acute Institutional Stay (SKILLED_NURSING_FACILITY): Payer: Medicare Other | Admitting: Adult Health

## 2014-08-19 DIAGNOSIS — I4891 Unspecified atrial fibrillation: Secondary | ICD-10-CM

## 2014-08-19 DIAGNOSIS — R5381 Other malaise: Secondary | ICD-10-CM

## 2014-08-19 DIAGNOSIS — R1314 Dysphagia, pharyngoesophageal phase: Secondary | ICD-10-CM

## 2014-08-19 DIAGNOSIS — I482 Chronic atrial fibrillation, unspecified: Secondary | ICD-10-CM

## 2014-08-19 DIAGNOSIS — I69959 Hemiplegia and hemiparesis following unspecified cerebrovascular disease affecting unspecified side: Secondary | ICD-10-CM

## 2014-08-19 DIAGNOSIS — J189 Pneumonia, unspecified organism: Secondary | ICD-10-CM

## 2014-08-19 DIAGNOSIS — I69354 Hemiplegia and hemiparesis following cerebral infarction affecting left non-dominant side: Secondary | ICD-10-CM

## 2014-08-19 DIAGNOSIS — I639 Cerebral infarction, unspecified: Secondary | ICD-10-CM

## 2014-08-19 DIAGNOSIS — I6350 Cerebral infarction due to unspecified occlusion or stenosis of unspecified cerebral artery: Secondary | ICD-10-CM

## 2014-08-20 LAB — CBC AND DIFFERENTIAL
HEMATOCRIT: 25 % — AB (ref 41–53)
Hemoglobin: 7.7 g/dL — AB (ref 13.5–17.5)
Platelets: 359 10*3/uL (ref 150–399)
WBC: 9.4 10^3/mL

## 2014-08-20 LAB — BASIC METABOLIC PANEL
BUN: 41 mg/dL — AB (ref 4–21)
Creatinine: 1 mg/dL (ref 0.6–1.3)
Glucose: 107 mg/dL
Potassium: 3.7 mmol/L (ref 3.4–5.3)
Sodium: 149 mmol/L — AB (ref 137–147)

## 2014-08-20 LAB — CULTURE, BLOOD (ROUTINE X 2)
CULTURE: NO GROWTH
Culture: NO GROWTH

## 2014-08-23 ENCOUNTER — Non-Acute Institutional Stay (SKILLED_NURSING_FACILITY): Payer: Medicare Other | Admitting: Adult Health

## 2014-08-23 DIAGNOSIS — I6350 Cerebral infarction due to unspecified occlusion or stenosis of unspecified cerebral artery: Secondary | ICD-10-CM

## 2014-08-23 DIAGNOSIS — I69354 Hemiplegia and hemiparesis following cerebral infarction affecting left non-dominant side: Secondary | ICD-10-CM

## 2014-08-23 DIAGNOSIS — I69320 Aphasia following cerebral infarction: Secondary | ICD-10-CM

## 2014-08-23 DIAGNOSIS — N179 Acute kidney failure, unspecified: Secondary | ICD-10-CM

## 2014-08-23 DIAGNOSIS — R1314 Dysphagia, pharyngoesophageal phase: Secondary | ICD-10-CM

## 2014-08-23 DIAGNOSIS — I69959 Hemiplegia and hemiparesis following unspecified cerebrovascular disease affecting unspecified side: Secondary | ICD-10-CM

## 2014-08-23 DIAGNOSIS — I69920 Aphasia following unspecified cerebrovascular disease: Secondary | ICD-10-CM

## 2014-08-23 DIAGNOSIS — I639 Cerebral infarction, unspecified: Secondary | ICD-10-CM

## 2014-08-25 ENCOUNTER — Other Ambulatory Visit: Payer: Self-pay

## 2014-08-25 ENCOUNTER — Encounter: Payer: Self-pay | Admitting: Adult Health

## 2014-08-25 DIAGNOSIS — N179 Acute kidney failure, unspecified: Secondary | ICD-10-CM | POA: Insufficient documentation

## 2014-08-25 DIAGNOSIS — I69320 Aphasia following cerebral infarction: Secondary | ICD-10-CM | POA: Insufficient documentation

## 2014-08-25 LAB — BASIC METABOLIC PANEL
Anion Gap: 5 — ABNORMAL LOW (ref 7–16)
BUN: 33 mg/dL — ABNORMAL HIGH (ref 7–18)
CALCIUM: 8.2 mg/dL — AB (ref 8.5–10.1)
CHLORIDE: 118 mmol/L — AB (ref 98–107)
Co2: 29 mmol/L (ref 21–32)
Creatinine: 1.05 mg/dL (ref 0.60–1.30)
EGFR (Non-African Amer.): 49 — ABNORMAL LOW
GFR CALC AF AMER: 56 — AB
Glucose: 110 mg/dL — ABNORMAL HIGH (ref 65–99)
Osmolality: 310 (ref 275–301)
Potassium: 4.1 mmol/L (ref 3.5–5.1)
Sodium: 152 mmol/L — ABNORMAL HIGH (ref 136–145)

## 2014-08-25 NOTE — Progress Notes (Signed)
Patient ID: [name], male   DOB: Mar 25, 1927, 80 y.o.   MRN: [id number]     [facility]  Allergies  Allergen Reactions  . Azithromycin Other (See Comments)    Altered mental status  . Diazepam Other (See Comments)    REACTION: agitation  . Ezetimibe-Simvastatin Other (See Comments)    Unknown, wife thinks it's muscle pain  . Morphine Other (See Comments)    Hallucinations.  . Prednisone Other (See Comments)    Dizziness  . Sulfonamide Derivatives Hives     Chief Complaint  Patient presents with  . Hospitalization Follow-up    HPI:  He has recently had a cva which has left him with left hemiparesis and worsening dysphagia. He has been hospitalized for pneumonia with acute encephalopathy. His wife is at his bedside. He is unable to participate in the hpi or ros. We did have a discussion regarding his prognosis and goals of care at this time; his wife does desire a palliative care consult.    Past Medical History  Diagnosis Date  . Bladder cancer     a. Dr. Lawerance Bach - [facility]. Multiple surgeries, chemo trial.  . Hyperlipidemia   . Anemia 04/10/2009  . Immune thrombocytopenic purpura     a. s/p splenectomy.  Marland Kitchen Anxiety   . Hypertension   . Permanent atrial fibrillation     a. Historically difficult to control rates  . COPD   . TIA (transient ischemic attack)     a. H/o TIA 2007.  . Stroke     a. H/o remote lacunar infarcts, at least 3 of them. b. Right lenticular nucleus and corona radiata infarct, cardioembolic 06/1061. c. Stroke 11/2013, new infarct 2 weeks later 12/2013.  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 Gross hematuria     a. H/o bladder CA with intermittent gross hematuria.  Marland Kitchen Dysphagia   . Anemia     Past Surgical History  Procedure Laterality Date  . Splenectomy    . Bilateral vats ablation    . Facial cancer      facial skin cancer    VITAL SIGNS BP 124/62   Pulse 70  Ht 5\' 10"  (1.778 m)  Wt 123 lb (55.792 kg)  BMI 17.65 kg/m2  SpO2 98%   Patient's Medications  New Prescriptions   No medications on file  Previous Medications   ALBUTEROL (PROVENTIL) (2.5 MG/3ML) 0.083% NEBULIZER SOLUTION    Take 3 mLs (2.5 mg total) by nebulization every 6 (six) hours.   AMIODARONE (PACERONE) 200 MG TABLET    Take 200 mg by mouth 2 (two) times daily. Present dose for 2 weeks then 200 mg daily after 2 weeks. Unknown start date   AMOXICILLIN-CLAVULANATE (AUGMENTIN) 875-125 MG PER TABLET    Take 1 tablet by mouth every 12 (twelve) hours.   APIXABAN (ELIQUIS) 2.5 MG TABS TABLET    Take 2.5 mg by mouth 2 (two) times daily.   ATORVASTATIN (LIPITOR) 40 MG TABLET    Take 1 tablet (40 mg total) by mouth daily at 6 PM.   BACITRACIN-POLYMYXIN B (POLYSPORIN) OINTMENT    Apply 1 application topically every 12 (twelve) hours.   BACITRACIN-POLYMYXIN B (POLYSPORIN) OPHTHALMIC OINTMENT    Place into both eyes every 4 (four) hours. apply to eye every 12 hours while awake   CHOLECALCIFEROL (VITAMIN D) 2000 UNITS TABLET    Take 2,000 Units by mouth  daily.    DIGOXIN (LANOXIN) 0.125 MG TABLET    Take 1 tablet (0.125 mg total) by mouth daily.   DILTIAZEM (CARDIZEM CD) 180 MG 24 HR CAPSULE    Take 1 capsule (180 mg total) by mouth daily.   DOCUSATE SODIUM 100 MG CAPS    Take 200 mg by mouth 2 (two) times daily.   DOXYCYCLINE (VIBRA-TABS) 100 MG TABLET    Take 1 tablet (100 mg total) by mouth every 12 (twelve) hours.   FOOD THICKENER (THICK IT) POWD    Take 1 Container by mouth as needed. HONEY THICK   NITROFURANTOIN, MACROCRYSTAL-MONOHYDRATE, (MACROBID) 100 MG CAPSULE    Take 100 mg by mouth every 12 (twelve) hours.   SACCHAROMYCES BOULARDII (FLORASTOR) 250 MG CAPSULE    Take 1 capsule (250 mg total) by mouth 2 (two) times daily.  Modified Medications   No medications on file  Discontinued Medications   No medications on file    SIGNIFICANT DIAGNOSTIC EXAMS   07-20-14: ct of  head: 1. Stable age related cerebral atrophy, ventriculomegaly and periventricular white matter disease. 2. Remote lacunar-type basal ganglia infarcts an remote left PCA infarct. 3. No acute intracranial findings, mass lesion or skull fracture.  07-20-14: chest x-ray: Underlying emphysema with areas of lung scarring. No edema or consolidation.  07-25-14: mri of head: Atrophy and moderately severe chronic ischemic change. Chronic left occipital infarct as noted on the prior study. Small area of diffusion hyperintensity on the posterior aspect of this infarct. Favor chronic blood products versus a small area of acute infarct posterior to the encephalomalacia.  07-29-14: chest x-ray: minimal basilar subsegmental atelectatic changes but otherwise negative for focal pneumonia. 2. Negative for chf.     LABS REVIEWED:   07-20-14: wbc 8.7; hgb 10.8; hct 32.1; mcv 89.7; plt 217; glucose 99; bun 21; creat 0.91; k+4.2; na++134;liver normal albumin 3.1; dig <0.3; Urine culture: 45,000 units: mixed bacteria 07-24-14; wbc 7.1; hgb 10.0; hct 30.1; mcv 88.5; lt 251; glucose 100 ;bun 22; creat 0.9; k+4.6; na++131  07-26-14: tsh 3.980; free t4: 1.9  07-30-14: wbc 5.4; hgb 9.6; hct 28.3; mcv 87.3; plt 357; glucose 97; bun 28; creat 1.01; k+4.7; na++132; liver normal albumin 2.8; inr 2.13; urine culture: pseudomonas aeruginosa and enterococcus species  07-31-14: wbc 5.2; hgb 9.5; hct 29.3; mcv 91.6; plt 326; glucose 83; bun 22; creat 0.94; k+4.4; na++135; chol 126; ldl 76; trig 40; hgb a1c 5.9      Review of Systems  Unable to perform ROS     Physical Exam  Constitutional: No distress.  frail  Eyes: Conjunctivae are normal. Pupils are equal, round, and reactive to light.  Neck: Neck supple. No JVD present. No thyromegaly present.  Cardiovascular: Normal rate, regular rhythm and intact distal pulses.   Heart rate regular   Respiratory: Effort normal and breath sounds normal. No respiratory distress. He has no  wheezes.  GI: Soft. Bowel sounds are normal. He exhibits no distension. There is no tenderness.  Musculoskeletal: He exhibits no edema.  Is able to move right side extremities; is unable to move left extremities  has generalized weakness present.   Neurological: He is alert.  Skin: Skin is warm and dry. He is not diaphoretic.       ASSESSMENT/ PLAN:   1. Afib: his heart rate is stable; he is presently taking amiodarone 200 mg twice daily through 08-30-14 will then begin amiodarone 200 mg daily. Will continue digoxin 0.125 mg daily; both for rate  control. Will continue cardizem cd 180 mg; will continue eliquis 2.5 mg twice daily and will monitor   2. CVA has left hemiparesis: will continue therapy as directed; will continue eliquis 2.5 mg twice daily and will monitor  3. Constipation: will continue colace 200 mg twice daily   4. Physical deconditioning: will continue therapy as directed to improve upon his strength; gait; mobility and independence with his adl's. His goal remains at this time to return home with family. I am not sure if this is a reasonable goal for him.   5. Dyslipidemia: will continue his lipitor 40 mg daily and will monitor his status.   6. Dysphagia: he is being seen by speech therapy; he will begin honey thick liquids; no signs of aspiration present; will continue to monitor his status.   7. Pneumonia: will complete doxycycline 100 mg twice daily for 7 days; and augmentin 875 mg twice daily for one week and will monitor his status.     His family has request that he not have a feeding tube placed Will setup a palliative care consult Will check cbc and bmp   Time spent with patient 50 minutes.      Ok Edwards NP [facility]  Contact [phone] Monday through Friday 8am- 5pm  After hours call [phone]

## 2014-08-25 NOTE — Progress Notes (Signed)
Patient ID: [name], male   DOB: 16-Aug-1927, 80 y.o.   MRN: [id number]     [facility]  Allergies  Allergen Reactions  . Azithromycin Other (See Comments)    Altered mental status  . Diazepam Other (See Comments)    REACTION: agitation  . Ezetimibe-Simvastatin Other (See Comments)    Unknown, wife thinks it's muscle pain  . Morphine Other (See Comments)    Hallucinations.  . Prednisone Other (See Comments)    Dizziness  . Sulfonamide Derivatives Hives     Chief Complaint  Patient presents with  . Acute Visit    change in status     HPI:  He is not eating or drinking for nearly 24 hours. His family is worried that he may be developing an infection and that is why he is not eating. They feel as though he appears to be in pain. His speech is difficult to understand; however; he does not appear to be in pain. There are no lesions in his mouth. He did tells [place] the honey thickened liquids were "not worth it"; and too "difficult". He denied any pain; he denies hunger or thirst   Past Medical History  Diagnosis Date  . Bladder cancer     a. Dr. Lawerance Bach - [facility]. Multiple surgeries, chemo trial.  . Hyperlipidemia   . Anemia 04/10/2009  . Immune thrombocytopenic purpura     a. s/p splenectomy.  Marland Kitchen Anxiety   . Hypertension   . Permanent atrial fibrillation     a. Historically difficult to control rates  . COPD   . TIA (transient ischemic attack)     a. H/o TIA 2007.  . Stroke     a. H/o remote lacunar infarcts, at least 3 of them. b. Right lenticular nucleus and corona radiata infarct, cardioembolic 02/2439. c. Stroke 11/2013, new infarct 2 weeks later 12/2013.  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 Gross hematuria     a. H/o bladder CA with intermittent gross hematuria.  Marland Kitchen Dysphagia   . Anemia     Past Surgical History  Procedure Laterality Date  . Splenectomy    . Bilateral vats  ablation    . Facial cancer      facial skin cancer    VITAL SIGNS BP 128/70  Pulse 72  Ht 5\' 10"  (1.778 m)  Wt 123 lb (55.792 kg)  BMI 17.65 kg/m2  SpO2 95%   Patient's Medications  New Prescriptions   No medications on file  Previous Medications   ALBUTEROL (PROVENTIL) (2.5 MG/3ML) 0.083% NEBULIZER SOLUTION    Take 3 mLs (2.5 mg total) by nebulization every 6 (six) hours.   AMIODARONE (PACERONE) 200 MG TABLET    Take 200 mg by mouth 2 (two) times daily. Present dose for 2 weeks then 200 mg daily after 2 weeks. Unknown start date   AMOXICILLIN-CLAVULANATE (AUGMENTIN) 875-125 MG PER TABLET    Take 1 tablet by mouth every 12 (twelve) hours.   APIXABAN (ELIQUIS) 2.5 MG TABS TABLET    Take 2.5 mg by mouth 2 (two) times daily.   ATORVASTATIN (LIPITOR) 40 MG TABLET    Take 1 tablet (40 mg total) by mouth daily at 6 PM.   CHOLECALCIFEROL (VITAMIN D) 2000 UNITS TABLET    Take 2,000 Units by mouth daily.    DIGOXIN (LANOXIN) 0.125 MG TABLET    Take 1 tablet (0.125  mg total) by mouth daily.   DILTIAZEM (CARDIZEM CD) 180 MG 24 HR CAPSULE    Take 1 capsule (180 mg total) by mouth daily.   DOCUSATE SODIUM 100 MG CAPS    Take 200 mg by mouth 2 (two) times daily.   DOXYCYCLINE (VIBRA-TABS) 100 MG TABLET    Take 1 tablet (100 mg total) by mouth every 12 (twelve) hours.   FOOD THICKENER (THICK IT) POWD    Take 1 Container by mouth as needed. HONEY THICK   SACCHAROMYCES BOULARDII (FLORASTOR) 250 MG CAPSULE    Take 1 capsule (250 mg total) by mouth 2 (two) times daily.  Modified Medications   No medications on file  Discontinued Medications   No medications on file    SIGNIFICANT DIAGNOSTIC EXAMS   07-20-14: ct of head: 1. Stable age related cerebral atrophy, ventriculomegaly and periventricular white matter disease. 2. Remote lacunar-type basal ganglia infarcts an remote left PCA infarct. 3. No acute intracranial findings, mass lesion or skull fracture.  07-20-14: chest x-ray: Underlying  emphysema with areas of lung scarring. No edema or consolidation.  07-25-14: mri of head: Atrophy and moderately severe chronic ischemic change. Chronic left occipital infarct as noted on the prior study. Small area of diffusion hyperintensity on the posterior aspect of this infarct. Favor chronic blood products versus a small area of acute infarct posterior to the encephalomalacia.  07-29-14: chest x-ray: minimal basilar subsegmental atelectatic changes but otherwise negative for focal pneumonia. 2. Negative for chf.     LABS REVIEWED:   07-20-14: wbc 8.7; hgb 10.8; hct 32.1; mcv 89.7; plt 217; glucose 99; bun 21; creat 0.91; k+4.2; na++134;liver normal albumin 3.1; dig <0.3; Urine culture: 45,000 units: mixed bacteria 07-24-14; wbc 7.1; hgb 10.0; hct 30.1; mcv 88.5; lt 251; glucose 100 ;bun 22; creat 0.9; k+4.6; na++131  07-26-14: tsh 3.980; free t4: 1.9  07-30-14: wbc 5.4; hgb 9.6; hct 28.3; mcv 87.3; plt 357; glucose 97; bun 28; creat 1.01; k+4.7; na++132; liver normal albumin 2.8; inr 2.13; urine culture: pseudomonas aeruginosa and enterococcus species  07-31-14: wbc 5.2; hgb 9.5; hct 29.3; mcv 91.6; plt 326; glucose 83; bun 22; creat 0.94; k+4.4; na++135; chol 126; ldl 76; trig 40; hgb a1c 5.9  08-20-14: wbc 9.4; hgb 7.7; hct 24.7; mcv 91.1;plt 359; glucose 107; bun 41; creat 1.0; k+3.7; na++149     Review of Systems  Unable to perform ROS     Physical Exam  Constitutional: No distress.  frail  Eyes: Conjunctivae are normal. Pupils are equal, round, and reactive to light.  Neck: Neck supple. No JVD present. No thyromegaly present. no oral lesions  Cardiovascular: Normal rate, regular rhythm and intact distal pulses.   Heart rate regular   Respiratory: Effort normal and breath sounds normal. No respiratory distress. He has no wheezes.  GI: Soft. Bowel sounds are normal. He exhibits no distension. There is no tenderness.  Musculoskeletal: He exhibits no edema.  Is able to move right side  extremities; is unable to move left extremities  has generalized weakness present.   Neurological: He is alert. speech is very difficult to understand Skin: Skin is warm and dry. He is not diaphoretic.      ASSESSMENT/ PLAN:  1. cva 2. Dysphagia 3. Aphasia 4. Acute renal failure  We have spent a great deal of time discussing his treatment options. His family is aware that his overall status is declining. They would like to attempt one more round of ivf with the hope  that he will be able to eat and drink on his own after completing them. He does not want a tube feeding.  1. Will begin D5 NS 75 cc hour for 2 liters 2. Ua/c&s  3. On 08-26-14 ;check cbc; cmp   Time spent with patient 60 minutes.      Ok Edwards NP [facility]  Contact [phone] Monday through Friday 8am- 5pm  After hours call [phone]

## 2014-08-26 ENCOUNTER — Other Ambulatory Visit: Payer: Self-pay | Admitting: *Deleted

## 2014-08-26 ENCOUNTER — Non-Acute Institutional Stay (SKILLED_NURSING_FACILITY): Payer: Medicare Other | Admitting: Internal Medicine

## 2014-08-26 DIAGNOSIS — R0609 Other forms of dyspnea: Secondary | ICD-10-CM

## 2014-08-26 DIAGNOSIS — R627 Adult failure to thrive: Secondary | ICD-10-CM

## 2014-08-26 DIAGNOSIS — R0989 Other specified symptoms and signs involving the circulatory and respiratory systems: Secondary | ICD-10-CM

## 2014-08-26 DIAGNOSIS — R0600 Dyspnea, unspecified: Secondary | ICD-10-CM

## 2014-08-26 DIAGNOSIS — R1314 Dysphagia, pharyngoesophageal phase: Secondary | ICD-10-CM

## 2014-08-26 DIAGNOSIS — I4891 Unspecified atrial fibrillation: Secondary | ICD-10-CM

## 2014-08-26 DIAGNOSIS — R5381 Other malaise: Secondary | ICD-10-CM

## 2014-08-26 DIAGNOSIS — I482 Chronic atrial fibrillation, unspecified: Secondary | ICD-10-CM

## 2014-08-26 DIAGNOSIS — J189 Pneumonia, unspecified organism: Secondary | ICD-10-CM

## 2014-08-26 MED ORDER — AMBULATORY NON FORMULARY MEDICATION: Status: AC

## 2014-08-26 NOTE — Progress Notes (Signed)
Patient ID: [name], male   DOB: 10-03-1927, 80 y.o.   MRN: [id number]   Facility: [facility]   PCP: Cathlean Cower, MD  Code Status: DNR  Allergies  Allergen Reactions  . Azithromycin Other (See Comments)    Altered mental status  . Diazepam Other (See Comments)    REACTION: agitation  . Ezetimibe-Simvastatin Other (See Comments)    Unknown, wife thinks it's muscle pain  . Morphine Other (See Comments)    Hallucinations.  . Prednisone Other (See Comments)    Dizziness  . Sulfonamide Derivatives Hives    Chief Complaint: admission visit  HPI:  80 y/o male patient is here for STR after hospital admission from 08/10/14-08/16/14 with acute encephalopathy in setting of uti and pneumonia. He has history of bladder neoplasm, HTN, afib, cva, COPD, hyperlipidemia. He is seen in his room with his wife and son present. He is minimally responsive to commands and is lethargic. He is using his neck and abdominal muscles to breath and is frail. He is able to answer simple questions with yes and no. He has had difficulty swallowing, seen by SLP team and has not been able to swallow any food or medications. He was started on iv fluid yesterday with him not taking anything po.   Review of Systems:  Constitutional: Negative for fever HENT: Negative for congestion.   Respiratory: positive for cough. Appears SOB Cardiovascular: Negative for chest pain.  Gastrointestinal: Negative for heartburn, nausea, vomiting, abdominal pain  Musculoskeletal: Negative for pain Skin: Negative for itching and rash.  Neurological: positive for weakness  Past Medical History  Diagnosis Date  . Bladder cancer     a. Dr. Lawerance Bach - [facility]. Multiple surgeries, chemo trial.  . Hyperlipidemia   . Anemia 04/10/2009  . Immune thrombocytopenic purpura     a. s/p splenectomy.  Marland Kitchen Anxiety   . Hypertension   . Permanent atrial fibrillation     a. Historically difficult to control rates  .  COPD   . TIA (transient ischemic attack)     a. H/o TIA 2007.  . Stroke     a. H/o remote lacunar infarcts, at least 3 of them. b. Right lenticular nucleus and corona radiata infarct, cardioembolic 07/6766. c. Stroke 11/2013, new infarct 2 weeks later 12/2013.  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 Gross hematuria     a. H/o bladder CA with intermittent gross hematuria.  Marland Kitchen Dysphagia   . Anemia    Past Surgical History  Procedure Laterality Date  . Splenectomy    . Bilateral vats ablation    . Facial cancer      facial skin cancer   Social History:   reports that he has never smoked. He has never used smokeless tobacco. He reports that he does not drink alcohol or use illicit drugs.  Family History  Problem Relation Age of Onset  . Heart disease Mother   . Arthritis Father     Medications: Patient's Medications  New Prescriptions   AMBULATORY NON FORMULARY MEDICATION    Morphine Sul Sol 100/64ml Sig: Take 0.78ml by mouth every 6 hours as needed for pain/shorness of breath  Previous Medications   ALBUTEROL (PROVENTIL) (2.5 MG/3ML) 0.083% NEBULIZER SOLUTION    Take 3 mLs (2.5 mg total) by nebulization every 6 (six) hours.   AMIODARONE (PACERONE) 200 MG TABLET    Take 200  mg by mouth 2 (two) times daily. Present dose for 2 weeks then 200 mg daily after 2 weeks. Unknown start date   AMOXICILLIN-CLAVULANATE (AUGMENTIN) 875-125 MG PER TABLET    Take 1 tablet by mouth every 12 (twelve) hours.   APIXABAN (ELIQUIS) 2.5 MG TABS TABLET    Take 2.5 mg by mouth 2 (two) times daily.   ATORVASTATIN (LIPITOR) 40 MG TABLET    Take 1 tablet (40 mg total) by mouth daily at 6 PM.   CHOLECALCIFEROL (VITAMIN D) 2000 UNITS TABLET    Take 2,000 Units by mouth daily.    DIGOXIN (LANOXIN) 0.125 MG TABLET    Take 1 tablet (0.125 mg total) by mouth daily.   DILTIAZEM (CARDIZEM CD) 180 MG 24 HR CAPSULE    Take 1 capsule  (180 mg total) by mouth daily.   DOCUSATE SODIUM 100 MG CAPS    Take 200 mg by mouth 2 (two) times daily.   DOXYCYCLINE (VIBRA-TABS) 100 MG TABLET    Take 1 tablet (100 mg total) by mouth every 12 (twelve) hours.   FOOD THICKENER (THICK IT) POWD    Take 1 Container by mouth as needed. HONEY THICK   SACCHAROMYCES BOULARDII (FLORASTOR) 250 MG CAPSULE    Take 1 capsule (250 mg total) by mouth 2 (two) times daily.  Modified Medications   No medications on file  Discontinued Medications   No medications on file   Physical Exam: bp 148/69, HR 90/min, rr 22/min, temp 98, o2 sat 95% room air  General- elderly frail male, ill appearing Head- atraumatic, normocephalic Eyes- PERRLA, EOMI, no pallor, no icterus, no discharge Neck- no lymphadenopathy Throat- dry mucus membrane Cardiovascular- irregular heart rate Respiratory- bilateral poor air entry, no wheeze or crackles, using neck muscles to breathe Abdomen- bowel sounds present, soft, non tender Musculoskeletal- able to move right side, unable to move left side strength on left 0/5 Neurological- non verbal Skin- warm and dry  Labs reviewed: Basic Metabolic Panel:  Recent Labs  01/06/14 0355 01/07/14 0445  07/26/14 0700  08/13/14 0830 08/15/14 0510 08/16/14 0415 08/20/14  NA 137 136*  < >  --   < > 143 145 148* 149*  K 3.6* 3.8  < >  --   < > 4.0 3.6* 3.6* 3.7  CL 98 98  < >  --   < > 107 107 111  --   CO2 29 27  < >  --   < > 25 26 25   --   GLUCOSE 86 90  < >  --   < > 133* 105* 119*  --   BUN 17 11  < >  --   < > 28* 32* 29* 41*  CREATININE 0.82 0.77  < >  --   < > 0.90 0.97 0.90 1.0  CALCIUM 8.3* 8.4  < >  --   < > 8.6 8.7 8.7  --   MG  --  1.9  --  1.9  --   --   --   --   --   < > = values in this interval not displayed. Liver Function Tests:  Recent Labs  07/30/14 0954 08/10/14 1800 08/16/14 0415  AST 20 15 22   ALT 15 11 15   ALKPHOS 95 99 83  BILITOT 0.2* <0.2* 0.3  PROT 6.8 6.4 6.2  ALBUMIN 2.8* 2.5* 2.0*     Recent Labs  07/20/14 1132  LIPASE 22    Recent Labs  08/10/14 1722  AMMONIA 13   CBC:  Recent Labs  07/30/14 0954  08/10/14 1800  08/13/14 0830 08/15/14 0510 08/16/14 0415 08/20/14  WBC 5.4  < > 8.2  < > 11.7* 9.7 9.0 9.4  NEUTROABS 4.0  --  6.7  --   --   --  8.1*  --   HGB 9.6*  < > 8.7*  < > 8.5* 7.9* 8.1* 7.7*  HCT 28.3*  < > 26.9*  < > 25.8* 23.8* 24.9* 25*  MCV 87.3  < > 88.5  < > 89.3 86.2 89.9  --   PLT 357  < > 309  < > 274 PLATELET CLUMPS NOTED ON SMEAR, UNABLE TO ESTIMATE 310 359  < > = values in this interval not displayed. Cardiac Enzymes:  Recent Labs  07/21/14 0022 07/21/14 0615 08/10/14 1850  TROPONINI <0.30 <0.30 <0.30   BNP: No components found with this basename: POCBNP,  CBG:  Recent Labs  08/05/14 1135 08/05/14 1623 08/10/14 1735  GLUCAP 131* 111* 88    Radiological Exams: Dg Chest 2 View  07/30/2014   CLINICAL DATA:  Acute stroke.  EXAM: CHEST  2 VIEW  COMPARISON:  07/20/2014 and 01/06/2014  FINDINGS: There is severe emphysema. There is benign calcification in the right midzone, stable. Heart size and vascularity are normal. No acute osseous abnormalities. Diffuse osteopenia. Chronic by a lateral apical pleural thickening, left greater than right.  IMPRESSION: No acute abnormalities.  Severe emphysema.   Electronically Signed   By: Rozetta Nunnery M.D.   On: 07/30/2014 17:21   Ct Head Wo Contrast  08/10/2014   CLINICAL DATA:  Altered consciousness.  Lethargy.  EXAM: CT HEAD WITHOUT CONTRAST  TECHNIQUE: Contiguous axial images were obtained from the base of the skull through the vertex without intravenous contrast.  COMPARISON:  07/30/2014 and MRI of 07/30/2014  FINDINGS: Sinuses/Soft tissues: Cerumen in the right external ear canal. Clear paranasal sinuses and mastoid air cells.  Intracranial: Advanced cerebral atrophy. Moderate to marked low density in the periventricular white matter likely related to small vessel disease.  Hypoattenuation in the right corona radiata on image 17 is like due to evolving subacute to chronic infarct. Scattered remote lacunar infarcts in the basal ganglia. Remote left occipital lobe cortical based infarct.  No hemorrhage, hydrocephalus, intra-axial, or extra-axial fluid collection.  IMPRESSION: 1.  No acute intracranial abnormality. 2.  Cerebral atrophy and small vessel ischemic change. 3. Subacute to chronic right corona radiata infarct, as detailed on prior MRI.   Electronically Signed   By: Abigail Miyamoto M.D.   On: 08/10/2014 18:26   Ct Head Wo Contrast  07/30/2014   CLINICAL DATA:  Slurred speech  EXAM: CT HEAD WITHOUT CONTRAST  TECHNIQUE: Contiguous axial images were obtained from the base of the skull through the vertex without intravenous contrast.  COMPARISON:  07/25/2014  FINDINGS: The bony calvarium is intact. Atrophic changes are noted. A stable left occipital infarct is noted when compared with the prior exam. Scattered lacunar infarcts are noted within the deep white matter bilaterally and also stable. Chronic white matter ischemic changes noted. No acute hemorrhage, acute infarction or space-occupying mass lesion is identified.  IMPRESSION: Chronic changes stable from recent MRI examination. No acute abnormality is noted.   Electronically Signed   By: Inez Catalina M.D.   On: 07/30/2014 10:00   Ct Head Wo Contrast  07/20/2014   CLINICAL DATA:  Generalized weakness and mental status change.  EXAM: CT HEAD  WITHOUT CONTRAST  TECHNIQUE: Contiguous axial images were obtained from the base of the skull through the vertex without intravenous contrast.  COMPARISON:  01/05/2014.  FINDINGS: Stable age related cerebral atrophy, ventriculomegaly, periventricular white matter disease and remote lacunar-type basal ganglia infarcts. Evolutionary change in the left PCA infarct noted on the prior MRI with encephalomalacia. No definite acute infarct and no intracranial hemorrhage, mass lesion or extra-axial  fluid collection.  The bony structures are intact. The paranasal sinuses and mastoid air cells are grossly clear. The globes are intact.  IMPRESSION: 1. Stable age related cerebral atrophy, ventriculomegaly and periventricular white matter disease. 2. Remote lacunar-type basal ganglia infarcts an remote left PCA infarct. 3. No acute intracranial findings, mass lesion or skull fracture.   Electronically Signed   By: Kalman Jewels M.D.   On: 07/20/2014 13:23   Mr Brain Wo Contrast  07/30/2014   CLINICAL DATA:  New left-sided weakness. Straight. History of multiple prior strokes. Atrial fibrillation.  EXAM: MRI HEAD WITHOUT CONTRAST  MRA HEAD WITHOUT CONTRAST  TECHNIQUE: Multiplanar, multiecho pulse sequences of the brain and surrounding structures were obtained without intravenous contrast. Angiographic images of the head were obtained using MRA technique without contrast.  COMPARISON:  Head CT 07/30/2014. Head MRI 07/25/2014. Head MRA 08/17/2012.  FINDINGS: MRI HEAD FINDINGS  Images are mildly degraded by motion.  There is a small, acute infarct centered in the posterior right corona radiata with a small amount of extension into the posterior aspects of the external and internal capsules. T2 hyperintensities in the periventricular white matter and pons are similar to the prior MRI and nonspecific but compatible with moderate chronic small vessel ischemic disease. A remote left occipital lobe infarct is again seen. Small, remote infarcts are again seen in the bilateral corona radiata, basal ganglia, thalami, and cerebellum. There is moderate cerebral atrophy. There is no evidence of intracranial hemorrhage, mass, midline shift, or extra-axial fluid collection.  Orbits are unremarkable. Paranasal sinuses are clear. There is a small right mastoid effusion. Major intracranial vascular flow voids are preserved.  MRA HEAD FINDINGS  Images are moderately degraded by motion.  Visualized distal vertebral arteries are  patent with the left being dominant. PICA origins are patent. SCA origins are patent. Mild irregularity is noted of the basilar artery without significant stenosis. There are patent bilateral posterior communicating arteries, with a fetal type origin of the left PCA. There is no evidence of high-grade proximal PCA stenosis. Moderate bilateral PCA branch vessel irregularity is noted.  Mild ectasia is again noted at the distal cervical left ICA. Internal carotid arteries are patent from skullbase to carotid termini and again demonstrate mild ectasia. Bilateral M1 segments are patent without evidence of high-grade stenosis. MCA bifurcations and proximal M2 segments are poorly evaluated due to motion, particularly on the right. There is diminished flow related enhancement proximally in the right M2 superior division, suggestive of either severe stenosis or segmental occlusion, with some diminished flow present in more distal right MCA branches compared to the left. Left M2 branches appear grossly patent, although evaluation for stenosis is limited by motion. There is no proximal ACA stenosis. ACA branch vessel irregularity is noted bilaterally.  IMPRESSION: 1. Small, acute infarct primarily involving the right corona radiata. 2. Remote infarcts and moderate chronic small vessel ischemic disease as above. 3. Moderately motion degraded MRA without large vessel occlusion. Proximal right M2 severe stenosis versus segmental occlusion.   Electronically Signed   By: Logan Bores   On: 07/30/2014  17:38   Mr Brain Wo Contrast  07/25/2014   CLINICAL DATA:  Stroke  EXAM: MRI HEAD WITHOUT CONTRAST  TECHNIQUE: Multiplanar, multiecho pulse sequences of the brain and surrounding structures were obtained without intravenous contrast.  COMPARISON:  MRI 01/06/2014 FINDINGS: Generalized atrophy. Multiple areas of chronic infarction including the cerebral white matter bilaterally, and the basal ganglia bilaterally. Chronic infarct left  occipital lobe which contains an area of hyperintensity on diffusion-weighted imaging. Favor chronic blood products although small area of acute infarct is possible. Correlate with any acute visual changes. No other areas of acute infarct.  Negative for mass lesion.  No shift of the midline structures.  Paranasal sinuses are clear.  IMPRESSION: Atrophy and moderately severe chronic ischemic change.  Chronic left occipital infarct as noted on the prior study. Small area of diffusion hyperintensity on the posterior aspect of this infarct. Favor chronic blood products versus a small area of acute infarct posterior to the encephalomalacia.   Electronically Signed   By: Franchot Gallo M.D.   On: 07/25/2014 10:09   Dg Chest Port 1 View  08/14/2014   CLINICAL DATA:  Fever, pneumonia.  EXAM: PORTABLE CHEST - 1 VIEW  COMPARISON:  August 10, 2014.  FINDINGS: Stable cardiomediastinal silhouette. Significantly increased right lower lobe airspace opacity is noted most consistent with pneumonia. Minimal right pleural effusion is noted. No pneumothorax is noted. Increase left lower lobe opacity is also noted concerning for possible pneumonia.  IMPRESSION: Significantly increased right lower lobe airspace opacity is noted concerning for pneumonia. Mild left lower lobe opacity is also noted concerning for pneumonia.   Electronically Signed   By: Sabino Dick M.D.   On: 08/14/2014 10:44   Dg Chest Portable 1 View  08/10/2014   CLINICAL DATA:  Altered mental status.  Weakness.  EXAM: PORTABLE CHEST - 1 VIEW  COMPARISON:  08/02/2014  FINDINGS: Hyperinflation. Numerous leads and wires project over the chest. Midline trachea. Moderate cardiomegaly with atherosclerosis in the transverse aorta. Trace right pleural fluid or thickening blunts the costophrenic angle. There is also left greater than right pleural parenchymal scarring at the apices.  No congestive failure. Patchy medial right lung base airspace disease.  IMPRESSION:  Medial right lung base airspace disease, suspicious for infection or aspiration.  Cardiomegaly and hyperinflation, without congestive failure.  Trace right pleural fluid or thickening.   Electronically Signed   By: Abigail Miyamoto M.D.   On: 08/10/2014 17:55   Dg Chest Port 1 View  08/02/2014   CLINICAL DATA:  Fever  EXAM: PORTABLE CHEST - 1 VIEW  COMPARISON:  07/30/2014  FINDINGS: Diffuse interstitial edema and vascular congestion have developed. Mild cardiomegaly. No pneumothorax. No pleural effusion. Chronic pleural changes.  IMPRESSION: New development of interstitial edema.   Electronically Signed   By: Maryclare Bean M.D.   On: 08/02/2014 16:25   Dg Chest Portable 1 View  07/20/2014   CLINICAL DATA:  Chest pain  EXAM: PORTABLE CHEST - 1 VIEW  COMPARISON:  January 06, 2014  FINDINGS: There is underlying emphysematous change. There are areas of mild scarring bilaterally. There is no frank edema or consolidation. Heart is upper normal in size with pulmonary vascularity reflecting the underlying emphysematous change. There is stable apical pleural thickening bilaterally, slightly more on the left than on the right. No adenopathy. There is atherosclerotic change in the aorta.  IMPRESSION: Underlying emphysema with areas of lung scarring. No edema or consolidation.   Electronically Signed   By: Gwyndolyn Saxon  Jasmine December M.D.   On: 07/20/2014 12:40   Dg Swallowing Func-speech Pathology  07/31/2014   Loletha Grayer, [place]     07/31/2014 12:17 PM Objective Swallowing Evaluation: Modified Barium Swallowing Study   Patient Details  Name: [name] MRN: [id number] Date of Birth: 1927/06/06  Today's Date: 07/31/2014 Time: 3267-1245 SLP Time Calculation (min): 26 min  Past Medical History:  Past Medical History  Diagnosis Date  . Bladder cancer     a. Dr. Lawerance Bach - [facility]. Multiple surgeries, chemo trial.  . Hyperlipidemia   . Anemia 04/10/2009  . Immune thrombocytopenic purpura     a. s/p splenectomy.  Marland Kitchen Anxiety   .  Hypertension   . Permanent atrial fibrillation     a. Historically difficult to control rates  . COPD   . TIA (transient ischemic attack)     a. H/o TIA 2007.  . Stroke     a. H/o remote lacunar infarcts, at least 3 of them. b. Right  lenticular nucleus and corona radiata infarct, cardioembolic  07/997. c. Stroke 11/2013, new infarct 2 weeks later 12/2013.  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 Gross hematuria     a. H/o bladder CA with intermittent gross hematuria.  Marland Kitchen Dysphagia   . Anemia    Past Surgical History:  Past Surgical History  Procedure Laterality Date  . Splenectomy    . Bilateral vats ablation    . Facial cancer      facial skin cancer   HPI:  [name] is a 80 y.o. male, with H/O multiple bilateral CVAs,  at baseline left side stronger than right, atrial fibrillation on  xaralto, hypertension, COPD, UTI, chronic intermittent hematuria,  who was recently discharged to a nursing home from this facility  after a UTI related admission, Pt was admitted from [facility] 07/30/14  with slurred speech and new onset dense left-sided weakness. MRI  revealed a small, acute infarct primarily involving the right  corona radiata.     Assessment / Plan / Recommendation Clinical Impression  Dysphagia Diagnosis: Moderate oral phase dysphagia;Severe oral  phase dysphagia;Moderate pharyngeal phase dysphagia;Severe  pharyngeal phase dysphagia  Clinical Impression:   Pt presents with a moderate-severe  oropharyngeal dysphagia that is sensorimotor based. Pt's oral  phase is characterized by weak and discoordinated manipulation  with lingual pumping and decreased bolus cohesion, leading to  both anterior and posterior loss. Pt exhibited very delayed  mastication with soft solid with residuals remaining in oral  cavity. Unfortunately, all liquid consistencies tested resulted  in silent aspiration or deep penetration to the vocal folds,  which  occurred before the swallow with prematurely spilled  material. Solid PO was not observed to enter the airway, although  pharyngeal weakness led to mild-moderate diffuse residue,  particularly at the valleculae. Recommend to initiate Dys 1  textures and pudding thick liquids with pharyngeal strengthening  exercises to maximize strength and efficiency.    Treatment Recommendation  Therapy as outlined in treatment plan below    Diet Recommendation Dysphagia 1 (Puree);Pudding-thick liquid   Liquid Administration via: Spoon Medication Administration: Crushed with puree Supervision: Patient able to self feed;Staff to assist with self  feeding;Full supervision/cueing for compensatory strategies Compensations: Slow rate;Small sips/bites;Check for  pocketing;Check for anterior loss;Multiple dry swallows after  each bite/sip Postural Changes and/or Swallow Maneuvers: Seated upright 90  degrees;Upright 30-60 min after meal  Other  Recommendations Recommended Consults: MBS Oral Care Recommendations: Oral care BID Other Recommendations: Order thickener from pharmacy;Prohibited  food (jello, ice cream, thin soups);Remove water pitcher;Have  oral suction available   Follow Up Recommendations  Inpatient Rehab;24 hour supervision/assistance    Frequency and Duration min 2x/week  2 weeks   Pertinent Vitals/Pain n/a    SLP Swallow Goals     General Date of Onset: 07/30/14 HPI: [name] is a 80 y.o. male, with H/O multiple bilateral  CVAs, at baseline left side stronger than right, atrial  fibrillation on xaralto, hypertension, COPD, UTI, chronic  intermittent hematuria, who was recently discharged to a nursing  home from this facility after a UTI related admission, Pt was  admitted from [facility] 07/30/14 with slurred speech and new onset dense  left-sided weakness. MRI revealed a small, acute infarct  primarily involving the right corona radiata. Type of Study: Modified Barium Swallowing Study Reason for Referral: Objectively evaluate  swallowing function Previous Swallow Assessment: most recent MBS 12/2013 recommending  Dys 1 and honey. Pt discharged from that admission on Dys 3 and  nectar with the water protocol. Diet Prior to this Study: NPO Temperature Spikes Noted: No Respiratory Status: Room air History of Recent Intubation: No Behavior/Cognition: Alert;Cooperative;Pleasant mood;Requires  cueing Oral Cavity - Dentition: Adequate natural dentition Oral Motor / Sensory Function: Impaired - see Bedside swallow  eval Self-Feeding Abilities: Needs assist Patient Positioning: Upright in chair Baseline Vocal Quality: Low vocal intensity Volitional Cough: Weak Volitional Swallow: Able to elicit Anatomy: Within functional limits Pharyngeal Secretions: Not observed secondary MBS   Reason for Referral Objectively evaluate swallowing function   Oral Phase Oral Preparation/Oral Phase Oral Phase: Impaired Oral - Honey Oral - Honey Teaspoon: Left anterior bolus loss;Weak lingual  manipulation;Lingual pumping;Reduced posterior propulsion;Delayed  oral transit Oral - Honey Cup: Left anterior bolus loss;Weak lingual  manipulation;Lingual pumping;Reduced posterior propulsion;Delayed  oral transit Oral - Nectar Oral - Nectar Teaspoon: Left anterior bolus loss;Weak lingual  manipulation;Lingual pumping;Reduced posterior propulsion;Delayed  oral transit Oral - Nectar Cup: Left anterior bolus loss;Weak lingual  manipulation;Lingual pumping;Reduced posterior propulsion;Delayed  oral transit Oral - Solids Oral - Puree: Left anterior bolus loss;Weak lingual  manipulation;Lingual pumping;Reduced posterior propulsion;Delayed  oral transit Oral - Mechanical Soft: Left anterior bolus loss;Weak lingual  manipulation;Lingual pumping;Reduced posterior propulsion;Delayed  oral transit;Impaired mastication;Lingual/palatal residue   Pharyngeal Phase Pharyngeal Phase Pharyngeal Phase: Impaired Pharyngeal - Honey Pharyngeal - Honey Teaspoon: Premature spillage to pyriform   sinuses;Reduced anterior laryngeal mobility;Reduced laryngeal  elevation;Reduced tongue base retraction;Penetration/Aspiration  before swallow;Reduced pharyngeal peristalsis;Pharyngeal residue  - valleculae;Pharyngeal residue - pyriform sinuses;Pharyngeal  residue - posterior pharnyx Penetration/Aspiration details (honey teaspoon): Material enters  airway, passes BELOW cords without attempt by patient to eject  out (silent aspiration) Pharyngeal - Honey Cup: Premature spillage to pyriform  sinuses;Reduced anterior laryngeal mobility;Reduced laryngeal  elevation;Reduced tongue base retraction;Penetration/Aspiration  before swallow;Reduced pharyngeal peristalsis;Pharyngeal residue  - valleculae;Pharyngeal residue - pyriform sinuses;Pharyngeal  residue - posterior pharnyx Penetration/Aspiration details (honey cup): Material enters  airway, passes BELOW cords without attempt by patient to eject  out (silent aspiration) Pharyngeal - Nectar Pharyngeal - Nectar Teaspoon: Premature spillage to pyriform  sinuses;Reduced anterior laryngeal mobility;Reduced laryngeal  elevation;Reduced tongue base retraction;Penetration/Aspiration  before swallow;Reduced pharyngeal peristalsis;Pharyngeal residue  - valleculae;Pharyngeal residue - pyriform sinuses;Pharyngeal  residue - posterior pharnyx Penetration/Aspiration details (nectar teaspoon): Material enters  airway, CONTACTS cords and not ejected out Pharyngeal - Nectar Cup: Premature spillage to pyriform  sinuses;Reduced anterior laryngeal mobility;Reduced laryngeal  elevation;Reduced tongue base retraction;Penetration/Aspiration  before swallow;Reduced pharyngeal peristalsis;Pharyngeal residue  - valleculae;Pharyngeal residue - pyriform sinuses;Pharyngeal  residue - posterior pharnyx Penetration/Aspiration details (nectar cup): Material enters  airway, passes BELOW cords without attempt by patient to eject  out (silent aspiration) Pharyngeal - Solids Pharyngeal - Puree: Reduced  anterior laryngeal mobility;Reduced  laryngeal elevation;Reduced tongue base retraction;Reduced  pharyngeal peristalsis;Pharyngeal residue - valleculae;Pharyngeal  residue - pyriform sinuses;Pharyngeal residue - posterior  pharnyx;Premature spillage to valleculae Penetration/Aspiration details (puree): Material does not enter  airway Pharyngeal - Mechanical Soft: Reduced anterior laryngeal  mobility;Reduced laryngeal elevation;Reduced tongue base  retraction;Reduced pharyngeal peristalsis;Pharyngeal residue -  valleculae;Pharyngeal residue - pyriform sinuses;Pharyngeal  residue - posterior pharnyx;Premature spillage to valleculae Penetration/Aspiration details (mechanical soft): Material does  not enter airway  Cervical Esophageal Phase    GO    Cervical Esophageal Phase Cervical Esophageal Phase: [facility]        Germain Osgood, M.A. CCC-SLP [phone]  Germain Osgood 07/31/2014, 12:13 PM    Mr [name] Head/brain Wo Cm  07/30/2014   CLINICAL DATA:  New left-sided weakness. Straight. History of multiple prior strokes. Atrial fibrillation.  EXAM: MRI HEAD WITHOUT CONTRAST  MRA HEAD WITHOUT CONTRAST  TECHNIQUE: Multiplanar, multiecho pulse sequences of the brain and surrounding structures were obtained without intravenous contrast. Angiographic images of the head were obtained using MRA technique without contrast.  COMPARISON:  Head CT 07/30/2014. Head MRI 07/25/2014. Head MRA 08/17/2012.  FINDINGS: MRI HEAD FINDINGS  Images are mildly degraded by motion.  There is a small, acute infarct centered in the posterior right corona radiata with a small amount of extension into the posterior aspects of the external and internal capsules. T2 hyperintensities in the periventricular white matter and pons are similar to the prior MRI and nonspecific but compatible with moderate chronic small vessel ischemic disease. A remote left occipital lobe infarct is again seen. Small, remote infarcts are again seen in the bilateral corona  radiata, basal ganglia, thalami, and cerebellum. There is moderate cerebral atrophy. There is no evidence of intracranial hemorrhage, mass, midline shift, or extra-axial fluid collection.  Orbits are unremarkable. Paranasal sinuses are clear. There is a small right mastoid effusion. Major intracranial vascular flow voids are preserved.  MRA HEAD FINDINGS  Images are moderately degraded by motion.  Visualized distal vertebral arteries are patent with the left being dominant. PICA origins are patent. SCA origins are patent. Mild irregularity is noted of the basilar artery without significant stenosis. There are patent bilateral posterior communicating arteries, with a fetal type origin of the left PCA. There is no evidence of high-grade proximal PCA stenosis. Moderate bilateral PCA branch vessel irregularity is noted.  Mild ectasia is again noted at the distal cervical left ICA. Internal carotid arteries are patent from skullbase to carotid termini and again demonstrate mild ectasia. Bilateral M1 segments are patent without evidence of high-grade stenosis. MCA bifurcations and proximal M2 segments are poorly evaluated due to motion, particularly on the right. There is diminished flow related enhancement proximally in the right M2 superior division, suggestive of either severe stenosis or segmental occlusion, with some diminished flow present in more distal right MCA branches compared to the left. Left M2 branches appear grossly patent, although evaluation for stenosis is limited by motion. There is no proximal ACA stenosis. ACA branch vessel irregularity is noted bilaterally.  IMPRESSION: 1. Small, acute infarct primarily involving the right corona radiata. 2. Remote infarcts and moderate chronic small vessel ischemic disease as above. 3. Moderately motion degraded MRA  without large vessel occlusion. Proximal right M2 severe stenosis versus segmental occlusion.   Electronically Signed   By: Logan Bores   On:  07/30/2014 17:38    Assessment/Plan  Deconditioning Patient is rapidly declining and with his medical co-morbidities, needs to be made comfort care. Family agrees with this care plan. Will need to stop all his medications (family wants this to be continued until seen by palliative team though pt unable to take any at present). Continue iv fluids until seen by palliative care team. Goal of care is to take him home with hospice. Will need to try to arrange for this as patient would prefer this.   penumonia Has completed days of antibiotics but most of recent doses missed with his dysphagia worsening. Will d/c both antibiotics today. D/c augmentin and doxycycline  Dyspnea His recent pneumonia, poor lung reserve could be contributing to this. o2 by nasal canula. Continue duoneb and will have him on morphine 20 mg/ml 0.25 ml q6h prn for now to help with the breathing  Failure to thrive On iv fluid d5 half NS for now. Pt does not want feeding tube. Unable to take anything po at present. The condition is going to rapidly decline further. Have spoken in length with patient's wife and son (very supportive family). Family wants to move towards comfort care for him. They would like for him to go home with hospice care if possible. Staff spoke with palliative care team and will be here by 4 pm to talk to pt and family further about goals of care  Dysphagia On dysphagia diet but unable to swallow anything. Aspiration precautions.  afib Rate currently controlled with diltiazem and digoxin along with eliquis but pt has been unable to take these pills for 2 days now. Explained this to family but family not ready to discontinue pill at present until pt seen by palliative care  Family/ staff Communication: reviewed care plan with patient and nursing supervisor  Goals of care: comfort care, palliative care consult   Labs/tests ordered: none for now    [facility], MD  [place] [phone] (Monday-Friday 8 am - 5 pm) [phone] (afterhours)

## 2014-08-26 NOTE — Telephone Encounter (Signed)
[facility] 

## 2014-08-27 ENCOUNTER — Telehealth: Payer: Self-pay | Admitting: Internal Medicine

## 2014-08-27 ENCOUNTER — Ambulatory Visit: Payer: Medicare Other | Admitting: Cardiology

## 2014-08-27 NOTE — Telephone Encounter (Signed)
Ok for verbal 

## 2014-08-27 NOTE — Telephone Encounter (Signed)
Hospice called and the patient would like to go home to die.  They only need a verbal from his PCP ok to be attending and hospice would take care of all other.  They did inform he is doing so poorly may not even make it home.  Call back number to [facility] [place].

## 2014-08-27 NOTE — Telephone Encounter (Signed)
Pt's wife states pt is declining and they would like to have pt at home under Hospice care.  Pt states Hospice needs an order faxed to the below number. Pt is currently at [facility].  (f) [phone] [facility]

## 2014-08-27 NOTE — Telephone Encounter (Signed)
Hospice informed of verbal ok.

## 2014-08-28 ENCOUNTER — Non-Acute Institutional Stay (SKILLED_NURSING_FACILITY): Payer: Medicare Other | Admitting: Adult Health

## 2014-08-28 DIAGNOSIS — I4891 Unspecified atrial fibrillation: Secondary | ICD-10-CM

## 2014-08-28 DIAGNOSIS — I6350 Cerebral infarction due to unspecified occlusion or stenosis of unspecified cerebral artery: Secondary | ICD-10-CM

## 2014-08-28 DIAGNOSIS — I639 Cerebral infarction, unspecified: Secondary | ICD-10-CM

## 2014-08-28 DIAGNOSIS — J449 Chronic obstructive pulmonary disease, unspecified: Secondary | ICD-10-CM

## 2014-09-02 NOTE — Progress Notes (Signed)
Patient ID: [name], male   DOB: January 30, 1927, 80 y.o.   MRN: [id number]     [facility]  Allergies  Allergen Reactions  . Azithromycin Other (See Comments)    Altered mental status  . Diazepam Other (See Comments)    REACTION: agitation  . Ezetimibe-Simvastatin Other (See Comments)    Unknown, wife thinks it's muscle pain  . Morphine Other (See Comments)    Hallucinations.  . Prednisone Other (See Comments)    Dizziness  . Sulfonamide Derivatives Hives     Chief Complaint  Patient presents with  . Discharge Note    HPI:  He is being discharged to home with hospice care. His family has decided his needs would be better served if he spent his last days of life at home. Hospice care has delivered his needed dme. He is not taking medications at this time; and will need prescription written for morphine.   Past Medical History  Diagnosis Date  . Bladder cancer     a. Dr. Lawerance Bach - [facility]. Multiple surgeries, chemo trial.  . Hyperlipidemia   . Anemia 04/10/2009  . Immune thrombocytopenic purpura     a. s/p splenectomy.  Marland Kitchen Anxiety   . Hypertension   . Permanent atrial fibrillation     a. Historically difficult to control rates  . COPD   . TIA (transient ischemic attack)     a. H/o TIA 2007.  . Stroke     a. H/o remote lacunar infarcts, at least 3 of them. b. Right lenticular nucleus and corona radiata infarct, cardioembolic 05/9677. c. Stroke 11/2013, new infarct 2 weeks later 12/2013.  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 Gross hematuria     a. H/o bladder CA with intermittent gross hematuria.  Marland Kitchen Dysphagia   . Anemia     Past Surgical History  Procedure Laterality Date  . Splenectomy    . Bilateral vats ablation    . Facial cancer      facial skin cancer    VITAL SIGNS BP 104/81  Pulse 73  Ht 6' (1.829 m)  Wt 123 lb (55.792 kg)  BMI 16.68 kg/m2   Patient's  Medications  New Prescriptions   No medications on file  Previous Medications   ALBUTEROL (PROVENTIL) (2.5 MG/3ML) 0.083% NEBULIZER SOLUTION    Take 3 mLs (2.5 mg total) by nebulization every 6 (six) hours.   AMBULATORY NON FORMULARY MEDICATION    Morphine Sul Sol 100/35ml Sig: Take 0.77ml by mouth every 6 hours as needed for pain/shorness of breath   AMIODARONE (PACERONE) 200 MG TABLET    Take 200 mg by mouth 2 (two) times daily. Present dose for 2 weeks then 200 mg daily after 2 weeks. Unknown start date   APIXABAN (ELIQUIS) 2.5 MG TABS TABLET    Take 2.5 mg by mouth 2 (two) times daily.   ATORVASTATIN (LIPITOR) 40 MG TABLET    Take 1 tablet (40 mg total) by mouth daily at 6 PM.   CHOLECALCIFEROL (VITAMIN D) 2000 UNITS TABLET    Take 2,000 Units by mouth daily.    DIGOXIN (LANOXIN) 0.125 MG TABLET    Take 1 tablet (0.125 mg total) by mouth daily.   DILTIAZEM (CARDIZEM CD) 180 MG 24 HR CAPSULE    Take 1 capsule (180 mg total) by mouth daily.   DOCUSATE SODIUM 100 MG CAPS    Take  200 mg by mouth 2 (two) times daily.   FOOD THICKENER (THICK IT) POWD    Take 1 Container by mouth as needed. HONEY THICK  Modified Medications   No medications on file  Discontinued Medications   No medications on file    SIGNIFICANT DIAGNOSTIC EXAMS   07-20-14: ct of head: 1. Stable age related cerebral atrophy, ventriculomegaly and periventricular white matter disease. 2. Remote lacunar-type basal ganglia infarcts an remote left PCA infarct. 3. No acute intracranial findings, mass lesion or skull fracture.  07-20-14: chest x-ray: Underlying emphysema with areas of lung scarring. No edema or consolidation.  07-25-14: mri of head: Atrophy and moderately severe chronic ischemic change. Chronic left occipital infarct as noted on the prior study. Small area of diffusion hyperintensity on the posterior aspect of this infarct. Favor chronic blood products versus a small area of acute infarct posterior to the  encephalomalacia.  07-29-14: chest x-ray: minimal basilar subsegmental atelectatic changes but otherwise negative for focal pneumonia. 2. Negative for chf.     LABS REVIEWED:   07-20-14: wbc 8.7; hgb 10.8; hct 32.1; mcv 89.7; plt 217; glucose 99; bun 21; creat 0.91; k+4.2; na++134;liver normal albumin 3.1; dig <0.3; Urine culture: 45,000 units: mixed bacteria 07-24-14; wbc 7.1; hgb 10.0; hct 30.1; mcv 88.5; lt 251; glucose 100 ;bun 22; creat 0.9; k+4.6; na++131  07-26-14: tsh 3.980; free t4: 1.9  07-30-14: wbc 5.4; hgb 9.6; hct 28.3; mcv 87.3; plt 357; glucose 97; bun 28; creat 1.01; k+4.7; na++132; liver normal albumin 2.8; inr 2.13; urine culture: pseudomonas aeruginosa and enterococcus species  07-31-14: wbc 5.2; hgb 9.5; hct 29.3; mcv 91.6; plt 326; glucose 83; bun 22; creat 0.94; k+4.4; na++135; chol 126; ldl 76; trig 40; hgb a1c 5.9  08-20-14: wbc 9.4; hgb 7.7; hct 24.7; mcv 91.1;plt 359; glucose 107; bun 41; creat 1.0; k+3.7; na++149 08-26-14: wbc 8.3; hgb 8.9; hct 28.3; mcv 90.7; plt 327; glucose 98; bun 30; creat 0.8; k+3.7; na++ 145; liver normal albumin 2.4      Review of Systems  Unable to perform ROS     Physical Exam  Constitutional: No distress.  frail  Eyes: Conjunctivae are normal. Pupils are equal, round, and reactive to light.  Neck: Neck supple. No JVD present. No thyromegaly present. no oral lesions  Cardiovascular: Normal rate, regular rhythm and intact distal pulses.   Heart rate regular   Respiratory: Effort normal and breath sounds normal. No respiratory distress. He has no wheezes.  GI: Soft. Bowel sounds are normal. He exhibits no distension. There is no tenderness.  Musculoskeletal: He exhibits no edema.  Is able to move right side extremities; is unable to move left extremities  has generalized weakness present.   Neurological: He is alert. speech is very difficult to understand Skin: Skin is warm and dry. He is not diaphoretic.      ASSESSMENT/  PLAN:   Will discharge him to home with hospice care. His dme equipment has been sent to his home via hospice. He will not need therapy as his status is terminal. His prescription for his roxanol has been written, the other medications he is not taking.    Time spent with patient 40 minutes.      Ok Edwards NP [facility]  Contact [phone] Monday through Friday 8am- 5pm  After hours call [phone]

## 2014-09-03 ENCOUNTER — Telehealth: Payer: Self-pay | Admitting: *Deleted

## 2014-09-03 NOTE — Telephone Encounter (Signed)
Left msg on triage stating wanting to inform md pt expired September 24, 2014 @ 4:49 am.../lmb

## 2014-09-05 ENCOUNTER — Telehealth: Payer: Self-pay | Admitting: Internal Medicine

## 2014-09-05 NOTE — Telephone Encounter (Signed)
Rec'd death certificate [id number] Dr. Jenny Reichmann signed and Mailed to [place] [facility] 09/05/2014

## 2014-09-18 ENCOUNTER — Ambulatory Visit: Payer: Medicare Other | Admitting: Neurology

## 2014-09-26 DEATH — deceased

## 2014-12-08 IMAGING — CR DG CHEST 1V PORT
1 series · 1 of 1 positions shown · non-contrast
Comparison: 07/30/2014

CLINICAL DATA: Fever

EXAM:
PORTABLE CHEST - 1 VIEW

[ap]
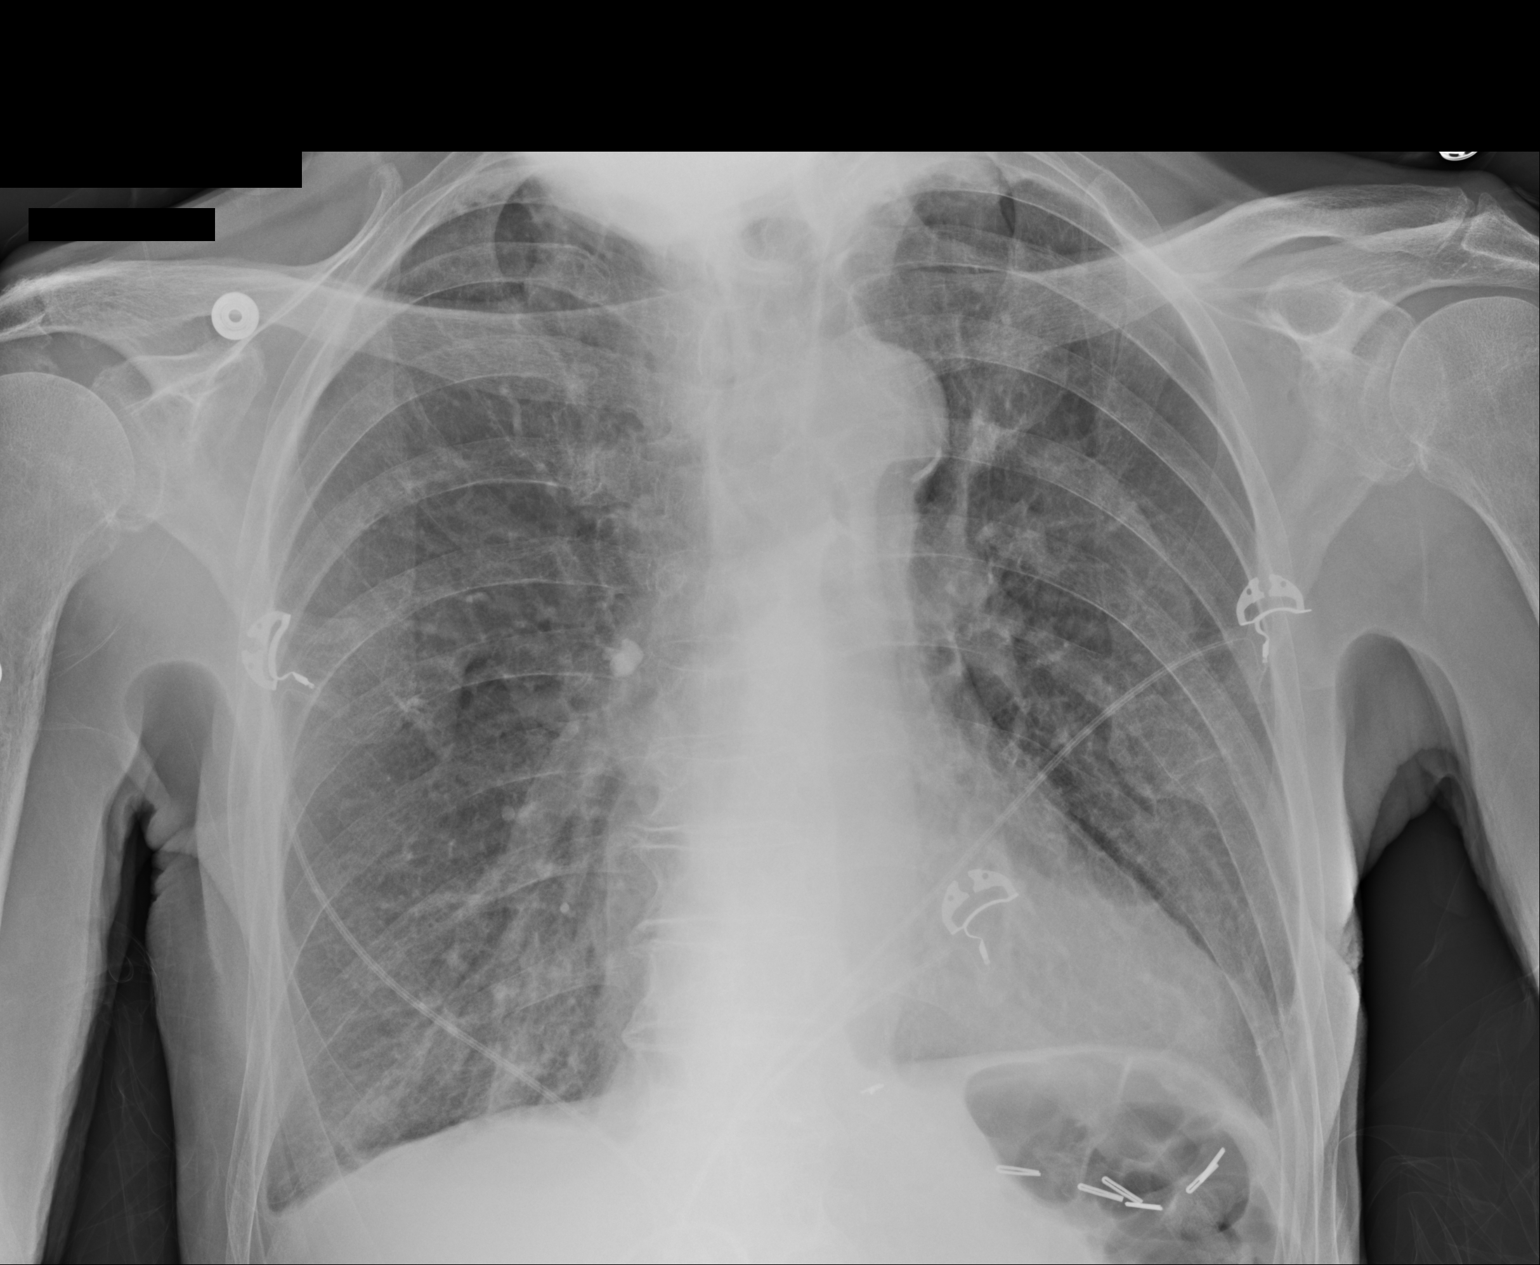

[1 of 1 positions shown; findings below may reference images not displayed]

FINDINGS: Diffuse interstitial edema and vascular congestion have developed.
Mild cardiomegaly. No pneumothorax. No pleural effusion. Chronic
pleural changes.
IMPRESSION: New development of interstitial edema.
